# Patient Record
Sex: Female | Born: 1955
Health system: Southern US, Community
[De-identification: ages and names within clinical notes are randomized; demographics above are authoritative.]

## PROBLEM LIST (undated history)

## (undated) DIAGNOSIS — Z5189 Encounter for other specified aftercare: Secondary | ICD-10-CM

## (undated) DIAGNOSIS — Z923 Personal history of irradiation: Secondary | ICD-10-CM

## (undated) DIAGNOSIS — G4730 Sleep apnea, unspecified: Secondary | ICD-10-CM

## (undated) DIAGNOSIS — K219 Gastro-esophageal reflux disease without esophagitis: Secondary | ICD-10-CM

## (undated) DIAGNOSIS — E785 Hyperlipidemia, unspecified: Secondary | ICD-10-CM

## (undated) DIAGNOSIS — E119 Type 2 diabetes mellitus without complications: Secondary | ICD-10-CM

## (undated) DIAGNOSIS — T7840XA Allergy, unspecified, initial encounter: Secondary | ICD-10-CM

## (undated) DIAGNOSIS — I2510 Atherosclerotic heart disease of native coronary artery without angina pectoris: Secondary | ICD-10-CM

## (undated) DIAGNOSIS — C801 Malignant (primary) neoplasm, unspecified: Secondary | ICD-10-CM

## (undated) DIAGNOSIS — E669 Obesity, unspecified: Secondary | ICD-10-CM

## (undated) DIAGNOSIS — I10 Essential (primary) hypertension: Secondary | ICD-10-CM

## (undated) DIAGNOSIS — R011 Cardiac murmur, unspecified: Secondary | ICD-10-CM

## (undated) DIAGNOSIS — Z9221 Personal history of antineoplastic chemotherapy: Secondary | ICD-10-CM

## (undated) DIAGNOSIS — IMO0002 Reserved for concepts with insufficient information to code with codable children: Secondary | ICD-10-CM

## (undated) DIAGNOSIS — C50919 Malignant neoplasm of unspecified site of unspecified female breast: Secondary | ICD-10-CM

## (undated) DIAGNOSIS — IMO0001 Reserved for inherently not codable concepts without codable children: Secondary | ICD-10-CM

## (undated) DIAGNOSIS — F319 Bipolar disorder, unspecified: Secondary | ICD-10-CM

## (undated) DIAGNOSIS — M1990 Unspecified osteoarthritis, unspecified site: Secondary | ICD-10-CM

## (undated) HISTORY — DX: Encounter for other specified aftercare: Z51.89

## (undated) HISTORY — DX: Reserved for concepts with insufficient information to code with codable children: IMO0002

## (undated) HISTORY — PX: ABDOMINAL HYSTERECTOMY: SHX81

## (undated) HISTORY — PX: CARDIAC CATHETERIZATION: SHX172

## (undated) HISTORY — PX: EYE SURGERY: SHX253

## (undated) HISTORY — DX: Type 2 diabetes mellitus without complications: E11.9

## (undated) HISTORY — DX: Unspecified osteoarthritis, unspecified site: M19.90

## (undated) HISTORY — DX: Hyperlipidemia, unspecified: E78.5

## (undated) HISTORY — DX: Essential (primary) hypertension: I10

## (undated) HISTORY — DX: Malignant (primary) neoplasm, unspecified: C80.1

## (undated) HISTORY — PX: CORONARY ANGIOPLASTY: SHX604

## (undated) HISTORY — DX: Reserved for inherently not codable concepts without codable children: IMO0001

## (undated) HISTORY — PX: CHOLECYSTECTOMY: SHX55

## (undated) HISTORY — DX: Bipolar disorder, unspecified: F31.9

---

## 1999-07-19 HISTORY — PX: CORONARY STENT PLACEMENT: SHX1402

## 2001-04-29 ENCOUNTER — Emergency Department (HOSPITAL_COMMUNITY): Admission: EM | Admit: 2001-04-29 | Discharge: 2001-04-29 | Payer: Self-pay | Admitting: Emergency Medicine

## 2001-05-17 ENCOUNTER — Encounter: Admission: RE | Admit: 2001-05-17 | Discharge: 2001-08-15 | Payer: Self-pay | Admitting: Internal Medicine

## 2001-08-15 ENCOUNTER — Encounter: Admission: RE | Admit: 2001-08-15 | Discharge: 2001-11-13 | Payer: Self-pay | Admitting: Internal Medicine

## 2001-11-11 ENCOUNTER — Encounter: Payer: Self-pay | Admitting: Emergency Medicine

## 2001-11-11 ENCOUNTER — Inpatient Hospital Stay (HOSPITAL_COMMUNITY): Admission: EM | Admit: 2001-11-11 | Discharge: 2001-11-13 | Payer: Self-pay | Admitting: Emergency Medicine

## 2001-11-17 ENCOUNTER — Inpatient Hospital Stay (HOSPITAL_COMMUNITY): Admission: EM | Admit: 2001-11-17 | Discharge: 2001-11-22 | Payer: Self-pay | Admitting: Emergency Medicine

## 2001-11-17 ENCOUNTER — Encounter: Payer: Self-pay | Admitting: Cardiology

## 2001-11-20 ENCOUNTER — Encounter: Payer: Self-pay | Admitting: Cardiovascular Disease

## 2001-11-27 ENCOUNTER — Encounter: Admission: RE | Admit: 2001-11-27 | Discharge: 2002-02-25 | Payer: Self-pay | Admitting: Internal Medicine

## 2001-11-27 ENCOUNTER — Encounter (HOSPITAL_COMMUNITY): Admission: RE | Admit: 2001-11-27 | Discharge: 2002-02-25 | Payer: Self-pay | Admitting: Cardiovascular Disease

## 2001-12-04 ENCOUNTER — Emergency Department (HOSPITAL_COMMUNITY): Admission: EM | Admit: 2001-12-04 | Discharge: 2001-12-04 | Payer: Self-pay | Admitting: Emergency Medicine

## 2001-12-14 ENCOUNTER — Encounter: Admission: RE | Admit: 2001-12-14 | Discharge: 2002-03-14 | Payer: Self-pay | Admitting: Internal Medicine

## 2002-01-30 ENCOUNTER — Ambulatory Visit (HOSPITAL_COMMUNITY): Admission: RE | Admit: 2002-01-30 | Discharge: 2002-01-30 | Payer: Self-pay | Admitting: Neurology

## 2002-01-30 ENCOUNTER — Encounter: Payer: Self-pay | Admitting: Neurology

## 2002-02-21 ENCOUNTER — Encounter: Admission: RE | Admit: 2002-02-21 | Discharge: 2002-05-22 | Payer: Self-pay | Admitting: Otolaryngology

## 2002-02-26 ENCOUNTER — Encounter (HOSPITAL_COMMUNITY): Admission: RE | Admit: 2002-02-26 | Discharge: 2002-05-27 | Payer: Self-pay | Admitting: Cardiovascular Disease

## 2002-03-04 ENCOUNTER — Inpatient Hospital Stay (HOSPITAL_COMMUNITY): Admission: EM | Admit: 2002-03-04 | Discharge: 2002-03-06 | Payer: Self-pay | Admitting: Emergency Medicine

## 2002-03-04 ENCOUNTER — Encounter: Payer: Self-pay | Admitting: *Deleted

## 2002-03-12 ENCOUNTER — Encounter: Admission: RE | Admit: 2002-03-12 | Discharge: 2002-04-11 | Payer: Self-pay | Admitting: Internal Medicine

## 2002-05-30 ENCOUNTER — Encounter: Payer: Self-pay | Admitting: Emergency Medicine

## 2002-05-30 ENCOUNTER — Observation Stay (HOSPITAL_COMMUNITY): Admission: EM | Admit: 2002-05-30 | Discharge: 2002-05-31 | Payer: Self-pay | Admitting: Emergency Medicine

## 2002-07-30 ENCOUNTER — Emergency Department (HOSPITAL_COMMUNITY): Admission: EM | Admit: 2002-07-30 | Discharge: 2002-07-30 | Payer: Self-pay | Admitting: Emergency Medicine

## 2002-07-30 ENCOUNTER — Encounter: Payer: Self-pay | Admitting: Emergency Medicine

## 2002-08-16 ENCOUNTER — Encounter: Payer: Self-pay | Admitting: Internal Medicine

## 2002-08-16 ENCOUNTER — Encounter: Admission: RE | Admit: 2002-08-16 | Discharge: 2002-08-16 | Payer: Self-pay | Admitting: Internal Medicine

## 2002-09-06 ENCOUNTER — Encounter: Payer: Self-pay | Admitting: Emergency Medicine

## 2002-09-06 ENCOUNTER — Inpatient Hospital Stay (HOSPITAL_COMMUNITY): Admission: EM | Admit: 2002-09-06 | Discharge: 2002-09-07 | Payer: Self-pay | Admitting: Emergency Medicine

## 2003-05-14 ENCOUNTER — Emergency Department (HOSPITAL_COMMUNITY): Admission: EM | Admit: 2003-05-14 | Discharge: 2003-05-14 | Payer: Self-pay | Admitting: Emergency Medicine

## 2003-10-08 ENCOUNTER — Encounter: Admission: RE | Admit: 2003-10-08 | Discharge: 2003-10-08 | Payer: Self-pay | Admitting: Internal Medicine

## 2003-12-31 ENCOUNTER — Ambulatory Visit (HOSPITAL_COMMUNITY): Admission: RE | Admit: 2003-12-31 | Discharge: 2003-12-31 | Payer: Self-pay | Admitting: Orthopedic Surgery

## 2003-12-31 ENCOUNTER — Ambulatory Visit (HOSPITAL_BASED_OUTPATIENT_CLINIC_OR_DEPARTMENT_OTHER): Admission: RE | Admit: 2003-12-31 | Discharge: 2003-12-31 | Payer: Self-pay | Admitting: Orthopedic Surgery

## 2004-10-20 ENCOUNTER — Encounter: Admission: RE | Admit: 2004-10-20 | Discharge: 2004-10-20 | Payer: Self-pay | Admitting: Internal Medicine

## 2004-12-13 ENCOUNTER — Emergency Department (HOSPITAL_COMMUNITY): Admission: EM | Admit: 2004-12-13 | Discharge: 2004-12-14 | Payer: Self-pay | Admitting: Emergency Medicine

## 2004-12-14 ENCOUNTER — Emergency Department (HOSPITAL_COMMUNITY): Admission: EM | Admit: 2004-12-14 | Discharge: 2004-12-14 | Payer: Self-pay | Admitting: Emergency Medicine

## 2005-03-10 ENCOUNTER — Observation Stay (HOSPITAL_COMMUNITY): Admission: EM | Admit: 2005-03-10 | Discharge: 2005-03-11 | Payer: Self-pay | Admitting: Emergency Medicine

## 2005-04-06 ENCOUNTER — Emergency Department (HOSPITAL_COMMUNITY): Admission: EM | Admit: 2005-04-06 | Discharge: 2005-04-07 | Payer: Self-pay | Admitting: Emergency Medicine

## 2005-10-21 ENCOUNTER — Encounter: Admission: RE | Admit: 2005-10-21 | Discharge: 2005-10-21 | Payer: Self-pay | Admitting: Internal Medicine

## 2007-01-29 ENCOUNTER — Encounter: Admission: RE | Admit: 2007-01-29 | Discharge: 2007-01-29 | Payer: Self-pay | Admitting: Internal Medicine

## 2008-03-21 ENCOUNTER — Encounter: Admission: RE | Admit: 2008-03-21 | Discharge: 2008-03-21 | Payer: Self-pay | Admitting: Internal Medicine

## 2009-04-03 ENCOUNTER — Encounter: Admission: RE | Admit: 2009-04-03 | Discharge: 2009-04-03 | Payer: Self-pay | Admitting: Internal Medicine

## 2009-05-08 ENCOUNTER — Encounter (INDEPENDENT_AMBULATORY_CARE_PROVIDER_SITE_OTHER): Payer: Self-pay | Admitting: Internal Medicine

## 2009-05-08 ENCOUNTER — Encounter: Admission: RE | Admit: 2009-05-08 | Discharge: 2009-05-08 | Payer: Self-pay | Admitting: Internal Medicine

## 2009-05-08 HISTORY — PX: BREAST BIOPSY: SHX20

## 2010-07-30 ENCOUNTER — Encounter
Admission: RE | Admit: 2010-07-30 | Discharge: 2010-07-30 | Payer: Self-pay | Source: Home / Self Care | Attending: Internal Medicine | Admitting: Internal Medicine

## 2010-12-03 NOTE — Discharge Summary (Signed)
NAME:  Kathy Maxwell, Kathy Maxwell NO.:  [id number]   MEDICAL RECORD NO.:  [id number]                   PATIENT TYPE:  INP   LOCATION:  3733                                 FACILITY:  [facility]   PHYSICIAN:  Richard A. Alanda Amass, M.D.          DATE OF BIRTH:  16-Nov-1955   DATE OF ADMISSION:  03/04/2002  DATE OF DISCHARGE:  03/06/2002                                 DISCHARGE SUMMARY   DISCHARGE DIAGNOSES:  1. Chest pain consistent with unstable angina, no restenosis of previously     placed LAD Stent by catheterization this admission.  2. Coronary artery disease, LAD cipher Stent placed in April of 2003 with     abnormal Cardiolite in June of 2003.  3. Breast attenuation versus ischemia.  4. Controlled hypertension.  5. Treated hyperlipidemia.  6. Non-insulin dependent diabetes mellitus.  7. Bipolar disorder.   HOSPITAL COURSE:  The patient is a 55 year old female followed by Dr.  Alanda Amass and Dr. Allyne Gee with a history of LAD stenting in April of 2003.  Follow-up Cardiolite study in June of 2003 was abnormal with suggestion of  anterior wall ischemia versus breast attenuation. At that time, she was  doing well and we continued her on medical therapy. She presented to the  office on March 04, 2002 with complaints of chest pain, described as sharp,  off and on for three weeks. She had pain at cardiac rehabilitation and was  referred to [name] by cardiac rehabilitation. She took Nitroglycerin times two  with eventual relief. She says that her symptoms are similar to what she had  had pre-PCI in April.   HOSPITAL COURSE:  The patient was seen by Dr. Jenne Campus and myself and  admitted to [facility] and started on Heparin and Nitroglycerin.  Set up for a re-study. CK, MB and troponin's were negative. She underwent  diagnostic catheterization on March 05, 2002 by Dr. Tresa Endo which revealed  normal RCA, normal circumflex, 40% mid LAD narrowing with intravascular  bridging and no re-stenosis of the distal LAD Stent site. LV function was  normal. Dr. Tresa Endo suggested continuing nitrates with intramyocardial  bridging. She is for discharge on March 06, 2002.   DISCHARGE MEDICATIONS:  1. Imdur 30 mg per day.  2. Toprol XL 37.5 mg daily.  3. Lamictal 200 mg bid.  4. Wellbutrin 150 mg bid.  5. Zocor 40 mg HS.  6. Nexium 40 mg daily.  7. Actos 45 mg daily.  8. Aspirin 81 mg daily.  9. Seroquel 400 mg at HS.  10.      Glucophage 750 mg daily.   LABORATORY DATA:  Sodium 140, potassium 3.6, BUN 13, creatinine 0.9, TSH  0.65. CK, MB and troponin's were negative. Lipid profile reveals a total  cholesterol of 161, HDL of 57, LDL 88, white count 5.9, hemoglobin 11.2 and  hematocrit 33.8. Platelet count 297. INR 0.9.   DIAGNOSTIC STUDIES:  EKG revealed sinus rhythm with first degree AV block  and nonspecific ST changes.   DISPOSITION:  The patient was discharged in stable condition and will follow-  up with Dr Alanda Amass in a few months.     Abelino Derrick, P.A.                      Richard A. Alanda Amass, M.D.    Lenard Lance  D:  03/06/2002  T:  03/08/2002  Job:  [id number]   cc:   Gerlene Burdock A. Alanda Amass, M.D.   Candyce Churn. Allyne Gee, M.D.

## 2010-12-03 NOTE — Discharge Summary (Signed)
NAME:  Kathy Maxwell, Kathy Maxwell                          ACCOUNT NO.:  [id number]   MEDICAL RECORD NO.:  [id number]                   PATIENT TYPE:  INP   LOCATION:  4735                                 FACILITY:  [facility]   PHYSICIAN:  Richard A. Alanda Amass, M.D.          DATE OF BIRTH:  March 11, 1956   DATE OF ADMISSION:  DATE OF DISCHARGE:  09/07/2002                                 DISCHARGE SUMMARY   DISCHARGE DIAGNOSES:  1. Chest pain, noncardiac.  2. Coronary artery disease.  3. Gastroesophageal reflux disease.  4. Diabetes mellitus.  5. Hypertension.  6. Bipolar disorder.  7. Dyslipidemia.  8. Sleep apnea with positive sleep study.   CONDITION ON DISCHARGE:  Improved.   PROCEDURES:  None.   DISCHARGE MEDICATIONS:  1. Chlorthalidone 25 mg every other day.  2. Potassium chloride 10 mEq every other day.  3. Wellbutrin SR 150 mg b.i.d.  4. Seroquel 400 mg q.h.s.  5. Glucophage XL 750 mg daily.  6. Aspirin 81 mg daily.  7. Imdur 60 mg 1 daily.  8. Actos 45 mg daily.  9. Nexium 40 mg daily.  10.      Toprol  XL 37.5 mg daily.  11.      Zocor 40 mg daily.  12.      Remittor 200 mg b.i.d.  13.      Nitroglycerin p.r.n. chest pain.  14.      Take 2 potassium pills tonight at supper 1 time only.   DISCHARGE INSTRUCTIONS:  No strenuous activity until you see Dr. Alanda Amass.  Low fat, low salt diabetic diet.   FOLLOW UP:  Followup with Dr. Alanda Amass in 2 to 3 weeks, call for  appointment date and time.   HISTORY OF PRESENT ILLNESS:  The patient is a 55 year old African American  female with a prior history of coronary disease including a Cypher stent to  the LAD in April 2003 and a repeat catheterization in August 2003 revealed  no restenosis. She came to the emergency room on September 06, 2002, after  having  a few episodes of nitroglycerin responsive chest pain associated  with nausea. The night  prior to  admission she had this. The morning of  admission she had 2 episodes of  chest pain associated with shortness of  breath but no radiation or dizziness. She states it felt very much like the  pain she had in August 2003 when her stent was put in. She was admitted for  observation at [facility] to rule out MI.   PAST MEDICAL HISTORY:  1. Cypher stent as stated.  2. Type 2 diabetes mellitus.  3. Gastroesophageal reflux disease.  4. Hypertension.  5. Dyslipidemia.  6. Bipolar.   For family history, social history, review of systems see history and  physical.   OUTPATIENT MEDICATIONS:  1. Chlorthalidone.  2. Potassium.  3. Wellbutrin.  4. Seroquel.  5. Glucophage.  6. Aspirin.  7. Imdur.  8. Actos.  9. Nexium.  10.      Toprol.  11.      Zocor.  12.      Limitrol.   CONDITION ON DISCHARGE:  Vital signs at discharge:  Blood pressure 124/76,  pulse 60, respirations 16, temperature 97.2. Lungs are clear. Heart sounds,  S1, S2, regular rate and rhythm.   LABORATORY DATA:  Hemoglobin on admission 11.0, hematocrit 34.8, WBC 5.4,  MCV 90, platelets 307, neutrophils 53, lymphocytes 40, monocytes 5,  eosinophils 3, basophils 1. Pro time 13.4, INR 1, PTT 33. Sodium 140,  potassium 3.4, chloride 105, CO2 28, glucose 122, BUN 13, creatinine 0.9,  calcium 9.5. Total protein 6.8, albumin 3.6, AST 15, ALT 21, ALP 100, total  bilirubin 0.4. Potassium prior to  discharge had been 3.2. She was given  extra potassium and asked to take extra potassium on the night of discharge.  CK-MB 1 to 2, 103, 98. MB 1.5, 1.2, 1.1. Troponin 0.01 to less than  0.01,  all negative for MI.   A chest x-ray suboptimal inspiration, no active disease. An EKG, sinus  rhythm, first degree AV block, PR was 304 msec, nonspecific T-wave  abnormality.   HOSPITAL COURSE:  The patient was admitted by Dr. Tresa Endo on September 06, 2002, after having nitroglycerin responsive chest pain. She was admitted and  placed on Vioxx, Protonix and just observed overnight. By the next day she   had no further chest pain. Her potassium was replaced. She walked in the  hall without chest pain and was ready  for discharge. She was very sleepy on original discharge. When she woke up  and could walk in the hall without problems she was discharged to home.  She was to follow up as stated.     Darcella Gasman. [place], N.P.                     Richard A. Alanda Amass, M.D.    LRI/MEDQ  D:  10/18/2002  T:  10/21/2002  Job:  [id number]   cc:   Candyce Churn. Allyne Gee, M.D.  [address]  [address]  [place]  [place] [address]  Fax: [phone]

## 2010-12-03 NOTE — Cardiovascular Report (Signed)
[place]. [facility]  Patient:    Kathy Maxwell, Kathy Maxwell Visit Number: [id number] MRN: [id number]          Service Type: MED Location: [phone] Attending Physician:  Ruta Hinds Dictated by:   Madaline Savage, M.D. Proc. Date: 11/12/01 Admit Date:  11/12/2001   CC:         Cala Bradford R. Renae Gloss, M.D.  Cardiopulmonary Laboratory   Cardiac Catheterization  PROCEDURES PERFORMED: 1. Selective coronary angiography by Judkins technique. 2. Retrograde left heart catheterization. 3. Left ventricular angiography. 4. Abdominal aortography. 5. Direct coronary artery stenting to the distal left anterior descending.  COMPLICATIONS: None.  ENTRY SITE: Right femoral.  DYE USED: Omnipaque.  PATIENT PROFILE: The patient is a 55 year old, African American female with a family history of coronary artery disease in a younger sibling, who had myocardial infarction, who presented with onset of chest pain approximately two to three days before admission. Cardiac enzymes were negative. ECG showed normal sinus rhythm with left ventricular hypertrophy and nonspecific ST-T abnormalities. The patient has been diabetic since 1995. She is also obese. She has had elevated cholesterol. The patient was transferred from [facility] to [facility] today for diagnostic cardiac catheterization, which ultimately led to a coronary artery intervention.  RESULTS:  PRESSURES: The left ventricular pressure was 95/15, central aortic pressure 95/60, mean of 75. No aortic valve gradient by pullback techniques.  ANGIOGRAPHIC RESULTS: The left main coronary artery was normal and fairly short.  The LAD coursed to the cardiac apex near the origin of a first septal perforator branch, a diagonal branch arose which was medium in size. No lesions were seen in the diagonal or in the proximal LAD. The mid LAD contained a 40% discrete lesion that was very smooth. The distal  LAD contained an 80% eccentric stenosis about 25 mm away from the bifurcation of the LAD at the apex. This was a eccentric lesion about 10 mm in length.  The circumflex coronary artery was nondominant. It gave rise to a first obtuse marginal branch which bifurcated in its midportion. The circumflex and the OM showed no significant lesions.  The right coronary artery was a dominant vessel. It contained a 40% stenosis in the midportion of the vessel near an acute marginal branch. The distal RCA was normal. The posterior descending and the posterolateral branch were both normal.  The left ventricle showed low-normal contractility of all wall segments with an ejection fraction estimated of 50%. There was no LV thrombus and no mitral regurgitation seen. Abdominal aortography showed both renal arteries to be widely patent, the abdominal aorta to be smooth and no significant pathology was seen.  INTERVENTIONAL PROCEDURE: The interventional procedure was performed using a 3.0, 7 French left Judkins guide catheter without side holes. The guide wire utilized was a 0.014 Patriot guide wire. There was no pre-dilatation balloon. A 2.5 x 18 mm Cypher RX Cordis stent was placed in the distal LAD and I was careful to place the ends of the stent on areas of arterial vessel wall, which appeared smooth and normal in appearance. I then deployed the stent balloon twice to 14 atmospheres each time. The first time for a minute and 15 seconds. The second time for 45 seconds. No ST segment changes occurred. The vessel was then infused with 200 mcg of intracoronary nitroglycerin to dilate it maximally and observe the response. The 80% lesion eccentrically in the distal LAD was reduced to 0% residual. There was a  smooth lumen throughout the vessel distally, and the result was considered very successful. The 80% lesion was reduced to 0% residual.  MEDICATION GIVEN DURING THE PROCEDURE: Fentanyl for sedation,  intravenous ReoPro for platelet stabilization. The patient will be given Plavix and was also given regular insulin subcutaneous 10 units after her blood sugar was found to be in excess of 400. The last blood sugar was 318.  FINAL DIAGNOSES: 1. Two-vessel coronary artery disease.    a. An 80% distal left anterior descending stenosis.    b. A 40% mid left anterior descending stenosis.    c. A 40% mid right coronary artery stenosis. 2. Low-normal left ventricular systolic function, 50% ejection fraction. 3. Normal renal arteries and normal abdominal aorta. 4. Successful stenting of distal left anterior descending with a coated    sirolimus Cypher stent by Cordis.  PLAN: The patient will need Plavix for two months and her blood sugar will be addressed this evening and tomorrow before discharge. She will receive ReoPro for a total of 16 hours. Dictated by:   Madaline Savage, M.D. Attending Physician:  Ruta Hinds DD:  11/12/01 TD:  11/13/01 Job: [id number]

## 2010-12-03 NOTE — Discharge Summary (Signed)
[place]. [facility]  Patient:    Kathy Maxwell, Kathy Maxwell Visit Number: [id number] MRN: [id number]          Service Type: MED Location: [phone] Attending Physician:  Ruta Hinds Dictated by:   Abelino Derrick, P.A.C. Admit Date:  11/12/2001 Discharge Date: 11/13/2001                             Discharge Summary  DISCHARGE DIAGNOSES: 1. Unstable angina, elective left anterior descending artery stenting this    admission. 2. Hypertension, controlled. 3. Hyperlipidemia, treated. 4. Type 2 diabetes. 5. Bipolar disorder.  HOSPITAL COURSE:  The patient is a 55 year old female followed by Dr. Allyne Gee who was admitted on November 11, 2001 with chest pain consistent with unstable angina  Enzymes were initially negative.  EKG shows LVH and nonspecific ST changes.  She was treated for unstable angina with IV heparin and nitrates. She was set up for catheterization which was done November 12, 2001, by Dr. Elsie Lincoln.  This reveals 40% RCA narrowing, normal circumflex, normal OM, normal left main, and an 80% mid LAD that was dilated and stented.  Renal arteries, iliac arteries, and aorta were within normal limits.  She tolerated the procedure well.  We fell she can be discharged November 13, 2001.  The patient was transferred to Dr. Reino Kent service as of November 12, 2001.  DISCHARGE MEDICATIONS:  1. Seroquel 400 mg h.s.  2. Plavix 75 mg a day for 4 weeks.  3. Coated aspirin q.d.  4. GlucoVance 5/500 two tablets twice a day.  This will be held until the     patient follows up with Dr. Allyne Gee on Thursday, May 1.  5. Glucotrol XL 10 mg a day.  She is to take this until she sees Dr. Allyne Gee.  6. Zocor 20 mg a day.  7. Nexium 40 mg a day.  8. Accupril 20 mg a day.  9. Actos 30 mg twice a day. 10. Estratest h.s. 11. ______  200 mg twice a day. 12. Triamterene/hydrochlorothiazide once a day. 13. Wellbutrin twice a day. 14. Nitroglycerin sublingual  p.r.n.  LABORATORY DATA:  Sodium 136, potassium 4.2, BUN 10, creatinine 0.8.  White count 5.6, hemoglobin 12, hematocrit 35.3, platelets unable to be estimated because of clumps on smears, but overall platelet count appeared to be adequate.  Troponin and CK were negative.  Liver function normal.  EKG shows sinus rhythm, first degree A-V block with a PR interval of 250.  DISPOSITION:  The patient is discharged in stable condition and will follow up with Dr. Alanda Amass, who saw her initially in consult, May 22 at 4:15 p.m.  She has an appointment with Dr. Allyne Gee on May 1 at 10 a.m. for BMET.  She will hold GlucoVance until she is seen by Dr. Allyne Gee and in the interim take Glucotrol XL 10 mg a day.  Beta blocker was not started; her overall heart rate was in the 60s, and she had a significant first degree A-V block. Dictated by:   Abelino Derrick, P.A.C. Attending Physician:  Ruta Hinds DD:  11/13/01 TD:  11/13/01 Job: [id number]

## 2010-12-03 NOTE — H&P (Signed)
NAME:  BRITTINEY, DICOSTANZO NO.:  [id number]   MEDICAL RECORD NO.:  [id number]          PATIENT TYPE:  INP   LOCATION:  1823                         FACILITY:  [facility]   PHYSICIAN:  Ulyses Amor, MD DATE OF BIRTH:  Oct 29, 1955   DATE OF ADMISSION:  03/10/2005  DATE OF DISCHARGE:                                HISTORY & PHYSICAL   HISTORY OF PRESENT ILLNESS:  Kathy Maxwell is a 55 year old black woman who  is admitted to [facility] for further evaluation of chest pain.   The patient has a history of coronary artery disease, which dates back to  April of 2003.  At that time, she underwent stenting of an LAD lesion.  In  August of 2003, she returned for chest pain.  Cardiac catheterization  demonstrated that the stent was patent.  There was a 40% mid LAD lesion with  intravascular bridging noted.  Continued medical therapy was advised.   The patient presented to the emergency department this evening after  experiencing intermittent chest pain, which began approximately 12 hours  ago.  She was at home at the time.  The chest pain is described as an ache  in a focal region in the mid substernal.  It has radiated to the right  shoulder.  The chest pain has occurred intermittently over these 12 hours,  lasting for several minutes, resolving, and then returning for several more  minutes.  There appear to be no exacerbating or ameliorating factors.  The  chest pain is unrelated to position, activity, meals, or respirations.  There was no associated dyspnea or diaphoresis, but she has noted some  nausea.  Nitroglycerin given by EMS seemed to help her pain.  Her chest pain  has diminished, though not completely resolved, since her arrival in the  emergency department.   There is no history of congestive heart failure or arrhythmia.   RISK FACTORS:  The patient has a number of risk factors for coronary artery  disease, including hypertension, dyslipidemia,  noninsulin-dependent diabetes  mellitus, and a family history of coronary artery disease (mother and  sister).  There is no history of smoking.   The patient's only other medical problem is bipolar disorder.   MEDICATIONS:  1.  Wellbutrin.  2.  Actos.  3.  Seroquel.  4.  Nexium.  5.  Toprol-XL.  6.  Imdur.  7.  Nitroglycerin.  8.  Lexapro.   ALLERGIES:  None.   OPERATIONS:  Gallbladder surgery.   SOCIAL HISTORY:  The patient lives alone.  She does not work.  She does not  smoke cigarettes, as noted above.   FAMILY HISTORY:  Her mother died in her 59s of complications of diabetes and  CAD.  Her sister died in her 40s of a myocardial infarction.   REVIEW OF SYSTEMS:  No new problems related to her head, eyes, ears, nose,  mouth, throat, lungs, gastrointestinal system, genitourinary system, or  extremities.  There is no history of neurologic disorder.  There is no  history of fever, chills, or weight loss.   PHYSICAL EXAMINATION:  VITAL SIGNS:  Blood pressure 152/71, pulse 93 and  regular, respirations 24, temperature 98.4.  GENERAL:  The patient was an obese, middle-aged black woman in no  discomfort.  She was alert, oriented, appropriate, and responsive.  HEENT:  Normal.  NECK:  Without thyromegaly or adenopathy.  Carotid pulses were palpable  bilaterally and without bruits.  CARDIAC:  A normal S1 and S2.  There was no S3, S4, murmur, rub, or click.  Cardiac rhythm was regular.  CHEST:  Palpation of the sternum reproduced the patient's chest pain.  LUNGS:  Clear.  ABDOMEN:  Soft and nontender.  There was no mass, hepatosplenomegaly, bruit,  distention, rebound, guarding, or rigidity.  Bowel sounds were normal.  BREASTS/PELVIC/RECTAL:  Not performed, as they were not pertinent to the  reason for acute care hospitalization.  EXTREMITIES:  Without edema, deviation, or deformity.  Radial and dorsalis  pedis pulses were palpable bilaterally.  NEUROLOGIC:  Brief screening  neurologic survey was unremarkable.   The electrocardiogram revealed normal sinus rhythm.  The possibility of a  prior septal myocardial infarction could not be excluded.  There was a  nonspecific ST-T wave abnormality.  The chest radiograph, according to the  radiologist, demonstrated no evidence of acute disease.  The first set of  cardiac markers revealed a CK-MB of less than 1.0, myoglobin 37.3, and  troponin less than 0.05.  The second set of cardiac markers revealed a CK-MB  of less than 1.0, myoglobin 31.4, and troponin less than 0.05.  The third  set revealed a CK-MB of less than 1.0, myoglobin 26.9, and troponin less  than 0.05.  Potassium is 3.7, BUN 15, and creatinine 0.7.  The remaining  studies were pending at the time of this dictation.   IMPRESSION:  1.  Chest pain, rule out cardiac ischemia.  2.  Coronary artery disease, status post LAD stent in April 2003.  Her last      cardiac catheterization, performed in August of 2003, demonstrated a      patent stent, a 40% mid LAD lesion with intravascular bridging, and      normal left ventricular function.  3.  Hypertension.  4.  Dyslipidemia.  5.  Noninsulin-dependent diabetes mellitus.  6.  Bipolar disorder.   PLAN:  1.  Telemetry.  2.  Serial cardiac enzymes.  3.  Aspirin.  4.  Intravenous nitroglycerin.  5.  Intravenous heparin.  6.  Toprol-XL.  7.  Further measures per Dr. Alanda Amass.      Ulyses Amor, MD  Electronically Signed     MSC/MEDQ  D:  03/10/2005  T:  03/10/2005  Job:  [id number]   cc:   Gerlene Burdock A. Alanda Amass, M.D.  [phone]  [place]  [place] [address]  Fax: [phone]

## 2010-12-03 NOTE — H&P (Signed)
NAME:  Kathy Maxwell, Kathy Maxwell NO.:  [id number]   MEDICAL RECORD NO.:  [id number]                   PATIENT TYPE:  INP   LOCATION:  3733                                 FACILITY:  [facility]   PHYSICIAN:  Abelino Derrick, P.A.                DATE OF BIRTH:  28-Nov-1955   DATE OF ADMISSION:  03/04/2002  DATE OF DISCHARGE:  03/06/2002                                HISTORY & PHYSICAL   ATTENDING PHYSICIAN:  Dr. Darlin Priestly   DATE OF ADMISSION:  March 04, 2002   CHIEF COMPLAINT:  Chest pain.   HISTORY OF PRESENT ILLNESS:  The patient is a 55 year old female followed by  Dr. Madaline Savage and Dr. Dorothyann Peng, with a history of coronary  artery disease.  She had an LAD stent placed in April 2003.  This was a  Cypher stent.  She was on Plavix for one month.  She had a Cardiolite study  in June 2003, which was abnormal, with a suggestion of anterior wall  ischemia versus breast attenuation.  At that time she was doing well  symptomatically.  She is seen in our office today with complaints of chest  pain described as sharp.  She has been having symptoms actually for about  three weeks.  She said she had pain last night that woke her up.  She had  recurrent pain today while in cardiac rehab, and took two nitroglycerin with  eventual relief.  Her pain is somewhat atypical, but she thinks it is  similar to what she has had before.  She was seen by Dr. Jenne Campus and by  myself today in the office, and admitted for further evaluation.   PAST MEDICAL HISTORY:  1. Remarkable for non-insulin-dependent diabetes.  2. She has a history of vertigo in the past.  3. She has treated hyperlipidemia.  4. Hypertension.  5. She is being treated for bipolar disorder by Dr. Curlene Dolphin.  6. She is status post gallbladder surgery in the past.   MEDICATIONS:  1. Toprol XL 37.5 mg q.d.  2. Lamictal 200 mg b.i.d.  3. Wellbutrin 150 mg b.i.d.  4. Zocor 40 mg  q.h.s.  5. Nexium 40 mg q.d.  6. Actos 45 mg q.d.  7. Aspirin 81 mg q.d.  8. Seroquel 400 mg q.h.s.  9. Glucophage 750 mg q.d.   ALLERGIES:  No known drug allergies.   SOCIAL HISTORY:  She is a nonsmoker.  She is single with no children.   FAMILY HISTORY:  Her mother died in her 87s, complications of diabetes and  coronary artery disease.  She has a sister who died in her 81s of a MI.   REVIEW OF SYSTEMS:  Essentially unremarkable except for noted above.  She  has had some intermittent dizziness.   PHYSICAL EXAMINATION:  VITAL SIGNS:  Blood pressure 144/76, pulse 80, respirations 16.  GENERAL:  She is a well-developed overweight female, in no acute distress.  HEENT:  Normocephalic.  Extraocular movements intact.  Sclerae anicteric.  Lids and conjunctivae within normal limits.  NECK:  Without bruits, without JVD.  CHEST:  Clear to auscultation and percussion.  CARDIAC:  A regular rate and rhythm with a 1-2/6 midsystolic murmur at the  aortic valve area and left sternal border.  She has no rub or S3 or S4.  ABDOMEN:  Nontender, no hepatosplenomegaly is appreciated.  EXTREMITIES:  Without edema.  Distal pulses are intact.  NEUROLOGICAL:  Grossly intact.  She is awake, alert, oriented, and  cooperative.  She moves all extremities without obvious deficit.  SKIN:  Warm and dry.   EKG reveals a sinus rhythm, first-degree AV block, nonspecific ST changes.   IMPRESSION:  1. Unstable angina.  2. Coronary artery disease, left anterior descending coronary artery Cypher     stent placed in April 2003, with follow-up abnormal Cardiolite study, but     no symptoms in June 2003.  3. Controlled hypertension.  4. Treated hyperlipidemia.  She is due for a recheck of her lipids.  The     last one was in May.  5. Non-insulin-dependent diabetes.  6. Bipolar disorder.   PLAN:  She is to be admitted to telemetry and started on heparin and  nitroglycerin, and then set up for a restudy.  She was  seen by Dr. Jenne Campus  and by myself today in the office.                                                Abelino Derrick, P.ALenard Lance  D:  03/04/2002  T:  03/06/2002  Job:  [id number]

## 2010-12-03 NOTE — Cardiovascular Report (Signed)
NAME:  [name], Maxwell NO.:  [id number]   MEDICAL RECORD NO.:  [id number]          PATIENT TYPE:  INP   LOCATION:  3733                         FACILITY:  [facility]   PHYSICIAN:  Richard A. Alanda Amass, M.D.DATE OF BIRTH:  28-Nov-1955   DATE OF PROCEDURE:  03/10/2005  DATE OF DISCHARGE:                              CARDIAC CATHETERIZATION   PROCEDURE:  Retrograde central aortic catheterization, selective coronary  angiography via Judkins technique, pre- and post-IC nitroglycerin  administration, LV angiogram, RAO/LAO projection, abdominal aortic  angiogram, midstream PA projection.   BRIEF HISTORY:  Kathy Maxwell is a 55 year old single African-American woman who has  no children.  She is a nonsmoker, has exogenous obesity, AODM, and bipolar  disorder.  She used to work at [employer] but is currently disabled.  She is under  the medical care of Dr. Dorothyann Peng.  She is on medical therapy including  beta blockers, Nexium for GERD, diabetic therapy, antidepressant (Lexapro),  Wellbutrin, Actos, Seroquel.  Lipid status is unknown at present, and she is  apparently not taking lipid-lowering therapy.  She has a prior family  history of coronary disease, with risk factors as outlined above, and  underwent distal LAD DES stenting by Dr. Elsie Lincoln in April 2003, with a 2.5/18  CYPHER stent placed for single-vessel disease.  She subsequently underwent  recatheterization August 2003 for chest discomfort, with no restenosis, and  normal LV function.  She was admitted by Dr. Lemmie Evens for chest pain  compatible with ischemia, and myocardial infarction was ruled out by serial  enzymes and EKGs.  Her chest pain was substernal and left precordial.  She  has also had some reflux symptoms.  She was referred for repeat  catheterization in this setting.  She has also had remote cholecystectomy.   PROCEDURE:  The patient was brought to the second floor CP lab in a  postabsorptive state after 5 mg  Valium p.o. premedication.  One percent  Xylocaine was used for local anesthesia.  The patient was hydrated  preoperatively.  The RCFA was entered with a single anterior puncture using  an 18 thin-wall needle, and with modified Seldinger technique a short 6  [place] __________ side-arm sheath was inserted without difficulty.  Diagnostic coronary angiography was done with 6 French 4 cm taper preformed  Cordis coronary and pigtail catheters.  IC nitroglycerin 200 mcg was given  into the left coronary artery, with repeat injections obtained.  LV  angiogram was done in the RAO and LAO projection at 25 cc, 14 cc/second, and  20 cc, 12 cc/second, respectively.  Pullback pressure to the CA was  performed and showed no gradient across the aortic valve.  Abdominal aortic  angiogram was done in the midstream PA projection above the level of the  renal arteries at 20 cc, 20 cc/second, with visualization to the mid-iliacs  bilaterally.  Catheter was removed.  Side-arm sheath was flushed.  Because  of significant hypertension with pressures persistent, 190-195 mmHg, she was  given 20 mg of labetalol IV.  She tolerated this well, and blood pressure  came down to 155-160  mmHg.  She was transferred to the holding area for  sheath removal and pressure hemostasis in stable condition.  The patient was  given 25 mcg of fentanyl IV in the lab for sedation.   PRESSURES:  LV:  190/0; LVEDP 16-18 mmHg.  CA:  190/87 mmHg.  No gradient across the aortic valve on catheter pullback   Fluoroscopy did not show any significant coronary, intracardiac, or valvular  calcification.  The previously placed distal LAD stent was visualized  fluoroscopically.   LV angiogram demonstrated a normally contracting ventricle, with EF greater  than 55%.  No segmental wall motion abnormalities present.  Angiographic LVH  was present.  There was no mitral regurgitation seen.   Abdominal angiogram demonstrated normal celiac and SMA  access.  The renal  arteries are single and normal bilaterally.  The infrarenal abdominal aorta  had no significant atherosclerotic disease, no aneurysm or stenosis, and the  common external iliacs appeared normal.  Hypogastrics were intact  bilaterally.   The main left coronary was normal.   The left anterior descending artery had irregularities in the proximal  third.  There was approximately 30% or less narrowing after the first  diagonal branch and after the first septal perforator.   The previously placed distal LAD stent at the junction of the distal third  had less than 20% renarrowing, with good residual lumen, good flow, and no  significant stenosis.  There were irregularities of the mid- and distal LAD,  with no significant stenosis.   The circumflex artery was nondominant.  The first marginal was small and  normal.  The second marginal had a 70% ostial lesion and was small.  The  third marginal bifurcated twice and was widely patent.  The PABG branch  trifurcated and had no significant stenosis.   The right coronary was a dominant vessel.  There was 40% segmental smooth  narrowing in the mid portion beyond the trifurcating RV branch.  The distal  RCA had no significant stenosis of the PDA or bifurcating PLA, and the AV  node branch came off the PLA.   DISCUSSION:  The patient's history is as outlined above.  She has no  restenosis of her distal LAD stent and noncritical coronary disease.  She is  at risk for progression of disease because of her significant risk factors,  and I would recommend continued medical therapy.  I would increase her PPI  therapy in the event that her current chest pain is related to GERD reflux  or esophageal spasm.  I recommend reassurance of the patient at present and  continued medical therapy.  She may need to be considered for an ACE and/or  ARB because of significant systemic hypertension and associated diabetes. Medical followup with Dr.  Dorothyann Peng.   CATHETERIZATION DIAGNOSES:  1.  Chest pain, etiology not determined.  2.  Gastroesophageal reflux disease, possible esophageal spasm.  3.  Prior distal left anterior descending drug-eluting stent, 2003.  No      restenosis on this study.  4.  Noncritical associated coronary disease, as outlined above.  5.  Normal left ventricular function, left ventricular hypertrophy, normal      renal arteries.  6.  Systemic hypertension.  7.  Adult onset diabetes mellitus.  8.  Hyperlipidemia, presumed.  9.  Exogenous obesity.  10. Remote cholecystectomy.  11. Bipolar disorder, on medical therapy.      Richard A. Alanda Amass, M.D.  Electronically Signed     RAW/MEDQ  D:  03/10/2005  T:  03/11/2005  Job:  [id number]   cc:   Candyce Churn. Allyne Gee, M.D.  [address]  [address]  [place]  [place] [address]  Fax: [phone]   Richard A. Alanda Amass, M.D.  [phone] [address]  [place]  [place] [address]  Fax: [phone]   CP Lab

## 2010-12-03 NOTE — Cardiovascular Report (Signed)
NAME:  Kathy Maxwell, LIGAS NO.:  [id number]   MEDICAL RECORD NO.:  [id number]                   PATIENT TYPE:  INP   LOCATION:  3733                                 FACILITY:  [facility]   PHYSICIAN:  Lennette Bihari, M.D.               DATE OF BIRTH:  [birth year]   DATE OF PROCEDURE:  03/05/2002  DATE OF DISCHARGE:  03/06/2002                              CARDIAC CATHETERIZATION   INDICATION:  The patient is a 55 year old female who underwent stenting of  her distal LAD with a 2.5 x 18 Cypher stent in April of 2003.  At that time,  she did have condominant CAD with 40% smooth LAD lesion. The patient  initially did well. In June, a Cardiolite study was done because of vague  chest pain, which showed significant breast attenuation and raised the  possibility of subtle anterior wall ischemia. The patient has been on  medical therapy. She was apparently seen in the office yesterday as an add-  on complaining of chest pain described as sharp.  Because of recurrent  symptoms over the last several weeks, she was admitted with possible  unstable anginal symptomatology and scheduled for definitive  catheterization.   DESCRIPTION OF PROCEDURE:  After premedication with Versed 2 mg intravenous,  the patient was prepped and draped in the usual fashion. Her right femoral  artery was punctured anteriorly and a 6 French sheath was inserted.  Diagnostic catheterization was done with a 6 French Judkins left 3.5  catheter as well as a 6 [place] FR4 catheter.  A pigtail was used for biplane  cine left ventriculography. Hemostasis was obtained by direct manual  pressure. The patient tolerated the procedure well.   HEMODYNAMIC DATA:  Central aortic pressure was 165/87, mean 120.  Left  ventricular pressure 165/14.  However, at the start of the procedure,  central aortic pressure was 195/105 and her IC nitroglycerin was increased.   ANGIOGRAPHIC DATA:  The left main coronary  artery was short and had an  upward takeoff and immediately gave rise to an LAD and left circumflex  system.  The ostium at the LAD had at least 30% smooth narrowing. The LAD  after the first diagonal vessel had 40% smooth narrowing. There did appear  to be component of intramyocardial mid systolic muscle bridging. IC  nitroglycerin was administration without complete resolution. There was no  re-stenosis in the distal LAD at the prior Cypher stent site.   The circumflex vessel was angiographically normal and gave rise to two small  first marginal vessels and ended in a third large bifurcating marginal  vessel.   The right coronary artery was angiographically normal and gave rise to a  inferior LV branch, PDA and posterolateral system.   Biplane cine left ventriculography revealed normal LV function without focal  segmental wall motion abnormalities.   IMPRESSION:  1. Normal left ventricular function.  2.  No re-stenosis in the distal left anterior descending, previously stented     segment but with evidence for a 30% ostial smooth left anterior     descending narrowing, and also 40% mid left anterior descending narrowing     after the first diagonal vessel with a component of mid systolic     bridging.    RECOMMENDATIONS:  Medical therapy including continuation of nitrates and non  optimal blood pressure therapy.                                                 Lennette Bihari, M.D.    TAK/MEDQ  D:  03/05/2002  T:  03/07/2002  Job:  [id number]   cc:   Candyce Churn. Allyne Gee, M.D.   Richard A. Alanda Amass, M.D.   Madaline Savage, M.D.   Cardiac Catheterization Laboratory   [facility]

## 2010-12-03 NOTE — Discharge Summary (Signed)
[place]  Patient:    Kathy Maxwell, Kathy Maxwell Visit Number: [id number] MRN: [id number]          Service Type: MED Location: [phone] Attending Physician:  Gwynneth Aliment Dictated by:   Velna Hatchet, M.D. Admit Date:  11/17/2001 Discharge Date: 11/22/2001                             Discharge Summary  CONSULTANTS INCLUDE: [facility]  ADMISSION DIAGNOSES: 1. Dizziness. 2. Gait dysfunction. 3. Orthostatic hypotension. 4. Type 2 diabetes uncontrolled. 5. Hypertension. 6. Coronary artery disease status post stent on April 28. 7. Hypercholesterolemia. 8. Bipolar disorder.  DISCHARGE DIAGNOSES: 1. Vestibular dysfunction. 2. Type 2 diabetes. 3. Hypertension. 4. Hypercholesterolemia. 5. Bipolar disorder. 6. Reflux. 7. Coronary artery disease. 8. Depression.  DISCHARGE MEDICATIONS: 1. Seroquel 400 mg p.o. q.h.s. 2. Glucovance 5/500 mg two tablets p.o. b.i.d. 3. Aspirin 325 mg daily. 4. Plavix 75 mg daily. 5. Zocor 20 mg at bedtime. 6. Nexium 40 mg daily. 7. Actos 45 mg daily. 8. Lamictal 200 mg two times a day. 9. Wellbutrin 150 mg two times a day.  HISTORY OF PRESENT ILLNESS: The patient is a 55 year old female with a newly diagnosed history of coronary artery disease.  She is status post a catheterization on November 12, 2001 with repeat PTCA and stent placed in the LAD on April 28.  She has a past medical history of type 2 diabetes, hypertension, hyperlipidemia, obesity and remote tobacco usage.  HOSPITAL COURSE: She presented to the ER on May 3 with symptoms of dizziness and nausea.  The patient states that she had gone out for a walk and had become dizzy and her speech became slow, so she came to the emergency room for evaluation to make sure that she did not have a stroke.  She was admitted to telemetry by the Cardiology service.  There were no new EKG changes and a CT of the head was negative for acute  ischemia. However, she was found to have orthostatic hypotension.  This was thought to be due to a recent increase in her dosage of her psychiatric medications including specifically Lamictal which is known to cause dizziness, nausea and hypotension.  This medication was decreased to its previous dose, 200 mg twice daily, and as well her diuretic was discontinued.  The patient had improvement in her symptoms.  She had a PT and OT evaluation which was suggestive of vestibular system involvement. It was recommended that the patient have outpatient therapy and that she was safe to be discharged home with 24 hour supervision.  Because of her cardiac and cerebrovascular risk factors including type 2 diabetes, coronary artery disease, hypertension and hypercholesterolemia an MRI was performed to confirm that the patient did not have any new infarcts.  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  These findings were discussed with Neurology and they did not feel that a formal consult was necessary.  They agreed with physical therapys recommendations for outpatient physical therapy to address her vestibular dysfunction.  Her hospitalization was also complicated by uncontrolled blood sugars.  It was stressed to the patient that insulin would be needed to help control her blood sugars, however, she refused insulin at this time.  She agreed to vigorously watch her dietary intake to help control her blood sugars.  She is currently maxed out on Glucovance  5/500 mg two tablets twice daily and Actos 45 mg daily. She is unable to endure an exercise program at this time because of her vestibular dysfunction.  However, she will be following up at the [facility] for diabetic management. Again with regards to her vestibular dysfunction if her symptoms persisted, an ENT evaluation will be warranted.  DISPOSITION: She was discharged home on Nov 22, 2001 in stable and improved condition.  FOLLOW-UP: 1. She will follow-up with Dr. Allyne Gee on Dec 07, 2001 at 9:15 a.m.  2. She will follow-up with [facility] as previously    scheduled from her previous hospitalization. 3. She will also follow-up with her psychiatrist to let her know that she    went back to her previous dosages on her psychiatric medications. Dictated by:   Velna Hatchet, M.D. Attending Physician:  Gwynneth Aliment DD:  11/22/01 TD:  11/25/01 Job: [id number]

## 2010-12-03 NOTE — Discharge Summary (Signed)
NAME:  Kathy Maxwell, Kathy Maxwell NO.:  [id number]   MEDICAL RECORD NO.:  [id number]          PATIENT TYPE:  INP   LOCATION:  3733                         FACILITY:  [facility]   PHYSICIAN:  Richard A. Alanda Amass, M.D.DATE OF BIRTH:  1956/06/09   DATE OF ADMISSION:  03/09/2005  DATE OF DISCHARGE:  03/11/2005                                 DISCHARGE SUMMARY   DISCHARGE DIAGNOSES:  1.  Chest pain, etiology undetermined.  2.  Known coronary artery disease status post percutaneous coronary      intervention in April 2003, with a repeat catheterization in August      2003, revealed patent stent and 40% mid left anterior descending artery      stenosis with intravascular bridging, normal left ventricular function.  3.  Hypertension.  4.  Dyslipidemia.  5.  Non-insulin-dependent diabetes mellitus.  6.  Bipolar disorder.   HISTORY OF PRESENT ILLNESS/HOSPITAL COURSE:  This is a 55 year old female  patient of Dr. Alanda Amass who presented to the emergency room at [facility] with complaints of intermittent chest pain, began approximately 12  hours prior to presentation and described the pain as a mid substernal ache.  She was admitted to the telemetry unit under rule out MI protocol.  Her  enzymes did not reveal any abnormality.  The patient was seen the morning of  admission by Dr. Alanda Amass and taken to the cath lab to rule out progression  of coronary disease.  Cath revealed normal left ventricular function with EF  greater than 55%, left ventricular hypertrophy, no restenosis of the  previously stented segments, no significant progression of heart disease was  detected and recommendations were continued medical therapy.   The next morning, the patient was assessed by Dr. Alanda Amass, seemed to be  stable for discharge home.  Her right groin was okay.  She was started on  ACE inhibitor for better blood pressure control and renovascular  preservation.   Her laboratories at the  day of discharge revealed a sodium of 134, potassium  4.1, chloride 105, CO2 23, glucose 189, BUN 12, creatinine 1.0, calcium 8.7.  Her CBC revealed a white blood cell count 4, hemoglobin 11, hematocrit 32,  and platelet count 365.   DISCHARGE MEDICATIONS:  1.  Nexium 40 mg every day.  2.  Lexapro 30 mg every day.  3.  Vytorin 10/40 mg every day.  4.  Lamictal 200 mg b.i.d.  5.  Actos 45 mg every day.  6.  Seroquel 400 mg q.h.s.  7.  Toprol XL 25 mg every day.  8.  Imdur 30 mg every day.  9.  Folic acid 1 mg every day.  10. Aspirin 81 mg every day.  11. Wellbutrin XL 450 mg every day.  12. Altace 5 mg every day.   DISCHARGE FOLLOWUP:  1.  Nurse practitioner, __________, will see patient on March 23, 2005 at      2 p.m.  2.  The patient will be notified about the followup appointment with Dr.      Alanda Amass in October.   DISCHARGE DIET:  Low salt, low fat, low cholesterol diet.   DISCHARGE ACTIVITY:  No driving for three days, no lifting greater than 5  pounds for three days, no strenuous physical activity for three days.   DISCHARGE INSTRUCTIONS:  She was instructed to report any problems with  groin site, our office phone number was provided.      Raymon Mutton, P.A.      Richard A. Alanda Amass, M.D.  Electronically Signed    MK/MEDQ  D:  03/11/2005  T:  03/12/2005  Job:  [id number]   cc:   [facility]

## 2010-12-03 NOTE — Op Note (Signed)
NAME:  Kathy Maxwell, Kathy Maxwell                          ACCOUNT NO.:  [id number]   MEDICAL RECORD NO.:  [id number]                   PATIENT TYPE:  AMB   LOCATION:  DSC                                  FACILITY:  [facility]   PHYSICIAN:  Harvie Junior, M.D.                DATE OF BIRTH:  12-May-1956   DATE OF PROCEDURE:  12/31/2003  DATE OF DISCHARGE:                                 OPERATIVE REPORT   PREOPERATIVE DIAGNOSIS:  First extensor compartment tendinitis, left wrist.   POSTOPERATIVE DIAGNOSIS:  First extensor compartment tendinitis, left wrist.   PRINCIPAL PROCEDURE:  Release of left first extensor compartment.   SURGEON:  Harvie Junior, M.D.   ASSISTANT:  Marshia Ly, P.A.   ANESTHESIA:  Forearm-based IV regional.   BRIEF HISTORY:  A 55 year old female with a long history of having had  significant pain in the first extensor compartment.  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  There had been some slight skin  discoloration and some other issues and because of continued complaints of  pain and failure of conservative care, she ultimately was evaluated and  because of continued complaints, she was taken to the operating room for  release of her first extensor compartment.   PROCEDURE:  The patient was brought to the operating room and after adequate  anesthesia was obtained with a forearm-based IV regional, the patient was  placed supine on the operating table and the left wrist was prepped and  draped in the usual sterile fashion.  Following this a small incision was  made horizontally over the first extensor compartment.  The subcutaneous  tissue was taken down to the level of the first extensor compartment.  The  first extensor compartment was identified and opened.  The abductor pollicis  longus and extensor pollicis brevis tendons were identified.  There were  multiple slips of each, and all of the slips of each of these tendons were  opened and once this  was accomplished, this was freed up quite dramatically.  There was some synovial proliferation which was identified in this area,  which was debrided.  At this point the wound was copiously irrigated and  suctioned dry.  The skin was closed with a 3-0 Monocryl pull-out suture.  Benzoin and Steri-Strips were applied as well as a sterile compressive  dressing and a splint.  The patient was taken to the recovery room, where  she was noted to be in satisfactory condition.  Estimated blood loss for the  procedure was none.                                               Harvie Junior, M.D.    Ranae Plumber  D:  12/31/2003  T:  12/31/2003  Job:  [id number]

## 2010-12-03 NOTE — H&P (Signed)
[facility]  Patient:    Kathy Maxwell, Kathy Maxwell Visit Number: [id number] MRN: [id number]          Service Type: MED Location: [id number] Attending Physician:  Alva Garnet. Dictated by:   Merlene Laughter Renae Gloss, M.D. Admit Date:  11/11/2001                           History and Physical  CHIEF COMPLAINT:  Chest pain.  HISTORY OF PRESENT ILLNESS:  Kathy Maxwell is a 55 year old lady who presents with a three-day history of midsternal chest pain associated with nausea.  She denies any particular association with food or activity.  She denies shortness of breath, palpitations, or diaphoresis.  She states that she has had previous admissions in [place] for recurrent chest pain and cardiology evaluation, including a stress test, has been unremarkable.  She has never had a cardiac catheterization.  She describes the chest pain as midsternal and persistent, like a dull ache.  The chest pain was relieved with nitroglycerin x 2 in the emergency room.  She has no other acute constitutional or systemic complaints at this time.  ALLERGIES:  NKDA.  MEDICATIONS:  1. GlucoVance 5/500 mg two p.o. b.i.d.  2. Actos 30 mg p.o. b.i.d.  3. Accupril 20 mg p.o. q.d.  4. Nexium 40 mg p.o. q.d.  5. Maxzide 37.5/25 mg p.o. q.d.  6. Wellbutrin 200 mg one p.o. b.i.d.  7. Seroquel 400 mg four tablets p.o. q.h.s.  8. Estratest HS q.d.  9. Zocor 20 mg p.o. q.d. 10. Lamictal 200 mg p.o. b.i.d.  PAST MEDICAL HISTORY: 1. Hypertension. 2. Type 2 diabetes mellitus. 3. Hyperlipidemia. 4. GERD. 5. Anxiety/depression.  PAST SURGICAL HISTORY:  Status post cholecystectomy.  FAMILY HISTORY:  Significant for heart disease and diabetes in her mother who was deceased at age 59 and diabetes, significant heart disease in a sister who was deceased at age 27 from a massive heart attack, and breast cancer in an aunt.  SOCIAL HISTORY:  Kathy Maxwell denies tobacco, alcohol, or drugs  of abuse.  REVIEW OF SYSTEMS:  As per HPI and health history assessment.  PHYSICAL EXAMINATION:  GENERAL APPEARANCE:  A well-developed, well-nourished, black female in no acute distress, but still complaining of chest pain.  VITAL SIGNS:  Temperature 97.1 degrees, heart rate 81, respirations 16, blood pressure 143/74.  HEENT:  TMs within normal limits bilaterally.  No oropharynx lesions.  PERRLA.  EOMI.  NECK:  Supple.  No masses.  Carotids 2+.  No bruits.  LUNGS:  Clear to auscultation bilaterally.  HEART:  S1 and S2.  Regular rate and rhythm.  No murmurs, rubs, or gallops.  ABDOMEN:  Soft, nontender, and nondistended.  Positive bowel sounds.  EXTREMITIES:  No clubbing, cyanosis, or edema.  NEUROLOGIC:  Alert and responsive.  ASSESSMENT AND PLAN: 1. Chest pain.  The etiology is unclear, however, Kathy Maxwell has very    significant cardiac risk factors, including hypertension, poorly controlled    diabetes, hyperlipidemia, and family history.  The initial troponin and    CK-MB enzymes are unremarkable, as well as unremarkable telemetry and    electrocardiogram.  However, her pretest probability for significant    coronary artery disease is high and she probably wound benefit from a    catheterization procedure.  Since she is continuing to have chest pain and    the previous episode was improved with nitroglycerin, I will go ahead  and    start heparin and nitroglycerin at this time.  She will require a    cardiology consultation for further cardiology evaluation.  If the    cardiology evaluation is unremarkable, other etiologies to consider would    be gastrointestinal, musculoskeletal, or anxiety related. 2. Hypertension.  Currently well controlled. 3. Type 2 diabetes mellitus currently poorly controlled.  Adjustments to her    diabetic regimen will be made during this admission. Dictated by:   Merlene Laughter Renae Gloss, M.D. Attending Physician:  Andi Devon R. DD:   11/11/01 TD:  11/12/01 Job: [id number]

## 2010-12-03 NOTE — Discharge Summary (Signed)
NAME:  Kathy Maxwell, Kathy Maxwell NO.:  [id number]   MEDICAL RECORD NO.:  [id number]                   PATIENT TYPE:  OBV   LOCATION:  3727                                 FACILITY:  [facility]   PHYSICIAN:  Richard A. Alanda Amass, M.D.          DATE OF BIRTH:  29-Oct-1955   DATE OF ADMISSION:  05/30/2002  DATE OF DISCHARGE:  05/31/2002                                 DISCHARGE SUMMARY   DISCHARGE DIAGNOSES:  1. Chest pain, noncardiac in origin with myocardial infarction ruled out     this admission.  2. Known coronary disease with left anterior descending stenting with a     Cypher stent April 2003, no restenosis at catheterization in August 2003.  3. Non-insulin dependent diabetes.  4. Hypertension.  5. History of bipolar disorder.   HOSPITAL COURSE:  The patient is a 55 year old female with a history of  coronary disease.  She had an LAD Cypher stent placed in April 2003.  She  had an abnormal Cardiolite in June 2003 but was not having symptoms, she had  breast attenuation versus ischemia.  She had recurrent chest pain in August  2003 requiring restudy showing a normal RCA, normal circumflex, and 40% LAD  with some myocardial bridging.  There was no in-stent restenosis.  She was  admitted to the emergency room May 30, 2002 with recurrent chest pain  in the setting of emotional stress.  She had had a conflict with her  roommate.  She was admitted for 24-hour observation.  CK-MB's and troponins  were negative; her CK was elevated but her MB and troponins were negative.  Her chest pain is improved.  We feel she can be discharged May 31, 2002.   DISCHARGE ACTIVITIES:  1. Chest pain, noncardiac.  2. Known coronary disease with previous left anterior descending Cypher     stenting April 2003.  3. Non-insulin dependent diabetes.  4. Treated hypertension.  5. Treated hyperlipidemia.  6. History of bipolar disorder.   DISCHARGE MEDICATIONS:  1. Toprol XL  37.5 mg a day.  2. Lamictal 200 mg twice a day.  3. Imdur 30 mg a day.  4. Zocor 40 mg a day.  5. Nexium 40 mg a day.  6. Actos 45 mg a day.  7. Wellbutrin 150 b.i.d.  8. Glucophage XR 750 q.d.  9. Aspirin 81 mg a day.  10.      Vioxx 25 mg a day.   LABORATORY DATA:  White count 4.3, hemoglobin 12.1, hematocrit 35.7,  platelets 296.  INR 0.9.  CK peaked at 627, MB 5.1, with a relative index of  0.8.  Troponins were negative x3.  BUN 12, creatinine 0.8.  Sodium 140.  Potassium was 3.0, her potassium was replaced and rechecked prior to  discharge.   Chest x-ray showed no active disease.  EKG shows sinus rhythm without acute  changes.   DISPOSITION:  The  patient is discharged in stable condition pending a  followup potassium level May 31, 2002.  She will follow up with Dr.  Alanda Amass.     Abelino Derrick, P.A.                      Richard A. Alanda Amass, M.D.    Lenard Lance  D:  05/31/2002  T:  05/31/2002  Job:  [id number]   cc:   Candyce Churn. Allyne Gee, M.D.

## 2011-08-29 ENCOUNTER — Other Ambulatory Visit: Payer: Self-pay | Admitting: Internal Medicine

## 2011-08-29 DIAGNOSIS — Z1231 Encounter for screening mammogram for malignant neoplasm of breast: Secondary | ICD-10-CM

## 2011-09-30 ENCOUNTER — Ambulatory Visit
Admission: RE | Admit: 2011-09-30 | Discharge: 2011-09-30 | Disposition: A | Discharge status: Home / Self Care | Payer: Medicare Other | Source: Ambulatory Visit | Attending: Internal Medicine | Admitting: Internal Medicine

## 2011-09-30 DIAGNOSIS — Z1231 Encounter for screening mammogram for malignant neoplasm of breast: Secondary | ICD-10-CM

## 2012-01-24 DIAGNOSIS — H18459 Nodular corneal degeneration, unspecified eye: Secondary | ICD-10-CM | POA: Insufficient documentation

## 2012-07-18 HISTORY — PX: MASTECTOMY: SHX3

## 2012-07-18 HISTORY — PX: BREAST LUMPECTOMY: SHX2

## 2012-09-14 ENCOUNTER — Other Ambulatory Visit: Payer: Self-pay

## 2012-09-14 ENCOUNTER — Other Ambulatory Visit: Payer: Self-pay | Admitting: Internal Medicine

## 2012-09-14 DIAGNOSIS — Z1231 Encounter for screening mammogram for malignant neoplasm of breast: Secondary | ICD-10-CM

## 2012-10-01 ENCOUNTER — Ambulatory Visit: Payer: Medicare Other

## 2012-10-26 ENCOUNTER — Ambulatory Visit: Payer: Medicare Other

## 2012-11-26 ENCOUNTER — Ambulatory Visit: Payer: Medicare Other

## 2013-01-14 ENCOUNTER — Ambulatory Visit
Admission: RE | Admit: 2013-01-14 | Discharge: 2013-01-14 | Disposition: A | Discharge status: Home / Self Care | Payer: Medicare Other | Source: Ambulatory Visit

## 2013-01-14 DIAGNOSIS — Z1231 Encounter for screening mammogram for malignant neoplasm of breast: Secondary | ICD-10-CM

## 2013-01-16 ENCOUNTER — Other Ambulatory Visit: Payer: Self-pay | Admitting: Internal Medicine

## 2013-01-16 DIAGNOSIS — R928 Other abnormal and inconclusive findings on diagnostic imaging of breast: Secondary | ICD-10-CM

## 2013-03-01 ENCOUNTER — Ambulatory Visit
Admission: RE | Admit: 2013-03-01 | Discharge: 2013-03-01 | Disposition: A | Discharge status: Home / Self Care | Payer: Medicare Other | Source: Ambulatory Visit | Attending: Internal Medicine | Admitting: Internal Medicine

## 2013-03-01 ENCOUNTER — Other Ambulatory Visit: Payer: Self-pay | Admitting: Internal Medicine

## 2013-03-01 DIAGNOSIS — R928 Other abnormal and inconclusive findings on diagnostic imaging of breast: Secondary | ICD-10-CM

## 2013-03-01 DIAGNOSIS — N6320 Unspecified lump in the left breast, unspecified quadrant: Secondary | ICD-10-CM

## 2013-03-07 ENCOUNTER — Other Ambulatory Visit: Payer: Self-pay | Admitting: Internal Medicine

## 2013-03-07 ENCOUNTER — Ambulatory Visit
Admission: RE | Admit: 2013-03-07 | Discharge: 2013-03-07 | Disposition: A | Discharge status: Home / Self Care | Payer: Medicare Other | Source: Ambulatory Visit | Attending: Internal Medicine | Admitting: Internal Medicine

## 2013-03-07 DIAGNOSIS — N6320 Unspecified lump in the left breast, unspecified quadrant: Secondary | ICD-10-CM

## 2013-03-07 HISTORY — PX: BREAST BIOPSY: SHX20

## 2013-03-08 ENCOUNTER — Other Ambulatory Visit: Payer: Self-pay | Admitting: Internal Medicine

## 2013-03-08 DIAGNOSIS — C50919 Malignant neoplasm of unspecified site of unspecified female breast: Secondary | ICD-10-CM

## 2013-03-10 DIAGNOSIS — C50919 Malignant neoplasm of unspecified site of unspecified female breast: Secondary | ICD-10-CM | POA: Insufficient documentation

## 2013-03-10 HISTORY — DX: Malignant neoplasm of unspecified site of unspecified female breast: C50.919

## 2013-03-15 ENCOUNTER — Ambulatory Visit
Admission: RE | Admit: 2013-03-15 | Discharge: 2013-03-15 | Disposition: A | Discharge status: Home / Self Care | Payer: Medicare Other | Source: Ambulatory Visit | Attending: Internal Medicine | Admitting: Internal Medicine

## 2013-03-15 ENCOUNTER — Encounter (INDEPENDENT_AMBULATORY_CARE_PROVIDER_SITE_OTHER): Payer: Self-pay | Admitting: General Surgery

## 2013-03-15 ENCOUNTER — Other Ambulatory Visit (INDEPENDENT_AMBULATORY_CARE_PROVIDER_SITE_OTHER): Payer: Self-pay | Admitting: General Surgery

## 2013-03-15 ENCOUNTER — Ambulatory Visit (INDEPENDENT_AMBULATORY_CARE_PROVIDER_SITE_OTHER): Payer: Medicare Other | Admitting: General Surgery

## 2013-03-15 VITALS — BP 132/56 | HR 78 | Temp 97.9°F | Resp 14 | Ht 62.0 in | Wt 207.4 lb

## 2013-03-15 DIAGNOSIS — Z170 Estrogen receptor positive status [ER+]: Secondary | ICD-10-CM | POA: Insufficient documentation

## 2013-03-15 DIAGNOSIS — C50919 Malignant neoplasm of unspecified site of unspecified female breast: Secondary | ICD-10-CM

## 2013-03-15 DIAGNOSIS — C50419 Malignant neoplasm of upper-outer quadrant of unspecified female breast: Secondary | ICD-10-CM

## 2013-03-15 DIAGNOSIS — C50412 Malignant neoplasm of upper-outer quadrant of left female breast: Secondary | ICD-10-CM

## 2013-03-15 DIAGNOSIS — C50912 Malignant neoplasm of unspecified site of left female breast: Secondary | ICD-10-CM

## 2013-03-15 MED ORDER — GADOBENATE DIMEGLUMINE 529 MG/ML IV SOLN
14.0000 mL | Freq: Once | INTRAVENOUS | Status: AC | PRN
  Administered 2013-03-15: 14 mL via INTRAVENOUS

## 2013-03-15 NOTE — Progress Notes (Addendum)
Patient ID: Kathy Maxwell, female   DOB: 06/12/1956, 57 y.o.   MRN: [id number]  Chief Complaint  Patient presents with  . New Evaluation    eval lf br invasive ca    HPI Kathy Maxwell is a 57 y.o. female.  She is referred by Dr. Micheline Maze at the breast center [facility] for evaluation of a newly diagnosed invasive cancer of the left breast with positive lymph node metastasis. Dr. Dorothyann Peng is her primary care physician.   This patient has never had any breast problems before and did not perceive a mass. She went for screening mammograms which led to diagnostic mammograms and ultrasound of the left side. The mammograms & ultrasound showed 2.9 x 1.6 x 1.9 cm suspicious mass in the left breast, 12:30 position, 6 cm from the nipple. There was also a suspicious left axillary lymph nodes. Her breasts are quite dense, which I suspect will make mammographic followup problematic.   Image guided biopsy of the left breast mass and the lymph node showed invasive mammary carcinoma, probably ductal phenotype. ER 100%, PR 98%, Ki 67 13%, HER-2 negative.  MRI was performed today, but has not been read yet. I will get a reading later today. The patient is aware of her diagnosis.  Family history is positive for breast cancer in a maternal aunt who had a mastectomy at age 66. No other breast or ovarian cancer.  The patient has significant morbidities with bipolar disorder, hypertension, obesity. She's had TAH and BSO. She's had 2 pregnancies, 2 abortions. She is scheduled for cataract surgery in [place] next week. She states she is on Medicare because of her bipolar disorder. HPI  Past Medical History  Diagnosis Date  . Hyperlipidemia   . Hypertension   . Diabetes mellitus without complication   . Bipolar 1 disorder     Past Surgical History  Procedure Laterality Date  . Abdominal hysterectomy    . Cholecystectomy      Family History  Problem Relation Age of Onset  . Heart disease Mother      Social History History  Substance Use Topics  . Smoking status: Former Smoker    Quit date: 07/19/1987  . Smokeless tobacco: Never Used  . Alcohol Use: No    Not on File  Current Outpatient Prescriptions  Medication Sig Dispense Refill  . amLODipine-olmesartan (AZOR) 10-40 MG per tablet Take 1 tablet by mouth daily.      [name] buPROPion (WELLBUTRIN SR) 100 MG 12 hr tablet Take 100 mg by mouth 2 (two) times daily.      [name] esomeprazole (NEXIUM) 40 MG capsule Take 40 mg by mouth daily before breakfast.      . metoprolol tartrate (LOPRESSOR) 25 MG tablet Take 25 mg by mouth 2 (two) times daily.      . pioglitazone-metformin (ACTOPLUS MET) 15-850 MG per tablet Take 1 tablet by mouth 2 (two) times daily with a meal.      . rosuvastatin (CRESTOR) 10 MG tablet Take 10 mg by mouth daily.      . sitaGLIPtin (JANUVIA) 100 MG tablet Take 100 mg by mouth daily.       No current facility-administered medications for this visit.    Review of Systems Review of Systems  Constitutional: Negative for fever, chills and unexpected weight change.  HENT: Negative for hearing loss, congestion, sore throat, trouble swallowing and voice change.   Eyes: Negative for visual disturbance.  Respiratory: Negative for cough and wheezing.  Cardiovascular: Negative for chest pain, palpitations and leg swelling.  Gastrointestinal: Negative for nausea, vomiting, abdominal pain, diarrhea, constipation, blood in stool, abdominal distention and anal bleeding.  Genitourinary: Negative for hematuria, vaginal bleeding and difficulty urinating.  Musculoskeletal: Negative for arthralgias.  Skin: Negative for rash and wound.  Neurological: Negative for seizures, syncope and headaches.  Hematological: Negative for adenopathy. Does not bruise/bleed easily.  Psychiatric/Behavioral: Negative for confusion. The patient is nervous/anxious.     Blood pressure 132/56, pulse 78, temperature 97.9 F (36.6 C), temperature source  Temporal, resp. rate 14, height 5\' 2"  (1.575 m), weight 207 lb 6.4 oz (94.076 kg).  Physical Exam Physical Exam  Constitutional: She is oriented to person, place, and time. She appears well-developed and well-nourished. No distress.  HENT:  Head: Normocephalic and atraumatic.  Nose: Nose normal.  Mouth/Throat: No oropharyngeal exudate.  Eyes: Conjunctivae and EOM are normal. Pupils are equal, round, and reactive to light. Left eye exhibits no discharge. No scleral icterus.  Neck: Neck supple. No JVD present. No tracheal deviation present. No thyromegaly present.  Cardiovascular: Normal rate, regular rhythm, normal heart sounds and intact distal pulses.   No murmur heard. Pulmonary/Chest: Effort normal and breath sounds normal. No respiratory distress. She has no wheezes. She has no rales. She exhibits no tenderness.    Breasts are moderately large. Bra size 42D according to patient. I suspect I feel a density in the left breast at about the 1:00 position although it is not dramatic. Skin is healthy. No other masses. Not sure I can feel any axillary adenopathy.  Abdominal: Soft. Bowel sounds are normal. She exhibits no distension and no mass. There is no tenderness. There is no rebound and no guarding.  Lower midline scar.  Musculoskeletal: She exhibits no edema and no tenderness.  Lymphadenopathy:    She has no cervical adenopathy.  Neurological: She is alert and oriented to person, place, and time. She exhibits normal muscle tone. Coordination normal.  Skin: Skin is warm. No rash noted. She is not diaphoretic. No erythema. No pallor.  Psychiatric: She has a normal mood and affect. Her behavior is normal. Judgment and thought content normal.  Somewhat anxious.    Data Reviewed Mammograms, ultrasound, and histology, breast diagnostic profile. 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 Clinical stage T2, N1. ER 100%, PR 98%, Ki 67 13%, HER-2-negative.   Bipolar disorder  Hypertension  Obesity  Non-insulin-dependent diabetes mellitus  History TAH and BSO  History of laparoscopic cholecystectomy  Cataracts    Plan    Very long discussion with the patient. I went over the extent of her disease in the breast and the axilla. I discussed options of mastectomy, lumpectomy. I told her she would need an axillary lymph node dissection. I think that we could do a lumpectomy at this time given the size of her breast but there would be some volume loss in the upper pole.  I told it was likely that she would be offered chemotherapy. Told her that she would receive radiation therapy if  she had a lumpectomy and she might receive radiation therapy even if she had a mastectomy. I told her about referral to plastic surgery and reconstructive options, although that might need to be delayed because of the risk of radiation therapy.  I told her that if the MRI showed multifocal disease that  further biopsies might be necessary and if she had multifocal disease mastectomy would be the only option.  She doesn't have strong feelings about whether she has a lumpectomy or mastectomy. She states her aunt has accepted her mastectomy well. We chose not to make a final decision today, especially since the MRI report is pending. She seems to be in processing the information fairly well, considering her bipolar disorder.  She agrees to undergo preoperative referral to medical oncology to decide whether she needs  chemotherapy and a Port-A-Cath.  We will discuss her case at breast conference next Wednesday.  Return to see me in 2 weeks for final decision making about the extent of surgery and whether she receives a Port-A-Cath or not.       [name]. Derrell Lolling, M.D., [facility] General and Minimally invasive Surgery Breast and  Colorectal Surgery Office:   [phone] Pager:   [phone]  03/15/2013, 12:40 PM

## 2013-03-15 NOTE — Patient Instructions (Signed)
You have been diagnosed with invasive cancer of the left breast in the upper outer quadrant, and you have also been diagnosed with cancer in the lymph nodes under your left arm.  We have discussed several options for treatment, including mastectomy or lumpectomy. We have also discussed the axillary lymph node dissection. We have discussed radiation therapy and the possibility of chemotherapy.  Your MRI has not been read yet. We cannot make a final decision until we reviewed that xray.  It is quite possible that you will be offered chemotherapy and we may need to put a Port-A-Cath in.  You will be referred to a medical oncologist immediately for their opinion about whether you will need chemotherapy later.  Return to see Dr. Derrell Lolling in 2 weeks, and we will make final decisions about whether we do a mastectomy or lumpectomy.

## 2013-03-19 ENCOUNTER — Other Ambulatory Visit: Payer: Self-pay | Admitting: Internal Medicine

## 2013-03-19 DIAGNOSIS — R928 Other abnormal and inconclusive findings on diagnostic imaging of breast: Secondary | ICD-10-CM

## 2013-03-20 ENCOUNTER — Telehealth (INDEPENDENT_AMBULATORY_CARE_PROVIDER_SITE_OTHER): Payer: Self-pay

## 2013-03-20 DIAGNOSIS — Z9849 Cataract extraction status, unspecified eye: Secondary | ICD-10-CM | POA: Insufficient documentation

## 2013-03-20 NOTE — Telephone Encounter (Signed)
Message copied by Ivory Broad on Wed Mar 20, 2013  3:13 PM ------      Message from: Louie Casa      Created: Wed Mar 20, 2013  9:07 AM      Regarding: Dr. Derrell Lolling Question about appt 03/28/13      Contact: [phone]       Patient will have to do a mammogram, ultra sound and other tests in the breast center and wants to know if  keep the appointment on 03/28/2013, or even change other day, please call her.      Thank you. ------

## 2013-03-20 NOTE — Telephone Encounter (Signed)
I called and discussed this with the pt.  We decided to push out her appointment with Dr Derrell Lolling so he would have all the test results back.  I scheduled her for 04/08/2013.  She waits to hear from Oncology about her appointment.

## 2013-03-21 ENCOUNTER — Telehealth: Payer: Self-pay | Admitting: *Deleted

## 2013-03-21 NOTE — Telephone Encounter (Signed)
Called to leave pt a message since she is out of town and I could not leave a message on the phone.  I will try back.

## 2013-03-22 ENCOUNTER — Telehealth: Payer: Self-pay | Admitting: *Deleted

## 2013-03-22 NOTE — Telephone Encounter (Signed)
Confirmed 04/04/13 appt w/ pt.  Mailed before appt letter & packet to pt.  Emailed Huntley Dec at [facility] to make her aware.  Took paperwork to Med Rec for chart.

## 2013-03-26 ENCOUNTER — Other Ambulatory Visit: Payer: Medicare Other

## 2013-03-28 ENCOUNTER — Other Ambulatory Visit: Payer: Medicare Other

## 2013-03-28 ENCOUNTER — Other Ambulatory Visit: Payer: Self-pay | Admitting: Internal Medicine

## 2013-03-28 ENCOUNTER — Ambulatory Visit
Admission: RE | Admit: 2013-03-28 | Discharge: 2013-03-28 | Disposition: A | Discharge status: Home / Self Care | Payer: Medicare Other | Source: Ambulatory Visit | Attending: Internal Medicine | Admitting: Internal Medicine

## 2013-03-28 ENCOUNTER — Encounter (INDEPENDENT_AMBULATORY_CARE_PROVIDER_SITE_OTHER): Payer: Medicare Other | Admitting: General Surgery

## 2013-03-28 DIAGNOSIS — R928 Other abnormal and inconclusive findings on diagnostic imaging of breast: Secondary | ICD-10-CM

## 2013-04-02 ENCOUNTER — Other Ambulatory Visit: Payer: Self-pay | Admitting: *Deleted

## 2013-04-02 DIAGNOSIS — C50412 Malignant neoplasm of upper-outer quadrant of left female breast: Secondary | ICD-10-CM

## 2013-04-03 ENCOUNTER — Other Ambulatory Visit (HOSPITAL_COMMUNITY): Payer: Self-pay | Admitting: Diagnostic Radiology

## 2013-04-03 ENCOUNTER — Ambulatory Visit
Admission: RE | Admit: 2013-04-03 | Discharge: 2013-04-03 | Disposition: A | Discharge status: Home / Self Care | Payer: Medicare Other | Source: Ambulatory Visit | Attending: Internal Medicine | Admitting: Internal Medicine

## 2013-04-03 DIAGNOSIS — R928 Other abnormal and inconclusive findings on diagnostic imaging of breast: Secondary | ICD-10-CM

## 2013-04-03 MED ORDER — GADOBENATE DIMEGLUMINE 529 MG/ML IV SOLN: 19.0000 mL | Freq: Once | INTRAVENOUS | Status: DC | PRN

## 2013-04-04 ENCOUNTER — Other Ambulatory Visit (HOSPITAL_BASED_OUTPATIENT_CLINIC_OR_DEPARTMENT_OTHER): Payer: Medicare Other | Admitting: Lab

## 2013-04-04 ENCOUNTER — Encounter: Payer: Self-pay | Admitting: Oncology

## 2013-04-04 ENCOUNTER — Ambulatory Visit: Payer: Medicare Other

## 2013-04-04 ENCOUNTER — Ambulatory Visit (HOSPITAL_BASED_OUTPATIENT_CLINIC_OR_DEPARTMENT_OTHER): Payer: Medicare Other | Admitting: Oncology

## 2013-04-04 VITALS — BP 131/65 | HR 82 | Temp 98.3°F | Resp 20 | Wt 205.9 lb

## 2013-04-04 DIAGNOSIS — Z170 Estrogen receptor positive status [ER+]: Secondary | ICD-10-CM

## 2013-04-04 DIAGNOSIS — C778 Secondary and unspecified malignant neoplasm of lymph nodes of multiple regions: Secondary | ICD-10-CM

## 2013-04-04 DIAGNOSIS — E785 Hyperlipidemia, unspecified: Secondary | ICD-10-CM

## 2013-04-04 DIAGNOSIS — C50419 Malignant neoplasm of upper-outer quadrant of unspecified female breast: Secondary | ICD-10-CM

## 2013-04-04 DIAGNOSIS — I10 Essential (primary) hypertension: Secondary | ICD-10-CM

## 2013-04-04 DIAGNOSIS — C50412 Malignant neoplasm of upper-outer quadrant of left female breast: Secondary | ICD-10-CM

## 2013-04-04 DIAGNOSIS — F319 Bipolar disorder, unspecified: Secondary | ICD-10-CM

## 2013-04-04 HISTORY — DX: Bipolar disorder, unspecified: F31.9

## 2013-04-04 HISTORY — DX: Hyperlipidemia, unspecified: E78.5

## 2013-04-04 HISTORY — DX: Essential (primary) hypertension: I10

## 2013-04-04 LAB — COMPREHENSIVE METABOLIC PANEL (CC13)
ALT: 11 U/L (ref 0–55)
BUN: 24.8 mg/dL (ref 7.0–26.0)
CO2: 29 mEq/L (ref 22–29)
Calcium: 9.7 mg/dL (ref 8.4–10.4)
Chloride: 104 mEq/L (ref 98–109)
Creatinine: 1.3 mg/dL — ABNORMAL HIGH (ref 0.6–1.1)

## 2013-04-04 LAB — CBC WITH DIFFERENTIAL/PLATELET
Basophils Absolute: 0 10*3/uL (ref 0.0–0.1)
HCT: 34 % — ABNORMAL LOW (ref 34.8–46.6)
HGB: 11.3 g/dL — ABNORMAL LOW (ref 11.6–15.9)
MONO#: 0.5 10*3/uL (ref 0.1–0.9)
NEUT#: 3.4 10*3/uL (ref 1.5–6.5)
NEUT%: 57.1 % (ref 38.4–76.8)
RDW: 13.9 % (ref 11.2–14.5)
WBC: 5.9 10*3/uL (ref 3.9–10.3)
lymph#: 1.9 10*3/uL (ref 0.9–3.3)

## 2013-04-04 NOTE — Progress Notes (Signed)
Checked in new pt with no financial concerns. °

## 2013-04-04 NOTE — Progress Notes (Signed)
RENNAE FERRAIOLO [id number] [birth year] 57 y.o. 04/04/2013 4:17 PM  CC  Gwynneth Aliment, MD [address] [place] [address] [place] [address] Dr. 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 Patient is seen in medical oncology for discussion of treatment options  STAGE:   No matching staging information was found for the patient.  REFERRING PHYSICIAN: Dr. Claud Kelp  HISTORY OF PRESENT ILLNESS:  DANIELA SIEBERS is a 57 y.o. female.  With other medical problems who underwent a screening mammogram that led to a diagnostic mammogram and ultrasound of the left breast. The mammogram and ultrasound showed a 2.9 x 1.6 x 1.9 cm suspicious mass in the left breast at the 12:30 oh clock position 6 cm from the nipple. There was also a suspicious left axillary lymph node. She had image guided biopsy of the left breast mass and the lymph node. The pathology revealed an invasive mammary carcinoma probably ductal phenotype. Tumor was estrogen receptor +100% progesterone receptor +98% proliferation marker Ki-67 13% and HER-2/neu negative. Patient had MRI of the breasts performed on 03/15/2013 the left breast revealed a 7.4 cm area of what the lymph node. Right breast revealed and a 3.3 cm mass. Patient was seen by Dr. Claud Kelp for discussion of surgical options. He has referred the patient to medical oncology for discussion whether or not she will need a Port-A-Cath and chemotherapy. She is without any complaints. She is accompanied by her clot daughter.   Past Medical History: Past Medical History  Diagnosis Date  . Hyperlipidemia   . Hypertension   . Diabetes mellitus without complication   . Bipolar 1 disorder   . Cancer   . Hypertension 04/04/2013  . Hyperlipidemia 04/04/2013  . Bipolar 1 disorder 04/04/2013   Past Surgical History: Past Surgical History  Procedure Laterality Date  .  Abdominal hysterectomy    . Cholecystectomy      Family History: Family History  Problem Relation Age of Onset  . Heart disease Mother   . Cancer Maternal Aunt 69    breast cancer  . Cancer Cousin 40    breast cancer    Social History History  Substance Use Topics  . Smoking status: Former Smoker    Quit date: 07/19/1987  . Smokeless tobacco: Never Used  . Alcohol Use: No    Allergies: Allergies  Allergen Reactions  . Latex     Current Medications: Current Outpatient Prescriptions  Medication Sig Dispense Refill  . amLODipine (NORVASC) 10 MG tablet Take 10 mg by mouth daily.      . Azilsartan-Chlorthalidone 40-25 MG TABS Take by mouth.      [name] buPROPion (WELLBUTRIN SR) 150 MG 12 hr tablet Take 150 mg by mouth 2 (two) times daily.      . hydrALAZINE (APRESOLINE) 25 MG tablet Take 25 mg by mouth 2 (two) times daily.      . metoprolol tartrate (LOPRESSOR) 25 MG tablet Take 25 mg by mouth 2 (two) times daily.      . pantoprazole (PROTONIX) 40 MG tablet Take 40 mg by mouth daily.      . pioglitazone-metformin (ACTOPLUS MET) 15-850 MG per tablet Take 1 tablet by mouth 2 (two) times daily with a meal.      . rosuvastatin (CRESTOR) 10 MG tablet Take 5 mg by mouth daily.       . sitaGLIPtin (JANUVIA) 100 MG tablet Take 100  mg by mouth daily.       No current facility-administered medications for this visit.    OB/GYN History:menarche at 8, menopause 1997 surgical , no, no children, G2P0  Fertility Discussion: n/a Prior History of Cancer: no  Health Maintenance:  Colonoscopy no Bone Density no Last PAP smear 1996  ECOG PERFORMANCE STATUS: 0 - Asymptomatic  Genetic Counseling/testing:yes  REVIEW OF SYSTEMS:  Patient does complain of having insomnia as well as irregular heartbeat fainting spells depression and bipolar disorder diabetes and hot flashes. Remainder of the 14 point review of systems is unremarkable.  PHYSICAL EXAMINATION: Blood pressure 131/65, pulse  82, temperature 98.3 F (36.8 C), temperature source Oral, resp. rate 20, weight 205 lb 14.4 oz (93.396 kg).  AVW:UJWJX, healthy, no distress, well nourished and well developed SKIN: skin color, texture, turgor are normal HEAD: Normocephalic EYES: PERRLA, EOMI, Conjunctiva are pink and non-injected EARS: External ears normal OROPHARYNX:no exudate, no erythema and lips, buccal mucosa, and tongue normal  NECK: supple, no adenopathy, thyroid normal size, non-tender, without nodularity LYMPH:  no palpable lymphadenopathy, no hepatosplenomegaly BREAST:right breast normal without mass, skin or nipple changes or axillary nodes, abnormal mass palpable in the right breast as well as a palpable lymph node LUNGS: clear to auscultation and percussion HEART: regular rate & rhythm ABDOMEN:abdomen soft, non-tender, obese, normal bowel sounds and no masses or organomegaly BACK: Back symmetric, no curvature., No CVA tenderness EXTREMITIES:no edema, no clubbing, no cyanosis  NEURO: alert & oriented x 3 with fluent speech, no focal motor/sensory deficits, gait normal, reflexes normal and symmetric     STUDIES/RESULTS: US Breast Bilateral  03/28/2013   *RADIOLOGY REPORT*  Clinical Data:  Patient with recent diagnosis of left breast cancer status post recent bilateral breast MRI.  The patient presents for evaluation of the left breast to help define the extent of disease as well as for second look ultrasound of the right breast to attempt to locate/biopsy the area of non mass enhancement within the right breast on prior MRI.  Additionally there were questioned lymph nodes within the right axilla.  BILATERAL BREAST ULTRASOUND  Comparison:  Breast MRI 03/15/2013  On physical exam, no definite discrete masses are able to be palpated within either breast.  Findings:  Ultrasound is performed of the left breast and the previously identified/biopsied irregular shadowing mass was able to be identified.  The patient has  dense parenchymal tissue with areas of posterior acoustic scattering scattered throughout the breast making visualization of any discrete additional masses identified on prior MRI difficult.  Ultrasound evaluation of the right breast did not reveal any correlate for the described non mass enhancement centrally.  Evaluation of the right axilla demonstrated a few lymph nodes without significantly thickened cortices.  IMPRESSION: 1. Previously biopsied mass within the left breast is able to be identified with ultrasound. Additional areas identified on the recent MRI are not able to be discretely identified likely secondary to diffuse shadowing parenchymal tissue throughout the left breast.  If true evaluation of the extent of disease is desired for possible breast conservation therapy, the most accurate assessment would be MRI guided biopsy of the anterior most aspect of the abnormal enhancement within the left breast.  2.  No definite ultrasound correlate was identified within the right breast to correspond with non mass enhancement identified on recent prior MRI.  Recommend right breast MRI guided biopsy for further evaluation.  RECOMMENDATION: Bilateral MRI guided core needle biopsies.  These have been scheduled for 04/03/2013 at  7:15 a.m.  I have discussed the findings and recommendations with the patient. Results were also provided in writing at the conclusion of the visit.  If applicable, a reminder letter will be sent to the patient regarding the next appointment.  BI-RADS CATEGORY 4:  Suspicious abnormality - biopsy should be considered.   Original Report Authenticated By: Annia Belt, M.D   Mr Breast Bilateral W Wo Contrast  03/15/2013   **ADDENDUM** CREATED: 03/15/2013 14:02:33  I discussed this case with Dr. Derrell Lolling over the phone.  In light of the fact that the patient has bipolar disorder, Dr. Derrell Lolling would like [name] to discuss the options of mastectomy versus lumpectomy of the left breast with the patient  returns for second look ultrasound and possible biopsy.   Addended by:  Sherian Rein, M.D. on 03/15/2013 14:02:33.  **END ADDENDUM** SIGNED BY: Sherian Rein, M.D.  03/15/2013   *RADIOLOGY REPORT*  Clinical Data:  Recent diagnosis of left breast cancer.  The patient has had prior benign biopsies of both breasts.  The patient has a bipolar disorder.  MRI BILATERAL BREAST WITHOUT AND WITH CONTRAST  Technique: Multiplanar, multisequence MR images of the right breast were obtained prior to and following the intravenous administration of 14ml of multi hance.  Labs were obtained at [facility].  BUN is 18. Creatinine is 1.0  Comparison:  Recent imaging examinations.  FINDINGS:  Breast composition:  c:  Heterogeneous fibroglandular tissue  Background parenchymal enhancement:  Mild  Left breast:  There are at least four focus of abnormal spiculated enhancement demonstrating washout kinetics in the upper left breast centrally.   The entirety area measures 7.4 cm transverse x 6.6 cm anterior-posterior x 5.1 cm cranial-caudal. The recently biopsy focus is posteriorly located measuring 2.1 x 1 x 2.1 cm.  The anterior focus that was not recently biopsies measures 3.1 x 2.6 x 2.6 cm.  There is a lateral focus not recently biopsied measuring 1.1 x 1.4 x 1.6 cm.  There is a small medial focus measuring cysts 0.7 x 0.8 x 0.7 cm.  Post biopsy changes are identified in the left axilla.  There are abnormal enlarged lymph nodes correlating to the patient's known metastatic neoplasm to the lymph nodes with the largest lymph node measuring 1.8 x 0.8 cm.  Right breast:   There is an irregular focus of 3.3 x 3.1 x 1.3 cm abnormal enhancement in the central right breast in the middle one third demonstrating washout kinetics.  There are mild thickened cortex and prominent lymph nodes within the right axilla, largest measures 1.6 x 1 cm.  There is a posterior central upper breast focus with associated clip artifact measuring 1.2  x 2.2 x 1 cm previously biopsied to be benign.  IMPRESSION:  Impression: Suspicious findings in bilateral breasts.  1. Several abnormal enhancing foci encompassing 7.4 x 6.6 x 5.1 cm area of the upper left breast. The posterior focus was most recently biopsy to be cancer.   2. Abnormal enhancing focus in the central middle one third of right breast and mild prominent right axillary lymph nodes.  RECOMMENDATION: 1. If the patient is interested left breast conservation, then a second look ultrasound with biopsy of the most anterior focus is recommended to assess the extent of disease in the left breast.  2.  Recommend a second look ultrasound with biopsy of the right breast mass and right axillary lymph node.  If the area within the right breast cannot be found on  ultrasound, an MR guided core biopsy is recommended.  BI-RADS CATEGORY 5:  Highly suggestive of malignancy - appropriate action should be taken.  THREE-DIMENSIONAL MR IMAGE RENDERING ON INDEPENDENT WORKSTATION:  Three-dimensional MR images were rendered by post-processing of the original MR data on a DynaCad workstation.  The three-dimensional MR images were interpreted, and findings were reported in the accompanying complete MRI report for this study.   Original Report Authenticated By: Sherian Rein, M.D.   Mm Digital Diagnostic Bilat  04/03/2013   *RADIOLOGY REPORT*  Clinical Data:  Known left breast cancer.  Abnormal enhancement seen in both breast with MR imaging.  DIGITAL DIAGNOSTIC BILATERAL MAMMOGRAM  Comparison:  Previous exams.  Findings:  Mammographic images were obtained following MR guided core biopsies of both breast. A cylindrical shaped clip is seen in the central portion of the right breast.  A cylindrical clip is seen in the anterior aspect of the left breast.  The clip is located 6.4 cm anterior inferior to the ribbon shaped clip.  IMPRESSION: Status post MR guided core biopsies of both breast with pathology pending.  Final Assessment:   Post Procedure Mammograms for Marker Placement   Original Report Authenticated By: Baird Lyons, M.D.   Mm Digital Diagnostic Unilat L  03/07/2013   *RADIOLOGY REPORT*  Clinical Data: Patient is post ultrasound guided core biopsy of a 2.9 cm mass over the 12:30 position of the left breast as well as biopsy of a borderline abnormal left axillary lymph node.  ULTRASOUND GUIDED POST-BIOPSY CLIP PLACEMENT LEFT  Comparison:  Previous exams.  Findings: Exam demonstrates a ribbon shaped metallic clip along the posterior edge of patient's biopsied mass at the 12:30 position of the left breast.  IMPRESSION: Satisfactory clip placement post ultrasound guided core biopsy left breast.   Original Report Authenticated By: Elberta Fortis, M.D.   [name] Lt Breast Bx W Loc Dev 1st Lesion Img Bx Spec US Guide  03/08/2013   **ADDENDUM** CREATED: 03/08/2013 14:11:52  I spoke with the patient by telephone on March 08, 2013 to discuss pathology results.  Pathology demonstrates invasive carcinoma at the biopsy site in the left breast as well as metastatic disease within the biopsied left axillary lymph node.  The patient reports no problems at the biopsy site.  All questions were answered.  Recommendations:  Surgical consultation is recommended and has been scheduled for March 15, 2013 at 11:15 a.m. with Dr. Derrell Lolling.  Additionally the patient is scheduled to have a breast MRI on March 15, 2013 at 8:30 a.m.  **END ADDENDUM** SIGNED BY: Antonieta Loveless, M.D  03/07/2013   *RADIOLOGY REPORT*  Clinical Data:  Patient presents for ultrasound-guided core biopsy of a suspicious 2.9 cm mass at the 12:30 position of the left breast as well as a borderline left axillary lymph node.  ULTRASOUND GUIDED VACUUM ASSISTED CORE BIOPSY OF THE LEFT BREAST  Comparison: Previous exams.  I met with the patient and we discussed the procedure of ultrasound- guided biopsy, including benefits and alternatives.  We discussed the high likelihood of a successful  procedure. We discussed the risks of the procedure including infection, bleeding, tissue injury, clip migration, and inadequate sampling.  Informed written consent was given. The usual time-out protocol was performed immediately prior to the procedure.  Using sterile technique and 2% Lidocaine as local anesthetic, under direct ultrasound visualization, a 13 gauge vacuum-assisted device was used to perform biopsy of the targeted 2.9 cm mass at the 12:30 position using a lateral to medial approach.  At the conclusion of the procedure, a ribbon shaped tissue marker clip was deployed into the biopsy cavity.  Follow-up 2-view mammogram was performed and dictated separately.  I met with the patient and we discussed the procedure of ultrasound- guided biopsy, including benefits and alternatives.  We discussed the high likelihood of a successful procedure. We discussed the risks of the procedure including infection, bleeding, tissue injury, clip migration, and inadequate sampling.  Informed written consent was given. The usual time-out protocol was performed immediately prior to the procedure.  Using sterile technique and 2% Lidocaine as local anesthetic, under direct ultrasound visualization, a 13 gauge vacuum-assisted device was used to perform biopsy of the targeted borderline abnormal left axillary lymph node using a lateral to medial approach.  No clip was placed into the lymph node.  The patient tolerated the procedure without complications.  IMPRESSION: Ultrasound-guided biopsy of the a suspicious 2.9 cm mass at the 12:30 position of the left breast as well as a borderline abnormal left axillary lymph node.  Original Report Authenticated By: Elberta Fortis, M.D.   [name] Lt Breast Bx W Loc Dev Ea Add Lesion Img Bx Spec US Guide  03/08/2013   **ADDENDUM** CREATED: 03/08/2013 14:11:52  I spoke with the patient by telephone on March 08, 2013 to discuss pathology results.  Pathology demonstrates invasive carcinoma at the  biopsy site in the left breast as well as metastatic disease within the biopsied left axillary lymph node.  The patient reports no problems at the biopsy site.  All questions were answered.  Recommendations:  Surgical consultation is recommended and has been scheduled for March 15, 2013 at 11:15 a.m. with Dr. Derrell Lolling.  Additionally the patient is scheduled to have a breast MRI on March 15, 2013 at 8:30 a.m.  **END ADDENDUM** SIGNED BY: Antonieta Loveless, M.D  03/07/2013   *RADIOLOGY REPORT*  Clinical Data:  Patient presents for ultrasound-guided core biopsy of a suspicious 2.9 cm mass at the 12:30 position of the left breast as well as a borderline left axillary lymph node.  ULTRASOUND GUIDED VACUUM ASSISTED CORE BIOPSY OF THE LEFT BREAST  Comparison: Previous exams.  I met with the patient and we discussed the procedure of ultrasound- guided biopsy, including benefits and alternatives.  We discussed the high likelihood of a successful procedure. We discussed the risks of the procedure including infection, bleeding, tissue injury, clip migration, and inadequate sampling.  Informed written consent was given. The usual time-out protocol was performed immediately prior to the procedure.  Using sterile technique and 2% Lidocaine as local anesthetic, under direct ultrasound visualization, a 13 gauge vacuum-assisted device was used to perform biopsy of the targeted 2.9 cm mass at the 12:30 position using a lateral to medial approach. At the conclusion of the procedure, a ribbon shaped tissue marker clip was deployed into the biopsy cavity.  Follow-up 2-view mammogram was performed and dictated separately.  I met with the patient and we discussed the procedure of ultrasound- guided biopsy, including benefits and alternatives.  We discussed the high likelihood of a successful procedure. We discussed the risks of the procedure including infection, bleeding, tissue injury, clip migration, and inadequate sampling.  Informed  written consent was given. The usual time-out protocol was performed immediately prior to the procedure.  Using sterile technique and 2% Lidocaine as local anesthetic, under direct ultrasound visualization, a 13 gauge vacuum-assisted device was used to perform biopsy of the targeted borderline abnormal left axillary lymph node using a lateral to medial approach.  No clip was placed into the lymph node.  The patient tolerated the procedure without complications.  IMPRESSION: Ultrasound-guided biopsy of the a suspicious 2.9 cm mass at the 12:30 position of the left breast as well as a borderline abnormal left axillary lymph node.  Original Report Authenticated By: Elberta Fortis, M.D.   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  This is concordant.  The patient has an appointment with oncology today and with Dr. Derrell Lolling on 04/08/13.  The patient and I discussed these results and appointments by phone today at 11:00.  She stated she was doing fine after the biopsy.  **END ADDENDUM** SIGNED BY: Esperanza Heir, M.D.  04/03/2013   *RADIOLOGY REPORT*  Clinical Data:  Known left breast cancer.  Abnormal MRI.  Evaluate extent of disease.  MRI GUIDED VACUUM ASSISTED BIOPSY OF THE LEFT BREAST WITHOUT AND WITH CONTRAST  Comparison: Previous exams.  Technique: Multiplanar, multisequence MR images of both breast were obtained prior to and following the intravenous administration of 19 ml of Mulithance.  I met with the patient, and we discussed the procedure of MRI guided biopsy, including risks, benefits, and alternatives. Specifically, we discussed the risks of infection, bleeding, tissue injury, clip migration, and inadequate sampling.  Informed, written consent was given.  Using sterile technique, 2% Lidocaine, MRI guidance, and a 9 gauge vacuum assisted device, biopsy was performed  of abnormal enhancement in the anterior aspect of the left breast using a lung approach.  At the conclusion of the procedure, a cylindrical show tissue marker clip was deployed into the biopsy cavity.  IMPRESSION: MRI guided biopsy of the left breast. No apparent complications.  THREE-DIMENSIONAL MR IMAGE RENDERING ON INDEPENDENT WORKSTATION:  Three-dimensional MR images were rendered by post-processing of the original MR data on a DynaCad workstation.  The three-dimensional MR images were interpreted, and findings were reported in the accompanying complete MRI report for this study.   Original Report Authenticated By: Baird Lyons, M.D.   Mr Rt Breast Bx Jones Bales Dev 1st Lesion Image Bx Spec Mr Guide  04/03/2013   *RADIOLOGY REPORT*  Clinical Data:  Known left breast cancer.  Abnormal enhancement seen in the right breast.  MRI GUIDED VACUUM ASSISTED BIOPSY OF THE RIGHT BREAST WITHOUT AND WITH CONTRAST  Comparison: Previous exams.  Technique: Multiplanar, multisequence MR images of both breast were obtained prior to and following the intravenous administration of 19 ml of Mulithance.  I met with the patient, and we discussed the procedure of MRI guided biopsy, including risks, benefits, and alternatives. Specifically, we discussed the risks of infection, bleeding, tissue injury, clip migration, and inadequate sampling.  Informed, written consent was given.  Using sterile technique, 2% Lidocaine, MRI guidance, and a 9 gauge vacuum assisted device, biopsy was performed of abnormal enhancement in the central portion of the right breast using a lateral approach.  At the conclusion of the procedure, a cylindrical shaped tissue marker clip was deployed into the biopsy cavity.  IMPRESSION: MRI guided biopsy of the right breast. No apparent complications.  THREE-DIMENSIONAL MR IMAGE RENDERING ON INDEPENDENT WORKSTATION:  Three-dimensional MR images were rendered by post-processing of the original MR data on a DynaCad  workstation.  The three-dimensional MR images were interpreted, and findings were reported in the accompanying complete MRI report for this study.   Original Report Authenticated By:  Baird Lyons, M.D.     LABS:    Chemistry      Component Value Date/Time   NA 140 04/04/2013 1513   K 4.6 04/04/2013 1513   CO2 29 04/04/2013 1513   BUN 24.8 04/04/2013 1513   CREATININE 1.3* 04/04/2013 1513      Component Value Date/Time   CALCIUM 9.7 04/04/2013 1513   ALKPHOS 93 04/04/2013 1513   AST 15 04/04/2013 1513   ALT 11 04/04/2013 1513   BILITOT 0.33 04/04/2013 1513      Lab Results  Component Value Date   WBC 5.9 04/04/2013   HGB 11.3* 04/04/2013   HCT 34.0* 04/04/2013   MCV 90.8 04/04/2013   PLT 280 04/04/2013   PATHOLOGY: ADDITIONAL INFORMATION: 1. PROGNOSTIC INDICATORS - ACIS Results: IMMUNOHISTOCHEMICAL AND MORPHOMETRIC ANALYSIS BY THE AUTOMATED CELLULAR IMAGING SYSTEM (ACIS) Estrogen Receptor: 100%, POSITIVE, STRONG STAINING INTENSITY Progesterone Receptor: 98%, POSITIVE, STRONG STAINING INTENSITY Proliferation Marker Ki67: 13% REFERENCE RANGE ESTROGEN RECEPTOR NEGATIVE <1% POSITIVE =>1% PROGESTERONE RECEPTOR NEGATIVE <1% POSITIVE =>1% All controls stained appropriately Pecola Leisure MD Pathologist, Electronic Signature ( Signed 03/13/2013) 1. CHROMOGENIC IN-SITU HYBRIDIZATION Results: HER-2/NEU BY CISH - NO AMPLIFICATION OF HER-2 DETECTED. RESULT RATIO OF HER2: CEP 17 SIGNALS 1.30 AVERAGE HER2 COPY NUMBER PER CELL 1.95 REFERENCE RANGE 1 of 4 Duplicate copy FINAL for AUNE, ADAMI ([id number]) ADDITIONAL INFORMATION:(continued) NEGATIVE HER2/Chr17 Ratio <2.0 and Average HER2 copy number <4.0 EQUIVOCAL HER2/Chr17 Ratio <2.0 and Average HER2 copy number 4.0 and <6.0 POSITIVE HER2/Chr17 Ratio >=2.0 and/or Average HER2 copy number >=6.0 Pecola Leisure MD Pathologist, Electronic Signature ( Signed 03/12/2013) 2. PROGNOSTIC INDICATORS - ACIS Results: IMMUNOHISTOCHEMICAL AND  MORPHOMETRIC ANALYSIS BY THE AUTOMATED CELLULAR IMAGING SYSTEM (ACIS) Estrogen Receptor: 100%, POSITIVE, STRONG STAINING INTENSITY Progesterone Receptor: 75%, POSITIVE, STRONG STAINING INTENSITY Proliferation Marker Ki67: 15% REFERENCE RANGE ESTROGEN RECEPTOR NEGATIVE <1% POSITIVE =>1% PROGESTERONE RECEPTOR NEGATIVE <1% POSITIVE =>1% All controls stained appropriately Pecola Leisure MD Pathologist, Electronic Signature ( Signed 03/13/2013) 2. CHROMOGENIC IN-SITU HYBRIDIZATION Results: HER-2/NEU BY CISH - NO AMPLIFICATION OF HER-2 DETECTED. RESULT RATIO OF HER2: CEP 17 SIGNALS 0.86 AVERAGE HER2 COPY NUMBER PER CELL 1.85 REFERENCE RANGE NEGATIVE HER2/Chr17 Ratio <2.0 and Average HER2 copy number <4.0 EQUIVOCAL HER2/Chr17 Ratio <2.0 and Average HER2 copy number 4.0 and <6.0 POSITIVE HER2/Chr17 Ratio >=2.0 and/or Average HER2 copy number >=6.0 2 of 4 Duplicate copy FINAL for NEVE, BRANSCOMB ([id number]) ADDITIONAL INFORMATION:(continued) Pecola Leisure MD Pathologist, Electronic Signature ( Signed 03/12/2013) FINAL DIAGNOSIS Diagnosis 1. Breast, left, needle core biopsy, mass, 12:30 o'clock - INVASIVE MAMMARY CARCINOMA, SEE COMMENT. 2. Lymph node, needle/core biopsy, borderline left axillary - ONE LYMPH NODE POSITIVE FOR METASTATIC MAMMARY CARCINOMA (1/1), SEE COMMENT. Microscopic Comment 1. Although definitive grading and typing of breast carcinoma is best done on excision, the features of the tumor from the left 12 o'clock mass are compatible with a grade I-II breast carcinoma and a ductal carcinoma is favored. Breast prognostic markers will be performed and reported in an addendum. Findings are called to [facility] on 03/08/2013. Dr. Luisa Hart has seen the left 12:30 o'clock biopsy in consultation with agreement. 2. The metastatic mammary carcinoma seen in the left axillary lymph node biopsy is consistent with a macrometastasis and extracapsular extension is  identified. A breast prognostic profile will be performed on the tumor and the lymph node and reported in an addendum. The findings are called to [facility] on 03/08/2013. Dr. Luisa Hart has seen the lymph node  biopsy in consultation with agreement. (RH:ecj 03/08/2013) Zandra Abts MD Pathologist, Electronic Signature (Case signed 03/08/2013) Specimen Gross and Clinical Information Specimen Comment 1. 2.9 cm irreg mass at 12:30, in formalin 11:45 and 12 o'clock, extracted <1 min Specimen(s) Obtained: 1. Breast, left, needle core biopsy, mass, 12:30 o'clock 2. Lymph node, needle/core biopsy, borderline left axillary Specimen Clinical Information 1. Likely IMC 2. R/O MET Diagnosis 1. Breast, left, needle core biopsy, anterior - INVASIVE DUCTAL CARCINOMA. - DUCTAL CARCINOMA IN SITU. - SEE COMMENT. 2. Breast, right, needle core biopsy, central - DUCTAL CARCINOMA IN SITU. - SEE COMMENT. Microscopic Comment 1. Although definitive grading of breast carcinoma is best done on excision, the features of the invasive tumor from the left anterior needle core biopsy are compatible with a grade II breast carcinoma. Breast prognostic markers will be performed and reported in an addendum. Findings are called to [facility] on 04/04/2013. Dr. Frederica Kuster has seen this case in consultation with agreement. 2. 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 A quantitative estrogen receptor and progesterone receptor will be performed and reported in an addendum. The findings are called to [facility] on 04/04/2013. Dr. Frederica Kuster has seen this case in consultation with agreement. 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 On MRI patient was found to have right-sided breast cancer also. 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 #2 patient and I discussed all of her pathology is today. I do think that she is a good mastectomy candidate on the left side to 2 multiple foci of invasive disease as well as ductal carcinoma in situ and extent of disease. Certainly she will need a full axillary lymph node dissection. We discussed the possibility of her seeing a Engineer, petroleum for reconstruction purposes.  #2 patient will also need a lumpectomy or even a mastectomy on the right side. We discussed a lot about lumpectomy on the right side. However my suspicion is that patient may opt to have bilateral mastectomies although she did not say anything. She is due to see Dr. Claud Kelp on Monday and I think she will discuss her definitive surgical options with him.  #3 in the meantime I have suggested that patient be seen by our genetic counselor and have genetic counseling and testing performed for BRCA1 and BRCA2 gene mutation.  #3 patient and I discussed treatment with chemotherapy. She will need FEC followed by Taxol. I would like for her to have a Port-A-Cath placed at the time of her surgeries.  #5 we discussed getting chemotherapy teaching class, echocardiogram, and staging studies performed today.  Clinical Trial Eligibility:no Multidisciplinary conference discussion yes     PLAN:    #1 proceed with staging studies occluded and PET/CT  #2 proceed with definitive surgery with a mastectomy on the left side with axillary lymph node dissection and a lumpectomy on  the right side with sentinel lymph node biopsy probably.  #3 prefer to  Dentist.  #4 patient will need chemotherapy education class as well as Port-A-Cath placed and echocardiogram       Discussion: Patient is being treated per NCCN breast cancer care guidelines appropriate for stage.III   Thank you so much for allowing me to participate in the care of Bronson Ing. I will continue to follow up the patient with you and assist in her care.  All questions were answered. The patient knows to call the clinic with any problems, questions or concerns. We can certainly see the patient much sooner if necessary.  I spent 60 minutes counseling the patient face to face. The total time spent in the appointment was 60 minutes.  Drue Second, MD Medical/Oncology [facility] [phone] (beeper) [phone] (Office)

## 2013-04-04 NOTE — Patient Instructions (Addendum)
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  We discussed genetic counseling and testing for the breast cancer gene  We discussed treatment options including preop chemotherapy versus postop chemotherapy. We discussed that you will need chemotherapy. Therefore you will need a Port-A-Cath placed as well.  You will need staging studies including PET CT.  Because we are giving you an anthracycline regimen you will need an echocardiogram.  I will plan on see you in one month or sooner if need arises   Breast Cancer, Early Stage, Surgery Choices Breast cancer is the most common cancer, except for skin cancer. It is the second most common cause of death, except for lung cancer, in women. The risk of a woman developing breast cancer is 12.5%. Breast cancer in women younger than 59 years old is rare. Most of these cancer cases have spread to the breast from another part of the body (metastatic). Finding out that you have breast cancer is an emotional shock and is difficult to understand, because it is an overwhelming, serious, and life-threatening disease. You will need to make important decisions about how you want it treated.  As a woman with early-stage breast cancer (DCIS or Stage I, IIA, IIB, IIIA, or IV) you may be able to choose which type of breast surgery to have. Your caregiver will help you understand what stage you are in. Often, your choices are:  Removal of the cancerous part(s) of the breast (breast-sparing surgery).  Lumpectomy.  Partial/Segmental mastectomy.  Removal of the whole breast (simple mastectomy). Research shows that women with early-stage breast cancer who have breast-sparing surgery along with radiation therapy live as long as those who have a mastectomy. Most women with breast cancer will lead long, healthy lives after treatment.  Reconstructive surgery. This type of surgery is to make the breast and nipple area as normal  looking as it was before the mastectomy. It is usually done by a plastic surgeon at the same time as the mastectomy. There are several types of reconstructive surgery that should be discussed with your caregiver and plastic surgeon.  Lymph node surgery.  Sentinel (removing those lymph nodes in the armpit that breast cancer spreads to first).  Axillary lymph node dissection (removing all the lymph nodes in the armpit). TREATMENT Treatment for breast cancer usually begins a few weeks after diagnosis. In these weeks, you should meet with a surgeon, learn the facts about your surgery choices, treatment options, get a second opinion, and think about what is important to you.  Treatment choices include:  Surgery.  Radiation.  Chemotherapy.  Medicines, hormones.  Reconstructive breast surgery.  Any combination of the above treatments. Choose which kind of surgery to have. If radiation, chemotherapy, or hormone therapy is considered, this also should be discussed before the surgery. Radical breast surgery is rarely performed now. Usually, lumpectomy, partial/segmental mastectomy, simple mastectomy in combination with radiation, chemotherapy, and/or hormone treatment is recommended. Clinical trial protocols (specific treatment plan with surgery and radiation, chemotherapy, and hormones) for breast cancer have been put in place in hospitals, universities, medical schools, and cancer centers all over the country. These patients are followed for several years to find out what treatment gives the best results for the protocol given, for the different stages of breast cancer.  Most women want to make this choice with help from their caregiver. After all, the kind of surgery you have will affect how you look and feel. But it is often hard to  decide what to do. The more information you have, the better the decision you can make.  Talk to a surgeon about your breast cancer surgery choices. Find out what  happens during surgery, types of problems that sometimes occur, and other kinds of treatment (if any) you will need after surgery. Be sure to ask a lot of questions and learn as much as you can. You may also wish to talk with family members, friends, or others who have had breast cancer surgery, radiation therapy, chemotherapy, or hormone therapy.  After talking with a surgeon, you may want a second opinion. This means talking with another doctor or surgeon who might tell you about other treatment options. They may simply give you information that can help you feel better about the choice you are making. Do not worry about hurting your surgeon's feelings. It is common practice to get a second opinion, and some insurance companies require it. Plus, it is better to get a second opinion than to worry that you have made the wrong choice. STAGES OF BREAST CANCER Staging of breast cancer depends on the size of the tumor, if and how far it has spread, lymph node involvement in the armpits, and whether it has spread to other parts of the body. If you are unsure of the stage of your cancer, ask your caregiver. The following are the stages of breast cancer:  Stage 0: This means you either have DCIS or LCIS.  DCIS (Ductal Carcinoma in Situ) is very early breast cancer. It is often too small to form a lump. Your caregiver may refer to DCIS as noninvasive cancer.  LCIS (Lobular Carcinoma in Situ) is not cancer, but it may increase the chance that you will get breast cancer. Talk with your caregiver about treatment options, if you are diagnosed with LCIS.  The 5 year survival rate in this stage is almost 100%.  Stage I:  Your cancer is less than 1 inch across (2 centimeters) or about the size of a quarter. The cancer is only in the breast. It has not spread to lymph nodes or other parts of your body.  Stage IIA:  No cancer is found in your breast. However, cancer is found in the lymph nodes under your arm,  or  Your cancer is 1 inch (2 centimeters) or smaller. It has spread to 3 lymph nodes or less (the lymph nodes under your arm), or  Your cancer is about 1-2 inches (2-5 centimeters). It has not spread to the lymph nodes under your arm.  Stage IIB:  Your cancer is about 1-2 inches (2-5 centimeters). It has spread to the lymph nodes under your arm, or  Your cancer is larger than 2 inches (5 centimeters). It has not spread to the lymph nodes under your arm.  The 5 year survival rate is 85 to 90%.  Stage IIIA:  No cancer is found in the breast. It is found in lymph nodes under your arm. The lymph nodes are attached to each other, or  Your cancer is 2 inches (5 centimeters) or smaller. It has spread to lymph nodes under your arm. The lymph nodes are attached to each other, or  Your cancer is larger than 2 inches (5 centimeters) and has spread to lymph nodes under your arm, and they are not attached to each other.  Your cancer is smaller than 2 inches (5 centimeters) and has spread to lymph nodes above your collarbone.  Inflammatory cancer of the breast (red  rash, tender and swollen breasts) is in the stage III category.  The 5 year survival rate is 45 to 67%.  Stage IV:  Cancer cells have spread to other parts of the body.  The 5 year survival rate is about 20%. ABOUT LYMPH NODES  Lymph nodes are part of your body's immune system, which helps fight infection and disease. Lymph nodes are small, round, and clustered (like a bunch of grapes) throughout your body.  Axillary lymph nodes are in the area under your arm. Breast cancer may spread to these lymph nodes first, even when the tumor in the breast is small. This is why most surgeons take out some of these lymph nodes at the time of breast surgery.  Lymphedema is swelling caused by a buildup of lymph fluid. You may have this type of swelling in your arm, if your lymph nodes are taken out with surgery or damaged by radiation  therapy.  Lymphedema can show up soon after surgery. The symptoms are often mild and last for a short time.  Lymphedema can show up months or even years after cancer treatment is over. Often, lymphedema develops after an insect bite, minor injury, or burn on the arm where your lymph nodes were removed. Sometimes, this can be painful. One way to reduce the swelling is to work with a caregiver who specializes in rehabilitation, or with a physical therapist.  Sentinel lymph node biopsy is surgery to remove as few lymph nodes as possible from under the arm. The surgeon first injects a dye in the breast to see which lymph nodes the breast tumor drains into. Then, he or she removes these nodes to see if they have any cancer. If there is no cancer, the surgeon may leave the other lymph nodes in place. This surgery is fairly new and is being studied experimentally. Talk with your surgeon if you want to learn more. COMPARE YOUR CHOICES  BREAST-SPARING SURGERY  Is this surgery right for me?   Breast-sparing surgery with radiation is a safe choice for most women who have early-stage breast cancer. This means that your cancer is DCIS or at Stage I, IIA, IIB, or IIIA. What are the names of the different kinds of surgery?  Lumpectomy.  Partial mastectomy.  Breast-sparing surgery.  Segmental mastectomy.  Simple mastectomy.  Sentinel lymph node removal.  Axillary lymph node excision. What doctors am I likely to see?  Oncologist.  Surgeon.  Radiation Oncologist.  Chemotherapy Oncologist. What will my breast look like after surgery?  Your breast should look a lot like it did before surgery. But if your tumor is large, your breast may look different or smaller after breast-sparing surgery. Will I have feeling in the area around my breast?  Yes. You should still have feeling in your breast, nipple, and areola (dark area around your nipple). Will I have pain after the surgery?  You may have  pain after surgery. Talk with your caregiver about ways to control this pain. What other problems can I expect?  You may feel very tired after radiation therapy. You may get swelling in your arm (lymphedema). Will I need more surgery?  Maybe. You may need more surgery to remove lymph nodes from under your arm. Also, if the surgeon does not remove all your cancer the first time, you may need more surgery. What other types of treatment will I need?  You may need radiation therapy, given almost every day for 5 to 8 weeks. You may also  need chemotherapy, hormone therapy, or both. Will insurance pay for my surgery?  Check with your insurance company to find out how much it pays for breast cancer surgery and other needed treatments. Will the type of surgery I have affect how long I live?  Women with early-stage breast cancer who have breast-sparing surgery followed by radiation live just as long as women who have a mastectomy. Most women with breast cancer will lead long, healthy lives after treatment. What are the chances that my cancer will come back after surgery?  About 10% of women who have breast-sparing surgery along with radiation therapy get cancer in the same breast within 12 years. If this happens, you will need a mastectomy, but it will not affect how long you live. MASTECTOMY SURGERY Is this surgery right for me?   Mastectomy is a safe choice for women who have early-stage breast cancer (DCIS, Stage I, IIA, IIB, or IIIA). You may need a mastectomy if:  You have small breasts and a large tumor.  You have cancer in more than one part of your breast.  The tumor is under the nipple.  You do not have access to radiation therapy. What are the names of the different kinds of surgery?  Total mastectomy.  Modified radical mastectomy (not usually done now).  Double mastectomy.  Simple mastectomy. What doctors am I likely to see?  Oncologist.  Surgeon.  Radiation  Oncologist.  Chemotherapy Oncologist. What will my breast look like after surgery?  Your breast and nipple will be removed. You will have a flat chest on the side of your body where the breast was removed. Will I have feeling in the area around my breast?  Maybe. After surgery, you will have no feeling (numb) in your chest wall and maybe also under your arm. This numb feeling should go away in 1-2 years, but it will never feel like it did before. Also, the skin where your breast was may feel tight. Will I have pain after the surgery?  You may have pain after surgery. Talk with your caregiver about ways to control this pain. What other problems can I expect?  You may have pain in your neck or back. You may feel out of balance, if you had large breasts and do not have reconstruction surgery. You may get swelling in your arm (lymphedema). Will I need more surgery?  Maybe. You may need surgery to remove lymph nodes from under your arm. Also, if you have problems after your mastectomy, you may need to see your surgeon for treatment. What other types of treatment will I need?  You may also need chemotherapy, hormone therapy, or radiation therapy. Some women get all 3 types of therapy or any combination of the 3. Will insurance pay for my surgery?  Check with your insurance company to find out how much it pays for breast cancer surgery and other needed treatments. Will the type of surgery I have affect how long I live?  Women with early-stage breast cancer who have a mastectomy live as long as women who have breast-sparing surgery followed by radiation therapy. Most women with breast cancer will lead long, healthy lives after treatment. What are the chances that my cancer will come back after surgery?  About 5% of women who have a mastectomy will get cancer on the same side of their chest within 12 years. MASTECTOMY AND BREAST RECONSTRUCTION SURGERY  Is this surgery right for me?  If you  have a mastectomy,  you might also want breast reconstruction surgery.  You can choose to have reconstruction surgery at the same time as your mastectomy.  You can wait and have it at a later date. What are the names of the different kinds of surgery?  Breast implant.  Tissue flap surgery.  Reconstructive plastic surgery. What doctors am I likely to see?  Oncologist.  Surgeon.  Radiation Oncologist.  Reconstructive Plastic Surgeon.  Chemotherapy Oncologist. What will my breast look like after surgery?  Although you will have a breast-like shape, your breast will not look the same as it did before surgery. Will I have feeling in the area around my breast?  No. The area around your breast will always be numb (have no feeling). Will I have pain after the surgery?  You are likely to have pain after major surgery, such as mastectomy and reconstruction surgery. There are many ways to deal with pain. Let your caregiver know if you need relief from pain. What other problems can I expect?  It may take you many weeks or even months to recover from breast reconstruction surgery. If you have an implant, you may get infections, pain, or hardness. Also, you may not like how your breast-like shape looks. You may need more surgery if your implant breaks or leaks. If you have tissue flap surgery, you may lose strength in the part of your body where the flap came from. You may get swelling in your arm (lymphedema). Will I need more surgery?  Yes. You will need surgery at least 2 more times to build a new breast-like shape. With implants, you may need more surgery months or years later. You may also need surgery to remove lymph nodes from under your arm. What other types of treatment will I need?  You may need chemotherapy, hormone therapy, or radiation therapy. Some women get all 3 types of therapy or any combination of the 3. Will insurance pay for my surgery?  Check with your insurance  company to find out if it pays for breast reconstruction surgery. You should also ask if your insurance will pay for problems that may result from breast reconstruction surgery. Will the type of surgery I have affect how long I live?  Women with early-stage breast cancer who have a mastectomy live the same amount of time as women who have breast-sparing surgery followed by radiation therapy. Most women with breast cancer will lead long, healthy lives after treatment. What are the chances that my cancer will come back after surgery?  About 5% of women who have a mastectomy will get cancer on the same side of their chest within 12 years. Breast reconstruction surgery does not affect the chances of your cancer coming back. Where can I learn more about coping with life after cancer? To learn more about life after cancer, you might want to read "Facing Forward: Life After Cancer Treatment." You can get this booklet at [employer] or by calling 1-800-4-CANCER. THINK ABOUT WHAT IS IMPORTANT TO YOU   After you have talked with your surgeon and learned the facts, you may also want to talk with your spouse or partner, family, friends, or other women who have had breast cancer surgery. The more you know about breast cancer, the treatment, and what happens afterward, the more informed you will be and the easier it will be for you to make good decisions that are important to you.  Think about what is important to you. Here are some questions to think  about:  Do I want to get a second opinion?  How important is it to me how my breast looks after cancer surgery?  How important is it to me how my breast feels after cancer surgery?  If I have breast-sparing surgery or more extensive surgery, am I willing to get radiation, hormone and/or chemotherapy?  If I have a mastectomy, do I also want breast reconstruction surgery?  If I have breast reconstruction surgery, do I want it at the same time  as my mastectomy?  What treatment does my insurance cover, and what do I need to pay for?  Who would I like to talk with about my surgery, radiation, hormone, and chemotherapy choices?  What else do I want to know, do, or learn before I make my choice about breast cancer surgery?  After I have learned all I could and have talked with my surgeon, I will make a choice that feels right for me.  A patient who takes the time to be well-informed will feel more comfortable about her decisions regarding surgery, and will feel better about the procedure following the surgery.  Coping and Support:  Be open and willing to talk to your family and friends about your cancer. Family and friends can be your best support group, to help you work through your concerns and worries.  Discussing your thoughts, concerns, and intimacy issues with your spouse or partner is very important and helpful.  Join support groups to share and learn how others with breast cancer cope and deal with their cancer. The [employer] can help you find support groups in your area.  Breast cancer may affect your confidence and feelings about being a total woman. It may interfere with your intimate relationship with your partner. Counseling may be necessary to help you overcome these feelings.  Talk to a counselor, your clergyperson, psychologist, or psychiatrist.  Talk to a medical social worker, if you have financial questions or problems.  Do not be afraid to ask for help, especially during your treatment.  Learn to be as independent as possible, as soon as possible. Document Released: 09/24/2003 Document Revised: 09/26/2011 Document Reviewed: 06/01/2009 [facility] Patient Information 2014 [place], [place].

## 2013-04-08 ENCOUNTER — Encounter (INDEPENDENT_AMBULATORY_CARE_PROVIDER_SITE_OTHER): Payer: Self-pay | Admitting: General Surgery

## 2013-04-08 ENCOUNTER — Ambulatory Visit (INDEPENDENT_AMBULATORY_CARE_PROVIDER_SITE_OTHER): Payer: Medicare Other | Admitting: General Surgery

## 2013-04-08 VITALS — BP 120/68 | HR 68 | Temp 98.4°F | Resp 14 | Ht 62.0 in | Wt 206.2 lb

## 2013-04-08 DIAGNOSIS — D0590 Unspecified type of carcinoma in situ of unspecified breast: Secondary | ICD-10-CM

## 2013-04-08 DIAGNOSIS — C50412 Malignant neoplasm of upper-outer quadrant of left female breast: Secondary | ICD-10-CM

## 2013-04-08 DIAGNOSIS — D0591 Unspecified type of carcinoma in situ of right breast: Secondary | ICD-10-CM | POA: Insufficient documentation

## 2013-04-08 DIAGNOSIS — C50419 Malignant neoplasm of upper-outer quadrant of unspecified female breast: Secondary | ICD-10-CM

## 2013-04-08 NOTE — Patient Instructions (Signed)
We have reviewed all of your biopsy reports from the left breast and the right breast.  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 More advanced on the left, and less advanced on the right.  You have had a consultation with Dr. Drue Second, who advises that you have chemotherapy and radiation therapy to the left breast postop  We have discussed all of your options.  He will be scheduled for Port-A-Cath insertion, left modified radical mastectomy, right partial mastectomy with needle localization, right axillary sentinel lymph node biopsy.  You are not a candidate for immediate reconstruction, but that can be considered in the future if you change your  Mind.     Mastectomy, With or Without Reconstruction Mastectomy (removal of the breast) is a procedure most commonly used to treat cancer (tumor) of the breast. Different procedures are available for treatment. This depends on the stage of the tumor (abnormal growths). Discuss this with your caregiver, surgeon (a specialist for performing operations such as this), or oncologist (someone specialized in the treatment of cancer). With proper information, you can decide which treatment is best for you. Although the sound of the word cancer is frightening to all of [name], the new treatments and medications can be a source of reassurance and comfort. If there are things you are worried about, discuss them with your caregiver. He or she can help comfort you and your family. Some of the different procedures for treating breast cancer are:  Radical (extensive) mastectomy. This is an operation used to remove the entire breast, the muscles under the breast, and all of the glands (lymph nodes) under the arm. With all of the new treatments available for cancer of the breast, this procedure has become less common.  Modified radical mastectomy. This is a similar operation to the radical mastectomy described above. In the modified radical mastectomy, the muscles of the chest  wall are not removed unless one of the lessor muscles is removed. One of the lessor muscles may be removed to allow better removal of the lymph nodes. The axillary lymph nodes are also removed. Rarely, during an axillary node dissection nerves to this area are damaged. Radiation therapy is then often used to the area following this surgery.  A total mastectomy also known as a complete or simple mastectomy. It involves removal of only the breast. The lymph nodes and the muscles are left in place.  In a lumpectomy, the lump is removed from the breast. This is the simplest form of surgical treatment. A sentinel lymph node biopsy may also be done. Additional treatment may be required. RISKS AND COMPLICATIONS The main problems that follow removal of the breast include:  Infection (germs start growing in the wound). This can usually be treated with antibiotics (medications that kill germs).  Lymphedema. This means the arm on the side of the breast that was operated on swells because the lymph (tissue fluid) cannot follow the main channels back into the body. This only occurs when the lymph nodes have had to be removed under the arm.  There may be some areas of numbness to the upper arm and around the incision (cut by the surgeon) in the breast. This happens because of the cutting of or damage to some of the nerves in the area. This is most often unavoidable.  There may be difficulty moving the arm in a full range of motion (moving in all directions) following surgery. This usually improves with time following use and exercise.  Recurrence of breast cancer  may happen with the very best of surgery and follow up treatment. Sometimes small cancer cells that cannot be seen with the naked eye have already spread at the time of surgery. When this happens other treatment is available. This treatment may be radiation, medications or a combination of both. RECONSTRUCTION Reconstruction of the breast may be done  immediately if there is not going to be post-operative radiation. This surgery is done for cosmetic (improve appearance) purposes to improve the physical appearance after the operation. This may be done in two ways:  It can be done using a saline filled prosthetic (an artificial breast which is filled with salt water). Silicone breast implants are now re-approved by the FDA and are being commonly used.  Reconstruction can be done using the body's own muscle/fat/skin. Your caregiver will discuss your options with you. Depending upon your needs or choice, together you will be able to determine which procedure is best for you. Document Released: 03/29/2001 Document Revised: 03/28/2012 Document Reviewed: 11/20/2007 [facility] Patient Information 2014 [place], [place].     Lumpectomy, Breast Conserving Surgery A lumpectomy is breast surgery that removes only part of the breast. Another name used may be partial mastectomy. The amount removed varies. Make sure you understand how much of your breast will be removed. Reasons for a lumpectomy:  Any solid breast mass.  Grouped significant nodularity that may be confused with a solitary breast mass. Lumpectomy is the most common form of breast cancer surgery today. The surgeon removes the portion of your breast which contains the tumor (cancer). This is the lump. Some normal tissue around the lump is also removed to be sure that all the tumor has been removed.  If cancer cells are found in the margins where the breast tissue was removed, your surgeon will do more surgery to remove the remaining cancer tissue. This is called re-excision surgery. Radiation and/or chemotherapy treatments are often given following a lumpectomy to kill any cancer cells that could possibly remain.  REASONS YOU MAY NOT BE ABLE TO HAVE BREAST CONSERVING SURGERY:  The tumor is located in more than one place.  Your breast is small and the tumor is large so the breast would be  disfigured.  The entire tumor removal is not successful with a lumpectomy.  You cannot commit to a full course of chemotherapy, radiation therapy or are pregnant and cannot have radiation.  You have previously had radiation to the breast to treat cancer. HOW A LUMPECTOMY IS PERFORMED If overnight nursing is not required following a biopsy, a lumpectomy can be performed as a same-day surgery. This can be done in a hospital, clinic, or surgical center. The anesthesia used will depend on your surgeon. They will discuss this with you. A general anesthetic keeps you sleeping through the procedure. LET YOUR CAREGIVERS KNOW ABOUT THE FOLLOWING:  Allergies  Medications taken including herbs, eye drops, over the counter medications, and creams.  Use of steroids (by mouth or creams)  Previous problems with anesthetics or Novocaine.  Possibility of pregnancy, if this applies  History of blood clots (thrombophlebitis)  History of bleeding or blood problems.  Previous surgery  Other health problems BEFORE THE PROCEDURE You should be present one hour prior to your procedure unless directed otherwise.  AFTER THE PROCEDURE  After surgery, you will be taken to the recovery area where a nurse will watch and check your progress. Once you're awake, stable, and taking fluids well, barring other problems you will be allowed to go  home.  Ice packs applied to your operative site may help with discomfort and keep the swelling down.  A small rubber drain may be placed in the breast for a couple of days to prevent a hematoma from developing in the breast.  A pressure dressing may be applied for 24 to 48 hours to prevent bleeding.  Keep the wound dry.  You may resume a normal diet and activities as directed. Avoid strenuous activities affecting the arm on the side of the biopsy site such as tennis, swimming, heavy lifting (more than 10 pounds) or pulling.  Bruising in the breast is normal following  this procedure.  Wearing a bra - even to bed - may be more comfortable and also help keep the dressing on.  Change dressings as directed.  Only take over-the-counter or prescription medicines for pain, discomfort, or fever as directed by your caregiver. Call for your results as instructed by your surgeon. Remember it is your responsibility to get the results of your lumpectomy if your surgeon asked you to follow-up. Do not assume everything is fine if you have not heard from your caregiver. SEEK MEDICAL CARE IF:   There is increased bleeding (more than a small spot) from the wound.  You notice redness, swelling, or increasing pain in the wound.  Pus is coming from wound.  An unexplained oral temperature above 102 F (38.9 C) develops.  You notice a foul smell coming from the wound or dressing. SEEK IMMEDIATE MEDICAL CARE IF:   You develop a rash.  You have difficulty breathing.  You have any allergic problems. Document Released: 08/15/2006 Document Revised: 09/26/2011 Document Reviewed: 11/16/2006 [facility] Patient Information 2014 [place], [place].

## 2013-04-08 NOTE — Progress Notes (Signed)
Patient ID: Kathy Maxwell, female   DOB: 1955-09-26, 57 y.o.   MRN: [id number]  Chief Complaint  Patient presents with  . Follow-up    discuss br ca sx    HPI Kathy Maxwell is a 57 y.o. female.  She returns for further discussion following MRI guided biopsy of the left breast and the right breast, and following consultation with Dr. Drue Second.   Summary of her initial consultation with me is as follows: Kathy Maxwell is a 57 y.o. female. She is referred by Dr. Micheline Maze at the breast center [facility] for evaluation of a newly diagnosed invasive cancer of the left breast with positive lymph node metastasis. Dr. Dorothyann Peng is her primary care physician.  This patient has never had any breast problems before and did not perceive a mass. She went for screening mammograms which led to diagnostic mammograms and ultrasound of the left side. The mammograms & ultrasound showed 2.9 x 1.6 x 1.9 cm suspicious mass in the left breast, 12:30 position, 6 cm from the nipple. There was also a suspicious left axillary lymph nodes. Her breasts are quite dense, which I suspect will make mammographic followup problematic. Image guided biopsy of the left breast mass and the lymph node showed invasive mammary carcinoma, probably ductal phenotype. ER 100%, PR 98%, Ki 67 13%, HER-2 negative.  MRI was performed today, but has not been read yet. I will get a reading later today. The patient is aware of her diagnosis.  Family history is positive for breast cancer in a maternal aunt who had a mastectomy at age 35. No other breast or ovarian cancer.  The patient has significant morbidities with bipolar disorder, hypertension, obesity. She's had TAH and BSO. She's had 2 pregnancies, 2 abortions. She is scheduled for cataract surgery in [place] next week. She states she is on Medicare because of her bipolar disorder.   Subsequent MRI showed a larger area on the  Left side, 7.4 cm in size. Biopsy of the most anterior area  showed invasive and noninvasive breast cancer.  MRI also showed an enhancing focus centrally in the right breast, 3.3 cm. Biopsy of this area showed grade 2, ductal carcinoma in situ. Breast diagnostic profile is pending.  Following lengthy discussions with Dr. Drue Second and with me, she has decided to go ahead with modified radical mastectomy on the left, and partial mastectomy and sentinel node biopsy of the right, and Port-A-Cath insertion. She agrees with chemotherapy. She knows that mastectomy on the right is also an option.  We had a very long, 30 minute discussion about this and she is comfortable with this plan. She knows that reconstruction on the left is an option, but probably should not be done immediately because of the radiation therapy. At this point she states that she is not mentioned Reconstruction.   Dr. Drue Second plans are for all for genetic counseling and genetic testing.  She knows that if she chooses lumpectomy on the right that she will have to have radiation therapy and she is comfortable without that as well. HPI  Past Medical History  Diagnosis Date  . Hyperlipidemia   . Hypertension   . Diabetes mellitus without complication   . Bipolar 1 disorder   . Cancer   . Hypertension 04/04/2013  . Hyperlipidemia 04/04/2013  . Bipolar 1 disorder 04/04/2013    Past Surgical History  Procedure Laterality Date  . Abdominal hysterectomy    . Cholecystectomy  Family History  Problem Relation Age of Onset  . Heart disease Mother   . Cancer Maternal Aunt 69    breast cancer  . Cancer Cousin 12    breast cancer    Social History History  Substance Use Topics  . Smoking status: Former Smoker    Quit date: 07/19/1987  . Smokeless tobacco: Never Used  . Alcohol Use: No    Allergies  Allergen Reactions  . Latex     Current Outpatient Prescriptions  Medication Sig Dispense Refill  . amLODipine (NORVASC) 10 MG tablet Take 10 mg by mouth daily.      .  Azilsartan-Chlorthalidone 40-25 MG TABS Take by mouth.      [name] buPROPion (WELLBUTRIN SR) 150 MG 12 hr tablet Take 150 mg by mouth 2 (two) times daily.      . hydrALAZINE (APRESOLINE) 25 MG tablet Take 25 mg by mouth 2 (two) times daily.      Demetra [employer] (EASY TOUCH LANCING DEVICE) MISC       . Lite Touch Lancets MISC       . losartan (COZAAR) 100 MG tablet       . metoprolol tartrate (LOPRESSOR) 25 MG tablet Take 25 mg by mouth 2 (two) times daily.      . ONE TOUCH ULTRA TEST test strip       . pantoprazole (PROTONIX) 40 MG tablet Take 40 mg by mouth daily.      . pioglitazone-metformin (ACTOPLUS MET) 15-850 MG per tablet Take 1 tablet by mouth 2 (two) times daily with a meal.      . rosuvastatin (CRESTOR) 10 MG tablet Take 5 mg by mouth daily.       . sitaGLIPtin (JANUVIA) 100 MG tablet Take 100 mg by mouth daily.       No current facility-administered medications for this visit.    Review of Systems Review of Systems  Constitutional: Negative for fever, chills and unexpected weight change.  HENT: Negative for hearing loss, congestion, sore throat, trouble swallowing and voice change.   Eyes: Negative for visual disturbance.  Respiratory: Negative for cough and wheezing.   Cardiovascular: Negative for chest pain, palpitations and leg swelling.  Gastrointestinal: Negative for nausea, vomiting, abdominal pain, diarrhea, constipation, blood in stool, abdominal distention and anal bleeding.  Genitourinary: Negative for hematuria, vaginal bleeding and difficulty urinating.  Musculoskeletal: Negative for arthralgias.  Skin: Negative for rash and wound.  Neurological: Negative for seizures, syncope and headaches.  Hematological: Negative for adenopathy. Does not bruise/bleed easily.  Psychiatric/Behavioral: Negative for confusion.    Blood pressure 120/68, pulse 68, temperature 98.4 F (36.9 C), temperature source Temporal, resp. rate 14, height 5\' 2"  (1.575 m), weight 206 lb 3.2 oz  (93.532 kg).  Physical Exam Physical Exam  Constitutional: She is oriented to person, place, and time. She appears well-developed and well-nourished. No distress.  HENT:  Head: Normocephalic and atraumatic.  Nose: Nose normal.  Mouth/Throat: No oropharyngeal exudate.  Eyes: Conjunctivae and EOM are normal. Pupils are equal, round, and reactive to light. Left eye exhibits no discharge. No scleral icterus.  Neck: Neck supple. No JVD present. No tracheal deviation present. No thyromegaly present.  Cardiovascular: Normal rate, regular rhythm, normal heart sounds and intact distal pulses.   No murmur heard. Pulmonary/Chest: Effort normal and breath sounds normal. No respiratory distress. She has no wheezes. She has no rales. She exhibits no tenderness.  Breasts are moderately large. The broad size 40 2D according to  patient. Vague density in the left breast at about the 1:00 position. Minor bruising on both sides. No other masses. No axillary adenopathy.  Abdominal: Soft. Bowel sounds are normal. She exhibits no distension and no mass. There is no tenderness. There is no rebound and no guarding.  Lower midline scar  Musculoskeletal: She exhibits no edema and no tenderness.  Lymphadenopathy:    She has no cervical adenopathy.  Neurological: She is alert and oriented to person, place, and time. She exhibits normal muscle tone. Coordination normal.  Skin: Skin is warm. No rash noted. She is not diaphoretic. No erythema. No pallor.  Psychiatric: She has a normal mood and affect. Her behavior is normal. Judgment and thought content normal.  Mild anxiety. Interacts well. Good insight.     Data Reviewed MRI. MRI biopsies. Pathology reports. Dr. Feliz Beam office notes.  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 Clinical stage TIII, N1. ER 100%, PR 90%, Ki 67 13%.  HER-2 negative.  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 This diagnostic profile pending  Bipolar disorder  Hypertension  Obesity  Non-insulin-dependent diabetes mellitus  History of TAH and BSO  History of laparoscopic cholecystectomy  Cataracts.     Plan    Proceed with scheduling of Port-A-Cath insertion, left modified radical mastectomy, right partial mastectomy with needle localization, right axillary sentinel node biopsy.  I discussed the indications, details, techniques, numerous risk of the surgery with her. She's where the risks of bleeding, infection, skin necrosis, or swelling, or numbness, reoperation of the right for positive margins are positive nodes, potential for recurrence. She is aware of the risk of Port-A-Cath complications including pneumothorax, catheter infection, catheter malfunction. At this point in time she understands all these issues and all of her questions are answered. She agrees with this plan.  She has a consultation with Dr.  Hal Neer this Wednesday  Dr. Welton Flakes will refer her for genetic counseling.        [name]. Derrell Lolling, M.D., [facility] General and Minimally invasive Surgery Breast and Colorectal Surgery Office:   [phone] Pager:   [phone]  04/08/2013, 9:53 AM

## 2013-04-09 ENCOUNTER — Encounter: Payer: Self-pay | Admitting: Radiation Oncology

## 2013-04-09 ENCOUNTER — Other Ambulatory Visit: Payer: Self-pay | Admitting: Oncology

## 2013-04-09 ENCOUNTER — Telehealth: Payer: Self-pay | Admitting: Oncology

## 2013-04-09 DIAGNOSIS — C50412 Malignant neoplasm of upper-outer quadrant of left female breast: Secondary | ICD-10-CM

## 2013-04-09 NOTE — Telephone Encounter (Signed)
, °

## 2013-04-09 NOTE — Progress Notes (Signed)
Location of Breast Cancer:Right/Left:    Histology per Pathology Report:04/03/13:1. Breast, left, needle core biopsy, anterior- INVASIVE DUCTAL CARCINOMA.- DUCTAL CARCINOMA IN SITU.- SEE COMMENT. 2. Breast, right, needle core biopsy, central - DUCTAL CARCINOMA IN SITU. 03/07/13:1. Breast, left, needle core biopsy, mass, 12:30 o'clock- INVASIVE MAMMARY CARCINOMA, SEE COMMENT2. Lymph node, needle/core biopsy, borderline left axillary- ONE LYMPH NODE POSITIVE FOR METASTATIC MAMMARY CARCINOMA (1/1), SEE COMMENT.Receptor Status: ER(+), PR (+), Her2-neu (-)  Did patient present with symptoms (if so, please note symptoms) or was this found on screening mammography?:screening Past/Anticipated interventions by surgeon, if any: surgery  04/22/13 Dr. Derrell Lolling  Past/Anticipated interventions by medical oncology, if any: Chemotherapy : per Dr.Khan decided to have  Modified radical mastectomy on the left breast, and partial mastectomy on the right breast, / referral for genetic counseling and testing ,also for port a cath , chemotherapy education 05/06/13, follow up 05/10/13 with Dr.Khan  Lymphedema issues, if any:   Pain issues, if any:  SAFETY ISSUES:  Prior radiation? no  Pacemaker/ICD? no  Possible current pregnancy?no  Is the patient on methotrexate? no  Current Complaints / other details:  Single, disabled,  GX4,P2, (2 abortions), Bipolar, TAH& BSO, ,HTN,NIDDM, OBesity,   Maternal aunt breast cancer,  Age 38 with mastectomy,  Mother heart disease, cousin  Breast cancer age 81, ,former smoker,quit 1989,no alcohol use,  Lowella Petties, RN 04/09/2013,11:09 AM  Patient here with god daughter.Tentatively scheduled for surgery on 04/22/13.Denies pain or any other problems.

## 2013-04-10 ENCOUNTER — Encounter: Payer: Self-pay | Admitting: Radiation Oncology

## 2013-04-10 ENCOUNTER — Ambulatory Visit
Admission: RE | Admit: 2013-04-10 | Discharge: 2013-04-10 | Disposition: A | Discharge status: Home / Self Care | Payer: Medicare Other | Source: Ambulatory Visit | Attending: Radiation Oncology | Admitting: Radiation Oncology

## 2013-04-10 VITALS — BP 124/48 | HR 74 | Temp 97.8°F | Wt 207.5 lb

## 2013-04-10 DIAGNOSIS — Z79899 Other long term (current) drug therapy: Secondary | ICD-10-CM | POA: Insufficient documentation

## 2013-04-10 DIAGNOSIS — I10 Essential (primary) hypertension: Secondary | ICD-10-CM | POA: Insufficient documentation

## 2013-04-10 DIAGNOSIS — D0590 Unspecified type of carcinoma in situ of unspecified breast: Secondary | ICD-10-CM | POA: Insufficient documentation

## 2013-04-10 DIAGNOSIS — E785 Hyperlipidemia, unspecified: Secondary | ICD-10-CM | POA: Insufficient documentation

## 2013-04-10 DIAGNOSIS — C50412 Malignant neoplasm of upper-outer quadrant of left female breast: Secondary | ICD-10-CM

## 2013-04-10 DIAGNOSIS — Z170 Estrogen receptor positive status [ER+]: Secondary | ICD-10-CM | POA: Insufficient documentation

## 2013-04-10 DIAGNOSIS — F319 Bipolar disorder, unspecified: Secondary | ICD-10-CM | POA: Insufficient documentation

## 2013-04-10 DIAGNOSIS — E119 Type 2 diabetes mellitus without complications: Secondary | ICD-10-CM | POA: Insufficient documentation

## 2013-04-10 DIAGNOSIS — C50919 Malignant neoplasm of unspecified site of unspecified female breast: Secondary | ICD-10-CM | POA: Insufficient documentation

## 2013-04-10 HISTORY — DX: Malignant neoplasm of unspecified site of unspecified female breast: C50.919

## 2013-04-10 HISTORY — DX: Allergy, unspecified, initial encounter: T78.40XA

## 2013-04-10 HISTORY — DX: Obesity, unspecified: E66.9

## 2013-04-10 NOTE — Progress Notes (Signed)
Radiation Oncology         [phone]) [phone] ________________________________  Initial outpatient Consultation - Date: 04/10/2013   Name: Kathy Maxwell MRN: [id number]   DOB: 1956-03-02  REFERRING PHYSICIAN: Victorino December, MD  DIAGNOSIS: DCIS of the right breast, T3N1 left breast  HISTORY OF PRESENT ILLNESS::Kathy Maxwell is a 57 y.o. female  who underwent a screening mammogram which showed a 3 cm mass in the left breast 6 cm from the nipple as well suspicious left axillary nodes. I the was performed of the invasive cancer which showed an invasive mammary carcinoma which was ER/PR positive HER-2 negative with a Ki-67 of 13%. I lateral breast MRI was performed which showed a 7.4 cm area of cancer as well as enlarged axillary lymph nodes. The right breast revealed a 3.3 cm mass. She had a biopsy of the more anterior aspect of this mass in the left breast which showed invasive ductal carcinoma. The biopsy of the right breast revealed DCIS. He has discussed surgical options with Dr. Derrell Lolling had and has decided on a lumpectomy on the right and a left mastectomy and axillary lymph node dissection on the left. She has been referred to me for consideration of radiation in the management of her newly diagnosed breast cancer. She has very little pain. As she is accompanied by her daughter. He lives in [place] but is staying with her daughter here in [place] during her treatment. Her surgery is scheduled for October 6.   PREVIOUS RADIATION THERAPY: No  PAST MEDICAL HISTORY:  has a past medical history of Hyperlipidemia; Hypertension; Bipolar 1 disorder; Cancer; Hypertension (04/04/2013); Hyperlipidemia (04/04/2013); Bipolar 1 disorder (04/04/2013); Breast cancer (03/10/13); Allergy; Obesity; and Diabetes mellitus without complication.    PAST SURGICAL HISTORY: Past Surgical History  Procedure Laterality Date  . Cholecystectomy    . Breast biopsy Left 05/08/09    fibroadenoma with microcalcifications,no  malignancy id  . Breast biopsy Left 03/07/13    invasive ca,1 lymph node metastatic  . 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 Abdominal hysterectomy    . Coronary stent placement  2001    FAMILY HISTORY:  Family History  Problem Relation Age of Onset  . Heart disease Mother   . Cancer Maternal Aunt 69    breast cancer  . Cancer Cousin 27    breast cancer    SOCIAL HISTORY:  History  Substance Use Topics  . Smoking status: Former Smoker    Quit date: 07/19/1987  . Smokeless tobacco: Never Used  . Alcohol Use: No    ALLERGIES: Latex  MEDICATIONS:  Current Outpatient Prescriptions  Medication Sig Dispense Refill  . amLODipine (NORVASC) 10 MG tablet Take 10 mg by mouth daily.      . Azilsartan-Chlorthalidone 40-25 MG TABS Take by mouth.      [name] buPROPion (WELLBUTRIN SR) 150 MG 12 hr tablet Take 150 mg by mouth 2 (two) times daily.      . hydrALAZINE (APRESOLINE) 25 MG tablet Take 25 mg by mouth 2 (two) times daily.      Demetra Shiner [employer] (EASY TOUCH LANCING DEVICE) MISC       . Lite Touch Lancets MISC       . losartan (COZAAR) 100 MG tablet       . metoprolol tartrate (LOPRESSOR) 25 MG tablet Take 25 mg by mouth 2 (two) times daily.      . ONE TOUCH ULTRA TEST test strip       .  pantoprazole (PROTONIX) 40 MG tablet Take 40 mg by mouth daily.      . pioglitazone-metformin (ACTOPLUS MET) 15-850 MG per tablet Take 1 tablet by mouth 2 (two) times daily with a meal.      . rosuvastatin (CRESTOR) 10 MG tablet Take 5 mg by mouth daily.       . sitaGLIPtin (JANUVIA) 100 MG tablet Take 100 mg by mouth daily.       No current facility-administered medications for this encounter.    REVIEW OF SYSTEMS:  A 15 point review of systems is documented in the electronic medical record. This was obtained by the nursing staff. However, I reviewed this with the patient to discuss relevant findings and make appropriate changes.  Pertinent items  are noted in HPI.  PHYSICAL EXAM:  Filed Vitals:   04/10/13 1530  BP: 124/48  Pulse: 74  Temp: 97.8 F (36.6 C)  .207 lb 8 oz (94.121 kg). ECoG performance status 0. A palpable mass is noted in the upper outer quadrant of the left breast. No palpable abnormalities the right breast. No palpable axillary adenopathy bilaterally. No palpable cervical or supraclavicular adenopathy. Alert and oriented x3.  LABORATORY DATA:  Lab Results  Component Value Date   WBC 5.9 04/04/2013   HGB 11.3* 04/04/2013   HCT 34.0* 04/04/2013   MCV 90.8 04/04/2013   PLT 280 04/04/2013   Lab Results  Component Value Date   NA 140 04/04/2013   K 4.6 04/04/2013   CO2 29 04/04/2013   Lab Results  Component Value Date   ALT 11 04/04/2013   AST 15 04/04/2013   ALKPHOS 93 04/04/2013   BILITOT 0.33 04/04/2013     RADIOGRAPHY: US Breast Bilateral  03/28/2013   *RADIOLOGY REPORT*  Clinical Data:  Patient with recent diagnosis of left breast cancer status post recent bilateral breast MRI.  The patient presents for evaluation of the left breast to help define the extent of disease as well as for second look ultrasound of the right breast to attempt to locate/biopsy the area of non mass enhancement within the right breast on prior MRI.  Additionally there were questioned lymph nodes within the right axilla.  BILATERAL BREAST ULTRASOUND  Comparison:  Breast MRI 03/15/2013  On physical exam, no definite discrete masses are able to be palpated within either breast.  Findings:  Ultrasound is performed of the left breast and the previously identified/biopsied irregular shadowing mass was able to be identified.  The patient has dense parenchymal tissue with areas of posterior acoustic scattering scattered throughout the breast making visualization of any discrete additional masses identified on prior MRI difficult.  Ultrasound evaluation of the right breast did not reveal any correlate for the described non mass enhancement centrally.   Evaluation of the right axilla demonstrated a few lymph nodes without significantly thickened cortices.  IMPRESSION: 1. Previously biopsied mass within the left breast is able to be identified with ultrasound. Additional areas identified on the recent MRI are not able to be discretely identified likely secondary to diffuse shadowing parenchymal tissue throughout the left breast.  If true evaluation of the extent of disease is desired for possible breast conservation therapy, the most accurate assessment would be MRI guided biopsy of the anterior most aspect of the abnormal enhancement within the left breast.  2.  No definite ultrasound correlate was identified within the right breast to correspond with non mass enhancement identified on recent prior MRI.  Recommend right breast MRI guided biopsy for  further evaluation.  RECOMMENDATION: Bilateral MRI guided core needle biopsies.  These have been scheduled for 04/03/2013 at 7:15 a.m.  I have discussed the findings and recommendations with the patient. Results were also provided in writing at the conclusion of the visit.  If applicable, a reminder letter will be sent to the patient regarding the next appointment.  BI-RADS CATEGORY 4:  Suspicious abnormality - biopsy should be considered.   Original Report Authenticated By: Annia Belt, M.D   Mr Breast Bilateral W Wo Contrast  03/15/2013   **ADDENDUM** CREATED: 03/15/2013 14:02:33  I discussed this case with Dr. Derrell Lolling over the phone.  In light of the fact that the patient has bipolar disorder, Dr. Derrell Lolling would like [name] to discuss the options of mastectomy versus lumpectomy of the left breast with the patient returns for second look ultrasound and possible biopsy.   Addended by:  Sherian Rein, M.D. on 03/15/2013 14:02:33.  **END ADDENDUM** SIGNED BY: Sherian Rein, M.D.  03/15/2013   *RADIOLOGY REPORT*  Clinical Data:  Recent diagnosis of left breast cancer.  The patient has had prior benign biopsies of both breasts.   The patient has a bipolar disorder.  MRI BILATERAL BREAST WITHOUT AND WITH CONTRAST  Technique: Multiplanar, multisequence MR images of the right breast were obtained prior to and following the intravenous administration of 14ml of multi hance.  Labs were obtained at [facility].  BUN is 18. Creatinine is 1.0  Comparison:  Recent imaging examinations.  FINDINGS:  Breast composition:  c:  Heterogeneous fibroglandular tissue  Background parenchymal enhancement:  Mild  Left breast:  There are at least four focus of abnormal spiculated enhancement demonstrating washout kinetics in the upper left breast centrally.   The entirety area measures 7.4 cm transverse x 6.6 cm anterior-posterior x 5.1 cm cranial-caudal. The recently biopsy focus is posteriorly located measuring 2.1 x 1 x 2.1 cm.  The anterior focus that was not recently biopsies measures 3.1 x 2.6 x 2.6 cm.  There is a lateral focus not recently biopsied measuring 1.1 x 1.4 x 1.6 cm.  There is a small medial focus measuring cysts 0.7 x 0.8 x 0.7 cm.  Post biopsy changes are identified in the left axilla.  There are abnormal enlarged lymph nodes correlating to the patient's known metastatic neoplasm to the lymph nodes with the largest lymph node measuring 1.8 x 0.8 cm.  Right breast:   There is an irregular focus of 3.3 x 3.1 x 1.3 cm abnormal enhancement in the central right breast in the middle one third demonstrating washout kinetics.  There are mild thickened cortex and prominent lymph nodes within the right axilla, largest measures 1.6 x 1 cm.  There is a posterior central upper breast focus with associated clip artifact measuring 1.2 x 2.2 x 1 cm previously biopsied to be benign.  IMPRESSION:  Impression: Suspicious findings in bilateral breasts.  1. Several abnormal enhancing foci encompassing 7.4 x 6.6 x 5.1 cm area of the upper left breast. The posterior focus was most recently biopsy to be cancer.   2. Abnormal enhancing focus in the  central middle one third of right breast and mild prominent right axillary lymph nodes.  RECOMMENDATION: 1. If the patient is interested left breast conservation, then a second look ultrasound with biopsy of the most anterior focus is recommended to assess the extent of disease in the left breast.  2.  Recommend a second look ultrasound with biopsy of the right breast  mass and right axillary lymph node.  If the area within the right breast cannot be found on ultrasound, an MR guided core biopsy is recommended.  BI-RADS CATEGORY 5:  Highly suggestive of malignancy - appropriate action should be taken.  THREE-DIMENSIONAL MR IMAGE RENDERING ON INDEPENDENT WORKSTATION:  Three-dimensional MR images were rendered by post-processing of the original MR data on a DynaCad workstation.  The three-dimensional MR images were interpreted, and findings were reported in the accompanying complete MRI report for this study.   Original Report Authenticated By: Sherian Rein, M.D.   Mm Digital Diagnostic Bilat  04/03/2013   *RADIOLOGY REPORT*  Clinical Data:  Known left breast cancer.  Abnormal enhancement seen in both breast with MR imaging.  DIGITAL DIAGNOSTIC BILATERAL MAMMOGRAM  Comparison:  Previous exams.  Findings:  Mammographic images were obtained following MR guided core biopsies of both breast. A cylindrical shaped clip is seen in the central portion of the right breast.  A cylindrical clip is seen in the anterior aspect of the left breast.  The clip is located 6.4 cm anterior inferior to the ribbon shaped clip.  IMPRESSION: Status post MR guided core biopsies of both breast with pathology pending.  Final Assessment:  Post Procedure Mammograms for Marker Placement   Original Report Authenticated By: Baird Lyons, M.D.   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  This is  concordant.  The patient has an appointment with oncology today and with Dr. Derrell Lolling on 04/08/13.  The patient and I discussed these results and appointments by phone today at 11:00.  She stated she was doing fine after the biopsy.  **END ADDENDUM** SIGNED BY: Esperanza Heir, M.D.  04/03/2013   *RADIOLOGY REPORT*  Clinical Data:  Known left breast cancer.  Abnormal MRI.  Evaluate extent of disease.  MRI GUIDED VACUUM ASSISTED BIOPSY OF THE LEFT BREAST WITHOUT AND WITH CONTRAST  Comparison: Previous exams.  Technique: Multiplanar, multisequence MR images of both breast were obtained prior to and following the intravenous administration of 19 ml of Mulithance.  I met with the patient, and we discussed the procedure of MRI guided biopsy, including risks, benefits, and alternatives. Specifically, we discussed the risks of infection, bleeding, tissue injury, clip migration, and inadequate sampling.  Informed, written consent was given.  Using sterile technique, 2% Lidocaine, MRI guidance, and a 9 gauge vacuum assisted device, biopsy was performed of abnormal enhancement in the anterior aspect of the left breast using a lung approach.  At the conclusion of the procedure, a cylindrical show tissue marker clip was deployed into the biopsy cavity.  IMPRESSION: MRI guided biopsy of the left breast. No apparent complications.  THREE-DIMENSIONAL MR IMAGE RENDERING ON INDEPENDENT WORKSTATION:  Three-dimensional MR images were rendered by post-processing of the original MR data on a DynaCad workstation.  The three-dimensional MR images were interpreted, and findings were reported in the accompanying complete MRI report for this study.   Original Report Authenticated By: Baird Lyons, M.D.   Mr Rt Breast Bx Jones Bales Dev 1st Lesion Image Bx Spec Mr Guide  04/03/2013   *RADIOLOGY REPORT*  Clinical Data:  Known left breast cancer.  Abnormal enhancement seen in the right breast.  MRI GUIDED VACUUM ASSISTED BIOPSY OF THE RIGHT BREAST  WITHOUT AND WITH CONTRAST  Comparison: Previous exams.  Technique: Multiplanar, multisequence MR images of  both breast were obtained prior to and following the intravenous administration of 19 ml of Mulithance.  I met with the patient, and we discussed the procedure of MRI guided biopsy, including risks, benefits, and alternatives. Specifically, we discussed the risks of infection, bleeding, tissue injury, clip migration, and inadequate sampling.  Informed, written consent was given.  Using sterile technique, 2% Lidocaine, MRI guidance, and a 9 gauge vacuum assisted device, biopsy was performed of abnormal enhancement in the central portion of the right breast using a lateral approach.  At the conclusion of the procedure, a cylindrical shaped tissue marker clip was deployed into the biopsy cavity.  IMPRESSION: MRI guided biopsy of the right breast. No apparent complications.  THREE-DIMENSIONAL MR IMAGE RENDERING ON INDEPENDENT WORKSTATION:  Three-dimensional MR images were rendered by post-processing of the original MR data on a DynaCad workstation.  The three-dimensional MR images were interpreted, and findings were reported in the accompanying complete MRI report for this study.   Original Report Authenticated By: Baird Lyons, M.D.      IMPRESSION: T3N1 left breast cancer and DCIS of the right breast  PLAN: I discussed the role of radiation in decreasing local years in patients who undergo breast conservation. We discussed 33 treatments as an outpatient right breast after a back to me for DCIS. We discussed the results of randomized trial showing a survival and a local control benefit to patients who undergo postmastectomy radiation. I have recommended radiation to the chest wall supraclavicular and axillary lymph nodes on the left pending her pathology. Discussed the possible side effects of treatment including fatigue and skin irritation as well as skin darkness. We discussed her increased complication seen  in patients who undergo construction after receiving radiation. We discussed that radiation usually start about a month after chemotherapy is complete the cancer Center she would like to move the process along more quickly. I spent 40 minutes  face to face with the patient and more than 50% of that time was spent in counseling and/or coordination of care.   ------------------------------------------------  Lurline Hare, MD

## 2013-04-10 NOTE — Progress Notes (Signed)
Please see the Nurse Progress Note in the MD Initial Consult Encounter for this patient. 

## 2013-04-12 ENCOUNTER — Telehealth: Payer: Self-pay | Admitting: Oncology

## 2013-04-12 NOTE — Addendum Note (Signed)
Encounter addended by: Daleen Steinhaus Mintz Kraven Calk, RN on: 04/12/2013  9:17 AM<BR>     Documentation filed: Charges VN

## 2013-04-12 NOTE — Telephone Encounter (Signed)
, °

## 2013-04-12 NOTE — Addendum Note (Signed)
Encounter addended by: Delynn Flavin, RN on: 04/12/2013  9:16 AM<BR>     Documentation filed: Charges VN

## 2013-04-15 ENCOUNTER — Encounter (HOSPITAL_COMMUNITY): Payer: Self-pay | Admitting: Pharmacy Technician

## 2013-04-17 ENCOUNTER — Other Ambulatory Visit (HOSPITAL_COMMUNITY): Payer: Self-pay | Admitting: *Deleted

## 2013-04-17 ENCOUNTER — Encounter (HOSPITAL_COMMUNITY): Payer: Self-pay

## 2013-04-17 ENCOUNTER — Ambulatory Visit (HOSPITAL_COMMUNITY)
Admission: RE | Admit: 2013-04-17 | Discharge: 2013-04-17 | Disposition: A | Discharge status: Home / Self Care | Payer: Medicare Other | Source: Ambulatory Visit | Attending: General Surgery | Admitting: General Surgery

## 2013-04-17 ENCOUNTER — Ambulatory Visit (HOSPITAL_COMMUNITY)
Admission: RE | Admit: 2013-04-17 | Discharge: 2013-04-17 | Disposition: A | Discharge status: Home / Self Care | Payer: Medicare Other | Source: Ambulatory Visit | Attending: Oncology | Admitting: Oncology

## 2013-04-17 ENCOUNTER — Encounter (HOSPITAL_COMMUNITY)
Admission: RE | Admit: 2013-04-17 | Discharge: 2013-04-17 | Disposition: A | Discharge status: Home / Self Care | Payer: Medicare Other | Source: Ambulatory Visit | Attending: General Surgery | Admitting: General Surgery

## 2013-04-17 DIAGNOSIS — C50419 Malignant neoplasm of upper-outer quadrant of unspecified female breast: Secondary | ICD-10-CM | POA: Insufficient documentation

## 2013-04-17 DIAGNOSIS — C50412 Malignant neoplasm of upper-outer quadrant of left female breast: Secondary | ICD-10-CM

## 2013-04-17 DIAGNOSIS — Z01810 Encounter for preprocedural cardiovascular examination: Secondary | ICD-10-CM | POA: Insufficient documentation

## 2013-04-17 DIAGNOSIS — Z01818 Encounter for other preprocedural examination: Secondary | ICD-10-CM | POA: Insufficient documentation

## 2013-04-17 DIAGNOSIS — Z9861 Coronary angioplasty status: Secondary | ICD-10-CM | POA: Insufficient documentation

## 2013-04-17 DIAGNOSIS — Z01812 Encounter for preprocedural laboratory examination: Secondary | ICD-10-CM | POA: Insufficient documentation

## 2013-04-17 DIAGNOSIS — I2510 Atherosclerotic heart disease of native coronary artery without angina pectoris: Secondary | ICD-10-CM | POA: Insufficient documentation

## 2013-04-17 DIAGNOSIS — I517 Cardiomegaly: Secondary | ICD-10-CM | POA: Insufficient documentation

## 2013-04-17 DIAGNOSIS — I700 Atherosclerosis of aorta: Secondary | ICD-10-CM | POA: Insufficient documentation

## 2013-04-17 HISTORY — DX: Gastro-esophageal reflux disease without esophagitis: K21.9

## 2013-04-17 HISTORY — DX: Sleep apnea, unspecified: G47.30

## 2013-04-17 HISTORY — DX: Cardiac murmur, unspecified: R01.1

## 2013-04-17 HISTORY — DX: Atherosclerotic heart disease of native coronary artery without angina pectoris: I25.10

## 2013-04-17 LAB — BASIC METABOLIC PANEL
BUN: 18 mg/dL (ref 6–23)
Calcium: 9.3 mg/dL (ref 8.4–10.5)
Creatinine, Ser: 0.95 mg/dL (ref 0.50–1.10)
GFR calc Af Amer: 75 mL/min — ABNORMAL LOW (ref >90)
GFR calc non Af Amer: 64 mL/min — ABNORMAL LOW (ref >90)
Glucose, Bld: 110 mg/dL — ABNORMAL HIGH (ref 70–99)
Potassium: 3.6 mEq/L (ref 3.5–5.1)

## 2013-04-17 LAB — CBC
HCT: 33.6 % — ABNORMAL LOW (ref 36.0–46.0)
Hemoglobin: 11.4 g/dL — ABNORMAL LOW (ref 12.0–15.0)
MCH: 30.5 pg (ref 26.0–34.0)
MCHC: 33.9 g/dL (ref 30.0–36.0)
MCV: 89.8 fL (ref 78.0–100.0)
RDW: 13.6 % (ref 11.5–15.5)

## 2013-04-17 MED ORDER — IOHEXOL 300 MG/ML  SOLN
80.0000 mL | Freq: Once | INTRAMUSCULAR | Status: AC | PRN
  Administered 2013-04-17: 80 mL via INTRAVENOUS

## 2013-04-17 NOTE — Progress Notes (Signed)
[facility] Psychosocial Distress Screening Clinical Social Work  Clinical Social Work Intern was referred by distress screening protocol.  The patient scored a 7 on the Psychosocial Distress Thermometer which indicates severe distress. Clinical Social Worker Intern telephoned to assess for distress and other psychosocial needs. Patient answered phone but was unable to talk at that time.  Clinical Social Worker Intern shared telephone number and Patient indicated she would call back at a convenient time.   Clinical Social Worker follow up needed: no  If yes, follow up plan:   Aishia Barkey S. [facility] Clinical Social Work Intern [facility] [facility] [phone]

## 2013-04-17 NOTE — Pre-Procedure Instructions (Addendum)
Kathy Maxwell  04/17/2013   Your procedure is scheduled on: 04/22/13  Report to [facility] short stay admitting at1000 AM.  Call this number if you have problems the morning of surgery: [phone]   Remember:   Do not eat food or drink liquids after midnight.   Take these medicines the morning of surgery with A SIP OF WATER: amlodipine, wellbutrin, hydralazine, metoprolol, protonix   Do not wear jewelry, make-up or nail polish.  Do not wear lotions, powders, or perfumes. You may wear deodorant.  Do not shave 48 hours prior to surgery. Men may shave face and neck.  Do not bring valuables to the hospital.  [facility] is not responsible                  for any belongings or valuables.               Contacts, dentures or bridgework may not be worn into surgery.  Leave suitcase in the car. After surgery it may be brought to your room.  For patients admitted to the hospital, discharge time is determined by your                treatment team.               Patients discharged the day of surgery will not be allowed to drive  home.  Name and phone number of your driver:   Special Instructions: Shower using CHG 2 nights before surgery and the night before surgery.  If you shower the day of surgery use CHG.  Use special wash - you have one bottle of CHG for all showers.  You should use approximately 1/3 of the bottle for each shower.   Please read over the following fact sheets that you were given: Pain Booklet, Coughing and Deep Breathing and Surgical Site Infection Prevention

## 2013-04-17 NOTE — Progress Notes (Addendum)
Sleep study req'd done last yr ? [place] test pos but does not have cpap due to insurance.  req'd notes ,any test from dr Alanda Amass( unsure of last visit -long time),to have echo done at [facility] long tomorrow 04/18/13

## 2013-04-17 NOTE — Progress Notes (Addendum)
Anesthesia Chart Review:  Patient is a 57 year old female scheduled for insertion of Port-a-cath, left modified radical mastectomy, right partial mastectomy with needle localization, right axillary SN biopsy on 04/22/13 by Dr. Derrell Lolling.    History includes bilateral breast cancer, obesity, former smoker, HLD, HTN, Bipolar 1 disorder, DM2, GERD, OSA, murmur, CAD s/p Cypher distal LAD stent '03. PCP is Dr. Dorothyann Peng with [facility]).  She was previously followed by cardiologist Dr. Alanda Amass, but not in several years.  EKG on 04/17/13 showed SR with first degree AVB, septal infarct (age undetermined).  She has multiple EKGs in [place] dating back to 2003.  Rightward axis and small inferior Q waves have resolved.  R wave progression in precordial leads appears similar.  Echocardiogram on 04/18/13 (pre-chemo) showed:  - Left ventricle: Global longitudinal strain is normal 18%. The cavity size was normal. Wall thickness was increased in a pattern of moderate LVH. Systolic function was normal. The estimated ejection fraction was in the range of 55% to 60%. Wall motion was normal; there were no regional wall motion abnormalities. Doppler parameters are consistent with abnormal left ventricular relaxation (grade 1 diastolic dysfunction). - Aortic valve: There was mild stenosis. - Mitral valve: Calcified annulus. - Atrial septum: No defect or patent foramen ovale was identified.  Nuclear stress test on 10/20/2009 [facility] in [place]) showed no scintigraphic evidence of pharmacologically induced myocardial ischemia. No scintigraphic evidence of myocardial infarction. Normal wall motion and preserved EF of 70%.  Cardiac cath on 03/10/05 showed: Normal left main. LAD had irregularities in the proximal third. There was approximately 30% or less narrowing after the first diagonal branch and after the first septal perforator. The previously placed distal LAD stent at the junction of the distal third  had less than 20% renarrowing, with good residual lumen, good flow, and no significant stenosis. There were irregularities of the mid- and distal LAD, with no significant stenosis. The circumflex artery was nondominant. The first marginal was small and normal. The second marginal had a 70% ostial lesion and was small. The third marginal bifurcated twice and was widely patent. The PABG branch trifurcated and had no significant stenosis.  The right coronary was a dominant vessel. There was 40% segmental smooth narrowing in the mid portion beyond the trifurcating RV branch. The distal RCA had no significant stenosis of the PDA or bifurcating PLA, and the AV node branch came off the PLA. LVH. EF > 55%. No MR.  CXR on 04/17/13 showed no active cardiopulmonary disease.  Preoperative labs noted.  I was not asked to evaluate patient during her PAT visit.  I did call and speak with her.  She had positional chest "throbbing" which lasted about 45 minutes on 04/17/2013.  It hurt when she took a deep breath or turned to the side.  She said it went away within about 45 minutes once she started moving around.  She had no associated diaphoresis, nausea, palpitations, jaw or arm pain.  She had similar episode about two years ago after a syncopal/pre-syncopal spell associated with hypoglycemia.  She denies SOB at rest.  She does have DOE with activities such as walking up a hill.  She is able to do house cleaning, but is not overly active.  Her chest pain was positional which would seem more likely musculoskeletal; however, with her known CAD history I did advise her to seek medical attention if she develops recurrent symptoms.  I reviewed above with anesthesiologist Dr. Chaney Malling.  With her CAD history, he would like for me to get additional input from her PCP.  I called [facility] this afternoon.  Patient is primarily followed by Malachy Chamber, NP who will not be until 04/19/13. I will follow-up with her then.    Velna Ochs [facility] Short Stay Center/Anesthesiology Phone [phone] 04/18/2013 4:50 PM  Addendum: 04/18/2013 1:30 PM I have called again this morning to [facility] to speak with Beaulah Corin, but could only leave a message with her nurse.  I called again now, but there is only a voicemail due to lunch.  I am off this afternoon.  I have updated anesthesiologist Dr. Sampson Goon who requested nursing staff follow-up with PCP and update him later today.  I spoke with PAT RN Jan--chart to be given to [facility] for follow-up.  Addendum: 04/22/2013 9:40 AM Progress note from Shireen Quan, RN noted, stating, "At the request of Shonna Chock, PA with Anesthesia I called and spoke to Beaulah Corin, NP at [facility] regarding patient. I notified her of patient's episode on October 1 as noted in [place], [place] note. Ms. Trish Mage stated that patient has previously been worked up for atypical chest pain with an eventual referral to GI and diagnosis of Esophageal Spasms. Ms. Trish Mage stated that she felt like no additional cardiac/ medical work up was necessary prior to surgery. Requested that she fax the GI note and clearance over. Stated she will contact patient today to check on her and if she has any concerns she will contact us."

## 2013-04-17 NOTE — Progress Notes (Signed)
Patient left message for Clinical Social Work Intern to call her.  CSW Intern returned call and it went to voicemail.  CSW Intern left message so that Patient knew call had been returned and made suggestions to call back today or tomorrow.   Eamonn Sermeno S. [facility] Clinical Social Work Intern [employer] [phone]

## 2013-04-18 ENCOUNTER — Ambulatory Visit (HOSPITAL_COMMUNITY)
Admission: RE | Admit: 2013-04-18 | Discharge: 2013-04-18 | Disposition: A | Discharge status: Home / Self Care | Payer: Medicare Other | Source: Ambulatory Visit | Attending: Oncology | Admitting: Oncology

## 2013-04-18 DIAGNOSIS — I080 Rheumatic disorders of both mitral and aortic valves: Secondary | ICD-10-CM | POA: Insufficient documentation

## 2013-04-18 DIAGNOSIS — E119 Type 2 diabetes mellitus without complications: Secondary | ICD-10-CM | POA: Insufficient documentation

## 2013-04-18 DIAGNOSIS — I517 Cardiomegaly: Secondary | ICD-10-CM | POA: Insufficient documentation

## 2013-04-18 DIAGNOSIS — I10 Essential (primary) hypertension: Secondary | ICD-10-CM | POA: Insufficient documentation

## 2013-04-18 DIAGNOSIS — F319 Bipolar disorder, unspecified: Secondary | ICD-10-CM | POA: Insufficient documentation

## 2013-04-18 DIAGNOSIS — Z01818 Encounter for other preprocedural examination: Secondary | ICD-10-CM | POA: Insufficient documentation

## 2013-04-18 DIAGNOSIS — C50412 Malignant neoplasm of upper-outer quadrant of left female breast: Secondary | ICD-10-CM

## 2013-04-18 DIAGNOSIS — C50919 Malignant neoplasm of unspecified site of unspecified female breast: Secondary | ICD-10-CM | POA: Insufficient documentation

## 2013-04-18 DIAGNOSIS — Z8249 Family history of ischemic heart disease and other diseases of the circulatory system: Secondary | ICD-10-CM | POA: Insufficient documentation

## 2013-04-18 DIAGNOSIS — E785 Hyperlipidemia, unspecified: Secondary | ICD-10-CM | POA: Insufficient documentation

## 2013-04-18 DIAGNOSIS — I359 Nonrheumatic aortic valve disorder, unspecified: Secondary | ICD-10-CM

## 2013-04-18 NOTE — Progress Notes (Signed)
  Echocardiogram 2D Echocardiogram has been performed.  Cathie Beams 04/18/2013, 11:34 AM

## 2013-04-19 NOTE — Progress Notes (Signed)
At the request of Shonna Chock, PA with Anesthesia I called and spoke to Beaulah Corin, NP at [facility] regarding patient. I notified her of patient's episode on October 1 as noted in [place], [place] note. Ms. Trish Mage stated that patient has previously been worked up for atypical chest pain with an eventual referral to GI and diagnosis of Esophageal Spasms. Ms. Trish Mage stated that she felt like no additional cardiac/ medical work up was necessary prior to surgery. Requested that she fax the GI note and clearance over. Stated she will contact patient today to check on her and if she has any concerns she will contact us.

## 2013-04-19 NOTE — Progress Notes (Signed)
[facility] Comprehensive Psychosocial Assessment Clinical Social Work  Clinical Social Work was referred by distress screening protocol.  Clinical Warden/ranger met with patient in the meditation room to assess psychosocial, emotional, mental health, and spiritual needs of the patient.  Patient's knowledge about cancer and its treatment including level of understanding, reactions, goals for care, and expectations: CSW Intern encourage Patient to share how cancer is impacting her life and to identify her  most significant causes of distress.  Patient shared that she felt like a "burden" to her support system.  CSWI encouraged Patient to share what the word "burden" meant to her.  Patient stated that she has always been the caregiver and people could rely on her.  Patient also shared that because she was not able to "contribute" at this time and that emotionally it was difficult for her to not be able to help out.  Characteristics of the patient's support system:  Patient reports primary support systems to be her God Daughter and a friend.  Both resources have offered Patient residence.  Patient stated their willingness to assist her with care needs and appointments. CSWI encouraged Patient to allow support systems to assist as Patient felt comfortable.  Patient and family psychosocial functioning including strengths, limitations, and coping skills:  Patient shared how she has been the caregiver in the past and was the one everyone could rely on.  She presented that she was a strong person.  CSWI confirmed with Patient that she had many strong traits but that could also be a limitation when Patient was in need of assistance.  Patient stated that she understands how important it will be to have assistance.  Patient also stated the desire to have privacy at times. God Daughter and friend have children residing in current housing and privacy is sometimes difficult to obtain.  CSWI discussed with Patient various  resources that could assist with the housing process should Patient decide to pursue.  Identifications of barriers to care:  Housing, limited income  Availability of community resources: On follow up, CSWI will discuss support group opportunities.  Clinical Social Worker follow up needed: yes  If yes, follow up plan:  CSW Intern will follow up with Patient after surgery.   Kathy Maxwell S. [facility] Clinical Social Work Intern [employer] [phone]

## 2013-04-21 MED ORDER — CEFAZOLIN SODIUM-DEXTROSE 2-3 GM-% IV SOLR
2.0000 g | INTRAVENOUS | Status: AC
  Administered 2013-04-22: 2 g via INTRAVENOUS
  Filled 2013-04-21: qty 50

## 2013-04-21 NOTE — H&P (Signed)
Kathy Maxwell   MRN:  [id number]   Description: 57 year old female  Provider: [name], MD  Department: [facility]        Diagnoses    Carcinoma in situ of right breast    -  Primary    233.0    Breast cancer of upper-outer quadrant of left female breast        174.4          Current Vitals -   BP Pulse Temp(Src) Resp Ht Wt    120/68 68 98.4 F (36.9 C) (Temporal) 14 5\' 2"  (1.575 m) 206 lb 3.2 oz (93.532 kg)       BMI - 37.71 kg/m2                    History and Phjysical   [name], MD    Status: Signed                        HPI Kathy Maxwell is a 57 y.o. female.  She returns for further discussion following MRI guided biopsy of the left breast and the right breast, and following consultation with Dr. Drue Second.    Summary of her initial consultation with me is as follows: Kathy Maxwell is a 57 y.o. female. She is referred by Dr. Micheline Maze at the breast center [facility] for evaluation of a newly diagnosed invasive cancer of the left breast with positive lymph node metastasis. Dr. Dorothyann Peng is her primary care physician.   This patient has never had any breast problems before and did not perceive a mass. She went for screening mammograms which led to diagnostic mammograms and ultrasound of the left side. The mammograms & ultrasound showed 2.9 x 1.6 x 1.9 cm suspicious mass in the left breast, 12:30 position, 6 cm from the nipple. There was also a suspicious left axillary lymph nodes. Her breasts are quite dense, which I suspect will make mammographic followup problematic. Image guided biopsy of the left breast mass and the lymph node showed invasive mammary carcinoma, probably ductal phenotype. ER 100%, PR 98%, Ki 67 13%, HER-2 negative.   MRI was performed today, but has not been read yet. I will get a reading later today. The patient is aware of her diagnosis.   Family history is positive for breast cancer in a maternal aunt who  had a mastectomy at age 31. No other breast or ovarian cancer.   The patient has significant morbidities with bipolar disorder, hypertension, obesity. She's had TAH and BSO. She's had 2 pregnancies, 2 abortions. She is scheduled for cataract surgery in [place] next week. She states she is on Medicare because of her bipolar disorder.    Subsequent MRI showed a larger area on the  Left side, 7.4 cm in size. Biopsy of the most anterior area showed invasive and noninvasive breast cancer.  MRI also showed an enhancing focus centrally in the right breast, 3.3 cm. Biopsy of this area showed grade 2, ductal carcinoma in situ. Breast diagnostic profile is pending.   Following lengthy discussions with Dr. Drue Second and with me, she has decided to go ahead with modified radical mastectomy on the left, and partial mastectomy and sentinel node biopsy of the right, and Port-A-Cath insertion. She agrees with chemotherapy. She knows that mastectomy on the right is also an option.  We had a very long, 30 minute discussion about this  and she is comfortable with this plan. She knows that reconstruction on the left is an option, but probably should not be done immediately because of the radiation therapy. At this point she states that she is not mentioned Reconstruction.    Dr. Drue Second plans are for all for genetic counseling and genetic testing.   She knows that if she chooses lumpectomy on the right that she will have to have radiation therapy and she is comfortable without that as well.      Past Medical History   Diagnosis  Date   .  Hyperlipidemia     .  Hypertension     .  Diabetes mellitus without complication     .  Bipolar 1 disorder     .  Cancer     .  Hypertension  04/04/2013   .  Hyperlipidemia  04/04/2013   .  Bipolar 1 disorder  04/04/2013         Past Surgical History   Procedure  Laterality  Date   .  Abdominal hysterectomy       .  Cholecystectomy             Family History    Problem  Relation  Age of Onset   .  Heart disease  Mother     .  Cancer  Maternal Aunt  69       breast cancer   .  Cancer  Cousin  62       breast cancer        Social History History   Substance Use Topics   .  Smoking status:  Former Smoker       Quit date:  07/19/1987   .  Smokeless tobacco:  Never Used   .  Alcohol Use:  No         Allergies   Allergen  Reactions   .  Latex           Current Outpatient Prescriptions   Medication  Sig  Dispense  Refill   .  amLODipine (NORVASC) 10 MG tablet  Take 10 mg by mouth daily.         .  Azilsartan-Chlorthalidone 40-25 MG TABS  Take by mouth.         [name]  buPROPion (WELLBUTRIN SR) 150 MG 12 hr tablet  Take 150 mg by mouth 2 (two) times daily.         .  hydrALAZINE (APRESOLINE) 25 MG tablet  Take 25 mg by mouth 2 (two) times daily.         Demetra [employer] (EASY TOUCH LANCING DEVICE) MISC           .  Lite Touch Lancets MISC           .  losartan (COZAAR) 100 MG tablet           .  metoprolol tartrate (LOPRESSOR) 25 MG tablet  Take 25 mg by mouth 2 (two) times daily.         .  ONE TOUCH ULTRA TEST test strip           .  pantoprazole (PROTONIX) 40 MG tablet  Take 40 mg by mouth daily.         .  pioglitazone-metformin (ACTOPLUS MET) 15-850 MG per tablet  Take 1 tablet by mouth 2 (two) times daily with a meal.         .  rosuvastatin (  CRESTOR) 10 MG tablet  Take 5 mg by mouth daily.          .  sitaGLIPtin (JANUVIA) 100 MG tablet  Take 100 mg by mouth daily.             No current facility-administered medications for this visit.        Review of Systems   Constitutional: Negative for fever, chills and unexpected weight change.  HENT: Negative for hearing loss, congestion, sore throat, trouble swallowing and voice change.   Eyes: Negative for visual disturbance.  Respiratory: Negative for cough and wheezing.   Cardiovascular: Negative for chest pain, palpitations and leg swelling.  Gastrointestinal: Negative  for nausea, vomiting, abdominal pain, diarrhea, constipation, blood in stool, abdominal distention and anal bleeding.  Genitourinary: Negative for hematuria, vaginal bleeding and difficulty urinating.  Musculoskeletal: Negative for arthralgias.  Skin: Negative for rash and wound.  Neurological: Negative for seizures, syncope and headaches.  Hematological: Negative for adenopathy. Does not bruise/bleed easily.  Psychiatric/Behavioral: Negative for confusion.      Blood pressure 120/68, pulse 68, temperature 98.4 F (36.9 C), temperature source Temporal, resp. rate 14, height 5\' 2"  (1.575 m), weight 206 lb 3.2 oz (93.532 kg).   Physical Exam  Constitutional: She is oriented to person, place, and time. She appears well-developed and well-nourished. No distress.  HENT:   Head: Normocephalic and atraumatic.   Nose: Nose normal.   Mouth/Throat: No oropharyngeal exudate.  Eyes: Conjunctivae and EOM are normal. Pupils are equal, round, and reactive to light. Left eye exhibits no discharge. No scleral icterus.  Neck: Neck supple. No JVD present. No tracheal deviation present. No thyromegaly present.  Cardiovascular: Normal rate, regular rhythm, normal heart sounds and intact distal pulses.    No murmur heard. Pulmonary/Chest: Effort normal and breath sounds normal. No respiratory distress. She has no wheezes. She has no rales. She exhibits no tenderness.  Breasts are moderately large. The broad size 40 2D according to patient. Vague density in the left breast at about the 1:00 position. Minor bruising on both sides. No other masses. No axillary adenopathy.  Abdominal: Soft. Bowel sounds are normal. She exhibits no distension and no mass. There is no tenderness. There is no rebound and no guarding.  Lower midline scar  Musculoskeletal: She exhibits no edema and no tenderness.  Lymphadenopathy:    She has no cervical adenopathy.  Neurological: She is alert and oriented to person, place, and  time. She exhibits normal muscle tone. Coordination normal.  Skin: Skin is warm. No rash noted. She is not diaphoretic. No erythema. No pallor.  Psychiatric: She has a normal mood and affect. Her behavior is normal. Judgment and thought content normal.  Mild anxiety. Interacts well. Good insight.       Data Reviewed MRI. MRI biopsies. Pathology reports. Dr. Feliz Beam office notes.   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 Clinical stage T3, N1. ER 100%, PR 90%, Ki 67 13%. HER-2 negative.   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 This diagnostic profile pending   Bipolar disorder   Hypertension   Obesity   Non-insulin-dependent diabetes mellitus   History of TAH and BSO   History of laparoscopic cholecystectomy   Cataracts.      Plan    Proceed with scheduling of Port-A-Cath insertion, left modified radical mastectomy, right partial mastectomy  with needle localization, right axillary sentinel node biopsy.   I discussed the indications, details, techniques, numerous risk of the surgery with her. She's where the risks of bleeding, infection, skin necrosis, or swelling, or numbness, reoperation of the right for positive margins are positive nodes, potential for recurrence. She is aware of the risk of Port-A-Cath complications including pneumothorax, catheter infection, catheter malfunction. At this point in time she understands all these issues and all of her questions are answered. She agrees with this plan.   She has a consultation with Dr.  Hal Neer this Wednesday   Dr. Welton Flakes will refer her for genetic counseling.           [name]. Derrell Lolling, M.D., [facility] General and Minimally invasive Surgery Breast and Colorectal Surgery Office:    [phone] Pager:   [phone]

## 2013-04-22 ENCOUNTER — Encounter (HOSPITAL_COMMUNITY)
Admission: RE | Disposition: A | Discharge status: Home / Self Care | Payer: Self-pay | Source: Ambulatory Visit | Attending: General Surgery

## 2013-04-22 ENCOUNTER — Observation Stay (HOSPITAL_COMMUNITY)
Admission: RE | Admit: 2013-04-22 | Discharge: 2013-04-24 | Disposition: A | Discharge status: Home / Self Care | Payer: Medicare Other | Source: Ambulatory Visit | Attending: General Surgery | Admitting: General Surgery

## 2013-04-22 ENCOUNTER — Ambulatory Visit (HOSPITAL_COMMUNITY): Payer: Medicare Other

## 2013-04-22 ENCOUNTER — Encounter (HOSPITAL_COMMUNITY): Payer: Self-pay | Admitting: *Deleted

## 2013-04-22 ENCOUNTER — Encounter (HOSPITAL_COMMUNITY)
Admission: RE | Admit: 2013-04-22 | Discharge: 2013-04-22 | Disposition: A | Discharge status: Home / Self Care | Payer: Medicare Other | Source: Ambulatory Visit | Attending: General Surgery | Admitting: General Surgery

## 2013-04-22 ENCOUNTER — Ambulatory Visit
Admission: RE | Admit: 2013-04-22 | Discharge: 2013-04-22 | Disposition: A | Discharge status: Home / Self Care | Payer: Medicare Other | Source: Ambulatory Visit | Attending: General Surgery | Admitting: General Surgery

## 2013-04-22 ENCOUNTER — Ambulatory Visit (HOSPITAL_COMMUNITY): Payer: Medicare Other | Admitting: Certified Registered Nurse Anesthetist

## 2013-04-22 ENCOUNTER — Encounter (HOSPITAL_COMMUNITY): Payer: Self-pay | Admitting: Vascular Surgery

## 2013-04-22 ENCOUNTER — Observation Stay (HOSPITAL_COMMUNITY): Payer: Medicare Other

## 2013-04-22 DIAGNOSIS — D0590 Unspecified type of carcinoma in situ of unspecified breast: Secondary | ICD-10-CM | POA: Insufficient documentation

## 2013-04-22 DIAGNOSIS — Z79899 Other long term (current) drug therapy: Secondary | ICD-10-CM | POA: Insufficient documentation

## 2013-04-22 DIAGNOSIS — D0591 Unspecified type of carcinoma in situ of right breast: Secondary | ICD-10-CM

## 2013-04-22 DIAGNOSIS — Z6837 Body mass index (BMI) 37.0-37.9, adult: Secondary | ICD-10-CM | POA: Insufficient documentation

## 2013-04-22 DIAGNOSIS — E669 Obesity, unspecified: Secondary | ICD-10-CM | POA: Insufficient documentation

## 2013-04-22 DIAGNOSIS — Z170 Estrogen receptor positive status [ER+]: Secondary | ICD-10-CM | POA: Diagnosis present

## 2013-04-22 DIAGNOSIS — E785 Hyperlipidemia, unspecified: Secondary | ICD-10-CM | POA: Insufficient documentation

## 2013-04-22 DIAGNOSIS — C50419 Malignant neoplasm of upper-outer quadrant of unspecified female breast: Secondary | ICD-10-CM | POA: Insufficient documentation

## 2013-04-22 DIAGNOSIS — C773 Secondary and unspecified malignant neoplasm of axilla and upper limb lymph nodes: Secondary | ICD-10-CM | POA: Insufficient documentation

## 2013-04-22 DIAGNOSIS — Z23 Encounter for immunization: Secondary | ICD-10-CM | POA: Insufficient documentation

## 2013-04-22 DIAGNOSIS — Z803 Family history of malignant neoplasm of breast: Secondary | ICD-10-CM | POA: Insufficient documentation

## 2013-04-22 DIAGNOSIS — F319 Bipolar disorder, unspecified: Secondary | ICD-10-CM | POA: Insufficient documentation

## 2013-04-22 DIAGNOSIS — C50412 Malignant neoplasm of upper-outer quadrant of left female breast: Secondary | ICD-10-CM

## 2013-04-22 DIAGNOSIS — E119 Type 2 diabetes mellitus without complications: Secondary | ICD-10-CM | POA: Insufficient documentation

## 2013-04-22 DIAGNOSIS — I10 Essential (primary) hypertension: Secondary | ICD-10-CM | POA: Insufficient documentation

## 2013-04-22 DIAGNOSIS — C50119 Malignant neoplasm of central portion of unspecified female breast: Principal | ICD-10-CM | POA: Insufficient documentation

## 2013-04-22 HISTORY — PX: MASTECTOMY MODIFIED RADICAL: SHX5962

## 2013-04-22 HISTORY — PX: BREAST LUMPECTOMY WITH NEEDLE LOCALIZATION AND AXILLARY SENTINEL LYMPH NODE BX: SHX5760

## 2013-04-22 HISTORY — PX: PORTACATH PLACEMENT: SHX2246

## 2013-04-22 LAB — CREATININE, SERUM
GFR calc Af Amer: 88 mL/min — ABNORMAL LOW (ref >90)
GFR calc non Af Amer: 76 mL/min — ABNORMAL LOW (ref >90)

## 2013-04-22 LAB — GLUCOSE, CAPILLARY
Glucose-Capillary: 149 mg/dL — ABNORMAL HIGH (ref 70–99)
Glucose-Capillary: 154 mg/dL — ABNORMAL HIGH (ref 70–99)

## 2013-04-22 LAB — CBC
HCT: 30.4 % — ABNORMAL LOW (ref 36.0–46.0)
Hemoglobin: 10 g/dL — ABNORMAL LOW (ref 12.0–15.0)
MCH: 29.9 pg (ref 26.0–34.0)
MCHC: 32.9 g/dL (ref 30.0–36.0)
RDW: 13.6 % (ref 11.5–15.5)
WBC: 9 10*3/uL (ref 4.0–10.5)

## 2013-04-22 SURGERY — INSERTION, TUNNELED CENTRAL VENOUS DEVICE, WITH PORT
Anesthesia: General | Site: Chest | Laterality: Right | Wound class: Clean

## 2013-04-22 MED ORDER — LINAGLIPTIN 5 MG PO TABS
5.0000 mg | ORAL_TABLET | Freq: Every day | ORAL | Status: DC
  Administered 2013-04-22 – 2013-04-24 (×3): 5 mg via ORAL
  Filled 2013-04-22 (×3): qty 1

## 2013-04-22 MED ORDER — AMLODIPINE BESYLATE 10 MG PO TABS
10.0000 mg | ORAL_TABLET | Freq: Every day | ORAL | Status: DC
  Administered 2013-04-24: 10 mg via ORAL
  Filled 2013-04-22 (×2): qty 1

## 2013-04-22 MED ORDER — HYDRALAZINE HCL 25 MG PO TABS
25.0000 mg | ORAL_TABLET | Freq: Two times a day (BID) | ORAL | Status: DC
  Administered 2013-04-22 – 2013-04-24 (×2): 25 mg via ORAL
  Filled 2013-04-22 (×5): qty 1

## 2013-04-22 MED ORDER — METOPROLOL SUCCINATE ER 25 MG PO TB24
25.0000 mg | ORAL_TABLET | Freq: Every day | ORAL | Status: DC
  Administered 2013-04-24: 25 mg via ORAL
  Filled 2013-04-22 (×2): qty 1

## 2013-04-22 MED ORDER — 0.9 % SODIUM CHLORIDE (POUR BTL) OPTIME
TOPICAL | Status: DC | PRN
  Administered 2013-04-22 (×2): 1000 mL

## 2013-04-22 MED ORDER — ONDANSETRON HCL 4 MG/2ML IJ SOLN: 4.0000 mg | Freq: Once | INTRAMUSCULAR | Status: DC | PRN

## 2013-04-22 MED ORDER — INSULIN GLARGINE 100 UNIT/ML ~~LOC~~ SOLN
10.0000 [IU] | Freq: Every day | SUBCUTANEOUS | Status: DC
  Administered 2013-04-22 – 2013-04-23 (×2): 10 [IU] via SUBCUTANEOUS
  Filled 2013-04-22 (×3): qty 0.1

## 2013-04-22 MED ORDER — PROPOFOL 10 MG/ML IV BOLUS
INTRAVENOUS | Status: DC | PRN
  Administered 2013-04-22: 150 mg via INTRAVENOUS

## 2013-04-22 MED ORDER — MORPHINE SULFATE 2 MG/ML IJ SOLN
2.0000 mg | INTRAMUSCULAR | Status: DC | PRN
  Administered 2013-04-22: 2 mg via INTRAVENOUS
  Filled 2013-04-22: qty 1

## 2013-04-22 MED ORDER — ENOXAPARIN SODIUM 40 MG/0.4ML ~~LOC~~ SOLN
40.0000 mg | SUBCUTANEOUS | Status: DC
  Administered 2013-04-23 – 2013-04-24 (×2): 40 mg via SUBCUTANEOUS
  Filled 2013-04-22 (×2): qty 0.4

## 2013-04-22 MED ORDER — POTASSIUM CHLORIDE IN NACL 20-0.9 MEQ/L-% IV SOLN
INTRAVENOUS | Status: DC
  Administered 2013-04-22 – 2013-04-24 (×3): via INTRAVENOUS
  Filled 2013-04-22 (×5): qty 1000

## 2013-04-22 MED ORDER — ONDANSETRON HCL 4 MG PO TABS: 4.0000 mg | ORAL_TABLET | Freq: Four times a day (QID) | ORAL | Status: DC | PRN

## 2013-04-22 MED ORDER — LACTATED RINGERS IV SOLN
INTRAVENOUS | Status: DC | PRN
  Administered 2013-04-22 (×2): via INTRAVENOUS

## 2013-04-22 MED ORDER — PHENYLEPHRINE HCL 10 MG/ML IJ SOLN: INTRAMUSCULAR | Status: DC | PRN

## 2013-04-22 MED ORDER — INSULIN ASPART 100 UNIT/ML ~~LOC~~ SOLN
0.0000 [IU] | Freq: Three times a day (TID) | SUBCUTANEOUS | Status: DC
  Administered 2013-04-23 – 2013-04-24 (×4): 3 [IU] via SUBCUTANEOUS

## 2013-04-22 MED ORDER — SUCCINYLCHOLINE CHLORIDE 20 MG/ML IJ SOLN
INTRAMUSCULAR | Status: DC | PRN
  Administered 2013-04-22: 100 mg via INTRAVENOUS

## 2013-04-22 MED ORDER — SODIUM CHLORIDE 0.9 % IJ SOLN
INTRAMUSCULAR | Status: AC
  Filled 2013-04-22: qty 10

## 2013-04-22 MED ORDER — METHYLENE BLUE 1 % INJ SOLN
INTRAMUSCULAR | Status: DC | PRN
  Administered 2013-04-22: 14:00:00 via INTRAMUSCULAR

## 2013-04-22 MED ORDER — PIOGLITAZONE HCL 15 MG PO TABS
15.0000 mg | ORAL_TABLET | Freq: Two times a day (BID) | ORAL | Status: DC
  Administered 2013-04-23 – 2013-04-24 (×3): 15 mg via ORAL
  Filled 2013-04-22 (×5): qty 1

## 2013-04-22 MED ORDER — HEPARIN SOD (PORK) LOCK FLUSH 100 UNIT/ML IV SOLN
INTRAVENOUS | Status: DC | PRN
  Administered 2013-04-22: 350 [IU] via INTRAVENOUS

## 2013-04-22 MED ORDER — ONDANSETRON HCL 4 MG/2ML IJ SOLN: 4.0000 mg | Freq: Four times a day (QID) | INTRAMUSCULAR | Status: DC | PRN

## 2013-04-22 MED ORDER — ONDANSETRON HCL 4 MG/2ML IJ SOLN
INTRAMUSCULAR | Status: DC | PRN
  Administered 2013-04-22: 4 mg via INTRAMUSCULAR

## 2013-04-22 MED ORDER — PIOGLITAZONE HCL-METFORMIN HCL 15-850 MG PO TABS: 1.0000 | ORAL_TABLET | Freq: Two times a day (BID) | ORAL | Status: DC

## 2013-04-22 MED ORDER — HYDROMORPHONE HCL PF 1 MG/ML IJ SOLN
INTRAMUSCULAR | Status: AC
  Administered 2013-04-22: 0.5 mg via INTRAVENOUS
  Filled 2013-04-22: qty 1

## 2013-04-22 MED ORDER — LACTATED RINGERS IV SOLN
INTRAVENOUS | Status: DC
  Administered 2013-04-22: 11:00:00 via INTRAVENOUS

## 2013-04-22 MED ORDER — FENTANYL CITRATE 0.05 MG/ML IJ SOLN
INTRAMUSCULAR | Status: DC | PRN
  Administered 2013-04-22: 50 ug via INTRAVENOUS
  Administered 2013-04-22: 100 ug via INTRAVENOUS
  Administered 2013-04-22 (×2): 50 ug via INTRAVENOUS

## 2013-04-22 MED ORDER — AZILSARTAN-CHLORTHALIDONE 40-25 MG PO TABS: 1.0000 | ORAL_TABLET | Freq: Every day | ORAL | Status: DC

## 2013-04-22 MED ORDER — MIDAZOLAM HCL 5 MG/5ML IJ SOLN
INTRAMUSCULAR | Status: DC | PRN
  Administered 2013-04-22: 1 mg via INTRAVENOUS

## 2013-04-22 MED ORDER — CHLORHEXIDINE GLUCONATE 4 % EX LIQD: 1.0000 "application " | Freq: Once | CUTANEOUS | Status: DC

## 2013-04-22 MED ORDER — LIDOCAINE HCL (CARDIAC) 20 MG/ML IV SOLN
INTRAVENOUS | Status: DC | PRN
  Administered 2013-04-22: 100 mg via INTRAVENOUS

## 2013-04-22 MED ORDER — BUPROPION HCL ER (SR) 150 MG PO TB12
150.0000 mg | ORAL_TABLET | Freq: Every day | ORAL | Status: DC
  Administered 2013-04-23 – 2013-04-24 (×2): 150 mg via ORAL
  Filled 2013-04-22 (×2): qty 1

## 2013-04-22 MED ORDER — HEPARIN SOD (PORK) LOCK FLUSH 100 UNIT/ML IV SOLN
INTRAVENOUS | Status: AC
  Filled 2013-04-22: qty 5

## 2013-04-22 MED ORDER — BUPIVACAINE-EPINEPHRINE PF 0.25-1:200000 % IJ SOLN
INTRAMUSCULAR | Status: DC | PRN
  Administered 2013-04-22: 30 mL

## 2013-04-22 MED ORDER — ATORVASTATIN CALCIUM 10 MG PO TABS
10.0000 mg | ORAL_TABLET | Freq: Every day | ORAL | Status: DC
  Administered 2013-04-23: 10 mg via ORAL
  Filled 2013-04-22 (×2): qty 1

## 2013-04-22 MED ORDER — TECHNETIUM TC 99M SULFUR COLLOID FILTERED
1.0000 | Freq: Once | INTRAVENOUS | Status: AC | PRN
  Administered 2013-04-22: 1 via INTRADERMAL

## 2013-04-22 MED ORDER — METFORMIN HCL 850 MG PO TABS
850.0000 mg | ORAL_TABLET | Freq: Two times a day (BID) | ORAL | Status: DC
  Administered 2013-04-23 – 2013-04-24 (×3): 850 mg via ORAL
  Filled 2013-04-22 (×5): qty 1

## 2013-04-22 MED ORDER — SODIUM CHLORIDE 0.9 % IV SOLN
10.0000 mg | INTRAVENOUS | Status: DC | PRN
  Administered 2013-04-22: 20 ug/min via INTRAVENOUS

## 2013-04-22 MED ORDER — PANTOPRAZOLE SODIUM 40 MG PO TBEC
40.0000 mg | DELAYED_RELEASE_TABLET | Freq: Every day | ORAL | Status: DC
  Administered 2013-04-22 – 2013-04-24 (×3): 40 mg via ORAL
  Filled 2013-04-22 (×3): qty 1

## 2013-04-22 MED ORDER — CEFAZOLIN SODIUM-DEXTROSE 2-3 GM-% IV SOLR
2.0000 g | Freq: Three times a day (TID) | INTRAVENOUS | Status: AC
  Administered 2013-04-22 – 2013-04-23 (×3): 2 g via INTRAVENOUS
  Filled 2013-04-22 (×3): qty 50

## 2013-04-22 MED ORDER — LOSARTAN POTASSIUM 50 MG PO TABS
100.0000 mg | ORAL_TABLET | Freq: Every day | ORAL | Status: DC
  Filled 2013-04-22: qty 2

## 2013-04-22 MED ORDER — LOSARTAN POTASSIUM 50 MG PO TABS: 100.0000 mg | ORAL_TABLET | Freq: Every day | ORAL | Status: DC

## 2013-04-22 MED ORDER — SODIUM CHLORIDE 0.9 % IR SOLN
Status: DC | PRN
  Administered 2013-04-22: 14:00:00

## 2013-04-22 MED ORDER — HYDROMORPHONE HCL PF 1 MG/ML IJ SOLN
0.2500 mg | INTRAMUSCULAR | Status: DC | PRN
  Administered 2013-04-22 (×4): 0.5 mg via INTRAVENOUS

## 2013-04-22 MED ORDER — OXYCODONE-ACETAMINOPHEN 5-325 MG PO TABS
1.0000 | ORAL_TABLET | ORAL | Status: DC | PRN
  Administered 2013-04-22 – 2013-04-24 (×5): 2 via ORAL
  Filled 2013-04-22 (×5): qty 2

## 2013-04-22 MED ORDER — BUPIVACAINE-EPINEPHRINE PF 0.25-1:200000 % IJ SOLN
INTRAMUSCULAR | Status: AC
  Filled 2013-04-22: qty 30

## 2013-04-22 MED ORDER — METHYLENE BLUE 1 % INJ SOLN
INTRAMUSCULAR | Status: AC
  Filled 2013-04-22: qty 10

## 2013-04-22 SURGICAL SUPPLY — 105 items
ADH SKN CLS APL DERMABOND .7 (GAUZE/BANDAGES/DRESSINGS) ×6
APL SKNCLS STERI-STRIP NONHPOA (GAUZE/BANDAGES/DRESSINGS)
APPLIER CLIP 9.375 MED OPEN (MISCELLANEOUS) ×8
APR CLP MED 9.3 20 MLT OPN (MISCELLANEOUS) ×6
BAG DECANTER FOR FLEXI CONT (MISCELLANEOUS) ×4 IMPLANT
BENZOIN TINCTURE PRP APPL 2/3 (GAUZE/BANDAGES/DRESSINGS) ×4 IMPLANT
BINDER BREAST LRG (GAUZE/BANDAGES/DRESSINGS) IMPLANT
BINDER BREAST XLRG (GAUZE/BANDAGES/DRESSINGS) IMPLANT
BINDER BREAST XXLRG (GAUZE/BANDAGES/DRESSINGS) ×2 IMPLANT
BLADE SURG 10 STRL SS (BLADE) ×4 IMPLANT
BLADE SURG 15 STRL LF DISP TIS (BLADE) ×3 IMPLANT
BLADE SURG 15 STRL SS (BLADE) ×8
BLADE SURG ROTATE 9660 (MISCELLANEOUS) IMPLANT
CANISTER SUCTION 2500CC (MISCELLANEOUS) ×4 IMPLANT
CHLORAPREP W/TINT 10.5 ML (MISCELLANEOUS) ×8 IMPLANT
CHLORAPREP W/TINT 26ML (MISCELLANEOUS) ×8 IMPLANT
CLIP APPLIE 9.375 MED OPEN (MISCELLANEOUS) ×6 IMPLANT
CLOTH BEACON ORANGE TIMEOUT ST (SAFETY) ×12 IMPLANT
CONT SPEC 4OZ CLIKSEAL STRL BL (MISCELLANEOUS) ×5 IMPLANT
COVER PROBE W GEL 5X96 (DRAPES) IMPLANT
COVER SURGICAL LIGHT HANDLE (MISCELLANEOUS) ×11 IMPLANT
COVER TRANSDUCER ULTRASND (DRAPES) ×1 IMPLANT
CRADLE DONUT ADULT HEAD (MISCELLANEOUS) ×4 IMPLANT
DECANTER SPIKE VIAL GLASS SM (MISCELLANEOUS) ×4 IMPLANT
DERMABOND ADVANCED (GAUZE/BANDAGES/DRESSINGS) ×2
DERMABOND ADVANCED .7 DNX12 (GAUZE/BANDAGES/DRESSINGS) ×6 IMPLANT
DEVICE DUBIN SPECIMEN MAMMOGRA (MISCELLANEOUS) ×2 IMPLANT
DRAIN CHANNEL 19F RND (DRAIN) ×7 IMPLANT
DRAPE C-ARM 42X72 X-RAY (DRAPES) ×4 IMPLANT
DRAPE CHEST BREAST 15X10 FENES (DRAPES) ×2 IMPLANT
DRAPE LAPAROSCOPIC ABDOMINAL (DRAPES) ×8 IMPLANT
DRAPE UTILITY 15X26 W/TAPE STR (DRAPE) ×24 IMPLANT
DRSG ADAPTIC 3X8 NADH LF (GAUZE/BANDAGES/DRESSINGS) ×4 IMPLANT
DRSG PAD ABDOMINAL 8X10 ST (GAUZE/BANDAGES/DRESSINGS) ×8 IMPLANT
ELECT BLADE 4.0 EZ CLEAN MEGAD (MISCELLANEOUS) ×4
ELECT CAUTERY BLADE 6.4 (BLADE) ×10 IMPLANT
ELECT REM PT RETURN 9FT ADLT (ELECTROSURGICAL) ×8
ELECTRODE BLDE 4.0 EZ CLN MEGD (MISCELLANEOUS) ×3 IMPLANT
ELECTRODE REM PT RTRN 9FT ADLT (ELECTROSURGICAL) ×8 IMPLANT
EVACUATOR SILICONE 100CC (DRAIN) ×8 IMPLANT
GAUZE SPONGE 4X4 16PLY XRAY LF (GAUZE/BANDAGES/DRESSINGS) ×4 IMPLANT
GLOVE BIOGEL PI IND STRL 6.5 (GLOVE) ×2 IMPLANT
GLOVE BIOGEL PI IND STRL 7.0 (GLOVE) ×6 IMPLANT
GLOVE BIOGEL PI IND STRL 8 (GLOVE) IMPLANT
GLOVE BIOGEL PI INDICATOR 6.5 (GLOVE) ×2
GLOVE BIOGEL PI INDICATOR 7.0 (GLOVE) ×6
GLOVE BIOGEL PI INDICATOR 8 (GLOVE) ×1
GLOVE EUDERMIC 7 POWDERFREE (GLOVE) ×10 IMPLANT
GLOVE SS BIOGEL STRL SZ 6.5 (GLOVE) ×1 IMPLANT
GLOVE SS BIOGEL STRL SZ 7 (GLOVE) ×2 IMPLANT
GLOVE SS BIOGEL STRL SZ 7.5 (GLOVE) ×1 IMPLANT
GLOVE SUPERSENSE BIOGEL SZ 6.5 (GLOVE) ×1
GLOVE SUPERSENSE BIOGEL SZ 7 (GLOVE) ×2
GLOVE SUPERSENSE BIOGEL SZ 7.5 (GLOVE) ×1
GOWN PREVENTION PLUS XLARGE (GOWN DISPOSABLE) ×2 IMPLANT
GOWN STRL NON-REIN LRG LVL3 (GOWN DISPOSABLE) ×16 IMPLANT
GOWN STRL REIN XL XLG (GOWN DISPOSABLE) ×10 IMPLANT
INTRODUCER 13FR (MISCELLANEOUS) IMPLANT
INTRODUCER COOK 11FR (CATHETERS) IMPLANT
KIT BASIN OR (CUSTOM PROCEDURE TRAY) ×10 IMPLANT
KIT MARKER MARGIN INK (KITS) ×2 IMPLANT
KIT PORT POWER 8FR ISP CVUE (Catheter) ×2 IMPLANT
KIT PORT POWER 9.6FR MRI PREA (Catheter) IMPLANT
KIT PORT POWER ISP 8FR (Catheter) IMPLANT
KIT POWER CATH 8FR (Catheter) IMPLANT
KIT ROOM TURNOVER OR (KITS) ×8 IMPLANT
NDL 18GX1X1/2 (RX/OR ONLY) (NEEDLE) IMPLANT
NDL HYPO 25GX1X1/2 BEV (NEEDLE) ×3 IMPLANT
NDL PERC 18GX7CM (NEEDLE) IMPLANT
NEEDLE 18GX1X1/2 (RX/OR ONLY) (NEEDLE) ×4 IMPLANT
NEEDLE HYPO 25GX1X1/2 BEV (NEEDLE) ×12 IMPLANT
NEEDLE PERC 18GX7CM (NEEDLE) ×4 IMPLANT
NS IRRIG 1000ML POUR BTL (IV SOLUTION) ×9 IMPLANT
PACK GENERAL/GYN (CUSTOM PROCEDURE TRAY) ×7 IMPLANT
PACK SURGICAL SETUP 50X90 (CUSTOM PROCEDURE TRAY) ×6 IMPLANT
PAD ARMBOARD 7.5X6 YLW CONV (MISCELLANEOUS) ×10 IMPLANT
PENCIL BUTTON HOLSTER BLD 10FT (ELECTRODE) ×4 IMPLANT
SET INTRODUCER 12FR PACEMAKER (SHEATH) IMPLANT
SET SHEATH INTRODUCER 10FR (MISCELLANEOUS) IMPLANT
SHEATH COOK PEEL AWAY SET 9F (SHEATH) IMPLANT
SPECIMEN JAR X LARGE (MISCELLANEOUS) ×4 IMPLANT
SPONGE GAUZE 4X4 12PLY (GAUZE/BANDAGES/DRESSINGS) ×3 IMPLANT
SPONGE LAP 4X18 X RAY DECT (DISPOSABLE) ×4 IMPLANT
STAPLER VISISTAT 35W (STAPLE) ×8 IMPLANT
STRIP CLOSURE SKIN 1/2X4 (GAUZE/BANDAGES/DRESSINGS) ×4 IMPLANT
SURGILUBE 3G PEEL PACK STRL (MISCELLANEOUS) ×1 IMPLANT
SUT ETHILON 3 0 FSL (SUTURE) ×8 IMPLANT
SUT MNCRL AB 4-0 PS2 18 (SUTURE) ×12 IMPLANT
SUT PROLENE 2 0 CT2 30 (SUTURE) ×5 IMPLANT
SUT SILK 2 0 FS (SUTURE) ×3 IMPLANT
SUT SILK 2 0 SH (SUTURE) ×4 IMPLANT
SUT VIC AB 3-0 SH 18 (SUTURE) ×12 IMPLANT
SUT VIC AB 3-0 SH 27 (SUTURE) ×4
SUT VIC AB 3-0 SH 27XBRD (SUTURE) ×3 IMPLANT
SUT VICRYL AB 3 0 TIES (SUTURE) IMPLANT
SYR 20ML ECCENTRIC (SYRINGE) ×8 IMPLANT
SYR 5ML LUER SLIP (SYRINGE) ×4 IMPLANT
SYR BULB 3OZ (MISCELLANEOUS) ×2 IMPLANT
SYR CONTROL 10ML LL (SYRINGE) ×4 IMPLANT
SYRINGE 10CC LL (SYRINGE) ×4 IMPLANT
TOWEL OR 17X24 6PK STRL BLUE (TOWEL DISPOSABLE) ×12 IMPLANT
TOWEL OR 17X26 10 PK STRL BLUE (TOWEL DISPOSABLE) ×12 IMPLANT
TUBE CONNECTING 12X1/4 (SUCTIONS) ×1 IMPLANT
WATER STERILE IRR 1000ML POUR (IV SOLUTION) IMPLANT
YANKAUER SUCT BULB TIP NO VENT (SUCTIONS) ×2 IMPLANT

## 2013-04-22 NOTE — Anesthesia Procedure Notes (Signed)
Procedure Name: Intubation Date/Time: 04/22/2013 12:11 PM Performed by: Rogelia Boga Pre-anesthesia Checklist: Patient identified, Emergency Drugs available, Suction available, Patient being monitored and Timeout performed Patient Re-evaluated:Patient Re-evaluated prior to inductionOxygen Delivery Method: Circle system utilized Preoxygenation: Pre-oxygenation with 100% oxygen Intubation Type: IV induction Laryngoscope Size: Mac and 4 Grade View: Grade I Tube type: Oral Number of attempts: 1 Airway Equipment and Method: Stylet Placement Confirmation: ETT inserted through vocal cords under direct vision,  positive ETCO2 and breath sounds checked- equal and bilateral Secured at: 22 cm Tube secured with: Tape Dental Injury: Teeth and Oropharynx as per pre-operative assessment

## 2013-04-22 NOTE — Anesthesia Preprocedure Evaluation (Addendum)
Anesthesia Evaluation  Patient identified by MRN, date of birth, ID band Patient awake    Reviewed: Allergy & Precautions, H&P , NPO status , Patient's Chart, lab work & pertinent test results, reviewed documented beta blocker date and time   Airway Mallampati: II TM Distance: >3 FB Neck ROM: Full    Dental  (+) Poor Dentition, Missing and Dental Advisory Given   Pulmonary sleep apnea ,  Pt states she cannot afford CPAP         Cardiovascular hypertension, Pt. on medications and Pt. on home beta blockers + CAD     Neuro/Psych PSYCHIATRIC DISORDERS Bipolar Disorder    GI/Hepatic GERD-  Medicated and Controlled,  Endo/Other  diabetes, Type 2, Oral Hypoglycemic AgentsMorbid obesity  Renal/GU      Musculoskeletal   Abdominal   Peds  Hematology   Anesthesia Other Findings   Reproductive/Obstetrics                          Anesthesia Physical Anesthesia Plan  ASA: III  Anesthesia Plan: General   Post-op Pain Management:    Induction: Intravenous  Airway Management Planned: Oral ETT  Additional Equipment:   Intra-op Plan:   Post-operative Plan:   Informed Consent: I have reviewed the patients History and Physical, chart, labs and discussed the procedure including the risks, benefits and alternatives for the proposed anesthesia with the patient or authorized representative who has indicated his/her understanding and acceptance.   Dental advisory given  Plan Discussed with: CRNA, Surgeon and Anesthesiologist  Anesthesia Plan Comments:         Anesthesia Quick Evaluation

## 2013-04-22 NOTE — Op Note (Addendum)
Patient Name:           Kathy Maxwell   Date of Surgery:        04/22/2013  Pre op Diagnosis:      Invasive mammary carcinoma left breast, central position, 7.4 cm diameter with positive axillary lymph nodes. Clinical stage T3 N1. ER  positive. HER-2 negative.                                       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  Surgeon:                     [name]. Derrell Lolling, M.D., FACS  Assistant:                      Glenna Fellows, M.D., FACS  Operative Indications:     Kathy Maxwell is a 57 y.o. female. She is referred by Dr. Micheline Maze at the breast center [facility] for evaluation of a newly diagnosed invasive cancer of the left breast with positive lymph node metastasis. Dr. Dorothyann Peng is her primary care physician.  This patient has never had any breast problems before and did not perceive a mass. She went for screening mammograms which led to diagnostic mammograms and ultrasound of the left side. The mammograms & ultrasound showed 2.9 x 1.6 x 1.9 cm suspicious mass in the left breast, 12:30 position, 6 cm from the nipple. There was also a suspicious left axillary lymph nodes. Her breasts are quite dense, which I suspect will make mammographic followup problematic. Image guided biopsy of the left breast mass and the lymph node showed invasive mammary carcinoma, probably ductal phenotype. ER 100%, PR 98%, Ki 67 13%, HER-2 negative.  Family history is positive for breast cancer in a maternal aunt who had a mastectomy at age 57. No other  breast or ovarian cancer.  The patient has significant morbidities with bipolar disorder, hypertension, obesity. She's had TAH and BSO. She's had 2 pregnancies, 2 abortions.  Subsequent MRI showed a larger area on the Left side, 7.4 cm in size. Biopsy of the most anterior area showed invasive and noninvasive breast cancer. MRI also showed an enhancing focus centrally in the right breast, 3.3 cm. Biopsy of this area showed grade 2, ductal carcinoma in situ. .  Following lengthy discussions with Dr. Drue Second and with me, she has decided to go ahead with modified radical mastectomy on the left, and partial mastectomy and sentinel node biopsy of the right, and Port-A-Cath insertion. She agrees with chemotherapy. She knows that mastectomy on the right is also an option. We had a very long, 30 minute discussion about this and she is comfortable with this plan. She knows that reconstruction on the left is an option, but probably should not be done immediately because of the radiation therapy. At this point she states that she is not interested in reconstruction.  Dr. Drue Second plans are for genetic counseling and genetic testing.  She knows that if she chooses lumpectomy on the right that she will have to have radiation therapy and she is comfortable without that as well.   Operative Findings:       During the Port-A-Cath insertion, the wire would not go down the superior vena cava from the right subclavian position, but we were able to easily do this from the right internal jugular position. The left mastectomy was performed uneventfully. There was a palpable thickening centrally within the left breast and there were some palpably enlarged lymph nodes in the left axilla. I identified and preserved the thoracodorsal neurovascular bundle and the long thoracic nerve. On the right side the area of DCIS was very deep within the breast, 8.5 cm deep to the skin. I did a very generous lumpectomy to get this out and  also reexcised the medial margin because I thought I saw  the tip of the wire medially. The specimen mammogram looked good. In the right axilla I found 4 sentinel lymph nodes.  Procedure in Detail:          The patient underwent wire localization by Dr. Jean Rosenthal at the breast center [facility]. This was the right breast.  She was then brought to the holding area at [facility] and underwent nuclear medicine injection of the right breast by the nuclear medicine technician.  The patient was taken to the operating room. Gen. Anesthesia was induced. Following alcohol prep I injected the right breast with 5 cc of blue dye. This was 2 cc of methylene blue mixed with 3 cc of saline, injected into the subareolar area. We massaged the right breast for a few minutes.   We then positioned the patient with her arms at her sides and a roll behind her shoulders at her neck slightly extended. The neck and entire chest were prepped and draped in a sterile fashion. 0.5% Marcaine with epinephrine was used as local infiltration anesthetic. A right subclavian venipuncture was performed and a guidewire inserted without difficulty. Fluoroscopy showed that the wire traversed  across the midline into the left subclavian vein. We tried numerous times to pull this back and redirect it down into the superior vena cava but were unable to do that and so we removed the wire. Using the  ultrasound machine I  imaged the right carotid artery and right internal jugular vein. I performed a right internal jugular venipuncture and inserted the guidewire into the central circulation and superior vena cava under fluoroscopic guidance. Using a marking pen and the C-arm I  marked a template on the chest wall to designate where the tip of the wire should be in the superior  vena cava. A small incision was made in the right neck at the wire insertion site. I made a transverse incision in the infraclavicular area medially. Some subcutaneous fat was  debrided. Using a tunneling device under the catheter from the neck incision to the port pocket incision in the infraclavicular area. I measured the length of the catheter and cut the catheter 25 cm in length. The catheter was then secured to the port and flushed with heparinized saline. The port was sutured to the pectoralis fascia with 3 interrupted sutures of 3-0 Prolene. I inserted the dilator and peel-away sheath assembly over the wire into the central venous circulation. I removed the wire and the dilator and threaded the catheter through the peel-away sheath and removed the peel-away sheath.  I had excellent blood return and it flushed well. Fluoroscopy confirmed that the tip of the catheter was in this Superior vena cava and there was no deformity of the catheter anywhere along its course. The port and catheter were flushed with concentrated heparin. The subcutaneous  tissue was closed with 3-0 Vicryl and the skin incisions closed with subcuticular sutures of 4-0 Monocryl and Dermabond.  Next I measured and planned a transverse elliptical incision to perform a left mastectomy. The incision was made with the knife.  Skin flaps were raised superiorly to the infraclavicular area, medially to the parasternal area, inferiorly to the anterior rectus sheath and laterally to latissimus dorsi muscle. The left breast was dissected off of the pectoralis major and pectoralis minor muscle with electrocautery. Laterally I incised the clavipectoral fascia and dissected up into the axilla. I took the dissection  up to the left axillary vein. One superficial tributary was controlled with metal clips and divided. A couple small sensory nerves were divided between metal clips. I took all the level I and level II node contents out of the axilla. On several occasions I identified and preserved the long thoracic nerve and the thoracodorsal neurovascular bundle. The breast and the axillary contents were sent in one specimen to  the lab. Hemostasis was excellent and achieved with electrocautery. The wound is irrigated with saline. Two 19 French Blake drains were placed, one up across the skin flaps and one up in the axilla. These were brought out through separate stab incisions inferiorly and sutured to the skin with nylon sutures and connected to suction bulbs. Subcutaneous tissue was closed with 3-0 Vicryl sutures and skin closed a running subcuticular suture of 4-0 Monocryl and Dermabond.  Attention was turned to the right breast. The localizing wire was inserted from a lateral position. I made a transverse radially oriented incision at approximately the 8:30 position. Dissection was carried down into the breast tissue where a fairly generous lumpectomy was performed. I saw  the tip of the wire medially and so after removing the specimen I reexcised a medial margin and  marked it as well. The primary lumpectomy specimen was removed and marked with silk sutures and the 6 color ink kit.. The specimen mammogram showed that we had removed the area in question and a marker clips and the wire were in the specimen. Specimen was marked and sent to the lab. The reexcised medial margin was sent to the lab. Lumpectomy incision was irrigated with saline. Hemostasis was excellent. The breast tissues were closed in several layers with 3-0 Vicryl sutures and the skin closed with running subcuticular suture of 4-0 Monocryl and Dermabond.  i made an incision in the right axilla at the hairline. Using the neoprobe I dissected down into the axillary space. I found four very hot, very blue sentinel nodes and removed these individually. After all 4 removed there was very little radioactivity left. These were sent as separate specimens. It was irrigated with saline. Hemostasis was excellent. The wound was closed in layers with 3-0 Vicryl and 4-0 Monocryl and Dermabond.  The breast binder was placed. The patient tolerated the procedure well was taken  recovery in stable condition. EBL 150 cc or less. Counts correct.       [name]. Derrell Lolling, M.D., FACS General and Minimally Invasive Surgery Breast and Colorectal Surgery  04/22/2013 3:55 PM

## 2013-04-22 NOTE — Interval H&P Note (Signed)
History and Physical Interval Note:  04/22/2013 11:20 AM  Kathy Maxwell  has presented today for surgery, with the diagnosis of left breast cancer and  right breast DCIS   The goals and the various methods of treatment have been discussed with the patient and family. After consideration of risks, benefits and other options for treatment, the patient has consented to  Procedure(s) with comments: INSERTION PORT-A-CATH (N/A) MASTECTOMY MODIFIED RADICAL (Left) BREAST LUMPECTOMY WITH NEEDLE LOCALIZATION AND AXILLARY SENTINEL LYMPH NODE BX (Right) - needle localization at 9:30 nuc med 11:30  as a surgical intervention .  The patient's history has been reviewed, patient examined today, no change in status, stable for surgery.  I have reviewed the patient's chart and labs.  Questions were answered to the patient's satisfaction.     [name]

## 2013-04-22 NOTE — Preoperative (Signed)
Beta Blockers   Reason not to administer Beta Blockers:Not Applicable, Pt took Metoprolol at 1000 this am

## 2013-04-22 NOTE — Transfer of Care (Signed)
Immediate Anesthesia Transfer of Care Note  Patient: Kathy Maxwell  Procedure(s) Performed: Procedure(s): INSERTION PORT-A-CATH (Right) MASTECTOMY MODIFIED RADICAL (Left) BREAST LUMPECTOMY WITH NEEDLE LOCALIZATION AND AXILLARY SENTINEL LYMPH NODE BX (Right)  Patient Location: PACU  Anesthesia Type:General  Level of Consciousness: lethargic and responds to stimulation  Airway & Oxygen Therapy: Patient Spontanous Breathing and Patient connected to nasal cannula oxygen  Post-op Assessment: Report given to PACU RN  Post vital signs: Reviewed and stable  Complications: No apparent anesthesia complications

## 2013-04-23 LAB — GLUCOSE, CAPILLARY
Glucose-Capillary: 139 mg/dL — ABNORMAL HIGH (ref 70–99)
Glucose-Capillary: 122 mg/dL — ABNORMAL HIGH (ref 70–99)
Glucose-Capillary: 124 mg/dL — ABNORMAL HIGH (ref 70–99)
Glucose-Capillary: 118 mg/dL — ABNORMAL HIGH (ref 70–99)

## 2013-04-23 LAB — BASIC METABOLIC PANEL
BUN: 20 mg/dL (ref 6–23)
CO2: 24 mEq/L (ref 19–32)
Calcium: 7.6 mg/dL — ABNORMAL LOW (ref 8.4–10.5)
Calcium: 8.4 mg/dL (ref 8.4–10.5)
Chloride: 109 mEq/L (ref 96–112)
Chloride: 101 mEq/L (ref 96–112)
Creatinine, Ser: 0.93 mg/dL (ref 0.50–1.10)
GFR calc Af Amer: 76 mL/min — ABNORMAL LOW (ref >90)
GFR calc Af Amer: 73 mL/min — ABNORMAL LOW (ref >90)
Potassium: 4 mEq/L (ref 3.5–5.1)

## 2013-04-23 LAB — CBC
HCT: 29.7 % — ABNORMAL LOW (ref 36.0–46.0)
Hemoglobin: 9.7 g/dL — ABNORMAL LOW (ref 12.0–15.0)
MCHC: 32.7 g/dL (ref 30.0–36.0)
MCV: 91.1 fL (ref 78.0–100.0)
Platelets: 266 10*3/uL (ref 150–400)
RDW: 13.5 % (ref 11.5–15.5)

## 2013-04-23 MED ORDER — SODIUM CHLORIDE 0.9 % IV BOLUS (SEPSIS)
500.0000 mL | Freq: Once | INTRAVENOUS | Status: AC
  Administered 2013-04-23: 500 mL via INTRAVENOUS

## 2013-04-23 NOTE — Anesthesia Postprocedure Evaluation (Signed)
  Anesthesia Post-op Note  Patient: VIVIENNE SANGIOVANNI  Procedure(s) Performed: Procedure(s): INSERTION PORT-A-CATH (Right) MASTECTOMY MODIFIED RADICAL (Left) BREAST LUMPECTOMY WITH NEEDLE LOCALIZATION AND AXILLARY SENTINEL LYMPH NODE BX (Right)  Patient Location: PACU  Anesthesia Type:General  Level of Consciousness: awake, alert , oriented and patient cooperative  Airway and Oxygen Therapy: Patient Spontanous Breathing  Post-op Pain: mild  Post-op Assessment: Post-op Vital signs reviewed, Patient's Cardiovascular Status Stable, Respiratory Function Stable, Patent Airway, No signs of Nausea or vomiting and Pain level controlled  Post-op Vital Signs: stable  Complications: No apparent anesthesia complications

## 2013-04-23 NOTE — Progress Notes (Signed)
1 Day Post-Op  Subjective: Stable and alert. Getting up to commode and voiding uneventfully. Good urine output. Pain seems controlled with analgesics as ordered. Tolerating ice chips and liquids. Has not had any solid food much yet.  Both drains functioning.330 cc and overnight total. Fluid is thin and bloody.  Lab work reveals potassium 5.9. BUN 20. Calcium 7.6. Glucose 129.  Chest x-ray shows Port-A-Cath tip in upper atrium. No pneumothorax. Appears well positioned.  Objective: Vital signs in last 24 hours: Temp:  [96.8 F (36 C)-98 F (36.7 C)] 97.5 F (36.4 C) (10/07 0535) Pulse Rate:  [61-77] 66 (10/07 0636) Resp:  [9-18] 17 (10/07 0535) BP: (94-184)/(42-74) 122/49 mmHg (10/07 0636) SpO2:  [94 %-100 %] 97 % (10/07 0535) Last BM Date: 04/21/13  Intake/Output from previous day: 10/06 0701 - 10/07 0700 In: 1900 [I.V.:1900] Out: 1680 [Urine:1300; Drains:330; Blood:50] Intake/Output this shift:      Exam: General appearance: alert. Mental status normal and at baseline. No distress. Cooperative. Good spirits. Resp: clear to auscultation bilaterally Breasts: , right lumpectomy incision and right axillary incision looked fine. No hematoma. No arm swelling or pain. Port-A-Cath site looks good. Mild tissue edema but no hematoma. Neck incision looks fine. Left mastectomy wound looks good. Skin flaps healthy and viable. No necrosis. No retained fluid. Left arm without swelling. Slight pain in axilla.  Lab Results:  Results for orders placed during the hospital encounter of 04/22/13 (from the past 24 hour(s))  GLUCOSE, CAPILLARY     Status: Abnormal   Collection Time    04/22/13 11:05 AM      Result Value Range   Glucose-Capillary 149 (*) 70 - 99 mg/dL  CBC     Status: Abnormal   Collection Time    04/22/13  8:34 PM      Result Value Range   WBC 9.0  4.0 - 10.5 K/uL   RBC 3.35 (*) 3.87 - 5.11 MIL/uL   Hemoglobin 10.0 (*) 12.0 - 15.0 g/dL   HCT 41.3 (*) 24.4 - 01.0 %   MCV  90.7  78.0 - 100.0 fL   MCH 29.9  26.0 - 34.0 pg   MCHC 32.9  30.0 - 36.0 g/dL   RDW 27.2  53.6 - 64.4 %   Platelets 248  150 - 400 K/uL  CREATININE, SERUM     Status: Abnormal   Collection Time    04/22/13  8:34 PM      Result Value Range   Creatinine, Ser 0.83  0.50 - 1.10 mg/dL   GFR calc non Af Amer 76 (*) >90 mL/min   GFR calc Af Amer 88 (*) >90 mL/min  BASIC METABOLIC PANEL     Status: Abnormal   Collection Time    04/23/13  6:02 AM      Result Value Range   Sodium 140  135 - 145 mEq/L   Potassium 5.9 (*) 3.5 - 5.1 mEq/L   Chloride 109  96 - 112 mEq/L   CO2 25  19 - 32 mEq/L   Glucose, Bld 129 (*) 70 - 99 mg/dL   BUN 20  6 - 23 mg/dL   Creatinine, Ser 0.34  0.50 - 1.10 mg/dL   Calcium 7.6 (*) 8.4 - 10.5 mg/dL   GFR calc non Af Amer 66 (*) >90 mL/min   GFR calc Af Amer 76 (*) >90 mL/min     Studies/Results: @RISRSLT24 @  . amLODipine  10 mg Oral Daily  . atorvastatin  10 mg  Oral q1800  . Azilsartan-Chlorthalidone  1 tablet Oral Daily  . buPROPion  150 mg Oral Daily  .  ceFAZolin (ANCEF) IV  2 g Intravenous Q8H  . enoxaparin (LOVENOX) injection  40 mg Subcutaneous Q24H  . hydrALAZINE  25 mg Oral BID  . insulin aspart  0-20 Units Subcutaneous TID WC  . insulin glargine  10 Units Subcutaneous QHS  . linagliptin  5 mg Oral Daily  . losartan  100 mg Oral Daily  . metFORMIN  850 mg Oral BID WC  . metoprolol succinate  25 mg Oral Daily  . pantoprazole  40 mg Oral Daily  . pioglitazone  15 mg Oral BID WC     Assessment/Plan: s/p Procedure(s): INSERTION PORT-A-CATH MASTECTOMY MODIFIED RADICAL BREAST LUMPECTOMY WITH NEEDLE LOCALIZATION AND AXILLARY SENTINEL LYMPH NODE BX   POD#1:  Left modified radical mastectomy, right partial mastectomy with sentinel node biopsy, Port-A-Cath insertion. Stable.  moderate drainage from drains. Check CBC Advance diet and activities Teach drain care Home tomorrow - her Goddaughter to stay with her.  Hyperkalemia. No obvious  reason for this. Repeat B-met to make sure this is not lab error.  Bipolar disorder.  Hypertension  NIDDM. Well controlled on sliding scale insulin plus Lantus plus Glucophage and Actos. Continue monitoring.  DVT prophylaxis. Lovenox started.  @PROBHOSP @  LOS: 1 day    Jaimere Feutz M. Derrell Lolling, M.D., [facility] General and Minimally invasive Surgery Breast and Colorectal Surgery Office:   [phone] Pager:   [phone]  04/23/2013  . .prob

## 2013-04-24 ENCOUNTER — Encounter (HOSPITAL_COMMUNITY): Payer: Self-pay | Admitting: General Surgery

## 2013-04-24 ENCOUNTER — Telehealth (INDEPENDENT_AMBULATORY_CARE_PROVIDER_SITE_OTHER): Payer: Self-pay

## 2013-04-24 LAB — BASIC METABOLIC PANEL
BUN: 19 mg/dL (ref 6–23)
Calcium: 8.3 mg/dL — ABNORMAL LOW (ref 8.4–10.5)
Creatinine, Ser: 0.98 mg/dL (ref 0.50–1.10)
GFR calc Af Amer: 72 mL/min — ABNORMAL LOW (ref >90)
GFR calc non Af Amer: 62 mL/min — ABNORMAL LOW (ref >90)
Glucose, Bld: 121 mg/dL — ABNORMAL HIGH (ref 70–99)

## 2013-04-24 LAB — GLUCOSE, CAPILLARY
Glucose-Capillary: 135 mg/dL — ABNORMAL HIGH (ref 70–99)
Glucose-Capillary: 127 mg/dL — ABNORMAL HIGH (ref 70–99)

## 2013-04-24 MED ORDER — HYDROCODONE-ACETAMINOPHEN 5-325 MG PO TABS: 1.0000 | ORAL_TABLET | ORAL | Status: DC | PRN

## 2013-04-24 MED ORDER — DIPHENHYDRAMINE HCL 25 MG PO CAPS
25.0000 mg | ORAL_CAPSULE | Freq: Four times a day (QID) | ORAL | Status: DC | PRN
Start: 2013-04-24 — End: 2013-04-24
  Administered 2013-04-24: 25 mg via ORAL
  Filled 2013-04-24: qty 1

## 2013-04-24 MED ORDER — INFLUENZA VAC SPLIT QUAD 0.5 ML IM SUSP
0.5000 mL | INTRAMUSCULAR | Status: AC | PRN
  Administered 2013-04-24: 0.5 mL via INTRAMUSCULAR
  Filled 2013-04-24: qty 0.5

## 2013-04-24 MED ORDER — INFLUENZA VAC SPLIT QUAD 0.5 ML IM SUSP: 0.5000 mL | INTRAMUSCULAR | Status: DC

## 2013-04-24 NOTE — Telephone Encounter (Deleted)
Nurse unable to print AVS, please advise [phone]

## 2013-04-24 NOTE — Discharge Summary (Signed)
Patient ID: Kathy Maxwell [id number] 57 y.o. [birth year]  Admit date: 04/22/2013  Discharge date and time: No discharge date for patient encounter.  Admitting Physician: [name]  Discharge Physician: [name]  Admission Diagnoses: left breast cancer  right breast DCIS   Discharge Diagnoses:1)    Invasive mammary carcinoma left breast, central position, 7.4 cm diameter with positive axillary lymph nodes. Clinical stage T3 N1. ER positive. HER-2 negative.   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   4)   Hypertension   5)   NIDDM.   Operations: Procedure(s): INSERTION PORT-A-CATH MASTECTOMY MODIFIED RADICAL BREAST LUMPECTOMY WITH NEEDLE LOCALIZATION AND AXILLARY SENTINEL LYMPH NODE BX  Admission Condition: good  Discharged Condition: good  Indication for Admission: Kathy Maxwell is a 57 y.o. female. She is referred by Dr. Micheline Maze at the breast center [facility] for evaluation of a newly diagnosed invasive cancer of the left breast with positive lymph node metastasis. Dr. Dorothyann Peng is her primary care physician.  This patient has never had any breast problems before and did not perceive a mass. She went for screening mammograms which led to diagnostic mammograms and ultrasound of the left side. The mammograms & ultrasound showed 2.9 x 1.6 x 1.9 cm suspicious mass in the left breast, 12:30 position, 6 cm from the nipple. There was also a suspicious left axillary lymph nodes. Her breasts are quite dense, which I suspect will make mammographic followup problematic. Image guided biopsy of the left breast mass and the lymph node showed invasive mammary carcinoma, probably ductal phenotype. ER 100%, PR 98%, Ki 67 13%, HER-2 negative.  Family history is positive for breast cancer in a maternal aunt who had a mastectomy at age 36. No other breast or ovarian cancer.  The patient has significant  morbidities with bipolar disorder, hypertension, obesity. She's had TAH and BSO. She's had 2 pregnancies, 2 abortions.  Subsequent MRI showed a larger area on the Left side, 7.4 cm in size. Biopsy of the most anterior area showed invasive and noninvasive breast cancer. MRI also showed an enhancing focus centrally in the right breast, 3.3 cm. Biopsy of this area showed grade 2, ductal carcinoma in situ. .  Following lengthy discussions with Dr. Drue Second and with me, she has decided to go ahead with modified radical mastectomy on the left, and partial mastectomy and sentinel node biopsy of the right, and Port-A-Cath insertion. She agrees with chemotherapy. She knows that mastectomy on the right is also an option. We had a very long, 30 minute discussion about this and she is comfortable with this plan. She knows that reconstruction on the left is an option, but probably should not be done immediately because of the radiation therapy. At this point she states that she is not interested in reconstruction.  Dr. Drue Second plans are for genetic counseling and genetic testing.  She knows that if she chooses lumpectomy on the right that she will have to have radiation therapy and she is comfortable without that as well   Hospital Course: and the day of admission the patient was taken to the operating room and underwent Port-A-Cath insertion, left modified radical mastectomy, and right partial mastectomy and sentinel node biopsy. The surgery was uneventful. The final surgical pathology is pending at this time.  On postoperative day #1 the patient was feeling fairly well. Incisions were clean, pain was controlled with pain medication. She  had moderate thin bloody drainage from her drains no wound problems. Hemoglobin was checked and was 9.7, being 10.0 the day before. This was felt to be stable. Vital signs were stable. On postop day 2 she was stable. Angulating and tolerating regular diet. Her wounds look  good. Skin flaps on the left mastectomy were healthy and no hematoma present. Drainage was moderate volume, becoming more thin. Right axillary and right lumpectomy incisions were soft no hematoma. Chest x-ray showed good position of the Port-A-Cath and no pneumothorax.  During her hospital stay her diabetes mellitus was controlled with oral medication, Lantus insulin, and sliding scale insulin with good control.  She was instructed in diet and activities. She was instructed in drain care and drainage record keeping. She was asked to return to see me in the office in one week for a wound check. She was given a prescription for Vicodin for pain. She will go home and her goddaughter will stay with her.  Consults: None  Significant Diagnostic Studies: surgical pathology  Treatments: IV hydration and surgery: insertion of Port-A-Cath, left modified radical mastectomy, right partial mastectomy and sentinel node biopsy.  Disposition: Home  Patient Instructions:    Medication List         amLODipine 10 MG tablet  Commonly known as:  NORVASC  Take 10 mg by mouth daily.     Azilsartan-Chlorthalidone 40-25 MG Tabs  Take 1 tablet by mouth daily.     buPROPion 150 MG 12 hr tablet  Commonly known as:  WELLBUTRIN SR  Take 150 mg by mouth daily.     hydrALAZINE 25 MG tablet  Commonly known as:  APRESOLINE  Take 25 mg by mouth 2 (two) times daily.     HYDROcodone-acetaminophen 5-325 MG per tablet  Commonly known as:  NORCO/VICODIN  Take 1-2 tablets by mouth every 4 (four) hours as needed for pain.     JANUVIA 100 MG tablet  Generic drug:  sitaGLIPtin  Take 100 mg by mouth daily.     losartan 100 MG tablet  Commonly known as:  COZAAR  Take 100 mg by mouth daily.     metoprolol succinate 25 MG 24 hr tablet  Commonly known as:  TOPROL-XL  Take 25 mg by mouth daily.     pantoprazole 40 MG tablet  Commonly known as:  PROTONIX  Take 40 mg by mouth daily.     pioglitazone-metformin  15-850 MG per tablet  Commonly known as:  ACTOPLUS MET  Take 1 tablet by mouth 2 (two) times daily with a meal.     rosuvastatin 5 MG tablet  Commonly known as:  CRESTOR  Take 5 mg by mouth daily.     simvastatin 40 MG tablet  Commonly known as:  ZOCOR  Take 40 mg by mouth every evening.        Activity: activity as tolerated Diet: low fat, low cholesterol diet Wound Care: as directed  Follow-up:  With Dr. Derrell Lolling in 1 week.  Signed: [name]. Derrell Lolling, M.D., FACS General and minimally invasive surgery Breast and Colorectal Surgery  04/24/2013, 6:05 AM

## 2013-04-25 ENCOUNTER — Telehealth (INDEPENDENT_AMBULATORY_CARE_PROVIDER_SITE_OTHER): Payer: Self-pay | Admitting: General Surgery

## 2013-04-25 NOTE — Telephone Encounter (Signed)
Final pathology report is completed. The left modified radical mastectomy shows invasive ductal carcinoma, multiple foci, largest being 5.5 cm. Metastatic carcinoma in 4/22 lymph nodes. Resection margins negative. Stage T3,N2a.  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 On the original resection the DCIS was broadly less than 1 mm from the medial margin. I did resect additional medial margin and they found DCIS to be focally less than 1 mm from the new medial margin. Lymph nodes on the right were negative.  I told her that we would discuss her situation with the medical oncologist and the radiation oncologist. I told her that I thought that she needed radiation therapy on both sides, probably with a boost to the lumpectomy site. I told her that we would hold off on any reexcision on the right side unless the other physicians felt strongly that it needed to be done.  It is not clear whether there would be any oncologic advantage to reexcision of margins on the right, considering a locally advanced cancer on the left.  She expressed understanding. She has an appointment for a visit in the office next week.   [name]. Derrell Lolling, M.D., [facility] General and Minimally invasive Surgery Breast and Colorectal Surgery Office:   [phone] Pager:   [phone]

## 2013-04-29 ENCOUNTER — Encounter: Payer: Self-pay | Admitting: *Deleted

## 2013-04-29 ENCOUNTER — Encounter (HOSPITAL_COMMUNITY)
Admission: RE | Admit: 2013-04-29 | Discharge: 2013-04-29 | Disposition: A | Discharge status: Home / Self Care | Payer: Medicare Other | Source: Ambulatory Visit | Attending: Oncology | Admitting: Oncology

## 2013-04-29 ENCOUNTER — Encounter (HOSPITAL_COMMUNITY): Payer: Self-pay

## 2013-04-29 ENCOUNTER — Ambulatory Visit (INDEPENDENT_AMBULATORY_CARE_PROVIDER_SITE_OTHER): Payer: Medicare Other

## 2013-04-29 DIAGNOSIS — C50919 Malignant neoplasm of unspecified site of unspecified female breast: Secondary | ICD-10-CM | POA: Insufficient documentation

## 2013-04-29 DIAGNOSIS — R234 Changes in skin texture: Secondary | ICD-10-CM | POA: Insufficient documentation

## 2013-04-29 DIAGNOSIS — C50412 Malignant neoplasm of upper-outer quadrant of left female breast: Secondary | ICD-10-CM

## 2013-04-29 DIAGNOSIS — Z4889 Encounter for other specified surgical aftercare: Secondary | ICD-10-CM

## 2013-04-29 LAB — GLUCOSE, CAPILLARY: Glucose-Capillary: 133 mg/dL — ABNORMAL HIGH (ref 70–99)

## 2013-04-29 MED ORDER — HYDROCODONE-ACETAMINOPHEN 5-325 MG PO TABS: 1.0000 | ORAL_TABLET | ORAL | Status: DC | PRN

## 2013-04-29 MED ORDER — FLUDEOXYGLUCOSE F - 18 (FDG) INJECTION
18.7000 | Freq: Once | INTRAVENOUS | Status: AC | PRN
  Administered 2013-04-29: 18.7 via INTRAVENOUS

## 2013-04-29 NOTE — Progress Notes (Signed)
Pt came in for drain check.  Her 2 drains have been putting out too much the past two days.  I will leave them in.  I changed the bandage over the drains.  Her incisions are healing well.  She is having pain especially when she moves her arms.  I have her a refill on her Hydrocodone per protocol.  Dr Magnus Ivan signed the RX.  I told her to call me by the end of the week if her output slows down enough.  I scheduled her to see Dr Luisa Hart Monday to check her if.  Dr Derrell Lolling will be out of the office.  I kept her appointment with Dr Derrell Lolling 10/30.  She will call with any concerns.

## 2013-04-29 NOTE — Progress Notes (Signed)
Mailed after appt letter to pt. 

## 2013-04-29 NOTE — Patient Instructions (Signed)
Call me later this week if your drains are draining less than 15 cc per day for 2 days in a row.  Call with any concerns of infection such as redness, or fever.  Your incisions look good.  I will have you see one of Dr Jacinto Halim partners next week because he will be at the hospital.  Keep your appointment for 10/30 with Dr Derrell Lolling.

## 2013-05-03 ENCOUNTER — Telehealth (INDEPENDENT_AMBULATORY_CARE_PROVIDER_SITE_OTHER): Payer: Self-pay | Admitting: *Deleted

## 2013-05-03 ENCOUNTER — Encounter (INDEPENDENT_AMBULATORY_CARE_PROVIDER_SITE_OTHER): Payer: Self-pay | Admitting: General Surgery

## 2013-05-03 ENCOUNTER — Ambulatory Visit (INDEPENDENT_AMBULATORY_CARE_PROVIDER_SITE_OTHER): Payer: Medicare Other | Admitting: General Surgery

## 2013-05-03 VITALS — BP 124/82 | HR 90 | Temp 98.7°F | Resp 18 | Ht 62.0 in | Wt 206.0 lb

## 2013-05-03 DIAGNOSIS — D0590 Unspecified type of carcinoma in situ of unspecified breast: Secondary | ICD-10-CM

## 2013-05-03 DIAGNOSIS — C50412 Malignant neoplasm of upper-outer quadrant of left female breast: Secondary | ICD-10-CM

## 2013-05-03 DIAGNOSIS — C50419 Malignant neoplasm of upper-outer quadrant of unspecified female breast: Secondary | ICD-10-CM

## 2013-05-03 DIAGNOSIS — D0591 Unspecified type of carcinoma in situ of right breast: Secondary | ICD-10-CM

## 2013-05-03 NOTE — Progress Notes (Signed)
Patient ID: Kathy Maxwell, female   DOB: Feb 08, 1956, 57 y.o.   MRN: [id number] History: This patient underwent left modified radical mastectomy, right partial mastectomy with needle localization, reexcision of medial margin, right axillary sentinel node biopsy, insertion of Port-A-Cath on 04/22/2013. The final pathology report shows invasive cancer on the left side, multifocal, largest focus by 5.5 cm, 4/22 lymph nodes positive.   the initial lumpectomy specimen on the right shows ductal carcinoma in situ, with DCIS broadly less than 1 mm to the medial margin. I could tell that I was close from the specimen mammogram and so I reexcised the medial margin. Reexcision margins shows DCIS and it shows that now there is DCIS less than 1 mm to medial margin, but it is now only a focal area.   She has an appointment see Drs. Welton Flakes and [place]. I discussed her pathology report with her and told her that she would most likely receive chemotherapy followed by radiation therapy on both sides. I told her that I would seek the opinion of both of her other treating physicians about the benefit of reexcision of the medial margin, which I think is going to be minimal considering the locally advanced disease of the left side. Her tumors are receptor positive, HER-2-negative.  She came in today because he said there was some bleeding from the drain site  Exam:  Patient looks well. No distress. Chest: Port-A-Cath site looks fine. No hematoma or infection Breasts the left mastectomy incision is healing normally. Some redundant adipose tissue, as expected but no infection, no seroma, no hematoma. I was able to remove the axillary drain but left the skin flap drain in because it was draining too much. The right lumpectomy incision right axillary incision looked normal  Assessment:   multifocal invasive ductal carcinoma left breast, receptor positive, HER-2-negative, 4/22 nodes positive, stage T3, N2a.  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 Focally close medial margin ring margin following reexcision of margins. Recovering uneventfully following right partial mastectomy, reexcision of medial margin,  And right axillary sentinel node biopsy. Stage Tis, N0. Bipolar disorder Hypertension Obesity History TAH and BSO  Plan: Return to see me on October 30, sooner if the drainage subsides. Refer to physical therapy after remaining drain removed Keep appointments with Dr. Drue Second and Dr. Michell Heinrich. At this point in time, I do not plan to re excise the medial margin again, unless Dr. Welton Flakes or Dr. Michell Heinrich feels that it would be a significant value in terms of her outcomes. Considering the local locally advanced disease in the left and the fact she will probably receive radiation therapy bilaterally, I doubt that another excision of the medial margin of the right would affect outcome.    [name]. Derrell Lolling, M.D., [facility] General and Minimally invasive Surgery Breast and Colorectal Surgery Office:   [phone] Pager:   [phone]

## 2013-05-03 NOTE — Patient Instructions (Signed)
You are healing without any sign of complication. There's no sign of any infection, fluid collection, or active bleeding. We removed one of the 2 drains today.  We discussed the pathology report. Be sure to keep your appointment Dr. Drue Second and  Dr. Valentina Gu. I do not plan any further surgery on the right breast for the close margin, unless the other physicians think this will make a big difference in your treatment outcome.  Continue to keep a written record of the drainage.  Return to see Dr. Derrell Lolling at your  regular scheduled appointment, October 30.  If the drainage on the left-side decreases to less than 15 cc per day for 2 days in a row, call the office and we might bring you in sooner to get the drain out sooner.

## 2013-05-03 NOTE — Telephone Encounter (Signed)
Patient called in this morning to report that she is having bleeding from around her drain site on the left side.  Patient states "I want someone to look at this today."  Paged Dr. Derrell Lolling at this time who states he will see the patient and wants her added to the end of his current schedule.  Called patient to update her.  Patient is agreeable with appt time today and plan.

## 2013-05-06 ENCOUNTER — Encounter (INDEPENDENT_AMBULATORY_CARE_PROVIDER_SITE_OTHER): Payer: Self-pay

## 2013-05-06 ENCOUNTER — Other Ambulatory Visit: Payer: Medicare Other

## 2013-05-06 ENCOUNTER — Encounter (INDEPENDENT_AMBULATORY_CARE_PROVIDER_SITE_OTHER): Payer: Medicare Other | Admitting: Surgery

## 2013-05-07 ENCOUNTER — Encounter: Payer: Self-pay | Admitting: Oncology

## 2013-05-07 ENCOUNTER — Other Ambulatory Visit: Payer: Self-pay | Admitting: Emergency Medicine

## 2013-05-07 ENCOUNTER — Encounter: Payer: Self-pay | Admitting: *Deleted

## 2013-05-07 DIAGNOSIS — C50412 Malignant neoplasm of upper-outer quadrant of left female breast: Secondary | ICD-10-CM

## 2013-05-07 MED ORDER — PROCHLORPERAZINE MALEATE 10 MG PO TABS: 10.0000 mg | ORAL_TABLET | Freq: Four times a day (QID) | ORAL | Status: DC | PRN

## 2013-05-07 MED ORDER — DEXAMETHASONE 4 MG PO TABS: ORAL_TABLET | ORAL | Status: DC

## 2013-05-07 MED ORDER — ONDANSETRON HCL 8 MG PO TABS: ORAL_TABLET | ORAL | Status: DC

## 2013-05-07 MED ORDER — LORAZEPAM 0.5 MG PO TABS: ORAL_TABLET | ORAL | Status: DC

## 2013-05-07 MED ORDER — LIDOCAINE-PRILOCAINE 2.5-2.5 % EX CREA: TOPICAL_CREAM | CUTANEOUS | Status: DC | PRN

## 2013-05-07 NOTE — Progress Notes (Signed)
RECEIVED A FAX FROM [facility] CONCERNING A PRIOR AUTHORIZATION FOR LORAZEPAM. THIS REQUEST WAS GIVEN TO MANAGED CARE.

## 2013-05-07 NOTE — Progress Notes (Signed)
[facility], [id number], approved lorazepam 0.5mg  from 05/07/13-07/17/14.

## 2013-05-09 ENCOUNTER — Other Ambulatory Visit: Payer: Self-pay | Admitting: Emergency Medicine

## 2013-05-09 ENCOUNTER — Telehealth: Payer: Self-pay | Admitting: *Deleted

## 2013-05-09 DIAGNOSIS — C50412 Malignant neoplasm of upper-outer quadrant of left female breast: Secondary | ICD-10-CM

## 2013-05-09 NOTE — Progress Notes (Signed)
[facility] Psychosocial Distress Screening Clinical Social Work  Patient stopped by to see Clinical Warden/ranger after appointment with financial advocate.  Patient provided follow up on her past surgery and how she was feeling.  Patient stated that she was currently considering several options for housing, both of which seem to be satisfactory to her. Clinical Social Worker Intern inquired as to whether Patient had a chance to discuss feelings with family and friends.  Patient stated that she did and was glad to have had the conversation.  Patient stated she was doing okay and feeling good.  Clinical Social Worker Intern validated the steps Patient has taken towards positive action.   Clinical Social Worker follow up needed: no  If yes, follow up plan:   Kathy Maxwell S. [facility] Clinical Social Work Intern [employer] [facility] [phone]

## 2013-05-09 NOTE — Telephone Encounter (Signed)
Per staff message and POF I have scheduled appts.  JMW  

## 2013-05-10 ENCOUNTER — Ambulatory Visit (HOSPITAL_BASED_OUTPATIENT_CLINIC_OR_DEPARTMENT_OTHER): Payer: Medicare Other | Admitting: Oncology

## 2013-05-10 ENCOUNTER — Ambulatory Visit: Payer: Medicare Other

## 2013-05-10 ENCOUNTER — Encounter: Payer: Self-pay | Admitting: Oncology

## 2013-05-10 ENCOUNTER — Telehealth: Payer: Self-pay | Admitting: *Deleted

## 2013-05-10 ENCOUNTER — Other Ambulatory Visit (HOSPITAL_BASED_OUTPATIENT_CLINIC_OR_DEPARTMENT_OTHER): Payer: Medicare Other | Admitting: Lab

## 2013-05-10 VITALS — BP 153/76 | HR 71 | Temp 98.0°F | Resp 18 | Ht 62.0 in | Wt 206.2 lb

## 2013-05-10 DIAGNOSIS — C50412 Malignant neoplasm of upper-outer quadrant of left female breast: Secondary | ICD-10-CM

## 2013-05-10 DIAGNOSIS — C773 Secondary and unspecified malignant neoplasm of axilla and upper limb lymph nodes: Secondary | ICD-10-CM

## 2013-05-10 DIAGNOSIS — C50919 Malignant neoplasm of unspecified site of unspecified female breast: Secondary | ICD-10-CM

## 2013-05-10 DIAGNOSIS — Z170 Estrogen receptor positive status [ER+]: Secondary | ICD-10-CM

## 2013-05-10 LAB — CBC WITH DIFFERENTIAL/PLATELET
BASO%: 0.6 % (ref 0.0–2.0)
HCT: 32.9 % — ABNORMAL LOW (ref 34.8–46.6)
MCHC: 31.9 g/dL (ref 31.5–36.0)
MONO#: 0.6 10*3/uL (ref 0.1–0.9)
MONO%: 8.6 % (ref 0.0–14.0)
RBC: 3.52 10*6/uL — ABNORMAL LOW (ref 3.70–5.45)
WBC: 7.2 10*3/uL (ref 3.9–10.3)
lymph#: 2.8 10*3/uL (ref 0.9–3.3)
nRBC: 0 % (ref 0–0)

## 2013-05-10 NOTE — Telephone Encounter (Signed)
error 

## 2013-05-10 NOTE — Telephone Encounter (Signed)
Per staff message and POF I have scheduled appts.  JMW  

## 2013-05-10 NOTE — Patient Instructions (Signed)
  We will see you back on 11/7 for follow up and chemotherapy

## 2013-05-10 NOTE — Telephone Encounter (Deleted)
ERROR

## 2013-05-10 NOTE — Telephone Encounter (Signed)
appts made and printed. Pt is aware that tx's will be added. i emailed MW to add the tx's...td 

## 2013-05-11 ENCOUNTER — Ambulatory Visit: Payer: Medicare Other

## 2013-05-12 NOTE — Progress Notes (Signed)
Kathy Maxwell [id number] 31-May-1956 57 y.o. 05/12/2013 1:15 PM  CC  Gwynneth Aliment, MD [address] [place] [address] Dr. 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 Patient is seen in medical oncology for discussion of treatment options  STAGE:   No matching staging information was found for the patient.  REFERRING PHYSICIAN: Dr. Claud Kelp  HISTORY OF PRESENT ILLNESS:  Kathy Maxwell is a 57 y.o. female.    #1With other medical problems who underwent a screening mammogram that led to a diagnostic mammogram and ultrasound of the left breast. The mammogram and ultrasound showed a 2.9 x 1.6 x 1.9 cm suspicious mass in the left breast at the 12:30 oh clock position 6 cm from the nipple. There was also a suspicious left axillary lymph node. She had image guided biopsy of the left breast mass and the lymph node. The pathology revealed an invasive mammary carcinoma probably ductal phenotype. Tumor was estrogen receptor +100% progesterone receptor +98% proliferation marker Ki-67 13% and HER-2/neu negative. Patient had MRI of the breasts performed on 03/15/2013 the left breast revealed a 7.4 cm area of what the lymph node. Right breast revealed and a 3.3 cm mass.   #2 the patient is now status post left mastectomy with right lumpectomy on 04/22/2013. The final pathology revealed the following: 1. Breast, modified radical mastectomy , left - INVASIVE DUCTAL CARCINOMA, MULTIPLE FOCI, GRADE I/III, THE LARGEST SPANS 5.5 CM. - DUCTAL CARCINOMA IN SITU, LOW GRADE. - METASTATIC CARCINOMA IN 4 OF 22 LYMPH NODES (4/22), WITH EXTRACAPSULAR EXTENSION. - THE SURGICAL RESECTION MARGINS ARE NEGATIVE FOR CARCINOMA. - SEE ONCOLOGY TABLE BELOW. 2. Breast, excision, Right, re-excision medial margin - DUCTAL CARCINOMA IN SITU, INTERMEDIATE GRADE, SPANNING 0.8 CM. - DUCTAL CARCINOMA IN SITU IS  FOCALLY LESS THAN 0.1 CM FROM THE NEW MEDIAL MARGIN OF SPECIMEN # 2. - LOBULAR NEOPLASIA (ATYPICAL LOBULAR HYPERPLASIA). 1 of 5 FINAL for Kathy Maxwell ([id number]) Diagnosis(continued) 3. Breast, lumpectomy, Right - DUCTAL CARCINOMA IN SITU WITH CALCIFICATIONS, INTERMEDIATE TO HIGH GRADE, SPANNING AT LEAST 3.0 CM. - DUCTAL CARCINOMA IN SITU IS BROADLY LESS THAN 0.1 CM FROM THE MEDIAL MARGIN OF SPECIMEN #3. - SEE ONCOLOGY TABLE BELOW. 4. Lymph node, sentinel, biopsy, Right, #1 - THERE IS NO EVIDENCE OF CARCINOMA IN 1 OF 1 LYMPH NODE (0/1). 5. Lymph node, sentinel, biopsy, Right #2 - THERE IS NO EVIDENCE OF CARCINOMA IN 1 OF 1 LYMPH NODE (0/1). 6. Lymph node, sentinel, biopsy, Right #3 - THERE IS NO EVIDENCE OF CARCINOMA IN 1 OF 1 LYMPH NODE (0/1). 7. Lymph node, sentinel, biopsy, Right #4 - THERE IS NO EVIDENCE OF CARCINOMA IN 1 OF 1 LYMPH NODE (0/1). Microscopic Comment  #3 patient is recommended adjuvant chemotherapy consisting of FEC given dose dense for total of 6 cycles followed by weekly Taxol for 12 weeks. We discussed risks benefits and side effects. Her chemotherapy will begin on 05/24/2013.  Interval History: patient return in followup today after having her definitive surgery. She and I discussed her final pathology in detail. We also discussed the need for adjuvant chemotherapy followed by adjuvant radiation therapy and then antiestrogen therapy for a total of 5-10 years. We discussed details of the chemotherapy. We discussed the type of chemotherapy she will require. She certainly will receive a anthracycline and there for she will need echocardiogram. Today patient denies any nausea vomiting fevers chills she does  have some soreness at the surgical sites. She has no myalgias and arthralgias. Remainder of the 10 point review of systems is negative.   Current Therapy: patient will begin adjuvant chemotherapy consisting of FEC starting on 05/24/2013   Past Medical  History: Past Medical History  Diagnosis Date  . Hyperlipidemia   . Hypertension   . Bipolar 1 disorder   . Cancer   . Hypertension 04/04/2013  . Hyperlipidemia 04/04/2013  . Bipolar 1 disorder 04/04/2013  . Breast cancer 03/10/13    left breast invasive ca  . Allergy     latex  . Obesity   . Diabetes mellitus without complication     NIDDM  . Sleep apnea     no cpap due to insurance  . Heart murmur   . GERD (gastroesophageal reflux disease)   . Coronary artery disease    Past Surgical History: Past Surgical History  Procedure Laterality Date  . Cholecystectomy    . Breast biopsy Left 05/08/09    fibroadenoma with microcalcifications,no malignancy id  . Breast biopsy Left 03/07/13    invasive ca,1 lymph node metastatic  . 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 Abdominal hysterectomy    . Coronary stent placement  2001  . Cardiac catheterization    . Eye surgery  6/13,9/14    laser,cataract lft  . Coronary angioplasty      LAD stent 2003  . Portacath placement Right 04/22/2013    Procedure: INSERTION PORT-A-CATH;  Surgeon: [name], MD;  [facility];  Service: General;  Laterality: Right;  . Mastectomy modified radical Left 04/22/2013    Procedure: MASTECTOMY MODIFIED RADICAL;  Surgeon: [name], MD;  Location: [facility] OR;  Service: General;  Laterality: Left;  . Breast lumpectomy with needle localization and axillary sentinel lymph node bx Right 04/22/2013    Procedure: BREAST LUMPECTOMY WITH NEEDLE LOCALIZATION AND AXILLARY SENTINEL LYMPH NODE BX;  Surgeon: [name], MD;  Location: [facility] OR;  Service: General;  Laterality: Right;    Family History: Family History  Problem Relation Age of Onset  . Heart disease Mother   . Cancer Maternal Aunt 69    breast cancer  . Cancer Cousin 22    breast cancer    Social History History  Substance Use Topics  . Smoking status: Former Smoker -- 1.00  packs/day for 15 years    Types: Cigarettes    Quit date: 07/19/1987  . Smokeless tobacco: Never Used  . Alcohol Use: No    Allergies: Allergies  Allergen Reactions  . Adhesive [Tape] Rash    Clear tape gloves ,elastic no problem    Current Medications: Current Outpatient Prescriptions  Medication Sig Dispense Refill  . amLODipine (NORVASC) 10 MG tablet Take 10 mg by mouth daily.      . Azilsartan-Chlorthalidone 40-25 MG TABS Take 1 tablet by mouth daily.       [name] buPROPion (WELLBUTRIN SR) 150 MG 12 hr tablet Take 150 mg by mouth daily.       [name] dexamethasone (DECADRON) 4 MG tablet Take 2 tablets by mouth once a day on the day after chemotherapy and then take 2 tablets two times a day for 2 days. Take with food.  30 tablet  1  . hydrALAZINE (APRESOLINE) 25 MG tablet Take 25 mg by mouth 2 (two) times daily.      [name] HYDROcodone-acetaminophen (NORCO/VICODIN) 5-325 MG per tablet Take 1-2 tablets by mouth  every 4 (four) hours as needed for pain.  30 tablet  0  . lidocaine-prilocaine (EMLA) cream Apply topically as needed.  30 g  0  . LORazepam (ATIVAN) 0.5 MG tablet Take 1 tablet (0.5mg ) every 6 hours as needed for nausea or vomiting.  30 tablet  0  . losartan (COZAAR) 100 MG tablet Take 100 mg by mouth daily.       . metoprolol succinate (TOPROL-XL) 25 MG 24 hr tablet Take 25 mg by mouth daily.      . ondansetron (ZOFRAN) 8 MG tablet Take 1 tablet (8mg s) every 12 hours as needed for nausea or vomiting starting the 3rd day after chemotherapy.  30 tablet  1  . pantoprazole (PROTONIX) 40 MG tablet Take 40 mg by mouth daily.      . pioglitazone-metformin (ACTOPLUS MET) 15-850 MG per tablet Take 1 tablet by mouth 2 (two) times daily with a meal.      . prochlorperazine (COMPAZINE) 10 MG tablet Take 1 tablet (10 mg total) by mouth every 6 (six) hours as needed (Nausea or vomiting).  30 tablet  1  . rosuvastatin (CRESTOR) 5 MG tablet Take 5 mg by mouth daily.      . simvastatin (ZOCOR) 40 MG tablet  Take 40 mg by mouth every evening.      . sitaGLIPtin (JANUVIA) 100 MG tablet Take 100 mg by mouth daily.      Demetra [employer] (EASY TOUCH LANCING DEVICE) MISC       . LITE TOUCH LANCETS MISC       . ONE TOUCH ULTRA TEST test strip        No current facility-administered medications for this visit.    OB/GYN History:menarche at 8, menopause 1997 surgical , no, no children, G2P0  Fertility Discussion: n/a Prior History of Cancer: no  Health Maintenance:  Colonoscopy no Bone Density no Last PAP smear 1996  ECOG PERFORMANCE STATUS: 0 - Asymptomatic  Genetic Counseling/testing:yes  REVIEW OF SYSTEMS:  Patient does complain of having insomnia as well as irregular heartbeat fainting spells depression and bipolar disorder diabetes and hot flashes. Remainder of the 14 point review of systems is unremarkable.  PHYSICAL EXAMINATION: Blood pressure 153/76, pulse 71, temperature 98 F (36.7 C), temperature source Oral, resp. rate 18, height 5\' 2"  (1.575 m), weight 206 lb 3.2 oz (93.532 kg).  WUJ:WJXBJ, healthy, no distress, well nourished and well developed SKIN: skin color, texture, turgor are normal HEAD: Normocephalic EYES: PERRLA, EOMI, Conjunctiva are pink and non-injected EARS: External ears normal OROPHARYNX:no exudate, no erythema and lips, buccal mucosa, and tongue normal  NECK: supple, no adenopathy, thyroid normal size, non-tender, without nodularity LYMPH:  no palpable lymphadenopathy, no hepatosplenomegaly BREAST:right breast normal without mass, skin or nipple changes or axillary nodes, abnormal mass palpable in the right breast as well as a palpable lymph node LUNGS: clear to auscultation and percussion HEART: regular rate & rhythm ABDOMEN:abdomen soft, non-tender, obese, normal bowel sounds and no masses or organomegaly BACK: Back symmetric, no curvature., No CVA tenderness EXTREMITIES:no edema, no clubbing, no cyanosis  NEURO: alert & oriented x 3 with fluent  speech, no focal motor/sensory deficits, gait normal, reflexes normal and symmetric     STUDIES/RESULTS: US Breast Bilateral  03/28/2013   *RADIOLOGY REPORT*  Clinical Data:  Patient with recent diagnosis of left breast cancer status post recent bilateral breast MRI.  The patient presents for evaluation of the left breast to help define the extent of disease as  well as for second look ultrasound of the right breast to attempt to locate/biopsy the area of non mass enhancement within the right breast on prior MRI.  Additionally there were questioned lymph nodes within the right axilla.  BILATERAL BREAST ULTRASOUND  Comparison:  Breast MRI 03/15/2013  On physical exam, no definite discrete masses are able to be palpated within either breast.  Findings:  Ultrasound is performed of the left breast and the previously identified/biopsied irregular shadowing mass was able to be identified.  The patient has dense parenchymal tissue with areas of posterior acoustic scattering scattered throughout the breast making visualization of any discrete additional masses identified on prior MRI difficult.  Ultrasound evaluation of the right breast did not reveal any correlate for the described non mass enhancement centrally.  Evaluation of the right axilla demonstrated a few lymph nodes without significantly thickened cortices.  IMPRESSION: 1. Previously biopsied mass within the left breast is able to be identified with ultrasound. Additional areas identified on the recent MRI are not able to be discretely identified likely secondary to diffuse shadowing parenchymal tissue throughout the left breast.  If true evaluation of the extent of disease is desired for possible breast conservation therapy, the most accurate assessment would be MRI guided biopsy of the anterior most aspect of the abnormal enhancement within the left breast.  2.  No definite ultrasound correlate was identified within the right breast to correspond with non  mass enhancement identified on recent prior MRI.  Recommend right breast MRI guided biopsy for further evaluation.  RECOMMENDATION: Bilateral MRI guided core needle biopsies.  These have been scheduled for 04/03/2013 at 7:15 a.m.  I have discussed the findings and recommendations with the patient. Results were also provided in writing at the conclusion of the visit.  If applicable, a reminder letter will be sent to the patient regarding the next appointment.  BI-RADS CATEGORY 4:  Suspicious abnormality - biopsy should be considered.   Original Report Authenticated By: Annia Belt, M.D   Mr Breast Bilateral W Wo Contrast  03/15/2013   **ADDENDUM** CREATED: 03/15/2013 14:02:33  I discussed this case with Dr. Derrell Lolling over the phone.  In light of the fact that the patient has bipolar disorder, Dr. Derrell Lolling would like [name] to discuss the options of mastectomy versus lumpectomy of the left breast with the patient returns for second look ultrasound and possible biopsy.   Addended by:  Sherian Rein, M.D. on 03/15/2013 14:02:33.  **END ADDENDUM** SIGNED BY: Sherian Rein, M.D.  03/15/2013   *RADIOLOGY REPORT*  Clinical Data:  Recent diagnosis of left breast cancer.  The patient has had prior benign biopsies of both breasts.  The patient has a bipolar disorder.  MRI BILATERAL BREAST WITHOUT AND WITH CONTRAST  Technique: Multiplanar, multisequence MR images of the right breast were obtained prior to and following the intravenous administration of 14ml of multi hance.  Labs were obtained at [facility].  BUN is 18. Creatinine is 1.0  Comparison:  Recent imaging examinations.  FINDINGS:  Breast composition:  c:  Heterogeneous fibroglandular tissue  Background parenchymal enhancement:  Mild  Left breast:  There are at least four focus of abnormal spiculated enhancement demonstrating washout kinetics in the upper left breast centrally.   The entirety area measures 7.4 cm transverse x 6.6 cm anterior-posterior x 5.1 cm  cranial-caudal. The recently biopsy focus is posteriorly located measuring 2.1 x 1 x 2.1 cm.  The anterior focus that was not recently biopsies measures  3.1 x 2.6 x 2.6 cm.  There is a lateral focus not recently biopsied measuring 1.1 x 1.4 x 1.6 cm.  There is a small medial focus measuring cysts 0.7 x 0.8 x 0.7 cm.  Post biopsy changes are identified in the left axilla.  There are abnormal enlarged lymph nodes correlating to the patient's known metastatic neoplasm to the lymph nodes with the largest lymph node measuring 1.8 x 0.8 cm.  Right breast:   There is an irregular focus of 3.3 x 3.1 x 1.3 cm abnormal enhancement in the central right breast in the middle one third demonstrating washout kinetics.  There are mild thickened cortex and prominent lymph nodes within the right axilla, largest measures 1.6 x 1 cm.  There is a posterior central upper breast focus with associated clip artifact measuring 1.2 x 2.2 x 1 cm previously biopsied to be benign.  IMPRESSION:  Impression: Suspicious findings in bilateral breasts.  1. Several abnormal enhancing foci encompassing 7.4 x 6.6 x 5.1 cm area of the upper left breast. The posterior focus was most recently biopsy to be cancer.   2. Abnormal enhancing focus in the central middle one third of right breast and mild prominent right axillary lymph nodes.  RECOMMENDATION: 1. If the patient is interested left breast conservation, then a second look ultrasound with biopsy of the most anterior focus is recommended to assess the extent of disease in the left breast.  2.  Recommend a second look ultrasound with biopsy of the right breast mass and right axillary lymph node.  If the area within the right breast cannot be found on ultrasound, an MR guided core biopsy is recommended.  BI-RADS CATEGORY 5:  Highly suggestive of malignancy - appropriate action should be taken.  THREE-DIMENSIONAL MR IMAGE RENDERING ON INDEPENDENT WORKSTATION:  Three-dimensional MR images were rendered by  post-processing of the original MR data on a DynaCad workstation.  The three-dimensional MR images were interpreted, and findings were reported in the accompanying complete MRI report for this study.   Original Report Authenticated By: Sherian Rein, M.D.   Mm Digital Diagnostic Bilat  04/03/2013   *RADIOLOGY REPORT*  Clinical Data:  Known left breast cancer.  Abnormal enhancement seen in both breast with MR imaging.  DIGITAL DIAGNOSTIC BILATERAL MAMMOGRAM  Comparison:  Previous exams.  Findings:  Mammographic images were obtained following MR guided core biopsies of both breast. A cylindrical shaped clip is seen in the central portion of the right breast.  A cylindrical clip is seen in the anterior aspect of the left breast.  The clip is located 6.4 cm anterior inferior to the ribbon shaped clip.  IMPRESSION: Status post MR guided core biopsies of both breast with pathology pending.  Final Assessment:  Post Procedure Mammograms for Marker Placement   Original Report Authenticated By: Baird Lyons, M.D.   Mm Digital Diagnostic Unilat L  03/07/2013   *RADIOLOGY REPORT*  Clinical Data: Patient is post ultrasound guided core biopsy of a 2.9 cm mass over the 12:30 position of the left breast as well as biopsy of a borderline abnormal left axillary lymph node.  ULTRASOUND GUIDED POST-BIOPSY CLIP PLACEMENT LEFT  Comparison:  Previous exams.  Findings: Exam demonstrates a ribbon shaped metallic clip along the posterior edge of patient's biopsied mass at the 12:30 position of the left breast.  IMPRESSION: Satisfactory clip placement post ultrasound guided core biopsy left breast.   Original Report Authenticated By: Elberta Fortis, M.D.   [name] Lt Breast Bx W  Loc Dev 1st Lesion Img Bx Spec US Guide  03/08/2013   **ADDENDUM** CREATED: 03/08/2013 14:11:52  I spoke with the patient by telephone on March 08, 2013 to discuss pathology results.  Pathology demonstrates invasive carcinoma at the biopsy site in the left breast as  well as metastatic disease within the biopsied left axillary lymph node.  The patient reports no problems at the biopsy site.  All questions were answered.  Recommendations:  Surgical consultation is recommended and has been scheduled for March 15, 2013 at 11:15 a.m. with Dr. Derrell Lolling.  Additionally the patient is scheduled to have a breast MRI on March 15, 2013 at 8:30 a.m.  **END ADDENDUM** SIGNED BY: Antonieta Loveless, M.D  03/07/2013   *RADIOLOGY REPORT*  Clinical Data:  Patient presents for ultrasound-guided core biopsy of a suspicious 2.9 cm mass at the 12:30 position of the left breast as well as a borderline left axillary lymph node.  ULTRASOUND GUIDED VACUUM ASSISTED CORE BIOPSY OF THE LEFT BREAST  Comparison: Previous exams.  I met with the patient and we discussed the procedure of ultrasound- guided biopsy, including benefits and alternatives.  We discussed the high likelihood of a successful procedure. We discussed the risks of the procedure including infection, bleeding, tissue injury, clip migration, and inadequate sampling.  Informed written consent was given. The usual time-out protocol was performed immediately prior to the procedure.  Using sterile technique and 2% Lidocaine as local anesthetic, under direct ultrasound visualization, a 13 gauge vacuum-assisted device was used to perform biopsy of the targeted 2.9 cm mass at the 12:30 position using a lateral to medial approach. At the conclusion of the procedure, a ribbon shaped tissue marker clip was deployed into the biopsy cavity.  Follow-up 2-view mammogram was performed and dictated separately.  I met with the patient and we discussed the procedure of ultrasound- guided biopsy, including benefits and alternatives.  We discussed the high likelihood of a successful procedure. We discussed the risks of the procedure including infection, bleeding, tissue injury, clip migration, and inadequate sampling.  Informed written consent was given. The usual  time-out protocol was performed immediately prior to the procedure.  Using sterile technique and 2% Lidocaine as local anesthetic, under direct ultrasound visualization, a 13 gauge vacuum-assisted device was used to perform biopsy of the targeted borderline abnormal left axillary lymph node using a lateral to medial approach.  No clip was placed into the lymph node.  The patient tolerated the procedure without complications.  IMPRESSION: Ultrasound-guided biopsy of the a suspicious 2.9 cm mass at the 12:30 position of the left breast as well as a borderline abnormal left axillary lymph node.  Original Report Authenticated By: Elberta Fortis, M.D.   [name] Lt Breast Bx W Loc Dev Ea Add Lesion Img Bx Spec US Guide  03/08/2013   **ADDENDUM** CREATED: 03/08/2013 14:11:52  I spoke with the patient by telephone on March 08, 2013 to discuss pathology results.  Pathology demonstrates invasive carcinoma at the biopsy site in the left breast as well as metastatic disease within the biopsied left axillary lymph node.  The patient reports no problems at the biopsy site.  All questions were answered.  Recommendations:  Surgical consultation is recommended and has been scheduled for March 15, 2013 at 11:15 a.m. with Dr. Derrell Lolling.  Additionally the patient is scheduled to have a breast MRI on March 15, 2013 at 8:30 a.m.  **END ADDENDUM** SIGNED BY: Antonieta Loveless, M.D  03/07/2013   *RADIOLOGY REPORT*  Clinical  Data:  Patient presents for ultrasound-guided core biopsy of a suspicious 2.9 cm mass at the 12:30 position of the left breast as well as a borderline left axillary lymph node.  ULTRASOUND GUIDED VACUUM ASSISTED CORE BIOPSY OF THE LEFT BREAST  Comparison: Previous exams.  I met with the patient and we discussed the procedure of ultrasound- guided biopsy, including benefits and alternatives.  We discussed the high likelihood of a successful procedure. We discussed the risks of the procedure including infection, bleeding, tissue  injury, clip migration, and inadequate sampling.  Informed written consent was given. The usual time-out protocol was performed immediately prior to the procedure.  Using sterile technique and 2% Lidocaine as local anesthetic, under direct ultrasound visualization, a 13 gauge vacuum-assisted device was used to perform biopsy of the targeted 2.9 cm mass at the 12:30 position using a lateral to medial approach. At the conclusion of the procedure, a ribbon shaped tissue marker clip was deployed into the biopsy cavity.  Follow-up 2-view mammogram was performed and dictated separately.  I met with the patient and we discussed the procedure of ultrasound- guided biopsy, including benefits and alternatives.  We discussed the high likelihood of a successful procedure. We discussed the risks of the procedure including infection, bleeding, tissue injury, clip migration, and inadequate sampling.  Informed written consent was given. The usual time-out protocol was performed immediately prior to the procedure.  Using sterile technique and 2% Lidocaine as local anesthetic, under direct ultrasound visualization, a 13 gauge vacuum-assisted device was used to perform biopsy of the targeted borderline abnormal left axillary lymph node using a lateral to medial approach.  No clip was placed into the lymph node.  The patient tolerated the procedure without complications.  IMPRESSION: Ultrasound-guided biopsy of the a suspicious 2.9 cm mass at the 12:30 position of the left breast as well as a borderline abnormal left axillary lymph node.  Original Report Authenticated By: Elberta Fortis, M.D.   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  This is concordant.  The patient has an appointment with oncology today and with Dr. Derrell Lolling on 04/08/13.  The patient and I discussed these results and  appointments by phone today at 11:00.  She stated she was doing fine after the biopsy.  **END ADDENDUM** SIGNED BY: Esperanza Heir, M.D.  04/03/2013   *RADIOLOGY REPORT*  Clinical Data:  Known left breast cancer.  Abnormal MRI.  Evaluate extent of disease.  MRI GUIDED VACUUM ASSISTED BIOPSY OF THE LEFT BREAST WITHOUT AND WITH CONTRAST  Comparison: Previous exams.  Technique: Multiplanar, multisequence MR images of both breast were obtained prior to and following the intravenous administration of 19 ml of Mulithance.  I met with the patient, and we discussed the procedure of MRI guided biopsy, including risks, benefits, and alternatives. Specifically, we discussed the risks of infection, bleeding, tissue injury, clip migration, and inadequate sampling.  Informed, written consent was given.  Using sterile technique, 2% Lidocaine, MRI guidance, and a 9 gauge vacuum assisted device, biopsy was performed of abnormal enhancement in the anterior aspect of the left breast using a lung approach.  At the conclusion of the procedure, a cylindrical show tissue marker clip was deployed into the biopsy cavity.  IMPRESSION: MRI guided biopsy of the left breast. No apparent complications.  THREE-DIMENSIONAL MR IMAGE RENDERING ON INDEPENDENT WORKSTATION:  Three-dimensional  MR images were rendered by post-processing of the original MR data on a DynaCad workstation.  The three-dimensional MR images were interpreted, and findings were reported in the accompanying complete MRI report for this study.   Original Report Authenticated By: Baird Lyons, M.D.   Mr Rt Breast Bx Jones Bales Dev 1st Lesion Image Bx Spec Mr Guide  04/03/2013   *RADIOLOGY REPORT*  Clinical Data:  Known left breast cancer.  Abnormal enhancement seen in the right breast.  MRI GUIDED VACUUM ASSISTED BIOPSY OF THE RIGHT BREAST WITHOUT AND WITH CONTRAST  Comparison: Previous exams.  Technique: Multiplanar, multisequence MR images of both breast were obtained prior to and  following the intravenous administration of 19 ml of Mulithance.  I met with the patient, and we discussed the procedure of MRI guided biopsy, including risks, benefits, and alternatives. Specifically, we discussed the risks of infection, bleeding, tissue injury, clip migration, and inadequate sampling.  Informed, written consent was given.  Using sterile technique, 2% Lidocaine, MRI guidance, and a 9 gauge vacuum assisted device, biopsy was performed of abnormal enhancement in the central portion of the right breast using a lateral approach.  At the conclusion of the procedure, a cylindrical shaped tissue marker clip was deployed into the biopsy cavity.  IMPRESSION: MRI guided biopsy of the right breast. No apparent complications.  THREE-DIMENSIONAL MR IMAGE RENDERING ON INDEPENDENT WORKSTATION:  Three-dimensional MR images were rendered by post-processing of the original MR data on a DynaCad workstation.  The three-dimensional MR images were interpreted, and findings were reported in the accompanying complete MRI report for this study.   Original Report Authenticated By: Baird Lyons, M.D.     LABS:    Chemistry      Component Value Date/Time   NA 134* 04/24/2013 0550   NA 140 04/04/2013 1513   K 3.8 04/24/2013 0550   K 4.6 04/04/2013 1513   CL 101 04/24/2013 0550   CO2 25 04/24/2013 0550   CO2 29 04/04/2013 1513   BUN 19 04/24/2013 0550   BUN 24.8 04/04/2013 1513   CREATININE 0.98 04/24/2013 0550   CREATININE 1.3* 04/04/2013 1513      Component Value Date/Time   CALCIUM 8.3* 04/24/2013 0550   CALCIUM 9.7 04/04/2013 1513   ALKPHOS 93 04/04/2013 1513   AST 15 04/04/2013 1513   ALT 11 04/04/2013 1513   BILITOT 0.33 04/04/2013 1513      Lab Results  Component Value Date   WBC 7.2 05/10/2013   HGB 10.5* 05/10/2013   HCT 32.9* 05/10/2013   MCV 93.5 05/10/2013   PLT 387 05/10/2013   PATHOLOGY: ADDITIONAL INFORMATION: 1. CHROMOGENIC IN-SITU HYBRIDIZATION Results: HER-2/NEU BY CISH - NO  AMPLIFICATION OF HER-2 DETECTED. RESULT RATIO OF HER2: CEP 17 SIGNALS 0.78 AVERAGE HER2 COPY NUMBER PER CELL 2.10 REFERENCE RANGE NEGATIVE HER2/Chr17 Ratio <2.0 and Average HER2 copy number <4.0 EQUIVOCAL HER2/Chr17 Ratio <2.0 and Average HER2 copy number 4.0 and <6.0 POSITIVE HER2/Chr17 Ratio >=2.0 and/or Average HER2 copy number >=6.0 Pecola Leisure MD Pathologist, Electronic Signature ( Signed 05/01/2013) FINAL DIAGNOSIS Diagnosis 1. Breast, modified radical mastectomy , left - INVASIVE DUCTAL CARCINOMA, MULTIPLE FOCI, GRADE I/III, THE LARGEST SPANS 5.5 CM. - DUCTAL CARCINOMA IN SITU, LOW GRADE. - METASTATIC CARCINOMA IN 4 OF 22 LYMPH NODES (4/22), WITH EXTRACAPSULAR EXTENSION. - THE SURGICAL RESECTION MARGINS ARE NEGATIVE FOR CARCINOMA. - SEE ONCOLOGY TABLE BELOW. 2. Breast, excision, Right, re-excision medial margin - DUCTAL CARCINOMA IN SITU, INTERMEDIATE GRADE, SPANNING 0.8 CM. - DUCTAL  CARCINOMA IN SITU IS FOCALLY LESS THAN 0.1 CM FROM THE NEW MEDIAL MARGIN OF SPECIMEN # 2. - LOBULAR NEOPLASIA (ATYPICAL LOBULAR HYPERPLASIA). 1 of 5 FINAL for OLIE, SCAFFIDI ([id number]) Diagnosis(continued) 3. Breast, lumpectomy, Right - DUCTAL CARCINOMA IN SITU WITH CALCIFICATIONS, INTERMEDIATE TO HIGH GRADE, SPANNING AT LEAST 3.0 CM. - DUCTAL CARCINOMA IN SITU IS BROADLY LESS THAN 0.1 CM FROM THE MEDIAL MARGIN OF SPECIMEN #3. - SEE ONCOLOGY TABLE BELOW. 4. Lymph node, sentinel, biopsy, Right, #1 - THERE IS NO EVIDENCE OF CARCINOMA IN 1 OF 1 LYMPH NODE (0/1). 5. Lymph node, sentinel, biopsy, Right #2 - THERE IS NO EVIDENCE OF CARCINOMA IN 1 OF 1 LYMPH NODE (0/1). 6. Lymph node, sentinel, biopsy, Right #3 - THERE IS NO EVIDENCE OF CARCINOMA IN 1 OF 1 LYMPH NODE (0/1). 7. Lymph node, sentinel, biopsy, Right #4 - THERE IS NO EVIDENCE OF CARCINOMA IN 1 OF 1 LYMPH NODE (0/1). Microscopic Comment 1. BREAST, INVASIVE TUMOR, WITH LYMPH NODE SAMPLING (PART 1) Specimen, including laterality:  Left breast. Procedure: Modified radical mastectomy. Histologic type: Ductal Grade: I Tubule formation: 2 Nuclear pleomorphism: 1 Mitotic:1 Tumor size (gross measurement): The largest focus spans 5.5 cm and the smaller two foci span 2.8 cm and 1.8 cm Margins: Invasive, distance to closest margin: 2.0 cm to the posterior margin (gross measurement) In-situ, distance to closest margin: 2.0 cm to the posterior margin (gross measurement). Lymphovascular invasion: Not identified. Ductal carcinoma in situ: Present. Grade: Low grade Extensive intraductal component: No. Lobular neoplasia: Not present in specimen # 1. Tumor focality: Multifocal. Treatment effect: N/A Extent of tumor: Confined to breast parenchyma. Lymph nodes: Examined: 22 Lymph nodes with metastasis: 4 Isolated tumor cells (< 0.2 mm): 1 Micrometastasis: (> 0.2 mm and < 2.0 mm): 0 Macrometastasis: (> 2.0 mm): 3 Extracapsular extension: Present. Breast prognostic profile: [phone] Estrogen receptor: 100%, strong staining intensity. Progesterone receptor: 23%, strong staining intensity. Her 2 neu: No amplification was detected. Ki-67: 33%. Non-neoplastic breast: Healing biopsy site. Her2 will be repeated on the largest tumor in the current case and the results reported separately. TNM: mpT3, pN2a. 2 of 5 FINAL for JAREE, DWIGHT ([id number]) Microscopic Comment(continued) Comments: An E-cadherin stain performed on multiple blocks (1C, 21F and 1I), shows that the carcinoma cells are strongly positive for E-cadherin, supporting a ductal phenotype. (JBK:gt, 04/25/13) 3. BREAST, IN SITU CARCINOMA (PART 3) Specimen, including laterality: Right breast. Procedure (include lymph node sampling sentinel-non-sentinel: Needle localized lumpectomy. Grade of carcinoma: Intermediate to high grade. Necrosis: Present. Estimated tumor size: (gross measurement): At least 3.0 cm, see comment. Treatment effect: N/A Distance to  closest margin: Broadly less than 0.1 cm from the medial margin of specimen of # 3. Breast prognostic profile: Case [id number] Estrogen receptor: 100%, strong staining intensity. Progesterone receptor: 11%, strong staining intensity. Lymph nodes: Examined: 4 Sentinel 0 Non-sentinel 4 Total Lymph nodes with metastasis: 0 TNM: pTis, pN0 Comments: Although there is a 3.0 cm span of intermediate to high grade ductal carcinoma present in specimen # 3, the additional 0.8 cm focus of DCIS in specimen # 2 raises the possibility that carcinoma in situ spans a length greater than 3.0 cm. 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 On MRI patient was found to have right-sided breast cancer also. 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 #2 patient and I discussed all of her pathology is today. I do think that she is a good mastectomy candidate on the left side to 2 multiple foci of invasive disease as well as ductal carcinoma in situ and extent of disease. Certainly she will need a full axillary lymph node dissection. We discussed the possibility of her seeing a Engineer, petroleum for reconstruction purposes.  #2 patient is status post left modified radical mastectomy and right lumpectomy. Final pathology is as noted above.  #3 patient has been referred to genetic counseling with [employer].  #3 patient and I discussed treatment with chemotherapy. She will need FEC followed by Taxol.we discussed risks benefits and side effects of chemotherapy. My plan is to have the first treatment started on 05/24/2013.  Antiemetics were all sentinel or pharmacies. She has had echocardiogram and chemotherapy teaching class.     PLAN:    #1 patient will receive adjuvant chemotherapy consisting of initially FEC given dose dense for a total of 6 cycles.  #2 she does need to know her genetic testing.  #3 I will see her back on 05/24/2013 for cycle 1 of her treatment.    Thank you so much for allowing me to participate in the care of Bronson Ing. I will continue to follow up the patient with you and assist in her care.  All questions were answered. The patient knows to call the clinic with any problems, questions or concerns. We can certainly see the patient much sooner if necessary.  I spent 20 minutes counseling the patient face to face. The total time spent in the appointment was 25 minutes.  Drue Second, MD Medical/Oncology [facility] [phone] (beeper) [phone] (Office)

## 2013-05-16 ENCOUNTER — Encounter (INDEPENDENT_AMBULATORY_CARE_PROVIDER_SITE_OTHER): Payer: Self-pay | Admitting: General Surgery

## 2013-05-16 ENCOUNTER — Encounter (INDEPENDENT_AMBULATORY_CARE_PROVIDER_SITE_OTHER): Payer: Self-pay

## 2013-05-16 ENCOUNTER — Ambulatory Visit (INDEPENDENT_AMBULATORY_CARE_PROVIDER_SITE_OTHER): Payer: Medicare Other | Admitting: General Surgery

## 2013-05-16 VITALS — BP 132/78 | HR 72 | Temp 97.0°F | Resp 20 | Ht 62.0 in | Wt 208.0 lb

## 2013-05-16 DIAGNOSIS — C50419 Malignant neoplasm of upper-outer quadrant of unspecified female breast: Secondary | ICD-10-CM

## 2013-05-16 DIAGNOSIS — D0590 Unspecified type of carcinoma in situ of unspecified breast: Secondary | ICD-10-CM

## 2013-05-16 DIAGNOSIS — D0591 Unspecified type of carcinoma in situ of right breast: Secondary | ICD-10-CM

## 2013-05-16 DIAGNOSIS — C50412 Malignant neoplasm of upper-outer quadrant of left female breast: Secondary | ICD-10-CM

## 2013-05-16 NOTE — Progress Notes (Signed)
Patient ID: Kathy Maxwell, female   DOB: 20-Jun-1956, 57 y.o.   MRN: [id number]  History:  This patient underwent left modified radical mastectomy, right partial mastectomy with needle localization, reexcision of medial margin, right axillary sentinel node biopsy, insertion of Port-A-Cath on 04/22/2013.  The final pathology report shows invasive cancer on the left side, multifocal, largest focus by 5.5 cm, 4/22 lymph nodes positive. the initial lumpectomy specimen on the right shows ductal carcinoma in situ, with DCIS broadly less than 1 mm to the medial margin. I could tell that I was close from the specimen mammogram and so I reexcised the medial margin. Reexcision margins shows DCIS and it shows that now there is DCIS less than 1 mm to medial margin, but it is now only a focal area.  This was discussed in breast multidisciplinary conference last week, and the consensus opinion was that she does not need any further reexcision. Chemotherapy is planned as soon as the drain is removed... Her tumors are receptor positive, HER-2-negative.  She is still draining 30-40 cc from the remaining drain in the left mastectomy wound, skin flap drain.   Exam:  Patient looks well. No distress.  Chest: Port-A-Cath site looks fine. No hematoma or infection  Breasts the left mastectomy incision is healing normally. Some redundant adipose tissue, as expected but no infection, no seroma, no hematoma.The right lumpectomy incision right axillary incision looked normal The remaining drain was left in place.   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 Focally close medial margin ring margin following reexcision of margins. Recovering uneventfully following right partial mastectomy,  reexcision of medial margin, and right axillary sentinel node biopsy. Stage Tis, N0.  Bipolar disorder  Hypertension  Obesity  History TAH and BSO    Plan:  Return to see me or nurse in office in one week. The drain will need to be removed for chemotherapy can be started.[name]  Refer to physical therapy after remaining drain removed  Keep appointments with Dr. Drue Second and Dr. Michell Heinrich.     [name]. Derrell Lolling, M.D., [facility] General and Minimally invasive Surgery Breast and Colorectal Surgery Office:   [phone] Pager:   [phone]

## 2013-05-16 NOTE — Patient Instructions (Signed)
The right breast incisions are healing normally. The left mastectomy incision is healing normally. You are still draining too much to remove the drain.  Continue to move your shoulders around.  Return to see Dr. Derrell Lolling in one week for a drain check  You cannot start chemotherapy for 2 weeks.

## 2013-05-23 ENCOUNTER — Telehealth (INDEPENDENT_AMBULATORY_CARE_PROVIDER_SITE_OTHER): Payer: Self-pay

## 2013-05-23 ENCOUNTER — Other Ambulatory Visit: Payer: Self-pay | Admitting: Emergency Medicine

## 2013-05-23 ENCOUNTER — Encounter (INDEPENDENT_AMBULATORY_CARE_PROVIDER_SITE_OTHER): Payer: Medicare Other

## 2013-05-23 DIAGNOSIS — C50412 Malignant neoplasm of upper-outer quadrant of left female breast: Secondary | ICD-10-CM

## 2013-05-23 NOTE — Telephone Encounter (Signed)
LMOM for pt to call me. I want to check with her about her drain amount before her nurse only visit today at 2:20. I see on Sara's note for the nurse visit that the drain amount must be less than 15cc's in 48 hrs.

## 2013-05-23 NOTE — Telephone Encounter (Signed)
Pt called stating she has only had 15 cc drainage in 24 hours. She states this is the first day she has had this amt of drainage. Pt aware she needs to have below 15 cc for 48 hrs per appt note in epic. Drain removal appt moved to tomorrow. Pt will call if drainage is more than 15 cc.

## 2013-05-23 NOTE — Telephone Encounter (Signed)
Thank you :)

## 2013-05-24 ENCOUNTER — Other Ambulatory Visit (HOSPITAL_BASED_OUTPATIENT_CLINIC_OR_DEPARTMENT_OTHER): Payer: Medicare Other | Admitting: Lab

## 2013-05-24 ENCOUNTER — Encounter: Payer: Self-pay | Admitting: Adult Health

## 2013-05-24 ENCOUNTER — Ambulatory Visit (INDEPENDENT_AMBULATORY_CARE_PROVIDER_SITE_OTHER): Payer: Medicare Other

## 2013-05-24 ENCOUNTER — Ambulatory Visit (HOSPITAL_BASED_OUTPATIENT_CLINIC_OR_DEPARTMENT_OTHER): Payer: Medicare Other

## 2013-05-24 ENCOUNTER — Ambulatory Visit (HOSPITAL_BASED_OUTPATIENT_CLINIC_OR_DEPARTMENT_OTHER): Payer: Medicare Other | Admitting: Adult Health

## 2013-05-24 ENCOUNTER — Other Ambulatory Visit: Payer: Medicare Other | Admitting: Lab

## 2013-05-24 VITALS — BP 134/74 | HR 71 | Temp 98.9°F | Resp 18 | Ht 62.0 in | Wt 206.0 lb

## 2013-05-24 DIAGNOSIS — C773 Secondary and unspecified malignant neoplasm of axilla and upper limb lymph nodes: Secondary | ICD-10-CM

## 2013-05-24 DIAGNOSIS — Z4889 Encounter for other specified surgical aftercare: Secondary | ICD-10-CM

## 2013-05-24 DIAGNOSIS — C50919 Malignant neoplasm of unspecified site of unspecified female breast: Secondary | ICD-10-CM

## 2013-05-24 DIAGNOSIS — C50412 Malignant neoplasm of upper-outer quadrant of left female breast: Secondary | ICD-10-CM

## 2013-05-24 DIAGNOSIS — Z5111 Encounter for antineoplastic chemotherapy: Secondary | ICD-10-CM

## 2013-05-24 DIAGNOSIS — D0590 Unspecified type of carcinoma in situ of unspecified breast: Secondary | ICD-10-CM

## 2013-05-24 DIAGNOSIS — C50419 Malignant neoplasm of upper-outer quadrant of unspecified female breast: Secondary | ICD-10-CM

## 2013-05-24 DIAGNOSIS — Z4803 Encounter for change or removal of drains: Secondary | ICD-10-CM

## 2013-05-24 DIAGNOSIS — D0591 Unspecified type of carcinoma in situ of right breast: Secondary | ICD-10-CM

## 2013-05-24 DIAGNOSIS — Z170 Estrogen receptor positive status [ER+]: Secondary | ICD-10-CM

## 2013-05-24 LAB — COMPREHENSIVE METABOLIC PANEL (CC13)
AST: 13 U/L (ref 5–34)
Albumin: 3.6 g/dL (ref 3.5–5.0)
BUN: 22.2 mg/dL (ref 7.0–26.0)
Calcium: 10 mg/dL (ref 8.4–10.4)
Chloride: 105 mEq/L (ref 98–109)
Glucose: 104 mg/dl (ref 70–140)
Potassium: 4.1 mEq/L (ref 3.5–5.1)
Sodium: 139 mEq/L (ref 136–145)
Total Protein: 7.5 g/dL (ref 6.4–8.3)

## 2013-05-24 LAB — CBC WITH DIFFERENTIAL/PLATELET
BASO%: 0.3 % (ref 0.0–2.0)
Basophils Absolute: 0 10*3/uL (ref 0.0–0.1)
EOS%: 2.6 % (ref 0.0–7.0)
Eosinophils Absolute: 0.2 10*3/uL (ref 0.0–0.5)
HGB: 11.3 g/dL — ABNORMAL LOW (ref 11.6–15.9)
MCHC: 32 g/dL (ref 31.5–36.0)
MONO#: 0.5 10*3/uL (ref 0.1–0.9)
MONO%: 7 % (ref 0.0–14.0)
NEUT#: 3.9 10*3/uL (ref 1.5–6.5)
RDW: 13.5 % (ref 11.2–14.5)
WBC: 7.4 10*3/uL (ref 3.9–10.3)
lymph#: 2.7 10*3/uL (ref 0.9–3.3)

## 2013-05-24 MED ORDER — SODIUM CHLORIDE 0.9 % IV SOLN
150.0000 mg | Freq: Once | INTRAVENOUS | Status: AC
  Administered 2013-05-24: 150 mg via INTRAVENOUS
  Filled 2013-05-24: qty 5

## 2013-05-24 MED ORDER — SODIUM CHLORIDE 0.9 % IV SOLN
Freq: Once | INTRAVENOUS | Status: AC
  Administered 2013-05-24: 12:00:00 via INTRAVENOUS

## 2013-05-24 MED ORDER — PALONOSETRON HCL INJECTION 0.25 MG/5ML
0.2500 mg | Freq: Once | INTRAVENOUS | Status: AC
  Administered 2013-05-24: 0.25 mg via INTRAVENOUS

## 2013-05-24 MED ORDER — EPIRUBICIN HCL CHEMO IV INJECTION 200 MG/100ML
99.0000 mg/m2 | Freq: Once | INTRAVENOUS | Status: AC
  Administered 2013-05-24: 200 mg via INTRAVENOUS
  Filled 2013-05-24: qty 100

## 2013-05-24 MED ORDER — SODIUM CHLORIDE 0.9 % IJ SOLN
10.0000 mL | INTRAMUSCULAR | Status: AC | PRN
  Administered 2013-05-24: 10 mL via INTRAVENOUS
  Filled 2013-05-24: qty 10

## 2013-05-24 MED ORDER — SODIUM CHLORIDE 0.9 % IV SOLN
500.0000 mg/m2 | Freq: Once | INTRAVENOUS | Status: AC
  Administered 2013-05-24: 1020 mg via INTRAVENOUS
  Filled 2013-05-24: qty 51

## 2013-05-24 MED ORDER — FLUOROURACIL CHEMO INJECTION 2.5 GM/50ML
500.0000 mg/m2 | Freq: Once | INTRAVENOUS | Status: AC
  Administered 2013-05-24: 1000 mg via INTRAVENOUS
  Filled 2013-05-24: qty 20

## 2013-05-24 MED ORDER — DEXAMETHASONE SODIUM PHOSPHATE 20 MG/5ML IJ SOLN
INTRAMUSCULAR | Status: AC
  Filled 2013-05-24: qty 5

## 2013-05-24 MED ORDER — HEPARIN SOD (PORK) LOCK FLUSH 100 UNIT/ML IV SOLN
500.0000 [IU] | Freq: Once | INTRAVENOUS | Status: AC
  Administered 2013-05-24: 500 [IU] via INTRAVENOUS
  Filled 2013-05-24: qty 5

## 2013-05-24 MED ORDER — PALONOSETRON HCL INJECTION 0.25 MG/5ML
INTRAVENOUS | Status: AC
  Filled 2013-05-24: qty 5

## 2013-05-24 MED ORDER — DEXAMETHASONE SODIUM PHOSPHATE 20 MG/5ML IJ SOLN
12.0000 mg | Freq: Once | INTRAMUSCULAR | Status: AC
  Administered 2013-05-24: 12 mg via INTRAVENOUS

## 2013-05-24 NOTE — Progress Notes (Signed)
Kathy Maxwell [id number] Oct 24, 1955 57 y.o. 05/25/2013 10:48 AM  CC  Kathy Aliment, MD [address] [place] [address] Dr. 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 Patient is seen in medical oncology for discussion of treatment options  STAGE:   IIIA  REFERRING PHYSICIAN: Dr. Claud Kelp  HISTORY OF PRESENT ILLNESS:  Kathy Maxwell is a 58 y.o. female.    #1With other medical problems who underwent a screening mammogram that led to a diagnostic mammogram and ultrasound of the left breast. The mammogram and ultrasound showed a 2.9 x 1.6 x 1.9 cm suspicious mass in the left breast at the 12:30 oh clock position 6 cm from the nipple. There was also a suspicious left axillary lymph node. She had image guided biopsy of the left breast mass and the lymph node. The pathology revealed an invasive mammary carcinoma probably ductal phenotype. Tumor was estrogen receptor +100% progesterone receptor +98% proliferation marker Ki-67 13% and HER-2/neu negative. Patient had MRI of the breasts performed on 03/15/2013 the left breast revealed a 7.4 cm area of what the lymph node. Right breast revealed and a 3.3 cm mass.   #2 the patient is now status post left mastectomy with right lumpectomy on 04/22/2013. The final pathology revealed the following: 1. Breast, modified radical mastectomy , left - INVASIVE DUCTAL CARCINOMA, MULTIPLE FOCI, GRADE I/III, THE LARGEST SPANS 5.5 CM. - DUCTAL CARCINOMA IN SITU, LOW GRADE. - METASTATIC CARCINOMA IN 4 OF 22 LYMPH NODES (4/22), WITH EXTRACAPSULAR EXTENSION. - THE SURGICAL RESECTION MARGINS ARE NEGATIVE FOR CARCINOMA. - SEE ONCOLOGY TABLE BELOW. 2. Breast, excision, Right, re-excision medial margin - DUCTAL CARCINOMA IN SITU, INTERMEDIATE GRADE, SPANNING 0.8 CM. - DUCTAL CARCINOMA IN SITU IS FOCALLY LESS THAN 0.1 CM FROM THE NEW MEDIAL  MARGIN OF SPECIMEN # 2. - LOBULAR NEOPLASIA (ATYPICAL LOBULAR HYPERPLASIA). 1 of 5 FINAL for Kathy Maxwell ([id number]) Diagnosis(continued) 3. Breast, lumpectomy, Right - DUCTAL CARCINOMA IN SITU WITH CALCIFICATIONS, INTERMEDIATE TO HIGH GRADE, SPANNING AT LEAST 3.0 CM. - DUCTAL CARCINOMA IN SITU IS BROADLY LESS THAN 0.1 CM FROM THE MEDIAL MARGIN OF SPECIMEN #3. - SEE ONCOLOGY TABLE BELOW. 4. Lymph node, sentinel, biopsy, Right, #1 - THERE IS NO EVIDENCE OF CARCINOMA IN 1 OF 1 LYMPH NODE (0/1). 5. Lymph node, sentinel, biopsy, Right #2 - THERE IS NO EVIDENCE OF CARCINOMA IN 1 OF 1 LYMPH NODE (0/1). 6. Lymph node, sentinel, biopsy, Right #3 - THERE IS NO EVIDENCE OF CARCINOMA IN 1 OF 1 LYMPH NODE (0/1). 7. Lymph node, sentinel, biopsy, Right #4 - THERE IS NO EVIDENCE OF CARCINOMA IN 1 OF 1 LYMPH NODE (0/1). Microscopic Comment  #3 patient is recommended adjuvant chemotherapy consisting of FEC given dose dense for total of 6 cycles followed by weekly Taxol for 12 weeks. We discussed risks benefits and side effects. Her chemotherapy will begin on 05/24/2013.  Current Therapy: FEC cycle 1 day 1  Interval History:  Kathy Maxwell is here for evaluation prior to her first cycle of FEC chemotherapy.  She does have baseline numbness in her left hand that has been present ever since her axillary node dissection.  Otherwise, she denies fevers, chills, nausea, vomiting, constipation, diarrhea, or any further concerns.   Past Medical History: Past Medical History  Diagnosis Date  . Hyperlipidemia   . Hypertension   . Bipolar 1 disorder   . Cancer   .  Hypertension 04/04/2013  . Hyperlipidemia 04/04/2013  . Bipolar 1 disorder 04/04/2013  . Breast cancer 03/10/13    left breast invasive ca  . Allergy     latex  . Obesity   . Diabetes mellitus without complication     NIDDM  . Sleep apnea     no cpap due to insurance  . Heart murmur   . GERD (gastroesophageal reflux disease)   . Coronary  artery disease    Past Surgical History: Past Surgical History  Procedure Laterality Date  . Cholecystectomy    . Breast biopsy Left 05/08/09    fibroadenoma with microcalcifications,no malignancy id  . Breast biopsy Left 03/07/13    invasive ca,1 lymph node metastatic  . 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 Abdominal hysterectomy    . Coronary stent placement  2001  . Cardiac catheterization    . Eye surgery  6/13,9/14    laser,cataract lft  . Coronary angioplasty      LAD stent 2003  . Portacath placement Right 04/22/2013    Procedure: INSERTION PORT-A-CATH;  Surgeon: [name], MD;  Location: [facility] OR;  Service: General;  Laterality: Right;  . Mastectomy modified radical Left 04/22/2013    Procedure: MASTECTOMY MODIFIED RADICAL;  Surgeon: [name], MD;  Location: [facility] OR;  Service: General;  Laterality: Left;  . Breast lumpectomy with needle localization and axillary sentinel lymph node bx Right 04/22/2013    Procedure: BREAST LUMPECTOMY WITH NEEDLE LOCALIZATION AND AXILLARY SENTINEL LYMPH NODE BX;  Surgeon: [name], MD;  Location: [facility] OR;  Service: General;  Laterality: Right;    Family History: Family History  Problem Relation Age of Onset  . Heart disease Mother   . Cancer Maternal Aunt 69    breast cancer  . Cancer Cousin 86    breast cancer    Social History History  Substance Use Topics  . Smoking status: Former Smoker -- 1.00 packs/day for 15 years    Types: Cigarettes    Quit date: 07/19/1987  . Smokeless tobacco: Never Used  . Alcohol Use: No    Allergies: Allergies  Allergen Reactions  . Adhesive [Tape] Rash    Clear tape gloves ,elastic no problem    Current Medications: Current Outpatient Prescriptions  Medication Sig Dispense Refill  . amLODipine (NORVASC) 10 MG tablet Take 10 mg by mouth daily.      . Azilsartan-Chlorthalidone 40-25 MG TABS Take 1 tablet by mouth daily.        [name] buPROPion (WELLBUTRIN SR) 150 MG 12 hr tablet Take 150 mg by mouth daily.       [name] dexamethasone (DECADRON) 4 MG tablet Take 2 tablets by mouth once a day on the day after chemotherapy and then take 2 tablets two times a day for 2 days. Take with food.  30 tablet  1  . hydrALAZINE (APRESOLINE) 25 MG tablet Take 25 mg by mouth 2 (two) times daily.      [name] HYDROcodone-acetaminophen (NORCO/VICODIN) 5-325 MG per tablet Take 1-2 tablets by mouth every 4 (four) hours as needed for pain.  30 tablet  0  . Lancet Devices (EASY TOUCH LANCING DEVICE) MISC       . lidocaine-prilocaine (EMLA) cream Apply topically as needed.  30 g  0  . LITE TOUCH LANCETS MISC       . LORazepam (ATIVAN) 0.5 MG tablet Take 1 tablet (0.5mg ) every 6 hours as needed for  nausea or vomiting.  30 tablet  0  . metoprolol succinate (TOPROL-XL) 25 MG 24 hr tablet Take 25 mg by mouth daily.      . ondansetron (ZOFRAN) 8 MG tablet Take 1 tablet (8mg s) every 12 hours as needed for nausea or vomiting starting the 3rd day after chemotherapy.  30 tablet  1  . ONE TOUCH ULTRA TEST test strip       . pantoprazole (PROTONIX) 40 MG tablet Take 40 mg by mouth daily.      . pioglitazone-metformin (ACTOPLUS MET) 15-850 MG per tablet Take 1 tablet by mouth 2 (two) times daily with a meal.      . prochlorperazine (COMPAZINE) 10 MG tablet Take 1 tablet (10 mg total) by mouth every 6 (six) hours as needed (Nausea or vomiting).  30 tablet  1  . rosuvastatin (CRESTOR) 5 MG tablet Take 5 mg by mouth daily.      . simvastatin (ZOCOR) 40 MG tablet Take 40 mg by mouth every evening.      . sitaGLIPtin (JANUVIA) 100 MG tablet Take 100 mg by mouth daily.      [name] losartan (COZAAR) 100 MG tablet Take 100 mg by mouth daily.        No current facility-administered medications for this visit.   Facility-Administered Medications Ordered in Other Visits  Medication Dose Route Frequency Provider Last Rate Last Dose  . sodium chloride 0.9 % injection 10 mL  10 mL  Intravenous PRN Victorino December, MD   10 mL at 05/24/13 1522      REVIEW OF SYSTEMS:  A 10 point review of systems was conducted and is otherwise negative except for what is noted above.     PHYSICAL EXAMINATION: Blood pressure 134/74, pulse 71, temperature 98.9 F (37.2 C), temperature source Oral, resp. rate 18, height 5\' 2"  (1.575 m), weight 206 lb (93.441 kg). General: Patient is a well appearing female in no acute distress HEENT: PERRLA, sclerae anicteric no conjunctival pallor, MMM Neck: supple, no palpable adenopathy Lungs: clear to auscultation bilaterally, no wheezes, rhonchi, or rales Cardiovascular: regular rate rhythm, S1, S2, no murmurs, rubs or gallops Abdomen: Soft, non-tender, non-distended, normoactive bowel sounds, no HSM Extremities: warm and well perfused, no clubbing, cyanosis, or edema Skin: No rashes or lesions Neuro: Non-focal ECOG: 1    STUDIES/RESULTS: US Breast Bilateral  03/28/2013   *RADIOLOGY REPORT*  Clinical Data:  Patient with recent diagnosis of left breast cancer status post recent bilateral breast MRI.  The patient presents for evaluation of the left breast to help define the extent of disease as well as for second look ultrasound of the right breast to attempt to locate/biopsy the area of non mass enhancement within the right breast on prior MRI.  Additionally there were questioned lymph nodes within the right axilla.  BILATERAL BREAST ULTRASOUND  Comparison:  Breast MRI 03/15/2013  On physical exam, no definite discrete masses are able to be palpated within either breast.  Findings:  Ultrasound is performed of the left breast and the previously identified/biopsied irregular shadowing mass was able to be identified.  The patient has dense parenchymal tissue with areas of posterior acoustic scattering scattered throughout the breast making visualization of any discrete additional masses identified on prior MRI difficult.  Ultrasound evaluation of the  right breast did not reveal any correlate for the described non mass enhancement centrally.  Evaluation of the right axilla demonstrated a few lymph nodes without significantly thickened cortices.  IMPRESSION: 1.  Previously biopsied mass within the left breast is able to be identified with ultrasound. Additional areas identified on the recent MRI are not able to be discretely identified likely secondary to diffuse shadowing parenchymal tissue throughout the left breast.  If true evaluation of the extent of disease is desired for possible breast conservation therapy, the most accurate assessment would be MRI guided biopsy of the anterior most aspect of the abnormal enhancement within the left breast.  2.  No definite ultrasound correlate was identified within the right breast to correspond with non mass enhancement identified on recent prior MRI.  Recommend right breast MRI guided biopsy for further evaluation.  RECOMMENDATION: Bilateral MRI guided core needle biopsies.  These have been scheduled for 04/03/2013 at 7:15 a.m.  I have discussed the findings and recommendations with the patient. Results were also provided in writing at the conclusion of the visit.  If applicable, a reminder letter will be sent to the patient regarding the next appointment.  BI-RADS CATEGORY 4:  Suspicious abnormality - biopsy should be considered.   Original Report Authenticated By: Annia Belt, M.D   Mr Breast Bilateral W Wo Contrast  03/15/2013   **ADDENDUM** CREATED: 03/15/2013 14:02:33  I discussed this case with Dr. Derrell Lolling over the phone.  In light of the fact that the patient has bipolar disorder, Dr. Derrell Lolling would like [name] to discuss the options of mastectomy versus lumpectomy of the left breast with the patient returns for second look ultrasound and possible biopsy.   Addended by:  Sherian Rein, M.D. on 03/15/2013 14:02:33.  **END ADDENDUM** SIGNED BY: Sherian Rein, M.D.  03/15/2013   *RADIOLOGY REPORT*  Clinical Data:  Recent  diagnosis of left breast cancer.  The patient has had prior benign biopsies of both breasts.  The patient has a bipolar disorder.  MRI BILATERAL BREAST WITHOUT AND WITH CONTRAST  Technique: Multiplanar, multisequence MR images of the right breast were obtained prior to and following the intravenous administration of 14ml of multi hance.  Labs were obtained at [facility].  BUN is 18. Creatinine is 1.0  Comparison:  Recent imaging examinations.  FINDINGS:  Breast composition:  c:  Heterogeneous fibroglandular tissue  Background parenchymal enhancement:  Mild  Left breast:  There are at least four focus of abnormal spiculated enhancement demonstrating washout kinetics in the upper left breast centrally.   The entirety area measures 7.4 cm transverse x 6.6 cm anterior-posterior x 5.1 cm cranial-caudal. The recently biopsy focus is posteriorly located measuring 2.1 x 1 x 2.1 cm.  The anterior focus that was not recently biopsies measures 3.1 x 2.6 x 2.6 cm.  There is a lateral focus not recently biopsied measuring 1.1 x 1.4 x 1.6 cm.  There is a small medial focus measuring cysts 0.7 x 0.8 x 0.7 cm.  Post biopsy changes are identified in the left axilla.  There are abnormal enlarged lymph nodes correlating to the patient's known metastatic neoplasm to the lymph nodes with the largest lymph node measuring 1.8 x 0.8 cm.  Right breast:   There is an irregular focus of 3.3 x 3.1 x 1.3 cm abnormal enhancement in the central right breast in the middle one third demonstrating washout kinetics.  There are mild thickened cortex and prominent lymph nodes within the right axilla, largest measures 1.6 x 1 cm.  There is a posterior central upper breast focus with associated clip artifact measuring 1.2 x 2.2 x 1 cm previously biopsied to be benign.  IMPRESSION:  Impression: Suspicious findings in bilateral breasts.  1. Several abnormal enhancing foci encompassing 7.4 x 6.6 x 5.1 cm area of the upper left breast. The  posterior focus was most recently biopsy to be cancer.   2. Abnormal enhancing focus in the central middle one third of right breast and mild prominent right axillary lymph nodes.  RECOMMENDATION: 1. If the patient is interested left breast conservation, then a second look ultrasound with biopsy of the most anterior focus is recommended to assess the extent of disease in the left breast.  2.  Recommend a second look ultrasound with biopsy of the right breast mass and right axillary lymph node.  If the area within the right breast cannot be found on ultrasound, an MR guided core biopsy is recommended.  BI-RADS CATEGORY 5:  Highly suggestive of malignancy - appropriate action should be taken.  THREE-DIMENSIONAL MR IMAGE RENDERING ON INDEPENDENT WORKSTATION:  Three-dimensional MR images were rendered by post-processing of the original MR data on a DynaCad workstation.  The three-dimensional MR images were interpreted, and findings were reported in the accompanying complete MRI report for this study.   Original Report Authenticated By: Sherian Rein, M.D.   Mm Digital Diagnostic Bilat  04/03/2013   *RADIOLOGY REPORT*  Clinical Data:  Known left breast cancer.  Abnormal enhancement seen in both breast with MR imaging.  DIGITAL DIAGNOSTIC BILATERAL MAMMOGRAM  Comparison:  Previous exams.  Findings:  Mammographic images were obtained following MR guided core biopsies of both breast. A cylindrical shaped clip is seen in the central portion of the right breast.  A cylindrical clip is seen in the anterior aspect of the left breast.  The clip is located 6.4 cm anterior inferior to the ribbon shaped clip.  IMPRESSION: Status post MR guided core biopsies of both breast with pathology pending.  Final Assessment:  Post Procedure Mammograms for Marker Placement   Original Report Authenticated By: Baird Lyons, M.D.   Mm Digital Diagnostic Unilat L  03/07/2013   *RADIOLOGY REPORT*  Clinical Data: Patient is post ultrasound guided  core biopsy of a 2.9 cm mass over the 12:30 position of the left breast as well as biopsy of a borderline abnormal left axillary lymph node.  ULTRASOUND GUIDED POST-BIOPSY CLIP PLACEMENT LEFT  Comparison:  Previous exams.  Findings: Exam demonstrates a ribbon shaped metallic clip along the posterior edge of patient's biopsied mass at the 12:30 position of the left breast.  IMPRESSION: Satisfactory clip placement post ultrasound guided core biopsy left breast.   Original Report Authenticated By: Elberta Fortis, M.D.   [name] Lt Breast Bx W Loc Dev 1st Lesion Img Bx Spec US Guide  03/08/2013   **ADDENDUM** CREATED: 03/08/2013 14:11:52  I spoke with the patient by telephone on March 08, 2013 to discuss pathology results.  Pathology demonstrates invasive carcinoma at the biopsy site in the left breast as well as metastatic disease within the biopsied left axillary lymph node.  The patient reports no problems at the biopsy site.  All questions were answered.  Recommendations:  Surgical consultation is recommended and has been scheduled for March 15, 2013 at 11:15 a.m. with Dr. Derrell Lolling.  Additionally the patient is scheduled to have a breast MRI on March 15, 2013 at 8:30 a.m.  **END ADDENDUM** SIGNED BY: Antonieta Loveless, M.D  03/07/2013   *RADIOLOGY REPORT*  Clinical Data:  Patient presents for ultrasound-guided core biopsy of a suspicious 2.9 cm mass at the 12:30 position of the left breast as  well as a borderline left axillary lymph node.  ULTRASOUND GUIDED VACUUM ASSISTED CORE BIOPSY OF THE LEFT BREAST  Comparison: Previous exams.  I met with the patient and we discussed the procedure of ultrasound- guided biopsy, including benefits and alternatives.  We discussed the high likelihood of a successful procedure. We discussed the risks of the procedure including infection, bleeding, tissue injury, clip migration, and inadequate sampling.  Informed written consent was given. The usual time-out protocol was performed  immediately prior to the procedure.  Using sterile technique and 2% Lidocaine as local anesthetic, under direct ultrasound visualization, a 13 gauge vacuum-assisted device was used to perform biopsy of the targeted 2.9 cm mass at the 12:30 position using a lateral to medial approach. At the conclusion of the procedure, a ribbon shaped tissue marker clip was deployed into the biopsy cavity.  Follow-up 2-view mammogram was performed and dictated separately.  I met with the patient and we discussed the procedure of ultrasound- guided biopsy, including benefits and alternatives.  We discussed the high likelihood of a successful procedure. We discussed the risks of the procedure including infection, bleeding, tissue injury, clip migration, and inadequate sampling.  Informed written consent was given. The usual time-out protocol was performed immediately prior to the procedure.  Using sterile technique and 2% Lidocaine as local anesthetic, under direct ultrasound visualization, a 13 gauge vacuum-assisted device was used to perform biopsy of the targeted borderline abnormal left axillary lymph node using a lateral to medial approach.  No clip was placed into the lymph node.  The patient tolerated the procedure without complications.  IMPRESSION: Ultrasound-guided biopsy of the a suspicious 2.9 cm mass at the 12:30 position of the left breast as well as a borderline abnormal left axillary lymph node.  Original Report Authenticated By: Elberta Fortis, M.D.   [name] Lt Breast Bx W Loc Dev Ea Add Lesion Img Bx Spec US Guide  03/08/2013   **ADDENDUM** CREATED: 03/08/2013 14:11:52  I spoke with the patient by telephone on March 08, 2013 to discuss pathology results.  Pathology demonstrates invasive carcinoma at the biopsy site in the left breast as well as metastatic disease within the biopsied left axillary lymph node.  The patient reports no problems at the biopsy site.  All questions were answered.  Recommendations:  Surgical  consultation is recommended and has been scheduled for March 15, 2013 at 11:15 a.m. with Dr. Derrell Lolling.  Additionally the patient is scheduled to have a breast MRI on March 15, 2013 at 8:30 a.m.  **END ADDENDUM** SIGNED BY: Antonieta Loveless, M.D  03/07/2013   *RADIOLOGY REPORT*  Clinical Data:  Patient presents for ultrasound-guided core biopsy of a suspicious 2.9 cm mass at the 12:30 position of the left breast as well as a borderline left axillary lymph node.  ULTRASOUND GUIDED VACUUM ASSISTED CORE BIOPSY OF THE LEFT BREAST  Comparison: Previous exams.  I met with the patient and we discussed the procedure of ultrasound- guided biopsy, including benefits and alternatives.  We discussed the high likelihood of a successful procedure. We discussed the risks of the procedure including infection, bleeding, tissue injury, clip migration, and inadequate sampling.  Informed written consent was given. The usual time-out protocol was performed immediately prior to the procedure.  Using sterile technique and 2% Lidocaine as local anesthetic, under direct ultrasound visualization, a 13 gauge vacuum-assisted device was used to perform biopsy of the targeted 2.9 cm mass at the 12:30 position using a lateral to medial approach. At the conclusion  of the procedure, a ribbon shaped tissue marker clip was deployed into the biopsy cavity.  Follow-up 2-view mammogram was performed and dictated separately.  I met with the patient and we discussed the procedure of ultrasound- guided biopsy, including benefits and alternatives.  We discussed the high likelihood of a successful procedure. We discussed the risks of the procedure including infection, bleeding, tissue injury, clip migration, and inadequate sampling.  Informed written consent was given. The usual time-out protocol was performed immediately prior to the procedure.  Using sterile technique and 2% Lidocaine as local anesthetic, under direct ultrasound visualization, a 13 gauge  vacuum-assisted device was used to perform biopsy of the targeted borderline abnormal left axillary lymph node using a lateral to medial approach.  No clip was placed into the lymph node.  The patient tolerated the procedure without complications.  IMPRESSION: Ultrasound-guided biopsy of the a suspicious 2.9 cm mass at the 12:30 position of the left breast as well as a borderline abnormal left axillary lymph node.  Original Report Authenticated By: Elberta Fortis, M.D.   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  This is concordant.  The patient has an appointment with oncology today and with Dr. Derrell Lolling on 04/08/13.  The patient and I discussed these results and appointments by phone today at 11:00.  She stated she was doing fine after the biopsy.  **END ADDENDUM** SIGNED BY: Esperanza Heir, M.D.  04/03/2013   *RADIOLOGY REPORT*  Clinical Data:  Known left breast cancer.  Abnormal MRI.  Evaluate extent of disease.  MRI GUIDED VACUUM ASSISTED BIOPSY OF THE LEFT BREAST WITHOUT AND WITH CONTRAST  Comparison: Previous exams.  Technique: Multiplanar, multisequence MR images of both breast were obtained prior to and following the intravenous administration of 19 ml of Mulithance.  I met with the patient, and we discussed the procedure of MRI guided biopsy, including risks, benefits, and alternatives. Specifically, we discussed the risks of infection, bleeding, tissue injury, clip migration, and inadequate sampling.  Informed, written consent was given.  Using sterile technique, 2% Lidocaine, MRI guidance, and a 9 gauge vacuum assisted device, biopsy was performed of abnormal enhancement in the anterior aspect of the left breast using a lung approach.  At the conclusion of the procedure, a cylindrical show tissue marker clip was deployed into the biopsy cavity.  IMPRESSION: MRI  guided biopsy of the left breast. No apparent complications.  THREE-DIMENSIONAL MR IMAGE RENDERING ON INDEPENDENT WORKSTATION:  Three-dimensional MR images were rendered by post-processing of the original MR data on a DynaCad workstation.  The three-dimensional MR images were interpreted, and findings were reported in the accompanying complete MRI report for this study.   Original Report Authenticated By: Baird Lyons, M.D.   Mr Rt Breast Bx Jones Bales Dev 1st Lesion Image Bx Spec Mr Guide  04/03/2013   *RADIOLOGY REPORT*  Clinical Data:  Known left breast cancer.  Abnormal enhancement seen in the right breast.  MRI GUIDED VACUUM ASSISTED BIOPSY OF THE RIGHT BREAST WITHOUT AND WITH CONTRAST  Comparison: Previous exams.  Technique: Multiplanar, multisequence MR images of both breast were obtained prior to and following the intravenous administration of 19 ml of Mulithance.  I met with the patient, and we discussed the procedure of MRI guided biopsy, including risks, benefits, and alternatives. Specifically, we discussed the risks of infection, bleeding, tissue  injury, clip migration, and inadequate sampling.  Informed, written consent was given.  Using sterile technique, 2% Lidocaine, MRI guidance, and a 9 gauge vacuum assisted device, biopsy was performed of abnormal enhancement in the central portion of the right breast using a lateral approach.  At the conclusion of the procedure, a cylindrical shaped tissue marker clip was deployed into the biopsy cavity.  IMPRESSION: MRI guided biopsy of the right breast. No apparent complications.  THREE-DIMENSIONAL MR IMAGE RENDERING ON INDEPENDENT WORKSTATION:  Three-dimensional MR images were rendered by post-processing of the original MR data on a DynaCad workstation.  The three-dimensional MR images were interpreted, and findings were reported in the accompanying complete MRI report for this study.   Original Report Authenticated By: Baird Lyons, M.D.     LABS:     Chemistry      Component Value Date/Time   NA 139 05/24/2013 1011   NA 134* 04/24/2013 0550   K 4.1 05/24/2013 1011   K 3.8 04/24/2013 0550   CL 101 04/24/2013 0550   CO2 23 05/24/2013 1011   CO2 25 04/24/2013 0550   BUN 22.2 05/24/2013 1011   BUN 19 04/24/2013 0550   CREATININE 1.0 05/24/2013 1011   CREATININE 0.98 04/24/2013 0550      Component Value Date/Time   CALCIUM 10.0 05/24/2013 1011   CALCIUM 8.3* 04/24/2013 0550   ALKPHOS 103 05/24/2013 1011   AST 13 05/24/2013 1011   ALT 8 05/24/2013 1011   BILITOT 0.35 05/24/2013 1011      Lab Results  Component Value Date   WBC 7.4 05/24/2013   HGB 11.3* 05/24/2013   HCT 35.3 05/24/2013   MCV 93.1 05/24/2013   PLT 301 05/24/2013   PATHOLOGY: ADDITIONAL INFORMATION: 1. CHROMOGENIC IN-SITU HYBRIDIZATION Results: HER-2/NEU BY CISH - NO AMPLIFICATION OF HER-2 DETECTED. RESULT RATIO OF HER2: CEP 17 SIGNALS 0.78 AVERAGE HER2 COPY NUMBER PER CELL 2.10 REFERENCE RANGE NEGATIVE HER2/Chr17 Ratio <2.0 and Average HER2 copy number <4.0 EQUIVOCAL HER2/Chr17 Ratio <2.0 and Average HER2 copy number 4.0 and <6.0 POSITIVE HER2/Chr17 Ratio >=2.0 and/or Average HER2 copy number >=6.0 Pecola Leisure MD Pathologist, Electronic Signature ( Signed 05/01/2013) FINAL DIAGNOSIS Diagnosis 1. Breast, modified radical mastectomy , left - INVASIVE DUCTAL CARCINOMA, MULTIPLE FOCI, GRADE I/III, THE LARGEST SPANS 5.5 CM. - DUCTAL CARCINOMA IN SITU, LOW GRADE. - METASTATIC CARCINOMA IN 4 OF 22 LYMPH NODES (4/22), WITH EXTRACAPSULAR EXTENSION. - THE SURGICAL RESECTION MARGINS ARE NEGATIVE FOR CARCINOMA. - SEE ONCOLOGY TABLE BELOW. 2. Breast, excision, Right, re-excision medial margin - DUCTAL CARCINOMA IN SITU, INTERMEDIATE GRADE, SPANNING 0.8 CM. - DUCTAL CARCINOMA IN SITU IS FOCALLY LESS THAN 0.1 CM FROM THE NEW MEDIAL MARGIN OF SPECIMEN # 2. - LOBULAR NEOPLASIA (ATYPICAL LOBULAR HYPERPLASIA). 1 of 5 FINAL for YAMILEX, BORGWARDT ([id number]) Diagnosis(continued) 3.  Breast, lumpectomy, Right - DUCTAL CARCINOMA IN SITU WITH CALCIFICATIONS, INTERMEDIATE TO HIGH GRADE, SPANNING AT LEAST 3.0 CM. - DUCTAL CARCINOMA IN SITU IS BROADLY LESS THAN 0.1 CM FROM THE MEDIAL MARGIN OF SPECIMEN #3. - SEE ONCOLOGY TABLE BELOW. 4. Lymph node, sentinel, biopsy, Right, #1 - THERE IS NO EVIDENCE OF CARCINOMA IN 1 OF 1 LYMPH NODE (0/1). 5. Lymph node, sentinel, biopsy, Right #2 - THERE IS NO EVIDENCE OF CARCINOMA IN 1 OF 1 LYMPH NODE (0/1). 6. Lymph node, sentinel, biopsy, Right #3 - THERE IS NO EVIDENCE OF CARCINOMA IN 1 OF 1 LYMPH NODE (0/1). 7. Lymph node, sentinel, biopsy, Right #4 - THERE IS NO EVIDENCE  OF CARCINOMA IN 1 OF 1 LYMPH NODE (0/1). Microscopic Comment 1. BREAST, INVASIVE TUMOR, WITH LYMPH NODE SAMPLING (PART 1) Specimen, including laterality: Left breast. Procedure: Modified radical mastectomy. Histologic type: Ductal Grade: I Tubule formation: 2 Nuclear pleomorphism: 1 Mitotic:1 Tumor size (gross measurement): The largest focus spans 5.5 cm and the smaller two foci span 2.8 cm and 1.8 cm Margins: Invasive, distance to closest margin: 2.0 cm to the posterior margin (gross measurement) In-situ, distance to closest margin: 2.0 cm to the posterior margin (gross measurement). Lymphovascular invasion: Not identified. Ductal carcinoma in situ: Present. Grade: Low grade Extensive intraductal component: No. Lobular neoplasia: Not present in specimen # 1. Tumor focality: Multifocal. Treatment effect: N/A Extent of tumor: Confined to breast parenchyma. Lymph nodes: Examined: 22 Lymph nodes with metastasis: 4 Isolated tumor cells (< 0.2 mm): 1 Micrometastasis: (> 0.2 mm and < 2.0 mm): 0 Macrometastasis: (> 2.0 mm): 3 Extracapsular extension: Present. Breast prognostic profile: [phone] Estrogen receptor: 100%, strong staining intensity. Progesterone receptor: 23%, strong staining intensity. Her 2 neu: No amplification was detected. Ki-67:  33%. Non-neoplastic breast: Healing biopsy site. Her2 will be repeated on the largest tumor in the current case and the results reported separately. TNM: mpT3, pN2a. 2 of 5 FINAL for [place], BREEDING ([id number]) Microscopic Comment(continued) Comments: An E-cadherin stain performed on multiple blocks (1C, 6F and 1I), shows that the carcinoma cells are strongly positive for E-cadherin, supporting a ductal phenotype. (JBK:gt, 04/25/13) 3. BREAST, IN SITU CARCINOMA (PART 3) Specimen, including laterality: Right breast. Procedure (include lymph node sampling sentinel-non-sentinel: Needle localized lumpectomy. Grade of carcinoma: Intermediate to high grade. Necrosis: Present. Estimated tumor size: (gross measurement): At least 3.0 cm, see comment. Treatment effect: N/A Distance to closest margin: Broadly less than 0.1 cm from the medial margin of specimen of # 3. Breast prognostic profile: Case [phone] Estrogen receptor: 100%, strong staining intensity. Progesterone receptor: 11%, strong staining intensity. Lymph nodes: Examined: 4 Sentinel 0 Non-sentinel 4 Total Lymph nodes with metastasis: 0 TNM: pTis, pN0 Comments: Although there is a 3.0 cm span of intermediate to high grade ductal carcinoma present in specimen # 3, the additional 0.8 cm focus of DCIS in specimen # 2 raises the possibility that carcinoma in situ spans a length greater than 3.0 cm. 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 On MRI patient was found to have right-sided breast cancer also. This has been biopsied and it shows a ductal  carcinoma in situ. #2 patient and I discussed all of her pathology is today. I do think that she is a good mastectomy candidate on the left side to 2 multiple foci of invasive disease as well as ductal carcinoma in situ and extent of disease. Certainly she will need a full axillary lymph node dissection. We discussed the possibility of her seeing a Engineer, petroleum for reconstruction purposes.  #2 patient is status post left modified radical mastectomy and right lumpectomy. Final pathology is as noted above.  #3 patient has been referred to genetic counseling with [employer].  #3 patient and I discussed treatment with chemotherapy. She will need FEC followed by Taxol.we discussed risks benefits and side effects of  chemotherapy. My plan is to have the first treatment started on 05/24/2013. Antiemetics were all sentinel or pharmacies. She has had echocardiogram and chemotherapy teaching class.     PLAN:    #1 Patient is doing well, she will proceed with chemotherapy.  Her echo was normal, her labs are stable.    #2 We reviewed her anti-emetic regimen.  She verbalized understanding of how to take these medications, and will call us if she has any increased nausea/vomiting.    #3 She and I discussed the Neulasta injection and she will take Claritin in the morning prior to her injection and then daily x 5 days.    #4 She will return tomorrow for [facility] and in one week for labs and evaluation of chemotoxicities.    All questions were answered. The patient knows to call the clinic with any problems, questions or concerns. We can certainly see the patient much sooner if necessary.  I spent 25 minutes counseling the patient face to face.  The total time spent in the appointment was 30 minutes.  Illa Level, NP Medical Oncology [facility] [phone]

## 2013-05-24 NOTE — Progress Notes (Signed)
The pt came in for drain removal.  She starts chemo today.  Her drainage has decreased.  It has been a bloody color.  There are no signs of infection.  Her output was 15 cc yesterday at 10 am and I emptied it today for the 1 st time since then.  It was less than 10 cc.  I told her I can remove it.  I removed the drain without difficulty.  She tolerated the procedure well.  I applied a dry gauze bandage with tape.  I advised the pt what problems to call for.  She understands.  I told her I will refer her to physical therapy per Dr Jacinto Halim instructions.  They will call her with the appointment.

## 2013-05-24 NOTE — Progress Notes (Signed)
CSWIntern met briefly with Patient today to provide support as she began her chemo treatment.    Kathy Maxwell S. [facility] Clinical Social Work Intern [employer] [phone]

## 2013-05-24 NOTE — Patient Instructions (Signed)
You may now shower.  Keep drain site clean and dry.  It will probably close in a day or two.  Keep a dry bandage over it.  Call with any concerns of fluid collection or infection.  We will refer you for physical therapy.

## 2013-05-24 NOTE — Patient Instructions (Signed)
[facility] Discharge Instructions for Patients Receiving Chemotherapy  Today you received the following chemotherapy agents :  Epirubicin, 5FU, Cytoxan To help prevent nausea and vomiting after your treatment, we encourage you to take your nausea medication Cyclophosphamide injection What is this medicine? CYCLOPHOSPHAMIDE (sye kloe FOSS fa mide) is a chemotherapy drug. It slows the growth of cancer cells. This medicine is used to treat many types of cancer like lymphoma, myeloma, leukemia, breast cancer, and ovarian cancer, to name a few. This medicine may be used for other purposes; ask your health care provider or pharmacist if you have questions. COMMON BRAND NAME(S): Cytoxan, Neosar What should I tell my health care provider before I take this medicine? They need to know if you have any of these conditions: -blood disorders -history of other chemotherapy -infection -kidney disease -liver disease -recent or ongoing radiation therapy -tumors in the bone marrow -an unusual or allergic reaction to cyclophosphamide, other chemotherapy, other medicines, foods, dyes, or preservatives -pregnant or trying to get pregnant -breast-feeding How should I use this medicine? This drug is usually given as an injection into a vein or muscle or by infusion into a vein. It is administered in a hospital or clinic by a specially trained health care professional. Talk to your pediatrician regarding the use of this medicine in children. Special care may be needed. Overdosage: If you think you have taken too much of this medicine contact a poison control center or emergency room at once. NOTE: This medicine is only for you. Do not share this medicine with others. What if I miss a dose? It is important not to miss your dose. Call your doctor or health care professional if you are unable to keep an appointment. What may interact with this medicine? This medicine may interact with the following  medications: -amiodarone -amphotericin B -azathioprine -certain antiviral medicines for HIV or AIDS such as protease inhibitors (e.g., indinavir, ritonavir) and zidovudine -certain blood pressure medications such as benazepril, captopril, enalapril, fosinopril, lisinopril, moexipril, monopril, perindopril, quinapril, ramipril, trandolapril -certain cancer medications such as anthracyclines (e.g., daunorubicin, doxorubicin), busulfan, cytarabine, paclitaxel, pentostatin, tamoxifen, trastuzumab -certain diuretics such as chlorothiazide, chlorthalidone, hydrochlorothiazide, indapamide, metolazone -certain medicines that treat or prevent blood clots like warfarin -certain muscle relaxants such as succinylcholine -cyclosporine -etanercept -indomethacin -medicines to increase blood counts like filgrastim, pegfilgrastim, sargramostim -medicines used as general anesthesia -metronidazole -natalizumab This list may not describe all possible interactions. Give your health care provider a list of all the medicines, herbs, non-prescription drugs, or dietary supplements you use. Also tell them if you smoke, drink alcohol, or use illegal drugs. Some items may interact with your medicine. What should I watch for while using this medicine? Visit your doctor for checks on your progress. This drug may make you feel generally unwell. This is not uncommon, as chemotherapy can affect healthy cells as well as cancer cells. Report any side effects. Continue your course of treatment even though you feel ill unless your doctor tells you to stop. Drink water or other fluids as directed. Urinate often, even at night. In some cases, you may be given additional medicines to help with side effects. Follow all directions for their use. Call your doctor or health care professional for advice if you get a fever, chills or sore throat, or other symptoms of a cold or flu. Do not treat yourself. This drug decreases your body's  ability to fight infections. Try to avoid being around people who are sick. This medicine  may increase your risk to bruise or bleed. Call your doctor or health care professional if you notice any unusual bleeding. Be careful brushing and flossing your teeth or using a toothpick because you may get an infection or bleed more easily. If you have any dental work done, tell your dentist you are receiving this medicine. You may get drowsy or dizzy. Do not drive, use machinery, or do anything that needs mental alertness until you know how this medicine affects you. Do not become pregnant while taking this medicine or for 1 year after stopping it. Women should inform their doctor if they wish to become pregnant or think they might be pregnant. Men should not father a child while taking this medicine and for 4 months after stopping it. There is a potential for serious side effects to an unborn child. Talk to your health care professional or pharmacist for more information. Do not breast-feed an infant while taking this medicine. This medicine may interfere with the ability to have a child. This medicine has caused ovarian failure in some women. This medicine has caused reduced sperm counts in some men. You should talk with your doctor or health care professional if you are concerned about your fertility. If you are going to have surgery, tell your doctor or health care professional that you have taken this medicine. What side effects may I notice from receiving this medicine? Side effects that you should report to your doctor or health care professional as soon as possible: -allergic reactions like skin rash, itching or hives, swelling of the face, lips, or tongue -low blood counts - this medicine may decrease the number of white blood cells, red blood cells and platelets. You may be at increased risk for infections and bleeding. -signs of infection - fever or chills, cough, sore throat, pain or difficulty  passing urine -signs of decreased platelets or bleeding - bruising, pinpoint red spots on the skin, black, tarry stools, blood in the urine -signs of decreased red blood cells - unusually weak or tired, fainting spells, lightheadedness -breathing problems -dark urine -dizziness -palpitations -swelling of the ankles, feet, hands -trouble passing urine or change in the amount of urine -weight gain -yellowing of the eyes or skin Side effects that usually do not require medical attention (report to your doctor or health care professional if they continue or are bothersome): -changes in nail or skin color -hair loss -missed menstrual periods -mouth sores -nausea, vomiting This list may not describe all possible side effects. Call your doctor for medical advice about side effects. You may report side effects to FDA at 1-800-FDA-1088. Where should I keep my medicine? This drug is given in a hospital or clinic and will not be stored at home. NOTE: This sheet is a summary. It may not cover all possible information. If you have questions about this medicine, talk to your doctor, pharmacist, or health care provider.  2014, [employer]. (2012-05-18 16:22:58) Fluorouracil, 5-FU injection What is this medicine? FLUOROURACIL, 5-FU (flure oh YOOR a sil) is a chemotherapy drug. It slows the growth of cancer cells. This medicine is used to treat many types of cancer like breast cancer, colon or rectal cancer, pancreatic cancer, and stomach cancer. This medicine may be used for other purposes; ask your health care provider or pharmacist if you have questions. COMMON BRAND NAME(S): Adrucil What should I tell my health care provider before I take this medicine? They need to know if you have any of these conditions: -blood  disorders -dihydropyrimidine dehydrogenase (DPD) deficiency -infection (especially a virus infection such as chickenpox, cold sores, or herpes) -kidney disease -liver  disease -malnourished, poor nutrition -recent or ongoing radiation therapy -an unusual or allergic reaction to fluorouracil, other chemotherapy, other medicines, foods, dyes, or preservatives -pregnant or trying to get pregnant -breast-feeding How should I use this medicine? This drug is given as an infusion or injection into a vein. It is administered in a hospital or clinic by a specially trained health care professional. Talk to your pediatrician regarding the use of this medicine in children. Special care may be needed. Overdosage: If you think you have taken too much of this medicine contact a poison control center or emergency room at once. NOTE: This medicine is only for you. Do not share this medicine with others. What if I miss a dose? It is important not to miss your dose. Call your doctor or health care professional if you are unable to keep an appointment. What may interact with this medicine? -allopurinol -cimetidine -dapsone -digoxin -hydroxyurea -leucovorin -levamisole -medicines for seizures like ethotoin, fosphenytoin, phenytoin -medicines to increase blood counts like filgrastim, pegfilgrastim, sargramostim -medicines that treat or prevent blood clots like warfarin, enoxaparin, and dalteparin -methotrexate -metronidazole -pyrimethamine -some other chemotherapy drugs like busulfan, cisplatin, estramustine, vinblastine -trimethoprim -trimetrexate -vaccines Talk to your doctor or health care professional before taking any of these medicines: -acetaminophen -aspirin -ibuprofen -ketoprofen -naproxen This list may not describe all possible interactions. Give your health care provider a list of all the medicines, herbs, non-prescription drugs, or dietary supplements you use. Also tell them if you smoke, drink alcohol, or use illegal drugs. Some items may interact with your medicine. What should I watch for while using this medicine? Visit your doctor for checks on  your progress. This drug may make you feel generally unwell. This is not uncommon, as chemotherapy can affect healthy cells as well as cancer cells. Report any side effects. Continue your course of treatment even though you feel ill unless your doctor tells you to stop. In some cases, you may be given additional medicines to help with side effects. Follow all directions for their use. Call your doctor or health care professional for advice if you get a fever, chills or sore throat, or other symptoms of a cold or flu. Do not treat yourself. This drug decreases your body's ability to fight infections. Try to avoid being around people who are sick. This medicine may increase your risk to bruise or bleed. Call your doctor or health care professional if you notice any unusual bleeding. Be careful brushing and flossing your teeth or using a toothpick because you may get an infection or bleed more easily. If you have any dental work done, tell your dentist you are receiving this medicine. Avoid taking products that contain aspirin, acetaminophen, ibuprofen, naproxen, or ketoprofen unless instructed by your doctor. These medicines may hide a fever. Do not become pregnant while taking this medicine. Women should inform their doctor if they wish to become pregnant or think they might be pregnant. There is a potential for serious side effects to an unborn child. Talk to your health care professional or pharmacist for more information. Do not breast-feed an infant while taking this medicine. Men should inform their doctor if they wish to father a child. This medicine may lower sperm counts. Do not treat diarrhea with over the counter products. Contact your doctor if you have diarrhea that lasts more than 2 days or if it is  severe and watery. This medicine can make you more sensitive to the sun. Keep out of the sun. If you cannot avoid being in the sun, wear protective clothing and use sunscreen. Do not use sun lamps or  tanning beds/booths. What side effects may I notice from receiving this medicine? Side effects that you should report to your doctor or health care professional as soon as possible: -allergic reactions like skin rash, itching or hives, swelling of the face, lips, or tongue -low blood counts - this medicine may decrease the number of white blood cells, red blood cells and platelets. You may be at increased risk for infections and bleeding. -signs of infection - fever or chills, cough, sore throat, pain or difficulty passing urine -signs of decreased platelets or bleeding - bruising, pinpoint red spots on the skin, black, tarry stools, blood in the urine -signs of decreased red blood cells - unusually weak or tired, fainting spells, lightheadedness -breathing problems -changes in vision -chest pain -mouth sores -nausea and vomiting -pain, swelling, redness at site where injected -pain, tingling, numbness in the hands or feet -redness, swelling, or sores on hands or feet -stomach pain -unusual bleeding Side effects that usually do not require medical attention (report to your doctor or health care professional if they continue or are bothersome): -changes in finger or toe nails -diarrhea -dry or itchy skin -hair loss -headache -loss of appetite -sensitivity of eyes to the light -stomach upset -unusually teary eyes This list may not describe all possible side effects. Call your doctor for medical advice about side effects. You may report side effects to FDA at 1-800-FDA-1088. Where should I keep my medicine? This drug is given in a hospital or clinic and will not be stored at home. NOTE: This sheet is a summary. It may not cover all possible information. If you have questions about this medicine, talk to your doctor, pharmacist, or health care provider.  2014, [employer] Standard. (2007-11-07 13:53:16) Epirubicin injection What is this medicine? EPIRUBICIN (ep i ROO bi sin) is a  chemotherapy drug. This medicine is used to treat breast cancer. This medicine may be used for other purposes; ask your health care provider or pharmacist if you have questions. COMMON BRAND NAME(S): Ellence What should I tell my health care provider before I take this medicine? They need to know if you have any of these conditions: -blood disorders -heart disease, recent heart attack -infection (especially a virus infection such as chickenpox, cold sores, or herpes) -irregular heartbeat -kidney disease -liver disease -recent or ongoing radiation therapy -an unusual or allergic reaction to epirubicin, other chemotherapy agents, other medicines, foods, dyes, or preservatives -pregnant or trying to get pregnant -breast-feeding How should I use this medicine? This drug is given as an infusion into a vein. It is administered in a hospital or clinic by a specially trained health care professional. If you have pain, swelling, burning or any unusual feeling around the site of your injection, tell your health care professional right away. Talk to your pediatrician regarding the use of this medicine in children. Special care may be needed. Overdosage: If you think you have taken too much of this medicine contact a poison control center or emergency room at once. NOTE: This medicine is only for you. Do not share this medicine with others. What if I miss a dose? It is important not to miss your dose. Call your doctor or health care professional if you are unable to keep an appointment. What may interact  with this medicine? Do not take this medicine with any of the following medications: -cisapride -droperidol -halofantrine -pimozide This medicine may also interact with the following medications: -chloroquine -chlorpromazine -clarithromycin -cimetidine -cyclosporine -erythromycin -medicines for blood pressure like amlodipine, felodipine, nifedipine -medicines for depression, anxiety, or  psychotic disturbances -medicines for irregular heart beat like amiodarone, bepridil, dofetilide, encainide, flecainide, propafenone, quinidine -medicines for nausea, vomiting like dolasetron, ondansetron, palonosetron -medicines to increase blood counts like filgrastim, pegfilgrastim, sargramostim -methadone -methotrexate -pentamidine -vaccines Talk to your doctor or health care professional before taking any of these medicines: -acetaminophen -aspirin -ibuprofen -ketoprofen -naproxen This list may not describe all possible interactions. Give your health care provider a list of all the medicines, herbs, non-prescription drugs, or dietary supplements you use. Also tell them if you smoke, drink alcohol, or use illegal drugs. Some items may interact with your medicine. What should I watch for while using this medicine? Your condition will be monitored carefully while you are receiving this medicine. You will need important blood work done while you are taking this medicine. This drug may make you feel generally unwell. This is not uncommon, as chemotherapy can affect healthy cells as well as cancer cells. Report any side effects. Continue your course of treatment even though you feel ill unless your doctor tells you to stop. Your urine may turn red for a few days after your dose. This is not blood. If your urine is dark or brown, call your doctor. In some cases, you may be given additional medicines to help with side effects. Follow all directions for their use. Call your doctor or health care professional for advice if you get a fever, chills or sore throat, or other symptoms of a cold or flu. Do not treat yourself. This drug decreases your body's ability to fight infections. Try to avoid being around people who are sick. This medicine may increase your risk to bruise or bleed. Call your doctor or health care professional if you notice any unusual bleeding. Be careful brushing and flossing your  teeth or using a toothpick because you may get an infection or bleed more easily. If you have any dental work done, tell your dentist you are receiving this medicine. Avoid taking products that contain aspirin, acetaminophen, ibuprofen, naproxen, or ketoprofen unless instructed by your doctor. These medicines may hide a fever. Men and women of childbearing age should use effective birth control methods while using taking this medicine. Do not become pregnant while taking this medicine. There is a potential for serious side effects to an unborn child. Talk to your health care professional or pharmacist for more information. Do not breast-feed an infant while taking this medicine. There is a maximum amount of this medicine you should receive throughout your life. The amount depends on the medical condition being treated and your overall health. Your doctor will watch how much of this medicine you receive in your lifetime. Tell your doctor if you have taken this medicine before. What side effects may I notice from receiving this medicine? Side effects that you should report to your doctor or health care professional as soon as possible: -allergic reactions like skin rash, itching or hives, swelling of the face, lips, or tongue -low blood counts - this medicine may decrease the number of white blood cells, red blood cells and platelets. You may be at increased risk for infections and bleeding. -signs of infection - fever or chills, cough, sore throat, pain or difficulty passing urine -signs of decreased platelets  or bleeding - bruising, pinpoint red spots on the skin, black, tarry stools, blood in the urine -signs of decreased red blood cells - unusually weak or tired, fainting spells, lightheadedness -breathing problems -chest pain -gout pain -fast, irregular heartbeat -mouth sores -pain, swelling, redness at site where injected -swelling of ankles, feet, or hands Side effects that usually do not  require medical attention (report to your doctor or health care professional if they continue or are bothersome): -changes in skin or nail color -diarrhea -hair loss -hot flashes, facial flushing -increased skin sensitivity to the sun -loss of appetite -nausea, vomiting -red colored urine -stomach upset This list may not describe all possible side effects. Call your doctor for medical advice about side effects. You may report side effects to FDA at 1-800-FDA-1088. Where should I keep my medicine? This drug is given in a hospital or clinic and will not be stored at home. NOTE: This sheet is a summary. It may not cover all possible information. If you have questions about this medicine, talk to your doctor, pharmacist, or health care provider.  2014, [employer] Standard. (2012-10-30 12:08:28)    If you develop nausea and vomiting that is not controlled by your nausea medication, call the clinic.   BELOW ARE SYMPTOMS THAT SHOULD BE REPORTED IMMEDIATELY:  *FEVER GREATER THAN 100.5 F  *CHILLS WITH OR WITHOUT FEVER  NAUSEA AND VOMITING THAT IS NOT CONTROLLED WITH YOUR NAUSEA MEDICATION  *UNUSUAL SHORTNESS OF BREATH  *UNUSUAL BRUISING OR BLEEDING  TENDERNESS IN MOUTH AND THROAT WITH OR WITHOUT PRESENCE OF ULCERS  *URINARY PROBLEMS  *BOWEL PROBLEMS  UNUSUAL RASH Items with * indicate a potential emergency and should be followed up as soon as possible.  Feel free to call the clinic you have any questions or concerns. The clinic phone number is [phone].  Take compazine 10 mg orally tonight even if not nauseated & may take every 6 hours as needed for nausea. Take dexamethasone 4mg  x 2 tabs = 8 mg daily starting day after chemotherapy then 2 tabs twice daily with food x 2 days Take zofran 8 mg every 12 hours starting day 3 after chemo & cont as needed for nausea.

## 2013-05-24 NOTE — Patient Instructions (Signed)
Doing well.  Proceed with chemotherapy.  Start Claritin 10mg  and Tylenol 500mg  tomorrow morning.  Take daily for 5 days.    We will see you back next week.    Please call us if you have any questions or concerns.

## 2013-05-25 ENCOUNTER — Ambulatory Visit (HOSPITAL_BASED_OUTPATIENT_CLINIC_OR_DEPARTMENT_OTHER): Payer: Medicare Other

## 2013-05-25 ENCOUNTER — Ambulatory Visit: Payer: Medicare Other

## 2013-05-25 VITALS — BP 147/65 | HR 78 | Temp 97.1°F | Resp 18

## 2013-05-25 DIAGNOSIS — Z5189 Encounter for other specified aftercare: Secondary | ICD-10-CM

## 2013-05-25 DIAGNOSIS — C50919 Malignant neoplasm of unspecified site of unspecified female breast: Secondary | ICD-10-CM

## 2013-05-25 DIAGNOSIS — C50412 Malignant neoplasm of upper-outer quadrant of left female breast: Secondary | ICD-10-CM

## 2013-05-25 MED ORDER — PEGFILGRASTIM INJECTION 6 MG/0.6ML
6.0000 mg | Freq: Once | SUBCUTANEOUS | Status: AC
  Administered 2013-05-25: 6 mg via SUBCUTANEOUS

## 2013-05-25 NOTE — Patient Instructions (Signed)

## 2013-05-27 ENCOUNTER — Encounter: Payer: Self-pay | Admitting: Oncology

## 2013-05-27 NOTE — Progress Notes (Signed)
Faxed application to [facility] @ [phone].

## 2013-05-28 ENCOUNTER — Telehealth: Payer: Self-pay

## 2013-05-28 NOTE — Telephone Encounter (Signed)
Spoke with Kathy Maxwell and she is doing well after her treatment on 05-24-13. No n/v, diarrhea, or mouth sores.  She is tired.  Told her that this is to be expected after the treatment. She is not experiencing any aches from the neulasta injection.  She took tylenol and Claritin as directed. She will keep appointment with Larina Bras NP on 05-31-13 as scheduled.

## 2013-05-28 NOTE — Telephone Encounter (Signed)
Message copied by Lorine Bears on Tue May 28, 2013 11:59 AM ------      Message from: Sabino Snipes      Created: Fri May 24, 2013  2:16 PM      Regarding: Chemo f/u       1 st chemo epirubicin, 5FU, Ctx 05/24/13, neulasta 05/25/13/DrCindra Eves # [phone]. ------

## 2013-05-30 ENCOUNTER — Other Ambulatory Visit: Payer: Self-pay | Admitting: Family

## 2013-05-30 DIAGNOSIS — C50412 Malignant neoplasm of upper-outer quadrant of left female breast: Secondary | ICD-10-CM

## 2013-05-31 ENCOUNTER — Ambulatory Visit: Payer: Medicare Other | Admitting: Family

## 2013-05-31 ENCOUNTER — Other Ambulatory Visit: Payer: Medicare Other | Admitting: Lab

## 2013-05-31 ENCOUNTER — Other Ambulatory Visit (HOSPITAL_BASED_OUTPATIENT_CLINIC_OR_DEPARTMENT_OTHER): Payer: Medicare Other

## 2013-05-31 ENCOUNTER — Ambulatory Visit (HOSPITAL_BASED_OUTPATIENT_CLINIC_OR_DEPARTMENT_OTHER): Payer: Medicare Other | Admitting: Family

## 2013-05-31 ENCOUNTER — Encounter: Payer: Self-pay | Admitting: Family

## 2013-05-31 VITALS — BP 182/83 | HR 105 | Temp 98.3°F | Resp 18 | Ht 62.0 in | Wt 209.3 lb

## 2013-05-31 DIAGNOSIS — D0591 Unspecified type of carcinoma in situ of right breast: Secondary | ICD-10-CM

## 2013-05-31 DIAGNOSIS — C50412 Malignant neoplasm of upper-outer quadrant of left female breast: Secondary | ICD-10-CM

## 2013-05-31 DIAGNOSIS — C773 Secondary and unspecified malignant neoplasm of axilla and upper limb lymph nodes: Secondary | ICD-10-CM

## 2013-05-31 DIAGNOSIS — C50919 Malignant neoplasm of unspecified site of unspecified female breast: Secondary | ICD-10-CM

## 2013-05-31 DIAGNOSIS — D702 Other drug-induced agranulocytosis: Secondary | ICD-10-CM

## 2013-05-31 LAB — CBC WITH DIFFERENTIAL/PLATELET
BASO%: 1.3 % (ref 0.0–2.0)
Basophils Absolute: 0 10*3/uL (ref 0.0–0.1)
EOS%: 12.7 % — ABNORMAL HIGH (ref 0.0–7.0)
HCT: 30.9 % — ABNORMAL LOW (ref 34.8–46.6)
HGB: 10 g/dL — ABNORMAL LOW (ref 11.6–15.9)
MCH: 29.8 pg (ref 25.1–34.0)
MCHC: 32.4 g/dL (ref 31.5–36.0)
MCV: 92 fL (ref 79.5–101.0)
MONO%: 4.7 % (ref 0.0–14.0)
NEUT#: 0.2 10*3/uL — CL (ref 1.5–6.5)
NEUT%: 12.6 % — ABNORMAL LOW (ref 38.4–76.8)
lymph#: 1 10*3/uL (ref 0.9–3.3)

## 2013-05-31 LAB — COMPREHENSIVE METABOLIC PANEL (CC13)
ALT: 11 U/L (ref 0–55)
AST: 10 U/L (ref 5–34)
Alkaline Phosphatase: 101 U/L (ref 40–150)
BUN: 14 mg/dL (ref 7.0–26.0)
Calcium: 9.6 mg/dL (ref 8.4–10.4)
Chloride: 104 mEq/L (ref 98–109)
Creatinine: 0.8 mg/dL (ref 0.6–1.1)
Sodium: 136 mEq/L (ref 136–145)
Total Bilirubin: 0.39 mg/dL (ref 0.20–1.20)

## 2013-05-31 MED ORDER — CIPROFLOXACIN HCL 500 MG PO TABS: 500.0000 mg | ORAL_TABLET | Freq: Two times a day (BID) | ORAL | Status: DC

## 2013-05-31 NOTE — Patient Instructions (Addendum)
Please contact us at [phone] if you have any questions or concerns.  Please continue to do well and enjoy life!!!  Get plenty of rest, drink plenty of water, exercise daily (walking), eat a balanced diet.  Take vitamin D3 1000 IUs daily.   Complete monthly self-breast examinations.  Have a clinical breast exam by a physician every year.  Neutropenia Neutropenia is a condition that occurs when the level of a certain type of white blood cell (neutrophil) in your body becomes lower than normal. Neutrophils are made in the bone marrow and fight infections. These cells protect against bacteria and viruses. The fewer neutrophils you have, and the longer your body remains without them, the greater your risk of getting a severe infection becomes. CAUSES   The cause of neutropenia may be hard to determine. However, it is usually due to 3 main problems:   Decreased production of neutrophils. This may be due to:  Certain medicines such as chemotherapy.  Genetic problems.  Cancer.  Radiation treatments.  Vitamin deficiency.  Some pesticides.  Increased destruction of neutrophils. This may be due to:  Overwhelming infections.  Hemolytic anemia. This is when the body destroys its own blood cells.  Chemotherapy.  Neutrophils moving to areas of the body where they cannot fight infections. This may be due to:  Dialysis procedures.  Conditions where the spleen becomes enlarged. Neutrophils are held in the spleen and are not available to the rest of the body.  Overwhelming infections. The neutrophils are held in the area of the infection and are not available to the rest of the body.  SYMPTOMS  There are no specific symptoms of neutropenia. The lack of neutrophils can result in an infection, and an infection can cause various problems.  DIAGNOSIS  Diagnosis is made by a blood test. A complete blood count is performed. The normal level of neutrophils in human blood differs with  age and race. Infants have lower counts than older children and adults. African Americans have lower counts than Caucasians or Asians. The average adult level is 1500 cells/mm3 of blood. Neutrophil counts are interpreted as follows:  Greater than 1000 cells/mm3 gives normal protection against infection.  500 to 1000 cells/mm3 gives an increased risk for infection.  200 to 500 cells/mm3 is a greater risk for severe infection.  Lower than 200 cells/mm3 is a marked risk of infection. This may require hospitalization and treatment with antibiotic medicines.  TREATMENT  Treatment depends on the underlying cause, severity, and presence of infections or symptoms. It also depends on your health. Your caregiver will discuss the treatment plan with you. Mild cases are often easily treated and have a good outcome. Preventative measures may also be started to limit your risk of infections. Treatment can include:  Taking antibiotics.  Stopping medicines that are known to cause neutropenia.  Correcting nutritional deficiencies by eating green vegetables to supply folic acid and taking vitamin B supplements.  Stopping exposure to pesticides if your neutropenia is related to pesticide exposure.  Taking a blood growth factor called sargramostim, pegfilgrastim, or filgrastim if you are undergoing chemotherapy for cancer. This stimulates white blood cell production.  Removal of the spleen if you have Felty's syndrome and have repeated infections.  HOME CARE INSTRUCTIONS   Follow your caregiver's instructions about when you need to have blood work done.  Wash your hands often. Make sure others who come in contact with you also wash their hands.  Wash raw fruits and vegetables before eating  them. They can carry bacteria and fungi.  Avoid people with colds or spreadable (contagious) diseases (chickenpox, herpes zoster, influenza).  Avoid large crowds.  Avoid construction areas. The dust can release  fungus into the air.  Be cautious around children in daycare or school environments.  Take care of your respiratory system by coughing and deep breathing.  Bathe daily.  Protect your skin from cuts and burns.  Do not work in the garden or with flowers and plants.  Care for the mouth before and after meals by brushing with a soft toothbrush. If you have mucositis, do not use mouthwash. Mouthwash contains alcohol and can dry out the mouth even more.  Clean the area between the genitals and the anus (perineal area) after urination and bowel movements. Women need to wipe from front to back.  Use a water soluble lubricant during sexual intercourse and practice good hygiene after. Do not have intercourse if you are severely neutropenic. Check with your caregiver for guidelines.  Exercise daily as tolerated.  Avoid people who were vaccinated with a live vaccine in the past 30 days. You should not receive live vaccines (polio, typhoid).  Do not provide direct care for pets. Avoid animal droppings. Do not clean litter boxes and bird cages.  Do not share food utensils.  Do not use tampons, enemas, or rectal suppositories unless directed by your caregiver.  Use an electric razor to remove hair.  Wash your hands after handling magazines, letters, and newspapers.  SEEK IMMEDIATE MEDICAL CARE IF:   You have a fever.  You have chills or start to shake.  You feel nauseous or vomit.  You develop mouth sores.  You develop aches and pains.  You have redness and swelling around open wounds.  Your skin is warm to the touch.  You have pus coming from your wounds.  You develop swollen lymph nodes.  You feel weak or fatigued.  You develop red streaks on the skin.  MAKE SURE YOU:  Understand these instructions.  Will watch your condition.  Will get help right away if you are not doing well or get worse.  Document Released: 12/24/2001 Document Revised: 09/26/2011 Document  Reviewed: 01/21/2011 [facility] Patient Information 2014 [place], [place].

## 2013-05-31 NOTE — Progress Notes (Addendum)
[facility]  Telephone:([phone] [phone]  OFFICE PROGRESS NOTE   PATIENT: Kathy Maxwell   DOB: 26-Apr-1956  MR#: [id number]  JYN#:[id number]   QM:VHQIONG,EXBMW N, MD SU:  Kathy Kelp, MD   DIAGNOSIS: Kathy Maxwell is a 57 y.o. 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  Treated with adjuvant chemotherapy.   PRIOR THERAPY: 1.  Screening mammogram in 12/2012 was abnormal which led to left digital diagnostic mammogram and left breast ultrasound on 03/01/2013. Ultrasound of the left breast showed a hypoechoic mass at 12:30, 6 cm from the nipple, that measured 1.9 x 2.6 x 2.9 cm.  Ultrasound of the left  axilla demonstrates a 1.1 x 0.7 x 0.9 cm lymph node with a borderline thickened cortex.  2.  Left breast needle core biopsy at the 12:30 o'clock position and left needle core biopsy of the left axillary lymph node on 03/07/2013 showed invasive mammary carcinoma with features compatible with a grade 1 or 2 ductal carcinoma in the breast needle core biopsy and lymph node was positive for metastatic mammary carcinoma with a macrometastasis and extracapsular extension identified.  Estrogen receptor 100% positive, progesterone receptor 75% positive, Ki-67 15%, HER-2/neu by CISH no amplification.  3.  Bilateral breast MRI on 03/15/2013 showed in the left breast there were at least 4 focus of abnormal spiculated enhancement in the upper left breast centrally.  The entire area measured 7.4 cm x 6.6 cm x 5.1 cm.  The previous biopsy focus was posteriorly located and measured 2.1 x 1 x 2.1 cm.  The anterior focus that was not biopsied measured 3.1 x 2.6 x 2.6 cm.  A lateral focus that was not biopsied measured 1.1 x 1.4 x 1.6 cm.  There were  small medial focus measuring cyst 0.7 x 0.8 x 0.7 cm  Post biopsy changes were identified in the left axilla.  There were abnormal enlarged lymph nodes correlating to the patient's known metastatic neoplasm to the lymph nodes which the largest lymph node measured 1.8 x 0.8 cm.  In the right breast there was an irregular focus of 3.3 x 3.1 x 1.3 cm abnormal enhancement in the central right breast in the middle one third.  There was a mild thickened cortex and prominent lymph nodes within the right axilla with the largest measuring 1.6 x 1 cm.  There was a posterior central upper breast focus with associated clip artifact measuring 1.2 x 2.2 x 1 cm previously biopsied to be benign.  Suspicious findings in bilateral breasts.  4.  Anterior left breast needle core biopsy and central right breast needle core biopsy on 04/03/2013.  Left breast needle core biopsy showed invasive ductal carcinoma and ductal carcinoma in situ, 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% positive, progesterone receptor 23% positive, Ki-67 33%, and HER-2/neu by CISH no amplification.  Right breast needle core biopsy showed ductal carcinoma in situ, 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% positive, progesterone receptor 11% positive.  5.  Status post left breast modified radical mastectomy with left axillary lymph node resection and right breast lumpectomy with right axillary node biopsy on 04/22/2013 with pathology results as follows:  Left breast: Stage IIIA, pT3, pN2a, invasive ductal carcinoma with multiple foci, grade 1, largest span was 5.5 cm and ductal carcinoma in situ low-grade, 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%  positive, progesterone receptor 23% positive, HER-2/neu no amplification, Ki-67 33%, with 4/22 metastatic lymph nodes and 1 isolated tumor cells identified within left axillary lymph node resection.  Extracapsular extension was present and surgical resection margins were negative for carcinoma.  Right breast: Stage 0, pTis, pN0, 3.0 cm ductal carcinoma in situ with  calcifications, intermediate to high-grade, 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% positive, progesterone receptor 11% positive, with 0/4 metastatic right axillary lymph nodes.  Reexcision of right breast medial margin: Ductal carcinoma in situ, intermediate grade spanning 0.8 cm, ductal carcinoma in situ is focally less than 0.1 cm from the new medial margin, lobular neoplasia (atypical lobular hyperplasia).  6.  Referred for genetics counseling and testing - appointment scheduled for 06/20/2013.  7.  Adjuvant chemotherapy consisting of FEC (5-FU/epirubicin/Cytoxan)  that will be followed by adjuvant chemotherapy consisting of Taxol.  2-D echocardiogram on 04/18/2013 showed a left ventricular ejection fraction of 55% - 60%.   Started adjuvant chemotherapy with FEC on 05/24/2013.   CURRENT THERAPY:  Adjuvant chemotherapy consisting of Q14 day FEC (5-FU/epirubicin/Cytoxan) x 6 cycles that started on 05/24/2013 with Neulasta injection for granulocyte support on day 2 of every cycle.   INTERVAL HISTORY: Dr. Welton Flakes and I saw Kathy Maxwell for followup of invasive ductal carcinoma of the left breast and ductal carcinoma in situ of the right breast.  Since her last office visit and first adjuvant chemotherapy cycle of FEC on 05/24/2013, the patient reports that she tolerated chemotherapy fairly well.  She denies any nausea and any vomiting, but states she had fatigue that was relieved by rest and some insomnia.  Her blood pressure is elevated at 182/83 today, but she states she has not taken her blood pressure medications.  She also states she's been under personal stress as she is in the process of moving from [place], [place] to [place], [place].  She is scheduled to start physical therapy on 06/03/2013 for left upper extremity lymphedema.  Her interval history is otherwise stable.   PAST MEDICAL HISTORY: Past Medical History  Diagnosis Date  . Hyperlipidemia   . Hypertension   . Bipolar 1  disorder   . Cancer   . Hypertension 04/04/2013  . Hyperlipidemia 04/04/2013  . Bipolar 1 disorder 04/04/2013  . Breast cancer 03/10/13    left breast invasive ca  . Allergy     latex  . Obesity   . Diabetes mellitus without complication     NIDDM  . Sleep apnea     no cpap due to insurance  . Heart murmur   . GERD (gastroesophageal reflux disease)   . Coronary artery disease     PAST SURGICAL HISTORY: Past Surgical History  Procedure Laterality Date  . Cholecystectomy    . Breast biopsy Left 05/08/09    fibroadenoma with microcalcifications,no malignancy id  . Breast biopsy Left 03/07/13    invasive ca,1 lymph node metastatic  . 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 Abdominal hysterectomy    . Coronary stent placement  2001  . Cardiac catheterization    . Eye surgery  6/13,9/14    laser,cataract lft  . Coronary angioplasty      LAD stent 2003  . Portacath placement Right 04/22/2013    Procedure: INSERTION PORT-A-CATH;  Surgeon: [name], MD;  Location: [facility] OR;  Service: General;  Laterality: Right;  . Mastectomy modified radical Left 04/22/2013    Procedure: MASTECTOMY MODIFIED RADICAL;  Surgeon: Mikey Bussing  Grayce Sessions, MD;  Location: [facility] OR;  Service: General;  Laterality: Left;  . Breast lumpectomy with needle localization and axillary sentinel lymph node bx Right 04/22/2013    Procedure: BREAST LUMPECTOMY WITH NEEDLE LOCALIZATION AND AXILLARY SENTINEL LYMPH NODE BX;  Surgeon: [name], MD;  Location: [facility] OR;  Service: General;  Laterality: Right;    FAMILY HISTORY: Family History  Problem Relation Age of Onset  . Heart disease Mother   . Cancer Maternal Aunt 69    breast cancer  . Cancer Cousin 26    breast cancer    SOCIAL HISTORY: History  Substance Use Topics  . Smoking status: Former Smoker -- 1.00 packs/day for 15 years    Types: Cigarettes    Quit date: 07/19/1987  . Smokeless tobacco: Never  Used  . Alcohol Use: No    ALLERGIES: Allergies  Allergen Reactions  . Adhesive [Tape] Rash    Clear tape gloves ,elastic no problem     MEDICATIONS:  Current Outpatient Prescriptions  Medication Sig Dispense Refill  . amLODipine (NORVASC) 10 MG tablet Take 10 mg by mouth daily.      . Azilsartan-Chlorthalidone 40-25 MG TABS Take 1 tablet by mouth daily.       [name] buPROPion (WELLBUTRIN SR) 150 MG 12 hr tablet Take 150 mg by mouth daily.       . ciprofloxacin (CIPRO) 500 MG tablet Take 1 tablet (500 mg total) by mouth 2 (two) times daily.  14 tablet  2  . dexamethasone (DECADRON) 4 MG tablet Take 2 tablets by mouth once a day on the day after chemotherapy and then take 2 tablets two times a day for 2 days. Take with food.  30 tablet  1  . hydrALAZINE (APRESOLINE) 25 MG tablet Take 25 mg by mouth 2 (two) times daily.      [name] HYDROcodone-acetaminophen (NORCO/VICODIN) 5-325 MG per tablet Take 1-2 tablets by mouth every 4 (four) hours as needed for pain.  30 tablet  0  . Lancet Devices (EASY TOUCH LANCING DEVICE) MISC       . lidocaine-prilocaine (EMLA) cream Apply topically as needed.  30 g  0  . LITE TOUCH LANCETS MISC       . LORazepam (ATIVAN) 0.5 MG tablet Take 1 tablet (0.5mg ) every 6 hours as needed for nausea or vomiting.  30 tablet  0  . losartan (COZAAR) 100 MG tablet Take 100 mg by mouth daily.       . ondansetron (ZOFRAN) 8 MG tablet Take 1 tablet (8mg s) every 12 hours as needed for nausea or vomiting starting the 3rd day after chemotherapy.  30 tablet  1  . ONE TOUCH ULTRA TEST test strip       . pantoprazole (PROTONIX) 40 MG tablet Take 40 mg by mouth daily.      . pioglitazone-metformin (ACTOPLUS MET) 15-850 MG per tablet Take 1 tablet by mouth 2 (two) times daily with a meal.      . prochlorperazine (COMPAZINE) 10 MG tablet Take 1 tablet (10 mg total) by mouth every 6 (six) hours as needed (Nausea or vomiting).  30 tablet  1  . rosuvastatin (CRESTOR) 5 MG tablet Take 5 mg by  mouth daily.      . sitaGLIPtin (JANUVIA) 100 MG tablet Take 100 mg by mouth daily.      . metoprolol succinate (TOPROL-XL) 25 MG 24 hr tablet Take 25 mg by mouth daily.       No  current facility-administered medications for this visit.   Facility-Administered Medications Ordered in Other Visits  Medication Dose Route Frequency Provider Last Rate Last Dose  . sodium chloride 0.9 % injection 10 mL  10 mL Intravenous PRN Victorino December, MD   10 mL at 05/24/13 1522      REVIEW OF SYSTEMS: A 10 point review of systems was completed and is negative except as noted above. Kathy Maxwell denies any other symptomatology including ongoing fatigue, fever or chills, headache, vision changes, swollen glands, cough or shortness of breath, chest pain or discomfort, nausea, vomiting, diarrhea, constipation, change in urinary or bowel habits, arthralgias/myalgias, unusual bleeding/bruising or any other symptomatology.   PHYSICAL EXAMINATION: BP 182/83  Pulse 105  Temp(Src) 98.3 F (36.8 C) (Oral)  Resp 18  Ht 5\' 2"  (1.575 m)  Wt 209 lb 4.8 oz (94.938 kg)  BMI 38.27 kg/m2   ECOG FS:  1 - Symptomatic but completely ambulatory  General appearance: Alert, cooperative, well nourished, no apparent distress Head: Normocephalic, chemotherapy induced alopecia atraumatic, missing dentition Eyes: Conjunctivae/corneas clear, PERRLA, EOMI Nose: Nares, septum and mucosa are normal, no drainage or sinus tenderness Neck: No adenopathy, supple, symmetrical, trachea midline, no tenderness Resp: Clear to auscultation bilaterally, no wheezes/rales/rhonchi Cardio: Regular rate and rhythm, S1, S2 normal, 2/6 murmur, no click, rub or gallop, left upper extremity lymphedema, right Port-A-Cath without signs of infection Breasts:  Deferred, but the left breast is visibly absent GI: Soft, distended, non-tender, hypoactive bowel sounds, no organomegaly Skin: No rashes/lesions, skin warm and dry, no erythematous areas, no  cyanosis  M/S:  Atraumatic, normal strength in all extremities, normal range of motion, no clubbing  Lymph nodes: Cervical, supraclavicular, and axillary nodes normal Neurologic: Grossly normal, cranial nerves II through XII intact, alert and oriented x 3 Psych: Appropriate affect   LAB RESULTS: Lab Results  Component Value Date   WBC 1.5* 05/31/2013   NEUTROABS 0.2* 05/31/2013   HGB 10.0* 05/31/2013   HCT 30.9* 05/31/2013   MCV 92.0 05/31/2013   PLT 163 05/31/2013      Chemistry      Component Value Date/Time   NA 136 05/31/2013 1033   NA 134* 04/24/2013 0550   K 4.6 05/31/2013 1033   K 3.8 04/24/2013 0550   CL 101 04/24/2013 0550   CO2 24 05/31/2013 1033   CO2 25 04/24/2013 0550   BUN 14.0 05/31/2013 1033   BUN 19 04/24/2013 0550   CREATININE 0.8 05/31/2013 1033   CREATININE 0.98 04/24/2013 0550      Component Value Date/Time   CALCIUM 9.6 05/31/2013 1033   CALCIUM 8.3* 04/24/2013 0550   ALKPHOS 101 05/31/2013 1033   AST 10 05/31/2013 1033   ALT 11 05/31/2013 1033   BILITOT 0.39 05/31/2013 1033       No results found for this basename: [id number]    No components found with this basename: [id number]     RADIOGRAPHIC STUDIES: No results found.    ASSESSMENT:  Kathy Maxwell is a  57 y.o. woman:  1.  Status post left breast modified radical mastectomy with left axillary lymph node resection and right breast lumpectomy with right axillary node biopsy on 04/22/2013 with pathology results as follows:  Left breast: Stage IIIA, pT3, pN2a, invasive ductal carcinoma with multiple foci, grade 1, largest span was 5.5 cm and ductal carcinoma in situ low-grade, 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% positive, progesterone receptor 23% positive, HER-2/neu no amplification, Ki-67 33%, with 4/22 metastatic lymph nodes and  1 isolated tumor cells identified within left axillary lymph node resection.  Extracapsular extension was present and surgical resection margins were negative for  carcinoma.  Right breast: Stage 0, pTis, pN0, 3.0 cm ductal carcinoma in situ with calcifications, intermediate to high-grade, 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% positive, progesterone receptor 11% positive, with 0/4 metastatic right axillary lymph nodes.  Reexcision of right breast medial margin: Ductal carcinoma in situ, intermediate grade spanning 0.8 cm, ductal carcinoma in situ is focally less than 0.1 cm from the new medial margin, lobular neoplasia (atypical lobular hyperplasia).  2..  Referred for genetics counseling/testing - appointment scheduled for 06/20/2013.  3.  Adjuvant chemotherapy consisting of FEC (5-FU/epirubicin/Cytoxan)  that will be followed by adjuvant chemotherapy consisting of Taxol.  2-D echocardiogram on 04/18/2013 showed a left ventricular ejection fraction of 55% - 60%.   Started adjuvant chemotherapy with FEC on 05/24/2013.  4.  Chemotherapy induced neutropenia   PL AN: 1.  An electronic prescription for ciprofloxacin 500 mg take 1 tablet by mouth 2 times daily #14 with 2 refills was sent to the patient's pharmacy for neutropenia.  The patient was also provided literature outlining neutropenic precautions.  2.  We plan to see Kathy Maxwell again on 06/07/2013 for adjuvant chemotherapy cycle #2 of FEC, we will check laboratories of CBC and CMP at that time.  Day 2 Neulasta injection scheduled for 06/08/2013.  3.  Kathy Maxwell is scheduled for genetics counseling and possible testing on 06/20/2013.  All questions were answered.  Kathy Maxwell was encouraged to contact us in the interim with any problems, questions or concerns.    Larina Bras, NP-C 06/04/2013    5:54 PM  ATTENDING'S ATTESTATION:  I personally reviewed patient's chart, examined patient myself, formulated the treatment plan as followed.    Overall patient is doing well she is tolerating her chemotherapy very nicely. She does become neutropenic with the FEC. She is now neutropenic but not febrile. We  will plan of giving her prophylactic antibiotics with Cipro 500 mg twice a day. She understands neutropenic precautions and knows to call us if she spikes a temperature or any other problems occur. She'll be seen back in one week's time for her next treatment.  Drue Second, MD Medical/Oncology [facility] [phone] (beeper) [phone] (Office)  06/19/2013, 9:27 AM

## 2013-06-03 ENCOUNTER — Ambulatory Visit: Payer: Medicare Other | Attending: General Surgery | Admitting: Physical Therapy

## 2013-06-03 DIAGNOSIS — I890 Lymphedema, not elsewhere classified: Secondary | ICD-10-CM | POA: Insufficient documentation

## 2013-06-03 DIAGNOSIS — IMO0001 Reserved for inherently not codable concepts without codable children: Secondary | ICD-10-CM | POA: Insufficient documentation

## 2013-06-03 DIAGNOSIS — M24519 Contracture, unspecified shoulder: Secondary | ICD-10-CM | POA: Insufficient documentation

## 2013-06-05 ENCOUNTER — Ambulatory Visit: Payer: Medicare Other | Admitting: Physical Therapy

## 2013-06-06 ENCOUNTER — Other Ambulatory Visit: Payer: Self-pay | Admitting: Family

## 2013-06-06 DIAGNOSIS — C50412 Malignant neoplasm of upper-outer quadrant of left female breast: Secondary | ICD-10-CM

## 2013-06-07 ENCOUNTER — Ambulatory Visit (HOSPITAL_BASED_OUTPATIENT_CLINIC_OR_DEPARTMENT_OTHER): Payer: Medicare Other

## 2013-06-07 ENCOUNTER — Other Ambulatory Visit (HOSPITAL_BASED_OUTPATIENT_CLINIC_OR_DEPARTMENT_OTHER): Payer: Medicare Other | Admitting: Lab

## 2013-06-07 ENCOUNTER — Encounter: Payer: Self-pay | Admitting: Family

## 2013-06-07 ENCOUNTER — Ambulatory Visit (HOSPITAL_BASED_OUTPATIENT_CLINIC_OR_DEPARTMENT_OTHER): Payer: Medicare Other | Admitting: Family

## 2013-06-07 VITALS — BP 118/66 | HR 83 | Temp 98.5°F | Resp 18 | Ht 62.0 in | Wt 208.2 lb

## 2013-06-07 DIAGNOSIS — C773 Secondary and unspecified malignant neoplasm of axilla and upper limb lymph nodes: Secondary | ICD-10-CM

## 2013-06-07 DIAGNOSIS — C50412 Malignant neoplasm of upper-outer quadrant of left female breast: Secondary | ICD-10-CM

## 2013-06-07 DIAGNOSIS — I890 Lymphedema, not elsewhere classified: Secondary | ICD-10-CM

## 2013-06-07 DIAGNOSIS — C50919 Malignant neoplasm of unspecified site of unspecified female breast: Secondary | ICD-10-CM

## 2013-06-07 DIAGNOSIS — Z5111 Encounter for antineoplastic chemotherapy: Secondary | ICD-10-CM

## 2013-06-07 DIAGNOSIS — D0590 Unspecified type of carcinoma in situ of unspecified breast: Secondary | ICD-10-CM

## 2013-06-07 DIAGNOSIS — D0591 Unspecified type of carcinoma in situ of right breast: Secondary | ICD-10-CM

## 2013-06-07 DIAGNOSIS — Z170 Estrogen receptor positive status [ER+]: Secondary | ICD-10-CM

## 2013-06-07 LAB — CBC WITH DIFFERENTIAL/PLATELET
BASO%: 0.6 % (ref 0.0–2.0)
Basophils Absolute: 0.1 10*3/uL (ref 0.0–0.1)
EOS%: 0.3 % (ref 0.0–7.0)
HCT: 31.3 % — ABNORMAL LOW (ref 34.8–46.6)
LYMPH%: 22.4 % (ref 14.0–49.7)
MCH: 30.5 pg (ref 25.1–34.0)
MCHC: 32.3 g/dL (ref 31.5–36.0)
MCV: 94.6 fL (ref 79.5–101.0)
MONO#: 1.1 10*3/uL — ABNORMAL HIGH (ref 0.1–0.9)
MONO%: 8.9 % (ref 0.0–14.0)
NEUT%: 67.8 % (ref 38.4–76.8)
Platelets: 227 10*3/uL (ref 145–400)
RBC: 3.31 10*6/uL — ABNORMAL LOW (ref 3.70–5.45)
WBC: 12.6 10*3/uL — ABNORMAL HIGH (ref 3.9–10.3)

## 2013-06-07 LAB — COMPREHENSIVE METABOLIC PANEL (CC13)
ALT: 7 U/L (ref 0–55)
Alkaline Phosphatase: 96 U/L (ref 40–150)
Anion Gap: 8 mEq/L (ref 3–11)
BUN: 16.6 mg/dL (ref 7.0–26.0)
CO2: 24 mEq/L (ref 22–29)
Creatinine: 1 mg/dL (ref 0.6–1.1)
Sodium: 137 mEq/L (ref 136–145)
Total Bilirubin: 0.2 mg/dL (ref 0.20–1.20)
Total Protein: 6.6 g/dL (ref 6.4–8.3)

## 2013-06-07 MED ORDER — EPIRUBICIN HCL CHEMO IV INJECTION 200 MG/100ML
100.0000 mg/m2 | Freq: Once | INTRAVENOUS | Status: AC
  Administered 2013-06-07: 200 mg via INTRAVENOUS
  Filled 2013-06-07: qty 100

## 2013-06-07 MED ORDER — DEXAMETHASONE SODIUM PHOSPHATE 20 MG/5ML IJ SOLN
12.0000 mg | Freq: Once | INTRAMUSCULAR | Status: AC
  Administered 2013-06-07: 12 mg via INTRAVENOUS

## 2013-06-07 MED ORDER — DEXAMETHASONE SODIUM PHOSPHATE 20 MG/5ML IJ SOLN
INTRAMUSCULAR | Status: AC
  Filled 2013-06-07: qty 5

## 2013-06-07 MED ORDER — PALONOSETRON HCL INJECTION 0.25 MG/5ML
0.2500 mg | Freq: Once | INTRAVENOUS | Status: AC
  Administered 2013-06-07: 0.25 mg via INTRAVENOUS

## 2013-06-07 MED ORDER — SODIUM CHLORIDE 0.9 % IJ SOLN
10.0000 mL | INTRAMUSCULAR | Status: DC | PRN
  Administered 2013-06-07: 10 mL
  Filled 2013-06-07: qty 10

## 2013-06-07 MED ORDER — SODIUM CHLORIDE 0.9 % IV SOLN
150.0000 mg | Freq: Once | INTRAVENOUS | Status: AC
  Administered 2013-06-07: 150 mg via INTRAVENOUS
  Filled 2013-06-07: qty 5

## 2013-06-07 MED ORDER — SODIUM CHLORIDE 0.9 % IV SOLN
Freq: Once | INTRAVENOUS | Status: AC
  Administered 2013-06-07: 12:00:00 via INTRAVENOUS

## 2013-06-07 MED ORDER — CYCLOPHOSPHAMIDE CHEMO INJECTION 1 GM
500.0000 mg/m2 | Freq: Once | INTRAMUSCULAR | Status: AC
  Administered 2013-06-07: 1000 mg via INTRAVENOUS
  Filled 2013-06-07: qty 50

## 2013-06-07 MED ORDER — PALONOSETRON HCL INJECTION 0.25 MG/5ML
INTRAVENOUS | Status: AC
  Filled 2013-06-07: qty 5

## 2013-06-07 MED ORDER — FLUOROURACIL CHEMO INJECTION 2.5 GM/50ML
500.0000 mg/m2 | Freq: Once | INTRAVENOUS | Status: AC
  Administered 2013-06-07: 1000 mg via INTRAVENOUS
  Filled 2013-06-07: qty 20

## 2013-06-07 MED ORDER — HEPARIN SOD (PORK) LOCK FLUSH 100 UNIT/ML IV SOLN
500.0000 [IU] | Freq: Once | INTRAVENOUS | Status: AC | PRN
  Administered 2013-06-07: 500 [IU]
  Filled 2013-06-07: qty 5

## 2013-06-07 NOTE — Patient Instructions (Signed)
Please contact us at [phone] if you have any questions or concerns.  Please continue to do well and enjoy life!!!  Get plenty of rest, drink plenty of water, exercise daily (walking), eat a balanced diet.  Take vitamin D3 1000 IUs daily.   Complete monthly self-breast examinations.  Have a clinical breast exam by a physician every year.  Results for orders placed in visit on 06/07/13 (from the past 24 hour(s))  CBC WITH DIFFERENTIAL     Status: Abnormal   Collection Time    06/07/13  9:14 AM      Result Value Range   WBC 12.6 (*) 3.9 - 10.3 10e3/uL   NEUT# 8.6 (*) 1.5 - 6.5 10e3/uL   HGB 10.1 (*) 11.6 - 15.9 g/dL   HCT 16.1 (*) 09.6 - 04.5 %   Platelets 227  145 - 400 10e3/uL   MCV 94.6  79.5 - 101.0 fL   MCH 30.5  25.1 - 34.0 pg   MCHC 32.3  31.5 - 36.0 g/dL   RBC 4.09 (*) 8.11 - 9.14 10e6/uL   RDW 14.3  11.2 - 14.5 %   lymph# 2.8  0.9 - 3.3 10e3/uL   MONO# 1.1 (*) 0.1 - 0.9 10e3/uL   Eosinophils Absolute 0.0  0.0 - 0.5 10e3/uL   Basophils Absolute 0.1  0.0 - 0.1 10e3/uL   NEUT% 67.8  38.4 - 76.8 %   LYMPH% 22.4  14.0 - 49.7 %   MONO% 8.9  0.0 - 14.0 %   EOS% 0.3  0.0 - 7.0 %   BASO% 0.6  0.0 - 2.0 %   Narrative:    Performed At:  [facility]               [address]               [place], [place] [address]

## 2013-06-07 NOTE — Progress Notes (Signed)
PAC in right chest is difficult to access.  Two attempts needed to access port. 2nd attempt was successful and blood drawn for labs through the port.

## 2013-06-07 NOTE — Progress Notes (Signed)
[facility]  [phone] [phone]  OFFICE PROGRESS NOTE    PATIENT: Kathy Maxwell   DOB: 1955-11-17  MR#: [id number]  JYN#:[id number]   QM:[id number] N, MD SU:  Claud Kelp, MD   DIAGNOSIS: Kathy Maxwell is a 57 y.o. 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  Treated with adjuvant chemotherapy.   PRIOR THERAPY: 1.  Screening mammogram in 12/2012 was abnormal which led to left digital diagnostic mammogram and left breast ultrasound on 03/01/2013. Ultrasound of the left breast showed a hypoechoic mass at 12:30, 6 cm from the nipple, that measured 1.9 x 2.6 x 2.9 cm.  Ultrasound of the left axilla demonstrates a 1.1 x 0.7 x 0.9 cm lymph node with a borderline thickened cortex.  2.  Left breast needle core biopsy at the 12:30 o'clock position and left needle core biopsy of the left axillary lymph node on 03/07/2013 showed invasive mammary carcinoma with features compatible with a grade 1 or 2 ductal carcinoma in the breast needle core biopsy and lymph node was positive for metastatic mammary carcinoma with a macrometastasis and extracapsular extension identified.  Estrogen receptor 100% positive, progesterone receptor 75% positive, Ki-67 15%, HER-2/neu by CISH no amplification.  3.  Bilateral breast MRI on 03/15/2013 showed in the left breast there were at least 4 focus of abnormal spiculated enhancement in the upper left breast centrally.  The entire area measured 7.4 cm x 6.6 cm x 5.1 cm.  The previous biopsy focus was posteriorly located and measured 2.1 x 1 x 2.1 cm.  The anterior focus that was not biopsied measured 3.1 x 2.6 x 2.6 cm.  A lateral focus that was not biopsied measured 1.1 x 1.4 x 1.6 cm.  There were  small medial focus measuring cyst 0.7 x 0.8 x 0.7 cm  Post biopsy changes were identified in the left axilla.  There were abnormal enlarged lymph nodes correlating to the patient's known metastatic neoplasm to the lymph nodes which the largest lymph node measured 1.8 x 0.8 cm.  In the right breast there was an irregular focus of 3.3 x 3.1 x 1.3 cm abnormal enhancement in the central right breast in the middle one third.  There was a mild thickened cortex and prominent lymph nodes within the right axilla with the largest measuring 1.6 x 1 cm.  There was a posterior central upper breast focus with associated clip artifact measuring 1.2 x 2.2 x 1 cm previously biopsied to be benign.  Suspicious findings in bilateral breasts.  4.  Anterior left breast needle core biopsy and central right breast needle core biopsy on 04/03/2013.  Left breast needle core biopsy showed invasive ductal carcinoma and ductal carcinoma in situ, 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% positive, progesterone receptor 23% positive, Ki-67 33%, and HER-2/neu by CISH no amplification.  Right breast needle core biopsy showed ductal carcinoma in situ, 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% positive, progesterone receptor 11% positive.  5.  Status post left breast modified radical mastectomy with left axillary lymph node resection and right breast lumpectomy with right axillary node biopsy on 04/22/2013 with pathology results as follows:  Left breast: Stage IIIA, pT3, pN2a, invasive ductal carcinoma with multiple foci, grade 1, largest span was 5.5 cm and ductal carcinoma in situ low-grade, 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% positive,  progesterone receptor 23% positive, HER-2/neu no amplification, Ki-67 33%, with 4/22 metastatic lymph nodes and 1 isolated tumor cells identified within left axillary lymph node resection.  Extracapsular extension was present and surgical resection margins were negative for carcinoma.  Right breast: Stage 0, pTis, pN0, 3.0 cm ductal carcinoma in situ with  calcifications, intermediate to high-grade, 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% positive, progesterone receptor 11% positive, with 0/4 metastatic right axillary lymph nodes.  Reexcision of right breast medial margin: Ductal carcinoma in situ, intermediate grade spanning 0.8 cm, ductal carcinoma in situ is focally less than 0.1 cm from the new medial margin, lobular neoplasia (atypical lobular hyperplasia).  6.  Referred for genetics counseling and testing - appointment scheduled for 06/20/2013.  7.  Adjuvant chemotherapy consisting of FEC (5-FU/epirubicin/Cytoxan)  that will be followed by adjuvant chemotherapy consisting of Taxol.  2-D echocardiogram on 04/18/2013 showed a left ventricular ejection fraction of 55% - 60%.   Started adjuvant chemotherapy with FEC on 05/24/2013.   CURRENT THERAPY:  Adjuvant chemotherapy consisting of Q14 day FEC (5-FU/epirubicin/Cytoxan) x 6 cycles that started on 05/24/2013 with Neulasta injection for granulocyte support on day 2 of every cycle.   INTERVAL HISTORY: Kathy Maxwell was seen today for followup of invasive ductal carcinoma of the left breast and ductal carcinoma in situ of the right breast.  Since her last office visit on 05/31/2013, the patient reports that she is tolerating chemotherapy fairly well.  She denies any nausea, vomiting, fatigue and states that she is doing well overall.  Her interval history is otherwise stable and unremarkable.   PAST MEDICAL HISTORY: Past Medical History  Diagnosis Date  . Hyperlipidemia   . Hypertension   . Bipolar 1 disorder   . Cancer   . Hypertension 04/04/2013  . Hyperlipidemia 04/04/2013  . Bipolar 1 disorder 04/04/2013  . Breast cancer 03/10/13    left breast invasive ca  . Allergy     latex  . Obesity   . Diabetes mellitus without complication     NIDDM  . Sleep apnea     no cpap due to insurance  . Heart murmur   . GERD (gastroesophageal reflux disease)   . Coronary artery disease     PAST SURGICAL  HISTORY: Past Surgical History  Procedure Laterality Date  . Cholecystectomy    . Breast biopsy Left 05/08/09    fibroadenoma with microcalcifications,no malignancy id  . Breast biopsy Left 03/07/13    invasive ca,1 lymph node metastatic  . 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 Abdominal hysterectomy    . Coronary stent placement  2001  . Cardiac catheterization    . Eye surgery  6/13,9/14    laser,cataract lft  . Coronary angioplasty      LAD stent 2003  . Portacath placement Right 04/22/2013    Procedure: INSERTION PORT-A-CATH;  Surgeon: [name], MD;  Location: [facility] OR;  Service: General;  Laterality: Right;  . Mastectomy modified radical Left 04/22/2013    Procedure: MASTECTOMY MODIFIED RADICAL;  Surgeon: [name], MD;  Location: [facility] OR;  Service: General;  Laterality: Left;  . Breast lumpectomy with needle localization and axillary sentinel lymph node bx Right 04/22/2013    Procedure: BREAST LUMPECTOMY WITH NEEDLE LOCALIZATION AND AXILLARY SENTINEL LYMPH NODE BX;  Surgeon: [name], MD;  Location: [facility] OR;  Service: General;  Laterality: Right;    FAMILY HISTORY: Family History  Problem Relation Age of Onset  .  Heart disease Mother   . Cancer Maternal Aunt 69    breast cancer  . Cancer Cousin 57    breast cancer    SOCIAL HISTORY: History  Substance Use Topics  . Smoking status: Former Smoker -- 1.00 packs/day for 15 years    Types: Cigarettes    Quit date: 07/19/1987  . Smokeless tobacco: Never Used  . Alcohol Use: No    ALLERGIES: Allergies  Allergen Reactions  . Adhesive [Tape] Rash    Clear tape gloves ,elastic no problem     MEDICATIONS:  Current Outpatient Prescriptions  Medication Sig Dispense Refill  . amLODipine (NORVASC) 10 MG tablet Take 10 mg by mouth daily.      . Azilsartan-Chlorthalidone 40-25 MG TABS Take 1 tablet by mouth daily.       [name] buPROPion (WELLBUTRIN SR) 150 MG  12 hr tablet Take 150 mg by mouth daily.       [name] dexamethasone (DECADRON) 4 MG tablet Take 2 tablets by mouth once a day on the day after chemotherapy and then take 2 tablets two times a day for 2 days. Take with food.  30 tablet  1  . hydrALAZINE (APRESOLINE) 25 MG tablet Take 25 mg by mouth 2 (two) times daily.      [name] HYDROcodone-acetaminophen (NORCO/VICODIN) 5-325 MG per tablet Take 1-2 tablets by mouth every 4 (four) hours as needed for pain.  30 tablet  0  . Lancet Devices (EASY TOUCH LANCING DEVICE) MISC       . lidocaine-prilocaine (EMLA) cream Apply topically as needed.  30 g  0  . LITE TOUCH LANCETS MISC       . LORazepam (ATIVAN) 0.5 MG tablet Take 1 tablet (0.5mg ) every 6 hours as needed for nausea or vomiting.  30 tablet  0  . losartan (COZAAR) 100 MG tablet Take 100 mg by mouth daily.       . metoprolol succinate (TOPROL-XL) 25 MG 24 hr tablet Take 25 mg by mouth daily.      . ondansetron (ZOFRAN) 8 MG tablet Take 1 tablet (8mg s) every 12 hours as needed for nausea or vomiting starting the 3rd day after chemotherapy.  30 tablet  1  . ONE TOUCH ULTRA TEST test strip       . pantoprazole (PROTONIX) 40 MG tablet Take 40 mg by mouth daily.      . pioglitazone-metformin (ACTOPLUS MET) 15-850 MG per tablet Take 1 tablet by mouth 2 (two) times daily with a meal.      . prochlorperazine (COMPAZINE) 10 MG tablet Take 1 tablet (10 mg total) by mouth every 6 (six) hours as needed (Nausea or vomiting).  30 tablet  1  . rosuvastatin (CRESTOR) 5 MG tablet Take 5 mg by mouth daily.      . sitaGLIPtin (JANUVIA) 100 MG tablet Take 100 mg by mouth daily.      . ciprofloxacin (CIPRO) 500 MG tablet Take 1 tablet (500 mg total) by mouth 2 (two) times daily.  14 tablet  2   No current facility-administered medications for this visit.   Facility-Administered Medications Ordered in Other Visits  Medication Dose Route Frequency Provider Last Rate Last Dose  . sodium chloride 0.9 % injection 10 mL  10 mL  Intravenous PRN Victorino December, MD   10 mL at 05/24/13 1522      REVIEW OF SYSTEMS: A 10 point review of systems was completed and is negative except as noted above. Kathy Maxwell denies any other symptomatology including ongoing fatigue, fever or chills, headache, vision changes, swollen glands, cough or shortness of breath, chest pain or discomfort, nausea, vomiting, diarrhea, constipation, change in urinary or bowel habits, arthralgias/myalgias, unusual bleeding/bruising or any other symptomatology.   PHYSICAL EXAMINATION: BP 118/66  Pulse 83  Temp(Src) 98.5 F (36.9 C) (Oral)  Resp 18  Ht 5\' 2"  (1.575 m)  Wt 208 lb 3.2 oz (94.439 kg)  BMI 38.07 kg/m2  SpO2 97%   ECOG FS:  0 - Asymptomatic  General appearance: Alert, cooperative, well nourished, no apparent distress Head: Normocephalic, chemotherapy induced alopecia atraumatic, missing dentition, the patient is wearing a hat Eyes: Conjunctivae/corneas clear, PERRLA, EOMI, bilateral proptosis Nose: Nares, septum and mucosa are normal, no drainage or sinus tenderness Neck: No adenopathy, supple, symmetrical, trachea midline, no tenderness Resp: Clear to auscultation bilaterally, no wheezes/rales/rhonchi Cardio: Regular rate and rhythm, S1, S2 normal, 2/6 murmur, no click, rub or gallop, left upper extremity lymphedema, right Port-A-Cath without signs of infection Breasts:  Deferred, but the left breast is visibly absent GI: Soft, distended, non-tender, hypoactive bowel sounds, no organomegaly Skin: No rashes/lesions, skin warm and dry, no erythematous areas, no cyanosis  M/S:  Atraumatic, normal strength in all extremities, normal range of motion, no clubbing  Lymph nodes: Cervical, supraclavicular, and axillary nodes normal Neurologic: Grossly normal, cranial nerves II through XII intact, alert and oriented x 3 Psych: Appropriate affect   LAB RESULTS: Lab Results  Component Value Date   WBC 12.6* 06/07/2013   NEUTROABS  8.6* 06/07/2013   HGB 10.1* 06/07/2013   HCT 31.3* 06/07/2013   MCV 94.6 06/07/2013   PLT 227 06/07/2013      Chemistry      Component Value Date/Time   NA 137 06/07/2013 0916   NA 134* 04/24/2013 0550   K 4.0 06/07/2013 0916   K 3.8 04/24/2013 0550   CL 101 04/24/2013 0550   CO2 24 06/07/2013 0916   CO2 25 04/24/2013 0550   BUN 16.6 06/07/2013 0916   BUN 19 04/24/2013 0550   CREATININE 1.0 06/07/2013 0916   CREATININE 0.98 04/24/2013 0550      Component Value Date/Time   CALCIUM 9.6 06/07/2013 0916   CALCIUM 8.3* 04/24/2013 0550   ALKPHOS 96 06/07/2013 0916   AST 11 06/07/2013 0916   ALT 7 06/07/2013 0916   BILITOT 0.20 06/07/2013 0916       No results found for this basename: [id number]    No components found with this basename: [id number]     RADIOGRAPHIC STUDIES: 1.    2-D echocardiogram on 04/18/2013 showed a left ventricular ejection fraction of 55% - 60%    ASSESSMENT:  Kathy Maxwell is a  57 y.o. woman:  1.  Status post left breast modified radical mastectomy with left axillary lymph node resection and right breast lumpectomy with right axillary node biopsy on 04/22/2013 with pathology results as follows:  Left breast: Stage IIIA, pT3, pN2a, invasive ductal carcinoma with multiple foci, grade 1, largest span was 5.5 cm and ductal carcinoma in situ low-grade, 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% positive, progesterone receptor 23% positive, HER-2/neu no amplification, Ki-67 33%, with 4/22 metastatic lymph nodes and 1 isolated tumor cells identified within left axillary lymph node resection.  Extracapsular extension was present and surgical resection margins were negative for carcinoma.  Right breast: Stage 0, pTis, pN0, 3.0 cm ductal carcinoma in situ with calcifications, intermediate to high-grade, 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% positive, progesterone receptor 11% positive, with  0/4 metastatic right axillary lymph nodes.  Reexcision of right breast medial margin: Ductal carcinoma in situ,  intermediate grade spanning 0.8 cm, ductal carcinoma in situ is focally less than 0.1 cm from the new medial margin, lobular neoplasia (atypical lobular hyperplasia).  2..  Referred for genetics counseling/testing - appointment scheduled for 06/20/2013.  3.  Adjuvant chemotherapy consisting of FEC (5-FU/Epirubicin/Cytoxan)  that will be followed by adjuvant chemotherapy consisting of Taxol.  2-D echocardiogram on 04/18/2013 showed a left ventricular ejection fraction of 55% - 60%.   Started adjuvant chemotherapy with FEC on 05/24/2013.  4.  Chemotherapy induced neutropenia - resolved   PLAN: 1.  Ms. Klich will proceed to adjuvant chemotherapy cycle #2 of FEC today with day 2 Neulasta injection scheduled for tomorrow, 06/08/2013.  2.  She is currently under treatment with [facility] clinic for left upper extremity lymphedema.  Her next physical therapy session is scheduled for 06/19/2013.  3.  Ms. Oliveria is scheduled for genetic counseling and testing on 06/20/2013.  3.  We plan to see Ms. Presswood again on 06/14/2013 to assess for any chemotherapy toxicity, physical examination and laboratory evaluation.  Cycle #4 of chemotherapy is scheduled for 06/21/2013 with Neulasta injection scheduled for 06/22/2013.  All questions were answered.  Ms. Meda was encouraged to contact us in the interim with any problems, questions or concerns  Larina Bras, NP-C 06/09/2013    5:12 PM

## 2013-06-07 NOTE — Patient Instructions (Signed)
[facility] Discharge Instructions for Patients Receiving Chemotherapy  Today you received the following chemotherapy agents cytoxan, epirubicin and adrucil.  To help prevent nausea and vomiting after your treatment, we encourage you to take your nausea medication compazine and ativan.  After 3 days you can use zofran.   If you develop nausea and vomiting that is not controlled by your nausea medication, call the clinic.   BELOW ARE SYMPTOMS THAT SHOULD BE REPORTED IMMEDIATELY:  *FEVER GREATER THAN 100.5 F  *CHILLS WITH OR WITHOUT FEVER  NAUSEA AND VOMITING THAT IS NOT CONTROLLED WITH YOUR NAUSEA MEDICATION  *UNUSUAL SHORTNESS OF BREATH  *UNUSUAL BRUISING OR BLEEDING  TENDERNESS IN MOUTH AND THROAT WITH OR WITHOUT PRESENCE OF ULCERS  *URINARY PROBLEMS  *BOWEL PROBLEMS  UNUSUAL RASH Items with * indicate a potential emergency and should be followed up as soon as possible.  Feel free to call the clinic you have any questions or concerns. The clinic phone number is [phone].

## 2013-06-08 ENCOUNTER — Ambulatory Visit (HOSPITAL_BASED_OUTPATIENT_CLINIC_OR_DEPARTMENT_OTHER): Payer: Medicare Other

## 2013-06-08 VITALS — BP 135/69 | HR 81 | Temp 98.0°F | Resp 16

## 2013-06-08 DIAGNOSIS — C50919 Malignant neoplasm of unspecified site of unspecified female breast: Secondary | ICD-10-CM

## 2013-06-08 DIAGNOSIS — C773 Secondary and unspecified malignant neoplasm of axilla and upper limb lymph nodes: Secondary | ICD-10-CM

## 2013-06-08 DIAGNOSIS — Z5189 Encounter for other specified aftercare: Secondary | ICD-10-CM

## 2013-06-08 DIAGNOSIS — C50412 Malignant neoplasm of upper-outer quadrant of left female breast: Secondary | ICD-10-CM

## 2013-06-08 MED ORDER — PEGFILGRASTIM INJECTION 6 MG/0.6ML
6.0000 mg | Freq: Once | SUBCUTANEOUS | Status: AC
  Administered 2013-06-08: 6 mg via SUBCUTANEOUS

## 2013-06-14 ENCOUNTER — Other Ambulatory Visit (HOSPITAL_BASED_OUTPATIENT_CLINIC_OR_DEPARTMENT_OTHER): Payer: Medicare Other | Admitting: Lab

## 2013-06-14 ENCOUNTER — Ambulatory Visit: Payer: Medicare Other | Admitting: Family

## 2013-06-14 ENCOUNTER — Telehealth: Payer: Self-pay | Admitting: *Deleted

## 2013-06-14 ENCOUNTER — Other Ambulatory Visit: Payer: Medicare Other | Admitting: Lab

## 2013-06-14 ENCOUNTER — Ambulatory Visit (HOSPITAL_BASED_OUTPATIENT_CLINIC_OR_DEPARTMENT_OTHER): Payer: Medicare Other | Admitting: Adult Health

## 2013-06-14 ENCOUNTER — Encounter: Payer: Self-pay | Admitting: Adult Health

## 2013-06-14 VITALS — BP 158/73 | HR 92 | Temp 98.4°F | Resp 18 | Ht 62.0 in | Wt 206.5 lb

## 2013-06-14 DIAGNOSIS — C773 Secondary and unspecified malignant neoplasm of axilla and upper limb lymph nodes: Secondary | ICD-10-CM

## 2013-06-14 DIAGNOSIS — Z170 Estrogen receptor positive status [ER+]: Secondary | ICD-10-CM

## 2013-06-14 DIAGNOSIS — C50412 Malignant neoplasm of upper-outer quadrant of left female breast: Secondary | ICD-10-CM

## 2013-06-14 DIAGNOSIS — D0590 Unspecified type of carcinoma in situ of unspecified breast: Secondary | ICD-10-CM

## 2013-06-14 DIAGNOSIS — C50919 Malignant neoplasm of unspecified site of unspecified female breast: Secondary | ICD-10-CM

## 2013-06-14 DIAGNOSIS — R209 Unspecified disturbances of skin sensation: Secondary | ICD-10-CM

## 2013-06-14 LAB — COMPREHENSIVE METABOLIC PANEL (CC13)
AST: 8 U/L (ref 5–34)
Anion Gap: 9 mEq/L (ref 3–11)
BUN: 20.7 mg/dL (ref 7.0–26.0)
CO2: 27 mEq/L (ref 22–29)
Creatinine: 0.9 mg/dL (ref 0.6–1.1)
Glucose: 163 mg/dl — ABNORMAL HIGH (ref 70–140)
Total Bilirubin: 0.23 mg/dL (ref 0.20–1.20)

## 2013-06-14 LAB — CBC WITH DIFFERENTIAL/PLATELET
EOS%: 1 % (ref 0.0–7.0)
Eosinophils Absolute: 0 10*3/uL (ref 0.0–0.5)
HCT: 29.4 % — ABNORMAL LOW (ref 34.8–46.6)
HGB: 9.6 g/dL — ABNORMAL LOW (ref 11.6–15.9)
LYMPH%: 43.2 % (ref 14.0–49.7)
MCHC: 32.7 g/dL (ref 31.5–36.0)
MCV: 92.2 fL (ref 79.5–101.0)
MONO#: 0.2 10*3/uL (ref 0.1–0.9)
NEUT#: 0.9 10*3/uL — ABNORMAL LOW (ref 1.5–6.5)
NEUT%: 47 % (ref 38.4–76.8)
Platelets: 186 10*3/uL (ref 145–400)
WBC: 1.9 10*3/uL — ABNORMAL LOW (ref 3.9–10.3)
lymph#: 0.8 10*3/uL — ABNORMAL LOW (ref 0.9–3.3)

## 2013-06-14 NOTE — Patient Instructions (Addendum)
  Patient Neutropenia Instruction Sheet  Diagnosis: Breast Cancer      Treating Physician: Drue Second, MD  Treatment: 1. Type of chemotherapy: FEC 2. Date of last treatment: 06/07/13  Last Blood Counts: Lab Results  Component Value Date   WBC 1.9* 06/14/2013   HGB 9.6* 06/14/2013   HCT 29.4* 06/14/2013   MCV 92.2 06/14/2013   PLT 186 06/14/2013   ANC 900     Prophylactic Antibiotics: Cipro 500 mg by mouth twice a day Instructions: 1. Monitor temperature and call if fever  greater than 100.5, chills, shaking chills (rigors) 2. Call Physician on-call at [phone] 3. Give him/her symptoms and list of medications that you are taking and your last blood count.

## 2013-06-14 NOTE — Telephone Encounter (Signed)
Per staff message and POF I have scheduled appts.  JMW  

## 2013-06-14 NOTE — Telephone Encounter (Signed)
appts made and printed. Pt is aware that tx's will be added and her tx for 12/5 time will be adjusted. i emailed MW to ad the tx's. Pt is also aware that i emailed [facility] and [facility] asking for time slots for 12/26 and 1/2. Pt is aware that once they respond i will call her will those appt d/t...td

## 2013-06-14 NOTE — Progress Notes (Signed)
[facility]  [phone] [phone]  OFFICE PROGRESS NOTE    PATIENT: Kathy Maxwell   DOB: 01/22/1956  MR#: [id number]  JYN#:[id number]   QM:[id number] N, MD SU:  Claud Kelp, MD   DIAGNOSIS: Kathy Maxwell is a 57 y.o. 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  Treated with adjuvant chemotherapy.   PRIOR THERAPY: 1.  Screening mammogram in 12/2012 was abnormal which led to left digital diagnostic mammogram and left breast ultrasound on 03/01/2013. Ultrasound of the left breast showed a hypoechoic mass at 12:30, 6 cm from the nipple, that measured 1.9 x 2.6 x 2.9 cm.  Ultrasound of the left axilla demonstrates a 1.1 x 0.7 x 0.9 cm lymph node with a borderline thickened cortex.  2.  Left breast needle core biopsy at the 12:30 o'clock position and left needle core biopsy of the left axillary lymph node on 03/07/2013 showed invasive mammary carcinoma with features compatible with a grade 1 or 2 ductal carcinoma in the breast needle core biopsy and lymph node was positive for metastatic mammary carcinoma with a macrometastasis and extracapsular extension identified.  Estrogen receptor 100% positive, progesterone receptor 75% positive, Ki-67 15%, HER-2/neu by CISH no amplification.  3.  Bilateral breast MRI on 03/15/2013 showed in the left breast there were at least 4 focus of abnormal spiculated enhancement in the upper left breast centrally.  The entire area measured 7.4 cm x 6.6 cm x 5.1 cm.  The previous biopsy focus was posteriorly located and measured 2.1 x 1 x 2.1 cm.  The anterior focus that was not biopsied measured 3.1 x 2.6 x 2.6 cm.  A lateral focus that was not biopsied measured 1.1 x 1.4 x 1.6 cm.  There were  small medial focus measuring cyst 0.7 x 0.8 x 0.7 cm  Post biopsy changes were identified in the left axilla.  There were abnormal enlarged lymph nodes correlating to the patient's known metastatic neoplasm to the lymph nodes which the largest lymph node measured 1.8 x 0.8 cm.  In the right breast there was an irregular focus of 3.3 x 3.1 x 1.3 cm abnormal enhancement in the central right breast in the middle one third.  There was a mild thickened cortex and prominent lymph nodes within the right axilla with the largest measuring 1.6 x 1 cm.  There was a posterior central upper breast focus with associated clip artifact measuring 1.2 x 2.2 x 1 cm previously biopsied to be benign.  Suspicious findings in bilateral breasts.  4.  Anterior left breast needle core biopsy and central right breast needle core biopsy on 04/03/2013.  Left breast needle core biopsy showed invasive ductal carcinoma and ductal carcinoma in situ, 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% positive, progesterone receptor 23% positive, Ki-67 33%, and HER-2/neu by CISH no amplification.  Right breast needle core biopsy showed ductal carcinoma in situ, 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% positive, progesterone receptor 11% positive.  5.  Status post left breast modified radical mastectomy with left axillary lymph node resection and right breast lumpectomy with right axillary node biopsy on 04/22/2013 with pathology results as follows:  Left breast: Stage IIIA, pT3, pN2a, invasive ductal carcinoma with multiple foci, grade 1, largest span was 5.5 cm and ductal carcinoma in situ low-grade, 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% positive,  progesterone receptor 23% positive, HER-2/neu no amplification, Ki-67 33%, with 4/22 metastatic lymph nodes and 1 isolated tumor cells identified within left axillary lymph node resection.  Extracapsular extension was present and surgical resection margins were negative for carcinoma.  Right breast: Stage 0, pTis, pN0, 3.0 cm ductal carcinoma in situ with  calcifications, intermediate to high-grade, 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% positive, progesterone receptor 11% positive, with 0/4 metastatic right axillary lymph nodes.  Reexcision of right breast medial margin: Ductal carcinoma in situ, intermediate grade spanning 0.8 cm, ductal carcinoma in situ is focally less than 0.1 cm from the new medial margin, lobular neoplasia (atypical lobular hyperplasia).  6.  Referred for genetics counseling and testing - appointment scheduled for 06/20/2013.  7.  Adjuvant chemotherapy consisting of FEC (5-FU/epirubicin/Cytoxan)  that will be followed by adjuvant chemotherapy consisting of Taxol.  2-D echocardiogram on 04/18/2013 showed a left ventricular ejection fraction of 55% - 60%.   Started adjuvant chemotherapy with FEC on 05/24/2013.   CURRENT THERAPY:  FEC cycle 2 day 8  INTERVAL HISTORY: Kathy Maxwell is here for evaluation following her second cycle of FEC chemotherapy.  She is slightly fatigued and is reporting hyperpigmentation on her hands and numbness in her fingertips.  She is not having any motor difficulties or numbness in her feet.  Otherwise, she denies fevers, chills, nausea, vomiting, diarrhea, constipation, or any further concerns.    PAST MEDICAL HISTORY: Past Medical History  Diagnosis Date  . Hyperlipidemia   . Hypertension   . Bipolar 1 disorder   . Cancer   . Hypertension 04/04/2013  . Hyperlipidemia 04/04/2013  . Bipolar 1 disorder 04/04/2013  . Breast cancer 03/10/13    left breast invasive ca  . Allergy     latex  . Obesity   . Diabetes mellitus without complication     NIDDM  . Sleep apnea     no cpap due to insurance  . Heart murmur   . GERD (gastroesophageal reflux disease)   . Coronary artery disease     PAST SURGICAL HISTORY: Past Surgical History  Procedure Laterality Date  . Cholecystectomy    . Breast biopsy Left 05/08/09    fibroadenoma with microcalcifications,no malignancy id  . Breast biopsy Left 03/07/13     invasive ca,1 lymph node metastatic  . 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 Abdominal hysterectomy    . Coronary stent placement  2001  . Cardiac catheterization    . Eye surgery  6/13,9/14    laser,cataract lft  . Coronary angioplasty      LAD stent 2003  . Portacath placement Right 04/22/2013    Procedure: INSERTION PORT-A-CATH;  Surgeon: [name], MD;  Location: [facility] OR;  Service: General;  Laterality: Right;  . Mastectomy modified radical Left 04/22/2013    Procedure: MASTECTOMY MODIFIED RADICAL;  Surgeon: [name], MD;  Location: [facility] OR;  Service: General;  Laterality: Left;  . Breast lumpectomy with needle localization and axillary sentinel lymph node bx Right 04/22/2013    Procedure: BREAST LUMPECTOMY WITH NEEDLE LOCALIZATION AND AXILLARY SENTINEL LYMPH NODE BX;  Surgeon: [name], MD;  Location: [facility] OR;  Service: General;  Laterality: Right;    FAMILY HISTORY: Family History  Problem Relation Age of Onset  . Heart disease Mother   . Cancer Maternal Aunt 69    breast cancer  . Cancer Cousin 21    breast cancer    SOCIAL HISTORY: History  Substance Use Topics  . Smoking status: Former Smoker -- 1.00 packs/day for 15 years    Types: Cigarettes    Quit date: 07/19/1987  . Smokeless tobacco: Never Used  . Alcohol Use: No    ALLERGIES: Allergies  Allergen Reactions  . Adhesive [Tape] Rash    Clear tape gloves ,elastic no problem     MEDICATIONS:  Current Outpatient Prescriptions  Medication Sig Dispense Refill  . amLODipine (NORVASC) 10 MG tablet Take 10 mg by mouth daily.      . Azilsartan-Chlorthalidone 40-25 MG TABS Take 1 tablet by mouth daily.       [name] buPROPion (WELLBUTRIN SR) 150 MG 12 hr tablet Take 150 mg by mouth daily.       . ciprofloxacin (CIPRO) 500 MG tablet Take 1 tablet (500 mg total) by mouth 2 (two) times daily.  14 tablet  2  . dexamethasone (DECADRON) 4 MG tablet Take 2  tablets by mouth once a day on the day after chemotherapy and then take 2 tablets two times a day for 2 days. Take with food.  30 tablet  1  . hydrALAZINE (APRESOLINE) 25 MG tablet Take 25 mg by mouth 2 (two) times daily.      [name] HYDROcodone-acetaminophen (NORCO/VICODIN) 5-325 MG per tablet Take 1-2 tablets by mouth every 4 (four) hours as needed for pain.  30 tablet  0  . Lancet Devices (EASY TOUCH LANCING DEVICE) MISC       . lidocaine-prilocaine (EMLA) cream Apply topically as needed.  30 g  0  . LITE TOUCH LANCETS MISC       . LORazepam (ATIVAN) 0.5 MG tablet Take 1 tablet (0.5mg ) every 6 hours as needed for nausea or vomiting.  30 tablet  0  . losartan (COZAAR) 100 MG tablet Take 100 mg by mouth daily.       . metoprolol succinate (TOPROL-XL) 25 MG 24 hr tablet Take 25 mg by mouth daily.      . ondansetron (ZOFRAN) 8 MG tablet Take 1 tablet (8mg s) every 12 hours as needed for nausea or vomiting starting the 3rd day after chemotherapy.  30 tablet  1  . ONE TOUCH ULTRA TEST test strip       . pantoprazole (PROTONIX) 40 MG tablet Take 40 mg by mouth daily.      . pioglitazone-metformin (ACTOPLUS MET) 15-850 MG per tablet Take 1 tablet by mouth 2 (two) times daily with a meal.      . prochlorperazine (COMPAZINE) 10 MG tablet Take 1 tablet (10 mg total) by mouth every 6 (six) hours as needed (Nausea or vomiting).  30 tablet  1  . rosuvastatin (CRESTOR) 5 MG tablet Take 5 mg by mouth daily.      . sitaGLIPtin (JANUVIA) 100 MG tablet Take 100 mg by mouth daily.       No current facility-administered medications for this visit.   Facility-Administered Medications Ordered in Other Visits  Medication Dose Route Frequency Provider Last Rate Last Dose  . sodium chloride 0.9 % injection 10 mL  10 mL Intravenous PRN Victorino December, MD   10 mL at 05/24/13 1522      REVIEW OF SYSTEMS: A 10 point review of systems was completed and is negative except as noted above.   PHYSICAL EXAMINATION: BP 158/73   Pulse 92  Temp(Src) 98.4 F (36.9 C) (Oral)  Resp 18  Ht 5\' 2"  (1.575 m)  Wt 206 lb 8 oz (93.668 kg)  BMI 37.76 kg/m2  General: Patient is a well appearing female in no acute distress HEENT: PERRLA, sclerae anicteric no conjunctival pallor, MMM Neck: supple, no palpable adenopathy Lungs: clear to auscultation bilaterally, no wheezes, rhonchi, or rales Cardiovascular: regular rate rhythm, S1, S2, no murmurs, rubs or gallops Abdomen: Soft, non-tender, non-distended, normoactive bowel sounds, no HSM Extremities: warm and well perfused, no clubbing, cyanosis, or edema Skin: No rashes or lesions Neuro: Non-focal Breasts:  ECOG FS:  0 - Asymptomatic     LAB RESULTS: Lab Results  Component Value Date   WBC 1.9* 06/14/2013   NEUTROABS 0.9* 06/14/2013   HGB 9.6* 06/14/2013   HCT 29.4* 06/14/2013   MCV 92.2 06/14/2013   PLT 186 06/14/2013      Chemistry      Component Value Date/Time   NA 137 06/14/2013 0907   NA 134* 04/24/2013 0550   K 3.9 06/14/2013 0907   K 3.8 04/24/2013 0550   CL 101 04/24/2013 0550   CO2 27 06/14/2013 0907   CO2 25 04/24/2013 0550   BUN 20.7 06/14/2013 0907   BUN 19 04/24/2013 0550   CREATININE 0.9 06/14/2013 0907   CREATININE 0.98 04/24/2013 0550      Component Value Date/Time   CALCIUM 9.5 06/14/2013 0907   CALCIUM 8.3* 04/24/2013 0550   ALKPHOS 128 06/14/2013 0907   AST 8 06/14/2013 0907   ALT 7 06/14/2013 0907   BILITOT 0.23 06/14/2013 0907       No results found for this basename: [id number]    No components found with this basename: [id number]     RADIOGRAPHIC STUDIES: 1.    2-D echocardiogram on 04/18/2013 showed a left ventricular ejection fraction of 55% - 60%    ASSESSMENT:  JESSEE NEWNAM is a  57 y.o. woman:  1.  Status post left breast modified radical mastectomy with left axillary lymph node resection and right breast lumpectomy with right axillary node biopsy on 04/22/2013 with pathology results as follows:  Left breast:  Stage IIIA, pT3, pN2a, invasive ductal carcinoma with multiple foci, grade 1, largest span was 5.5 cm and ductal carcinoma in situ low-grade, 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% positive, progesterone receptor 23% positive, HER-2/neu no amplification, Ki-67 33%, with 4/22 metastatic lymph nodes and 1 isolated tumor cells identified within left axillary lymph node resection.  Extracapsular extension was present and surgical resection margins were negative for carcinoma.  Right breast: Stage 0, pTis, pN0, 3.0 cm ductal carcinoma in situ with calcifications, intermediate to high-grade, 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% positive, progesterone receptor 11% positive, with 0/4 metastatic right axillary lymph nodes.  Reexcision of right breast medial margin: Ductal carcinoma in situ, intermediate grade spanning 0.8 cm, ductal carcinoma in situ is focally less than 0.1 cm from the new medial margin, lobular neoplasia (atypical lobular hyperplasia).  2..  Referred for genetics counseling/testing - appointment scheduled for 06/20/2013.  3.  Adjuvant chemotherapy consisting of FEC (5-FU/Epirubicin/Cytoxan)  that will be followed by adjuvant chemotherapy consisting of Taxol.  2-D echocardiogram on 04/18/2013 showed a left ventricular ejection fraction of 55% - 60%.   Started adjuvant chemotherapy with FEC on 05/24/2013.  4.  Chemotherapy induced neutropenia - resolved   PLAN: 1.  Ms. Friske is doing well today.  Her CBC is stable, she is moderately neutropenic with an ANC of 900.  She is taking her Cipro and she was given detailed neutropenic instructions in her AVS.  These were reviewed with her as well.    2.  I  recommended she take Super B complex daily for the numbness.    3.  She will return next week for labs, evaluation and chemotherapy.    All questions were answered.  Ms. Temples was encouraged to contact us in the interim with any problems, questions or concerns  I spent 25 minutes counseling the patient face to face.   The total time spent in the appointment was 30 minutes.  Illa Level, NP Medical Oncology [facility] [phone]  06/16/2013    8:43 AM

## 2013-06-19 ENCOUNTER — Ambulatory Visit: Payer: Medicare Other | Attending: General Surgery

## 2013-06-19 DIAGNOSIS — M24519 Contracture, unspecified shoulder: Secondary | ICD-10-CM | POA: Insufficient documentation

## 2013-06-19 DIAGNOSIS — I890 Lymphedema, not elsewhere classified: Secondary | ICD-10-CM | POA: Insufficient documentation

## 2013-06-19 DIAGNOSIS — IMO0001 Reserved for inherently not codable concepts without codable children: Secondary | ICD-10-CM | POA: Insufficient documentation

## 2013-06-20 ENCOUNTER — Ambulatory Visit: Payer: Medicare Other

## 2013-06-20 ENCOUNTER — Ambulatory Visit (HOSPITAL_BASED_OUTPATIENT_CLINIC_OR_DEPARTMENT_OTHER): Payer: Medicare Other | Admitting: Genetic Counselor

## 2013-06-20 ENCOUNTER — Other Ambulatory Visit (HOSPITAL_BASED_OUTPATIENT_CLINIC_OR_DEPARTMENT_OTHER): Payer: Medicare Other | Admitting: Lab

## 2013-06-20 ENCOUNTER — Encounter: Payer: Self-pay | Admitting: Genetic Counselor

## 2013-06-20 DIAGNOSIS — C50412 Malignant neoplasm of upper-outer quadrant of left female breast: Secondary | ICD-10-CM

## 2013-06-20 DIAGNOSIS — C50919 Malignant neoplasm of unspecified site of unspecified female breast: Secondary | ICD-10-CM

## 2013-06-20 DIAGNOSIS — C773 Secondary and unspecified malignant neoplasm of axilla and upper limb lymph nodes: Secondary | ICD-10-CM

## 2013-06-20 DIAGNOSIS — Z801 Family history of malignant neoplasm of trachea, bronchus and lung: Secondary | ICD-10-CM

## 2013-06-20 DIAGNOSIS — Z808 Family history of malignant neoplasm of other organs or systems: Secondary | ICD-10-CM

## 2013-06-20 DIAGNOSIS — D0591 Unspecified type of carcinoma in situ of right breast: Secondary | ICD-10-CM

## 2013-06-20 DIAGNOSIS — C50419 Malignant neoplasm of upper-outer quadrant of unspecified female breast: Secondary | ICD-10-CM

## 2013-06-20 DIAGNOSIS — Z803 Family history of malignant neoplasm of breast: Secondary | ICD-10-CM

## 2013-06-20 DIAGNOSIS — IMO0002 Reserved for concepts with insufficient information to code with codable children: Secondary | ICD-10-CM

## 2013-06-20 LAB — COMPREHENSIVE METABOLIC PANEL (CC13)
AST: 12 U/L (ref 5–34)
Albumin: 3.7 g/dL (ref 3.5–5.0)
BUN: 11.9 mg/dL (ref 7.0–26.0)
CO2: 27 mEq/L (ref 22–29)
Calcium: 9.6 mg/dL (ref 8.4–10.4)
Chloride: 104 mEq/L (ref 98–109)
Creatinine: 1 mg/dL (ref 0.6–1.1)
Potassium: 3.8 mEq/L (ref 3.5–5.1)
Total Protein: 6.7 g/dL (ref 6.4–8.3)

## 2013-06-20 LAB — CBC WITH DIFFERENTIAL/PLATELET
BASO%: 0.6 % (ref 0.0–2.0)
Basophils Absolute: 0.1 10*3/uL (ref 0.0–0.1)
EOS%: 0.2 % (ref 0.0–7.0)
Eosinophils Absolute: 0 10*3/uL (ref 0.0–0.5)
HCT: 30.4 % — ABNORMAL LOW (ref 34.8–46.6)
HGB: 9.6 g/dL — ABNORMAL LOW (ref 11.6–15.9)
MCHC: 31.6 g/dL (ref 31.5–36.0)
MONO#: 1 10*3/uL — ABNORMAL HIGH (ref 0.1–0.9)
NEUT#: 13.7 10*3/uL — ABNORMAL HIGH (ref 1.5–6.5)
NEUT%: 83.7 % — ABNORMAL HIGH (ref 38.4–76.8)
Platelets: 207 10*3/uL (ref 145–400)
WBC: 16.4 10*3/uL — ABNORMAL HIGH (ref 3.9–10.3)
lymph#: 1.5 10*3/uL (ref 0.9–3.3)

## 2013-06-20 NOTE — Progress Notes (Signed)
Dr.  Drue Second requested a consultation for genetic counseling and risk assessment for Kathy Maxwell, a 57 y.o. female, for discussion of her personal history of bilateral breast cancer and family history of breast, lung, and brain cancer.  She presents to clinic today to discuss the possibility of a genetic predisposition to cancer, and to further clarify her risks, as well as her family members' risks for cancer.   HISTORY OF PRESENT ILLNESS: In 2014, at the age of 62, Kathy Maxwell was diagnosed with bilateral breast cancer of the breast. This was treated with lumpectomy on the right and mastectomy on the left side.  She is curerntly undergoing chemotherapy and will receive radiation.  No other family members have pursued genetic testing that she is aware of.    Past Medical History  Diagnosis Date  . Hyperlipidemia   . Hypertension   . Bipolar 1 disorder   . Cancer   . Hypertension 04/04/2013  . Hyperlipidemia 04/04/2013  . Bipolar 1 disorder 04/04/2013  . Breast cancer 03/10/13    left breast invasive ca  . Allergy     latex  . Obesity   . Diabetes mellitus without complication     NIDDM  . Sleep apnea     no cpap due to insurance  . Heart murmur   . GERD (gastroesophageal reflux disease)   . Coronary artery disease     Past Surgical History  Procedure Laterality Date  . Cholecystectomy    . Breast biopsy Left 05/08/09    fibroadenoma with microcalcifications,no malignancy id  . Breast biopsy Left 03/07/13    invasive ca,1 lymph node metastatic  . 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 Abdominal hysterectomy    . Coronary stent placement  2001  . Cardiac catheterization    . Eye surgery  6/13,9/14    laser,cataract lft  . Coronary angioplasty      LAD stent 2003  . Portacath placement Right 04/22/2013    Procedure: INSERTION PORT-A-CATH;  Surgeon: [name], MD;  Location: [facility] OR;  Service: General;   Laterality: Right;  . Mastectomy modified radical Left 04/22/2013    Procedure: MASTECTOMY MODIFIED RADICAL;  Surgeon: [name], MD;  Location: [facility] OR;  Service: General;  Laterality: Left;  . Breast lumpectomy with needle localization and axillary sentinel lymph node bx Right 04/22/2013    Procedure: BREAST LUMPECTOMY WITH NEEDLE LOCALIZATION AND AXILLARY SENTINEL LYMPH NODE BX;  Surgeon: [name], MD;  Location: [facility] OR;  Service: General;  Laterality: Right;    History   Social History  . Marital Status: Single    Spouse Name: N/A    Number of Children: 0  . Years of Education: N/A   Occupational History  . DISABLED    Social History Main Topics  . Smoking status: Former Smoker -- 1.00 packs/day for 15 years    Types: Cigarettes    Quit date: 07/19/1987  . Smokeless tobacco: Never Used  . Alcohol Use: No  . Drug Use: No  . Sexual Activity: No   Other Topics Concern  . None   Social History Narrative  . None    REPRODUCTIVE HISTORY AND PERSONAL RISK ASSESSMENT FACTORS: Menarche was at age 73.   postmenopausal Uterus Intact: no, removed in 1997 because of fibroids Ovaries Intact: no, removed in 1997 [id number], first live birth at age N/A  She has not previously undergone treatment for infertility.  Oral Contraceptive use: 0 years   She has not used HRT in the past.    FAMILY HISTORY:  We obtained a detailed, 4-generation family history.  Significant diagnoses are listed below: Family History  Problem Relation Age of Onset  . Heart disease Mother   . Breast cancer Maternal Aunt     dx in her 57s  . Heart disease Cousin 35    maternal cousin  . Heart attack Sister 36  . Cancer Paternal Aunt     NOS  . Cancer Maternal Aunt     NOS  . Lung cancer Cousin 22    maternal cousin - non smoker  . Brain cancer Cousin 13    maternal cousin's son  . Lung cancer Cousin 62    maternal cousin's son  . Breast cancer Cousin     paternal cousin  The patient's  mother had four brothers and five sisters. Her father had multiple brothers and sisters, but the total number is unknown.  Patient's maternal ancestors are of [place] American and Caucasian descent, and paternal ancestors are of [place] American and Caucasian descent. There is no reported Ashkenazi Jewish ancestry. There is no known consanguinity.  GENETIC COUNSELING ASSESSMENT: Kathy Maxwell is a 57 y.o. female with a personal history of bilateral breast cancer and family history of breast, lung and brain cancer which somewhat suggestive of a hereditary cancer syndrome and predisposition to cancer. We, therefore, discussed and recommended the following at today's visit.   DISCUSSION: We reviewed the characteristics, features and inheritance patterns of hereditary cancer syndromes. We also discussed genetic testing, including the appropriate family members to test, the process of testing, insurance coverage and turn-around-time for results.   PLAN: After considering the risks, benefits, and limitations, Kathy Maxwell provided informed consent to pursue genetic testing and the blood sample will be sent to [facility] for analysis of the Breast/Ovarian Cancer Panel. We discussed the implications of a positive, negative and/ or variant of uncertain significance genetic test result. Results should be available within approximately 3 weeks' time, at which point they will be disclosed by telephone to Kathy Maxwell, as will any additional recommendations warranted by these results. Kathy Maxwell will receive a summary of her genetic counseling visit and a copy of her results once available. This information will also be available in Epic. We encouraged Kathy Maxwell to remain in contact with cancer genetics annually so that we can continuously update the family history and inform her of any changes in cancer genetics and testing that may be of benefit for her family. Kathy Maxwell questions were  answered to her satisfaction today. Our contact information was provided should additional questions or concerns arise.  The patient was seen for a total of 60 minutes, greater than 50% of which was spent face-to-face counseling.  This note will also be sent to the referring provider via the electronic medical record. The patient will be supplied with a summary of this genetic counseling discussion as well as educational information on the discussed hereditary cancer syndromes following the conclusion of their visit.   Patient was discussed with Dr. Drue Second.   _______________________________________________________________________ For Office Staff:  Number of people involved in session: 1 Was an Intern/ student involved with case: no

## 2013-06-21 ENCOUNTER — Encounter: Payer: Self-pay | Admitting: Adult Health

## 2013-06-21 ENCOUNTER — Ambulatory Visit (HOSPITAL_BASED_OUTPATIENT_CLINIC_OR_DEPARTMENT_OTHER): Payer: Medicare Other

## 2013-06-21 ENCOUNTER — Ambulatory Visit (HOSPITAL_BASED_OUTPATIENT_CLINIC_OR_DEPARTMENT_OTHER): Payer: Medicare Other | Admitting: Adult Health

## 2013-06-21 ENCOUNTER — Other Ambulatory Visit: Payer: Medicare Other | Admitting: Lab

## 2013-06-21 VITALS — BP 151/77 | HR 92 | Temp 98.9°F | Resp 18 | Ht 62.0 in | Wt 209.2 lb

## 2013-06-21 DIAGNOSIS — Z170 Estrogen receptor positive status [ER+]: Secondary | ICD-10-CM

## 2013-06-21 DIAGNOSIS — C50119 Malignant neoplasm of central portion of unspecified female breast: Secondary | ICD-10-CM

## 2013-06-21 DIAGNOSIS — C773 Secondary and unspecified malignant neoplasm of axilla and upper limb lymph nodes: Secondary | ICD-10-CM

## 2013-06-21 DIAGNOSIS — C50919 Malignant neoplasm of unspecified site of unspecified female breast: Secondary | ICD-10-CM

## 2013-06-21 DIAGNOSIS — C50412 Malignant neoplasm of upper-outer quadrant of left female breast: Secondary | ICD-10-CM

## 2013-06-21 DIAGNOSIS — Z5111 Encounter for antineoplastic chemotherapy: Secondary | ICD-10-CM

## 2013-06-21 MED ORDER — PALONOSETRON HCL INJECTION 0.25 MG/5ML
INTRAVENOUS | Status: AC
  Filled 2013-06-21: qty 5

## 2013-06-21 MED ORDER — DEXAMETHASONE SODIUM PHOSPHATE 20 MG/5ML IJ SOLN
INTRAMUSCULAR | Status: AC
  Filled 2013-06-21: qty 5

## 2013-06-21 MED ORDER — HEPARIN SOD (PORK) LOCK FLUSH 100 UNIT/ML IV SOLN
500.0000 [IU] | Freq: Once | INTRAVENOUS | Status: AC | PRN
  Administered 2013-06-21: 500 [IU]
  Filled 2013-06-21: qty 5

## 2013-06-21 MED ORDER — SODIUM CHLORIDE 0.9 % IV SOLN
Freq: Once | INTRAVENOUS | Status: AC
  Administered 2013-06-21: 12:00:00 via INTRAVENOUS

## 2013-06-21 MED ORDER — DEXAMETHASONE SODIUM PHOSPHATE 20 MG/5ML IJ SOLN
12.0000 mg | Freq: Once | INTRAMUSCULAR | Status: AC
  Administered 2013-06-21: 12 mg via INTRAVENOUS

## 2013-06-21 MED ORDER — SODIUM CHLORIDE 0.9 % IV SOLN
500.0000 mg/m2 | Freq: Once | INTRAVENOUS | Status: AC
  Administered 2013-06-21: 1000 mg via INTRAVENOUS
  Filled 2013-06-21: qty 50

## 2013-06-21 MED ORDER — PALONOSETRON HCL INJECTION 0.25 MG/5ML
0.2500 mg | Freq: Once | INTRAVENOUS | Status: AC
  Administered 2013-06-21: 0.25 mg via INTRAVENOUS

## 2013-06-21 MED ORDER — EPIRUBICIN HCL CHEMO IV INJECTION 200 MG/100ML
100.0000 mg/m2 | Freq: Once | INTRAVENOUS | Status: AC
  Administered 2013-06-21: 200 mg via INTRAVENOUS
  Filled 2013-06-21: qty 100

## 2013-06-21 MED ORDER — SODIUM CHLORIDE 0.9 % IJ SOLN
10.0000 mL | INTRAMUSCULAR | Status: DC | PRN
  Administered 2013-06-21: 10 mL
  Filled 2013-06-21: qty 10

## 2013-06-21 MED ORDER — SODIUM CHLORIDE 0.9 % IV SOLN
150.0000 mg | Freq: Once | INTRAVENOUS | Status: AC
  Administered 2013-06-21: 150 mg via INTRAVENOUS
  Filled 2013-06-21: qty 5

## 2013-06-21 MED ORDER — FLUOROURACIL CHEMO INJECTION 2.5 GM/50ML
500.0000 mg/m2 | Freq: Once | INTRAVENOUS | Status: AC
  Administered 2013-06-21: 1000 mg via INTRAVENOUS
  Filled 2013-06-21: qty 20

## 2013-06-21 NOTE — Progress Notes (Signed)
[facility]  [phone] [phone]  OFFICE PROGRESS NOTE    PATIENT: Kathy Maxwell   DOB: 29-Sep-1955  MR#: [id number]  JYN#:[id number]   QM:[id number] N, MD SU:  Claud Kelp, MD   DIAGNOSIS: Kathy Maxwell is a 57 y.o. 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  Treated with adjuvant chemotherapy.   PRIOR THERAPY: 1.  Screening mammogram in 12/2012 was abnormal which led to left digital diagnostic mammogram and left breast ultrasound on 03/01/2013. Ultrasound of the left breast showed a hypoechoic mass at 12:30, 6 cm from the nipple, that measured 1.9 x 2.6 x 2.9 cm.  Ultrasound of the left axilla demonstrates a 1.1 x 0.7 x 0.9 cm lymph node with a borderline thickened cortex.  2.  Left breast needle core biopsy at the 12:30 o'clock position and left needle core biopsy of the left axillary lymph node on 03/07/2013 showed invasive mammary carcinoma with features compatible with a grade 1 or 2 ductal carcinoma in the breast needle core biopsy and lymph node was positive for metastatic mammary carcinoma with a macrometastasis and extracapsular extension identified.  Estrogen receptor 100% positive, progesterone receptor 75% positive, Ki-67 15%, HER-2/neu by CISH no amplification.  3.  Bilateral breast MRI on 03/15/2013 showed in the left breast there were at least 4 focus of abnormal spiculated enhancement in the upper left breast centrally.  The entire area measured 7.4 cm x 6.6 cm x 5.1 cm.  The previous biopsy focus was posteriorly located and measured 2.1 x 1 x 2.1 cm.  The anterior focus that was not biopsied measured 3.1 x 2.6 x 2.6 cm.  A lateral focus that was not biopsied measured 1.1 x 1.4 x 1.6 cm.  There were  small medial focus measuring cyst 0.7 x 0.8 x 0.7 cm  Post biopsy changes were identified in the left axilla.  There were abnormal enlarged lymph nodes correlating to the patient's known metastatic neoplasm to the lymph nodes which the largest lymph node measured 1.8 x 0.8 cm.  In the right breast there was an irregular focus of 3.3 x 3.1 x 1.3 cm abnormal enhancement in the central right breast in the middle one third.  There was a mild thickened cortex and prominent lymph nodes within the right axilla with the largest measuring 1.6 x 1 cm.  There was a posterior central upper breast focus with associated clip artifact measuring 1.2 x 2.2 x 1 cm previously biopsied to be benign.  Suspicious findings in bilateral breasts.  4.  Anterior left breast needle core biopsy and central right breast needle core biopsy on 04/03/2013.  Left breast needle core biopsy showed invasive ductal carcinoma and ductal carcinoma in situ, 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% positive, progesterone receptor 23% positive, Ki-67 33%, and HER-2/neu by CISH no amplification.  Right breast needle core biopsy showed ductal carcinoma in situ, 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% positive, progesterone receptor 11% positive.  5.  Status post left breast modified radical mastectomy with left axillary lymph node resection and right breast lumpectomy with right axillary node biopsy on 04/22/2013 with pathology results as follows:  Left breast: Stage IIIA, pT3, pN2a, invasive ductal carcinoma with multiple foci, grade 1, largest span was 5.5 cm and ductal carcinoma in situ low-grade, 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% positive,  progesterone receptor 23% positive, HER-2/neu no amplification, Ki-67 33%, with 4/22 metastatic lymph nodes and 1 isolated tumor cells identified within left axillary lymph node resection.  Extracapsular extension was present and surgical resection margins were negative for carcinoma.  Right breast: Stage 0, pTis, pN0, 3.0 cm ductal carcinoma in situ with  calcifications, intermediate to high-grade, 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% positive, progesterone receptor 11% positive, with 0/4 metastatic right axillary lymph nodes.  Reexcision of right breast medial margin: Ductal carcinoma in situ, intermediate grade spanning 0.8 cm, ductal carcinoma in situ is focally less than 0.1 cm from the new medial margin, lobular neoplasia (atypical lobular hyperplasia).  6.  Referred for genetics counseling and testing - appointment scheduled for 06/20/2013.  7.  Adjuvant chemotherapy consisting of FEC (5-FU/epirubicin/Cytoxan)  that will be followed by adjuvant chemotherapy consisting of Taxol.  2-D echocardiogram on 04/18/2013 showed a left ventricular ejection fraction of 55% - 60%.   Started adjuvant chemotherapy with FEC on 05/24/2013.   CURRENT THERAPY:  FEC cycle 3 day 1  INTERVAL HISTORY: Kathy Maxwell is here for evaluation prior to her third cycle of FEC chemotherapy.  She is doing well today.  She denies fevers, chills, nausea, vomiting, constipation, diarrhea, numbness, or any further concerns.  A 10 point ROS is negative.    PAST MEDICAL HISTORY: Past Medical History  Diagnosis Date  . Hyperlipidemia   . Hypertension   . Bipolar 1 disorder   . Cancer   . Hypertension 04/04/2013  . Hyperlipidemia 04/04/2013  . Bipolar 1 disorder 04/04/2013  . Breast cancer 03/10/13    left breast invasive ca  . Allergy     latex  . Obesity   . Diabetes mellitus without complication     NIDDM  . Sleep apnea     no cpap due to insurance  . Heart murmur   . GERD (gastroesophageal reflux disease)   . Coronary artery disease     PAST SURGICAL HISTORY: Past Surgical History  Procedure Laterality Date  . Cholecystectomy    . Breast biopsy Left 05/08/09    fibroadenoma with microcalcifications,no malignancy id  . Breast biopsy Left 03/07/13    invasive ca,1 lymph node metastatic  . Breast biopsy Bilateral 03/07/13    left-invasive ductal ca,DCIS,  Right=DCIS,ER/PR=+her2=  . Abdominal hysterectomy    . Coronary stent placement  2001  . Cardiac catheterization    . Eye surgery  6/13,9/14    laser,cataract lft  . Coronary angioplasty      LAD stent 2003  . Portacath placement Right 04/22/2013    Procedure: INSERTION PORT-A-CATH;  Surgeon: [name], MD;  Location: [facility] OR;  Service: General;  Laterality: Right;  . Mastectomy modified radical Left 04/22/2013    Procedure: MASTECTOMY MODIFIED RADICAL;  Surgeon: [name], MD;  Location: [facility] OR;  Service: General;  Laterality: Left;  . Breast lumpectomy with needle localization and axillary sentinel lymph node bx Right 04/22/2013    Procedure: BREAST LUMPECTOMY WITH NEEDLE LOCALIZATION AND AXILLARY SENTINEL LYMPH NODE BX;  Surgeon: [name], MD;  Location: [facility] OR;  Service: General;  Laterality: Right;    FAMILY HISTORY: Family History  Problem Relation Age of Onset  . Heart disease Mother   . Breast cancer Maternal Aunt     dx in her 23s  . Heart disease Cousin 53    maternal cousin  . Heart attack Sister 16  . Cancer Paternal Aunt     NOS  .  Cancer Maternal Aunt     NOS  . Lung cancer Cousin 54    maternal cousin - non smoker  . Brain cancer Cousin 13    maternal cousin's son  . Lung cancer Cousin 77    maternal cousin's son  . Breast cancer Cousin     paternal cousin    SOCIAL HISTORY: History  Substance Use Topics  . Smoking status: Former Smoker -- 1.00 packs/day for 15 years    Types: Cigarettes    Quit date: 07/19/1987  . Smokeless tobacco: Never Used  . Alcohol Use: No    ALLERGIES: Allergies  Allergen Reactions  . Adhesive [Tape] Rash    Clear tape gloves ,elastic no problem     MEDICATIONS:  Current Outpatient Prescriptions  Medication Sig Dispense Refill  . amLODipine (NORVASC) 10 MG tablet Take 10 mg by mouth daily.      . Azilsartan-Chlorthalidone 40-25 MG TABS Take 1 tablet by mouth daily.       [name] buPROPion (WELLBUTRIN SR)  150 MG 12 hr tablet Take 150 mg by mouth daily.       . ciprofloxacin (CIPRO) 500 MG tablet Take 1 tablet (500 mg total) by mouth 2 (two) times daily.  14 tablet  2  . dexamethasone (DECADRON) 4 MG tablet Take 2 tablets by mouth once a day on the day after chemotherapy and then take 2 tablets two times a day for 2 days. Take with food.  30 tablet  1  . hydrALAZINE (APRESOLINE) 25 MG tablet Take 25 mg by mouth 2 (two) times daily.      [name] HYDROcodone-acetaminophen (NORCO/VICODIN) 5-325 MG per tablet Take 1-2 tablets by mouth every 4 (four) hours as needed for pain.  30 tablet  0  . Lancet Devices (EASY TOUCH LANCING DEVICE) MISC       . lidocaine-prilocaine (EMLA) cream Apply topically as needed.  30 g  0  . LITE TOUCH LANCETS MISC       . LORazepam (ATIVAN) 0.5 MG tablet Take 1 tablet (0.5mg ) every 6 hours as needed for nausea or vomiting.  30 tablet  0  . losartan (COZAAR) 100 MG tablet Take 100 mg by mouth daily.       . metoprolol succinate (TOPROL-XL) 25 MG 24 hr tablet Take 25 mg by mouth daily.      . ondansetron (ZOFRAN) 8 MG tablet Take 1 tablet (8mg s) every 12 hours as needed for nausea or vomiting starting the 3rd day after chemotherapy.  30 tablet  1  . ONE TOUCH ULTRA TEST test strip       . pantoprazole (PROTONIX) 40 MG tablet Take 40 mg by mouth daily.      . pioglitazone-metformin (ACTOPLUS MET) 15-850 MG per tablet Take 1 tablet by mouth 2 (two) times daily with a meal.      . prochlorperazine (COMPAZINE) 10 MG tablet Take 1 tablet (10 mg total) by mouth every 6 (six) hours as needed (Nausea or vomiting).  30 tablet  1  . rosuvastatin (CRESTOR) 5 MG tablet Take 5 mg by mouth daily.      . sitaGLIPtin (JANUVIA) 100 MG tablet Take 100 mg by mouth daily.       No current facility-administered medications for this visit.   Facility-Administered Medications Ordered in Other Visits  Medication Dose Route Frequency Provider Last Rate Last Dose  . sodium chloride 0.9 % injection 10 mL   10 mL Intravenous PRN Victorino December, MD  10 mL at 05/24/13 1522      REVIEW OF SYSTEMS: A 10 point review of systems was completed and is negative except as noted above.   PHYSICAL EXAMINATION: BP 151/77  Pulse 92  Temp(Src) 98.9 F (37.2 C) (Oral)  Resp 18  Ht 5\' 2"  (1.575 m)  Wt 209 lb 3.2 oz (94.892 kg)  BMI 38.25 kg/m2  General: Patient is a well appearing female in no acute distress HEENT: PERRLA, sclerae anicteric no conjunctival pallor, MMM Neck: supple, no palpable adenopathy Lungs: clear to auscultation bilaterally, no wheezes, rhonchi, or rales Cardiovascular: regular rate rhythm, S1, S2, no murmurs, rubs or gallops Abdomen: Soft, non-tender, non-distended, normoactive bowel sounds, no HSM Extremities: warm and well perfused, no clubbing, cyanosis, or edema Skin: No rashes or lesions Neuro: Non-focal Breasts:  ECOG FS:  0 - Asymptomatic   LAB RESULTS: Lab Results  Component Value Date   WBC 16.4* 06/20/2013   NEUTROABS 13.7* 06/20/2013   HGB 9.6* 06/20/2013   HCT 30.4* 06/20/2013   MCV 94.7 06/20/2013   PLT 207 06/20/2013      Chemistry      Component Value Date/Time   NA 140 06/20/2013 1142   NA 134* 04/24/2013 0550   K 3.8 06/20/2013 1142   K 3.8 04/24/2013 0550   CL 101 04/24/2013 0550   CO2 27 06/20/2013 1142   CO2 25 04/24/2013 0550   BUN 11.9 06/20/2013 1142   BUN 19 04/24/2013 0550   CREATININE 1.0 06/20/2013 1142   CREATININE 0.98 04/24/2013 0550      Component Value Date/Time   CALCIUM 9.6 06/20/2013 1142   CALCIUM 8.3* 04/24/2013 0550   ALKPHOS 128 06/20/2013 1142   AST 12 06/20/2013 1142   ALT 8 06/20/2013 1142   BILITOT 0.24 06/20/2013 1142       No results found for this basename: [id number]    No components found with this basename: [id number]     RADIOGRAPHIC STUDIES: 1.    2-D echocardiogram on 04/18/2013 showed a left ventricular ejection fraction of 55% - 60%    ASSESSMENT:  Kathy Maxwell is a  57 y.o. woman:  1.  Status post left  breast modified radical mastectomy with left axillary lymph node resection and right breast lumpectomy with right axillary node biopsy on 04/22/2013 with pathology results as follows:  Left breast: Stage IIIA, pT3, pN2a, invasive ductal carcinoma with multiple foci, grade 1, largest span was 5.5 cm and ductal carcinoma in situ low-grade, 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% positive, progesterone receptor 23% positive, HER-2/neu no amplification, Ki-67 33%, with 4/22 metastatic lymph nodes and 1 isolated tumor cells identified within left axillary lymph node resection.  Extracapsular extension was present and surgical resection margins were negative for carcinoma.  Right breast: Stage 0, pTis, pN0, 3.0 cm ductal carcinoma in situ with calcifications, intermediate to high-grade, 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% positive, progesterone receptor 11% positive, with 0/4 metastatic right axillary lymph nodes.  Reexcision of right breast medial margin: Ductal carcinoma in situ, intermediate grade spanning 0.8 cm, ductal carcinoma in situ is focally less than 0.1 cm from the new medial margin, lobular neoplasia (atypical lobular hyperplasia).  2..  Referred for genetics counseling/testing - appointment scheduled for 06/20/2013.  3.  Adjuvant chemotherapy consisting of FEC (5-FU/Epirubicin/Cytoxan)  that will be followed by adjuvant chemotherapy consisting of Taxol.  2-D echocardiogram on 04/18/2013 showed a left ventricular ejection fraction of 55% - 60%.   Started adjuvant chemotherapy with FEC on 05/24/2013.  4.  Chemotherapy induced neutropenia - resolved   PLAN: 1.  Patient is doing well today.  She will proceed with chemotherapy.    2.  She will return next week for labs and evaluation of chemotoxicities.    All questions were answered.  Ms. Spradlin was encouraged to contact us in the interim with any problems, questions or concerns  I spent 15 minutes counseling the patient face to face.  The total time spent in the  appointment was 30 minutes.  Illa Level, NP Medical Oncology [facility] [phone] 06/23/2013    7:47 AM

## 2013-06-21 NOTE — Patient Instructions (Signed)
[facility] Discharge Instructions for Patients Receiving Chemotherapy  Today you received the following chemotherapy agents Ellence, Adrucil and Cytoxan.  To help prevent nausea and vomiting after your treatment, we encourage you to take your nausea medication.   If you develop nausea and vomiting that is not controlled by your nausea medication, call the clinic.   BELOW ARE SYMPTOMS THAT SHOULD BE REPORTED IMMEDIATELY:  *FEVER GREATER THAN 100.5 F  *CHILLS WITH OR WITHOUT FEVER  NAUSEA AND VOMITING THAT IS NOT CONTROLLED WITH YOUR NAUSEA MEDICATION  *UNUSUAL SHORTNESS OF BREATH  *UNUSUAL BRUISING OR BLEEDING  TENDERNESS IN MOUTH AND THROAT WITH OR WITHOUT PRESENCE OF ULCERS  *URINARY PROBLEMS  *BOWEL PROBLEMS  UNUSUAL RASH Items with * indicate a potential emergency and should be followed up as soon as possible.  Feel free to call the clinic you have any questions or concerns. The clinic phone number is [phone].    

## 2013-06-22 ENCOUNTER — Ambulatory Visit (HOSPITAL_BASED_OUTPATIENT_CLINIC_OR_DEPARTMENT_OTHER): Payer: Medicare Other

## 2013-06-22 VITALS — BP 161/59 | HR 84 | Temp 98.9°F | Resp 18

## 2013-06-22 DIAGNOSIS — C50412 Malignant neoplasm of upper-outer quadrant of left female breast: Secondary | ICD-10-CM

## 2013-06-22 DIAGNOSIS — D0590 Unspecified type of carcinoma in situ of unspecified breast: Secondary | ICD-10-CM

## 2013-06-22 DIAGNOSIS — C50919 Malignant neoplasm of unspecified site of unspecified female breast: Secondary | ICD-10-CM

## 2013-06-22 DIAGNOSIS — C773 Secondary and unspecified malignant neoplasm of axilla and upper limb lymph nodes: Secondary | ICD-10-CM

## 2013-06-22 MED ORDER — PEGFILGRASTIM INJECTION 6 MG/0.6ML
6.0000 mg | Freq: Once | SUBCUTANEOUS | Status: AC
  Administered 2013-06-22: 6 mg via SUBCUTANEOUS

## 2013-06-26 ENCOUNTER — Ambulatory Visit: Payer: Medicare Other | Admitting: Physical Therapy

## 2013-06-26 ENCOUNTER — Telehealth: Payer: Self-pay | Admitting: *Deleted

## 2013-06-26 NOTE — Telephone Encounter (Signed)
Lm gv appt for 07/12/13 w/labs@ 11:15am and ov@ 11:45am, and for 07/19/13 w/ labs@ 1:45p and ov@ 2:15pm. Pt will get avs on 06/28/13.i also emailed MW to adjust tx for 07/19/14....td

## 2013-06-26 NOTE — Telephone Encounter (Signed)
Per staff message from scheduler I have adjusted appt for 1/2

## 2013-06-28 ENCOUNTER — Other Ambulatory Visit: Payer: Self-pay | Admitting: *Deleted

## 2013-06-28 ENCOUNTER — Ambulatory Visit (HOSPITAL_COMMUNITY)
Admission: RE | Admit: 2013-06-28 | Discharge: 2013-06-28 | Disposition: A | Discharge status: Home / Self Care | Payer: Medicare Other | Source: Ambulatory Visit | Attending: Oncology | Admitting: Oncology

## 2013-06-28 ENCOUNTER — Ambulatory Visit (HOSPITAL_BASED_OUTPATIENT_CLINIC_OR_DEPARTMENT_OTHER): Payer: Medicare Other | Admitting: Oncology

## 2013-06-28 ENCOUNTER — Other Ambulatory Visit (HOSPITAL_BASED_OUTPATIENT_CLINIC_OR_DEPARTMENT_OTHER): Payer: Medicare Other

## 2013-06-28 ENCOUNTER — Ambulatory Visit: Payer: Medicare Other

## 2013-06-28 ENCOUNTER — Ambulatory Visit: Payer: Medicare Other | Admitting: Physical Therapy

## 2013-06-28 ENCOUNTER — Encounter: Payer: Self-pay | Admitting: Oncology

## 2013-06-28 ENCOUNTER — Telehealth: Payer: Self-pay | Admitting: *Deleted

## 2013-06-28 VITALS — BP 182/70 | HR 102 | Temp 98.4°F | Resp 18 | Ht 62.0 in | Wt 214.6 lb

## 2013-06-28 DIAGNOSIS — D6481 Anemia due to antineoplastic chemotherapy: Secondary | ICD-10-CM

## 2013-06-28 DIAGNOSIS — D0590 Unspecified type of carcinoma in situ of unspecified breast: Secondary | ICD-10-CM

## 2013-06-28 DIAGNOSIS — C50412 Malignant neoplasm of upper-outer quadrant of left female breast: Secondary | ICD-10-CM

## 2013-06-28 DIAGNOSIS — C773 Secondary and unspecified malignant neoplasm of axilla and upper limb lymph nodes: Secondary | ICD-10-CM

## 2013-06-28 DIAGNOSIS — C50419 Malignant neoplasm of upper-outer quadrant of unspecified female breast: Secondary | ICD-10-CM

## 2013-06-28 DIAGNOSIS — Z9010 Acquired absence of unspecified breast and nipple: Secondary | ICD-10-CM

## 2013-06-28 DIAGNOSIS — C50919 Malignant neoplasm of unspecified site of unspecified female breast: Secondary | ICD-10-CM

## 2013-06-28 DIAGNOSIS — D630 Anemia in neoplastic disease: Secondary | ICD-10-CM | POA: Insufficient documentation

## 2013-06-28 LAB — CBC WITH DIFFERENTIAL/PLATELET
EOS%: 2.5 % (ref 0.0–7.0)
Eosinophils Absolute: 0.1 10*3/uL (ref 0.0–0.5)
HGB: 7.7 g/dL — ABNORMAL LOW (ref 11.6–15.9)
LYMPH%: 23.8 % (ref 14.0–49.7)
MCH: 30 pg (ref 25.1–34.0)
MCHC: 32.4 g/dL (ref 31.5–36.0)
MCV: 92.6 fL (ref 79.5–101.0)
MONO%: 7.6 % (ref 0.0–14.0)
NEUT#: 2.3 10*3/uL (ref 1.5–6.5)
NEUT%: 65.5 % (ref 38.4–76.8)
Platelets: 195 10*3/uL (ref 145–400)
RBC: 2.57 10*6/uL — ABNORMAL LOW (ref 3.70–5.45)
RDW: 14.8 % — ABNORMAL HIGH (ref 11.2–14.5)

## 2013-06-28 LAB — COMPREHENSIVE METABOLIC PANEL (CC13)
AST: 8 U/L (ref 5–34)
Alkaline Phosphatase: 120 U/L (ref 40–150)
Anion Gap: 10 mEq/L (ref 3–11)
BUN: 10.5 mg/dL (ref 7.0–26.0)
Creatinine: 0.8 mg/dL (ref 0.6–1.1)
Glucose: 184 mg/dl — ABNORMAL HIGH (ref 70–140)
Potassium: 3.5 mEq/L (ref 3.5–5.1)
Sodium: 140 mEq/L (ref 136–145)
Total Bilirubin: <0.2 mg/dL (ref 0.20–1.20)
Total Protein: 6.3 g/dL — ABNORMAL LOW (ref 6.4–8.3)

## 2013-06-28 NOTE — Telephone Encounter (Signed)
appts made and printed...td 

## 2013-06-29 ENCOUNTER — Ambulatory Visit (HOSPITAL_BASED_OUTPATIENT_CLINIC_OR_DEPARTMENT_OTHER): Payer: Medicare Other

## 2013-06-29 VITALS — BP 148/67 | HR 87 | Temp 97.9°F | Resp 18

## 2013-06-29 DIAGNOSIS — D630 Anemia in neoplastic disease: Secondary | ICD-10-CM

## 2013-06-29 MED ORDER — SODIUM CHLORIDE 0.9 % IJ SOLN
10.0000 mL | INTRAMUSCULAR | Status: AC | PRN
  Administered 2013-06-29: 10 mL
  Filled 2013-06-29: qty 10

## 2013-06-29 MED ORDER — SODIUM CHLORIDE 0.9 % IV SOLN
250.0000 mL | Freq: Once | INTRAVENOUS | Status: AC
  Administered 2013-06-29: 250 mL via INTRAVENOUS

## 2013-06-29 MED ORDER — DIPHENHYDRAMINE HCL 25 MG PO CAPS
ORAL_CAPSULE | ORAL | Status: AC
  Filled 2013-06-29: qty 1

## 2013-06-29 MED ORDER — ACETAMINOPHEN 325 MG PO TABS
ORAL_TABLET | ORAL | Status: AC
  Filled 2013-06-29: qty 2

## 2013-06-29 MED ORDER — ACETAMINOPHEN 325 MG PO TABS
650.0000 mg | ORAL_TABLET | Freq: Once | ORAL | Status: AC
  Administered 2013-06-29: 650 mg via ORAL

## 2013-06-29 MED ORDER — DIPHENHYDRAMINE HCL 25 MG PO CAPS
25.0000 mg | ORAL_CAPSULE | Freq: Once | ORAL | Status: AC
  Administered 2013-06-29: 25 mg via ORAL

## 2013-06-29 MED ORDER — HEPARIN SOD (PORK) LOCK FLUSH 100 UNIT/ML IV SOLN
500.0000 [IU] | Freq: Every day | INTRAVENOUS | Status: AC | PRN
  Administered 2013-06-29: 500 [IU]
  Filled 2013-06-29: qty 5

## 2013-06-29 NOTE — Patient Instructions (Signed)
Blood Transfusion Information WHAT IS A BLOOD TRANSFUSION? A transfusion is the replacement of blood or some of its parts. Blood is made up of multiple cells which provide different functions.  Red blood cells carry oxygen and are used for blood loss replacement.  White blood cells fight against infection.  Platelets control bleeding.  Plasma helps clot blood.  Other blood products are available for specialized needs, such as hemophilia or other clotting disorders. BEFORE THE TRANSFUSION  Who gives blood for transfusions?   You may be able to donate blood to be used at a later date on yourself (autologous donation).  Relatives can be asked to donate blood. This is generally not any safer than if you have received blood from a stranger. The same precautions are taken to ensure safety when a relative's blood is donated.  Healthy volunteers who are fully evaluated to make sure their blood is safe. This is blood bank blood. Transfusion therapy is the safest it has ever been in the practice of medicine. Before blood is taken from a donor, a complete history is taken to make sure that person has no history of diseases nor engages in risky social behavior (examples are intravenous drug use or sexual activity with multiple partners). The donor's travel history is screened to minimize risk of transmitting infections, such as malaria. The donated blood is tested for signs of infectious diseases, such as HIV and hepatitis. The blood is then tested to be sure it is compatible with you in order to minimize the chance of a transfusion reaction. If you or a relative donates blood, this is often done in anticipation of surgery and is not appropriate for emergency situations. It takes many days to process the donated blood. RISKS AND COMPLICATIONS Although transfusion therapy is very safe and saves many lives, the main dangers of transfusion include:   Getting an infectious disease.  Developing a  transfusion reaction. This is an allergic reaction to something in the blood you were given. Every precaution is taken to prevent this. The decision to have a blood transfusion has been considered carefully by your caregiver before blood is given. Blood is not given unless the benefits outweigh the risks. AFTER THE TRANSFUSION  Right after receiving a blood transfusion, you will usually feel much better and more energetic. This is especially true if your red blood cells have gotten low (anemic). The transfusion raises the level of the red blood cells which carry oxygen, and this usually causes an energy increase.  The nurse administering the transfusion will monitor you carefully for complications. HOME CARE INSTRUCTIONS  No special instructions are needed after a transfusion. You may find your energy is better. Speak with your caregiver about any limitations on activity for underlying diseases you may have. SEEK MEDICAL CARE IF:   Your condition is not improving after your transfusion.  You develop redness or irritation at the intravenous (IV) site. SEEK IMMEDIATE MEDICAL CARE IF:  Any of the following symptoms occur over the next 12 hours:  Shaking chills.  You have a temperature by mouth above 102 F (38.9 C), not controlled by medicine.  Chest, back, or muscle pain.  People around you feel you are not acting correctly or are confused.  Shortness of breath or difficulty breathing.  Dizziness and fainting.  You get a rash or develop hives.  You have a decrease in urine output.  Your urine turns a dark color or changes to pink, red, or brown. Any of the following   symptoms occur over the next 10 days:  You have a temperature by mouth above 102 F (38.9 C), not controlled by medicine.  Shortness of breath.  Weakness after normal activity.  The white part of the eye turns yellow (jaundice).  You have a decrease in the amount of urine or are urinating less often.  Your  urine turns a dark color or changes to pink, red, or brown. Document Released: 07/01/2000 Document Revised: 09/26/2011 Document Reviewed: 02/18/2008 Exit[employer] Patient Information 2014 [employer].  

## 2013-06-30 LAB — TYPE AND SCREEN
Antibody Screen: NEGATIVE
Unit division: 0

## 2013-07-02 ENCOUNTER — Encounter: Payer: Self-pay | Admitting: Genetic Counselor

## 2013-07-02 ENCOUNTER — Telehealth: Payer: Self-pay | Admitting: Genetic Counselor

## 2013-07-02 NOTE — Telephone Encounter (Signed)
Revealed negative test results but with an ATM VUS.

## 2013-07-03 ENCOUNTER — Encounter: Payer: Medicare Other | Admitting: Physical Therapy

## 2013-07-03 ENCOUNTER — Ambulatory Visit: Payer: Medicare Other | Admitting: Physical Therapy

## 2013-07-04 ENCOUNTER — Other Ambulatory Visit: Payer: Self-pay | Admitting: Emergency Medicine

## 2013-07-04 DIAGNOSIS — C50412 Malignant neoplasm of upper-outer quadrant of left female breast: Secondary | ICD-10-CM

## 2013-07-05 ENCOUNTER — Ambulatory Visit (HOSPITAL_BASED_OUTPATIENT_CLINIC_OR_DEPARTMENT_OTHER): Payer: Medicare Other | Admitting: Adult Health

## 2013-07-05 ENCOUNTER — Ambulatory Visit: Payer: Medicare Other | Admitting: Physical Therapy

## 2013-07-05 ENCOUNTER — Ambulatory Visit (HOSPITAL_BASED_OUTPATIENT_CLINIC_OR_DEPARTMENT_OTHER): Payer: Medicare Other

## 2013-07-05 ENCOUNTER — Other Ambulatory Visit: Payer: Medicare Other | Admitting: Lab

## 2013-07-05 ENCOUNTER — Other Ambulatory Visit (HOSPITAL_BASED_OUTPATIENT_CLINIC_OR_DEPARTMENT_OTHER): Payer: Medicare Other

## 2013-07-05 ENCOUNTER — Other Ambulatory Visit: Payer: Self-pay | Admitting: *Deleted

## 2013-07-05 ENCOUNTER — Encounter: Payer: Self-pay | Admitting: Adult Health

## 2013-07-05 VITALS — BP 178/76 | HR 91 | Temp 97.9°F | Resp 18 | Ht 62.0 in | Wt 211.0 lb

## 2013-07-05 DIAGNOSIS — C50412 Malignant neoplasm of upper-outer quadrant of left female breast: Secondary | ICD-10-CM

## 2013-07-05 DIAGNOSIS — C50419 Malignant neoplasm of upper-outer quadrant of unspecified female breast: Secondary | ICD-10-CM

## 2013-07-05 DIAGNOSIS — Z5111 Encounter for antineoplastic chemotherapy: Secondary | ICD-10-CM

## 2013-07-05 DIAGNOSIS — R609 Edema, unspecified: Secondary | ICD-10-CM

## 2013-07-05 LAB — COMPREHENSIVE METABOLIC PANEL (CC13)
Albumin: 3.7 g/dL (ref 3.5–5.0)
Anion Gap: 13 mEq/L — ABNORMAL HIGH (ref 3–11)
BUN: 8.6 mg/dL (ref 7.0–26.0)
CO2: 25 mEq/L (ref 22–29)
Glucose: 193 mg/dl — ABNORMAL HIGH (ref 70–140)
Potassium: 3.4 mEq/L — ABNORMAL LOW (ref 3.5–5.1)
Sodium: 141 mEq/L (ref 136–145)
Total Bilirubin: 0.3 mg/dL (ref 0.20–1.20)
Total Protein: 6.8 g/dL (ref 6.4–8.3)

## 2013-07-05 LAB — CBC WITH DIFFERENTIAL/PLATELET
Basophils Absolute: 0.1 10*3/uL (ref 0.0–0.1)
Eosinophils Absolute: 0.1 10*3/uL (ref 0.0–0.5)
HCT: 36.3 % (ref 34.8–46.6)
HGB: 11.8 g/dL (ref 11.6–15.9)
LYMPH%: 8.4 % — ABNORMAL LOW (ref 14.0–49.7)
MONO#: 1.1 10*3/uL — ABNORMAL HIGH (ref 0.1–0.9)
NEUT#: 11.4 10*3/uL — ABNORMAL HIGH (ref 1.5–6.5)
NEUT%: 82.3 % — ABNORMAL HIGH (ref 38.4–76.8)
Platelets: 230 10*3/uL (ref 145–400)
RBC: 3.91 10*6/uL (ref 3.70–5.45)
WBC: 13.9 10*3/uL — ABNORMAL HIGH (ref 3.9–10.3)
lymph#: 1.2 10*3/uL (ref 0.9–3.3)

## 2013-07-05 LAB — HOLD TUBE, BLOOD BANK

## 2013-07-05 MED ORDER — DEXAMETHASONE SODIUM PHOSPHATE 20 MG/5ML IJ SOLN
12.0000 mg | Freq: Once | INTRAMUSCULAR | Status: AC
  Administered 2013-07-05: 12 mg via INTRAVENOUS

## 2013-07-05 MED ORDER — PALONOSETRON HCL INJECTION 0.25 MG/5ML
INTRAVENOUS | Status: AC
  Filled 2013-07-05: qty 5

## 2013-07-05 MED ORDER — SODIUM CHLORIDE 0.9 % IJ SOLN
10.0000 mL | INTRAMUSCULAR | Status: DC | PRN
  Administered 2013-07-05: 10 mL
  Filled 2013-07-05: qty 10

## 2013-07-05 MED ORDER — FUROSEMIDE 10 MG/ML IJ SOLN
20.0000 mg | Freq: Once | INTRAMUSCULAR | Status: AC
  Administered 2013-07-05: 20 mg via INTRAVENOUS

## 2013-07-05 MED ORDER — FLUOROURACIL CHEMO INJECTION 2.5 GM/50ML
500.0000 mg/m2 | Freq: Once | INTRAVENOUS | Status: AC
  Administered 2013-07-05: 1000 mg via INTRAVENOUS
  Filled 2013-07-05: qty 20

## 2013-07-05 MED ORDER — DEXAMETHASONE SODIUM PHOSPHATE 20 MG/5ML IJ SOLN
INTRAMUSCULAR | Status: AC
  Filled 2013-07-05: qty 5

## 2013-07-05 MED ORDER — SODIUM CHLORIDE 0.9 % IV SOLN
Freq: Once | INTRAVENOUS | Status: AC
  Administered 2013-07-05: 11:00:00 via INTRAVENOUS

## 2013-07-05 MED ORDER — SODIUM CHLORIDE 0.9 % IV SOLN
150.0000 mg | Freq: Once | INTRAVENOUS | Status: AC
  Administered 2013-07-05: 150 mg via INTRAVENOUS
  Filled 2013-07-05: qty 5

## 2013-07-05 MED ORDER — HEPARIN SOD (PORK) LOCK FLUSH 100 UNIT/ML IV SOLN
500.0000 [IU] | Freq: Once | INTRAVENOUS | Status: AC | PRN
  Administered 2013-07-05: 500 [IU]
  Filled 2013-07-05: qty 5

## 2013-07-05 MED ORDER — EPIRUBICIN HCL CHEMO IV INJECTION 200 MG/100ML
100.0000 mg/m2 | Freq: Once | INTRAVENOUS | Status: AC
  Administered 2013-07-05: 200 mg via INTRAVENOUS
  Filled 2013-07-05: qty 100

## 2013-07-05 MED ORDER — CYCLOPHOSPHAMIDE CHEMO INJECTION 1 GM
500.0000 mg/m2 | Freq: Once | INTRAMUSCULAR | Status: AC
  Administered 2013-07-05: 1000 mg via INTRAVENOUS
  Filled 2013-07-05: qty 50

## 2013-07-05 MED ORDER — PALONOSETRON HCL INJECTION 0.25 MG/5ML
0.2500 mg | Freq: Once | INTRAVENOUS | Status: AC
  Administered 2013-07-05: 0.25 mg via INTRAVENOUS

## 2013-07-05 NOTE — Patient Instructions (Signed)
  Patient Neutropenia Instruction Sheet  Diagnosis: Breast Cancer      Treating Physician: Drue Second, MD  Treatment: 1. Type of chemotherapy: FEC 2. Date of last treatment: 07/05/13     Prophylactic Antibiotics: Cipro 500 mg by mouth twice a day start on 07/11/13 Instructions: 1. Monitor temperature and call if fever  greater than 100.5, chills, shaking chills (rigors) 2. Call Physician on-call at [phone] 3. Give him/her symptoms and list of medications that you are taking and your last blood count.

## 2013-07-05 NOTE — Patient Instructions (Signed)
[facility] Discharge Instructions for Patients Receiving Chemotherapy  Today you received the following chemotherapy agents Ellence/Cytoxan/5FU.  To help prevent nausea and vomiting after your treatment, we encourage you to take your nausea medication as prescribed.   If you develop nausea and vomiting that is not controlled by your nausea medication, call the clinic.   BELOW ARE SYMPTOMS THAT SHOULD BE REPORTED IMMEDIATELY:  *FEVER GREATER THAN 100.5 F  *CHILLS WITH OR WITHOUT FEVER  NAUSEA AND VOMITING THAT IS NOT CONTROLLED WITH YOUR NAUSEA MEDICATION  *UNUSUAL SHORTNESS OF BREATH  *UNUSUAL BRUISING OR BLEEDING  TENDERNESS IN MOUTH AND THROAT WITH OR WITHOUT PRESENCE OF ULCERS  *URINARY PROBLEMS  *BOWEL PROBLEMS  UNUSUAL RASH Items with * indicate a potential emergency and should be followed up as soon as possible.  Feel free to call the clinic you have any questions or concerns. The clinic phone number is [phone].

## 2013-07-05 NOTE — Progress Notes (Signed)
[facility]  [phone]) [phone] Fax:([phone]  OFFICE PROGRESS NOTE    PATIENT: Kathy Maxwell   DOB: [birth year]  MR#: [id number]  XLK#:[id number]   DG:[id number] N, MD SU:  Claud Kelp, MD   DIAGNOSIS: Kathy Maxwell is a 57 y.o. 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  Treated with adjuvant chemotherapy.   PRIOR THERAPY: 1.  Screening mammogram in 12/2012 was abnormal which led to left digital diagnostic mammogram and left breast ultrasound on 03/01/2013. Ultrasound of the left breast showed a hypoechoic mass at 12:30, 6 cm from the nipple, that measured 1.9 x 2.6 x 2.9 cm.  Ultrasound of the left axilla demonstrates a 1.1 x 0.7 x 0.9 cm lymph node with a borderline thickened cortex.  2.  Left breast needle core biopsy at the 12:30 o'clock position and left needle core biopsy of the left axillary lymph node on 03/07/2013 showed invasive mammary carcinoma with features compatible with a grade 1 or 2 ductal carcinoma in the breast needle core biopsy and lymph node was positive for metastatic mammary carcinoma with a macrometastasis and extracapsular extension identified.  Estrogen receptor 100% positive, progesterone receptor 75% positive, Ki-67 15%, HER-2/neu by CISH no amplification.  3.  Bilateral breast MRI on 03/15/2013 showed in the left breast there were at least 4 focus of abnormal spiculated enhancement in the upper left breast centrally.  The entire area measured 7.4 cm x 6.6 cm x 5.1 cm.  The previous biopsy focus was posteriorly located and measured 2.1 x 1 x 2.1 cm.  The anterior focus that was not biopsied measured 3.1 x 2.6 x 2.6 cm.  A lateral focus that was not biopsied measured 1.1 x 1.4 x 1.6 cm.  There were  small medial focus measuring cyst 0.7 x 0.8 x 0.7 cm  Post biopsy changes were identified in the left axilla.  There were abnormal enlarged lymph nodes correlating to the patient's known metastatic neoplasm to the lymph nodes which the largest lymph node measured 1.8 x 0.8 cm.  In the right breast there was an irregular focus of 3.3 x 3.1 x 1.3 cm abnormal enhancement in the central right breast in the middle one third.  There was a mild thickened cortex and prominent lymph nodes within the right axilla with the largest measuring 1.6 x 1 cm.  There was a posterior central upper breast focus with associated clip artifact measuring 1.2 x 2.2 x 1 cm previously biopsied to be benign.  Suspicious findings in bilateral breasts.  4.  Anterior left breast needle core biopsy and central right breast needle core biopsy on 04/03/2013.  Left breast needle core biopsy showed invasive ductal carcinoma and ductal carcinoma in situ, 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% positive, progesterone receptor 23% positive, Ki-67 33%, and HER-2/neu by CISH no amplification.  Right breast needle core biopsy showed ductal carcinoma in situ, 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% positive, progesterone receptor 11% positive.  5.  Status post left breast modified radical mastectomy with left axillary lymph node resection and right breast lumpectomy with right axillary node biopsy on 04/22/2013 with pathology results as follows:  Left breast: Stage IIIA, pT3, pN2a, invasive ductal carcinoma with multiple foci, grade 1, largest span was 5.5 cm and ductal carcinoma in situ low-grade, 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% positive,  progesterone receptor 23% positive, HER-2/neu no amplification, Ki-67 33%, with 4/22 metastatic lymph nodes and 1 isolated tumor cells identified within left axillary lymph node resection.  Extracapsular extension was present and surgical resection margins were negative for carcinoma.  Right breast: Stage 0, pTis, pN0, 3.0 cm ductal carcinoma in situ with  calcifications, intermediate to high-grade, 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% positive, progesterone receptor 11% positive, with 0/4 metastatic right axillary lymph nodes.  Reexcision of right breast medial margin: Ductal carcinoma in situ, intermediate grade spanning 0.8 cm, ductal carcinoma in situ is focally less than 0.1 cm from the new medial margin, lobular neoplasia (atypical lobular hyperplasia).  6.  Referred for genetics counseling and testing - appointment scheduled for 06/20/2013.  7.  Adjuvant chemotherapy consisting of FEC (5-FU/epirubicin/Cytoxan)  that will be followed by adjuvant chemotherapy consisting of Taxol.  2-D echocardiogram on 04/18/2013 showed a left ventricular ejection fraction of 55% - 60%.   Started adjuvant chemotherapy with FEC on 05/24/2013.   CURRENT THERAPY:  FEC cycle 3 day 8  INTERVAL HISTORY: Kathy Maxwell is here for followup visit after cycle 3 of FEC. Overall she tolerated it well. However she is tired. She is anemic. She may require blood transfusion and I discussed this with her. I do think that 2 units of packed red cells well help her get better. She has not had any bleeding. She denies any fevers chills night sweats she does have some shortness of breath. Denies having any peripheral paresthesias she does develop aches and pains from the Neulasta injection. She manages this by giving herself Claritin as well as some Tylenol off-and-on. This does help her. She is not had changes in her bowel or bladder habits. Remainder of the 10 point review of systems is negative.  PAST MEDICAL HISTORY: Past Medical History  Diagnosis Date  . Hyperlipidemia   . Hypertension   . Bipolar 1 disorder   . Cancer   . Hypertension 04/04/2013  . Hyperlipidemia 04/04/2013  . Bipolar 1 disorder 04/04/2013  . Breast cancer 03/10/13    left breast invasive ca  . Allergy     latex  . Obesity   . Diabetes mellitus without complication     NIDDM  . Sleep apnea     no cpap due to  insurance  . Heart murmur   . GERD (gastroesophageal reflux disease)   . Coronary artery disease     PAST SURGICAL HISTORY: Past Surgical History  Procedure Laterality Date  . Cholecystectomy    . Breast biopsy Left 05/08/09    fibroadenoma with microcalcifications,no malignancy id  . Breast biopsy Left 03/07/13    invasive ca,1 lymph node metastatic  . 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 Abdominal hysterectomy    . Coronary stent placement  2001  . Cardiac catheterization    . Eye surgery  6/13,9/14    laser,cataract lft  . Coronary angioplasty      LAD stent 2003  . Portacath placement Right 04/22/2013    Procedure: INSERTION PORT-A-CATH;  Surgeon: [name], MD;  Location: [facility] OR;  Service: General;  Laterality: Right;  . Mastectomy modified radical Left 04/22/2013    Procedure: MASTECTOMY MODIFIED RADICAL;  Surgeon: [name], MD;  Location: [facility] OR;  Service: General;  Laterality: Left;  . Breast lumpectomy with needle localization and axillary sentinel lymph node bx Right 04/22/2013    Procedure: BREAST LUMPECTOMY WITH NEEDLE LOCALIZATION AND AXILLARY SENTINEL LYMPH  NODE BX;  Surgeon: [name], MD;  Location: [facility] OR;  Service: General;  Laterality: Right;    FAMILY HISTORY: Family History  Problem Relation Age of Onset  . Heart disease Mother   . Breast cancer Maternal Aunt     dx in her 84s  . Heart disease Cousin 74    maternal cousin  . Heart attack Sister 10  . Cancer Paternal Aunt     NOS  . Cancer Maternal Aunt     NOS  . Lung cancer Cousin 109    maternal cousin - non smoker  . Brain cancer Cousin 13    maternal cousin's son  . Lung cancer Cousin 89    maternal cousin's son  . Breast cancer Cousin     paternal cousin    SOCIAL HISTORY: History  Substance Use Topics  . Smoking status: Former Smoker -- 1.00 packs/day for 15 years    Types: Cigarettes    Quit date: 07/19/1987  .  Smokeless tobacco: Never Used  . Alcohol Use: No    ALLERGIES: Allergies  Allergen Reactions  . Adhesive [Tape] Rash    Clear tape gloves ,elastic no problem     MEDICATIONS:  Current Outpatient Prescriptions  Medication Sig Dispense Refill  . amLODipine (NORVASC) 10 MG tablet Take 10 mg by mouth daily.      . Azilsartan-Chlorthalidone 40-25 MG TABS Take 1 tablet by mouth daily.       [name] buPROPion (WELLBUTRIN SR) 150 MG 12 hr tablet Take 150 mg by mouth daily.       . ciprofloxacin (CIPRO) 500 MG tablet Take 1 tablet (500 mg total) by mouth 2 (two) times daily.  14 tablet  2  . dexamethasone (DECADRON) 4 MG tablet Take 2 tablets by mouth once a day on the day after chemotherapy and then take 2 tablets two times a day for 2 days. Take with food.  30 tablet  1  . hydrALAZINE (APRESOLINE) 25 MG tablet Take 25 mg by mouth 2 (two) times daily.      [name] HYDROcodone-acetaminophen (NORCO/VICODIN) 5-325 MG per tablet Take 1-2 tablets by mouth every 4 (four) hours as needed for pain.  30 tablet  0  . Lancet Devices (EASY TOUCH LANCING DEVICE) MISC       . lidocaine-prilocaine (EMLA) cream Apply topically as needed.  30 g  0  . LITE TOUCH LANCETS MISC       . LORazepam (ATIVAN) 0.5 MG tablet Take 1 tablet (0.5mg ) every 6 hours as needed for nausea or vomiting.  30 tablet  0  . losartan (COZAAR) 100 MG tablet Take 100 mg by mouth daily.       . metoprolol succinate (TOPROL-XL) 25 MG 24 hr tablet Take 25 mg by mouth daily.      . ondansetron (ZOFRAN) 8 MG tablet Take 1 tablet (8mg s) every 12 hours as needed for nausea or vomiting starting the 3rd day after chemotherapy.  30 tablet  1  . ONE TOUCH ULTRA TEST test strip       . pantoprazole (PROTONIX) 40 MG tablet Take 40 mg by mouth daily.      . pioglitazone-metformin (ACTOPLUS MET) 15-850 MG per tablet Take 1 tablet by mouth 2 (two) times daily with a meal.      . prochlorperazine (COMPAZINE) 10 MG tablet Take 1 tablet (10 mg total) by mouth every 6  (six) hours as needed (Nausea or vomiting).  30 tablet  1  . rosuvastatin (CRESTOR) 5 MG tablet Take 5 mg by mouth daily.      . sitaGLIPtin (JANUVIA) 100 MG tablet Take 100 mg by mouth daily.       No current facility-administered medications for this visit.   Facility-Administered Medications Ordered in Other Visits  Medication Dose Route Frequency Provider Last Rate Last Dose  . sodium chloride 0.9 % injection 10 mL  10 mL Intravenous PRN Victorino December, MD   10 mL at 05/24/13 1522      REVIEW OF SYSTEMS: A 10 point review of systems was completed and is negative except as noted above.   PHYSICAL EXAMINATION: BP 182/70  Pulse 102  Temp(Src) 98.4 F (36.9 C) (Oral)  Resp 18  Ht 5\' 2"  (1.575 m)  Wt 214 lb 9.6 oz (97.342 kg)  BMI 39.24 kg/m2  General: Patient is a well appearing female in no acute distress HEENT: PERRLA, sclerae anicteric no conjunctival pallor, MMM Neck: supple, no palpable adenopathy Lungs: clear to auscultation bilaterally, no wheezes, rhonchi, or rales Cardiovascular: regular rate rhythm, S1, S2, no murmurs, rubs or gallops Abdomen: Soft, non-tender, non-distended, normoactive bowel sounds, no HSM Extremities: warm and well perfused, no clubbing, cyanosis, or edema Skin: No rashes or lesions Neuro: Non-focal Breasts:  ECOG FS:  0 - Asymptomatic   LAB RESULTS: Lab Results  Component Value Date   WBC 3.5* 06/28/2013   NEUTROABS 2.3 06/28/2013   HGB 7.7* 06/28/2013   HCT 23.8* 06/28/2013   MCV 92.6 06/28/2013   PLT 195 06/28/2013      Chemistry      Component Value Date/Time   NA 140 06/28/2013 1308   NA 134* 04/24/2013 0550   K 3.5 06/28/2013 1308   K 3.8 04/24/2013 0550   CL 101 04/24/2013 0550   CO2 25 06/28/2013 1308   CO2 25 04/24/2013 0550   BUN 10.5 06/28/2013 1308   BUN 19 04/24/2013 0550   CREATININE 0.8 06/28/2013 1308   CREATININE 0.98 04/24/2013 0550      Component Value Date/Time   CALCIUM 9.1 06/28/2013 1308   CALCIUM 8.3*  04/24/2013 0550   ALKPHOS 120 06/28/2013 1308   AST 8 06/28/2013 1308   ALT 9 06/28/2013 1308   BILITOT <0.20 06/28/2013 1308       No results found for this basename: [id number]    No components found with this basename: [id number]     RADIOGRAPHIC STUDIES: 1.    2-D echocardiogram on 04/18/2013 showed a left ventricular ejection fraction of 55% - 60%    ASSESSMENT:  Kathy Maxwell is a  57 y.o. woman:  1.  Status post left breast modified radical mastectomy with left axillary lymph node resection and right breast lumpectomy with right axillary node biopsy on 04/22/2013 with pathology results as follows:  Left breast: Stage IIIA, pT3, pN2a, invasive ductal carcinoma with multiple foci, grade 1, largest span was 5.5 cm and ductal carcinoma in situ low-grade, 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% positive, progesterone receptor 23% positive, HER-2/neu no amplification, Ki-67 33%, with 4/22 metastatic lymph nodes and 1 isolated tumor cells identified within left axillary lymph node resection.  Extracapsular extension was present and surgical resection margins were negative for carcinoma.  Right breast: Stage 0, pTis, pN0, 3.0 cm ductal carcinoma in situ with calcifications, intermediate to high-grade, 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% positive, progesterone receptor 11% positive, with 0/4 metastatic right axillary lymph nodes.  Reexcision of right breast medial margin: Ductal carcinoma in situ, intermediate grade  spanning 0.8 cm, ductal carcinoma in situ is focally less than 0.1 cm from the new medial margin, lobular neoplasia (atypical lobular hyperplasia).  2..  Referred for genetics counseling/testing - appointment scheduled for 06/20/2013.  3.  Adjuvant chemotherapy consisting of FEC (5-FU/Epirubicin/Cytoxan)  that will be followed by adjuvant chemotherapy consisting of Taxol.  2-D echocardiogram on 04/18/2013 showed a left ventricular ejection fraction of 55% - 60%.   Started adjuvant chemotherapy with FEC on  05/24/2013.  #4 anemia due to chemotherapy     PLAN: #1 patient will proceed with 2 units of packed red cells tomorrow. She will be premedicated with Tylenol and Benadryl.  #2 patient will receive back in one week's time for followup and next cycle 4 of chemotherapy.  All questions were answered.  Ms. Reader was encouraged to contact us in the interim with any problems, questions or concerns  I spent 15 minutes counseling the patient face to face.  The total time spent in the appointment was 30 minutes.  Drue Second, MD Medical/Oncology [facility] [phone] (beeper) [phone] (Office)  07/05/2013, 12:53 AM

## 2013-07-05 NOTE — Progress Notes (Signed)
[facility]  [phone] [phone]) [phone]  OFFICE PROGRESS NOTE    PATIENT: Kathy Maxwell   DOB: 1955/07/19  MR#: [id number]  KGM#:[id number]   UY:[id number] N, MD SU:  Claud Kelp, MD   DIAGNOSIS: Kathy Maxwell is a 57 y.o. 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  Treated with adjuvant chemotherapy.   PRIOR THERAPY: 1.  Screening mammogram in 12/2012 was abnormal which led to left digital diagnostic mammogram and left breast ultrasound on 03/01/2013. Ultrasound of the left breast showed a hypoechoic mass at 12:30, 6 cm from the nipple, that measured 1.9 x 2.6 x 2.9 cm.  Ultrasound of the left axilla demonstrates a 1.1 x 0.7 x 0.9 cm lymph node with a borderline thickened cortex.  2.  Left breast needle core biopsy at the 12:30 o'clock position and left needle core biopsy of the left axillary lymph node on 03/07/2013 showed invasive mammary carcinoma with features compatible with a grade 1 or 2 ductal carcinoma in the breast needle core biopsy and lymph node was positive for metastatic mammary carcinoma with a macrometastasis and extracapsular extension identified.  Estrogen receptor 100% positive, progesterone receptor 75% positive, Ki-67 15%, HER-2/neu by CISH no amplification.  3.  Bilateral breast MRI on 03/15/2013 showed in the left breast there were at least 4 focus of abnormal spiculated enhancement in the upper left breast centrally.  The entire area measured 7.4 cm x 6.6 cm x 5.1 cm.  The previous biopsy focus was posteriorly located and measured 2.1 x 1 x 2.1 cm.  The anterior focus that was not biopsied measured 3.1 x 2.6 x 2.6 cm.  A lateral focus that was not biopsied measured 1.1 x 1.4 x 1.6 cm.  There were  small medial focus measuring cyst 0.7 x 0.8 x 0.7 cm  Post biopsy changes were identified in the left axilla.  There were abnormal enlarged lymph nodes correlating to the patient's known metastatic neoplasm to the lymph nodes which the largest lymph node measured 1.8 x 0.8 cm.  In the right breast there was an irregular focus of 3.3 x 3.1 x 1.3 cm abnormal enhancement in the central right breast in the middle one third.  There was a mild thickened cortex and prominent lymph nodes within the right axilla with the largest measuring 1.6 x 1 cm.  There was a posterior central upper breast focus with associated clip artifact measuring 1.2 x 2.2 x 1 cm previously biopsied to be benign.  Suspicious findings in bilateral breasts.  4.  Anterior left breast needle core biopsy and central right breast needle core biopsy on 04/03/2013.  Left breast needle core biopsy showed invasive ductal carcinoma and ductal carcinoma in situ, 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% positive, progesterone receptor 23% positive, Ki-67 33%, and HER-2/neu by CISH no amplification.  Right breast needle core biopsy showed ductal carcinoma in situ, 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% positive, progesterone receptor 11% positive.  5.  Status post left breast modified radical mastectomy with left axillary lymph node resection and right breast lumpectomy with right axillary node biopsy on 04/22/2013 with pathology results as follows:  Left breast: Stage IIIA, pT3, pN2a, invasive ductal carcinoma with multiple foci, grade 1, largest span was 5.5 cm and ductal carcinoma in situ low-grade, 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% positive,  progesterone receptor 23% positive, HER-2/neu no amplification, Ki-67 33%, with 4/22 metastatic lymph nodes and 1 isolated tumor cells identified within left axillary lymph node resection.  Extracapsular extension was present and surgical resection margins were negative for carcinoma.  Right breast: Stage 0, pTis, pN0, 3.0 cm ductal carcinoma in situ with  calcifications, intermediate to high-grade, 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% positive, progesterone receptor 11% positive, with 0/4 metastatic right axillary lymph nodes.  Reexcision of right breast medial margin: Ductal carcinoma in situ, intermediate grade spanning 0.8 cm, ductal carcinoma in situ is focally less than 0.1 cm from the new medial margin, lobular neoplasia (atypical lobular hyperplasia).  6.  Referred for genetics counseling and testing - appointment scheduled for 06/20/2013.  7.  Adjuvant chemotherapy consisting of FEC (5-FU/epirubicin/Cytoxan)  that will be followed by adjuvant chemotherapy consisting of Taxol.  2-D echocardiogram on 04/18/2013 showed a left ventricular ejection fraction of 55% - 60%.   Started adjuvant chemotherapy with FEC on 05/24/2013.   CURRENT THERAPY:  FEC cycle 4 day 1  INTERVAL HISTORY: Kathy Maxwell is here for followup visit prior to receiving cycle 4 of FEC. She is doing well today.  She does have some mild swelling in her feet.  She has not gained any weight.  She denies shortness of breath, DOE, palpitations, chest pain, PND, fevers, chills, nausea, vomiting, constipation, diarrhea, or any further concerns.  She received 2 units of PRBCs last week and tolerated them without difficulty.    PAST MEDICAL HISTORY: Past Medical History  Diagnosis Date  . Hyperlipidemia   . Hypertension   . Bipolar 1 disorder   . Cancer   . Hypertension 04/04/2013  . Hyperlipidemia 04/04/2013  . Bipolar 1 disorder 04/04/2013  . Breast cancer 03/10/13    left breast invasive ca  . Allergy     latex  . Obesity   . Diabetes mellitus without complication     NIDDM  . Sleep apnea     no cpap due to insurance  . Heart murmur   . GERD (gastroesophageal reflux disease)   . Coronary artery disease     PAST SURGICAL HISTORY: Past Surgical History  Procedure Laterality Date  . Cholecystectomy    . Breast biopsy Left 05/08/09    fibroadenoma with microcalcifications,no  malignancy id  . Breast biopsy Left 03/07/13    invasive ca,1 lymph node metastatic  . 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 Abdominal hysterectomy    . Coronary stent placement  2001  . Cardiac catheterization    . Eye surgery  6/13,9/14    laser,cataract lft  . Coronary angioplasty      LAD stent 2003  . Portacath placement Right 04/22/2013    Procedure: INSERTION PORT-A-CATH;  Surgeon: [name], MD;  Location: [facility] OR;  Service: General;  Laterality: Right;  . Mastectomy modified radical Left 04/22/2013    Procedure: MASTECTOMY MODIFIED RADICAL;  Surgeon: [name], MD;  Location: [facility] OR;  Service: General;  Laterality: Left;  . Breast lumpectomy with needle localization and axillary sentinel lymph node bx Right 04/22/2013    Procedure: BREAST LUMPECTOMY WITH NEEDLE LOCALIZATION AND AXILLARY SENTINEL LYMPH NODE BX;  Surgeon: [name], MD;  Location: [facility] OR;  Service: General;  Laterality: Right;    FAMILY HISTORY: Family History  Problem Relation Age of Onset  . Heart disease Mother   . Breast cancer Maternal Aunt     dx in her  60s  . Heart disease Cousin 70    maternal cousin  . Heart attack Sister 9  . Cancer Paternal Aunt     NOS  . Cancer Maternal Aunt     NOS  . Lung cancer Cousin 86    maternal cousin - non smoker  . Brain cancer Cousin 13    maternal cousin's son  . Lung cancer Cousin 84    maternal cousin's son  . Breast cancer Cousin     paternal cousin    SOCIAL HISTORY: History  Substance Use Topics  . Smoking status: Former Smoker -- 1.00 packs/day for 15 years    Types: Cigarettes    Quit date: 07/19/1987  . Smokeless tobacco: Never Used  . Alcohol Use: No    ALLERGIES: Allergies  Allergen Reactions  . Adhesive [Tape] Rash    Clear tape gloves ,elastic no problem     MEDICATIONS:  Current Outpatient Prescriptions  Medication Sig Dispense Refill  . amLODipine (NORVASC)  10 MG tablet Take 10 mg by mouth daily.      . Azilsartan-Chlorthalidone 40-25 MG TABS Take 1 tablet by mouth daily.       [name] buPROPion (WELLBUTRIN SR) 150 MG 12 hr tablet Take 150 mg by mouth daily.       . ciprofloxacin (CIPRO) 500 MG tablet Take 1 tablet (500 mg total) by mouth 2 (two) times daily.  14 tablet  2  . dexamethasone (DECADRON) 4 MG tablet Take 2 tablets by mouth once a day on the day after chemotherapy and then take 2 tablets two times a day for 2 days. Take with food.  30 tablet  1  . hydrALAZINE (APRESOLINE) 25 MG tablet Take 25 mg by mouth 2 (two) times daily.      [name] HYDROcodone-acetaminophen (NORCO/VICODIN) 5-325 MG per tablet Take 1-2 tablets by mouth every 4 (four) hours as needed for pain.  30 tablet  0  . Lancet Devices (EASY TOUCH LANCING DEVICE) MISC       . lidocaine-prilocaine (EMLA) cream Apply topically as needed.  30 g  0  . LITE TOUCH LANCETS MISC       . LORazepam (ATIVAN) 0.5 MG tablet Take 1 tablet (0.5mg ) every 6 hours as needed for nausea or vomiting.  30 tablet  0  . losartan (COZAAR) 100 MG tablet Take 100 mg by mouth daily.       . metoprolol succinate (TOPROL-XL) 25 MG 24 hr tablet Take 25 mg by mouth daily.      . ondansetron (ZOFRAN) 8 MG tablet Take 1 tablet (8mg s) every 12 hours as needed for nausea or vomiting starting the 3rd day after chemotherapy.  30 tablet  1  . ONE TOUCH ULTRA TEST test strip       . pantoprazole (PROTONIX) 40 MG tablet Take 40 mg by mouth daily.      . pioglitazone-metformin (ACTOPLUS MET) 15-850 MG per tablet Take 1 tablet by mouth 2 (two) times daily with a meal.      . prochlorperazine (COMPAZINE) 10 MG tablet Take 1 tablet (10 mg total) by mouth every 6 (six) hours as needed (Nausea or vomiting).  30 tablet  1  . rosuvastatin (CRESTOR) 5 MG tablet Take 5 mg by mouth daily.      . sitaGLIPtin (JANUVIA) 100 MG tablet Take 100 mg by mouth daily.       No current facility-administered medications for this visit.    Facility-Administered Medications Ordered in  Other Visits  Medication Dose Route Frequency Provider Last Rate Last Dose  . sodium chloride 0.9 % injection 10 mL  10 mL Intravenous PRN Victorino December, MD   10 mL at 05/24/13 1522      REVIEW OF SYSTEMS: A 10 point review of systems was completed and is negative except as noted above.   PHYSICAL EXAMINATION: BP 178/76  Pulse 91  Temp(Src) 97.9 F (36.6 C) (Oral)  Resp 18  Ht 5\' 2"  (1.575 m)  Wt 211 lb (95.709 kg)  BMI 38.58 kg/m2  General: Patient is a well appearing female in no acute distress HEENT: PERRLA, sclerae anicteric no conjunctival pallor, MMM Neck: supple, no palpable adenopathy Lungs: clear to auscultation bilaterally, no wheezes, rhonchi, or rales Cardiovascular: regular rate rhythm, S1, S2, no murmurs, rubs or gallops Abdomen: Soft, non-tender, non-distended, normoactive bowel sounds, no HSM Extremities: warm and well perfused, no clubbing, cyanosis, or edema Skin: No rashes or lesions Neuro: Non-focal Breasts: deferred ECOG FS:  0 - Asymptomatic   LAB RESULTS: Lab Results  Component Value Date   WBC 13.9* 07/05/2013   NEUTROABS 11.4* 07/05/2013   HGB 11.8 07/05/2013   HCT 36.3 07/05/2013   MCV 92.9 07/05/2013   PLT 230 07/05/2013      Chemistry      Component Value Date/Time   NA 141 07/05/2013 1006   NA 134* 04/24/2013 0550   K 3.4* 07/05/2013 1006   K 3.8 04/24/2013 0550   CL 101 04/24/2013 0550   CO2 25 07/05/2013 1006   CO2 25 04/24/2013 0550   BUN 8.6 07/05/2013 1006   BUN 19 04/24/2013 0550   CREATININE 0.8 07/05/2013 1006   CREATININE 0.98 04/24/2013 0550      Component Value Date/Time   CALCIUM 9.6 07/05/2013 1006   CALCIUM 8.3* 04/24/2013 0550   ALKPHOS 120 07/05/2013 1006   AST 12 07/05/2013 1006   ALT 9 07/05/2013 1006   BILITOT 0.30 07/05/2013 1006       No results found for this basename: [id number]    No components found with this basename: [id number]     RADIOGRAPHIC  STUDIES: 1.    2-D echocardiogram on 04/18/2013 showed a left ventricular ejection fraction of 55% - 60%    ASSESSMENT:  Kathy Maxwell is a  57 y.o. woman:  1.  Status post left breast modified radical mastectomy with left axillary lymph node resection and right breast lumpectomy with right axillary node biopsy on 04/22/2013 with pathology results as follows:  Left breast: Stage IIIA, pT3, pN2a, invasive ductal carcinoma with multiple foci, grade 1, largest span was 5.5 cm and ductal carcinoma in situ low-grade, 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% positive, progesterone receptor 23% positive, HER-2/neu no amplification, Ki-67 33%, with 4/22 metastatic lymph nodes and 1 isolated tumor cells identified within left axillary lymph node resection.  Extracapsular extension was present and surgical resection margins were negative for carcinoma.  Right breast: Stage 0, pTis, pN0, 3.0 cm ductal carcinoma in situ with calcifications, intermediate to high-grade, 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% positive, progesterone receptor 11% positive, with 0/4 metastatic right axillary lymph nodes.  Reexcision of right breast medial margin: Ductal carcinoma in situ, intermediate grade spanning 0.8 cm, ductal carcinoma in situ is focally less than 0.1 cm from the new medial margin, lobular neoplasia (atypical lobular hyperplasia).  2..  Referred for genetics counseling/testing - appointment scheduled for 06/20/2013.  3.  Adjuvant chemotherapy consisting of FEC (5-FU/Epirubicin/Cytoxan)  that will be followed by adjuvant chemotherapy  consisting of Taxol.  2-D echocardiogram on 04/18/2013 showed a left ventricular ejection fraction of 55% - 60%.   Started adjuvant chemotherapy with FEC on 05/24/2013.  #4 anemia due to chemotherapy  PLAN: #1 Patient is doing well today.  Her labs are stable and she will proceed with chemotherapy.    #2 She is mildly hypertensive today.  We will give her furosemide in the treatment room for this and her  swelling.  We will forward her readings to her PCP, and I encouraged her to keep a log of her blood pressures to bring to her PCP.   #3  She will not return for her appointment next week on 12/26 for labs and nadir check due to travel arrangements.  I will give her neutropenic instructions today, and she will start the cipro on Christmas Day.    #4  She will return on 07/19/13 for labs, evaluation, and cycle 5 of chemotherapy.    All questions were answered.  Ms. Galeana was encouraged to contact us in the interim with any problems, questions or concerns  I spent 25 minutes counseling the patient face to face.  The total time spent in the appointment was 30 minutes.  Illa Level, NP Medical Oncology [facility] [phone] 07/06/2013, 8:38 AM

## 2013-07-06 ENCOUNTER — Ambulatory Visit (HOSPITAL_BASED_OUTPATIENT_CLINIC_OR_DEPARTMENT_OTHER): Payer: Medicare Other

## 2013-07-06 VITALS — BP 161/62 | HR 82 | Temp 97.9°F

## 2013-07-06 DIAGNOSIS — C50919 Malignant neoplasm of unspecified site of unspecified female breast: Secondary | ICD-10-CM

## 2013-07-06 DIAGNOSIS — C773 Secondary and unspecified malignant neoplasm of axilla and upper limb lymph nodes: Secondary | ICD-10-CM

## 2013-07-06 DIAGNOSIS — C50412 Malignant neoplasm of upper-outer quadrant of left female breast: Secondary | ICD-10-CM

## 2013-07-06 MED ORDER — PEGFILGRASTIM INJECTION 6 MG/0.6ML
6.0000 mg | Freq: Once | SUBCUTANEOUS | Status: AC
  Administered 2013-07-06: 6 mg via SUBCUTANEOUS

## 2013-07-09 ENCOUNTER — Encounter: Payer: Medicare Other | Admitting: Physical Therapy

## 2013-07-12 ENCOUNTER — Ambulatory Visit: Payer: Medicare Other | Admitting: Adult Health

## 2013-07-12 ENCOUNTER — Other Ambulatory Visit: Payer: Medicare Other

## 2013-07-19 ENCOUNTER — Other Ambulatory Visit (HOSPITAL_BASED_OUTPATIENT_CLINIC_OR_DEPARTMENT_OTHER): Payer: Medicare HMO

## 2013-07-19 ENCOUNTER — Telehealth: Payer: Self-pay | Admitting: Oncology

## 2013-07-19 ENCOUNTER — Ambulatory Visit (HOSPITAL_BASED_OUTPATIENT_CLINIC_OR_DEPARTMENT_OTHER): Payer: Medicare HMO

## 2013-07-19 ENCOUNTER — Other Ambulatory Visit: Payer: Medicare Other

## 2013-07-19 ENCOUNTER — Telehealth: Payer: Self-pay | Admitting: *Deleted

## 2013-07-19 ENCOUNTER — Encounter: Payer: Self-pay | Admitting: Adult Health

## 2013-07-19 ENCOUNTER — Other Ambulatory Visit: Payer: Self-pay | Admitting: Emergency Medicine

## 2013-07-19 ENCOUNTER — Ambulatory Visit (HOSPITAL_BASED_OUTPATIENT_CLINIC_OR_DEPARTMENT_OTHER): Payer: Medicare HMO | Admitting: Adult Health

## 2013-07-19 VITALS — BP 134/71 | HR 80 | Temp 98.2°F | Resp 18 | Ht 62.0 in | Wt 201.3 lb

## 2013-07-19 DIAGNOSIS — Z5111 Encounter for antineoplastic chemotherapy: Secondary | ICD-10-CM

## 2013-07-19 DIAGNOSIS — C773 Secondary and unspecified malignant neoplasm of axilla and upper limb lymph nodes: Secondary | ICD-10-CM

## 2013-07-19 DIAGNOSIS — R110 Nausea: Secondary | ICD-10-CM

## 2013-07-19 DIAGNOSIS — C50412 Malignant neoplasm of upper-outer quadrant of left female breast: Secondary | ICD-10-CM

## 2013-07-19 DIAGNOSIS — C50919 Malignant neoplasm of unspecified site of unspecified female breast: Secondary | ICD-10-CM

## 2013-07-19 LAB — CBC WITH DIFFERENTIAL/PLATELET
BASO%: 1.4 % (ref 0.0–2.0)
Basophils Absolute: 0.1 10*3/uL (ref 0.0–0.1)
EOS ABS: 0 10*3/uL (ref 0.0–0.5)
EOS%: 0.4 % (ref 0.0–7.0)
HEMATOCRIT: 33.9 % — AB (ref 34.8–46.6)
HGB: 11.1 g/dL — ABNORMAL LOW (ref 11.6–15.9)
LYMPH%: 22.3 % (ref 14.0–49.7)
MCH: 30.4 pg (ref 25.1–34.0)
MCHC: 32.8 g/dL (ref 31.5–36.0)
MCV: 92.9 fL (ref 79.5–101.0)
MONO#: 0.7 10*3/uL (ref 0.1–0.9)
MONO%: 10.2 % (ref 0.0–14.0)
NEUT#: 4.6 10*3/uL (ref 1.5–6.5)
NEUT%: 65.7 % (ref 38.4–76.8)
PLATELETS: 247 10*3/uL (ref 145–400)
RBC: 3.65 10*6/uL — AB (ref 3.70–5.45)
RDW: 16.1 % — ABNORMAL HIGH (ref 11.2–14.5)
WBC: 7.1 10*3/uL (ref 3.9–10.3)
lymph#: 1.6 10*3/uL (ref 0.9–3.3)

## 2013-07-19 LAB — COMPREHENSIVE METABOLIC PANEL (CC13)
ALK PHOS: 99 U/L (ref 40–150)
ALT: 14 U/L (ref 0–55)
ANION GAP: 10 meq/L (ref 3–11)
AST: 14 U/L (ref 5–34)
Albumin: 3.8 g/dL (ref 3.5–5.0)
BILIRUBIN TOTAL: 0.36 mg/dL (ref 0.20–1.20)
BUN: 9.3 mg/dL (ref 7.0–26.0)
CO2: 25 meq/L (ref 22–29)
CREATININE: 1 mg/dL (ref 0.6–1.1)
Calcium: 9.6 mg/dL (ref 8.4–10.4)
Chloride: 104 mEq/L (ref 98–109)
GLUCOSE: 155 mg/dL — AB (ref 70–140)
Potassium: 4.1 mEq/L (ref 3.5–5.1)
SODIUM: 139 meq/L (ref 136–145)
Total Protein: 6.8 g/dL (ref 6.4–8.3)

## 2013-07-19 MED ORDER — DEXAMETHASONE SODIUM PHOSPHATE 20 MG/5ML IJ SOLN
12.0000 mg | Freq: Once | INTRAMUSCULAR | Status: AC
  Administered 2013-07-19: 12 mg via INTRAVENOUS

## 2013-07-19 MED ORDER — SODIUM CHLORIDE 0.9 % IJ SOLN
10.0000 mL | INTRAMUSCULAR | Status: DC | PRN
  Administered 2013-07-19: 10 mL
  Filled 2013-07-19: qty 10

## 2013-07-19 MED ORDER — HEPARIN SOD (PORK) LOCK FLUSH 100 UNIT/ML IV SOLN
500.0000 [IU] | Freq: Once | INTRAVENOUS | Status: AC | PRN
  Administered 2013-07-19: 500 [IU]
  Filled 2013-07-19: qty 5

## 2013-07-19 MED ORDER — DEXAMETHASONE SODIUM PHOSPHATE 20 MG/5ML IJ SOLN
INTRAMUSCULAR | Status: AC
  Filled 2013-07-19: qty 5

## 2013-07-19 MED ORDER — FLUOROURACIL CHEMO INJECTION 2.5 GM/50ML
500.0000 mg/m2 | Freq: Once | INTRAVENOUS | Status: AC
  Administered 2013-07-19: 1000 mg via INTRAVENOUS
  Filled 2013-07-19: qty 20

## 2013-07-19 MED ORDER — CYCLOPHOSPHAMIDE CHEMO INJECTION 1 GM
500.0000 mg/m2 | Freq: Once | INTRAMUSCULAR | Status: AC
  Administered 2013-07-19: 1000 mg via INTRAVENOUS
  Filled 2013-07-19: qty 50

## 2013-07-19 MED ORDER — LORAZEPAM 1 MG PO TABS
ORAL_TABLET | ORAL | Status: AC
  Filled 2013-07-19: qty 1

## 2013-07-19 MED ORDER — EPIRUBICIN HCL CHEMO IV INJECTION 200 MG/100ML
100.0000 mg/m2 | Freq: Once | INTRAVENOUS | Status: AC
  Administered 2013-07-19: 200 mg via INTRAVENOUS
  Filled 2013-07-19: qty 100

## 2013-07-19 MED ORDER — SODIUM CHLORIDE 0.9 % IV SOLN
Freq: Once | INTRAVENOUS | Status: AC
  Administered 2013-07-19: 16:00:00 via INTRAVENOUS

## 2013-07-19 MED ORDER — LORAZEPAM 1 MG PO TABS
0.5000 mg | ORAL_TABLET | Freq: Once | ORAL | Status: DC
  Administered 2013-07-19: 0.5 mg via ORAL

## 2013-07-19 MED ORDER — PALONOSETRON HCL INJECTION 0.25 MG/5ML
0.2500 mg | Freq: Once | INTRAVENOUS | Status: AC
  Administered 2013-07-19: 0.25 mg via INTRAVENOUS

## 2013-07-19 MED ORDER — PALONOSETRON HCL INJECTION 0.25 MG/5ML
INTRAVENOUS | Status: AC
  Filled 2013-07-19: qty 5

## 2013-07-19 MED ORDER — SODIUM CHLORIDE 0.9 % IV SOLN
150.0000 mg | Freq: Once | INTRAVENOUS | Status: AC
  Administered 2013-07-19: 150 mg via INTRAVENOUS
  Filled 2013-07-19: qty 5

## 2013-07-19 NOTE — Progress Notes (Signed)
[place]  [phone] [phone]  OFFICE PROGRESS NOTE    PATIENT: Kathy Maxwell   DOB: June 22, 1956  MR#: [id number]  HYQ#:[id number]   XB:[id number] N, MD SU:  Fanny Skates, MD   DIAGNOSIS: Kathy Maxwell is a 58 y.o. 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  Treated with adjuvant chemotherapy.   PRIOR THERAPY: 1.  Screening mammogram in 12/2012 was abnormal which led to left digital diagnostic mammogram and left breast ultrasound on 03/01/2013. Ultrasound of the left breast showed a hypoechoic mass at 12:30, 6 cm from the nipple, that measured 1.9 x 2.6 x 2.9 cm.  Ultrasound of the left axilla demonstrates a 1.1 x 0.7 x 0.9 cm lymph node with a borderline thickened cortex.  2.  Left breast needle core biopsy at the 12:30 o'clock position and left needle core biopsy of the left axillary lymph node on 03/07/2013 showed invasive mammary carcinoma with features compatible with a grade 1 or 2 ductal carcinoma in the breast needle core biopsy and lymph node was positive for metastatic mammary carcinoma with a macrometastasis and extracapsular extension identified.  Estrogen receptor 100% positive, progesterone receptor 75% positive, Ki-67 15%, HER-2/neu by CISH no amplification.  3.  Bilateral breast MRI on 03/15/2013 showed in the left breast there were at least 4 focus of abnormal spiculated enhancement in the upper left breast centrally.  The entire area measured 7.4 cm x 6.6 cm x 5.1 cm.  The previous biopsy focus was posteriorly located and measured 2.1 x 1 x 2.1 cm.  The anterior focus that was not biopsied measured 3.1 x 2.6 x 2.6 cm.  A lateral focus that was not biopsied measured 1.1 x 1.4 x 1.6 cm.  There were  small medial focus measuring cyst 0.7 x 0.8 x 0.7 cm  Post biopsy changes were identified in the left axilla.  There were abnormal enlarged lymph nodes correlating to the patient's known metastatic neoplasm to the lymph nodes which the largest lymph node measured 1.8 x 0.8 cm.  In the right breast there was an irregular focus of 3.3 x 3.1 x 1.3 cm abnormal enhancement in the central right breast in the middle one third.  There was a mild thickened cortex and prominent lymph nodes within the right axilla with the largest measuring 1.6 x 1 cm.  There was a posterior central upper breast focus with associated clip artifact measuring 1.2 x 2.2 x 1 cm previously biopsied to be benign.  Suspicious findings in bilateral breasts.  4.  Anterior left breast needle core biopsy and central right breast needle core biopsy on 04/03/2013.  Left breast needle core biopsy showed invasive ductal carcinoma and ductal carcinoma in situ, 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% positive, progesterone receptor 23% positive, Ki-67 33%, and HER-2/neu by CISH no amplification.  Right breast needle core biopsy showed ductal carcinoma in situ, 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% positive, progesterone receptor 11% positive.  5.  Status post left breast modified radical mastectomy with left axillary lymph node resection and right breast lumpectomy with right axillary node biopsy on 04/22/2013 with pathology results as follows:  Left breast: Stage IIIA, pT3, pN2a, invasive ductal carcinoma with multiple foci, grade 1, largest span was 5.5 cm and ductal carcinoma in situ low-grade, 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% positive,  progesterone receptor 23% positive, HER-2/neu no amplification, Ki-67 33%, with 4/22 metastatic lymph nodes and 1 isolated tumor cells identified within left axillary lymph node resection.  Extracapsular extension was present and surgical resection margins were negative for carcinoma.  Right breast: Stage 0, pTis, pN0, 3.0 cm ductal carcinoma in situ with  calcifications, intermediate to high-grade, 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% positive, progesterone receptor 11% positive, with 0/4 metastatic right axillary lymph nodes.  Reexcision of right breast medial margin: Ductal carcinoma in situ, intermediate grade spanning 0.8 cm, ductal carcinoma in situ is focally less than 0.1 cm from the new medial margin, lobular neoplasia (atypical lobular hyperplasia).  6.  Referred for genetics counseling and testing - appointment scheduled for 06/20/2013.  7.  Adjuvant chemotherapy consisting of FEC (5-FU/epirubicin/Cytoxan)  that will be followed by adjuvant chemotherapy consisting of Taxol.  2-D echocardiogram on 04/18/2013 showed a left ventricular ejection fraction of 55% - 60%.   Started adjuvant chemotherapy with FEC on 05/24/2013.  CURRENT THERAPY:  FEC cycle 5 day 1  INTERVAL HISTORY: Kathy Maxwell is here for followup visit prior to receiving cycle 5 of FEC. She is doing well today.  She did not come in last week because she was traveling for the holidays.  Instead, she took Cipro beginning on Christmas day.  She did get a cold, which has now resolved.  She denies fevers, chills, nausea, vomiting, constipation, diarrhea, numbness, skin changes, or any other concerns.   PAST MEDICAL HISTORY: Past Medical History  Diagnosis Date  . Hyperlipidemia   . Hypertension   . Bipolar 1 disorder   . Cancer   . Hypertension 04/04/2013  . Hyperlipidemia 04/04/2013  . Bipolar 1 disorder 04/04/2013  . Breast cancer 03/10/13    left breast invasive ca  . Allergy     latex  . Obesity   . Diabetes mellitus without complication     NIDDM  . Sleep apnea     no cpap due to insurance  . Heart murmur   . GERD (gastroesophageal reflux disease)   . Coronary artery disease     PAST SURGICAL HISTORY: Past Surgical History  Procedure Laterality Date  . Cholecystectomy    . Breast biopsy Left 05/08/09    fibroadenoma with microcalcifications,no malignancy id  . Breast  biopsy Left 03/07/13    invasive ca,1 lymph node metastatic  . 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 Abdominal hysterectomy    . Coronary stent placement  2001  . Cardiac catheterization    . Eye surgery  6/13,9/14    laser,cataract lft  . Coronary angioplasty      LAD stent 2003  . Portacath placement Right 04/22/2013    Procedure: INSERTION PORT-A-CATH;  Surgeon: Adin Hector, MD;  Location: [place];  Service: General;  Laterality: Right;  . Mastectomy modified radical Left 04/22/2013    Procedure: MASTECTOMY MODIFIED RADICAL;  Surgeon: Adin Hector, MD;  Location: [place];  Service: General;  Laterality: Left;  . Breast lumpectomy with needle localization and axillary sentinel lymph node bx Right 04/22/2013    Procedure: BREAST LUMPECTOMY WITH NEEDLE LOCALIZATION AND AXILLARY SENTINEL LYMPH NODE BX;  Surgeon: Adin Hector, MD;  Location: [place];  Service: General;  Laterality: Right;    FAMILY HISTORY: Family History  Problem Relation Age of Onset  . Heart disease Mother   . Breast cancer Maternal Aunt     dx in her 67s  . Heart  disease Cousin 22    maternal cousin  . Heart attack Sister 25  . Cancer Paternal Aunt     NOS  . Cancer Maternal Aunt     NOS  . Lung cancer Cousin 87    maternal cousin - non smoker  . Brain cancer Cousin 13    maternal cousin's son  . Lung cancer Cousin 54    maternal cousin's son  . Breast cancer Cousin     paternal cousin    SOCIAL HISTORY: History  Substance Use Topics  . Smoking status: Former Smoker -- 1.00 packs/day for 15 years    Types: Cigarettes    Quit date: 07/19/1987  . Smokeless tobacco: Never Used  . Alcohol Use: No    ALLERGIES: Allergies  Allergen Reactions  . Adhesive [Tape] Rash    Clear tape gloves ,elastic no problem     MEDICATIONS:  Current Outpatient Prescriptions  Medication Sig Dispense Refill  . amLODipine (NORVASC) 10 MG tablet Take 10 mg  by mouth daily.      . Azilsartan-Chlorthalidone 40-25 MG TABS Take 1 tablet by mouth daily.       [name] buPROPion (WELLBUTRIN SR) 150 MG 12 hr tablet Take 150 mg by mouth daily.       . ciprofloxacin (CIPRO) 500 MG tablet Take 1 tablet (500 mg total) by mouth 2 (two) times daily.  14 tablet  2  . dexamethasone (DECADRON) 4 MG tablet Take 2 tablets by mouth once a day on the day after chemotherapy and then take 2 tablets two times a day for 2 days. Take with food.  30 tablet  1  . hydrALAZINE (APRESOLINE) 25 MG tablet Take 25 mg by mouth 2 (two) times daily.      [name] HYDROcodone-acetaminophen (NORCO/VICODIN) 5-325 MG per tablet Take 1-2 tablets by mouth every 4 (four) hours as needed for pain.  30 tablet  0  . Lancet Devices (EASY TOUCH LANCING DEVICE) MISC       . lidocaine-prilocaine (EMLA) cream Apply topically as needed.  30 g  0  . LITE TOUCH LANCETS MISC       . LORazepam (ATIVAN) 0.5 MG tablet Take 1 tablet (0.$RemoveBef'5mg'dQPxvwgVtv$ ) every 6 hours as needed for nausea or vomiting.  30 tablet  0  . losartan (COZAAR) 100 MG tablet Take 100 mg by mouth daily.       . metoprolol succinate (TOPROL-XL) 25 MG 24 hr tablet Take 25 mg by mouth daily.      . ondansetron (ZOFRAN) 8 MG tablet Take 1 tablet ($RemoveB'8mg'DPKAKpTC$ s) every 12 hours as needed for nausea or vomiting starting the 3rd day after chemotherapy.  30 tablet  1  . ONE TOUCH ULTRA TEST test strip       . pantoprazole (PROTONIX) 40 MG tablet Take 40 mg by mouth daily.      . pioglitazone-metformin (ACTOPLUS MET) 15-850 MG per tablet Take 1 tablet by mouth 2 (two) times daily with a meal.      . rosuvastatin (CRESTOR) 5 MG tablet Take 5 mg by mouth daily.      . sitaGLIPtin (JANUVIA) 100 MG tablet Take 100 mg by mouth daily.      . prochlorperazine (COMPAZINE) 10 MG tablet Take 1 tablet (10 mg total) by mouth every 6 (six) hours as needed (Nausea or vomiting).  30 tablet  1   Current Facility-Administered Medications  Medication Dose Route Frequency Provider Last Rate Last  Dose  . LORazepam (ATIVAN)  tablet 0.5 mg  0.5 mg Oral Once Minette Headland, NP       Facility-Administered Medications Ordered in Other Visits  Medication Dose Route Frequency Provider Last Rate Last Dose  . sodium chloride 0.9 % injection 10 mL  10 mL Intravenous PRN [name], MD   10 mL at 05/24/13 1522      REVIEW OF SYSTEMS: A 10 point review of systems was completed and is negative except as noted above.   PHYSICAL EXAMINATION: BP 134/71  Pulse 80  Temp(Src) 98.2 F (36.8 C) (Oral)  Resp 18  Ht $R'5\' 2"'qG$  (1.575 m)  Wt 201 lb 5 oz (91.315 kg)  BMI 36.81 kg/m2  GENERAL: Patient is a well appearing female in no acute distress HEENT:  Sclerae anicteric.  Oropharynx clear and moist. No ulcerations or evidence of oropharyngeal candidiasis. Neck is supple.  NODES:  No cervical, supraclavicular, or axillary lymphadenopathy palpated.  BREAST EXAM:  Deferred. LUNGS:  Clear to auscultation bilaterally.  No wheezes or rhonchi. HEART:  Regular rate and rhythm. No murmur appreciated. ABDOMEN:  Soft, nontender.  Positive, normoactive bowel sounds. No organomegaly palpated. MSK:  No focal spinal tenderness to palpation. Full range of motion bilaterally in the upper extremities. EXTREMITIES:  No peripheral edema.   SKIN:  Clear with no obvious rashes or skin changes. Nails have started to become darkened at nail beds and are brittle NEURO:  Nonfocal. Well oriented.  Appropriate affect. ECOG FS:  0 - Asymptomatic   LAB RESULTS: Lab Results  Component Value Date   WBC 7.1 07/19/2013   NEUTROABS 4.6 07/19/2013   HGB 11.1* 07/19/2013   HCT 33.9* 07/19/2013   MCV 92.9 07/19/2013   PLT 247 07/19/2013      Chemistry      Component Value Date/Time   NA 139 07/19/2013 1351   NA 134* 04/24/2013 0550   K 4.1 07/19/2013 1351   K 3.8 04/24/2013 0550   CL 101 04/24/2013 0550   CO2 25 07/19/2013 1351   CO2 25 04/24/2013 0550   BUN 9.3 07/19/2013 1351   BUN 19 04/24/2013 0550   CREATININE 1.0 07/19/2013 1351    CREATININE 0.98 04/24/2013 0550      Component Value Date/Time   CALCIUM 9.6 07/19/2013 1351   CALCIUM 8.3* 04/24/2013 0550   ALKPHOS 99 07/19/2013 1351   AST 14 07/19/2013 1351   ALT 14 07/19/2013 1351   BILITOT 0.36 07/19/2013 1351       No results found for this basename: [id number]    No components found with this basename: [id number]     RADIOGRAPHIC STUDIES: 1.    2-D echocardiogram on 04/18/2013 showed a left ventricular ejection fraction of 55% - 60%    ASSESSMENT:  Kathy Maxwell is a  58 y.o. woman:  1.  Status post left breast modified radical mastectomy with left axillary lymph node resection and right breast lumpectomy with right axillary node biopsy on 04/22/2013 with pathology results as follows:  Left breast: Stage IIIA, pT3, pN2a, invasive ductal carcinoma with multiple foci, grade 1, largest span was 5.5 cm and ductal carcinoma in situ low-grade, 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% positive, progesterone receptor 23% positive, HER-2/neu no amplification, Ki-67 33%, with 4/22 metastatic lymph nodes and 1 isolated tumor cells identified within left axillary lymph node resection.  Extracapsular extension was present and surgical resection margins were negative for carcinoma.  Right breast: Stage 0, pTis, pN0, 3.0 cm ductal carcinoma in situ with calcifications, intermediate  to high-grade, 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% positive, progesterone receptor 11% positive, with 0/4 metastatic right axillary lymph nodes.  Reexcision of right breast medial margin: Ductal carcinoma in situ, intermediate grade spanning 0.8 cm, ductal carcinoma in situ is focally less than 0.1 cm from the new medial margin, lobular neoplasia (atypical lobular hyperplasia).  2..  Referred for genetics counseling/testing - appointment scheduled for 06/20/2013.  3.  Adjuvant chemotherapy consisting of FEC (5-FU/Epirubicin/Cytoxan)  that will be followed by adjuvant chemotherapy consisting of Taxol.  2-D echocardiogram on 04/18/2013  showed a left ventricular ejection fraction of 55% - 60%.   Started adjuvant chemotherapy with FEC on 05/24/2013.  #4 anemia due to chemotherapy  PLAN: #1  Patient is doing well today.  Her CBC is stable.  She will proceed with chemotherapy, day one of cycle 5 of FEC treatment.  I reviewed her chemotherapy plan with her again, as she is hoping to plan a trip to [place] between her FEC regimen and weekly Taxol regimen.    #2 She is nauseated and this started earlier today.  This is likely anticipatory nausea and I ordered PO lorazepam for her to receive in the infusion room.    #3 She will return tomorrow for Neulasta and in one week for labs and evaluation for chemotoxicities.   All questions were answered.  Ms. Altmann was encouraged to contact us in the interim with any problems, questions or concerns  I spent 25 minutes counseling the patient face to face.  The total time spent in the appointment was 30 minutes.  Minette Headland, [place] [phone] 07/19/2013, 2:54 PM

## 2013-07-19 NOTE — Telephone Encounter (Signed)
Per staff message and POF I have scheduled appts.  JMW  

## 2013-07-19 NOTE — Telephone Encounter (Signed)
, °

## 2013-07-19 NOTE — Patient Instructions (Signed)
[facility] Discharge Instructions for Patients Receiving Chemotherapy  Today you received the following chemotherapy agents Ellence, Adrucil and Cytoxan.  To help prevent nausea and vomiting after your treatment, we encourage you to take your nausea medication.   If you develop nausea and vomiting that is not controlled by your nausea medication, call the clinic.   BELOW ARE SYMPTOMS THAT SHOULD BE REPORTED IMMEDIATELY:  *FEVER GREATER THAN 100.5 F  *CHILLS WITH OR WITHOUT FEVER  NAUSEA AND VOMITING THAT IS NOT CONTROLLED WITH YOUR NAUSEA MEDICATION  *UNUSUAL SHORTNESS OF BREATH  *UNUSUAL BRUISING OR BLEEDING  TENDERNESS IN MOUTH AND THROAT WITH OR WITHOUT PRESENCE OF ULCERS  *URINARY PROBLEMS  *BOWEL PROBLEMS  UNUSUAL RASH Items with * indicate a potential emergency and should be followed up as soon as possible.  Feel free to call the clinic you have any questions or concerns. The clinic phone number is [phone].

## 2013-07-20 ENCOUNTER — Ambulatory Visit (HOSPITAL_BASED_OUTPATIENT_CLINIC_OR_DEPARTMENT_OTHER): Payer: Medicare HMO

## 2013-07-20 VITALS — BP 133/71 | HR 87 | Temp 96.7°F

## 2013-07-20 DIAGNOSIS — C50419 Malignant neoplasm of upper-outer quadrant of unspecified female breast: Secondary | ICD-10-CM

## 2013-07-20 DIAGNOSIS — D0590 Unspecified type of carcinoma in situ of unspecified breast: Secondary | ICD-10-CM

## 2013-07-20 DIAGNOSIS — C773 Secondary and unspecified malignant neoplasm of axilla and upper limb lymph nodes: Secondary | ICD-10-CM

## 2013-07-20 DIAGNOSIS — C50412 Malignant neoplasm of upper-outer quadrant of left female breast: Secondary | ICD-10-CM

## 2013-07-20 MED ORDER — PEGFILGRASTIM INJECTION 6 MG/0.6ML
6.0000 mg | Freq: Once | SUBCUTANEOUS | Status: AC
  Administered 2013-07-20: 6 mg via SUBCUTANEOUS

## 2013-07-20 NOTE — Patient Instructions (Signed)

## 2013-07-22 ENCOUNTER — Ambulatory Visit: Payer: Medicare HMO | Attending: General Surgery

## 2013-07-22 DIAGNOSIS — M24519 Contracture, unspecified shoulder: Secondary | ICD-10-CM | POA: Insufficient documentation

## 2013-07-22 DIAGNOSIS — IMO0001 Reserved for inherently not codable concepts without codable children: Secondary | ICD-10-CM | POA: Insufficient documentation

## 2013-07-22 DIAGNOSIS — I890 Lymphedema, not elsewhere classified: Secondary | ICD-10-CM | POA: Insufficient documentation

## 2013-07-24 ENCOUNTER — Ambulatory Visit: Payer: Medicare HMO

## 2013-07-25 ENCOUNTER — Other Ambulatory Visit: Payer: Self-pay | Admitting: *Deleted

## 2013-07-25 DIAGNOSIS — C50412 Malignant neoplasm of upper-outer quadrant of left female breast: Secondary | ICD-10-CM

## 2013-07-26 ENCOUNTER — Other Ambulatory Visit (HOSPITAL_BASED_OUTPATIENT_CLINIC_OR_DEPARTMENT_OTHER): Payer: Medicare HMO

## 2013-07-26 ENCOUNTER — Ambulatory Visit (HOSPITAL_BASED_OUTPATIENT_CLINIC_OR_DEPARTMENT_OTHER): Payer: Medicare HMO | Admitting: Adult Health

## 2013-07-26 ENCOUNTER — Telehealth: Payer: Self-pay | Admitting: Oncology

## 2013-07-26 ENCOUNTER — Encounter: Payer: Self-pay | Admitting: Adult Health

## 2013-07-26 VITALS — BP 151/72 | HR 97 | Temp 98.6°F | Resp 18 | Ht 62.0 in | Wt 207.1 lb

## 2013-07-26 DIAGNOSIS — T451X5A Adverse effect of antineoplastic and immunosuppressive drugs, initial encounter: Secondary | ICD-10-CM

## 2013-07-26 DIAGNOSIS — C50919 Malignant neoplasm of unspecified site of unspecified female breast: Secondary | ICD-10-CM

## 2013-07-26 DIAGNOSIS — C50412 Malignant neoplasm of upper-outer quadrant of left female breast: Secondary | ICD-10-CM

## 2013-07-26 DIAGNOSIS — C50419 Malignant neoplasm of upper-outer quadrant of unspecified female breast: Secondary | ICD-10-CM

## 2013-07-26 DIAGNOSIS — D6481 Anemia due to antineoplastic chemotherapy: Secondary | ICD-10-CM

## 2013-07-26 DIAGNOSIS — R197 Diarrhea, unspecified: Secondary | ICD-10-CM

## 2013-07-26 DIAGNOSIS — Z170 Estrogen receptor positive status [ER+]: Secondary | ICD-10-CM

## 2013-07-26 LAB — CBC WITH DIFFERENTIAL/PLATELET
BASO%: 0.4 % (ref 0.0–2.0)
BASOS ABS: 0 10*3/uL (ref 0.0–0.1)
EOS ABS: 0 10*3/uL (ref 0.0–0.5)
EOS%: 1.1 % (ref 0.0–7.0)
HEMATOCRIT: 29 % — AB (ref 34.8–46.6)
HEMOGLOBIN: 9.6 g/dL — AB (ref 11.6–15.9)
LYMPH%: 17.4 % (ref 14.0–49.7)
MCH: 30.7 pg (ref 25.1–34.0)
MCHC: 33.1 g/dL (ref 31.5–36.0)
MCV: 92.7 fL (ref 79.5–101.0)
MONO#: 0.1 10*3/uL (ref 0.1–0.9)
MONO%: 3.6 % (ref 0.0–14.0)
NEUT#: 2.1 10*3/uL (ref 1.5–6.5)
NEUT%: 77.5 % — ABNORMAL HIGH (ref 38.4–76.8)
PLATELETS: 243 10*3/uL (ref 145–400)
RBC: 3.12 10*6/uL — ABNORMAL LOW (ref 3.70–5.45)
RDW: 16 % — ABNORMAL HIGH (ref 11.2–14.5)
WBC: 2.7 10*3/uL — AB (ref 3.9–10.3)
lymph#: 0.5 10*3/uL — ABNORMAL LOW (ref 0.9–3.3)

## 2013-07-26 LAB — COMPREHENSIVE METABOLIC PANEL (CC13)
ALT: 9 U/L (ref 0–55)
AST: 8 U/L (ref 5–34)
Albumin: 3.5 g/dL (ref 3.5–5.0)
Alkaline Phosphatase: 124 U/L (ref 40–150)
Anion Gap: 10 mEq/L (ref 3–11)
BILIRUBIN TOTAL: 0.4 mg/dL (ref 0.20–1.20)
BUN: 12.7 mg/dL (ref 7.0–26.0)
CO2: 24 mEq/L (ref 22–29)
CREATININE: 0.8 mg/dL (ref 0.6–1.1)
Calcium: 9.4 mg/dL (ref 8.4–10.4)
Chloride: 105 mEq/L (ref 98–109)
GLUCOSE: 197 mg/dL — AB (ref 70–140)
Potassium: 3.6 mEq/L (ref 3.5–5.1)
SODIUM: 139 meq/L (ref 136–145)
TOTAL PROTEIN: 6.1 g/dL — AB (ref 6.4–8.3)

## 2013-07-26 NOTE — Patient Instructions (Signed)
Take Imodium if needed for diarrhea.  Do not take more than 16mg  in 24 hours.  If your bowels do not improve, please call us and we can prescribe something different.     Loperamide tablets or capsules What is this medicine? LOPERAMIDE (loe PER a mide) is used to treat diarrhea. This medicine may be used for other purposes; ask your health care provider or pharmacist if you have questions. COMMON BRAND NAME(S): Anti-Diarrheal, Imodium A-D, K-Pek II What should I tell my health care provider before I take this medicine? They need to know if you have any of these conditions: -a black or bloody stool -bacterial food poisoning -colitis or mucus in your stool -currently taking an antibiotic medication for an infection -fever -liver disease -severe abdominal pain, swelling or bulging -an unusual or allergic reaction to loperamide, other medicines, foods, dyes, or preservatives -pregnant or trying to get pregnant -breast-feeding How should I use this medicine? Take this medicine by mouth with a glass of water. Follow the directions on the prescription label. Take your doses at regular intervals. Do not take your medicine more often than directed. Talk to your pediatrician regarding the use of this medicine in children. Special care may be needed. Overdosage: If you think you have taken too much of this medicine contact a poison control center or emergency room at once. NOTE: This medicine is only for you. Do not share this medicine with others. What if I miss a dose? This does not apply. This medicine is not for regular use. Only take this medicine while you continue to have loose bowel movements. Do not take more medicine than recommended by the packaging label or by your healthcare professional. What may interact with this medicine? Do not take this medicine with any of the following medications: -alosetron This medicine may also interact with the following  medications: -quinidine -ritonavir -saquinavir This list may not describe all possible interactions. Give your health care provider a list of all the medicines, herbs, non-prescription drugs, or dietary supplements you use. Also tell them if you smoke, drink alcohol, or use illegal drugs. Some items may interact with your medicine. What should I watch for while using this medicine? Do not take this medicine for more than 1 week without asking your doctor or health care professional. If your symptoms do not start to get better after two days, you may have a problem that needs further evaluation. Check with your doctor or health care professional right away if you develop a fever, severe abdominal pain, swelling or bulging, or if you have have bloody/black diarrhea or stools. You may get drowsy or dizzy. Do not drive, use machinery, or do anything that needs mental alertness until you know how this medicine affects you. Do not stand or sit up quickly, especially if you are an older patient. This reduces the risk of dizzy or fainting spells. Alcohol can increase possible drowsiness and dizziness. Avoid alcoholic drinks. Your mouth may get dry. Chewing sugarless gum or sucking hard candy, and drinking plenty of water may help. Contact your doctor if the problem does not go away or is severe. Drinking plenty of water can also help prevent dehydration that can occur with diarrhea. Elderly patients may have a more variable response to the effects of this medicine, and are more susceptible to the effects of dehydration. What side effects may I notice from receiving this medicine? Side effects that you should report to your doctor or health care professional as  soon as possible: -allergic reactions like skin rash, itching or hives, swelling of the face, lips, or tongue -bloated, swollen feeling in your abdomen -blurred vision -loss of appetite -stomach pain Side effects that usually do not require medical  attention (report to your doctor or health care professional if they continue or are bothersome): -constipation -drowsiness or dizziness -dry mouth -nausea, vomiting This list may not describe all possible side effects. Call your doctor for medical advice about side effects. You may report side effects to FDA at 1-800-FDA-1088. Where should I keep my medicine? Keep out of the reach of children. Store at room temperature between 15 and 25 degrees C (59 and 77 degrees F). Keep container tightly closed. Throw away any unused medicine after the expiration date. NOTE: This sheet is a summary. It may not cover all possible information. If you have questions about this medicine, talk to your doctor, pharmacist, or health care provider.  2014, [employer]. (2008-01-08 16:02:13)

## 2013-07-26 NOTE — Progress Notes (Signed)
[place]  [phone]) [phone] [phone]) [phone]  OFFICE PROGRESS NOTE    PATIENT: Kathy Maxwell   DOB: 06-25-1956  MR#: [id number]  CVE#:[id number]   WC:HENIDPO,EUMPN N, MD SU:  Fanny Skates, MD   DIAGNOSIS: Kathy Maxwell is a 58 y.o. 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  Treated with adjuvant chemotherapy.   PRIOR THERAPY: 1.  Screening mammogram in 12/2012 was abnormal which led to left digital diagnostic mammogram and left breast ultrasound on 03/01/2013. Ultrasound of the left breast showed a hypoechoic mass at 12:30, 6 cm from the nipple, that measured 1.9 x 2.6 x 2.9 cm.  Ultrasound of the left axilla demonstrates a 1.1 x 0.7 x 0.9 cm lymph node with a borderline thickened cortex.  2.  Left breast needle core biopsy at the 12:30 o'clock position and left needle core biopsy of the left axillary lymph node on 03/07/2013 showed invasive mammary carcinoma with features compatible with a grade 1 or 2 ductal carcinoma in the breast needle core biopsy and lymph node was positive for metastatic mammary carcinoma with a macrometastasis and extracapsular extension identified.  Estrogen receptor 100% positive, progesterone receptor 75% positive, Ki-67 15%, HER-2/neu by CISH no amplification.  3.  Bilateral breast MRI on 03/15/2013 showed in the left breast there were at least 4 focus of abnormal spiculated enhancement in the upper left breast centrally.  The entire area measured 7.4 cm x 6.6 cm x 5.1 cm.  The previous biopsy focus was posteriorly located and measured 2.1 x 1 x 2.1 cm.  The anterior focus that was not biopsied measured 3.1 x 2.6 x 2.6 cm.  A lateral focus that was not biopsied measured 1.1 x 1.4 x 1.6 cm.  There were  small medial focus measuring cyst 0.7 x 0.8 x 0.7 cm  Post biopsy changes were identified in the left axilla.  There were abnormal enlarged lymph nodes correlating to the patient's known metastatic neoplasm to the lymph nodes which the largest lymph node measured 1.8 x 0.8 cm.  In the right breast there was an irregular focus of 3.3 x 3.1 x 1.3 cm abnormal enhancement in the central right breast in the middle one third.  There was a mild thickened cortex and prominent lymph nodes within the right axilla with the largest measuring 1.6 x 1 cm.  There was a posterior central upper breast focus with associated clip artifact measuring 1.2 x 2.2 x 1 cm previously biopsied to be benign.  Suspicious findings in bilateral breasts.  4.  Anterior left breast needle core biopsy and central right breast needle core biopsy on 04/03/2013.  Left breast needle core biopsy showed invasive ductal carcinoma and ductal carcinoma in situ, 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% positive, progesterone receptor 23% positive, Ki-67 33%, and HER-2/neu by CISH no amplification.  Right breast needle core biopsy showed ductal carcinoma in situ, 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% positive, progesterone receptor 11% positive.  5.  Status post left breast modified radical mastectomy with left axillary lymph node resection and right breast lumpectomy with right axillary node biopsy on 04/22/2013 with pathology results as follows:  Left breast: Stage IIIA, pT3, pN2a, invasive ductal carcinoma with multiple foci, grade 1, largest span was 5.5 cm and ductal carcinoma in situ low-grade, 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% positive,  progesterone receptor 23% positive, HER-2/neu no amplification, Ki-67 33%, with 4/22 metastatic lymph nodes and 1 isolated tumor cells identified within left axillary lymph node resection.  Extracapsular extension was present and surgical resection margins were negative for carcinoma.  Right breast: Stage 0, pTis, pN0, 3.0 cm ductal carcinoma in situ with  calcifications, intermediate to high-grade, 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% positive, progesterone receptor 11% positive, with 0/4 metastatic right axillary lymph nodes.  Reexcision of right breast medial margin: Ductal carcinoma in situ, intermediate grade spanning 0.8 cm, ductal carcinoma in situ is focally less than 0.1 cm from the new medial margin, lobular neoplasia (atypical lobular hyperplasia).  6.  Referred for genetics counseling and testing - appointment scheduled for 06/20/2013.  7.  Adjuvant chemotherapy consisting of FEC (5-FU/epirubicin/Cytoxan)  that will be followed by adjuvant chemotherapy consisting of Taxol.  2-D echocardiogram on 04/18/2013 showed a left ventricular ejection fraction of 55% - 60%.   Started adjuvant chemotherapy with FEC on 05/24/2013.  CURRENT THERAPY:  FEC cycle 5 day 8  INTERVAL HISTORY: Kathy Maxwell is here for followup visit after receiving cycle 5 of FEC. She is doing well today.  She continues  To have hyperpigmentation of the palms of her hands and darkining of her fingernails.  She has had diarrhea that she describes as loose stool every time she goes to the bathroom.  She has not taken imodium because she wants to let it run its course.  She denies fevers, nausea, vomiting, abdominal cramping, pus/blood in stool, dysuria, numbness, shortness of breath, DOE, cough, or any further concerns.    PAST MEDICAL HISTORY: Past Medical History  Diagnosis Date  . Hyperlipidemia   . Hypertension   . Bipolar 1 disorder   . Cancer   . Hypertension 04/04/2013  . Hyperlipidemia 04/04/2013  . Bipolar 1 disorder 04/04/2013  . Breast cancer 03/10/13    left breast invasive ca  . Allergy     latex  . Obesity   . Diabetes mellitus without complication     NIDDM  . Sleep apnea     no cpap due to insurance  . Heart murmur   . GERD (gastroesophageal reflux disease)   . Coronary artery disease     PAST SURGICAL HISTORY: Past Surgical History  Procedure Laterality  Date  . Cholecystectomy    . Breast biopsy Left 05/08/09    fibroadenoma with microcalcifications,no malignancy id  . Breast biopsy Left 03/07/13    invasive ca,1 lymph node metastatic  . 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 Abdominal hysterectomy    . Coronary stent placement  2001  . Cardiac catheterization    . Eye surgery  6/13,9/14    laser,cataract lft  . Coronary angioplasty      LAD stent 2003  . Portacath placement Right 04/22/2013    Procedure: INSERTION PORT-A-CATH;  Surgeon: Adin Hector, MD;  Location: [place];  Service: General;  Laterality: Right;  . Mastectomy modified radical Left 04/22/2013    Procedure: MASTECTOMY MODIFIED RADICAL;  Surgeon: Adin Hector, MD;  Location: [place];  Service: General;  Laterality: Left;  . Breast lumpectomy with needle localization and axillary sentinel lymph node bx Right 04/22/2013    Procedure: BREAST LUMPECTOMY WITH NEEDLE LOCALIZATION AND AXILLARY SENTINEL LYMPH NODE BX;  Surgeon: Adin Hector, MD;  Location: [place];  Service: General;  Laterality: Right;    FAMILY HISTORY: Family History  Problem Relation Age of Onset  .  Heart disease Mother   . Breast cancer Maternal Aunt     dx in her 24s  . Heart disease Cousin 69    maternal cousin  . Heart attack Sister 46  . Cancer Paternal Aunt     NOS  . Cancer Maternal Aunt     NOS  . Lung cancer Cousin 60    maternal cousin - non smoker  . Brain cancer Cousin 13    maternal cousin's son  . Lung cancer Cousin 73    maternal cousin's son  . Breast cancer Cousin     paternal cousin    SOCIAL HISTORY: History  Substance Use Topics  . Smoking status: Former Smoker -- 1.00 packs/day for 15 years    Types: Cigarettes    Quit date: 07/19/1987  . Smokeless tobacco: Never Used  . Alcohol Use: No    ALLERGIES: Allergies  Allergen Reactions  . Adhesive [Tape] Rash    Clear tape gloves ,elastic no problem      MEDICATIONS:  Current Outpatient Prescriptions  Medication Sig Dispense Refill  . amLODipine (NORVASC) 10 MG tablet Take 10 mg by mouth daily.      . Azilsartan-Chlorthalidone 40-25 MG TABS Take 1 tablet by mouth daily.       [name] buPROPion (WELLBUTRIN SR) 150 MG 12 hr tablet Take 150 mg by mouth daily.       . ciprofloxacin (CIPRO) 500 MG tablet Take 1 tablet (500 mg total) by mouth 2 (two) times daily.  14 tablet  2  . hydrALAZINE (APRESOLINE) 25 MG tablet Take 25 mg by mouth 2 (two) times daily.      [name] HYDROcodone-acetaminophen (NORCO/VICODIN) 5-325 MG per tablet Take 1-2 tablets by mouth every 4 (four) hours as needed for pain.  30 tablet  0  . Lancet Devices (EASY TOUCH LANCING DEVICE) MISC       . lidocaine-prilocaine (EMLA) cream Apply topically as needed.  30 g  0  . LITE TOUCH LANCETS MISC       . LORazepam (ATIVAN) 0.5 MG tablet Take 1 tablet (0.43m) every 6 hours as needed for nausea or vomiting.  30 tablet  0  . losartan (COZAAR) 100 MG tablet Take 100 mg by mouth daily.       . metoprolol succinate (TOPROL-XL) 25 MG 24 hr tablet Take 25 mg by mouth daily.      . ondansetron (ZOFRAN) 8 MG tablet Take 1 tablet (860m) every 12 hours as needed for nausea or vomiting starting the 3rd day after chemotherapy.  30 tablet  1  . ONE TOUCH ULTRA TEST test strip       . pantoprazole (PROTONIX) 40 MG tablet Take 40 mg by mouth daily.      . pioglitazone-metformin (ACTOPLUS MET) 15-850 MG per tablet Take 1 tablet by mouth 2 (two) times daily with a meal.      . rosuvastatin (CRESTOR) 5 MG tablet Take 5 mg by mouth daily.      . sitaGLIPtin (JANUVIA) 100 MG tablet Take 100 mg by mouth daily.      . Marland Kitchenexamethasone (DECADRON) 4 MG tablet Take 2 tablets by mouth once a day on the day after chemotherapy and then take 2 tablets two times a day for 2 days. Take with food.  30 tablet  1  . prochlorperazine (COMPAZINE) 10 MG tablet Take 1 tablet (10 mg total) by mouth every 6 (six) hours as needed  (Nausea or vomiting).  30 tablet  1   No current facility-administered medications for this visit.   Facility-Administered Medications Ordered in Other Visits  Medication Dose Route Frequency Provider Last Rate Last Dose  . sodium chloride 0.9 % injection 10 mL  10 mL Intravenous PRN [name], MD   10 mL at 05/24/13 1522      REVIEW OF SYSTEMS: A 10 point review of systems was completed and is negative except as noted above.   PHYSICAL EXAMINATION: BP 151/72  Pulse 97  Temp(Src) 98.6 F (37 C) (Oral)  Resp 18  Ht 5' 2" (1.575 m)  Wt 207 lb 1.6 oz (93.94 kg)  BMI 37.87 kg/m2  GENERAL: Patient is a well appearing female in no acute distress HEENT:  Sclerae anicteric.  Oropharynx clear and moist. No ulcerations or evidence of oropharyngeal candidiasis. Neck is supple.  NODES:  No cervical, supraclavicular, or axillary lymphadenopathy palpated.  BREAST EXAM:  Deferred. LUNGS:  Clear to auscultation bilaterally.  No wheezes or rhonchi. HEART:  Regular rate and rhythm. No murmur appreciated. ABDOMEN:  Soft, nontender.  Positive, normoactive bowel sounds. No organomegaly palpated. MSK:  No focal spinal tenderness to palpation. Full range of motion bilaterally in the upper extremities. EXTREMITIES:  No peripheral edema.   SKIN:  Clear with no obvious rashes or skin changes. Nails have started to become darkened at nail beds and are brittle, palms are hyperpigmented NEURO:  Nonfocal. Well oriented.  Appropriate affect. ECOG FS:  0 - Asymptomatic   LAB RESULTS: Lab Results  Component Value Date   WBC 2.7* 07/26/2013   NEUTROABS 2.1 07/26/2013   HGB 9.6* 07/26/2013   HCT 29.0* 07/26/2013   MCV 92.7 07/26/2013   PLT 243 07/26/2013      Chemistry      Component Value Date/Time   NA 139 07/26/2013 0934   NA 134* 04/24/2013 0550   K 3.6 07/26/2013 0934   K 3.8 04/24/2013 0550   CL 101 04/24/2013 0550   CO2 24 07/26/2013 0934   CO2 25 04/24/2013 0550   BUN 12.7 07/26/2013 0934   BUN 19  04/24/2013 0550   CREATININE 0.8 07/26/2013 0934   CREATININE 0.98 04/24/2013 0550      Component Value Date/Time   CALCIUM 9.4 07/26/2013 0934   CALCIUM 8.3* 04/24/2013 0550   ALKPHOS 124 07/26/2013 0934   AST 8 07/26/2013 0934   ALT 9 07/26/2013 0934   BILITOT 0.40 07/26/2013 0934       No results found for this basename: [id number]    No components found with this basename: [id number]     RADIOGRAPHIC STUDIES: 1.    2-D echocardiogram on 04/18/2013 showed a left ventricular ejection fraction of 55% - 60%    ASSESSMENT:  Kathy Maxwell is a  58 y.o. woman:  1.  Status post left breast modified radical mastectomy with left axillary lymph node resection and right breast lumpectomy with right axillary node biopsy on 04/22/2013 with pathology results as follows:  Left breast: Stage IIIA, pT3, pN2a, invasive ductal carcinoma with multiple foci, grade 1, largest span was 5.5 cm and ductal carcinoma in situ low-grade, 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% positive, progesterone receptor 23% positive, HER-2/neu no amplification, Ki-67 33%, with 4/22 metastatic lymph nodes and 1 isolated tumor cells identified within left axillary lymph node resection.  Extracapsular extension was present and surgical resection margins were negative for carcinoma.  Right breast: Stage 0, pTis, pN0, 3.0 cm ductal carcinoma in situ with calcifications, intermediate to high-grade, estrogen  receptor 100% positive, progesterone receptor 11% positive, with 0/4 metastatic right axillary lymph nodes.  Reexcision of right breast medial margin: Ductal carcinoma in situ, intermediate grade spanning 0.8 cm, ductal carcinoma in situ is focally less than 0.1 cm from the new medial margin, lobular neoplasia (atypical lobular hyperplasia).  2.  Referred for genetics counseling/testing. 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  Adjuvant chemotherapy consisting of FEC (5-FU/Epirubicin/Cytoxan)  that will be followed by  adjuvant chemotherapy consisting of Taxol.  2-D echocardiogram on 04/18/2013 showed a left ventricular ejection fraction of 55% - 60%.   Started adjuvant chemotherapy with FEC on 05/24/2013.  #4 anemia due to chemotherapy  PLAN: #1  Patient is doing well today.  Her CBC is stable.  She is not neutropenic following treatment.  She does not need antibiotics.  I reviewed her CBC with her in deatil.  Her CMP is pending.   #2 I encouraged her to take 57m of Imodium for her diarrhea.  She declined stating she was going to let it run its course.  I recommended she take the imodium and if her diarrhea worsened she should call the office and I will prescribe Questran.   #3 She will return in one week for her sixth and final cycle of FEC, labs and evaluation.    All questions were answered.  Kathy Maxwell encouraged to contact [facility]ain the interim with any problems, questions or concerns  I spent 25 minutes counseling the patient face to face.  The total time spent in the appointment was 30 minutes.  LMinette Headland [phone]/04/2014, 9:22 AM

## 2013-07-29 ENCOUNTER — Ambulatory Visit: Payer: Medicare HMO | Admitting: Physical Therapy

## 2013-07-31 ENCOUNTER — Ambulatory Visit: Payer: Medicare HMO | Admitting: Physical Therapy

## 2013-08-01 ENCOUNTER — Other Ambulatory Visit: Payer: Self-pay | Admitting: Emergency Medicine

## 2013-08-01 DIAGNOSIS — C50412 Malignant neoplasm of upper-outer quadrant of left female breast: Secondary | ICD-10-CM

## 2013-08-02 ENCOUNTER — Telehealth: Payer: Self-pay | Admitting: Oncology

## 2013-08-02 ENCOUNTER — Other Ambulatory Visit: Payer: Medicare Other

## 2013-08-02 ENCOUNTER — Ambulatory Visit (HOSPITAL_BASED_OUTPATIENT_CLINIC_OR_DEPARTMENT_OTHER): Payer: Medicare HMO

## 2013-08-02 ENCOUNTER — Other Ambulatory Visit (HOSPITAL_BASED_OUTPATIENT_CLINIC_OR_DEPARTMENT_OTHER): Payer: Medicare HMO

## 2013-08-02 ENCOUNTER — Telehealth: Payer: Self-pay | Admitting: *Deleted

## 2013-08-02 ENCOUNTER — Ambulatory Visit (HOSPITAL_BASED_OUTPATIENT_CLINIC_OR_DEPARTMENT_OTHER): Payer: Medicare HMO | Admitting: Adult Health

## 2013-08-02 ENCOUNTER — Encounter: Payer: Self-pay | Admitting: Adult Health

## 2013-08-02 VITALS — BP 147/70 | HR 85 | Temp 98.3°F | Resp 18 | Ht 62.0 in | Wt 206.3 lb

## 2013-08-02 DIAGNOSIS — C50412 Malignant neoplasm of upper-outer quadrant of left female breast: Secondary | ICD-10-CM

## 2013-08-02 DIAGNOSIS — I10 Essential (primary) hypertension: Secondary | ICD-10-CM

## 2013-08-02 DIAGNOSIS — C773 Secondary and unspecified malignant neoplasm of axilla and upper limb lymph nodes: Secondary | ICD-10-CM

## 2013-08-02 DIAGNOSIS — C50919 Malignant neoplasm of unspecified site of unspecified female breast: Secondary | ICD-10-CM

## 2013-08-02 DIAGNOSIS — C50119 Malignant neoplasm of central portion of unspecified female breast: Secondary | ICD-10-CM

## 2013-08-02 DIAGNOSIS — D6481 Anemia due to antineoplastic chemotherapy: Secondary | ICD-10-CM

## 2013-08-02 DIAGNOSIS — T451X5A Adverse effect of antineoplastic and immunosuppressive drugs, initial encounter: Secondary | ICD-10-CM

## 2013-08-02 DIAGNOSIS — E119 Type 2 diabetes mellitus without complications: Secondary | ICD-10-CM

## 2013-08-02 DIAGNOSIS — Z5111 Encounter for antineoplastic chemotherapy: Secondary | ICD-10-CM

## 2013-08-02 LAB — CBC WITH DIFFERENTIAL/PLATELET
BASO%: 0.5 % (ref 0.0–2.0)
Basophils Absolute: 0 10*3/uL (ref 0.0–0.1)
EOS ABS: 0.1 10*3/uL (ref 0.0–0.5)
EOS%: 0.6 % (ref 0.0–7.0)
HCT: 29.2 % — ABNORMAL LOW (ref 34.8–46.6)
HGB: 9.5 g/dL — ABNORMAL LOW (ref 11.6–15.9)
LYMPH%: 15.3 % (ref 14.0–49.7)
MCH: 29.9 pg (ref 25.1–34.0)
MCHC: 32.5 g/dL (ref 31.5–36.0)
MCV: 91.8 fL (ref 79.5–101.0)
MONO#: 0.9 10*3/uL (ref 0.1–0.9)
MONO%: 9.9 % (ref 0.0–14.0)
NEUT#: 6.5 10*3/uL (ref 1.5–6.5)
NEUT%: 73.7 % (ref 38.4–76.8)
NRBC: 1 % — AB (ref 0–0)
Platelets: 176 10*3/uL (ref 145–400)
RBC: 3.18 10*6/uL — AB (ref 3.70–5.45)
RDW: 16.5 % — AB (ref 11.2–14.5)
WBC: 8.8 10*3/uL (ref 3.9–10.3)
lymph#: 1.3 10*3/uL (ref 0.9–3.3)

## 2013-08-02 LAB — COMPREHENSIVE METABOLIC PANEL (CC13)
ALBUMIN: 3.5 g/dL (ref 3.5–5.0)
ALK PHOS: 101 U/L (ref 40–150)
ALT: 8 U/L (ref 0–55)
AST: 10 U/L (ref 5–34)
Anion Gap: 9 mEq/L (ref 3–11)
BILIRUBIN TOTAL: 0.3 mg/dL (ref 0.20–1.20)
BUN: 7.4 mg/dL (ref 7.0–26.0)
CO2: 28 meq/L (ref 22–29)
Calcium: 9 mg/dL (ref 8.4–10.4)
Chloride: 104 mEq/L (ref 98–109)
Creatinine: 0.8 mg/dL (ref 0.6–1.1)
GLUCOSE: 153 mg/dL — AB (ref 70–140)
POTASSIUM: 3.2 meq/L — AB (ref 3.5–5.1)
SODIUM: 142 meq/L (ref 136–145)
TOTAL PROTEIN: 6.1 g/dL — AB (ref 6.4–8.3)

## 2013-08-02 MED ORDER — FLUOROURACIL CHEMO INJECTION 2.5 GM/50ML
500.0000 mg/m2 | Freq: Once | INTRAVENOUS | Status: AC
  Administered 2013-08-02: 1000 mg via INTRAVENOUS
  Filled 2013-08-02: qty 20

## 2013-08-02 MED ORDER — HEPARIN SOD (PORK) LOCK FLUSH 100 UNIT/ML IV SOLN
500.0000 [IU] | Freq: Once | INTRAVENOUS | Status: AC | PRN
  Administered 2013-08-02: 500 [IU]
  Filled 2013-08-02: qty 5

## 2013-08-02 MED ORDER — DEXAMETHASONE SODIUM PHOSPHATE 20 MG/5ML IJ SOLN
12.0000 mg | Freq: Once | INTRAMUSCULAR | Status: AC
  Administered 2013-08-02: 12 mg via INTRAVENOUS

## 2013-08-02 MED ORDER — POTASSIUM CHLORIDE CRYS ER 20 MEQ PO TBCR
40.0000 meq | EXTENDED_RELEASE_TABLET | Freq: Once | ORAL | Status: AC
  Administered 2013-08-02: 40 meq via ORAL
  Filled 2013-08-02: qty 2

## 2013-08-02 MED ORDER — PALONOSETRON HCL INJECTION 0.25 MG/5ML
0.2500 mg | Freq: Once | INTRAVENOUS | Status: AC
  Administered 2013-08-02: 0.25 mg via INTRAVENOUS

## 2013-08-02 MED ORDER — EPIRUBICIN HCL CHEMO IV INJECTION 200 MG/100ML
100.0000 mg/m2 | Freq: Once | INTRAVENOUS | Status: AC
  Administered 2013-08-02: 200 mg via INTRAVENOUS
  Filled 2013-08-02: qty 100

## 2013-08-02 MED ORDER — SODIUM CHLORIDE 0.9 % IJ SOLN
10.0000 mL | INTRAMUSCULAR | Status: DC | PRN
  Administered 2013-08-02: 10 mL
  Filled 2013-08-02: qty 10

## 2013-08-02 MED ORDER — PALONOSETRON HCL INJECTION 0.25 MG/5ML
INTRAVENOUS | Status: AC
  Filled 2013-08-02: qty 5

## 2013-08-02 MED ORDER — DEXAMETHASONE SODIUM PHOSPHATE 20 MG/5ML IJ SOLN
INTRAMUSCULAR | Status: AC
  Filled 2013-08-02: qty 5

## 2013-08-02 MED ORDER — SODIUM CHLORIDE 0.9 % IV SOLN
Freq: Once | INTRAVENOUS | Status: AC
  Administered 2013-08-02: 14:00:00 via INTRAVENOUS

## 2013-08-02 MED ORDER — SODIUM CHLORIDE 0.9 % IV SOLN
500.0000 mg/m2 | Freq: Once | INTRAVENOUS | Status: AC
  Administered 2013-08-02: 1000 mg via INTRAVENOUS
  Filled 2013-08-02: qty 50

## 2013-08-02 MED ORDER — SODIUM CHLORIDE 0.9 % IV SOLN
150.0000 mg | Freq: Once | INTRAVENOUS | Status: AC
  Administered 2013-08-02: 150 mg via INTRAVENOUS
  Filled 2013-08-02: qty 5

## 2013-08-02 NOTE — Telephone Encounter (Signed)
Per staff message and POF I have scheduled appts.  JMW  

## 2013-08-02 NOTE — Patient Instructions (Signed)
[facility] Discharge Instructions for Patients Receiving Chemotherapy  Today you received the following chemotherapy agents Ellence, Cytoxan, Adrucil.  To help prevent nausea and vomiting after your treatment, we encourage you to take your nausea medication as needed.    If you develop nausea and vomiting that is not controlled by your nausea medication, call the clinic.   BELOW ARE SYMPTOMS THAT SHOULD BE REPORTED IMMEDIATELY:  *FEVER GREATER THAN 100.5 F  *CHILLS WITH OR WITHOUT FEVER  NAUSEA AND VOMITING THAT IS NOT CONTROLLED WITH YOUR NAUSEA MEDICATION  *UNUSUAL SHORTNESS OF BREATH  *UNUSUAL BRUISING OR BLEEDING  TENDERNESS IN MOUTH AND THROAT WITH OR WITHOUT PRESENCE OF ULCERS  *URINARY PROBLEMS  *BOWEL PROBLEMS  UNUSUAL RASH Items with * indicate a potential emergency and should be followed up as soon as possible.  Feel free to call the clinic you have any questions or concerns. The clinic phone number is [phone].    Potassium Content of Foods Potassium is a mineral found in many foods and drinks. It helps keep fluids and minerals balanced in your body and also affects how steadily your heart beats. The body needs potassium to control blood pressure and to keep the muscles and nervous system healthy. However, certain health conditions and medicine may require you to eat more or less potassium-rich foods and drinks. Your caregiver or dietitian will tell you how much potassium you should have each day. COMMON SERVING SIZES The list below tells you how big or small common portion sizes are:  1 oz.........4 stacked dice.  3 oz........Marland KitchenDeck of cards.  1 tsp.......Marland KitchenTip of little finger.  1 tbsp....Marland KitchenMarland KitchenThumb.  2 tbsp....Marland KitchenMarland KitchenGolf ball.   c..........Marland KitchenHalf of a fist.  1 c...........Marland KitchenA fist. FOODS AND DRINKS HIGH IN POTASSIUM More than 200 mg of potassium per serving. A serving size is  c (120 mL or noted gram weight) unless otherwise stated. While  all the items on this list are high in potassium, some items are higher in potassium than others. Fruits  Apricots (sliced), 83 g.  Apricots (dried halves), 3 oz / 24 g.  Avocado (cubed),  c / 50 g.  Banana (sliced), 75 g.  Cantaloupe (cubed), 80 g.  Dates (pitted), 5 whole / 35 g.  Figs (dried), 4 whole / 32 g.  Guava, c / 55 g.  Honeydew, 1 wedge / 85 g.  Kiwi (sliced), 90 g.  Nectarine, 1 small / 129 g.  Orange, 1 medium / 131 g.  Orange juice.  Pomegranate seeds, 87 g.  Pomegranate juice.  Prunes (pitted), 3 whole / 30 g.  Prune juice, 3 oz / 90 mL.  Seedless raisins, 3 tbsp / 27 g. Vegetables  Artichoke,  of a medium / 64 g.  Asparagus (boiled), 90 g.  Baked beans,  c / 63 g.  Bamboo shoots,  c / 38 g.  Beets (cooked slices), 85 g.  Broccoli (boiled), 78 g.  Brussels sprout (boiled), 78 g.  Butternut squash (baked), 103 g.  Chickpea (cooked), 82 g.  Green peas (cooked), 80 g.  Hubbard squash (baked cubes),  c / 68 g.  Kidney beans (cooked), 5 tbsp / 55 g.  Lima beans (cooked),  c / 43 g.  Navy beans (cooked),  c / 61 g.  Potato (baked), 61 g.  Potato (boiled), 78 g.  Pumpkin (boiled), 123 g.  Refried beans,  c / 79 g.  Spinach (cooked),  c / 45 g.  Split peas (cooked),  c /  65 g.  Sun-dried tomatoes, 2 tbsp / 7 g.  Sweet potato (baked),  c / 50 g.  Tomato (chopped or sliced), 90 g.  Tomato juice.  Tomato paste, 4 tsp / 21 g.  Tomato sauce,  c / 61 g.  Vegetable juice.  White mushrooms (cooked), 78 g.  Yam (cooked or baked),  c / 34 g.  Zucchini squash (boiled), 90 g. Other Foods and Drinks  Almonds (whole),  c / 36 g.  Cashews (oil roasted),  c / 32 g.  Chocolate milk.  Chocolate pudding, 142 g.  Clams (steamed), 1.5 oz / 43 g.  Dark chocolate, 1.5 oz / 42 g.  Fish, 3 oz / 85 g.  King crab (steamed), 3 oz / 85 g.  Lobster (steamed), 4 oz / 113 g.  Milk (skim, 1%, 2%, whole), 1 c / 240  mL.  Milk chocolate, 2.3 oz / 66 g.  Milk shake.  Nonfat fruit variety yogurt, 123 g.  Peanuts (oil roasted), 1 oz / 28 g.  Peanut butter, 2 tbsp / 32 g.  Pistachio nuts, 1 oz / 28 g.  Pumpkin seeds, 1 oz / 28 g.  Red meat (broiled, cooked, grilled), 3 oz / 85 g.  Scallops (steamed), 3 oz / 85 g.  Shredded wheat cereal (dry), 3 oblong biscuits / 75 g.  Spaghetti sauce,  c / 66 g.  Sunflower seeds (dry roasted), 1 oz / 28 g.  Veggie burger, 1 patty / 70 g. FOODS MODERATE IN POTASSIUM Between 150 mg and 200 mg per serving. A serving is  c (120 mL or noted gram weight) unless otherwise stated. Fruits  Grapefruit,  of the fruit / 123 g.  Grapefruit juice.  Pineapple juice.  Plums (sliced), 83 g.  Tangerine, 1 large / 120 g. Vegetables  Carrots (boiled), 78 g.  Carrots (sliced), 61 g.  Rhubarb (cooked with sugar), 120 g.  Rutabaga (cooked), 120 g.  Sweet corn (cooked), 75 g.  Yellow snap beans (cooked), 63 g. Other Foods and Drinks   Bagel, 1 bagel / 98 g.  Chicken breast (roasted and chopped),  c / 70 g.  Chocolate ice cream / 66 g.  Pita bread, 1 large / 64 g.  Shrimp (steamed), 4 oz / 113 g.  Swiss cheese (diced), 70 g.  Vanilla ice cream, 66 g.  Vanilla pudding, 140 g. FOODS LOW IN POTASSIUM Less than 150 mg per serving. A serving size is  cup (120 mL or noted gram weight) unless otherwise stated. If you eat more than 1 serving of a food low in potassium, the food may be considered a food high in potassium. Fruits  Apple (slices), 55 g.  Apple juice.  Applesauce, 122 g.  Blackberries, 72 g.  Blueberries, 74 g.  Cranberries, 50 g.  Cranberry juice.  Fruit cocktail, 119 g.  Fruit punch.  Grapes, 46 g.  Grape juice.  Mandarin oranges (canned), 126 g.  Peach (slices), 77 g.  Pineapple (chunks), 83 g.  Raspberries, 62 g.  Red cherries (without pits), 78 g.  Strawberries (sliced), 83 g.  Watermelon (diced), 76  g. Vegetables  Alfalfa sprouts, 17 g.  Bell peppers (sliced), 46 g.  Cabbage (shredded), 35 g.  Cauliflower (boiled), 62 g.  Celery, 51 g.  Collard greens (boiled), 95 g.  Cucumber (sliced), 52 g.  Eggplant (cubed), 41 g.  Green beans (boiled), 63 g.  Lettuce (shredded), 1 c / 36 g.  Onions (sauteed), 44  g.  Radishes (sliced), 58 g.  Spaghetti squash, 51 g. Other Foods and Drinks  [employer], 1 slice / 28 g.  Black tea.  Brown rice (cooked), 98 g.  Butter croissant, 1 medium / 57 g.  Carbonated soda.  Coffee.  Cheddar cheese (diced), 66 g.  Corn flake cereal (dry), 14 g.  Cottage cheese, 118 g.  Cream of rice cereal (cooked), 122 g.  Cream of wheat cereal (cooked), 126 g.  Crisped rice cereal (dry), 14 g.  Egg (boiled, fried, poached, omelet, scrambled), 1 large / 46 61 g.  English muffin, 1 muffin / 57 g.  Frozen ice pop, 1 pop / 55 g.  Graham cracker, 1 large rectangular cracker / 14 g.  Jelly beans, 112 g.  Non-dairy whipped topping.  Oatmeal, 88 g.  Orange sherbet, 74 g.  Puffed rice cereal (dry), 7 g.  Pasta (cooked), 70 g.  Rice cakes, 4 cakes / 36 g.  Sugared doughnut, 4 oz / 116 g.  White bread, 1 slice / 30 g.  White rice (cooked), 79 93 g.  Wild rice (cooked), 82 g.  Yellow cake, 1 slice / 68 g. Document Released: 02/15/2005 Document Revised: 06/20/2012 Document Reviewed: 11/18/2011 [facility] Patient Information 2014 [place].  HAVE A GREAT WEEKEND!!!

## 2013-08-02 NOTE — Progress Notes (Signed)
[place]  [phone] Fax:(336) [phone]  OFFICE PROGRESS NOTE    PATIENT: Kathy Maxwell   DOB: 29-Apr-1956  MR#: [id number]  GUR#:[id number]   EG:[id number] N, MD SU:  Fanny Skates, MD   DIAGNOSIS: Kathy Maxwell is a 58 y.o. 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  Treated with adjuvant chemotherapy.   PRIOR THERAPY: 1.  Screening mammogram in 12/2012 was abnormal which led to left digital diagnostic mammogram and left breast ultrasound on 03/01/2013. Ultrasound of the left breast showed a hypoechoic mass at 12:30, 6 cm from the nipple, that measured 1.9 x 2.6 x 2.9 cm.  Ultrasound of the left axilla demonstrates a 1.1 x 0.7 x 0.9 cm lymph node with a borderline thickened cortex.  2.  Left breast needle core biopsy at the 12:30 o'clock position and left needle core biopsy of the left axillary lymph node on 03/07/2013 showed invasive mammary carcinoma with features compatible with a grade 1 or 2 ductal carcinoma in the breast needle core biopsy and lymph node was positive for metastatic mammary carcinoma with a macrometastasis and extracapsular extension identified.  Estrogen receptor 100% positive, progesterone receptor 75% positive, Ki-67 15%, HER-2/neu by CISH no amplification.  3.  Bilateral breast MRI on 03/15/2013 showed in the left breast there were at least 4 focus of abnormal spiculated enhancement in the upper left breast centrally.  The entire area measured 7.4 cm x 6.6 cm x 5.1 cm.  The previous biopsy focus was posteriorly located and measured 2.1 x 1 x 2.1 cm.  The anterior focus that was not biopsied measured 3.1 x 2.6 x 2.6 cm.  A lateral focus that was not biopsied measured 1.1 x 1.4 x 1.6 cm.  There were  small medial focus measuring cyst 0.7 x 0.8 x 0.7 cm  Post biopsy changes were identified in the left axilla.  There were abnormal enlarged lymph nodes correlating to the patient's known metastatic neoplasm to the lymph nodes which the largest lymph node measured 1.8 x 0.8 cm.  In the right breast there was an irregular focus of 3.3 x 3.1 x 1.3 cm abnormal enhancement in the central right breast in the middle one third.  There was a mild thickened cortex and prominent lymph nodes within the right axilla with the largest measuring 1.6 x 1 cm.  There was a posterior central upper breast focus with associated clip artifact measuring 1.2 x 2.2 x 1 cm previously biopsied to be benign.  Suspicious findings in bilateral breasts.  4.  Anterior left breast needle core biopsy and central right breast needle core biopsy on 04/03/2013.  Left breast needle core biopsy showed invasive ductal carcinoma and ductal carcinoma in situ, 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% positive, progesterone receptor 23% positive, Ki-67 33%, and HER-2/neu by CISH no amplification.  Right breast needle core biopsy showed ductal carcinoma in situ, 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% positive, progesterone receptor 11% positive.  5.  Status post left breast modified radical mastectomy with left axillary lymph node resection and right breast lumpectomy with right axillary node biopsy on 04/22/2013 with pathology results as follows:  Left breast: Stage IIIA, pT3, pN2a, invasive ductal carcinoma with multiple foci, grade 1, largest span was 5.5 cm and ductal carcinoma in situ low-grade, 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% positive,  progesterone receptor 23% positive, HER-2/neu no amplification, Ki-67 33%, with 4/22 metastatic lymph nodes and 1 isolated tumor cells identified within left axillary lymph node resection.  Extracapsular extension was present and surgical resection margins were negative for carcinoma.  Right breast: Stage 0, pTis, pN0, 3.0 cm ductal carcinoma in situ with  calcifications, intermediate to high-grade, 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% positive, progesterone receptor 11% positive, with 0/4 metastatic right axillary lymph nodes.  Reexcision of right breast medial margin: Ductal carcinoma in situ, intermediate grade spanning 0.8 cm, ductal carcinoma in situ is focally less than 0.1 cm from the new medial margin, lobular neoplasia (atypical lobular hyperplasia).  6.  Referred for genetics counseling and testing - appointment scheduled for 06/20/2013.  7.  Adjuvant chemotherapy consisting of FEC (5-FU/epirubicin/Cytoxan)  that will be followed by adjuvant chemotherapy consisting of Taxol.  2-D echocardiogram on 04/18/2013 showed a left ventricular ejection fraction of 55% - 60%.   Started adjuvant chemotherapy with FEC on 05/24/2013.  CURRENT THERAPY:  FEC cycle 6 day 1  INTERVAL HISTORY: [name] is here for followup prior to receiving cycle 6 of FEC.  She is doing well today.  She denies fevers, chills, nausea, vomiting, constipation, diarrhea, numbness, mucositis.  She has mild darkening of her nails and tenderness on the tops of her nails.  Otherwise, she is well and a 10 point ROS is negative.   PAST MEDICAL HISTORY: Past Medical History  Diagnosis Date  . Hyperlipidemia   . Hypertension   . Bipolar 1 disorder   . Cancer   . Hypertension 04/04/2013  . Hyperlipidemia 04/04/2013  . Bipolar 1 disorder 04/04/2013  . Breast cancer 03/10/13    left breast invasive ca  . Allergy     latex  . Obesity   . Diabetes mellitus without complication     NIDDM  . Sleep apnea     no cpap due to insurance  . Heart murmur   . GERD (gastroesophageal reflux disease)   . Coronary artery disease     PAST SURGICAL HISTORY: Past Surgical History  Procedure Laterality Date  . Cholecystectomy    . Breast biopsy Left 05/08/09    fibroadenoma with microcalcifications,no malignancy id  . Breast biopsy Left 03/07/13    invasive ca,1 lymph node metastatic  . Breast  biopsy Bilateral 03/07/13    left-invasive ductal ca,DCIS, Right=DCIS,ER/PR=+her2=  . Abdominal hysterectomy    . Coronary stent placement  2001  . Cardiac catheterization    . Eye surgery  6/13,9/14    laser,cataract lft  . Coronary angioplasty      LAD stent 2003  . Portacath placement Right 04/22/2013    Procedure: INSERTION PORT-A-CATH;  Surgeon: Adin Hector, MD;  Location: [facility];  Service: General;  Laterality: Right;  . Mastectomy modified radical Left 04/22/2013    Procedure: MASTECTOMY MODIFIED RADICAL;  Surgeon: Adin Hector, MD;  Location: [facility];  Service: General;  Laterality: Left;  . Breast lumpectomy with needle localization and axillary sentinel lymph node bx Right 04/22/2013    Procedure: BREAST LUMPECTOMY WITH NEEDLE LOCALIZATION AND AXILLARY SENTINEL LYMPH NODE BX;  Surgeon: Adin Hector, MD;  Location: [facility];  Service: General;  Laterality: Right;    FAMILY HISTORY: Family History  Problem Relation Age of Onset  . Heart disease Mother   . Breast cancer Maternal Aunt     dx in her 68s  . Heart disease Cousin 36    maternal cousin  . Heart  attack Sister 51  . Cancer Paternal Aunt     NOS  . Cancer Maternal Aunt     NOS  . Lung cancer Cousin 20    maternal cousin - non smoker  . Brain cancer Cousin 13    maternal cousin's son  . Lung cancer Cousin 32    maternal cousin's son  . Breast cancer Cousin     paternal cousin    SOCIAL HISTORY: History  Substance Use Topics  . Smoking status: Former Smoker -- 1.00 packs/day for 15 years    Types: Cigarettes    Quit date: 07/19/1987  . Smokeless tobacco: Never Used  . Alcohol Use: No    ALLERGIES: Allergies  Allergen Reactions  . Adhesive [Tape] Rash    Clear tape gloves ,elastic no problem     MEDICATIONS:  Current Outpatient Prescriptions  Medication Sig Dispense Refill  . amLODipine (NORVASC) 10 MG tablet Take 10 mg by mouth daily.      [name] buPROPion (WELLBUTRIN SR) 150 MG 12 hr tablet  Take 150 mg by mouth daily.       . ciprofloxacin (CIPRO) 500 MG tablet Take 1 tablet (500 mg total) by mouth 2 (two) times daily.  14 tablet  2  . hydrALAZINE (APRESOLINE) 25 MG tablet Take 25 mg by mouth 2 (two) times daily.      [name] HYDROcodone-acetaminophen (NORCO/VICODIN) 5-325 MG per tablet Take 1-2 tablets by mouth every 4 (four) hours as needed for pain.  30 tablet  0  . Lancet Devices (EASY TOUCH LANCING DEVICE) MISC       . lidocaine-prilocaine (EMLA) cream Apply topically as needed.  30 g  0  . LITE TOUCH LANCETS MISC       . LORazepam (ATIVAN) 0.5 MG tablet Take 1 tablet (0.28m) every 6 hours as needed for nausea or vomiting.  30 tablet  0  . losartan (COZAAR) 100 MG tablet Take 100 mg by mouth daily.       . metoprolol succinate (TOPROL-XL) 25 MG 24 hr tablet Take 25 mg by mouth daily.      . ONE TOUCH ULTRA TEST test strip       . pantoprazole (PROTONIX) 40 MG tablet Take 40 mg by mouth daily.      . pioglitazone-metformin (ACTOPLUS MET) 15-850 MG per tablet Take 1 tablet by mouth 2 (two) times daily with a meal.      . rosuvastatin (CRESTOR) 5 MG tablet Take 5 mg by mouth daily.      . sitaGLIPtin (JANUVIA) 100 MG tablet Take 100 mg by mouth daily.      . Azilsartan-Chlorthalidone 40-25 MG TABS Take 1 tablet by mouth daily.       .Marland Kitchendexamethasone (DECADRON) 4 MG tablet Take 2 tablets by mouth once a day on the day after chemotherapy and then take 2 tablets two times a day for 2 days. Take with food.  30 tablet  1  . ondansetron (ZOFRAN) 8 MG tablet Take 1 tablet (875m) every 12 hours as needed for nausea or vomiting starting the 3rd day after chemotherapy.  30 tablet  1  . prochlorperazine (COMPAZINE) 10 MG tablet Take 1 tablet (10 mg total) by mouth every 6 (six) hours as needed (Nausea or vomiting).  30 tablet  1   No current facility-administered medications for this visit.   Facility-Administered Medications Ordered in Other Visits  Medication Dose Route Frequency Provider Last  Rate Last Dose  . sodium  chloride 0.9 % injection 10 mL  10 mL Intravenous PRN [name], MD   10 mL at 05/24/13 1522      REVIEW OF SYSTEMS: A 10 point review of systems was completed and is negative except as noted above.   PHYSICAL EXAMINATION: BP 147/70  Pulse 85  Temp(Src) 98.3 F (36.8 C) (Oral)  Resp 18  Ht _0  (1.575 m)  Wt 206 lb 4.8 oz (93.577 kg)  BMI 37.72 kg/m2  GENERAL: Patient is a well appearing female in no acute distress HEENT:  Sclerae anicteric.  Oropharynx clear and moist. No ulcerations or evidence of oropharyngeal candidiasis. Neck is supple.  NODES:  No cervical, supraclavicular, or axillary lymphadenopathy palpated.  BREAST EXAM:  Deferred. LUNGS:  Clear to auscultation bilaterally.  No wheezes or rhonchi. HEART:  Regular rate and rhythm. No murmur appreciated. ABDOMEN:  Soft, nontender.  Positive, normoactive bowel sounds. No organomegaly palpated. MSK:  No focal spinal tenderness to palpation. Full range of motion bilaterally in the upper extremities. EXTREMITIES:  No peripheral edema.   SKIN:  Clear with no obvious rashes or skin changes. Nails have started to become darkened at nail beds and are brittle, palms are hyperpigmented NEURO:  Nonfocal. Well oriented.  Appropriate affect. ECOG FS:  0 - Asymptomatic   LAB RESULTS: Lab Results  Component Value Date   WBC 8.8 08/02/2013   NEUTROABS 6.5 08/02/2013   HGB 9.5* 08/02/2013   HCT 29.2* 08/02/2013   MCV 91.8 08/02/2013   PLT 176 08/02/2013      Chemistry      Component Value Date/Time   NA 142 08/02/2013 1200   NA 134* 04/24/2013 0550   K 3.2* 08/02/2013 1200   K 3.8 04/24/2013 0550   CL 101 04/24/2013 0550   CO2 28 08/02/2013 1200   CO2 25 04/24/2013 0550   BUN 7.4 08/02/2013 1200   BUN 19 04/24/2013 0550   CREATININE 0.8 08/02/2013 1200   CREATININE 0.98 04/24/2013 0550      Component Value Date/Time   CALCIUM 9.0 08/02/2013 1200   CALCIUM 8.3* 04/24/2013 0550   ALKPHOS 101 08/02/2013 1200    AST 10 08/02/2013 1200   ALT 8 08/02/2013 1200   BILITOT 0.30 08/02/2013 1200       No results found for this basename: [id number]    No components found with this basename: [id number]     RADIOGRAPHIC STUDIES: 1.    2-D echocardiogram on 04/18/2013 showed a left ventricular ejection fraction of 55% - 60%    ASSESSMENT:  Kathy Maxwell is a  58 y.o. woman:  1.  Status post left breast modified radical mastectomy with left axillary lymph node resection and right breast lumpectomy with right axillary node biopsy on 04/22/2013 with pathology results as follows:  Left breast: Stage IIIA, pT3, pN2a, invasive ductal carcinoma with multiple foci, grade 1, largest span was 5.5 cm and ductal carcinoma in situ low-grade, 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% positive, progesterone receptor 23% positive, HER-2/neu no amplification, Ki-67 33%, with 4/22 metastatic lymph nodes and 1 isolated tumor cells identified within left axillary lymph node resection.  Extracapsular extension was present and surgical resection margins were negative for carcinoma.  Right breast: Stage 0, pTis, pN0, 3.0 cm ductal carcinoma in situ with calcifications, intermediate to high-grade, 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% positive, progesterone receptor 11% positive, with 0/4 metastatic right axillary lymph nodes.  Reexcision of right breast medial margin: Ductal carcinoma in situ, intermediate grade spanning 0.8 cm, ductal  carcinoma in situ is focally less than 0.1 cm from the new medial margin, lobular neoplasia (atypical lobular hyperplasia).  2.  Referred for genetics counseling/testing. 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  Adjuvant chemotherapy consisting of FEC (5-FU/Epirubicin/Cytoxan)  that will be followed by adjuvant chemotherapy consisting of Taxol.  2-D echocardiogram on 04/18/2013 showed a left ventricular ejection fraction of 55% - 60%.   Started adjuvant chemotherapy with FEC on 05/24/2013.  #4  anemia due to chemotherapy  PLAN: #1 Patient is doing well today.  Her CBC is stable, I reviewed it with her in detail.  She will proceed with cycle 6 day 1 of FEC.    #2 She will return in one week for labs and evaluation for any chemotoxicities.    All questions were answered.  Ms. Mates was encouraged to contact us in the interim with any problems, questions or concerns  I spent 15 minutes counseling the patient face to face.  The total time spent in the appointment was 30 minutes.  Minette Headland, [place] [phone] 08/04/2013, 10:24 AM

## 2013-08-03 ENCOUNTER — Ambulatory Visit (HOSPITAL_BASED_OUTPATIENT_CLINIC_OR_DEPARTMENT_OTHER): Payer: Medicare HMO

## 2013-08-03 VITALS — BP 145/67 | HR 82 | Temp 97.0°F | Resp 19

## 2013-08-03 DIAGNOSIS — C50412 Malignant neoplasm of upper-outer quadrant of left female breast: Secondary | ICD-10-CM

## 2013-08-03 DIAGNOSIS — Z5189 Encounter for other specified aftercare: Secondary | ICD-10-CM

## 2013-08-03 DIAGNOSIS — C50419 Malignant neoplasm of upper-outer quadrant of unspecified female breast: Secondary | ICD-10-CM

## 2013-08-03 MED ORDER — PEGFILGRASTIM INJECTION 6 MG/0.6ML
6.0000 mg | Freq: Once | SUBCUTANEOUS | Status: AC
  Administered 2013-08-03: 6 mg via SUBCUTANEOUS

## 2013-08-03 NOTE — Patient Instructions (Signed)

## 2013-08-05 ENCOUNTER — Ambulatory Visit: Payer: Medicare HMO

## 2013-08-06 ENCOUNTER — Inpatient Hospital Stay (HOSPITAL_COMMUNITY)
Admission: EM | Admit: 2013-08-06 | Discharge: 2013-08-09 | DRG: 392 | Disposition: A | Discharge status: Home / Self Care | Payer: Medicare HMO | Attending: Internal Medicine | Admitting: Internal Medicine

## 2013-08-06 ENCOUNTER — Other Ambulatory Visit: Payer: Self-pay

## 2013-08-06 ENCOUNTER — Encounter (HOSPITAL_COMMUNITY): Payer: Self-pay | Admitting: Emergency Medicine

## 2013-08-06 ENCOUNTER — Telehealth: Payer: Self-pay

## 2013-08-06 ENCOUNTER — Observation Stay (HOSPITAL_COMMUNITY): Payer: Medicare HMO

## 2013-08-06 ENCOUNTER — Emergency Department (HOSPITAL_COMMUNITY): Payer: Medicare HMO

## 2013-08-06 DIAGNOSIS — D702 Other drug-induced agranulocytosis: Secondary | ICD-10-CM | POA: Diagnosis present

## 2013-08-06 DIAGNOSIS — D638 Anemia in other chronic diseases classified elsewhere: Secondary | ICD-10-CM | POA: Diagnosis present

## 2013-08-06 DIAGNOSIS — K208 Other esophagitis without bleeding: Principal | ICD-10-CM | POA: Diagnosis present

## 2013-08-06 DIAGNOSIS — Z808 Family history of malignant neoplasm of other organs or systems: Secondary | ICD-10-CM

## 2013-08-06 DIAGNOSIS — Z801 Family history of malignant neoplasm of trachea, bronchus and lung: Secondary | ICD-10-CM

## 2013-08-06 DIAGNOSIS — Z803 Family history of malignant neoplasm of breast: Secondary | ICD-10-CM

## 2013-08-06 DIAGNOSIS — C50919 Malignant neoplasm of unspecified site of unspecified female breast: Secondary | ICD-10-CM | POA: Diagnosis present

## 2013-08-06 DIAGNOSIS — K219 Gastro-esophageal reflux disease without esophagitis: Secondary | ICD-10-CM | POA: Diagnosis present

## 2013-08-06 DIAGNOSIS — D0591 Unspecified type of carcinoma in situ of right breast: Secondary | ICD-10-CM

## 2013-08-06 DIAGNOSIS — E119 Type 2 diabetes mellitus without complications: Secondary | ICD-10-CM | POA: Diagnosis present

## 2013-08-06 DIAGNOSIS — Z87891 Personal history of nicotine dependence: Secondary | ICD-10-CM

## 2013-08-06 DIAGNOSIS — T451X5A Adverse effect of antineoplastic and immunosuppressive drugs, initial encounter: Secondary | ICD-10-CM | POA: Diagnosis present

## 2013-08-06 DIAGNOSIS — R1310 Dysphagia, unspecified: Secondary | ICD-10-CM | POA: Diagnosis present

## 2013-08-06 DIAGNOSIS — Z9010 Acquired absence of unspecified breast and nipple: Secondary | ICD-10-CM

## 2013-08-06 DIAGNOSIS — I2510 Atherosclerotic heart disease of native coronary artery without angina pectoris: Secondary | ICD-10-CM | POA: Diagnosis present

## 2013-08-06 DIAGNOSIS — Z8249 Family history of ischemic heart disease and other diseases of the circulatory system: Secondary | ICD-10-CM

## 2013-08-06 DIAGNOSIS — Z79899 Other long term (current) drug therapy: Secondary | ICD-10-CM

## 2013-08-06 DIAGNOSIS — Z9221 Personal history of antineoplastic chemotherapy: Secondary | ICD-10-CM

## 2013-08-06 DIAGNOSIS — D72829 Elevated white blood cell count, unspecified: Secondary | ICD-10-CM | POA: Diagnosis present

## 2013-08-06 DIAGNOSIS — F319 Bipolar disorder, unspecified: Secondary | ICD-10-CM | POA: Diagnosis present

## 2013-08-06 DIAGNOSIS — G4730 Sleep apnea, unspecified: Secondary | ICD-10-CM | POA: Diagnosis present

## 2013-08-06 DIAGNOSIS — E785 Hyperlipidemia, unspecified: Secondary | ICD-10-CM | POA: Diagnosis present

## 2013-08-06 DIAGNOSIS — I10 Essential (primary) hypertension: Secondary | ICD-10-CM | POA: Diagnosis present

## 2013-08-06 DIAGNOSIS — Z4889 Encounter for other specified surgical aftercare: Secondary | ICD-10-CM

## 2013-08-06 DIAGNOSIS — C50412 Malignant neoplasm of upper-outer quadrant of left female breast: Secondary | ICD-10-CM

## 2013-08-06 DIAGNOSIS — D696 Thrombocytopenia, unspecified: Secondary | ICD-10-CM | POA: Diagnosis present

## 2013-08-06 LAB — URINALYSIS, ROUTINE W REFLEX MICROSCOPIC
Bilirubin Urine: NEGATIVE
Glucose, UA: >1000 mg/dL — AB
Hgb urine dipstick: NEGATIVE
Ketones, ur: NEGATIVE mg/dL
LEUKOCYTES UA: NEGATIVE
Nitrite: NEGATIVE
PH: 6 (ref 5.0–8.0)
Protein, ur: 100 mg/dL — AB
SPECIFIC GRAVITY, URINE: 1.028 (ref 1.005–1.030)
Urobilinogen, UA: 1 mg/dL (ref 0.0–1.0)

## 2013-08-06 LAB — BASIC METABOLIC PANEL
BUN: 17 mg/dL (ref 6–23)
CHLORIDE: 98 meq/L (ref 96–112)
CO2: 29 mEq/L (ref 19–32)
Calcium: 9 mg/dL (ref 8.4–10.5)
Creatinine, Ser: 0.65 mg/dL (ref 0.50–1.10)
GFR calc Af Amer: >90 mL/min (ref >90)
GFR calc non Af Amer: >90 mL/min (ref >90)
Glucose, Bld: 302 mg/dL — ABNORMAL HIGH (ref 70–99)
POTASSIUM: 4.3 meq/L (ref 3.7–5.3)
Sodium: 137 mEq/L (ref 137–147)

## 2013-08-06 LAB — CBC
HEMATOCRIT: 27.8 % — AB (ref 36.0–46.0)
Hemoglobin: 9.2 g/dL — ABNORMAL LOW (ref 12.0–15.0)
MCH: 30.8 pg (ref 26.0–34.0)
MCHC: 33.1 g/dL (ref 30.0–36.0)
MCV: 93 fL (ref 78.0–100.0)
PLATELETS: 155 10*3/uL (ref 150–400)
RBC: 2.99 MIL/uL — ABNORMAL LOW (ref 3.87–5.11)
RDW: 15.8 % — ABNORMAL HIGH (ref 11.5–15.5)
WBC: 17.2 10*3/uL — AB (ref 4.0–10.5)

## 2013-08-06 LAB — URINE MICROSCOPIC-ADD ON

## 2013-08-06 MED ORDER — METOPROLOL TARTRATE 1 MG/ML IV SOLN
5.0000 mg | Freq: Two times a day (BID) | INTRAVENOUS | Status: DC
  Administered 2013-08-06 – 2013-08-08 (×4): 5 mg via INTRAVENOUS
  Filled 2013-08-06 (×5): qty 5

## 2013-08-06 MED ORDER — PANTOPRAZOLE SODIUM 40 MG IV SOLR
40.0000 mg | Freq: Two times a day (BID) | INTRAVENOUS | Status: DC
  Administered 2013-08-06 – 2013-08-08 (×5): 40 mg via INTRAVENOUS
  Filled 2013-08-06 (×8): qty 40

## 2013-08-06 MED ORDER — LEVOFLOXACIN IN D5W 750 MG/150ML IV SOLN
750.0000 mg | Freq: Once | INTRAVENOUS | Status: AC
  Administered 2013-08-07: 750 mg via INTRAVENOUS
  Filled 2013-08-06: qty 150

## 2013-08-06 MED ORDER — SODIUM CHLORIDE 0.9 % IV BOLUS (SEPSIS)
1000.0000 mL | Freq: Once | INTRAVENOUS | Status: AC
  Administered 2013-08-06: 1000 mL via INTRAVENOUS

## 2013-08-06 MED ORDER — HYDRALAZINE HCL 20 MG/ML IJ SOLN
10.0000 mg | INTRAMUSCULAR | Status: DC | PRN
  Administered 2013-08-07: 10 mg via INTRAVENOUS
  Filled 2013-08-06: qty 0.5
  Filled 2013-08-06: qty 1

## 2013-08-06 MED ORDER — INSULIN ASPART 100 UNIT/ML ~~LOC~~ SOLN
0.0000 [IU] | Freq: Three times a day (TID) | SUBCUTANEOUS | Status: DC
  Administered 2013-08-07: 3 [IU] via SUBCUTANEOUS
  Administered 2013-08-07: 5 [IU] via SUBCUTANEOUS
  Administered 2013-08-07 – 2013-08-08 (×3): 3 [IU] via SUBCUTANEOUS
  Administered 2013-08-08 – 2013-08-09 (×2): 2 [IU] via SUBCUTANEOUS

## 2013-08-06 MED ORDER — FLUCONAZOLE 200 MG PO TABS
200.0000 mg | ORAL_TABLET | Freq: Once | ORAL | Status: AC
  Administered 2013-08-06: 200 mg via ORAL
  Filled 2013-08-06: qty 1

## 2013-08-06 MED ORDER — MORPHINE SULFATE 2 MG/ML IJ SOLN
1.0000 mg | INTRAMUSCULAR | Status: DC | PRN
  Administered 2013-08-07: 2 mg via INTRAVENOUS
  Filled 2013-08-06: qty 1

## 2013-08-06 MED ORDER — SODIUM CHLORIDE 0.9 % IV SOLN
INTRAVENOUS | Status: DC
  Administered 2013-08-07 – 2013-08-09 (×7): via INTRAVENOUS

## 2013-08-06 MED ORDER — HEPARIN SODIUM (PORCINE) 5000 UNIT/ML IJ SOLN
5000.0000 [IU] | Freq: Three times a day (TID) | INTRAMUSCULAR | Status: DC
  Administered 2013-08-07 (×3): 5000 [IU] via SUBCUTANEOUS
  Filled 2013-08-06 (×5): qty 1

## 2013-08-06 NOTE — Telephone Encounter (Signed)
Pt complains of trouble swallowing liquids and solids, some shortness of breath and pain when taking a deep breath.  She states they had to try several times to access her port Friday and she has had trouble ever since her treatment Friday.  Advised to go to the ER per Dr. Humphrey Rolls.

## 2013-08-06 NOTE — H&P (Signed)
Hospitalist Admission History and Physical  Patient name: Kathy Maxwell Medical record number: [id number] Date of birth: 1955/10/08 Age: 58 y.o. Gender: female  Primary Care Provider: Maximino Greenland, MD Oncologist: Humphrey Rolls  Chief Complaint: odynophagia, dysphagia  History of Present Illness:This is a 58 y.o. year old female with prior hx/o breast cancer s/p mastectomy of L breast and lumpectomy of R breast currently on chemotherapy presenting with odynophagia and dysphagia x 3 days. Pt states that she has had progressively difficulty over the last 3 days eating and drinking. Has had progressively worsening pain and inability to swallow solid foods. No fevers or chills. Pt does report receiving chemotherapy within the last week. Denies any radiation. Also reports getting abx prior to chemotherapy. Mild R sided abd pain. Otherwise no complaints including nausea, vomiting, diarrhea, SOB, dysuria. Has had CP assd with swallowing.  Pt presented to the ER with sxs. Initially had WBC @ 17.2, Afebrile. CXR w/ stable R sided port a cath ad minimal bibasilar airspace disease.  Hgb @ 9.2 ( baseline hgb trending 9-11). UA pending. GI consulted with recommendation for admission and am consult.     Patient Active Problem List   Diagnosis Date Noted  . Odynophagia 08/06/2013  . 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 Hypertension 04/04/2013  . Hyperlipidemia 04/04/2013  . Bipolar 1 disorder 04/04/2013  . Breast cancer of upper-outer quadrant of left female breast 03/15/2013   Past Medical History: Past Medical History  Diagnosis Date  . Hyperlipidemia   . Hypertension   . Bipolar 1 disorder   . Cancer   . Hypertension 04/04/2013  . Hyperlipidemia 04/04/2013  . Bipolar 1 disorder 04/04/2013  . Breast cancer 03/10/13    left breast invasive ca  . Allergy     latex  . Obesity   . Diabetes mellitus without complication     NIDDM  . Sleep apnea     no cpap due to insurance  . Heart murmur   .  GERD (gastroesophageal reflux disease)   . Coronary artery disease     Past Surgical History: Past Surgical History  Procedure Laterality Date  . Cholecystectomy    . Breast biopsy Left 05/08/09    fibroadenoma with microcalcifications,no malignancy id  . Breast biopsy Left 03/07/13    invasive ca,1 lymph node metastatic  . 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 Abdominal hysterectomy    . Coronary stent placement  2001  . Cardiac catheterization    . Eye surgery  6/13,9/14    laser,cataract lft  . Coronary angioplasty      LAD stent 2003  . Portacath placement Right 04/22/2013    Procedure: INSERTION PORT-A-CATH;  Surgeon: Adin Hector, MD;  Location: [place];  Service: General;  Laterality: Right;  . Mastectomy modified radical Left 04/22/2013    Procedure: MASTECTOMY MODIFIED RADICAL;  Surgeon: Adin Hector, MD;  Location: [place];  Service: General;  Laterality: Left;  . Breast lumpectomy with needle localization and axillary sentinel lymph node bx Right 04/22/2013    Procedure: BREAST LUMPECTOMY WITH NEEDLE LOCALIZATION AND AXILLARY SENTINEL LYMPH NODE BX;  Surgeon: Adin Hector, MD;  Location: [place];  Service: General;  Laterality: Right;    Social History: History   Social History  . Marital Status: Single    Spouse Name: N/A    Number of Children: 0  . Years of Education: N/A   Occupational History  .  DISABLED    Social History Main Topics  . Smoking status: Former Smoker -- 1.00 packs/day for 15 years    Types: Cigarettes    Quit date: 07/19/1987  . Smokeless tobacco: Never Used  . Alcohol Use: No  . Drug Use: No  . Sexual Activity: No   Other Topics Concern  . None   Social History Narrative  . None    Family History: Family History  Problem Relation Age of Onset  . Heart disease Mother   . Breast cancer Maternal Aunt     dx in her 30s  . Heart disease Cousin 78    maternal cousin  .  Heart attack Sister 37  . Cancer Paternal Aunt     NOS  . Cancer Maternal Aunt     NOS  . Lung cancer Cousin 39    maternal cousin - non smoker  . Brain cancer Cousin 13    maternal cousin's son  . Lung cancer Cousin 84    maternal cousin's son  . Breast cancer Cousin     paternal cousin    Allergies: Allergies  Allergen Reactions  . Adhesive [Tape] Rash    Clear tape gloves ,elastic no problem    Current Facility-Administered Medications  Medication Dose Route Frequency Provider Last Rate Last Dose  . 0.9 %  sodium chloride infusion   Intravenous Continuous Kathy Howells, MD      . heparin injection 5,000 Units  5,000 Units Subcutaneous Q8H Kathy Howells, MD      . metoprolol (LOPRESSOR) injection 5 mg  5 mg Intravenous Q12H Kathy Howells, MD      . morphine 2 MG/ML injection 1-2 mg  1-2 mg Intravenous Q2H PRN Kathy Howells, MD      . pantoprazole (PROTONIX) injection 40 mg  40 mg Intravenous Q12H Kathy Howells, MD      . sodium chloride 0.9 % bolus 1,000 mL  1,000 mL Intravenous Once Osvaldo Shipper, MD       Current Outpatient Prescriptions  Medication Sig Dispense Refill  . amLODipine (NORVASC) 10 MG tablet Take 10 mg by mouth daily.      . Azilsartan-Chlorthalidone 40-25 MG TABS Take 1 tablet by mouth daily.       [name] buPROPion (WELLBUTRIN SR) 150 MG 12 hr tablet Take 150 mg by mouth daily.       [name] dexamethasone (DECADRON) 4 MG tablet Take 2 tablets by mouth once a day on the day after chemotherapy and then take 2 tablets two times a day for 2 days. Take with food.  30 tablet  1  . hydrALAZINE (APRESOLINE) 25 MG tablet Take 25 mg by mouth 2 (two) times daily.      [name] HYDROcodone-acetaminophen (NORCO/VICODIN) 5-325 MG per tablet Take 1-2 tablets by mouth every 4 (four) hours as needed for pain.  30 tablet  0  . lidocaine-prilocaine (EMLA) cream Apply topically as needed.  30 g  0  . LORazepam (ATIVAN) 0.5 MG tablet Take 1 tablet (0.13m) every 6 hours as needed for nausea  or vomiting.  30 tablet  0  . losartan (COZAAR) 100 MG tablet Take 100 mg by mouth daily.       . metoprolol succinate (TOPROL-XL) 25 MG 24 hr tablet Take 25 mg by mouth daily.      . ondansetron (ZOFRAN) 8 MG tablet Take 1 tablet (814m) every 12 hours as needed for nausea or vomiting starting the 3rd day after chemotherapy.  30 tablet  1  . pantoprazole (PROTONIX) 40 MG tablet Take 40 mg by mouth daily.      . pioglitazone-metformin (ACTOPLUS MET) 15-850 MG per tablet Take 1 tablet by mouth 2 (two) times daily with a meal.      . PRESCRIPTION MEDICATION palonosetron (ALOXI) injection 0.25 mg 0.25 mg  Once 08/02/2013 08/02/2013      Route: Intravenous      . PRESCRIPTION MEDICATION epirubicin (ELLENCE) chemo injection 200 mg 100 mg/m2  2 m2 (Order-Specific)  Once 08/02/2013      . PRESCRIPTION MEDICATION fluorouracil (ADRUCIL) chemo injection 1,000 mg 500 mg/m2  2.02 m2 (Treatment Plan Actual)  Once 08/02/2013      . PRESCRIPTION MEDICATION cyclophosphamide (CYTOXAN) 1,000 mg in sodium chloride 0.9 % 250 mL chemo infusion 500 mg/m2  2 m2 (Order-Specific)  Once 08/02/2013      . prochlorperazine (COMPAZINE) 10 MG tablet Take 1 tablet (10 mg total) by mouth every 6 (six) hours as needed (Nausea or vomiting).  30 tablet  1  . rosuvastatin (CRESTOR) 5 MG tablet Take 5 mg by mouth daily.      . sitaGLIPtin (JANUVIA) 100 MG tablet Take 100 mg by mouth daily.       Facility-Administered Medications Ordered in Other Encounters  Medication Dose Route Frequency Provider Last Rate Last Dose  . sodium chloride 0.9 % injection 10 mL  10 mL Intravenous PRN [name], MD   10 mL at 05/24/13 1522   Review Of Systems: 12 point ROS negative except as noted above in HPI.  Physical Exam: Filed Vitals:   08/06/13 1747  BP: 185/68  Pulse: 87  Temp: 98.7 F (37.1 C)  Resp: 16    General: alert, cooperative and moderately obese HEENT: PERRLA, extra ocular movement intact and oropharynx clear, no  lesions Heart: S1, S2 normal, no murmur, rub or gallop, regular rate and rhythm Lungs: clear to auscultation, no wheezes or rales and unlabored breathing Abdomen: abdomen is soft without significant tenderness, masses, organomegaly or guarding Extremities: extremities normal, atraumatic, no cyanosis or edema Skin:no rashes Neurology: normal without focal findings  Labs and Imaging: Lab Results  Component Value Date/Time   NA 137 08/06/2013  8:50 PM   NA 142 08/02/2013 12:00 PM   K 4.3 08/06/2013  8:50 PM   K 3.2* 08/02/2013 12:00 PM   CL 98 08/06/2013  8:50 PM   CO2 29 08/06/2013  8:50 PM   CO2 28 08/02/2013 12:00 PM   BUN 17 08/06/2013  8:50 PM   BUN 7.4 08/02/2013 12:00 PM   CREATININE 0.65 08/06/2013  8:50 PM   CREATININE 0.8 08/02/2013 12:00 PM   GLUCOSE 302* 08/06/2013  8:50 PM   GLUCOSE 153* 08/02/2013 12:00 PM   Lab Results  Component Value Date   WBC 17.2* 08/06/2013   HGB 9.2* 08/06/2013   HCT 27.8* 08/06/2013   MCV 93.0 08/06/2013   PLT 155 08/06/2013   Urinalysis No results found for this basename: colorurine, appearanceur, labspec, phurine, glucoseu, hgbur, bilirubinur, ketonesur, proteinur, urobilinogen, nitrite, leukocytesur       Dg Chest 2 View  08/06/2013   CLINICAL DATA:  Central chest pain. Difficulty accessing right-sided chest port.  EXAM: CHEST  2 VIEW  COMPARISON:  NM PET IMAGE INITIAL (PI) SKULL BASE TO THIGH dated 04/29/2013; DG CHEST 1V PORT dated 04/22/2013  FINDINGS: The lungs are well-aerated. Pulmonary vascularity is at the upper limits of normal. Minimal bibasilar opacities likely reflect atelectasis.  There is no evidence of pleural effusion or pneumothorax.  The heart is normal in size; the mediastinal contour is within normal limits. No acute osseous abnormalities are seen. A right-sided chest port is noted ending within the right atrium. Clips are seen at the left axilla. Clips are noted within the right upper quadrant, reflecting prior cholecystectomy.   IMPRESSION: 1. Right-sided chest port again seen ending within the right atrium. 2. Minimal bibasilar airspace opacities likely reflect atelectasis.   Electronically Signed   By: Garald Balding M.D.   On: 08/06/2013 22:31     Assessment and Plan: MYRIAH BOGGUS is a 58 y.o. year old female presenting with odynophagia  Odynophagia/dysphagia: Fairly broad differential for sxs including medication induced, fungal/infectious, reflux. Will place on high dose PPI.  NPO pending GI follow up. Obtain KUB given minimal to mild sided abd pain. Consider CT imaging if pain worsens.   Leukocytosis: Unclear etiology. Baseline higher risk of infection in setting of active chemotherapy. Will give 1 dose of IV levaquin for resp and GU coverage given bibasilar infiltrate findings on CXR. UA pending. Will follow closely.   HTN: Hold PO meds. IV metoprolol. PRN IV hydralazine.  Follow closely.   Anemia: Hgb near LL of baseline. Likely2/2 chemotherapy. No active singns of bleeding. Follow closely.  FEN: NS. Follow closely.  Prophylaxis: subq hepari.  Disposition: pending further eval Code Status:Full Code.        Kathy Howells MD  Pager: [phone]

## 2013-08-06 NOTE — ED Notes (Signed)
Pt states that she had chemo last week and has been trouble with accessing her port, no one can get it.  States that after that, she started having pain during swallowing.  States that it feels like things get hung up in her throat.

## 2013-08-07 ENCOUNTER — Encounter (HOSPITAL_COMMUNITY): Payer: Self-pay | Admitting: Neurology

## 2013-08-07 ENCOUNTER — Other Ambulatory Visit: Payer: Self-pay | Admitting: *Deleted

## 2013-08-07 ENCOUNTER — Encounter (HOSPITAL_COMMUNITY)
Admission: EM | Disposition: A | Discharge status: Home / Self Care | Payer: Self-pay | Source: Home / Self Care | Attending: Internal Medicine

## 2013-08-07 DIAGNOSIS — C50419 Malignant neoplasm of upper-outer quadrant of unspecified female breast: Secondary | ICD-10-CM

## 2013-08-07 DIAGNOSIS — R1310 Dysphagia, unspecified: Secondary | ICD-10-CM

## 2013-08-07 DIAGNOSIS — F319 Bipolar disorder, unspecified: Secondary | ICD-10-CM

## 2013-08-07 DIAGNOSIS — C50412 Malignant neoplasm of upper-outer quadrant of left female breast: Secondary | ICD-10-CM

## 2013-08-07 HISTORY — PX: ESOPHAGOGASTRODUODENOSCOPY: SHX5428

## 2013-08-07 LAB — COMPREHENSIVE METABOLIC PANEL
ALBUMIN: 2.9 g/dL — AB (ref 3.5–5.2)
ALK PHOS: 124 U/L — AB (ref 39–117)
ALT: 7 U/L (ref 0–35)
AST: 9 U/L (ref 0–37)
BILIRUBIN TOTAL: 0.3 mg/dL (ref 0.3–1.2)
BUN: 12 mg/dL (ref 6–23)
CHLORIDE: 102 meq/L (ref 96–112)
CO2: 28 mEq/L (ref 19–32)
Calcium: 7.7 mg/dL — ABNORMAL LOW (ref 8.4–10.5)
Creatinine, Ser: 0.55 mg/dL (ref 0.50–1.10)
GFR calc Af Amer: >90 mL/min (ref >90)
GFR calc non Af Amer: >90 mL/min (ref >90)
Glucose, Bld: 208 mg/dL — ABNORMAL HIGH (ref 70–99)
Potassium: 3.9 mEq/L (ref 3.7–5.3)
SODIUM: 138 meq/L (ref 137–147)
Total Protein: 5.2 g/dL — ABNORMAL LOW (ref 6.0–8.3)

## 2013-08-07 LAB — CBC WITH DIFFERENTIAL/PLATELET
Basophils Absolute: 0.1 10*3/uL (ref 0.0–0.1)
Basophils Relative: 1 % (ref 0–1)
EOS PCT: 0 % (ref 0–5)
Eosinophils Absolute: 0 10*3/uL (ref 0.0–0.7)
HCT: 23.6 % — ABNORMAL LOW (ref 36.0–46.0)
Hemoglobin: 7.9 g/dL — ABNORMAL LOW (ref 12.0–15.0)
LYMPHS ABS: 0.6 10*3/uL — AB (ref 0.7–4.0)
Lymphocytes Relative: 8 % — ABNORMAL LOW (ref 12–46)
MCH: 31 pg (ref 26.0–34.0)
MCHC: 33.5 g/dL (ref 30.0–36.0)
MCV: 92.5 fL (ref 78.0–100.0)
MONO ABS: 0.1 10*3/uL (ref 0.1–1.0)
Monocytes Relative: 1 % — ABNORMAL LOW (ref 3–12)
Neutro Abs: 6.5 10*3/uL (ref 1.7–7.7)
Neutrophils Relative %: 90 % — ABNORMAL HIGH (ref 43–77)
Platelets: 118 10*3/uL — ABNORMAL LOW (ref 150–400)
RBC: 2.55 MIL/uL — AB (ref 3.87–5.11)
RDW: 15.8 % — AB (ref 11.5–15.5)
WBC: 7.3 10*3/uL (ref 4.0–10.5)

## 2013-08-07 LAB — GLUCOSE, CAPILLARY
Glucose-Capillary: 155 mg/dL — ABNORMAL HIGH (ref 70–99)
Glucose-Capillary: 184 mg/dL — ABNORMAL HIGH (ref 70–99)
Glucose-Capillary: 203 mg/dL — ABNORMAL HIGH (ref 70–99)
Glucose-Capillary: 209 mg/dL — ABNORMAL HIGH (ref 70–99)
Glucose-Capillary: 233 mg/dL — ABNORMAL HIGH (ref 70–99)

## 2013-08-07 LAB — HEMOGLOBIN A1C
Hgb A1c MFr Bld: 7.4 % — ABNORMAL HIGH (ref <5.7)
Mean Plasma Glucose: 166 mg/dL — ABNORMAL HIGH (ref <117)

## 2013-08-07 SURGERY — EGD (ESOPHAGOGASTRODUODENOSCOPY)
Anesthesia: Moderate Sedation

## 2013-08-07 MED ORDER — MIDAZOLAM HCL 10 MG/2ML IJ SOLN
INTRAMUSCULAR | Status: DC | PRN
  Administered 2013-08-07: 1 mg via INTRAVENOUS
  Administered 2013-08-07: 2 mg via INTRAVENOUS

## 2013-08-07 MED ORDER — SODIUM CHLORIDE 0.9 % IV SOLN
INTRAVENOUS | Status: DC
  Administered 2013-08-07: 500 mL via INTRAVENOUS

## 2013-08-07 MED ORDER — SUCRALFATE 1 GM/10ML PO SUSP
1.0000 g | Freq: Three times a day (TID) | ORAL | Status: DC
  Administered 2013-08-07 – 2013-08-09 (×9): 1 g via ORAL
  Filled 2013-08-07 (×13): qty 10

## 2013-08-07 MED ORDER — MIDAZOLAM HCL 10 MG/2ML IJ SOLN
INTRAMUSCULAR | Status: AC
  Filled 2013-08-07: qty 2

## 2013-08-07 MED ORDER — FENTANYL CITRATE 0.05 MG/ML IJ SOLN
INTRAMUSCULAR | Status: DC | PRN
  Administered 2013-08-07 (×2): 25 ug via INTRAVENOUS

## 2013-08-07 MED ORDER — FENTANYL CITRATE 0.05 MG/ML IJ SOLN
INTRAMUSCULAR | Status: AC
Start: 2013-08-07 — End: 2013-08-07
  Filled 2013-08-07: qty 2

## 2013-08-07 MED ORDER — INSULIN GLARGINE 100 UNIT/ML ~~LOC~~ SOLN
18.0000 [IU] | Freq: Every day | SUBCUTANEOUS | Status: DC
  Administered 2013-08-07 – 2013-08-08 (×2): 18 [IU] via SUBCUTANEOUS
  Filled 2013-08-07 (×3): qty 0.18

## 2013-08-07 NOTE — Progress Notes (Signed)
Inpatient Diabetes Program Recommendations  AACE/ADA: New Consensus Statement on Inpatient Glycemic Control (2013)  Target Ranges:  Prepandial:   less than 140 mg/dL      Peak postprandial:   less than 180 mg/dL (1-2 hours)      Critically ill patients:  140 - 180 mg/dL   Reason for Visit: Results for SAMIA, KUKLA (MRN [id number]) as of 08/07/2013 11:35  Ref. Range 08/07/2013 00:17 08/07/2013 07:59  Glucose-Capillary Latest Range: 70-99 mg/dL 233 (H) 203 (H)   Note history of diabetes.  Patient on oral diabetes medications at home.  While in hospital, consider adding Lantus 18 units daily (0.2 units/kg).  A1C indicates appropriate control.    Thanks, Adah Perl, RN, BC-ADM Inpatient Diabetes Coordinator Pager [phone]

## 2013-08-07 NOTE — Consult Note (Signed)
Referring Provider: Dr. Manson Passey Primary Care Physician:  Gwynneth Aliment, MD Primary Gastroenterologist:  None (unassigned)  Reason for Consultation:  Odynophagia and dysphagia  HPI: Kathy Maxwell is a 58 y.o. female who recently completed her first 6 week course of chemotherapy for breast cancer, and to several days ago began to have significant chest discomfort, odynophagia, and some degree of dysphagia. She does not have chest discomfort unless she actually swallows. No problem with nausea, vomiting, or abdominal pain. I don't believe she has had any fevers.  She is diabetic.  She has never previously had this sort of problem in the past, nor has she had previous endoscopy. Specifically, there is no prior history of fungal esophagitis, although she has had yeast vaginitis in the past.  Of note, the patient has never had screening colonoscopy.   Past Medical History  Diagnosis Date  . Hyperlipidemia   . Hypertension   . Bipolar 1 disorder   . Cancer   . Hypertension 04/04/2013  . Hyperlipidemia 04/04/2013  . Bipolar 1 disorder 04/04/2013  . Breast cancer 03/10/13    left breast invasive ca  . Allergy     latex  . Obesity   . Diabetes mellitus without complication     NIDDM  . Sleep apnea     no cpap due to insurance  . Heart murmur   . GERD (gastroesophageal reflux disease)   . Coronary artery disease     Past Surgical History  Procedure Laterality Date  . Cholecystectomy    . Breast biopsy Left 05/08/09    fibroadenoma with microcalcifications,no malignancy id  . Breast biopsy Left 03/07/13    invasive ca,1 lymph node metastatic  . 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 Abdominal hysterectomy    . Coronary stent placement  2001  . Cardiac catheterization    . Eye surgery  6/13,9/14    laser,cataract lft  . Coronary angioplasty      LAD stent 2003  . Portacath placement Right 04/22/2013    Procedure: INSERTION  PORT-A-CATH;  Surgeon: [name], MD;  Location: [facility] OR;  Service: General;  Laterality: Right;  . Mastectomy modified radical Left 04/22/2013    Procedure: MASTECTOMY MODIFIED RADICAL;  Surgeon: [name], MD;  Location: [facility] OR;  Service: General;  Laterality: Left;  . Breast lumpectomy with needle localization and axillary sentinel lymph node bx Right 04/22/2013    Procedure: BREAST LUMPECTOMY WITH NEEDLE LOCALIZATION AND AXILLARY SENTINEL LYMPH NODE BX;  Surgeon: [name], MD;  Location: [facility] OR;  Service: General;  Laterality: Right;    Prior to Admission medications   Medication Sig Start Date End Date Taking? Authorizing Provider  amLODipine (NORVASC) 10 MG tablet Take 10 mg by mouth daily.   Yes Historical Provider, MD  Azilsartan-Chlorthalidone 40-25 MG TABS Take 1 tablet by mouth daily.    Yes Historical Provider, MD  buPROPion (WELLBUTRIN SR) 150 MG 12 hr tablet Take 150 mg by mouth daily.    Yes Historical Provider, MD  dexamethasone (DECADRON) 4 MG tablet Take 2 tablets by mouth once a day on the day after chemotherapy and then take 2 tablets two times a day for 2 days. Take with food. 05/07/13  Yes Victorino December, MD  hydrALAZINE (APRESOLINE) 25 MG tablet Take 25 mg by mouth 2 (two) times daily.   Yes Historical Provider, MD  HYDROcodone-acetaminophen (NORCO/VICODIN) 5-325 MG per tablet Take 1-2 tablets by  mouth every 4 (four) hours as needed for pain. 04/29/13  Yes Adin Hector, MD  lidocaine-prilocaine (EMLA) cream Apply topically as needed. 05/07/13  Yes [name], MD  LORazepam (ATIVAN) 0.5 MG tablet Take 1 tablet (0.$RemoveBef'5mg'SqOoZvqqYv$ ) every 6 hours as needed for nausea or vomiting. 05/07/13  Yes [name], MD  losartan (COZAAR) 100 MG tablet Take 100 mg by mouth daily.  03/18/13  Yes Historical Provider, MD  metoprolol succinate (TOPROL-XL) 25 MG 24 hr tablet Take 25 mg by mouth daily.   Yes Historical Provider, MD  ondansetron (ZOFRAN) 8 MG tablet Take 1 tablet  ($Remove'8mg'VlnwNPM$ s) every 12 hours as needed for nausea or vomiting starting the 3rd day after chemotherapy. 05/07/13  Yes [name], MD  pantoprazole (PROTONIX) 40 MG tablet Take 40 mg by mouth daily.   Yes Historical Provider, MD  pioglitazone-metformin (ACTOPLUS MET) 15-850 MG per tablet Take 1 tablet by mouth 2 (two) times daily with a meal.   Yes Historical Provider, MD  PRESCRIPTION MEDICATION palonosetron (ALOXI) injection 0.25 mg 0.25 mg  Once 08/02/2013 08/02/2013      Route: Intravenous   Yes Historical Provider, MD  PRESCRIPTION MEDICATION epirubicin (ELLENCE) chemo injection 200 mg 100 mg/m2  2 m2 (Order-Specific)  Once 08/02/2013   Yes Historical Provider, MD  PRESCRIPTION MEDICATION fluorouracil (ADRUCIL) chemo injection 1,000 mg 500 mg/m2  2.02 m2 (Treatment Plan Actual)  Once 08/02/2013   Yes Historical Provider, MD  PRESCRIPTION MEDICATION cyclophosphamide (CYTOXAN) 1,000 mg in sodium chloride 0.9 % 250 mL chemo infusion 500 mg/m2  2 m2 (Order-Specific)  Once 08/02/2013   Yes Historical Provider, MD  prochlorperazine (COMPAZINE) 10 MG tablet Take 1 tablet (10 mg total) by mouth every 6 (six) hours as needed (Nausea or vomiting). 05/07/13  Yes [name], MD  rosuvastatin (CRESTOR) 5 MG tablet Take 5 mg by mouth daily.   Yes Historical Provider, MD  sitaGLIPtin (JANUVIA) 100 MG tablet Take 100 mg by mouth daily.   Yes Historical Provider, MD    Current Facility-Administered Medications  Medication Dose Route Frequency Provider Last Rate Last Dose  . 0.9 %  sodium chloride infusion   Intravenous Continuous Shanda Howells, MD 125 mL/hr at 08/07/13 0024    . heparin injection 5,000 Units  5,000 Units Subcutaneous Q8H Shanda Howells, MD   5,000 Units at 08/07/13 0537  . hydrALAZINE (APRESOLINE) injection 10 mg  10 mg Intravenous Q4H PRN Shanda Howells, MD   10 mg at 08/07/13 0024  . insulin aspart (novoLOG) injection 0-15 Units  0-15 Units Subcutaneous TID WC Shanda Howells, MD      . metoprolol  (LOPRESSOR) injection 5 mg  5 mg Intravenous Q12H Shanda Howells, MD   5 mg at 08/06/13 2343  . morphine 2 MG/ML injection 1-2 mg  1-2 mg Intravenous Q2H PRN Shanda Howells, MD      . pantoprazole (PROTONIX) injection 40 mg  40 mg Intravenous Q12H Shanda Howells, MD   40 mg at 08/06/13 2345   Facility-Administered Medications Ordered in Other Encounters  Medication Dose Route Frequency Provider Last Rate Last Dose  . sodium chloride 0.9 % injection 10 mL  10 mL Intravenous PRN [name], MD   10 mL at 05/24/13 1522    Allergies as of 08/06/2013 - Review Complete 08/06/2013  Allergen Reaction Noted  . Adhesive [tape] Rash 04/17/2013    Family History  Problem Relation Age of Onset  . Heart disease Mother   .  Breast cancer Maternal Aunt     dx in her 105s  . Heart disease Cousin 35    maternal cousin  . Heart attack Sister 53  . Cancer Paternal Aunt     NOS  . Cancer Maternal Aunt     NOS  . Lung cancer Cousin 64    maternal cousin - non smoker  . Brain cancer Cousin 13    maternal cousin's son  . Lung cancer Cousin 88    maternal cousin's son  . Breast cancer Cousin     paternal cousin    History   Social History  . Marital Status: Single    Spouse Name: N/A    Number of Children: 0  . Years of Education: N/A   Occupational History  . DISABLED    Social History Main Topics  . Smoking status: Former Smoker -- 1.00 packs/day for 15 years    Types: Cigarettes    Quit date: 07/19/1987  . Smokeless tobacco: Never Used  . Alcohol Use: No  . Drug Use: No  . Sexual Activity: No   Other Topics Concern  . Not on file   Social History Narrative  . No narrative on file    Review of Systems: See history of present illness  Physical Exam: Vital signs in last 24 hours: Temp:  [98.1 F (36.7 C)-98.7 F (37.1 C)] 98.2 F (36.8 C) (01/21 0406) Pulse Rate:  [72-87] 79 (01/21 0406) Resp:  [16-18] 16 (01/21 0406) BP: (166-187)/(68-79) 166/70 mmHg (01/21  0406) SpO2:  [98 %-100 %] 98 % (01/21 0406) Weight:  [95.301 kg (210 lb 1.6 oz)] 95.301 kg (210 lb 1.6 oz) (01/21 0020) Last BM Date: 08/05/13  This is a moderately overweight, pleasant and articulate African American female who appears perhaps very slightly uncomfortable but is not in any acute distress. She is anicteric and without overt pallor. Chest is clear anteriorly, heart rhythm normal, perhaps a 1/6 systolic murmur, no arrhythmia. Oropharynx is benign, specifically no oral candidiasis observed. Abdomen is moderately obese but without significant tenderness or mass effect.  Intake/Output from previous day: 01/20 0701 - 01/21 0700 In: 575 [I.V.:575] Out: -  Intake/Output this shift:    Lab Results:  Recent Labs  08/06/13 2050 08/07/13 0424  WBC 17.2* 7.3  HGB 9.2* 7.9*  HCT 27.8* 23.6*  PLT 155 118*   BMET  Recent Labs  08/06/13 2050 08/07/13 0424  NA 137 138  K 4.3 3.9  CL 98 102  CO2 29 28  GLUCOSE 302* 208*  BUN 17 12  CREATININE 0.65 0.55  CALCIUM 9.0 7.7*   LFT  Recent Labs  08/07/13 0424  PROT 5.2*  ALBUMIN 2.9*  AST 9  ALT 7  ALKPHOS 124*  BILITOT 0.3   PT/INR No results found for this basename: LABPROT, INR,  in the last 72 hours   Studies/Results: Dg Chest 2 View  08/06/2013   CLINICAL DATA:  Central chest pain. Difficulty accessing right-sided chest port.  EXAM: CHEST  2 VIEW  COMPARISON:  NM PET IMAGE INITIAL (PI) SKULL BASE TO THIGH dated 04/29/2013; DG CHEST 1V PORT dated 04/22/2013  FINDINGS: The lungs are well-aerated. Pulmonary vascularity is at the upper limits of normal. Minimal bibasilar opacities likely reflect atelectasis. There is no evidence of pleural effusion or pneumothorax.  The heart is normal in size; the mediastinal contour is within normal limits. No acute osseous abnormalities are seen. A right-sided chest port is noted ending within the  right atrium. Clips are seen at the left axilla. Clips are noted within the right  upper quadrant, reflecting prior cholecystectomy.  IMPRESSION: 1. Right-sided chest port again seen ending within the right atrium. 2. Minimal bibasilar airspace opacities likely reflect atelectasis.   Electronically Signed   By: Garald Balding M.D.   On: 08/06/2013 22:31   Dg Abd 1 View  08/07/2013   CLINICAL DATA:  Mid abdominal pain.  History of breast cancer.  EXAM: ABDOMEN - 1 VIEW  COMPARISON:  NM PET IMAGE INITIAL (PI) SKULL BASE TO THIGH dated 04/29/2013; CT ABD/PELVIS W CM dated 04/17/2013  FINDINGS: Two supine views. Cholecystectomy clips. Non-obstructive bowel gas pattern. Distal gas and stool. No abnormal abdominal calcifications. No appendicolith.  IMPRESSION: No acute findings.   Electronically Signed   By: Abigail Miyamoto M.D.   On: 08/07/2013 00:02    Impression: Dysphagia and odynophagia, subacute onset, in a patient with multiple risk factors for opportunistic infection (recent chemotherapy with mild neutropenia, and diabetes)  Plan:  Endoscopic evaluation later today. Petra Kuba, purpose, and risks reviewed. Patient is desirous of proceeding, as opposed to having empiric therapy. Note that she did receive one dose of Diflucan 200 mg orally last night, but so far, she has not experienced any symptomatic improvement.   LOS: 1 day   S[facility] Ahlgren V  08/07/2013, 9:30 AM

## 2013-08-07 NOTE — Op Note (Signed)
[facility] [place], [address]   ENDOSCOPY PROCEDURE REPORT  PATIENT: Kathy Maxwell, Kathy Maxwell  MR#: [id number] BIRTHDATE: 1956/02/10 , 69  yrs. old GENDER: Female ENDOSCOPIST:Velora Horstman Amedeo Plenty, MD REFERRED BY: PROCEDURE DATE:  08/07/2013 PROCEDURE: ASA CLASS: INDICATIONS:   odynophagia in the setting of neutropenia related to chemotherapy MEDICATION:    fentanyl 50 mcg, Versed 3 mg TOPICAL ANESTHETIC:   Cetacaine spray  DESCRIPTION OF PROCEDURE:   esophagus: Scattered erosions with exudate primarily in the mid esophagus. The esophagus had a dusky somewhat edematous appearance but no classic candidal plaques were seen. There were no vesicles or large ulcers directly suggestive of herpetic or CMV esophagitis. The distal esophagus was somewhat friable and dusky suggesting a component of reflux. Pill-induced esophagitis could not be ruled out. Brushings and biopsies were taken of the middle esophagus Stomach: Normal  Duodenum: Normal     COMPLICATIONS: None  ENDOSCOPIC IMPRESSION:nonspecific appearance of middle and distal esophagitis rule out reflux, infectious, or pill-induced esophagitis  RECOMMENDATIONS:continue Diflucan while awaiting brushings and biopsies and will add Carafate elixir appear    _______________________________ Lorrin MaisTeena Irani, MD 08/07/2013 11:39 AM

## 2013-08-07 NOTE — Progress Notes (Signed)
EGD done: Nonspecific erosions with exudate in the midesophagus, not classic for Candida, herpetic, or CMV esophagitis, could be a component of reflux or even pill-induced esophagitis. Recommend await biopsies and brushings and continue Diflucan and we'll treat with Carafate elixir and a PPI.

## 2013-08-07 NOTE — ED Provider Notes (Signed)
CSN: [id number]     Arrival date & time 08/06/13  1731 History   First MD Initiated Contact with Patient 08/06/13 2013     Chief Complaint  Patient presents with  . Dysphagia  . Sore Throat   (Consider location/radiation/quality/duration/timing/severity/associated sxs/prior Treatment) Patient is a 58 y.o. female presenting with pharyngitis. The history is provided by the patient.  Sore Throat This is a new problem. The current episode started more than 2 days ago. The problem occurs constantly. The problem has been gradually worsening. Pertinent negatives include no chest pain, no abdominal pain, no headaches and no shortness of breath. Nothing aggravates the symptoms. Nothing relieves the symptoms.    Past Medical History  Diagnosis Date  . Hyperlipidemia   . Hypertension   . Bipolar 1 disorder   . Cancer   . Hypertension 04/04/2013  . Hyperlipidemia 04/04/2013  . Bipolar 1 disorder 04/04/2013  . Breast cancer 03/10/13    left breast invasive ca  . Allergy     latex  . Obesity   . Diabetes mellitus without complication     NIDDM  . Sleep apnea     no cpap due to insurance  . Heart murmur   . GERD (gastroesophageal reflux disease)   . Coronary artery disease    Past Surgical History  Procedure Laterality Date  . Cholecystectomy    . Breast biopsy Left 05/08/09    fibroadenoma with microcalcifications,no malignancy id  . Breast biopsy Left 03/07/13    invasive ca,1 lymph node metastatic  . 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 Abdominal hysterectomy    . Coronary stent placement  2001  . Cardiac catheterization    . Eye surgery  6/13,9/14    laser,cataract lft  . Coronary angioplasty      LAD stent 2003  . Portacath placement Right 04/22/2013    Procedure: INSERTION PORT-A-CATH;  Surgeon: Adin Hector, MD;  Location: [place];  Service: General;  Laterality: Right;  . Mastectomy modified radical Left 04/22/2013   Procedure: MASTECTOMY MODIFIED RADICAL;  Surgeon: Adin Hector, MD;  Location: [place];  Service: General;  Laterality: Left;  . Breast lumpectomy with needle localization and axillary sentinel lymph node bx Right 04/22/2013    Procedure: BREAST LUMPECTOMY WITH NEEDLE LOCALIZATION AND AXILLARY SENTINEL LYMPH NODE BX;  Surgeon: Adin Hector, MD;  Location: [place];  Service: General;  Laterality: Right;   Family History  Problem Relation Age of Onset  . Heart disease Mother   . Breast cancer Maternal Aunt     dx in her 73s  . Heart disease Cousin 24    maternal cousin  . Heart attack Sister 65  . Cancer Paternal Aunt     NOS  . Cancer Maternal Aunt     NOS  . Lung cancer Cousin 56    maternal cousin - non smoker  . Brain cancer Cousin 13    maternal cousin's son  . Lung cancer Cousin 8    maternal cousin's son  . Breast cancer Cousin     paternal cousin   History  Substance Use Topics  . Smoking status: Former Smoker -- 1.00 packs/day for 15 years    Types: Cigarettes    Quit date: 07/19/1987  . Smokeless tobacco: Never Used  . Alcohol Use: No   OB History   Grav Para Term Preterm Abortions TAB SAB Ect Mult Living  Review of Systems  Constitutional: Negative for fever.  HENT: Positive for trouble swallowing.   Respiratory: Negative for cough and shortness of breath.   Cardiovascular: Negative for chest pain.  Gastrointestinal: Negative for vomiting and abdominal pain.  Neurological: Negative for headaches.  All other systems reviewed and are negative.    Allergies  Adhesive  Home Medications   Current Outpatient Rx  Name  Route  Sig  Dispense  Refill  . amLODipine (NORVASC) 10 MG tablet   Oral   Take 10 mg by mouth daily.         . Azilsartan-Chlorthalidone 40-25 MG TABS   Oral   Take 1 tablet by mouth daily.          [name] buPROPion (WELLBUTRIN SR) 150 MG 12 hr tablet   Oral   Take 150 mg by mouth daily.          [name] dexamethasone  (DECADRON) 4 MG tablet      Take 2 tablets by mouth once a day on the day after chemotherapy and then take 2 tablets two times a day for 2 days. Take with food.   30 tablet   1   . hydrALAZINE (APRESOLINE) 25 MG tablet   Oral   Take 25 mg by mouth 2 (two) times daily.         [name] HYDROcodone-acetaminophen (NORCO/VICODIN) 5-325 MG per tablet   Oral   Take 1-2 tablets by mouth every 4 (four) hours as needed for pain.   30 tablet   0   . lidocaine-prilocaine (EMLA) cream   Topical   Apply topically as needed.   30 g   0   . LORazepam (ATIVAN) 0.5 MG tablet      Take 1 tablet (0.$RemoveBef'5mg'kvOnxHUMCb$ ) every 6 hours as needed for nausea or vomiting.   30 tablet   0   . losartan (COZAAR) 100 MG tablet   Oral   Take 100 mg by mouth daily.          . metoprolol succinate (TOPROL-XL) 25 MG 24 hr tablet   Oral   Take 25 mg by mouth daily.         . ondansetron (ZOFRAN) 8 MG tablet      Take 1 tablet ($RemoveB'8mg'mLadiVBO$ s) every 12 hours as needed for nausea or vomiting starting the 3rd day after chemotherapy.   30 tablet   1   . pantoprazole (PROTONIX) 40 MG tablet   Oral   Take 40 mg by mouth daily.         . pioglitazone-metformin (ACTOPLUS MET) 15-850 MG per tablet   Oral   Take 1 tablet by mouth 2 (two) times daily with a meal.         . PRESCRIPTION MEDICATION      palonosetron (ALOXI) injection 0.25 mg 0.25 mg  Once 08/02/2013 08/02/2013      Route: Intravenous         . PRESCRIPTION MEDICATION      epirubicin (ELLENCE) chemo injection 200 mg 100 mg/m2  2 m2 (Order-Specific)  Once 08/02/2013         . PRESCRIPTION MEDICATION      fluorouracil (ADRUCIL) chemo injection 1,000 mg 500 mg/m2  2.02 m2 (Treatment Plan Actual)  Once 08/02/2013         . PRESCRIPTION MEDICATION      cyclophosphamide (CYTOXAN) 1,000 mg in sodium chloride 0.9 % 250 mL chemo infusion 500 mg/m2  2 m2 (Order-Specific)  Once  08/02/2013         . prochlorperazine (COMPAZINE) 10 MG tablet   Oral   Take 1  tablet (10 mg total) by mouth every 6 (six) hours as needed (Nausea or vomiting).   30 tablet   1   . rosuvastatin (CRESTOR) 5 MG tablet   Oral   Take 5 mg by mouth daily.         . sitaGLIPtin (JANUVIA) 100 MG tablet   Oral   Take 100 mg by mouth daily.          BP 168/72  Pulse 77  Temp(Src) 98.4 F (36.9 C) (Oral)  Resp 18  SpO2 100% Physical Exam  Nursing note and vitals reviewed. Constitutional: She is oriented to person, place, and time. She appears well-developed and well-nourished. No distress.  HENT:  Head: Normocephalic and atraumatic.  Eyes: EOM are normal. Pupils are equal, round, and reactive to light.  Neck: Normal range of motion. Neck supple.  Cardiovascular: Normal rate and regular rhythm.  Exam reveals no friction rub.   No murmur heard. Pulmonary/Chest: Effort normal and breath sounds normal. No respiratory distress. She has no wheezes. She has no rales.  Abdominal: Soft. She exhibits no distension. There is no tenderness. There is no rebound.  Musculoskeletal: Normal range of motion. She exhibits no edema.  Neurological: She is alert and oriented to person, place, and time. No cranial nerve deficit. She exhibits normal muscle tone.  Skin: No rash noted. She is not diaphoretic.    ED Course  Procedures (including critical care time) Labs Review Labs Reviewed  CBC - Abnormal; Notable for the following:    WBC 17.2 (*)    RBC 2.99 (*)    Hemoglobin 9.2 (*)    HCT 27.8 (*)    RDW 15.8 (*)    All other components within normal limits  BASIC METABOLIC PANEL - Abnormal; Notable for the following:    Glucose, Bld 302 (*)    All other components within normal limits  URINALYSIS, ROUTINE W REFLEX MICROSCOPIC - Abnormal; Notable for the following:    Glucose, UA >1000 (*)    Protein, ur 100 (*)    All other components within normal limits  URINE MICROSCOPIC-ADD ON  COMPREHENSIVE METABOLIC PANEL  CBC WITH DIFFERENTIAL  HEMOGLOBIN A1C   Imaging  Review Dg Chest 2 View  08/06/2013   CLINICAL DATA:  Central chest pain. Difficulty accessing right-sided chest port.  EXAM: CHEST  2 VIEW  COMPARISON:  NM PET IMAGE INITIAL (PI) SKULL BASE TO THIGH dated 04/29/2013; DG CHEST 1V PORT dated 04/22/2013  FINDINGS: The lungs are well-aerated. Pulmonary vascularity is at the upper limits of normal. Minimal bibasilar opacities likely reflect atelectasis. There is no evidence of pleural effusion or pneumothorax.  The heart is normal in size; the mediastinal contour is within normal limits. No acute osseous abnormalities are seen. A right-sided chest port is noted ending within the right atrium. Clips are seen at the left axilla. Clips are noted within the right upper quadrant, reflecting prior cholecystectomy.  IMPRESSION: 1. Right-sided chest port again seen ending within the right atrium. 2. Minimal bibasilar airspace opacities likely reflect atelectasis.   Electronically Signed   By: Garald Balding M.D.   On: 08/06/2013 22:31   Dg Abd 1 View  08/07/2013   CLINICAL DATA:  Mid abdominal pain.  History of breast cancer.  EXAM: ABDOMEN - 1 VIEW  COMPARISON:  NM PET IMAGE INITIAL (PI) SKULL BASE  TO THIGH dated 04/29/2013; CT ABD/PELVIS W CM dated 04/17/2013  FINDINGS: Two supine views. Cholecystectomy clips. Non-obstructive bowel gas pattern. Distal gas and stool. No abnormal abdominal calcifications. No appendicolith.  IMPRESSION: No acute findings.   Electronically Signed   By: Abigail Miyamoto M.D.   On: 08/07/2013 00:02    EKG Interpretation    Date/Time:    Ventricular Rate:    PR Interval:    QRS Duration:   QT Interval:    QTC Calculation:   R Axis:     Text Interpretation:              MDM   1. Odynophagia   2. Dysphagia    32F presents with difficulty swallowing and pain with swallowing. She's on chemotherapy currently. She's had difficulties with odontophagia for the past 4 days and a worsening. Today she had dysphagia and was unable to  swallow whole food. She can swallow without pain without food, however with water in her feet she has a lot of pain. She's had decreased by mouth intake for the past several days. She denies any fever. She recently started chemotherapy for metastatic breast cancer and has completed 6 cycles so far. Here, she has a clear oropharynx. Lungs are clear. Belly is benign. Chest x-ray is normal. She has a white count of 17. Her lites are normal. I spoke with GI Dr. who will see patient in the morning. Hospitalist admitting.     Osvaldo Shipper, MD 08/07/13 [phone]

## 2013-08-07 NOTE — Progress Notes (Signed)
UR completed. Patient changed to inpatient r/t requiring IVF @ 125cc/hr

## 2013-08-07 NOTE — Progress Notes (Signed)
[facility] PROGRESS NOTE  Kathy Maxwell WVP:[id number] DOB: [birth year] DOA: 08/06/2013 PCP: Maximino Greenland, MD  Brief narrative: 58 year old female with past medical history of breast cancer s/p mastectomy of left breast on chemotherapy who presented to [facility] ED 08/06/2013 with pain and difficulty swallowing for past 3 days prior to this admission. Her WBC count was 17.2 on th admission and hemoglobin 9.2, no other lab abnormalities. She underwent EGD 08/06/2013 with findings of esophagitis otherwise unremarkable but brushing biopsy pending.  Assessment/Plan:  Active Problems:   Odynophagia/dysphagia - likely due to esophagitis, possible pill induced esophagitis as seen on EGD - follow up biopsy results - diet as tolerated - per GI, added fluconazole; also on sucralfate, protonix Anemia of chronic disease - hemoglobin 9.2 on admission and this am 7.9 - follow Hgb in am Diabetes - on Lantus 18 units daily per diabetic coordinator recommendations  Hypertension - on metoprolol 5 mg every 12 hours IV Thrombocytopenia - likely due to heparin - d/c heparin and add SCD's bilaterally   Code Status: full code Family Communication: no family at the bedside Disposition Plan: home when stable   Leisa Lenz, MD  [facility] Pager [phone]  If 7PM-7AM, please contact night-coverage [email or web address] Password [email or web address] 08/07/2013, 4:18 PM   LOS: 1 day   Consultants:  GI  Procedures:  EGD 08/07/2013  Antibiotics:  None   HPI/Subjective: Feels better.   Objective: Filed Vitals:   08/07/13 1210 08/07/13 1220 08/07/13 1230 08/07/13 1506  BP: 153/80 153/80 163/86 154/58  Pulse:    83  Temp:    98.7 F (37.1 C)  TempSrc:    Oral  Resp: 14 13 14 18   Height:      Weight:      SpO2: 99% 99% 99% 97%    Intake/Output Summary (Last 24 hours) at 08/07/13 1618 Last data filed at 08/07/13 0500  Gross per 24 hour  Intake    575 ml  Output      0 ml  Net    575 ml     Exam:   General:  Pt is alert, follows commands appropriately, not in acute distress  Cardiovascular: Regular rate and rhythm, S1/S2, no murmurs, no rubs, no gallops  Respiratory: Clear to auscultation bilaterally, no wheezing, no crackles, no rhonchi  Abdomen: Soft, non tender, non distended, bowel sounds present, no guarding  Extremities: No edema, pulses DP and PT palpable bilaterally  Neuro: Grossly nonfocal  Data Reviewed: Basic Metabolic Panel:  Recent Labs Lab 08/02/13 1200 08/06/13 2050 08/07/13 0424  NA 142 137 138  K 3.2* 4.3 3.9  CL  --  98 102  CO2 28 29 28   GLUCOSE 153* 302* 208*  BUN 7.4 17 12   CREATININE 0.8 0.65 0.55  CALCIUM 9.0 9.0 7.7*   Liver Function Tests:  Recent Labs Lab 08/02/13 1200 08/07/13 0424  AST 10 9  ALT 8 7  ALKPHOS 101 124*  BILITOT 0.30 0.3  PROT 6.1* 5.2*  ALBUMIN 3.5 2.9*   No results found for this basename: LIPASE, AMYLASE,  in the last 168 hours No results found for this basename: AMMONIA,  in the last 168 hours CBC:  Recent Labs Lab 08/02/13 1200 08/06/13 2050 08/07/13 0424  WBC 8.8 17.2* 7.3  NEUTROABS 6.5  --  6.5  HGB 9.5* 9.2* 7.9*  HCT 29.2* 27.8* 23.6*  MCV 91.8 93.0 92.5  PLT 176 155 118*   Cardiac Enzymes: No results found for this basename:  CKTOTAL, CKMB, CKMBINDEX, TROPONINI,  in the last 168 hours BNP: No components found with this basename: POCBNP,  CBG:  Recent Labs Lab 08/07/13 0017 08/07/13 0759 08/07/13 1307  GLUCAP 233* 203* 155*    No results found for this or any previous visit (from the past 240 hour(s)).   Studies: Dg Chest 2 View  08/06/2013   CLINICAL DATA:  Central chest pain. Difficulty accessing right-sided chest port.  EXAM: CHEST  2 VIEW  COMPARISON:  NM PET IMAGE INITIAL (PI) SKULL BASE TO THIGH dated 04/29/2013; DG CHEST 1V PORT dated 04/22/2013  FINDINGS: The lungs are well-aerated. Pulmonary vascularity is at the upper limits of normal. Minimal bibasilar  opacities likely reflect atelectasis. There is no evidence of pleural effusion or pneumothorax.  The heart is normal in size; the mediastinal contour is within normal limits. No acute osseous abnormalities are seen. A right-sided chest port is noted ending within the right atrium. Clips are seen at the left axilla. Clips are noted within the right upper quadrant, reflecting prior cholecystectomy.  IMPRESSION: 1. Right-sided chest port again seen ending within the right atrium. 2. Minimal bibasilar airspace opacities likely reflect atelectasis.   Electronically Signed   By: Garald Balding M.D.   On: 08/06/2013 22:31   Dg Abd 1 View  08/07/2013   CLINICAL DATA:  Mid abdominal pain.  History of breast cancer.  EXAM: ABDOMEN - 1 VIEW  COMPARISON:  NM PET IMAGE INITIAL (PI) SKULL BASE TO THIGH dated 04/29/2013; CT ABD/PELVIS W CM dated 04/17/2013  FINDINGS: Two supine views. Cholecystectomy clips. Non-obstructive bowel gas pattern. Distal gas and stool. No abnormal abdominal calcifications. No appendicolith.  IMPRESSION: No acute findings.   Electronically Signed   By: Abigail Miyamoto M.D.   On: 08/07/2013 00:02    Scheduled Meds: . heparin  5,000 Units Subcutaneous Q8H  . insulin aspart  0-15 Units Subcutaneous TID WC  . insulin glargine  18 Units Subcutaneous Daily  . metoprolol  5 mg Intravenous Q12H  . pantoprazole (PROTONIX) IV  40 mg Intravenous Q12H  . sucralfate  1 g Oral TID WC & HS   Continuous Infusions: . sodium chloride 125 mL/hr at 08/07/13 0949  . sodium chloride 500 mL (08/07/13 1058)

## 2013-08-08 ENCOUNTER — Encounter (HOSPITAL_COMMUNITY): Payer: Self-pay | Admitting: Gastroenterology

## 2013-08-08 ENCOUNTER — Ambulatory Visit: Payer: Medicare HMO

## 2013-08-08 ENCOUNTER — Other Ambulatory Visit: Payer: Medicare HMO

## 2013-08-08 LAB — CBC WITH DIFFERENTIAL/PLATELET
Basophils Absolute: 0 10*3/uL (ref 0.0–0.1)
Basophils Relative: 1 % (ref 0–1)
Eosinophils Absolute: 0 10*3/uL (ref 0.0–0.7)
Eosinophils Relative: 1 % (ref 0–5)
HEMATOCRIT: 25.7 % — AB (ref 36.0–46.0)
Hemoglobin: 8.3 g/dL — ABNORMAL LOW (ref 12.0–15.0)
Lymphocytes Relative: 25 % (ref 12–46)
Lymphs Abs: 0.4 10*3/uL — ABNORMAL LOW (ref 0.7–4.0)
MCH: 29.9 pg (ref 26.0–34.0)
MCHC: 32.3 g/dL (ref 30.0–36.0)
MCV: 92.4 fL (ref 78.0–100.0)
Monocytes Absolute: 0 10*3/uL — ABNORMAL LOW (ref 0.1–1.0)
Monocytes Relative: 2 % — ABNORMAL LOW (ref 3–12)
NEUTROS ABS: 1.1 10*3/uL — AB (ref 1.7–7.7)
Neutrophils Relative %: 71 % (ref 43–77)
Platelets: 114 10*3/uL — ABNORMAL LOW (ref 150–400)
RBC: 2.78 MIL/uL — ABNORMAL LOW (ref 3.87–5.11)
RDW: 15.5 % (ref 11.5–15.5)
WBC: 1.5 10*3/uL — ABNORMAL LOW (ref 4.0–10.5)

## 2013-08-08 LAB — COMPREHENSIVE METABOLIC PANEL
ALT: 6 U/L (ref 0–35)
AST: 8 U/L (ref 0–37)
Albumin: 3 g/dL — ABNORMAL LOW (ref 3.5–5.2)
Alkaline Phosphatase: 118 U/L — ABNORMAL HIGH (ref 39–117)
BUN: 8 mg/dL (ref 6–23)
CHLORIDE: 100 meq/L (ref 96–112)
CO2: 28 meq/L (ref 19–32)
Calcium: 8.6 mg/dL (ref 8.4–10.5)
Creatinine, Ser: 0.62 mg/dL (ref 0.50–1.10)
GFR calc Af Amer: >90 mL/min (ref >90)
GLUCOSE: 184 mg/dL — AB (ref 70–99)
POTASSIUM: 4.1 meq/L (ref 3.7–5.3)
SODIUM: 137 meq/L (ref 137–147)
TOTAL PROTEIN: 5.5 g/dL — AB (ref 6.0–8.3)
Total Bilirubin: 0.3 mg/dL (ref 0.3–1.2)

## 2013-08-08 LAB — GLUCOSE, CAPILLARY
GLUCOSE-CAPILLARY: 178 mg/dL — AB (ref 70–99)
Glucose-Capillary: 176 mg/dL — ABNORMAL HIGH (ref 70–99)
Glucose-Capillary: 173 mg/dL — ABNORMAL HIGH (ref 70–99)
Glucose-Capillary: 144 mg/dL — ABNORMAL HIGH (ref 70–99)

## 2013-08-08 MED ORDER — HYDRALAZINE HCL 25 MG PO TABS: 25.0000 mg | ORAL_TABLET | Freq: Two times a day (BID) | ORAL | Status: DC

## 2013-08-08 MED ORDER — PANTOPRAZOLE SODIUM 40 MG PO TBEC
40.0000 mg | DELAYED_RELEASE_TABLET | Freq: Every day | ORAL | Status: DC
  Administered 2013-08-08 – 2013-08-09 (×2): 40 mg via ORAL
  Filled 2013-08-08 (×2): qty 1

## 2013-08-08 MED ORDER — LOSARTAN POTASSIUM 50 MG PO TABS
100.0000 mg | ORAL_TABLET | Freq: Every day | ORAL | Status: DC
  Administered 2013-08-08 – 2013-08-09 (×2): 100 mg via ORAL
  Filled 2013-08-08 (×2): qty 2

## 2013-08-08 MED ORDER — METOPROLOL SUCCINATE ER 25 MG PO TB24
25.0000 mg | ORAL_TABLET | Freq: Every day | ORAL | Status: DC
  Administered 2013-08-08 – 2013-08-09 (×2): 25 mg via ORAL
  Filled 2013-08-08 (×2): qty 1

## 2013-08-08 MED ORDER — AZILSARTAN-CHLORTHALIDONE 40-25 MG PO TABS: 1.0000 | ORAL_TABLET | Freq: Every day | ORAL | Status: DC

## 2013-08-08 MED ORDER — ATORVASTATIN CALCIUM 10 MG PO TABS
10.0000 mg | ORAL_TABLET | Freq: Every day | ORAL | Status: DC
  Administered 2013-08-08: 10 mg via ORAL
  Filled 2013-08-08 (×2): qty 1

## 2013-08-08 MED ORDER — METFORMIN HCL 850 MG PO TABS
850.0000 mg | ORAL_TABLET | Freq: Two times a day (BID) | ORAL | Status: DC
  Administered 2013-08-08 – 2013-08-09 (×2): 850 mg via ORAL
  Filled 2013-08-08 (×4): qty 1

## 2013-08-08 MED ORDER — PIOGLITAZONE HCL-METFORMIN HCL 15-850 MG PO TABS: 1.0000 | ORAL_TABLET | Freq: Two times a day (BID) | ORAL | Status: DC

## 2013-08-08 MED ORDER — LINAGLIPTIN 5 MG PO TABS
5.0000 mg | ORAL_TABLET | Freq: Every day | ORAL | Status: DC
  Administered 2013-08-08 – 2013-08-09 (×2): 5 mg via ORAL
  Filled 2013-08-08 (×2): qty 1

## 2013-08-08 MED ORDER — BUPROPION HCL ER (SR) 150 MG PO TB12
150.0000 mg | ORAL_TABLET | Freq: Every day | ORAL | Status: DC
  Administered 2013-08-08 – 2013-08-09 (×2): 150 mg via ORAL
  Filled 2013-08-08 (×2): qty 1

## 2013-08-08 MED ORDER — ACETAMINOPHEN 325 MG PO TABS
650.0000 mg | ORAL_TABLET | Freq: Four times a day (QID) | ORAL | Status: DC | PRN
  Administered 2013-08-08 (×2): 650 mg via ORAL
  Filled 2013-08-08 (×2): qty 2

## 2013-08-08 MED ORDER — AMLODIPINE BESYLATE 10 MG PO TABS
10.0000 mg | ORAL_TABLET | Freq: Every day | ORAL | Status: DC
  Administered 2013-08-08 – 2013-08-09 (×2): 10 mg via ORAL
  Filled 2013-08-08 (×2): qty 1

## 2013-08-08 MED ORDER — PIOGLITAZONE HCL 15 MG PO TABS
15.0000 mg | ORAL_TABLET | Freq: Two times a day (BID) | ORAL | Status: DC
  Administered 2013-08-08 – 2013-08-09 (×2): 15 mg via ORAL
  Filled 2013-08-08 (×4): qty 1

## 2013-08-08 MED ORDER — LORAZEPAM 0.5 MG PO TABS: 0.5000 mg | ORAL_TABLET | Freq: Four times a day (QID) | ORAL | Status: DC | PRN

## 2013-08-08 NOTE — Progress Notes (Signed)
[facility] Progress Note  Subjective: Dysphagia somewhat improved, tolerating full liquid  Objective: Vital signs in last 24 hours: Temp:  [97.5 F (36.4 C)-98.7 F (37.1 C)] 97.5 F (36.4 C) (01/22 0518) Pulse Rate:  [77-99] 90 (01/22 0518) Resp:  [13-18] 16 (01/21 2200) BP: (151-198)/(58-122) 167/62 mmHg (01/22 0518) SpO2:  [96 %-100 %] 98 % (01/22 0518) Weight change:    PE: Alert and oriented, otherwise unchanged  Lab Results: Results for orders placed during the hospital encounter of 08/06/13 (from the past 24 hour(s))  GLUCOSE, CAPILLARY     Status: Abnormal   Collection Time    08/07/13  1:07 PM      Result Value Range   Glucose-Capillary 155 (*) 70 - 99 mg/dL  GLUCOSE, CAPILLARY     Status: Abnormal   Collection Time    08/07/13  4:46 PM      Result Value Range   Glucose-Capillary 209 (*) 70 - 99 mg/dL  GLUCOSE, CAPILLARY     Status: Abnormal   Collection Time    08/07/13 10:10 PM      Result Value Range   Glucose-Capillary 184 (*) 70 - 99 mg/dL   Comment 1 Notify RN    COMPREHENSIVE METABOLIC PANEL     Status: Abnormal   Collection Time    08/08/13  6:32 AM      Result Value Range   Sodium 137  137 - 147 mEq/L   Potassium 4.1  3.7 - 5.3 mEq/L   Chloride 100  96 - 112 mEq/L   CO2 28  19 - 32 mEq/L   Glucose, Bld 184 (*) 70 - 99 mg/dL   BUN 8  6 - 23 mg/dL   Creatinine, Ser 0.62  0.50 - 1.10 mg/dL   Calcium 8.6  8.4 - 10.5 mg/dL   Total Protein 5.5 (*) 6.0 - 8.3 g/dL   Albumin 3.0 (*) 3.5 - 5.2 g/dL   AST 8  0 - 37 U/L   ALT 6  0 - 35 U/L   Alkaline Phosphatase 118 (*) 39 - 117 U/L   Total Bilirubin 0.3  0.3 - 1.2 mg/dL   GFR calc non Af Amer >90  >90 mL/min   GFR calc Af Amer >90  >90 mL/min  CBC WITH DIFFERENTIAL     Status: Abnormal   Collection Time    08/08/13  6:32 AM      Result Value Range   WBC 1.5 (*) 4.0 - 10.5 K/uL   RBC 2.78 (*) 3.87 - 5.11 MIL/uL   Hemoglobin 8.3 (*) 12.0 - 15.0 g/dL   HCT 25.7 (*) 36.0 - 46.0 %   MCV  92.4  78.0 - 100.0 fL   MCH 29.9  26.0 - 34.0 pg   MCHC 32.3  30.0 - 36.0 g/dL   RDW 15.5  11.5 - 15.5 %   Platelets 114 (*) 150 - 400 K/uL   Neutrophils Relative % 71  43 - 77 %   Lymphocytes Relative 25  12 - 46 %   Monocytes Relative 2 (*) 3 - 12 %   Eosinophils Relative 1  0 - 5 %   Basophils Relative 1  0 - 1 %   Neutro Abs 1.1 (*) 1.7 - 7.7 K/uL   Lymphs Abs 0.4 (*) 0.7 - 4.0 K/uL   Monocytes Absolute 0.0 (*) 0.1 - 1.0 K/uL   Eosinophils Absolute 0.0  0.0 - 0.7 K/uL   Basophils Absolute 0.0  0.0 -  0.1 K/uL   WBC Morphology DOHLE BODIES    GLUCOSE, CAPILLARY     Status: Abnormal   Collection Time    08/08/13  7:34 AM      Result Value Range   Glucose-Capillary 176 (*) 70 - 99 mg/dL    Studies/Results: Dg Chest 2 View  08/06/2013   CLINICAL DATA:  Central chest pain. Difficulty accessing right-sided chest port.  EXAM: CHEST  2 VIEW  COMPARISON:  NM PET IMAGE INITIAL (PI) SKULL BASE TO THIGH dated 04/29/2013; DG CHEST 1V PORT dated 04/22/2013  FINDINGS: The lungs are well-aerated. Pulmonary vascularity is at the upper limits of normal. Minimal bibasilar opacities likely reflect atelectasis. There is no evidence of pleural effusion or pneumothorax.  The heart is normal in size; the mediastinal contour is within normal limits. No acute osseous abnormalities are seen. A right-sided chest port is noted ending within the right atrium. Clips are seen at the left axilla. Clips are noted within the right upper quadrant, reflecting prior cholecystectomy.  IMPRESSION: 1. Right-sided chest port again seen ending within the right atrium. 2. Minimal bibasilar airspace opacities likely reflect atelectasis.   Electronically Signed   By: Garald Balding M.D.   On: 08/06/2013 22:31   Dg Abd 1 View  08/07/2013   CLINICAL DATA:  Mid abdominal pain.  History of breast cancer.  EXAM: ABDOMEN - 1 VIEW  COMPARISON:  NM PET IMAGE INITIAL (PI) SKULL BASE TO THIGH dated 04/29/2013; CT ABD/PELVIS W CM dated  04/17/2013  FINDINGS: Two supine views. Cholecystectomy clips. Non-obstructive bowel gas pattern. Distal gas and stool. No abnormal abdominal calcifications. No appendicolith.  IMPRESSION: No acute findings.   Electronically Signed   By: Abigail Miyamoto M.D.   On: 08/07/2013 00:02      Assessment: Odynophagia with nonspecific appearance of esophagitis with erosions on EGD  Plan: 1. Continue Diflucan 2. Await biopsy and brushing results 3. Continue Carafate and a proton pump inhibitor 4. Advance diet slowly as tolerated    [place] Knee C 08/08/2013, 8:56 AM

## 2013-08-08 NOTE — Progress Notes (Signed)
[facility] PROGRESS NOTE  Kathy Maxwell IDP:[id number] DOB: [birth year] DOA: 08/06/2013 PCP: Maximino Greenland, MD  Brief narrative: 58 year old female with past medical history of breast cancer s/p left mastectomy on chemotherapy who presented to [facility] ED 08/06/2013 with pain and difficulty swallowing for past 3 days prior to this admission. Her WBC count was 17.2 on th admission and hemoglobin 9.2, no other lab abnormalities. She underwent EGD 08/06/2013 with findings of esophagitis otherwise unremarkable but brushing biopsy pending.   Assessment/Plan:   Active Problems:  Odynophagia/dysphagia  - likely due to esophagitis, possible pill induced esophagitis as seen on EGD  - follow up biopsy results - still pending - diet as tolerated; was soft emch diet but today will advance to regular  - continue  fluconazole; also on sucralfate, protonix  Anemia of chronic disease  - hemoglobin 9.2 on admission and then 7.9 on 1/21/2015l; this am 8.3 with no transfusion Diabetes  - on Lantus 18 units daily per diabetic coordinator recommendations  Hypertension  - on metoprolol 5 mg every 12 hours IV but BP high this am at 187/64; restart home Norvasc, losartan and metoprolol  Thrombocytopenia  - likely due to heparin  - d/c'ed heparin and added SCD's bilaterally  - Platelet count improving  Code Status: full code  Family Communication: no family at the bedside  Disposition Plan: home when stable    Consultants:  GI Procedures:  EGD 08/07/2013 Antibiotics:  None   Leisa Lenz, MD  [facility] Pager [phone]  If 7PM-7AM, please contact night-coverage [email or web address] Password [facility] 08/08/2013, 12:07 PM   LOS: 2 days    HPI/Subjective: No acute overnight events.   Objective: Filed Vitals:   08/07/13 1506 08/07/13 2200 08/08/13 0518 08/08/13 1000  BP: 154/58 151/65 167/62 187/64  Pulse: 83 77 90   Temp: 98.7 F (37.1 C) 97.9 F (36.6 C) 97.5 F (36.4 C)   TempSrc: Oral  Oral Oral   Resp: 18 16    Height:      Weight:      SpO2: 97% 98% 98%     Intake/Output Summary (Last 24 hours) at 08/08/13 1207 Last data filed at 08/08/13 0900  Gross per 24 hour  Intake   4685 ml  Output   2150 ml  Net   2535 ml    Exam:  General: Pt is alert, follows commands appropriately, not in acute distress  Cardiovascular: Regular rate and rhythm, S1/S2, no murmurs, no rubs, no gallops  Respiratory: Clear to auscultation bilaterally, no wheezing, no crackles, no rhonchi  Abdomen: Soft, non tender, non distended, bowel sounds present, no guarding  Extremities: No edema, pulses DP and PT palpable bilaterally  Neuro: Grossly nonfocal    Data Reviewed: Basic Metabolic Panel:  Recent Labs Lab 08/02/13 1200 08/06/13 2050 08/07/13 0424 08/08/13 0632  NA 142 137 138 137  K 3.2* 4.3 3.9 4.1  CL  --  98 102 100  CO2 28 29 28 28   GLUCOSE 153* 302* 208* 184*  BUN 7.4 17 12 8   CREATININE 0.8 0.65 0.55 0.62  CALCIUM 9.0 9.0 7.7* 8.6   Liver Function Tests:  Recent Labs Lab 08/07/13 0424 08/08/13 0632  AST 9 8  ALT 7 6  ALKPHOS 124* 118*  BILITOT 0.3 0.3  PROT 5.2* 5.5*  ALBUMIN 2.9* 3.0*   No results found for this basename: LIPASE, AMYLASE,  in the last 168 hours No results found for this basename: AMMONIA,  in the last  168 hours CBC:  Recent Labs Lab 08/02/13 1200 08/06/13 2050 08/07/13 0424 08/08/13 0632  WBC 8.8 17.2* 7.3 1.5*  NEUTROABS 6.5  --  6.5 1.1*  HGB 9.5* 9.2* 7.9* 8.3*  HCT 29.2* 27.8* 23.6* 25.7*  MCV 91.8 93.0 92.5 92.4  PLT 176 155 118* 114*   Cardiac Enzymes: No results found for this basename: CKTOTAL, CKMB, CKMBINDEX, TROPONINI,  in the last 168 hours BNP: No components found with this basename: POCBNP,  CBG:  Recent Labs Lab 08/07/13 0759 08/07/13 1307 08/07/13 1646 08/07/13 2210 08/08/13 0734  GLUCAP 203* 155* 209* 184* 176*    No results found for this or any previous visit (from the past 240 hour(s)).    Studies: Dg Chest 2 View  08/06/2013   CLINICAL DATA:  Central chest pain. Difficulty accessing right-sided chest port.  EXAM: CHEST  2 VIEW  COMPARISON:  NM PET IMAGE INITIAL (PI) SKULL BASE TO THIGH dated 04/29/2013; DG CHEST 1V PORT dated 04/22/2013  FINDINGS: The lungs are well-aerated. Pulmonary vascularity is at the upper limits of normal. Minimal bibasilar opacities likely reflect atelectasis. There is no evidence of pleural effusion or pneumothorax.  The heart is normal in size; the mediastinal contour is within normal limits. No acute osseous abnormalities are seen. A right-sided chest port is noted ending within the right atrium. Clips are seen at the left axilla. Clips are noted within the right upper quadrant, reflecting prior cholecystectomy.  IMPRESSION: 1. Right-sided chest port again seen ending within the right atrium. 2. Minimal bibasilar airspace opacities likely reflect atelectasis.   Electronically Signed   By: Garald Balding M.D.   On: 08/06/2013 22:31   Dg Abd 1 View  08/07/2013   CLINICAL DATA:  Mid abdominal pain.  History of breast cancer.  EXAM: ABDOMEN - 1 VIEW  COMPARISON:  NM PET IMAGE INITIAL (PI) SKULL BASE TO THIGH dated 04/29/2013; CT ABD/PELVIS W CM dated 04/17/2013  FINDINGS: Two supine views. Cholecystectomy clips. Non-obstructive bowel gas pattern. Distal gas and stool. No abnormal abdominal calcifications. No appendicolith.  IMPRESSION: No acute findings.   Electronically Signed   By: Abigail Miyamoto M.D.   On: 08/07/2013 00:02    Scheduled Meds: . amLODipine  10 mg Oral Daily  . atorvastatin  10 mg Oral q1800  . buPROPion  150 mg Oral Daily  . insulin aspart  0-15 Units Subcutaneous TID WC  . insulin glargine  18 Units Subcutaneous QHS  . linagliptin  5 mg Oral Daily  . losartan  100 mg Oral Daily  . metoprolol succinate  25 mg Oral Daily  . pantoprazole  40 mg Oral Daily  . pantoprazole (PROTONIX) IV  40 mg Intravenous Q12H  . pioglitazone-metformin  1  tablet Oral BID WC  . sucralfate  1 g Oral TID WC & HS   Continuous Infusions: . sodium chloride 125 mL/hr at 08/08/13 0618  . sodium chloride 500 mL (08/07/13 1058)

## 2013-08-09 ENCOUNTER — Other Ambulatory Visit: Payer: Self-pay | Admitting: Oncology

## 2013-08-09 ENCOUNTER — Other Ambulatory Visit: Payer: Medicare HMO

## 2013-08-09 ENCOUNTER — Ambulatory Visit: Payer: Medicare HMO

## 2013-08-09 LAB — GLUCOSE, CAPILLARY
Glucose-Capillary: 124 mg/dL — ABNORMAL HIGH (ref 70–99)
Glucose-Capillary: 119 mg/dL — ABNORMAL HIGH (ref 70–99)

## 2013-08-09 LAB — CBC WITH DIFFERENTIAL/PLATELET
BASOS PCT: 1 % (ref 0–1)
Basophils Absolute: 0 10*3/uL (ref 0.0–0.1)
EOS PCT: 7 % — AB (ref 0–5)
Eosinophils Absolute: 0.1 10*3/uL (ref 0.0–0.7)
HCT: 24.7 % — ABNORMAL LOW (ref 36.0–46.0)
HEMOGLOBIN: 8.4 g/dL — AB (ref 12.0–15.0)
LYMPHS PCT: 40 % (ref 12–46)
Lymphs Abs: 0.4 10*3/uL — ABNORMAL LOW (ref 0.7–4.0)
MCH: 31 pg (ref 26.0–34.0)
MCHC: 34 g/dL (ref 30.0–36.0)
MCV: 91.1 fL (ref 78.0–100.0)
Monocytes Absolute: 0 10*3/uL — ABNORMAL LOW (ref 0.1–1.0)
Monocytes Relative: 4 % (ref 3–12)
NEUTROS PCT: 48 % (ref 43–77)
Neutro Abs: 0.5 10*3/uL — ABNORMAL LOW (ref 1.7–7.7)
Platelets: 110 10*3/uL — ABNORMAL LOW (ref 150–400)
RBC: 2.71 MIL/uL — AB (ref 3.87–5.11)
RDW: 15.4 % (ref 11.5–15.5)
WBC: 1 10*3/uL — AB (ref 4.0–10.5)

## 2013-08-09 LAB — COMPREHENSIVE METABOLIC PANEL
ALT: 5 U/L (ref 0–35)
AST: 8 U/L (ref 0–37)
Albumin: 2.9 g/dL — ABNORMAL LOW (ref 3.5–5.2)
Alkaline Phosphatase: 103 U/L (ref 39–117)
BILIRUBIN TOTAL: 0.2 mg/dL — AB (ref 0.3–1.2)
BUN: 8 mg/dL (ref 6–23)
CALCIUM: 8.4 mg/dL (ref 8.4–10.5)
CHLORIDE: 100 meq/L (ref 96–112)
CO2: 25 meq/L (ref 19–32)
CREATININE: 0.69 mg/dL (ref 0.50–1.10)
GLUCOSE: 158 mg/dL — AB (ref 70–99)
Potassium: 3.8 mEq/L (ref 3.7–5.3)
Sodium: 136 mEq/L — ABNORMAL LOW (ref 137–147)
Total Protein: 5.5 g/dL — ABNORMAL LOW (ref 6.0–8.3)

## 2013-08-09 MED ORDER — SUCRALFATE 1 GM/10ML PO SUSP: 1.0000 g | Freq: Three times a day (TID) | ORAL | Status: DC

## 2013-08-09 MED ORDER — HEPARIN SOD (PORK) LOCK FLUSH 100 UNIT/ML IV SOLN
500.0000 [IU] | INTRAVENOUS | Status: DC | PRN
  Filled 2013-08-09: qty 5

## 2013-08-09 MED ORDER — PANTOPRAZOLE SODIUM 40 MG PO TBEC: 40.0000 mg | DELAYED_RELEASE_TABLET | Freq: Every day | ORAL | Status: DC

## 2013-08-09 MED ORDER — FLUCONAZOLE 10 MG/ML PO SUSR: 100.0000 mg | Freq: Every day | ORAL | Status: DC

## 2013-08-09 MED ORDER — CIPROFLOXACIN 500 MG/5ML (10%) PO SUSR: 500.0000 mg | Freq: Two times a day (BID) | ORAL | Status: DC

## 2013-08-09 MED ORDER — HYDROCODONE-ACETAMINOPHEN 5-325 MG PO TABS: 1.0000 | ORAL_TABLET | ORAL | Status: DC | PRN

## 2013-08-09 NOTE — Discharge Summary (Signed)
Physician Discharge Summary  Kathy Maxwell RXV:[id number] DOB: 10/25/1955 DOA: 08/06/2013  PCP: Maximino Greenland, MD  Admit date: 08/06/2013 Discharge date: 08/09/2013  Recommendations for Outpatient Follow-up:  1. Please note that we have provided a prescription for fluconazole 100 mg once a day for 10 days on discharge. Continue taking Protonix 40 mg daily and sucralfate as prescribed 2. Spoke with Dr. Humphrey Rolls who also recommended taking Cipro 500 mg twice daily considering neutropenia. 3. your white blood cell count was 1 prior to discharge. You did receive Neulasta support about one week prior to this discharge 4. Per GI, if pain/difficulty swallowing process EGD may be required again in near future. Please followup with GI, phone number to make an appointment was provided  Discharge Diagnoses:  Active Problems:   Odynophagia    Discharge Condition: medically stable for discharge home today  Diet recommendation: as tolerated   History of present illness:  58 year old female with past medical history of breast cancer s/p left mastectomy on chemotherapy who presented to [facility] ED 08/06/2013 with pain and difficulty swallowing for past 3 days prior to this admission. Her WBC count was 17.2 on th admission and hemoglobin 9.2, no other lab abnormalities. She underwent EGD 08/06/2013 with findings of esophagitis otherwise unremarkable but brushing biopsy - no malignant cells identified.  Assessment/Plan:   Active Problems:  Odynophagia/dysphagia  - likely due to esophagitis, possible pill induced esophagitis as seen on EGD  - brushings biopsy results - no malignant cell - diet as tolerated - continue fluconazole; also on sucralfate, protonix - as prescribed above Anemia of chronic disease  - hemoglobin 9.2 on admission and then 7.9 on 1/21/2015l - hemoglobin 8.3 at the time of the discharge  - did not receive blood transfusions while in hospital  Diabetes  - on Lantus 18 units daily per  diabetic coordinator recommendations  Hypertension  - on metoprolol 5 mg every 12 hours IV but BP high this am at 187/64; restarted home Norvasc, losartan and metoprolol  Thrombocytopenia  - likely due to heparin  - d/c'ed heparin and added SCD's bilaterally  - Platelet count improving, 110 prior to discharge   Code Status: full code  Family Communication: no family at the bedside  Disposition Plan: home today  Consultants:  GI Procedures:  EGD 08/07/2013 Antibiotics:  None   Signed:  Leisa Lenz, MD  [facility] 08/09/2013, 12:43 PM  Pager #: [phone]   Discharge Exam: Filed Vitals:   08/09/13 0532  BP: 166/58  Pulse:   Temp:   Resp:    Filed Vitals:   08/08/13 1400 08/08/13 2146 08/09/13 0444 08/09/13 0532  BP: 169/66 155/61 179/71 166/58  Pulse: 83 88 82   Temp: 98.3 F (36.8 C) 97.9 F (36.6 C) 98.1 F (36.7 C)   TempSrc: Oral Oral Oral   Resp: _0 Height:      Weight:      SpO2: 99% 95% 98%     General: Pt is alert, follows commands appropriately, not in acute distress Cardiovascular: Regular rate and rhythm, S1/S2 +, no murmurs, no rubs, no gallops Respiratory: Clear to auscultation bilaterally, no wheezing, no crackles, no rhonchi Abdominal: Soft, non tender, non distended, bowel sounds +, no guarding Extremities: no edema, no cyanosis, pulses palpable bilaterally DP and PT Neuro: Grossly nonfocal  Discharge Instructions  Discharge Orders   Future Appointments Provider Department Dept Phone   08/16/2013 8:30 AM [facility] [place]  [facility] [phone]   08/16/2013 9:00 AM [name], [facility] [phone]   08/16/2013 10:00 AM [facility] [phone]   08/23/2013 8:30 AM [facility] [phone]   08/23/2013 9:00 AM [facility] Covering Provider [facility] [phone]   08/23/2013 10:00 AM [facility] [phone]   08/30/2013 8:45 AM [facility] [facility] [phone]   08/30/2013 9:15 AM Minette Headland, NP [facility] [phone]   08/30/2013 10:15 AM [facility] [phone]   09/06/2013 8:45 AM [facility] [phone]   09/06/2013 9:15 AM Minette Headland, NP [facility] [phone]   09/06/2013 10:15 AM [facility] [phone]   09/13/2013 8:45 AM [facility] [phone]   09/13/2013 9:15 AM Minette Headland, NP [facility] [phone]   09/13/2013 10:15 AM [facility] [phone]   09/20/2013 9:15 AM [facility] [phone]   09/20/2013 9:45 AM Minette Headland, NP [facility] [phone]   09/20/2013 10:45 AM [facility] [phone]   09/27/2013 8:45 AM [facility] [phone]   09/27/2013 9:15 AM Minette Headland, NP [facility] [phone]   09/27/2013 10:15 AM [facility] [phone]   10/04/2013 8:45 AM [facility] 2 [facility] [facility] [phone]   10/04/2013 9:15 AM Minette Headland, NP [facility] [phone]   10/04/2013 10:15 AM [facility] [phone]   10/11/2013 8:45 AM [facility] [phone]    10/11/2013 9:15 AM Minette Headland, NP [facility] [phone]   10/11/2013 10:15 AM [facility] [facility] [phone]   10/18/2013 8:45 AM [facility] [phone]   10/18/2013 9:15 AM Minette Headland, NP [facility] [phone]   10/18/2013 10:15 AM [facility] [phone]   10/25/2013 8:45 AM [facility] [phone]   10/25/2013 9:15 AM Minette Headland, NP [facility] [phone]   10/25/2013 10:15 AM [facility] [phone]   11/01/2013 8:45 AM [facility] [phone]   11/01/2013 9:15 AM Minette Headland, NP [facility] [phone]   11/01/2013 10:15 AM [facility] A3 [facility] [phone]   Future Orders Complete By Expires   Call MD for:  difficulty breathing, headache or visual disturbances  As directed    Call MD for:  persistant dizziness or light-headedness  As directed    Call MD for:  persistant nausea and vomiting  As directed    Call MD for:  severe uncontrolled pain  As directed    Diet - low sodium  heart healthy  As directed    Discharge instructions  As directed    Comments:     1. Please note that we have provided a prescription for fluconazole 100 mg once a day for 10 days on discharge. Continue taking Protonix 40 mg daily and sucralfate as prescribed 2. Spoke with Dr. Humphrey Rolls who also recommended taking Cipro 500 mg twice daily considering neutropenia. 3. your white blood cell count was 1 prior to discharge. You did receive Neulasta support about one week prior to this discharge 4. Per GI, if pain/difficulty swallowing process EGD may be  required again in near future. Please followup with GI, phone number to make an appointment was provided   Increase activity slowly  As directed        Medication List         amLODipine 10 MG tablet  Commonly known as:  NORVASC  Take 10 mg by mouth daily.     Azilsartan-Chlorthalidone 40-25 MG Tabs  Take 1 tablet by mouth daily.     buPROPion 150 MG 12 hr tablet  Commonly known as:  WELLBUTRIN SR  Take 150 mg by mouth daily.     ciprofloxacin 500 MG/5ML (10%) suspension  Commonly known as:  CIPRO  Take 5 mLs (500 mg total) by mouth 2 (two) times daily.     dexamethasone 4 MG tablet  Commonly known as:  DECADRON  Take 2 tablets by mouth once a day on the day after chemotherapy and then take 2 tablets two times a day for 2 days. Take with food.     fluconazole 10 MG/ML suspension  Commonly known as:  DIFLUCAN  Take 10 mLs (100 mg total) by mouth daily.     hydrALAZINE 25 MG tablet  Commonly known as:  APRESOLINE  Take 25 mg by mouth 2 (two) times daily.     HYDROcodone-acetaminophen 5-325 MG per tablet  Commonly known as:  NORCO/VICODIN  Take 1-2 tablets by mouth every 4 (four) hours as needed.     JANUVIA 100 MG tablet  Generic drug:  sitaGLIPtin  Take 100 mg by mouth daily.     lidocaine-prilocaine cream  Commonly known as:  EMLA  Apply topically as needed.     LORazepam 0.5 MG tablet  Commonly known as:  ATIVAN  Take 1 tablet (0.52m) every 6 hours as needed for nausea or vomiting.     losartan 100 MG tablet  Commonly known as:  COZAAR  Take 100 mg by mouth daily.     metoprolol succinate 25 MG 24 hr tablet  Commonly known as:  TOPROL-XL  Take 25 mg by mouth daily.     ondansetron 8 MG tablet  Commonly known as:  ZOFRAN  Take 1 tablet (835m) every 12 hours as needed for nausea or vomiting starting the 3rd day after chemotherapy.     pantoprazole 40 MG tablet  Commonly known as:  PROTONIX  Take 1 tablet (40 mg total) by mouth daily.      pioglitazone-metformin 15-850 MG per tablet  Commonly known as:  ACTOPLUS MET  Take 1 tablet by mouth 2 (two) times daily with a meal.     PRESCRIPTION MEDICATION  - palonosetron (ALOXI) injection 0.25 mg 0.25 mg  Once 08/02/2013 08/02/2013     -   Route: Intravenous     PRESCRIPTION MEDICATION  epirubicin (ELLENCE) chemo injection 200 mg 100 mg/m2  2 m2 (Order-Specific)  Once 08/02/2013     PRESCRIPTION MEDICATION  fluorouracil (ADRUCIL) chemo injection 1,000 mg 500 mg/m2  2.02 m2 (Treatment Plan Actual)  Once 08/02/2013     PRESCRIPTION MEDICATION  cyclophosphamide (CYTOXAN) 1,000 mg in sodium chloride 0.9 % 250 mL chemo infusion 500 mg/m2  2 m2 (Order-Specific)  Once 08/02/2013     prochlorperazine 10 MG tablet  Commonly known as:  COMPAZINE  Take 1 tablet (10 mg total) by mouth every 6 (six) hours as needed (Nausea or vomiting).     rosuvastatin 5 MG tablet  Commonly known as:  CRESTOR  Take 5 mg by mouth daily.     sucralfate 1 GM/10ML suspension  Commonly known as:  CARAFATE  Take 10 mLs (1 g total) by mouth 4 (four) times daily -  with meals and at bedtime.           Follow-up Information   Schedule an appointment as soon as possible for a visit with Maximino Greenland, MD.   Specialty:  Internal Medicine   Contact information:   [address] [place] [address] [phone]       Follow up with Marcy Panning, MD On 08/16/2013. (at 8:30 am)    Specialty:  Oncology   Contact information:   [place] [address] [phone]       Follow up with HAYES,JOHN C, MD. Schedule an appointment as soon as possible for a visit in 2 weeks.   Specialty:  Gastroenterology   Contact information:   [address], [place] [address] [phone]        The results of significant diagnostics from this hospitalization (including imaging, microbiology, ancillary and laboratory) are listed below for reference.     Significant Diagnostic Studies: Dg Chest 2 View  08/06/2013   CLINICAL DATA:  Central chest pain. Difficulty accessing right-sided chest port.  EXAM: CHEST  2 VIEW  COMPARISON:  NM PET IMAGE INITIAL (PI) SKULL BASE TO THIGH dated 04/29/2013; DG CHEST 1V PORT dated 04/22/2013  FINDINGS: The lungs are well-aerated. Pulmonary vascularity is at the upper limits of normal. Minimal bibasilar opacities likely reflect atelectasis. There is no evidence of pleural effusion or pneumothorax.  The heart is normal in size; the mediastinal contour is within normal limits. No acute osseous abnormalities are seen. A right-sided chest port is noted ending within the right atrium. Clips are seen at the left axilla. Clips are noted within the right upper quadrant, reflecting prior cholecystectomy.  IMPRESSION: 1. Right-sided chest port again seen ending within the right atrium. 2. Minimal bibasilar airspace opacities likely reflect atelectasis.   Electronically Signed   By: Garald Balding M.D.   On: 08/06/2013 22:31   Dg Abd 1 View  08/07/2013   CLINICAL DATA:  Mid abdominal pain.  History of breast cancer.  EXAM: ABDOMEN - 1 VIEW  COMPARISON:  NM PET IMAGE INITIAL (PI) SKULL BASE TO THIGH dated 04/29/2013; CT ABD/PELVIS W CM dated 04/17/2013  FINDINGS: Two supine views. Cholecystectomy clips. Non-obstructive bowel gas pattern. Distal gas and stool. No abnormal abdominal calcifications. No appendicolith.  IMPRESSION: No acute findings.   Electronically Signed   By: Abigail Miyamoto M.D.   On: 08/07/2013 00:02    Microbiology: No results found for this or any previous visit (from the past 240 hour(s)).   Labs: Basic Metabolic Panel:  Recent Labs Lab 08/06/13 2050 08/07/13 0424 08/08/13 0632 08/09/13 0437  NA 137 138 137 136*  K 4.3 3.9 4.1 3.8  CL 98 102  100 100  CO2 _0 GLUCOSE 302* 208* 184* 158*  BUN _1 CREATININE 0.65 0.55 0.62 0.69  CALCIUM 9.0 7.7* 8.6 8.4   Liver Function  Tests:  Recent Labs Lab 08/07/13 0424 08/08/13 0632 08/09/13 0437  AST _2 ALT _3 ALKPHOS 124* 118* 103  BILITOT 0.3 0.3 0.2*  PROT 5.2* 5.5* 5.5*  ALBUMIN 2.9* 3.0* 2.9*   No results found for this basename: LIPASE, AMYLASE,  in the last 168 hours No results found for this basename: AMMONIA,  in the last 168 hours CBC:  Recent Labs Lab 08/06/13 2050 08/07/13 0424 08/08/13 0632 08/09/13 0437  WBC 17.2* 7.3 1.5* 1.0*  NEUTROABS  --  6.5 1.1* 0.5*  HGB 9.2* 7.9* 8.3* 8.4*  HCT 27.8* 23.6* 25.7* 24.7*  MCV 93.0 92.5 92.4 91.1  PLT 155 118* 114* 110*   Cardiac Enzymes: No results found for this basename: CKTOTAL, CKMB, CKMBINDEX, TROPONINI,  in the last 168 hours BNP: BNP (last 3 results) No results found for this basename: PROBNP,  in the last 8760 hours CBG:  Recent Labs Lab 08/08/13 1204 08/08/13 1658 08/08/13 2144 08/09/13 0759 08/09/13 1204  GLUCAP 173* 144* 178* 124* 119*    Time coordinating discharge: Over 30 minutes

## 2013-08-09 NOTE — Discharge Instructions (Signed)
Dysphagia  Swallowing problems (dysphagia) occur when solids and liquids seem to stick in your throat on the way down to your stomach, or the food takes longer to get to the stomach. Other symptoms include regurgitating food, noises coming from the throat, chest discomfort with swallowing, and a feeling of fullness or the feeling of something being stuck in your throat when swallowing. When blockage in your throat is complete it may be associated with drooling.  CAUSES   Problems with swallowing may occur because of problems with the muscles. The food cannot be propelled in the usual manner into your stomach. You may have ulcers, scar tissue, or inflammation in the tube down which food travels from your mouth to your stomach (esophagus), which blocks food from passing normally into the stomach. Causes of inflammation include:  · Acid reflux from your stomach into your esophagus.  · Infection.  · Radiation treatment for cancer.  · Medicines taken without enough fluids to wash them down into your stomach.  You may have nerve problems that prevent signals from being sent to the muscles of your esophagus to contract and move your food down to your stomach. Globus pharyngeus is a relatively common problem in which there is a sense of an obstruction or difficulty in swallowing, without any physical abnormalities of the swallowing passages being found. This problem usually improves over time with reassurance and testing to rule out other causes.  DIAGNOSIS  Dysphagia can be diagnosed and its cause can be determined by tests in which you swallow a white substance that helps illuminate the inside of your throat (contrast medium) while X-rays are taken. Sometimes a flexible telescope that is inserted down your throat (endoscopy) to look at your esophagus and stomach is used.  TREATMENT   · If the dysphagia is caused by acid reflux or infection, medicines may be used.  · If the dysphagia is caused by problems with your  swallowing muscles, swallowing therapy may be used to help you strengthen your swallowing muscles.  · If the dysphagia is caused by a blockage or mass, procedures to remove the blockage may be done.  HOME CARE INSTRUCTIONS  · Try to eat soft food that is easier to swallow and check your weight on a daily basis to be sure that it is not decreasing.  · Be sure to drink liquids when sitting upright (not lying down).  SEEK MEDICAL CARE IF:  · You are losing weight because you are unable to swallow.  · You are coughing when you drink liquids (aspiration).  · You are coughing up partially digested food.  SEEK IMMEDIATE MEDICAL CARE IF:  · You are unable to swallow your own saliva .  · You are having shortness of breath or a fever, or both.  · You have a hoarse voice along with difficulty swallowing.  MAKE SURE YOU:  · Understand these instructions.  · Will watch your condition.  · Will get help right away if you are not doing well or get worse.  Document Released: 07/01/2000 Document Revised: 03/06/2013 Document Reviewed: 12/21/2012  [employer]® Patient Information ©2014 [employer].

## 2013-08-09 NOTE — Progress Notes (Signed)
Critical Lab value WBC 1.0 received at 0519 by Demetrius Revel.  On call provider notified at [phone].

## 2013-08-09 NOTE — Progress Notes (Signed)
[facility] Progress Note  Subjective: Patient with improving odynophagia, still with some mild dysphagia for solid foods. Tolerating diet for the most part  Objective: Vital signs in last 24 hours: Temp:  [97.9 F (36.6 C)-98.3 F (36.8 C)] 98.1 F (36.7 C) (01/23 0444) Pulse Rate:  [82-88] 82 (01/23 0444) Resp:  [16-18] 18 (01/23 0444) BP: (155-187)/(58-71) 166/58 mmHg (01/23 0532) SpO2:  [95 %-99 %] 98 % (01/23 0444) Weight change:    PE: Unchanged  Lab Results: Results for orders placed during the hospital encounter of 08/06/13 (from the past 24 hour(s))  GLUCOSE, CAPILLARY     Status: Abnormal   Collection Time    08/08/13 12:04 PM      Result Value Range   Glucose-Capillary 173 (*) 70 - 99 mg/dL  GLUCOSE, CAPILLARY     Status: Abnormal   Collection Time    08/08/13  4:58 PM      Result Value Range   Glucose-Capillary 144 (*) 70 - 99 mg/dL  GLUCOSE, CAPILLARY     Status: Abnormal   Collection Time    08/08/13  9:44 PM      Result Value Range   Glucose-Capillary 178 (*) 70 - 99 mg/dL   Comment 1 Notify RN    COMPREHENSIVE METABOLIC PANEL     Status: Abnormal   Collection Time    08/09/13  4:37 AM      Result Value Range   Sodium 136 (*) 137 - 147 mEq/L   Potassium 3.8  3.7 - 5.3 mEq/L   Chloride 100  96 - 112 mEq/L   CO2 25  19 - 32 mEq/L   Glucose, Bld 158 (*) 70 - 99 mg/dL   BUN 8  6 - 23 mg/dL   Creatinine, Ser 0.69  0.50 - 1.10 mg/dL   Calcium 8.4  8.4 - 10.5 mg/dL   Total Protein 5.5 (*) 6.0 - 8.3 g/dL   Albumin 2.9 (*) 3.5 - 5.2 g/dL   AST 8  0 - 37 U/L   ALT 5  0 - 35 U/L   Alkaline Phosphatase 103  39 - 117 U/L   Total Bilirubin 0.2 (*) 0.3 - 1.2 mg/dL   GFR calc non Af Amer >90  >90 mL/min   GFR calc Af Amer >90  >90 mL/min  CBC WITH DIFFERENTIAL     Status: Abnormal   Collection Time    08/09/13  4:37 AM      Result Value Range   WBC 1.0 (*) 4.0 - 10.5 K/uL   RBC 2.71 (*) 3.87 - 5.11 MIL/uL   Hemoglobin 8.4 (*) 12.0 - 15.0 g/dL    HCT 24.7 (*) 36.0 - 46.0 %   MCV 91.1  78.0 - 100.0 fL   MCH 31.0  26.0 - 34.0 pg   MCHC 34.0  30.0 - 36.0 g/dL   RDW 15.4  11.5 - 15.5 %   Platelets 110 (*) 150 - 400 K/uL   Neutrophils Relative % 48  43 - 77 %   Lymphocytes Relative 40  12 - 46 %   Monocytes Relative 4  3 - 12 %   Eosinophils Relative 7 (*) 0 - 5 %   Basophils Relative 1  0 - 1 %   Neutro Abs 0.5 (*) 1.7 - 7.7 K/uL   Lymphs Abs 0.4 (*) 0.7 - 4.0 K/uL   Monocytes Absolute 0.0 (*) 0.1 - 1.0 K/uL   Eosinophils Absolute 0.1  0.0 - 0.7  K/uL   Basophils Absolute 0.0  0.0 - 0.1 K/uL   WBC Morphology TOXIC GRANULATION    GLUCOSE, CAPILLARY     Status: Abnormal   Collection Time    08/09/13  7:59 AM      Result Value Range   Glucose-Capillary 124 (*) 70 - 99 mg/dL   Comment 1 Documented in Chart     Comment 2 Notify RN      Studies/Results: No results found.    Assessment: Dysphagia with nonspecific middle and distal inflammatory changes on EGD with 2 or 3 middle esophageal erosions, no stricture appreciated. Etiology not clear, possibly multifactorial Brushings negative for Candida  Plan: Await biopsy results. Would continue a full course of Diflucan given her neutropenia and continue Carafate and proton pump inhibitor. If continues to have solid food dysphagia over time may need repeat EGD with consideration of dilatation    Kathy Maxwell C 08/09/2013, 9:12 AM

## 2013-08-16 ENCOUNTER — Encounter: Payer: Self-pay | Admitting: Oncology

## 2013-08-16 ENCOUNTER — Other Ambulatory Visit (HOSPITAL_BASED_OUTPATIENT_CLINIC_OR_DEPARTMENT_OTHER): Payer: Medicare HMO

## 2013-08-16 ENCOUNTER — Ambulatory Visit (HOSPITAL_BASED_OUTPATIENT_CLINIC_OR_DEPARTMENT_OTHER): Payer: Medicare HMO | Admitting: Oncology

## 2013-08-16 ENCOUNTER — Ambulatory Visit (HOSPITAL_BASED_OUTPATIENT_CLINIC_OR_DEPARTMENT_OTHER): Payer: Medicare HMO

## 2013-08-16 VITALS — BP 168/69 | HR 99 | Temp 98.9°F | Resp 20 | Ht 62.0 in

## 2013-08-16 VITALS — BP 172/77 | HR 102 | Temp 98.5°F | Resp 18 | Wt 203.3 lb

## 2013-08-16 DIAGNOSIS — C50419 Malignant neoplasm of upper-outer quadrant of unspecified female breast: Secondary | ICD-10-CM

## 2013-08-16 DIAGNOSIS — T451X5A Adverse effect of antineoplastic and immunosuppressive drugs, initial encounter: Secondary | ICD-10-CM

## 2013-08-16 DIAGNOSIS — C773 Secondary and unspecified malignant neoplasm of axilla and upper limb lymph nodes: Secondary | ICD-10-CM

## 2013-08-16 DIAGNOSIS — C50412 Malignant neoplasm of upper-outer quadrant of left female breast: Secondary | ICD-10-CM

## 2013-08-16 DIAGNOSIS — D0590 Unspecified type of carcinoma in situ of unspecified breast: Secondary | ICD-10-CM

## 2013-08-16 DIAGNOSIS — Z170 Estrogen receptor positive status [ER+]: Secondary | ICD-10-CM

## 2013-08-16 DIAGNOSIS — D6481 Anemia due to antineoplastic chemotherapy: Secondary | ICD-10-CM

## 2013-08-16 DIAGNOSIS — Z5111 Encounter for antineoplastic chemotherapy: Secondary | ICD-10-CM

## 2013-08-16 LAB — CBC WITH DIFFERENTIAL/PLATELET
BASO%: 0.5 % (ref 0.0–2.0)
Basophils Absolute: 0 10*3/uL (ref 0.0–0.1)
EOS%: 0.5 % (ref 0.0–7.0)
Eosinophils Absolute: 0 10*3/uL (ref 0.0–0.5)
HCT: 28.7 % — ABNORMAL LOW (ref 34.8–46.6)
HGB: 9.1 g/dL — ABNORMAL LOW (ref 11.6–15.9)
LYMPH%: 15.7 % (ref 14.0–49.7)
MCH: 30.2 pg (ref 25.1–34.0)
MCHC: 31.7 g/dL (ref 31.5–36.0)
MCV: 95.3 fL (ref 79.5–101.0)
MONO#: 0.7 10*3/uL (ref 0.1–0.9)
MONO%: 10.9 % (ref 0.0–14.0)
NEUT%: 72.4 % (ref 38.4–76.8)
NEUTROS ABS: 4.5 10*3/uL (ref 1.5–6.5)
NRBC: 2 % — AB (ref 0–0)
Platelets: 293 10*3/uL (ref 145–400)
RBC: 3.01 10*6/uL — AB (ref 3.70–5.45)
RDW: 17.7 % — AB (ref 11.2–14.5)
WBC: 6.2 10*3/uL (ref 3.9–10.3)
lymph#: 1 10*3/uL (ref 0.9–3.3)

## 2013-08-16 LAB — COMPREHENSIVE METABOLIC PANEL (CC13)
ALK PHOS: 104 U/L (ref 40–150)
ALT: 10 U/L (ref 0–55)
AST: 11 U/L (ref 5–34)
Albumin: 3.6 g/dL (ref 3.5–5.0)
Anion Gap: 10 mEq/L (ref 3–11)
BILIRUBIN TOTAL: 0.29 mg/dL (ref 0.20–1.20)
BUN: 7.4 mg/dL (ref 7.0–26.0)
CO2: 26 mEq/L (ref 22–29)
Calcium: 9.4 mg/dL (ref 8.4–10.4)
Chloride: 104 mEq/L (ref 98–109)
Creatinine: 0.8 mg/dL (ref 0.6–1.1)
Glucose: 272 mg/dl — ABNORMAL HIGH (ref 70–140)
Potassium: 3.9 mEq/L (ref 3.5–5.1)
SODIUM: 140 meq/L (ref 136–145)
TOTAL PROTEIN: 6.2 g/dL — AB (ref 6.4–8.3)

## 2013-08-16 MED ORDER — SODIUM CHLORIDE 0.9 % IV SOLN
Freq: Once | INTRAVENOUS | Status: AC
  Administered 2013-08-16: 11:00:00 via INTRAVENOUS

## 2013-08-16 MED ORDER — DEXAMETHASONE SODIUM PHOSPHATE 20 MG/5ML IJ SOLN
20.0000 mg | Freq: Once | INTRAMUSCULAR | Status: AC
Start: 2013-08-16 — End: 2013-08-16
  Administered 2013-08-16: 20 mg via INTRAVENOUS

## 2013-08-16 MED ORDER — DIPHENHYDRAMINE HCL 50 MG/ML IJ SOLN
50.0000 mg | Freq: Once | INTRAMUSCULAR | Status: AC
  Administered 2013-08-16: 50 mg via INTRAVENOUS

## 2013-08-16 MED ORDER — DEXAMETHASONE SODIUM PHOSPHATE 20 MG/5ML IJ SOLN
INTRAMUSCULAR | Status: AC
  Filled 2013-08-16: qty 5

## 2013-08-16 MED ORDER — FAMOTIDINE IN NACL 20-0.9 MG/50ML-% IV SOLN
20.0000 mg | Freq: Once | INTRAVENOUS | Status: AC
  Administered 2013-08-16: 20 mg via INTRAVENOUS

## 2013-08-16 MED ORDER — FAMOTIDINE IN NACL 20-0.9 MG/50ML-% IV SOLN
INTRAVENOUS | Status: AC
  Filled 2013-08-16: qty 50

## 2013-08-16 MED ORDER — HEPARIN SOD (PORK) LOCK FLUSH 100 UNIT/ML IV SOLN
500.0000 [IU] | Freq: Once | INTRAVENOUS | Status: AC | PRN
  Administered 2013-08-16: 500 [IU]
  Filled 2013-08-16: qty 5

## 2013-08-16 MED ORDER — DIPHENHYDRAMINE HCL 50 MG/ML IJ SOLN
INTRAMUSCULAR | Status: AC
  Filled 2013-08-16: qty 1

## 2013-08-16 MED ORDER — ONDANSETRON 8 MG/NS 50 ML IVPB
INTRAVENOUS | Status: AC
  Filled 2013-08-16: qty 8

## 2013-08-16 MED ORDER — ONDANSETRON 8 MG/50ML IVPB (CHCC)
8.0000 mg | Freq: Once | INTRAVENOUS | Status: AC
  Administered 2013-08-16: 8 mg via INTRAVENOUS

## 2013-08-16 MED ORDER — SODIUM CHLORIDE 0.9 % IV SOLN
80.0000 mg/m2 | Freq: Once | INTRAVENOUS | Status: AC
  Administered 2013-08-16: 162 mg via INTRAVENOUS
  Filled 2013-08-16: qty 27

## 2013-08-16 MED ORDER — SODIUM CHLORIDE 0.9 % IJ SOLN
10.0000 mL | INTRAMUSCULAR | Status: DC | PRN
  Administered 2013-08-16: 10 mL
  Filled 2013-08-16: qty 10

## 2013-08-16 NOTE — Progress Notes (Signed)
[place]  [phone] [phone]  OFFICE PROGRESS NOTE    PATIENT: Kathy Maxwell   DOB: [birth year]  MR#: [id number]  KZS#:[id number]   DD:UKGURKY,HCWCB N, MD SU:  Fanny Skates, MD   DIAGNOSIS: Kathy Maxwell is a 58 y.o. 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  Treated with adjuvant chemotherapy.   PRIOR THERAPY: 1.  Screening mammogram in 12/2012 was abnormal which led to left digital diagnostic mammogram and left breast ultrasound on 03/01/2013. Ultrasound of the left breast showed a hypoechoic mass at 12:30, 6 cm from the nipple, that measured 1.9 x 2.6 x 2.9 cm.  Ultrasound of the left axilla demonstrates a 1.1 x 0.7 x 0.9 cm lymph node with a borderline thickened cortex.  2.  Left breast needle core biopsy at the 12:30 o'clock position and left needle core biopsy of the left axillary lymph node on 03/07/2013 showed invasive mammary carcinoma with features compatible with a grade 1 or 2 ductal carcinoma in the breast needle core biopsy and lymph node was positive for metastatic mammary carcinoma with a macrometastasis and extracapsular extension identified.  Estrogen receptor 100% positive, progesterone receptor 75% positive, Ki-67 15%, HER-2/neu by CISH no amplification.  3.  Bilateral breast MRI on 03/15/2013 showed in the left breast there were at least 4 focus of abnormal spiculated enhancement in the upper left breast centrally.  The entire area measured 7.4 cm x 6.6 cm x 5.1 cm.  The previous biopsy focus was posteriorly located and measured 2.1 x 1 x 2.1 cm.  The anterior focus that was not biopsied measured 3.1 x 2.6 x 2.6 cm.  A lateral focus that was not biopsied measured 1.1 x 1.4 x 1.6 cm.  There were  small medial focus measuring cyst 0.7 x 0.8 x 0.7 cm  Post biopsy changes were identified in the left axilla.  There were abnormal enlarged lymph nodes correlating to the patient's known metastatic neoplasm to the lymph nodes which the largest lymph node measured 1.8 x 0.8 cm.  In the right breast there was an irregular focus of 3.3 x 3.1 x 1.3 cm abnormal enhancement in the central right breast in the middle one third.  There was a mild thickened cortex and prominent lymph nodes within the right axilla with the largest measuring 1.6 x 1 cm.  There was a posterior central upper breast focus with associated clip artifact measuring 1.2 x 2.2 x 1 cm previously biopsied to be benign.  Suspicious findings in bilateral breasts.  4.  Anterior left breast needle core biopsy and central right breast needle core biopsy on 04/03/2013.  Left breast needle core biopsy showed invasive ductal carcinoma and ductal carcinoma in situ, 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% positive, progesterone receptor 23% positive, Ki-67 33%, and HER-2/neu by CISH no amplification.  Right breast needle core biopsy showed ductal carcinoma in situ, 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% positive, progesterone receptor 11% positive.  5.  Status post left breast modified radical mastectomy with left axillary lymph node resection and right breast lumpectomy with right axillary node biopsy on 04/22/2013 with pathology results as follows:  Left breast: Stage IIIA, pT3, pN2a, invasive ductal carcinoma with multiple foci, grade 1, largest span was 5.5 cm and ductal carcinoma in situ low-grade, 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% positive,  progesterone receptor 23% positive, HER-2/neu no amplification, Ki-67 33%, with 4/22 metastatic lymph nodes and 1 isolated tumor cells identified within left axillary lymph node resection.  Extracapsular extension was present and surgical resection margins were negative for carcinoma.  Right breast: Stage 0, pTis, pN0, 3.0 cm ductal carcinoma in situ with  calcifications, intermediate to high-grade, 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% positive, progesterone receptor 11% positive, with 0/4 metastatic right axillary lymph nodes.  Reexcision of right breast medial margin: Ductal carcinoma in situ, intermediate grade spanning 0.8 cm, ductal carcinoma in situ is focally less than 0.1 cm from the new medial margin, lobular neoplasia (atypical lobular hyperplasia).  6.  Referred for genetics counseling and testing - appointment scheduled for 06/20/2013.  7.  Adjuvant chemotherapy consisting of FEC (5-FU/epirubicin/Cytoxan)  that will be followed by adjuvant chemotherapy consisting of Taxol.  2-D echocardiogram on 04/18/2013 showed a left ventricular ejection fraction of 55% - 60%.   Started adjuvant chemotherapy with FEC on 05/24/2013 - 07/23/13.  CURRENT THERAPY:  Taxol cycle 1  INTERVAL HISTORY: Kathy Maxwell is here for followup prior to receiving cycle 1 of taxol.  She is doing well today.  She denies fevers, chills, nausea, vomiting, constipation, diarrhea, numbness, mucositis.  She has mild darkening of her nails and tenderness on the tops of her nails.  Otherwise, she is well and a 10 point ROS is negative.   PAST MEDICAL HISTORY: Past Medical History  Diagnosis Date  . Hyperlipidemia   . Hypertension   . Bipolar 1 disorder   . Cancer   . Hypertension 04/04/2013  . Hyperlipidemia 04/04/2013  . Bipolar 1 disorder 04/04/2013  . Breast cancer 03/10/13    left breast invasive ca  . Allergy     latex  . Obesity   . Diabetes mellitus without complication     NIDDM  . Sleep apnea     no cpap due to insurance  . Heart murmur   . GERD (gastroesophageal reflux disease)   . Coronary artery disease     PAST SURGICAL HISTORY: Past Surgical History  Procedure Laterality Date  . Cholecystectomy    . Breast biopsy Left 05/08/09    fibroadenoma with microcalcifications,no malignancy id  . Breast biopsy Left 03/07/13    invasive ca,1 lymph node metastatic  .  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 Abdominal hysterectomy    . Coronary stent placement  2001  . Cardiac catheterization    . Maxwell surgery  6/13,9/14    laser,cataract lft  . Coronary angioplasty      LAD stent 2003  . Portacath placement Right 04/22/2013    Procedure: INSERTION PORT-A-CATH;  Surgeon: Adin Hector, MD;  Location: [facility];  Service: General;  Laterality: Right;  . Mastectomy modified radical Left 04/22/2013    Procedure: MASTECTOMY MODIFIED RADICAL;  Surgeon: Adin Hector, MD;  Location: [facility];  Service: General;  Laterality: Left;  . Breast lumpectomy with needle localization and axillary sentinel lymph node bx Right 04/22/2013    Procedure: BREAST LUMPECTOMY WITH NEEDLE LOCALIZATION AND AXILLARY SENTINEL LYMPH NODE BX;  Surgeon: Adin Hector, MD;  Location: [facility];  Service: General;  Laterality: Right;  . Esophagogastroduodenoscopy N/A 08/07/2013    Procedure: ESOPHAGOGASTRODUODENOSCOPY (EGD);  Surgeon: Missy Sabins, MD;  Location: [facility];  Service: Endoscopy;  Laterality: N/A;    FAMILY HISTORY: Family History  Problem Relation Age of Onset  . Heart disease Mother   .  Breast cancer Maternal Aunt     dx in her 50s  . Heart disease Cousin 27    maternal cousin  . Heart attack Sister 78  . Cancer Paternal Aunt     NOS  . Cancer Maternal Aunt     NOS  . Lung cancer Cousin 24    maternal cousin - non smoker  . Brain cancer Cousin 13    maternal cousin's son  . Lung cancer Cousin 45    maternal cousin's son  . Breast cancer Cousin     paternal cousin    SOCIAL HISTORY: History  Substance Use Topics  . Smoking status: Former Smoker -- 1.00 packs/day for 15 years    Types: Cigarettes    Quit date: 07/19/1987  . Smokeless tobacco: Never Used  . Alcohol Use: No    ALLERGIES: Allergies  Allergen Reactions  . Adhesive [Tape] Rash    Clear tape gloves ,elastic no problem      MEDICATIONS:  Current Outpatient Prescriptions  Medication Sig Dispense Refill  . amLODipine (NORVASC) 10 MG tablet Take 10 mg by mouth daily.      . Azilsartan-Chlorthalidone 40-25 MG TABS Take 1 tablet by mouth daily.       [name] buPROPion (WELLBUTRIN SR) 150 MG 12 hr tablet Take 150 mg by mouth daily.       . ciprofloxacin (CIPRO) 500 MG/5ML (10%) suspension Take 5 mLs (500 mg total) by mouth 2 (two) times daily.  100 mL  0  . dexamethasone (DECADRON) 4 MG tablet Take 2 tablets by mouth once a day on the day after chemotherapy and then take 2 tablets two times a day for 2 days. Take with food.  30 tablet  1  . fluconazole (DIFLUCAN) 10 MG/ML suspension Take 10 mLs (100 mg total) by mouth daily.  100 mL  0  . hydrALAZINE (APRESOLINE) 25 MG tablet Take 25 mg by mouth 2 (two) times daily.      [name] HYDROcodone-acetaminophen (NORCO/VICODIN) 5-325 MG per tablet Take 1-2 tablets by mouth every 4 (four) hours as needed.  30 tablet  0  . lidocaine-prilocaine (EMLA) cream Apply topically as needed.  30 g  0  . LORazepam (ATIVAN) 0.5 MG tablet Take 1 tablet (0.$RemoveBef'5mg'FDPIFtsLWs$ ) every 6 hours as needed for nausea or vomiting.  30 tablet  0  . losartan (COZAAR) 100 MG tablet Take 100 mg by mouth daily.       . metoprolol succinate (TOPROL-XL) 25 MG 24 hr tablet Take 25 mg by mouth daily.      . ondansetron (ZOFRAN) 8 MG tablet Take 1 tablet ($RemoveB'8mg'tCZgSNed$ s) every 12 hours as needed for nausea or vomiting starting the 3rd day after chemotherapy.  30 tablet  1  . pantoprazole (PROTONIX) 40 MG tablet Take 1 tablet (40 mg total) by mouth daily.  30 tablet  3  . pioglitazone-metformin (ACTOPLUS MET) 15-850 MG per tablet Take 1 tablet by mouth 2 (two) times daily with a meal.      . prochlorperazine (COMPAZINE) 10 MG tablet Take 1 tablet (10 mg total) by mouth every 6 (six) hours as needed (Nausea or vomiting).  30 tablet  1  . rosuvastatin (CRESTOR) 5 MG tablet Take 5 mg by mouth daily.      . sitaGLIPtin (JANUVIA) 100 MG tablet  Take 100 mg by mouth daily.      . sucralfate (CARAFATE) 1 GM/10ML suspension Take 10 mLs (1 g total) by mouth 4 (four) times  daily -  with meals and at bedtime.  420 mL  0  . PRESCRIPTION MEDICATION palonosetron (ALOXI) injection 0.25 mg 0.25 mg  Once 08/02/2013 08/02/2013      Route: Intravenous      . PRESCRIPTION MEDICATION epirubicin (ELLENCE) chemo injection 200 mg 100 mg/m2  2 m2 (Order-Specific)  Once 08/02/2013      . PRESCRIPTION MEDICATION fluorouracil (ADRUCIL) chemo injection 1,000 mg 500 mg/m2  2.02 m2 (Treatment Plan Actual)  Once 08/02/2013      . PRESCRIPTION MEDICATION cyclophosphamide (CYTOXAN) 1,000 mg in sodium chloride 0.9 % 250 mL chemo infusion 500 mg/m2  2 m2 (Order-Specific)  Once 08/02/2013       No current facility-administered medications for this visit.   Facility-Administered Medications Ordered in Other Visits  Medication Dose Route Frequency Provider Last Rate Last Dose  . sodium chloride 0.9 % injection 10 mL  10 mL Intravenous PRN [name], MD   10 mL at 05/24/13 1522      REVIEW OF SYSTEMS: A 10 point review of systems was completed and is negative except as noted above.   PHYSICAL EXAMINATION: BP 172/77  Pulse 102  Temp(Src) 98.5 F (36.9 C) (Oral)  Resp 18  Wt 203 lb 4.8 oz (92.216 kg)  GENERAL: Patient is a well appearing female in no acute distress HEENT:  Sclerae anicteric.  Oropharynx clear and moist. No ulcerations or evidence of oropharyngeal candidiasis. Neck is supple.  NODES:  No cervical, supraclavicular, or axillary lymphadenopathy palpated.  BREAST EXAM:  Deferred. LUNGS:  Clear to auscultation bilaterally.  No wheezes or rhonchi. HEART:  Regular rate and rhythm. No murmur appreciated. ABDOMEN:  Soft, nontender.  Positive, normoactive bowel sounds. No organomegaly palpated. MSK:  No focal spinal tenderness to palpation. Full range of motion bilaterally in the upper extremities. EXTREMITIES:  No peripheral edema.   SKIN:  Clear  with no obvious rashes or skin changes. Nails have started to become darkened at nail beds and are brittle, palms are hyperpigmented NEURO:  Nonfocal. Well oriented.  Appropriate affect. ECOG FS:  0 - Asymptomatic   LAB RESULTS: Lab Results  Component Value Date   WBC 6.2 08/16/2013   NEUTROABS 4.5 08/16/2013   HGB 9.1* 08/16/2013   HCT 28.7* 08/16/2013   MCV 95.3 08/16/2013   PLT 293 08/16/2013      Chemistry      Component Value Date/Time   NA 136* 08/09/2013 0437   NA 142 08/02/2013 1200   K 3.8 08/09/2013 0437   K 3.2* 08/02/2013 1200   CL 100 08/09/2013 0437   CO2 25 08/09/2013 0437   CO2 28 08/02/2013 1200   BUN 8 08/09/2013 0437   BUN 7.4 08/02/2013 1200   CREATININE 0.69 08/09/2013 0437   CREATININE 0.8 08/02/2013 1200      Component Value Date/Time   CALCIUM 8.4 08/09/2013 0437   CALCIUM 9.0 08/02/2013 1200   ALKPHOS 103 08/09/2013 0437   ALKPHOS 101 08/02/2013 1200   AST 8 08/09/2013 0437   AST 10 08/02/2013 1200   ALT 5 08/09/2013 0437   ALT 8 08/02/2013 1200   BILITOT 0.2* 08/09/2013 0437   BILITOT 0.30 08/02/2013 1200       No results found for this basename: [id number]    No components found with this basename: [id number]     RADIOGRAPHIC STUDIES: 1.    2-D echocardiogram on 04/18/2013 showed a left ventricular ejection fraction of 55% - 60%    ASSESSMENT/PLAN:  Kathy Maxwell is a  58 y.o. woman:  1.  Status post left breast modified radical mastectomy with left axillary lymph node resection and right breast lumpectomy with right axillary node biopsy on 04/22/2013 with pathology results as follows:  Left breast: Stage IIIA, pT3, pN2a, invasive ductal carcinoma with multiple foci, grade 1, largest span was 5.5 cm and ductal carcinoma in situ low-grade, 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% positive, progesterone receptor 23% positive, HER-2/neu no amplification, Ki-67 33%, with 4/22 metastatic lymph nodes and 1 isolated tumor cells identified within left axillary lymph node resection.   Extracapsular extension was present and surgical resection margins were negative for carcinoma.  Right breast: Stage 0, pTis, pN0, 3.0 cm ductal carcinoma in situ with calcifications, intermediate to high-grade, 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% positive, progesterone receptor 11% positive, with 0/4 metastatic right axillary lymph nodes.  Reexcision of right breast medial margin: Ductal carcinoma in situ, intermediate grade spanning 0.8 cm, ductal carcinoma in situ is focally less than 0.1 cm from the new medial margin, lobular neoplasia (atypical lobular hyperplasia).  2.  Referred for genetics counseling/testing. 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  S/P Adjuvant chemotherapy consisting of FEC (5-FU/Epirubicin/Cytoxan)  that will be followed by adjuvant chemotherapy consisting of Taxol.  2-D echocardiogram on 04/18/2013 showed a left ventricular ejection fraction of 55% - 60%.   Started adjuvant chemotherapy with FEC on 05/24/2013 - 07/23/13 X 6 CYCLES  3 Begin taxol adjuvant weekly today. Risks and benefits discussed with patient. She has given consent.  #4 anemia due to chemotherapy   All questions were answered.  Ms. Charlet was encouraged to contact us in the interim with any problems, questions or concerns  I spent 20 minutes counseling the patient face to face.  The total time spent in the appointment was 30 minutes.  Marcy Panning, MD Medical/Oncology [facility] [phone] (beeper) [phone] (Office)  08/16/2013, 9:23 AM

## 2013-08-16 NOTE — Patient Instructions (Signed)
[facility] Discharge Instructions for Patients Receiving Chemotherapy  Today you received the following chemotherapy agents :  Taxol.  To help prevent nausea and vomiting after your treatment, we encourage you to take your nausea medication as instructed by your physician.  Take Zofran 8mg  by mouth every 12 hours as needed for nausea - starting third day after chemo.  Can also take Ativan 0.5mg  by mouth every 6 hours as needed for nausea.  DO NOT Drive after taking Ativan ( Lorazepam ) as it can cause drowsiness. DO NOT Eat  Greasy  Nor  Spicy  Foods.  DO Drink lots of fluids as tolerated.   If you develop nausea and vomiting that is not controlled by your nausea medication, call the clinic.   BELOW ARE SYMPTOMS THAT SHOULD BE REPORTED IMMEDIATELY:  *FEVER GREATER THAN 100.5 F  *CHILLS WITH OR WITHOUT FEVER  NAUSEA AND VOMITING THAT IS NOT CONTROLLED WITH YOUR NAUSEA MEDICATION  *UNUSUAL SHORTNESS OF BREATH  *UNUSUAL BRUISING OR BLEEDING  TENDERNESS IN MOUTH AND THROAT WITH OR WITHOUT PRESENCE OF ULCERS  *URINARY PROBLEMS  *BOWEL PROBLEMS  UNUSUAL RASH Items with * indicate a potential emergency and should be followed up as soon as possible.  Feel free to call the clinic you have any questions or concerns. The clinic phone number is [phone].

## 2013-08-19 ENCOUNTER — Telehealth: Payer: Self-pay | Admitting: *Deleted

## 2013-08-19 NOTE — Telephone Encounter (Signed)
PT. IS EATING AND FORCING FLUIDS. NO NAUSEA OR VOMITING. NO DIARRHEA BUT A SLIGHT PROBLEM WITH CONSTIPATION TODAY. PT. ATE A SALAD AND LATER TODAY HAD A GOOD BOWEL MOVEMENT. DISCUSSED THE POSSIBLE NEED OF USING A STOOL SOFTENER. PT. VOICES UNDERSTANDING. SHE WILL CALL THIS OFFICE OR THE ON CALL PHYSICIAN IF THE NEED ARISES.

## 2013-08-23 ENCOUNTER — Ambulatory Visit (HOSPITAL_BASED_OUTPATIENT_CLINIC_OR_DEPARTMENT_OTHER): Payer: Medicare HMO | Admitting: Oncology

## 2013-08-23 ENCOUNTER — Encounter: Payer: Self-pay | Admitting: Oncology

## 2013-08-23 ENCOUNTER — Other Ambulatory Visit (HOSPITAL_BASED_OUTPATIENT_CLINIC_OR_DEPARTMENT_OTHER): Payer: Medicare HMO

## 2013-08-23 ENCOUNTER — Ambulatory Visit (HOSPITAL_BASED_OUTPATIENT_CLINIC_OR_DEPARTMENT_OTHER): Payer: Medicare HMO

## 2013-08-23 VITALS — BP 195/80 | HR 94 | Temp 98.6°F | Resp 19 | Ht 62.0 in | Wt 204.1 lb

## 2013-08-23 DIAGNOSIS — C50919 Malignant neoplasm of unspecified site of unspecified female breast: Secondary | ICD-10-CM

## 2013-08-23 DIAGNOSIS — D0590 Unspecified type of carcinoma in situ of unspecified breast: Secondary | ICD-10-CM

## 2013-08-23 DIAGNOSIS — C773 Secondary and unspecified malignant neoplasm of axilla and upper limb lymph nodes: Secondary | ICD-10-CM

## 2013-08-23 DIAGNOSIS — Z170 Estrogen receptor positive status [ER+]: Secondary | ICD-10-CM

## 2013-08-23 DIAGNOSIS — C50412 Malignant neoplasm of upper-outer quadrant of left female breast: Secondary | ICD-10-CM

## 2013-08-23 DIAGNOSIS — Z5111 Encounter for antineoplastic chemotherapy: Secondary | ICD-10-CM

## 2013-08-23 LAB — CBC WITH DIFFERENTIAL/PLATELET
BASO%: 0.5 % (ref 0.0–2.0)
Basophils Absolute: 0 10*3/uL (ref 0.0–0.1)
EOS%: 0.7 % (ref 0.0–7.0)
Eosinophils Absolute: 0 10*3/uL (ref 0.0–0.5)
HEMATOCRIT: 28.6 % — AB (ref 34.8–46.6)
HGB: 9.3 g/dL — ABNORMAL LOW (ref 11.6–15.9)
LYMPH%: 11 % — ABNORMAL LOW (ref 14.0–49.7)
MCH: 30.6 pg (ref 25.1–34.0)
MCHC: 32.5 g/dL (ref 31.5–36.0)
MCV: 94.1 fL (ref 79.5–101.0)
MONO#: 0.6 10*3/uL (ref 0.1–0.9)
MONO%: 9.9 % (ref 0.0–14.0)
NEUT#: 4.6 10*3/uL (ref 1.5–6.5)
NEUT%: 77.9 % — AB (ref 38.4–76.8)
Platelets: 310 10*3/uL (ref 145–400)
RBC: 3.04 10*6/uL — ABNORMAL LOW (ref 3.70–5.45)
RDW: 16.6 % — ABNORMAL HIGH (ref 11.2–14.5)
WBC: 5.8 10*3/uL (ref 3.9–10.3)
lymph#: 0.6 10*3/uL — ABNORMAL LOW (ref 0.9–3.3)

## 2013-08-23 LAB — COMPREHENSIVE METABOLIC PANEL (CC13)
ALT: 8 U/L (ref 0–55)
ANION GAP: 10 meq/L (ref 3–11)
AST: 11 U/L (ref 5–34)
Albumin: 3.5 g/dL (ref 3.5–5.0)
Alkaline Phosphatase: 86 U/L (ref 40–150)
BILIRUBIN TOTAL: 0.31 mg/dL (ref 0.20–1.20)
BUN: 7.2 mg/dL (ref 7.0–26.0)
CO2: 21 mEq/L — ABNORMAL LOW (ref 22–29)
CREATININE: 0.7 mg/dL (ref 0.6–1.1)
Calcium: 9.2 mg/dL (ref 8.4–10.4)
Chloride: 107 mEq/L (ref 98–109)
Glucose: 242 mg/dl — ABNORMAL HIGH (ref 70–140)
Potassium: 3.6 mEq/L (ref 3.5–5.1)
Sodium: 137 mEq/L (ref 136–145)
Total Protein: 5.9 g/dL — ABNORMAL LOW (ref 6.4–8.3)

## 2013-08-23 MED ORDER — SODIUM CHLORIDE 0.9 % IV SOLN
Freq: Once | INTRAVENOUS | Status: AC
  Administered 2013-08-23: 10:00:00 via INTRAVENOUS

## 2013-08-23 MED ORDER — SODIUM CHLORIDE 0.9 % IV SOLN
80.0000 mg/m2 | Freq: Once | INTRAVENOUS | Status: AC
  Administered 2013-08-23: 162 mg via INTRAVENOUS
  Filled 2013-08-23: qty 27

## 2013-08-23 MED ORDER — HEPARIN SOD (PORK) LOCK FLUSH 100 UNIT/ML IV SOLN
500.0000 [IU] | Freq: Once | INTRAVENOUS | Status: AC | PRN
  Administered 2013-08-23: 500 [IU]
  Filled 2013-08-23: qty 5

## 2013-08-23 MED ORDER — DIPHENHYDRAMINE HCL 50 MG/ML IJ SOLN
INTRAMUSCULAR | Status: AC
  Filled 2013-08-23: qty 1

## 2013-08-23 MED ORDER — GABAPENTIN 100 MG PO CAPS: 100.0000 mg | ORAL_CAPSULE | Freq: Three times a day (TID) | ORAL | Status: DC

## 2013-08-23 MED ORDER — ONDANSETRON 8 MG/NS 50 ML IVPB
INTRAVENOUS | Status: AC
  Filled 2013-08-23: qty 8

## 2013-08-23 MED ORDER — FAMOTIDINE IN NACL 20-0.9 MG/50ML-% IV SOLN
INTRAVENOUS | Status: AC
  Filled 2013-08-23: qty 50

## 2013-08-23 MED ORDER — DIPHENHYDRAMINE HCL 50 MG/ML IJ SOLN
50.0000 mg | Freq: Once | INTRAMUSCULAR | Status: AC
  Administered 2013-08-23: 50 mg via INTRAVENOUS

## 2013-08-23 MED ORDER — ONDANSETRON 8 MG/50ML IVPB (CHCC)
8.0000 mg | Freq: Once | INTRAVENOUS | Status: AC
  Administered 2013-08-23: 8 mg via INTRAVENOUS

## 2013-08-23 MED ORDER — DEXAMETHASONE SODIUM PHOSPHATE 20 MG/5ML IJ SOLN
INTRAMUSCULAR | Status: AC
  Filled 2013-08-23: qty 5

## 2013-08-23 MED ORDER — SODIUM CHLORIDE 0.9 % IJ SOLN
10.0000 mL | INTRAMUSCULAR | Status: DC | PRN
  Administered 2013-08-23: 10 mL
  Filled 2013-08-23: qty 10

## 2013-08-23 MED ORDER — FAMOTIDINE IN NACL 20-0.9 MG/50ML-% IV SOLN
20.0000 mg | Freq: Once | INTRAVENOUS | Status: AC
  Administered 2013-08-23: 20 mg via INTRAVENOUS

## 2013-08-23 MED ORDER — DEXAMETHASONE SODIUM PHOSPHATE 20 MG/5ML IJ SOLN
20.0000 mg | Freq: Once | INTRAMUSCULAR | Status: AC
  Administered 2013-08-23: 20 mg via INTRAVENOUS

## 2013-08-23 NOTE — Patient Instructions (Signed)
[facility] Discharge Instructions for Patients Receiving Chemotherapy  Today you received the following chemotherapy agents: Taxol.  To help prevent nausea and vomiting after your treatment, we encourage you to take your nausea medication as prescribed.   If you develop nausea and vomiting that is not controlled by your nausea medication, call the clinic.   BELOW ARE SYMPTOMS THAT SHOULD BE REPORTED IMMEDIATELY:  *FEVER GREATER THAN 100.5 F  *CHILLS WITH OR WITHOUT FEVER  NAUSEA AND VOMITING THAT IS NOT CONTROLLED WITH YOUR NAUSEA MEDICATION  *UNUSUAL SHORTNESS OF BREATH  *UNUSUAL BRUISING OR BLEEDING  TENDERNESS IN MOUTH AND THROAT WITH OR WITHOUT PRESENCE OF ULCERS  *URINARY PROBLEMS  *BOWEL PROBLEMS  UNUSUAL RASH Items with * indicate a potential emergency and should be followed up as soon as possible.  Feel free to call the clinic you have any questions or concerns. The clinic phone number is [phone].    

## 2013-08-23 NOTE — Patient Instructions (Signed)
Neuropathy: Take superB complex 1 daily  And gabapentin 1 capsule three times a day  Neuropathic Pain We often think that pain has a physical cause. If we get rid of the cause, the pain should go away. Nerves themselves can also cause pain. It is called neuropathic pain, which means nerve abnormality. It may be difficult for the patients who have it and for the treating caregivers. Pain is usually described as acute (short-lived) or chronic (long-lasting). Acute pain is related to the physical sensations caused by an injury. It can last from a few seconds to many weeks, but it usually goes away when normal healing occurs. Chronic pain lasts beyond the typical healing time. With neuropathic pain, the nerve fibers themselves may be damaged or injured. They then send incorrect signals to other pain centers. The pain you feel is real, but the cause is not easy to find.  CAUSES  Chronic pain can result from diseases, such as diabetes and shingles (an infection related to chickenpox), or from trauma, surgery, or amputation. It can also happen without any known injury or disease. The nerves are sending pain messages, even though there is no identifiable cause for such messages.   Other common causes of neuropathy include diabetes, phantom limb pain, or Regional Pain Syndrome (RPS).  As with all forms of chronic back pain, if neuropathy is not correctly treated, there can be a number of associated problems that lead to a downward cycle for the patient. These include depression, sleeplessness, feelings of fear and anxiety, limited social interaction and inability to do normal daily activities or work.  The most dramatic and mysterious example of neuropathic pain is called "phantom limb syndrome." This occurs when an arm or a leg has been removed because of illness or injury. The brain still gets pain messages from the nerves that originally carried impulses from the missing limb. These nerves now seem to misfire  and cause troubling pain.  Neuropathic pain often seems to have no cause. It responds poorly to standard pain treatment. Neuropathic pain can occur after:  Shingles (herpes zoster virus infection).  A lasting burning sensation of the skin, caused usually by injury to a peripheral nerve.  Peripheral neuropathy which is widespread nerve damage, often caused by diabetes or alcoholism.  Phantom limb pain following an amputation.  Facial nerve problems (trigeminal neuralgia).  Multiple sclerosis.  Reflex sympathetic dystrophy.  Pain which comes with cancer and cancer chemotherapy.  Entrapment neuropathy such as when pressure is put on a nerve such as in carpal tunnel syndrome.  Back, leg, and hip problems (sciatica).  Spine or back surgery.  HIV Infection or AIDS where nerves are infected by viruses. Your caregiver can explain items in the above list which may apply to you. SYMPTOMS  Characteristics of neuropathic pain are:  Severe, sharp, electric shock-like, shooting, lightening-like, knife-like.  Pins and needles sensation.  Deep burning, deep cold, or deep ache.  Persistent numbness, tingling, or weakness.  Pain resulting from light touch or other stimulus that would not usually cause pain.  Increased sensitivity to something that would normally cause pain, such as a pinprick. Pain may persist for months or years following the healing of damaged tissues. When this happens, pain signals no longer sound an alarm about current injuries or injuries about to happen. Instead, the alarm system itself is not working correctly.  Neuropathic pain may get worse instead of better over time. For some people, it can lead to serious disability. It is important  to be aware that severe injury in a limb can occur without a proper, protective pain response.Burns, cuts, and other injuries may go unnoticed. Without proper treatment, these injuries can become infected or lead to further  disability. Take any injury seriously, and consult your caregiver for treatment. DIAGNOSIS  When you have a pain with no known cause, your caregiver will probably ask some specific questions:   Do you have any other conditions, such as diabetes, shingles, multiple sclerosis, or HIV infection?  How would you describe your pain? (Neuropathic pain is often described as shooting, stabbing, burning, or searing.)  Is your pain worse at any time of the day? (Neuropathic pain is usually worse at night.)  Does the pain seem to follow a certain physical pathway?  Does the pain come from an area that has missing or injured nerves? (An example would be phantom limb pain.)  Is the pain triggered by minor things such as rubbing against the sheets at night? These questions often help define the type of pain involved. Once your caregiver knows what is happening, treatment can begin. Anticonvulsant, antidepressant drugs, and various pain relievers seem to work in some cases. If another condition, such as diabetes is involved, better management of that disorder may relieve the neuropathic pain.  TREATMENT  Neuropathic pain is frequently long-lasting and tends not to respond to treatment with narcotic type pain medication. It may respond well to other drugs such as antiseizure and antidepressant medications. Usually, neuropathic problems do not completely go away, but partial improvement is often possible with proper treatment. Your caregivers have large numbers of medications available to treat you. Do not be discouraged if you do not get immediate relief. Sometimes different medications or a combination of medications will be tried before you receive the results you are hoping for. See your caregiver if you have pain that seems to be coming from nowhere and does not go away. Help is available.  SEEK IMMEDIATE MEDICAL CARE IF:   There is a sudden change in the quality of your pain, especially if the change is on  only one side of the body.  You notice changes of the skin, such as redness, black or purple discoloration, swelling, or an ulcer.  You cannot move the affected limbs. Document Released: 03/31/2004 Document Revised: 09/26/2011 Document Reviewed: 03/31/2004 [facility] Patient Information 2014 [place].

## 2013-08-27 NOTE — Progress Notes (Signed)
[place]  [phone] [phone]  OFFICE PROGRESS NOTE    PATIENT: Kathy Maxwell   DOB: April 13, 1956  MR#: [id number]  BVQ#:[id number]   CM:[id number],XTAVW N, MD SU:  Fanny Skates, MD   DIAGNOSIS: Kathy Maxwell is a 58 y.o. 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  Treated with adjuvant chemotherapy.   PRIOR THERAPY: 1.  Screening mammogram in 12/2012 was abnormal which led to left digital diagnostic mammogram and left breast ultrasound on 03/01/2013. Ultrasound of the left breast showed a hypoechoic mass at 12:30, 6 cm from the nipple, that measured 1.9 x 2.6 x 2.9 cm.  Ultrasound of the left axilla demonstrates a 1.1 x 0.7 x 0.9 cm lymph node with a borderline thickened cortex.  2.  Left breast needle core biopsy at the 12:30 o'clock position and left needle core biopsy of the left axillary lymph node on 03/07/2013 showed invasive mammary carcinoma with features compatible with a grade 1 or 2 ductal carcinoma in the breast needle core biopsy and lymph node was positive for metastatic mammary carcinoma with a macrometastasis and extracapsular extension identified.  Estrogen receptor 100% positive, progesterone receptor 75% positive, Ki-67 15%, HER-2/neu by CISH no amplification.  3.  Bilateral breast MRI on 03/15/2013 showed in the left breast there were at least 4 focus of abnormal spiculated enhancement in the upper left breast centrally.  The entire area measured 7.4 cm x 6.6 cm x 5.1 cm.  The previous biopsy focus was posteriorly located and measured 2.1 x 1 x 2.1 cm.  The anterior focus that was not biopsied measured 3.1 x 2.6 x 2.6 cm.  A lateral focus that was not biopsied measured 1.1 x 1.4 x 1.6 cm.  There were  small medial focus measuring cyst 0.7 x 0.8 x 0.7 cm  Post biopsy changes were identified in the left axilla.  There were abnormal enlarged lymph nodes correlating to the patient's known metastatic neoplasm to the lymph nodes which the largest lymph node measured 1.8 x 0.8 cm.  In the right breast there was an irregular focus of 3.3 x 3.1 x 1.3 cm abnormal enhancement in the central right breast in the middle one third.  There was a mild thickened cortex and prominent lymph nodes within the right axilla with the largest measuring 1.6 x 1 cm.  There was a posterior central upper breast focus with associated clip artifact measuring 1.2 x 2.2 x 1 cm previously biopsied to be benign.  Suspicious findings in bilateral breasts.  4.  Anterior left breast needle core biopsy and central right breast needle core biopsy on 04/03/2013.  Left breast needle core biopsy showed invasive ductal carcinoma and ductal carcinoma in situ, 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% positive, progesterone receptor 23% positive, Ki-67 33%, and HER-2/neu by CISH no amplification.  Right breast needle core biopsy showed ductal carcinoma in situ, 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% positive, progesterone receptor 11% positive.  5.  Status post left breast modified radical mastectomy with left axillary lymph node resection and right breast lumpectomy with right axillary node biopsy on 04/22/2013 with pathology results as follows:  Left breast: Stage IIIA, pT3, pN2a, invasive ductal carcinoma with multiple foci, grade 1, largest span was 5.5 cm and ductal carcinoma in situ low-grade, 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% positive,  progesterone receptor 23% positive, HER-2/neu no amplification, Ki-67 33%, with 4/22 metastatic lymph nodes and 1 isolated tumor cells identified within left axillary lymph node resection.  Extracapsular extension was present and surgical resection margins were negative for carcinoma.  Right breast: Stage 0, pTis, pN0, 3.0 cm ductal carcinoma in situ with  calcifications, intermediate to high-grade, 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% positive, progesterone receptor 11% positive, with 0/4 metastatic right axillary lymph nodes.  Reexcision of right breast medial margin: Ductal carcinoma in situ, intermediate grade spanning 0.8 cm, ductal carcinoma in situ is focally less than 0.1 cm from the new medial margin, lobular neoplasia (atypical lobular hyperplasia).  6.  Referred for genetics counseling and testing - appointment scheduled for 06/20/2013.  7.  Adjuvant chemotherapy consisting of FEC (5-FU/epirubicin/Cytoxan)  that will be followed by adjuvant chemotherapy consisting of Taxol.  2-D echocardiogram on 04/18/2013 showed a left ventricular ejection fraction of 55% - 60%.   Started adjuvant chemotherapy with FEC on 05/24/2013 - 07/23/13.  CURRENT THERAPY:  Taxol cycle 2  INTERVAL HISTORY: Kathy Maxwell is here for followup prior to receiving cycle 2 of taxol.  She is doing well today.  She denies fevers, chills, nausea, vomiting, constipation, diarrhea, numbness, mucositis.  She has mild darkening of her nails and tenderness on the tops of her nails.  Otherwise, she is well and a 10 point ROS is negative.   PAST MEDICAL HISTORY: Past Medical History  Diagnosis Date  . Hyperlipidemia   . Hypertension   . Bipolar 1 disorder   . Cancer   . Hypertension 04/04/2013  . Hyperlipidemia 04/04/2013  . Bipolar 1 disorder 04/04/2013  . Breast cancer 03/10/13    left breast invasive ca  . Allergy     latex  . Obesity   . Diabetes mellitus without complication     NIDDM  . Sleep apnea     no cpap due to insurance  . Heart murmur   . GERD (gastroesophageal reflux disease)   . Coronary artery disease     PAST SURGICAL HISTORY: Past Surgical History  Procedure Laterality Date  . Cholecystectomy    . Breast biopsy Left 05/08/09    fibroadenoma with microcalcifications,no malignancy id  . Breast biopsy Left 03/07/13    invasive ca,1 lymph node metastatic  .  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 Abdominal hysterectomy    . Coronary stent placement  2001  . Cardiac catheterization    . Eye surgery  6/13,9/14    laser,cataract lft  . Coronary angioplasty      LAD stent 2003  . Portacath placement Right 04/22/2013    Procedure: INSERTION PORT-A-CATH;  Surgeon: Adin Hector, MD;  Location: [facility];  Service: General;  Laterality: Right;  . Mastectomy modified radical Left 04/22/2013    Procedure: MASTECTOMY MODIFIED RADICAL;  Surgeon: Adin Hector, MD;  Location: [facility];  Service: General;  Laterality: Left;  . Breast lumpectomy with needle localization and axillary sentinel lymph node bx Right 04/22/2013    Procedure: BREAST LUMPECTOMY WITH NEEDLE LOCALIZATION AND AXILLARY SENTINEL LYMPH NODE BX;  Surgeon: Adin Hector, MD;  Location: [facility];  Service: General;  Laterality: Right;  . Esophagogastroduodenoscopy N/A 08/07/2013    Procedure: ESOPHAGOGASTRODUODENOSCOPY (EGD);  Surgeon: Missy Sabins, MD;  Location: [facility];  Service: Endoscopy;  Laterality: N/A;    FAMILY HISTORY: Family History  Problem Relation Age of Onset  . Heart disease Mother   .  Breast cancer Maternal Aunt     dx in her 4s  . Heart disease Cousin 18    maternal cousin  . Heart attack Sister 56  . Cancer Paternal Aunt     NOS  . Cancer Maternal Aunt     NOS  . Lung cancer Cousin 73    maternal cousin - non smoker  . Brain cancer Cousin 13    maternal cousin's son  . Lung cancer Cousin 80    maternal cousin's son  . Breast cancer Cousin     paternal cousin    SOCIAL HISTORY: History  Substance Use Topics  . Smoking status: Former Smoker -- 1.00 packs/day for 15 years    Types: Cigarettes    Quit date: 07/19/1987  . Smokeless tobacco: Never Used  . Alcohol Use: No    ALLERGIES: Allergies  Allergen Reactions  . Adhesive [Tape] Rash    Clear tape gloves ,elastic no problem      MEDICATIONS:  Current Outpatient Prescriptions  Medication Sig Dispense Refill  . amLODipine (NORVASC) 10 MG tablet Take 10 mg by mouth daily.      . Azilsartan-Chlorthalidone 40-25 MG TABS Take 1 tablet by mouth daily.       [name] buPROPion (WELLBUTRIN SR) 150 MG 12 hr tablet Take 150 mg by mouth daily.       . ciprofloxacin (CIPRO) 500 MG/5ML (10%) suspension Take 5 mLs (500 mg total) by mouth 2 (two) times daily.  100 mL  0  . hydrALAZINE (APRESOLINE) 25 MG tablet Take 25 mg by mouth 2 (two) times daily.      [name] HYDROcodone-acetaminophen (NORCO/VICODIN) 5-325 MG per tablet Take 1-2 tablets by mouth every 4 (four) hours as needed.  30 tablet  0  . lidocaine-prilocaine (EMLA) cream Apply topically as needed.  30 g  0  . LORazepam (ATIVAN) 0.5 MG tablet Take 1 tablet (0.71m) every 6 hours as needed for nausea or vomiting.  30 tablet  0  . losartan (COZAAR) 100 MG tablet Take 100 mg by mouth daily.       . metoprolol succinate (TOPROL-XL) 25 MG 24 hr tablet Take 25 mg by mouth daily.      . ondansetron (ZOFRAN) 8 MG tablet Take 1 tablet (827m) every 12 hours as needed for nausea or vomiting starting the 3rd day after chemotherapy.  30 tablet  1  . pantoprazole (PROTONIX) 40 MG tablet Take 1 tablet (40 mg total) by mouth daily.  30 tablet  3  . pioglitazone-metformin (ACTOPLUS MET) 15-850 MG per tablet Take 1 tablet by mouth 2 (two) times daily with a meal.      . PRESCRIPTION MEDICATION palonosetron (ALOXI) injection 0.25 mg 0.25 mg  Once 08/02/2013 08/02/2013      Route: Intravenous      . PRESCRIPTION MEDICATION epirubicin (ELLENCE) chemo injection 200 mg 100 mg/m2  2 m2 (Order-Specific)  Once 08/02/2013      . PRESCRIPTION MEDICATION fluorouracil (ADRUCIL) chemo injection 1,000 mg 500 mg/m2  2.02 m2 (Treatment Plan Actual)  Once 08/02/2013      . PRESCRIPTION MEDICATION cyclophosphamide (CYTOXAN) 1,000 mg in sodium chloride 0.9 % 250 mL chemo infusion 500 mg/m2  2 m2 (Order-Specific)  Once  08/02/2013      . rosuvastatin (CRESTOR) 5 MG tablet Take 5 mg by mouth daily.      . sitaGLIPtin (JANUVIA) 100 MG tablet Take 100 mg by mouth daily.      . sucralfate (CARAFATE)  1 GM/10ML suspension Take 10 mLs (1 g total) by mouth 4 (four) times daily -  with meals and at bedtime.  420 mL  0  . gabapentin (NEURONTIN) 100 MG capsule Take 1 capsule (100 mg total) by mouth 3 (three) times daily.  90 capsule  6   No current facility-administered medications for this visit.   Facility-Administered Medications Ordered in Other Visits  Medication Dose Route Frequency Provider Last Rate Last Dose  . sodium chloride 0.9 % injection 10 mL  10 mL Intravenous PRN [name], MD   10 mL at 05/24/13 1522      REVIEW OF SYSTEMS: A 10 point review of systems was completed and is negative except as noted above.   PHYSICAL EXAMINATION: BP 195/80  Pulse 94  Temp(Src) 98.6 F (37 C) (Oral)  Resp 19  Ht $R'5\' 2"'Dj$  (1.575 m)  Wt 204 lb 1.6 oz (92.579 kg)  BMI 37.32 kg/m2  GENERAL: Patient is a well appearing female in no acute distress HEENT:  Sclerae anicteric.  Oropharynx clear and moist. No ulcerations or evidence of oropharyngeal candidiasis. Neck is supple.  NODES:  No cervical, supraclavicular, or axillary lymphadenopathy palpated.  BREAST EXAM:  Deferred. LUNGS:  Clear to auscultation bilaterally.  No wheezes or rhonchi. HEART:  Regular rate and rhythm. No murmur appreciated. ABDOMEN:  Soft, nontender.  Positive, normoactive bowel sounds. No organomegaly palpated. MSK:  No focal spinal tenderness to palpation. Full range of motion bilaterally in the upper extremities. EXTREMITIES:  No peripheral edema.   SKIN:  Clear with no obvious rashes or skin changes. Nails have started to become darkened at nail beds and are brittle, palms are hyperpigmented NEURO:  Nonfocal. Well oriented.  Appropriate affect. ECOG FS:  0 - Asymptomatic   LAB RESULTS: Lab Results  Component Value Date   WBC 5.8  08/23/2013   NEUTROABS 4.6 08/23/2013   HGB 9.3* 08/23/2013   HCT 28.6* 08/23/2013   MCV 94.1 08/23/2013   PLT 310 08/23/2013      Chemistry      Component Value Date/Time   NA 137 08/23/2013 0852   NA 136* 08/09/2013 0437   K 3.6 08/23/2013 0852   K 3.8 08/09/2013 0437   CL 100 08/09/2013 0437   CO2 21* 08/23/2013 0852   CO2 25 08/09/2013 0437   BUN 7.2 08/23/2013 0852   BUN 8 08/09/2013 0437   CREATININE 0.7 08/23/2013 0852   CREATININE 0.69 08/09/2013 0437      Component Value Date/Time   CALCIUM 9.2 08/23/2013 0852   CALCIUM 8.4 08/09/2013 0437   ALKPHOS 86 08/23/2013 0852   ALKPHOS 103 08/09/2013 0437   AST 11 08/23/2013 0852   AST 8 08/09/2013 0437   ALT 8 08/23/2013 0852   ALT 5 08/09/2013 0437   BILITOT 0.31 08/23/2013 0852   BILITOT 0.2* 08/09/2013 0437       No results found for this basename: [id number]    No components found with this basename: [id number]     RADIOGRAPHIC STUDIES: 1.    2-D echocardiogram on 04/18/2013 showed a left ventricular ejection fraction of 55% - 60%    ASSESSMENT/PLAN:  Kathy Maxwell is a  58 y.o. woman:  1.  Status post left breast modified radical mastectomy with left axillary lymph node resection and right breast lumpectomy with right axillary node biopsy on 04/22/2013 with pathology results as follows:  Left breast: Stage IIIA, pT3, pN2a, invasive ductal carcinoma with multiple foci, grade  1, largest span was 5.5 cm and ductal carcinoma in situ low-grade, 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% positive, progesterone receptor 23% positive, HER-2/neu no amplification, Ki-67 33%, with 4/22 metastatic lymph nodes and 1 isolated tumor cells identified within left axillary lymph node resection.  Extracapsular extension was present and surgical resection margins were negative for carcinoma.  Right breast: Stage 0, pTis, pN0, 3.0 cm ductal carcinoma in situ with calcifications, intermediate to high-grade, 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% positive, progesterone receptor 11% positive, with 0/4  metastatic right axillary lymph nodes.  Reexcision of right breast medial margin: Ductal carcinoma in situ, intermediate grade spanning 0.8 cm, ductal carcinoma in situ is focally less than 0.1 cm from the new medial margin, lobular neoplasia (atypical lobular hyperplasia).  2.  Referred for genetics counseling/testing. 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  S/P Adjuvant chemotherapy consisting of FEC (5-FU/Epirubicin/Cytoxan)  that will be followed by adjuvant chemotherapy consisting of Taxol.  2-D echocardiogram on 04/18/2013 showed a left ventricular ejection fraction of 55% - 60%.   Started adjuvant chemotherapy with FEC on 05/24/2013 - 07/23/13 X 6 CYCLES  #4 patient began adjuvant weekly Taxol starting 08/16/2013. She is now here for cycle 2 day 1 of Taxol. Total of 12 weeks as planned.  #5 anemia:  Secondary to chemotherapy. Patient is asymptomatic we will continue to monitor her.  All questions were answered.  Ms. Arrazola was encouraged to contact us in the interim with any problems, questions or concerns  I spent 15 minutes counseling the patient face to face.  The total time spent in the appointment was 30 minutes.  Marcy Panning, MD Medical/Oncology [facility] [phone] (beeper) [phone] (Office)  08/27/2013, 5:28 AM

## 2013-08-30 ENCOUNTER — Ambulatory Visit (HOSPITAL_BASED_OUTPATIENT_CLINIC_OR_DEPARTMENT_OTHER): Payer: Medicare HMO | Admitting: Oncology

## 2013-08-30 ENCOUNTER — Other Ambulatory Visit (HOSPITAL_BASED_OUTPATIENT_CLINIC_OR_DEPARTMENT_OTHER): Payer: Medicare HMO

## 2013-08-30 ENCOUNTER — Ambulatory Visit (HOSPITAL_BASED_OUTPATIENT_CLINIC_OR_DEPARTMENT_OTHER): Payer: Medicare HMO

## 2013-08-30 ENCOUNTER — Encounter: Payer: Self-pay | Admitting: Oncology

## 2013-08-30 ENCOUNTER — Other Ambulatory Visit: Payer: Self-pay | Admitting: *Deleted

## 2013-08-30 VITALS — BP 198/81 | HR 90 | Temp 97.8°F | Resp 18 | Ht 62.0 in | Wt 207.9 lb

## 2013-08-30 DIAGNOSIS — Z5111 Encounter for antineoplastic chemotherapy: Secondary | ICD-10-CM

## 2013-08-30 DIAGNOSIS — C50412 Malignant neoplasm of upper-outer quadrant of left female breast: Secondary | ICD-10-CM

## 2013-08-30 DIAGNOSIS — Z170 Estrogen receptor positive status [ER+]: Secondary | ICD-10-CM

## 2013-08-30 DIAGNOSIS — C773 Secondary and unspecified malignant neoplasm of axilla and upper limb lymph nodes: Secondary | ICD-10-CM

## 2013-08-30 DIAGNOSIS — T451X5A Adverse effect of antineoplastic and immunosuppressive drugs, initial encounter: Secondary | ICD-10-CM

## 2013-08-30 DIAGNOSIS — G5790 Unspecified mononeuropathy of unspecified lower limb: Secondary | ICD-10-CM

## 2013-08-30 DIAGNOSIS — D0590 Unspecified type of carcinoma in situ of unspecified breast: Secondary | ICD-10-CM

## 2013-08-30 DIAGNOSIS — C50419 Malignant neoplasm of upper-outer quadrant of unspecified female breast: Secondary | ICD-10-CM

## 2013-08-30 DIAGNOSIS — F319 Bipolar disorder, unspecified: Secondary | ICD-10-CM

## 2013-08-30 DIAGNOSIS — D6481 Anemia due to antineoplastic chemotherapy: Secondary | ICD-10-CM

## 2013-08-30 LAB — COMPREHENSIVE METABOLIC PANEL (CC13)
ALBUMIN: 3.5 g/dL (ref 3.5–5.0)
ALT: 9 U/L (ref 0–55)
AST: 13 U/L (ref 5–34)
Alkaline Phosphatase: 79 U/L (ref 40–150)
Anion Gap: 9 mEq/L (ref 3–11)
BUN: 9.7 mg/dL (ref 7.0–26.0)
CALCIUM: 9.5 mg/dL (ref 8.4–10.4)
CHLORIDE: 102 meq/L (ref 98–109)
CO2: 26 meq/L (ref 22–29)
Creatinine: 0.7 mg/dL (ref 0.6–1.1)
Glucose: 255 mg/dl — ABNORMAL HIGH (ref 70–140)
Potassium: 3.6 mEq/L (ref 3.5–5.1)
Sodium: 136 mEq/L (ref 136–145)
Total Bilirubin: 0.39 mg/dL (ref 0.20–1.20)
Total Protein: 5.8 g/dL — ABNORMAL LOW (ref 6.4–8.3)

## 2013-08-30 LAB — CBC WITH DIFFERENTIAL/PLATELET
BASO%: 1.3 % (ref 0.0–2.0)
BASOS ABS: 0 10*3/uL (ref 0.0–0.1)
EOS%: 3.5 % (ref 0.0–7.0)
Eosinophils Absolute: 0.1 10*3/uL (ref 0.0–0.5)
HEMATOCRIT: 28.6 % — AB (ref 34.8–46.6)
HEMOGLOBIN: 9.6 g/dL — AB (ref 11.6–15.9)
LYMPH#: 0.6 10*3/uL — AB (ref 0.9–3.3)
LYMPH%: 17.5 % (ref 14.0–49.7)
MCH: 32.2 pg (ref 25.1–34.0)
MCHC: 33.7 g/dL (ref 31.5–36.0)
MCV: 95.8 fL (ref 79.5–101.0)
MONO#: 0.5 10*3/uL (ref 0.1–0.9)
MONO%: 13.4 % (ref 0.0–14.0)
NEUT%: 64.3 % (ref 38.4–76.8)
NEUTROS ABS: 2.2 10*3/uL (ref 1.5–6.5)
Platelets: 268 10*3/uL (ref 145–400)
RBC: 2.99 10*6/uL — ABNORMAL LOW (ref 3.70–5.45)
RDW: 18 % — ABNORMAL HIGH (ref 11.2–14.5)
WBC: 3.4 10*3/uL — ABNORMAL LOW (ref 3.9–10.3)

## 2013-08-30 MED ORDER — ONDANSETRON 8 MG/NS 50 ML IVPB
INTRAVENOUS | Status: AC
  Filled 2013-08-30: qty 8

## 2013-08-30 MED ORDER — SODIUM CHLORIDE 0.9 % IV SOLN
Freq: Once | INTRAVENOUS | Status: AC
  Administered 2013-08-30: 11:00:00 via INTRAVENOUS

## 2013-08-30 MED ORDER — GABAPENTIN 100 MG PO CAPS: ORAL_CAPSULE | ORAL | Status: DC

## 2013-08-30 MED ORDER — DEXAMETHASONE SODIUM PHOSPHATE 10 MG/ML IJ SOLN
10.0000 mg | Freq: Once | INTRAMUSCULAR | Status: AC
  Administered 2013-08-30: 10 mg via INTRAVENOUS

## 2013-08-30 MED ORDER — DEXAMETHASONE SODIUM PHOSPHATE 10 MG/ML IJ SOLN
INTRAMUSCULAR | Status: AC
  Filled 2013-08-30: qty 1

## 2013-08-30 MED ORDER — LORAZEPAM 0.5 MG PO TABS: ORAL_TABLET | ORAL | Status: DC

## 2013-08-30 MED ORDER — PACLITAXEL PROTEIN-BOUND CHEMO INJECTION 100 MG
100.0000 mg/m2 | Freq: Once | INTRAVENOUS | Status: AC
  Administered 2013-08-30: 200 mg via INTRAVENOUS
  Filled 2013-08-30: qty 40

## 2013-08-30 MED ORDER — BUPROPION HCL ER (SR) 150 MG PO TB12: 150.0000 mg | ORAL_TABLET | Freq: Every day | ORAL | Status: DC

## 2013-08-30 MED ORDER — LIDOCAINE-PRILOCAINE 2.5-2.5 % EX CREA: TOPICAL_CREAM | CUTANEOUS | Status: DC | PRN

## 2013-08-30 MED ORDER — ONDANSETRON 8 MG/50ML IVPB (CHCC)
8.0000 mg | Freq: Once | INTRAVENOUS | Status: AC
  Administered 2013-08-30: 8 mg via INTRAVENOUS

## 2013-08-30 MED ORDER — SODIUM CHLORIDE 0.9 % IJ SOLN
10.0000 mL | INTRAMUSCULAR | Status: DC | PRN
Start: 2013-08-30 — End: 2013-08-30
  Administered 2013-08-30: 10 mL
  Filled 2013-08-30: qty 10

## 2013-08-30 MED ORDER — LOSARTAN POTASSIUM 100 MG PO TABS: 100.0000 mg | ORAL_TABLET | Freq: Every day | ORAL | Status: DC

## 2013-08-30 MED ORDER — HEPARIN SOD (PORK) LOCK FLUSH 100 UNIT/ML IV SOLN
500.0000 [IU] | Freq: Once | INTRAVENOUS | Status: AC | PRN
  Administered 2013-08-30: 500 [IU]
  Filled 2013-08-30: qty 5

## 2013-08-30 NOTE — Patient Instructions (Signed)
[facility] Discharge Instructions for Patients Receiving Chemotherapy  Today you received the following chemotherapy agents: abraxane  To help prevent nausea and vomiting after your treatment, we encourage you to take your nausea medication.  Take it as often as prescribed.     If you develop nausea and vomiting that is not controlled by your nausea medication, call the clinic. If it is after clinic hours your family physician or the after hours number for the clinic or go to the Emergency Department.   BELOW ARE SYMPTOMS THAT SHOULD BE REPORTED IMMEDIATELY:  *FEVER GREATER THAN 100.5 F  *CHILLS WITH OR WITHOUT FEVER  NAUSEA AND VOMITING THAT IS NOT CONTROLLED WITH YOUR NAUSEA MEDICATION  *UNUSUAL SHORTNESS OF BREATH  *UNUSUAL BRUISING OR BLEEDING  TENDERNESS IN MOUTH AND THROAT WITH OR WITHOUT PRESENCE OF ULCERS  *URINARY PROBLEMS  *BOWEL PROBLEMS  UNUSUAL RASH Items with * indicate a potential emergency and should be followed up as soon as possible.  One of the nurses will contact you 24 hours after your treatment. Please let the nurse know about any problems that you may have experienced. Feel free to call the clinic you have any questions or concerns. The clinic phone number is [phone].   I have been informed and understand all the instructions given to me. I know to contact the clinic, my physician, or go to the Emergency Department if any problems should occur. I do not have any questions at this time, but understand that I may call the clinic during office hours   should I have any questions or need assistance in obtaining follow up care.    __________________________________________  _____________  __________ Signature of Patient or Authorized Representative            Date                   Time    __________________________________________ Nurse's Signature    Nanoparticle Albumin-Bound Paclitaxel injection (Abraxane) What is this  medicine? NANOPARTICLE ALBUMIN-BOUND PACLITAXEL (Na no PAHR ti kuhl al BYOO muhn-bound PAK li TAX el) is a chemotherapy drug. It targets fast dividing cells, like cancer cells, and causes these cells to die. This medicine is used to treat advanced breast cancer and advanced lung cancer. This medicine may be used for other purposes; ask your health care provider or pharmacist if you have questions. COMMON BRAND NAME(S): Abraxane What should I tell my health care provider before I take this medicine? They need to know if you have any of these conditions: -kidney disease -liver disease -low blood counts, like low platelets, red blood cells, or white blood cells -recent or ongoing radiation therapy -an unusual or allergic reaction to paclitaxel, albumin, other chemotherapy, other medicines, foods, dyes, or preservatives -pregnant or trying to get pregnant -breast-feeding How should I use this medicine? This drug is given as an infusion into a vein. It is administered in a hospital or clinic by a specially trained health care professional. Talk to your pediatrician regarding the use of this medicine in children. Special care may be needed. Overdosage: If you think you have taken too much of this medicine contact a poison control center or emergency room at once. NOTE: This medicine is only for you. Do not share this medicine with others. What if I miss a dose? It is important not to miss your dose. Call your doctor or health care professional if you are unable to keep an appointment. What may interact  with this medicine? -cyclosporine -diazepam -ketoconazole -medicines to increase blood counts like filgrastim, pegfilgrastim, sargramostim -other chemotherapy drugs like cisplatin, doxorubicin, epirubicin, etoposide, teniposide, vincristine -quinidine -testosterone -vaccines -verapamil Talk to your doctor or health care professional before taking any of these  medicines: -acetaminophen -aspirin -ibuprofen -ketoprofen -naproxen This list may not describe all possible interactions. Give your health care provider a list of all the medicines, herbs, non-prescription drugs, or dietary supplements you use. Also tell them if you smoke, drink alcohol, or use illegal drugs. Some items may interact with your medicine. What should I watch for while using this medicine? Your condition will be monitored carefully while you are receiving this medicine. You will need important blood work done while you are taking this medicine. This drug may make you feel generally unwell. This is not uncommon, as chemotherapy can affect healthy cells as well as cancer cells. Report any side effects. Continue your course of treatment even though you feel ill unless your doctor tells you to stop. In some cases, you may be given additional medicines to help with side effects. Follow all directions for their use. Call your doctor or health care professional for advice if you get a fever, chills or sore throat, or other symptoms of a cold or flu. Do not treat yourself. This drug decreases your body's ability to fight infections. Try to avoid being around people who are sick. This medicine may increase your risk to bruise or bleed. Call your doctor or health care professional if you notice any unusual bleeding. Be careful brushing and flossing your teeth or using a toothpick because you may get an infection or bleed more easily. If you have any dental work done, tell your dentist you are receiving this medicine. Avoid taking products that contain aspirin, acetaminophen, ibuprofen, naproxen, or ketoprofen unless instructed by your doctor. These medicines may hide a fever. Do not become pregnant while taking this medicine. Women should inform their doctor if they wish to become pregnant or think they might be pregnant. There is a potential for serious side effects to an unborn child. Talk to  your health care professional or pharmacist for more information. Do not breast-feed an infant while taking this medicine. Men are advised not to father a child while receiving this medicine. What side effects may I notice from receiving this medicine? Side effects that you should report to your doctor or health care professional as soon as possible: -allergic reactions like skin rash, itching or hives, swelling of the face, lips, or tongue -low blood counts - This drug may decrease the number of white blood cells, red blood cells and platelets. You may be at increased risk for infections and bleeding. -signs of infection - fever or chills, cough, sore throat, pain or difficulty passing urine -signs of decreased platelets or bleeding - bruising, pinpoint red spots on the skin, black, tarry stools, nosebleeds -signs of decreased red blood cells - unusually weak or tired, fainting spells, lightheadedness -breathing problems -changes in vision -chest pain -high or low blood pressure -mouth sores -nausea and vomiting -pain, swelling, redness or irritation at the injection site -pain, tingling, numbness in the hands or feet -slow or irregular heartbeat -swelling of the ankle, feet, hands Side effects that usually do not require medical attention (report to your doctor or health care professional if they continue or are bothersome): -aches, pains -changes in the color of fingernails -diarrhea -hair loss -loss of appetite This list may not describe all possible side effects.  Call your doctor for medical advice about side effects. You may report side effects to FDA at 1-800-FDA-1088. Where should I keep my medicine? This drug is given in a hospital or clinic and will not be stored at home. NOTE: This sheet is a summary. It may not cover all possible information. If you have questions about this medicine, talk to your doctor, pharmacist, or health care provider.  2014, [employer].  (2012-08-27 16:48:50)

## 2013-08-30 NOTE — Patient Instructions (Signed)
Neuropathy: take neurontin 2 capsules three times a day, prescription sent to your pharmacy  Abraxane: new drug to be started instead of Taxol  Nanoparticle Albumin-Bound Paclitaxel injection What is this medicine? NANOPARTICLE ALBUMIN-BOUND PACLITAXEL (Na no PAHR ti kuhl al BYOO muhn-bound PAK li TAX el) is a chemotherapy drug. It targets fast dividing cells, like cancer cells, and causes these cells to die. This medicine is used to treat advanced breast cancer and advanced lung cancer. This medicine may be used for other purposes; ask your health care provider or pharmacist if you have questions. COMMON BRAND NAME(S): Abraxane What should I tell my health care provider before I take this medicine? They need to know if you have any of these conditions: -kidney disease -liver disease -low blood counts, like low platelets, red blood cells, or white blood cells -recent or ongoing radiation therapy -an unusual or allergic reaction to paclitaxel, albumin, other chemotherapy, other medicines, foods, dyes, or preservatives -pregnant or trying to get pregnant -breast-feeding How should I use this medicine? This drug is given as an infusion into a vein. It is administered in a hospital or clinic by a specially trained health care professional. Talk to your pediatrician regarding the use of this medicine in children. Special care may be needed. Overdosage: If you think you have taken too much of this medicine contact a poison control center or emergency room at once. NOTE: This medicine is only for you. Do not share this medicine with others. What if I miss a dose? It is important not to miss your dose. Call your doctor or health care professional if you are unable to keep an appointment. What may interact with this medicine? -cyclosporine -diazepam -ketoconazole -medicines to increase blood counts like filgrastim, pegfilgrastim, sargramostim -other chemotherapy drugs like cisplatin,  doxorubicin, epirubicin, etoposide, teniposide, vincristine -quinidine -testosterone -vaccines -verapamil Talk to your doctor or health care professional before taking any of these medicines: -acetaminophen -aspirin -ibuprofen -ketoprofen -naproxen This list may not describe all possible interactions. Give your health care provider a list of all the medicines, herbs, non-prescription drugs, or dietary supplements you use. Also tell them if you smoke, drink alcohol, or use illegal drugs. Some items may interact with your medicine. What should I watch for while using this medicine? Your condition will be monitored carefully while you are receiving this medicine. You will need important blood work done while you are taking this medicine. This drug may make you feel generally unwell. This is not uncommon, as chemotherapy can affect healthy cells as well as cancer cells. Report any side effects. Continue your course of treatment even though you feel ill unless your doctor tells you to stop. In some cases, you may be given additional medicines to help with side effects. Follow all directions for their use. Call your doctor or health care professional for advice if you get a fever, chills or sore throat, or other symptoms of a cold or flu. Do not treat yourself. This drug decreases your body's ability to fight infections. Try to avoid being around people who are sick. This medicine may increase your risk to bruise or bleed. Call your doctor or health care professional if you notice any unusual bleeding. Be careful brushing and flossing your teeth or using a toothpick because you may get an infection or bleed more easily. If you have any dental work done, tell your dentist you are receiving this medicine. Avoid taking products that contain aspirin, acetaminophen, ibuprofen, naproxen, or ketoprofen unless instructed  by your doctor. These medicines may hide a fever. Do not become pregnant while taking this  medicine. Women should inform their doctor if they wish to become pregnant or think they might be pregnant. There is a potential for serious side effects to an unborn child. Talk to your health care professional or pharmacist for more information. Do not breast-feed an infant while taking this medicine. Men are advised not to father a child while receiving this medicine. What side effects may I notice from receiving this medicine? Side effects that you should report to your doctor or health care professional as soon as possible: -allergic reactions like skin rash, itching or hives, swelling of the face, lips, or tongue -low blood counts - This drug may decrease the number of white blood cells, red blood cells and platelets. You may be at increased risk for infections and bleeding. -signs of infection - fever or chills, cough, sore throat, pain or difficulty passing urine -signs of decreased platelets or bleeding - bruising, pinpoint red spots on the skin, black, tarry stools, nosebleeds -signs of decreased red blood cells - unusually weak or tired, fainting spells, lightheadedness -breathing problems -changes in vision -chest pain -high or low blood pressure -mouth sores -nausea and vomiting -pain, swelling, redness or irritation at the injection site -pain, tingling, numbness in the hands or feet -slow or irregular heartbeat -swelling of the ankle, feet, hands Side effects that usually do not require medical attention (report to your doctor or health care professional if they continue or are bothersome): -aches, pains -changes in the color of fingernails -diarrhea -hair loss -loss of appetite This list may not describe all possible side effects. Call your doctor for medical advice about side effects. You may report side effects to FDA at 1-800-FDA-1088. Where should I keep my medicine? This drug is given in a hospital or clinic and will not be stored at home. NOTE: This sheet is a  summary. It may not cover all possible information. If you have questions about this medicine, talk to your doctor, pharmacist, or health care provider.  2014, [employer]. (2012-08-27 16:48:50)

## 2013-08-30 NOTE — Progress Notes (Signed)
[facility]  [phone] [phone]  OFFICE PROGRESS NOTE    PATIENT: Kathy Maxwell   DOB: 1955-08-15  MR#: [id number]  ZSM#:[id number]   GB:[id number] N, MD SU:  Fanny Skates, MD   DIAGNOSIS: Kathy Maxwell is a 58 y.o. 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  Treated with adjuvant chemotherapy.  Breast cancer of upper-outer quadrant of left female breast   Primary site: Breast (Left)   Staging method: AJCC 7th Edition   Pathologic: Stage IIIA (T3, N2, cM0) signed by [name], MD on 09/01/2013 11:36 PM   Summary: Stage IIIA (T3, N2, cM0)  RIGHT BREAST: stage 0 (Tis N0) DCIS  PRIOR THERAPY: 1.  Screening mammogram in 12/2012 was abnormal which led to left digital diagnostic mammogram and left breast ultrasound on 03/01/2013. Ultrasound of the left breast showed a hypoechoic mass at 12:30, 6 cm from the nipple, that measured 1.9 x 2.6 x 2.9 cm.  Ultrasound of the left axilla demonstrates a 1.1 x 0.7 x 0.9 cm lymph node with a borderline thickened cortex.  2.  Left breast needle core biopsy at the 12:30 o'clock position and left needle core biopsy of the left axillary lymph node on 03/07/2013 showed invasive mammary carcinoma with features compatible with a grade 1 or 2 ductal carcinoma in the breast needle core biopsy and lymph node was positive for metastatic mammary carcinoma with a macrometastasis and extracapsular extension identified.  Estrogen receptor 100% positive, progesterone receptor 75% positive, Ki-67 15%, HER-2/neu by CISH no amplification.  3.  Bilateral breast MRI on 03/15/2013 showed in the left breast there were at least 4 focus of abnormal spiculated enhancement in the upper left breast  centrally.  The entire area measured 7.4 cm x 6.6 cm x 5.1 cm.  The previous biopsy focus was posteriorly located and measured 2.1 x 1 x 2.1 cm.  The anterior focus that was not biopsied measured 3.1 x 2.6 x 2.6 cm.  A lateral focus that was not biopsied measured 1.1 x 1.4 x 1.6 cm.  There were small medial focus measuring cyst 0.7 x 0.8 x 0.7 cm  Post biopsy changes were identified in the left axilla.  There were abnormal enlarged lymph nodes correlating to the patient's known metastatic neoplasm to the lymph nodes which the largest lymph node measured 1.8 x 0.8 cm.  In the right breast there was an irregular focus of 3.3 x 3.1 x 1.3 cm abnormal enhancement in the central right breast in the middle one third.  There was a mild thickened cortex and prominent lymph nodes within the right axilla with the largest measuring 1.6 x 1 cm.  There was a posterior central upper breast focus with associated clip artifact measuring 1.2 x 2.2 x 1 cm previously biopsied to be benign.  Suspicious findings in bilateral breasts.  4.  Anterior left breast needle core biopsy and central right breast needle core biopsy on 04/03/2013.  Left breast needle core biopsy showed invasive ductal carcinoma and ductal carcinoma in situ, 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% positive, progesterone receptor 23% positive, Ki-67 33%, and HER-2/neu by CISH no amplification.  Right breast needle core biopsy showed ductal carcinoma in situ, 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% positive, progesterone receptor 11% positive.  5.  Status post left  breast modified radical mastectomy with left axillary lymph node resection and right breast lumpectomy with right axillary node biopsy on 04/22/2013 with pathology results as follows:  Left breast: Stage IIIA, pT3, pN2a, invasive ductal carcinoma with multiple foci, grade 1, largest span was 5.5 cm and ductal carcinoma in situ low-grade, 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% positive, progesterone receptor 23% positive, HER-2/neu no amplification,  Ki-67 33%, with 4/22 metastatic lymph nodes and 1 isolated tumor cells identified within left axillary lymph node resection.  Extracapsular extension was present and surgical resection margins were negative for carcinoma.  Right breast: Stage 0, pTis, pN0, 3.0 cm ductal carcinoma in situ with calcifications, intermediate to high-grade, 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% positive, progesterone receptor 11% positive, with 0/4 metastatic right axillary lymph nodes.  Reexcision of right breast medial margin: Ductal carcinoma in situ, intermediate grade spanning 0.8 cm, ductal carcinoma in situ is focally less than 0.1 cm from the new medial margin, lobular neoplasia (atypical lobular hyperplasia).  6.  Referred for genetics counseling and testing - negative test results but with ATM VUS  7.  Adjuvant chemotherapy consisting of FEC (5-FU/epirubicin/Cytoxan)  that will be followed by adjuvant chemotherapy consisting of Taxol.  2-D echocardiogram on 04/18/2013 showed a left ventricular ejection fraction of 55% - 60%.   Started adjuvant chemotherapy with FEC on 05/24/2013 - 07/23/13.  8. Status post 2 cycles of single agent Taxol administered from 08/16/2013 through 08/23/2013. 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  However patient tells me that fingertips are very sore. She is also having significant neuropathic type of pain in her lower extremities. She is unable to open the lid surgeon ours or button her close origins about her counts. This is very concerning. I have recommended discontinuing the Taxol. She has been on Neurontin 100 mg 3 times a day for the initial neuropathy that she was beginning to feel with Taxol. However since the neuropathy has progressed I have recommended increasing the Neurontin to 200 mg 3 times a day. She does not have any fevers chills night sweats. She's not  developed any sore throats. We discussed good handwashing and dropped let's infection control. She denies any nausea or vomiting no abdominal pain no diarrhea or constipation no bleeding problems. Remainder of the 10 point review of systems is negative.  PAST MEDICAL HISTORY: Past Medical History  Diagnosis Date  . Hyperlipidemia   . Hypertension   . Bipolar 1 disorder   . Cancer   . Hypertension 04/04/2013  . Hyperlipidemia 04/04/2013  . Bipolar 1 disorder 04/04/2013  . Breast cancer 03/10/13    left breast invasive ca  . Allergy     latex  . Obesity   . Diabetes mellitus without complication     NIDDM  . Sleep apnea     no cpap due to insurance  . Heart murmur   . GERD (gastroesophageal reflux disease)   . Coronary artery disease     PAST SURGICAL HISTORY: Past Surgical History  Procedure Laterality Date  . Cholecystectomy    . Breast biopsy Left 05/08/09    fibroadenoma with microcalcifications,no malignancy id  . Breast biopsy Left 03/07/13    invasive ca,1 lymph node metastatic  . 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 Abdominal hysterectomy    . Coronary stent placement  2001  .  Cardiac catheterization    . Eye surgery  6/13,9/14    laser,cataract lft  . Coronary angioplasty      LAD stent 2003  . Portacath placement Right 04/22/2013    Procedure: INSERTION PORT-A-CATH;  Surgeon: Adin Hector, MD;  Location: [facility];  Service: General;  Laterality: Right;  . Mastectomy modified radical Left 04/22/2013    Procedure: MASTECTOMY MODIFIED RADICAL;  Surgeon: Adin Hector, MD;  Location: [facility];  Service: General;  Laterality: Left;  . Breast lumpectomy with needle localization and axillary sentinel lymph node bx Right 04/22/2013    Procedure: BREAST LUMPECTOMY WITH NEEDLE LOCALIZATION AND AXILLARY SENTINEL LYMPH NODE BX;  Surgeon: Adin Hector, MD;  Location: [facility];  Service: General;  Laterality: Right;  .  Esophagogastroduodenoscopy N/A 08/07/2013    Procedure: ESOPHAGOGASTRODUODENOSCOPY (EGD);  Surgeon: Missy Sabins, MD;  Location: [facility];  Service: Endoscopy;  Laterality: N/A;    FAMILY HISTORY: Family History  Problem Relation Age of Onset  . Heart disease Mother   . Breast cancer Maternal Aunt     dx in her 32s  . Heart disease Cousin 31    maternal cousin  . Heart attack Sister 62  . Cancer Paternal Aunt     NOS  . Cancer Maternal Aunt     NOS  . Lung cancer Cousin 50    maternal cousin - non smoker  . Brain cancer Cousin 13    maternal cousin's son  . Lung cancer Cousin 36    maternal cousin's son  . Breast cancer Cousin     paternal cousin    SOCIAL HISTORY: History  Substance Use Topics  . Smoking status: Former Smoker -- 1.00 packs/day for 15 years    Types: Cigarettes    Quit date: 07/19/1987  . Smokeless tobacco: Never Used  . Alcohol Use: No    ALLERGIES: Allergies  Allergen Reactions  . Adhesive [Tape] Rash    Clear tape gloves ,elastic no problem     MEDICATIONS:  Current Outpatient Prescriptions  Medication Sig Dispense Refill  . amLODipine (NORVASC) 10 MG tablet Take 10 mg by mouth daily.      . Azilsartan-Chlorthalidone 40-25 MG TABS Take 1 tablet by mouth daily.       [name] buPROPion (WELLBUTRIN SR) 150 MG 12 hr tablet Take 150 mg by mouth daily.       [name] gabapentin (NEURONTIN) 100 MG capsule Take 1 capsule (100 mg total) by mouth 3 (three) times daily.  90 capsule  6  . hydrALAZINE (APRESOLINE) 25 MG tablet Take 25 mg by mouth 2 (two) times daily.      [name] HYDROcodone-acetaminophen (NORCO/VICODIN) 5-325 MG per tablet Take 1-2 tablets by mouth every 4 (four) hours as needed.  30 tablet  0  . lidocaine-prilocaine (EMLA) cream Apply topically as needed.  30 g  0  . LORazepam (ATIVAN) 0.5 MG tablet Take 1 tablet (0.$RemoveBef'5mg'uRtGsgyoNu$ ) every 6 hours as needed for nausea or vomiting.  30 tablet  0  . losartan (COZAAR) 100 MG tablet Take 100 mg by mouth daily.        . metoprolol succinate (TOPROL-XL) 25 MG 24 hr tablet Take 25 mg by mouth daily.      . ondansetron (ZOFRAN) 8 MG tablet Take 1 tablet ($RemoveB'8mg'hGsvfbJx$ s) every 12 hours as needed for nausea or vomiting starting the 3rd day after chemotherapy.  30 tablet  1  . pantoprazole (PROTONIX) 40 MG tablet Take 1 tablet (  40 mg total) by mouth daily.  30 tablet  3  . pioglitazone-metformin (ACTOPLUS MET) 15-850 MG per tablet Take 1 tablet by mouth 2 (two) times daily with a meal.      . PRESCRIPTION MEDICATION palonosetron (ALOXI) injection 0.25 mg 0.25 mg  Once 08/02/2013 08/02/2013      Route: Intravenous      . PRESCRIPTION MEDICATION epirubicin (ELLENCE) chemo injection 200 mg 100 mg/m2  2 m2 (Order-Specific)  Once 08/02/2013      . PRESCRIPTION MEDICATION fluorouracil (ADRUCIL) chemo injection 1,000 mg 500 mg/m2  2.02 m2 (Treatment Plan Actual)  Once 08/02/2013      . PRESCRIPTION MEDICATION cyclophosphamide (CYTOXAN) 1,000 mg in sodium chloride 0.9 % 250 mL chemo infusion 500 mg/m2  2 m2 (Order-Specific)  Once 08/02/2013      . rosuvastatin (CRESTOR) 5 MG tablet Take 5 mg by mouth daily.      . sitaGLIPtin (JANUVIA) 100 MG tablet Take 100 mg by mouth daily.       No current facility-administered medications for this visit.   Facility-Administered Medications Ordered in Other Visits  Medication Dose Route Frequency Provider Last Rate Last Dose  . sodium chloride 0.9 % injection 10 mL  10 mL Intravenous PRN [name], MD   10 mL at 05/24/13 1522      REVIEW OF SYSTEMS: A 10 point review of systems was completed and is negative except as noted above.   PHYSICAL EXAMINATION: BP 198/81  Pulse 90  Temp(Src) 97.8 F (36.6 C) (Oral)  Resp 18  Ht $R'5\' 2"'gz$  (1.575 m)  Wt 207 lb 14.4 oz (94.303 kg)  BMI 38.02 kg/m2  GENERAL: Patient is a well appearing female in no acute distress HEENT:  Sclerae anicteric.  Oropharynx clear and moist. No ulcerations or evidence of oropharyngeal candidiasis. Neck is supple.   NODES:  No cervical, supraclavicular, or axillary lymphadenopathy palpated.  BREAST EXAM:  Deferred. LUNGS:  Clear to auscultation bilaterally.  No wheezes or rhonchi. HEART:  Regular rate and rhythm. No murmur appreciated. ABDOMEN:  Soft, nontender.  Positive, normoactive bowel sounds. No organomegaly palpated. MSK:  No focal spinal tenderness to palpation. Full range of motion bilaterally in the upper extremities. EXTREMITIES:  No peripheral edema.   SKIN:  Clear with no obvious rashes or skin changes. Nails have started to become darkened at nail beds and are brittle, palms are hyperpigmented NEURO:  Nonfocal. Well oriented.  Appropriate affect. ECOG FS:  0 - Asymptomatic   LAB RESULTS: Lab Results  Component Value Date   WBC 3.4* 08/30/2013   NEUTROABS 2.2 08/30/2013   HGB 9.6* 08/30/2013   HCT 28.6* 08/30/2013   MCV 95.8 08/30/2013   PLT 268 08/30/2013      Chemistry      Component Value Date/Time   NA 137 08/23/2013 0852   NA 136* 08/09/2013 0437   K 3.6 08/23/2013 0852   K 3.8 08/09/2013 0437   CL 100 08/09/2013 0437   CO2 21* 08/23/2013 0852   CO2 25 08/09/2013 0437   BUN 7.2 08/23/2013 0852   BUN 8 08/09/2013 0437   CREATININE 0.7 08/23/2013 0852   CREATININE 0.69 08/09/2013 0437      Component Value Date/Time   CALCIUM 9.2 08/23/2013 0852   CALCIUM 8.4 08/09/2013 0437   ALKPHOS 86 08/23/2013 0852   ALKPHOS 103 08/09/2013 0437   AST 11 08/23/2013 0852   AST 8 08/09/2013 0437   ALT 8 08/23/2013 8110  ALT 5 08/09/2013 0437   BILITOT 0.31 08/23/2013 0852   BILITOT 0.2* 08/09/2013 0437       No results found for this basename: [id number]    No components found with this basename: [id number]     RADIOGRAPHIC STUDIES: 1.    2-D echocardiogram on 04/18/2013 showed a left ventricular ejection fraction of 55% - 60%    ASSESSMENT/PLAN:  Kathy Maxwell is a  58 y.o. woman:  1.  Status post left breast modified radical mastectomy with left axillary lymph node resection and right breast  lumpectomy with right axillary node biopsy on 04/22/2013 with pathology results as follows:  Left breast: Stage IIIA, pT3, pN2a, invasive ductal carcinoma with multiple foci, grade 1, largest span was 5.5 cm and ductal carcinoma in situ low-grade, 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% positive, progesterone receptor 23% positive, HER-2/neu no amplification, Ki-67 33%, with 4/22 metastatic lymph nodes and 1 isolated tumor cells identified within left axillary lymph node resection.  Extracapsular extension was present and surgical resection margins were negative for carcinoma.  Right breast: Stage 0, pTis, pN0, 3.0 cm ductal carcinoma in situ with calcifications, intermediate to high-grade, 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% positive, progesterone receptor 11% positive, with 0/4 metastatic right axillary lymph nodes.  Reexcision of right breast medial margin: Ductal carcinoma in situ, intermediate grade spanning 0.8 cm, ductal carcinoma in situ is focally less than 0.1 cm from the new medial margin, lobular neoplasia (atypical lobular hyperplasia).  2.  Referred for genetics counseling/testing. 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  S/P Adjuvant chemotherapy consisting of FEC (5-FU/Epirubicin/Cytoxan)  that will be followed by adjuvant chemotherapy consisting of Taxol.  2-D echocardiogram on 04/18/2013 showed a left ventricular ejection fraction of 55% - 60%.   Started adjuvant chemotherapy with FEC on 05/24/2013 - 07/23/13 X 6 CYCLES  #4 patient is status post 2 cycles of Taxol last 1 administered on 08/23/2013. This is discontinued due to to significant the grade 3 with neuropathy.  #5 patient will begin Abraxane day 1 day 8 day 15 on a 28 day cycle starting 08/30/2013. We will obtain a preauthorization first.  #5 neuropathy: I have recommended patient continue super B complex. I have also increased her gabapentin to 2 capsules (200 mg) 3 times a day.  #6 anemia:  Secondary to  chemotherapy. Patient is asymptomatic we will continue to monitor her.  #7 patient will be seen back in one week for cycle 1 day 8 of Abraxane.  All questions were answered.  Ms. Sabas was encouraged to contact us in the interim with any problems, questions or concerns  I spent 25 minutes counseling the patient face to face.  The total time spent in the appointment was 30 minutes.  Marcy Panning, MD Medical/Oncology [facility] [phone] (beeper) [phone] (Office)  08/30/2013, 9:19 AM

## 2013-09-03 ENCOUNTER — Telehealth: Payer: Self-pay | Admitting: *Deleted

## 2013-09-03 NOTE — Telephone Encounter (Signed)
Called Kathy Maxwell for chemotherapy F/U.  Patient is doing well.  Denies n/v.  Denies any new side effects or symptoms.  Bowel and bladder is functioning well.  Eating and drinking well and I instructed to drink 64 oz minimum daily or at least the day before, of and after treatment.  Does report pain she was having to feet from taxol is now numbness to IV an V or pinky toe.  "Blister to left toe in area of numbness but it went away."  Discussed wearing round/square toe box shoes to prevent rubbing the areas that are numb.  Keep feet clean and moisturized.  Denies questions at this time and encouraged to call if needed.  Reviewed how to call after hours in the case of an emergency.

## 2013-09-03 NOTE — Telephone Encounter (Signed)
Message copied by Cherylynn Ridges on Tue Sep 03, 2013  2:48 PM ------      Message from: P[place] Bells      Created: Fri Aug 30, 2013 11:26 AM      Regarding: New chemo regimen      Contact: [phone]       abraxane ------

## 2013-09-03 NOTE — Telephone Encounter (Signed)
Per note left on my desk and POF I have change appts.  jmw

## 2013-09-05 ENCOUNTER — Telehealth: Payer: Self-pay | Admitting: *Deleted

## 2013-09-05 NOTE — Telephone Encounter (Signed)
Received call from patient that she feels "achy" and her right hand, especially the thumb, is swollen. Patient denies coughing, fever, nausea, vomiting and diarrhea. She is out of town and will return tonight on the train. I told her to take Tylenol for the pain. Also, if possible, do not use the right arm and to keep the arm elevated. Informed patient to keep her scheduled appointments for tomorrow to see the MD. Patient verbalized understanding.

## 2013-09-06 ENCOUNTER — Other Ambulatory Visit (HOSPITAL_BASED_OUTPATIENT_CLINIC_OR_DEPARTMENT_OTHER): Payer: Medicare HMO

## 2013-09-06 ENCOUNTER — Encounter (INDEPENDENT_AMBULATORY_CARE_PROVIDER_SITE_OTHER): Payer: Self-pay

## 2013-09-06 ENCOUNTER — Ambulatory Visit: Payer: Medicare HMO

## 2013-09-06 ENCOUNTER — Encounter: Payer: Self-pay | Admitting: Adult Health

## 2013-09-06 ENCOUNTER — Ambulatory Visit (HOSPITAL_BASED_OUTPATIENT_CLINIC_OR_DEPARTMENT_OTHER): Payer: Medicare HMO | Admitting: Adult Health

## 2013-09-06 VITALS — BP 155/76 | HR 108 | Temp 98.2°F | Resp 20 | Ht 62.0 in | Wt 198.1 lb

## 2013-09-06 DIAGNOSIS — C773 Secondary and unspecified malignant neoplasm of axilla and upper limb lymph nodes: Secondary | ICD-10-CM

## 2013-09-06 DIAGNOSIS — C50119 Malignant neoplasm of central portion of unspecified female breast: Secondary | ICD-10-CM

## 2013-09-06 DIAGNOSIS — C50412 Malignant neoplasm of upper-outer quadrant of left female breast: Secondary | ICD-10-CM

## 2013-09-06 DIAGNOSIS — G589 Mononeuropathy, unspecified: Secondary | ICD-10-CM

## 2013-09-06 DIAGNOSIS — T451X5A Adverse effect of antineoplastic and immunosuppressive drugs, initial encounter: Secondary | ICD-10-CM

## 2013-09-06 DIAGNOSIS — D6481 Anemia due to antineoplastic chemotherapy: Secondary | ICD-10-CM

## 2013-09-06 DIAGNOSIS — D0590 Unspecified type of carcinoma in situ of unspecified breast: Secondary | ICD-10-CM

## 2013-09-06 DIAGNOSIS — Z170 Estrogen receptor positive status [ER+]: Secondary | ICD-10-CM

## 2013-09-06 LAB — CBC WITH DIFFERENTIAL/PLATELET
BASO%: 0.4 % (ref 0.0–2.0)
Basophils Absolute: 0 10*3/uL (ref 0.0–0.1)
EOS%: 5 % (ref 0.0–7.0)
Eosinophils Absolute: 0.1 10*3/uL (ref 0.0–0.5)
HEMATOCRIT: 30.6 % — AB (ref 34.8–46.6)
HGB: 10 g/dL — ABNORMAL LOW (ref 11.6–15.9)
LYMPH%: 25.1 % (ref 14.0–49.7)
MCH: 31.2 pg (ref 25.1–34.0)
MCHC: 32.7 g/dL (ref 31.5–36.0)
MCV: 95.3 fL (ref 79.5–101.0)
MONO#: 0.3 10*3/uL (ref 0.1–0.9)
MONO%: 10.8 % (ref 0.0–14.0)
NEUT#: 1.5 10*3/uL (ref 1.5–6.5)
NEUT%: 58.7 % (ref 38.4–76.8)
NRBC: 0 % (ref 0–0)
Platelets: 304 10*3/uL (ref 145–400)
RBC: 3.21 10*6/uL — AB (ref 3.70–5.45)
RDW: 15.9 % — ABNORMAL HIGH (ref 11.2–14.5)
WBC: 2.6 10*3/uL — ABNORMAL LOW (ref 3.9–10.3)
lymph#: 0.7 10*3/uL — ABNORMAL LOW (ref 0.9–3.3)

## 2013-09-06 LAB — COMPREHENSIVE METABOLIC PANEL (CC13)
ALBUMIN: 3.5 g/dL (ref 3.5–5.0)
ALT: 10 U/L (ref 0–55)
AST: 13 U/L (ref 5–34)
Alkaline Phosphatase: 85 U/L (ref 40–150)
Anion Gap: 8 mEq/L (ref 3–11)
BUN: 14.4 mg/dL (ref 7.0–26.0)
CHLORIDE: 99 meq/L (ref 98–109)
CO2: 26 mEq/L (ref 22–29)
Calcium: 9.8 mg/dL (ref 8.4–10.4)
Creatinine: 1 mg/dL (ref 0.6–1.1)
Glucose: 307 mg/dl — ABNORMAL HIGH (ref 70–140)
POTASSIUM: 4.1 meq/L (ref 3.5–5.1)
SODIUM: 133 meq/L — AB (ref 136–145)
TOTAL PROTEIN: 6.1 g/dL — AB (ref 6.4–8.3)
Total Bilirubin: 0.53 mg/dL (ref 0.20–1.20)

## 2013-09-06 LAB — TECHNOLOGIST REVIEW

## 2013-09-06 MED ORDER — DEXAMETHASONE SODIUM PHOSPHATE 10 MG/ML IJ SOLN
INTRAMUSCULAR | Status: AC
  Filled 2013-09-06: qty 1

## 2013-09-06 MED ORDER — ONDANSETRON 8 MG/NS 50 ML IVPB
INTRAVENOUS | Status: AC
  Filled 2013-09-06: qty 8

## 2013-09-06 NOTE — Progress Notes (Addendum)
[place]  Telephone:([phone]) [phone] Fax:(336) [phone]  OFFICE PROGRESS NOTE    PATIENT: LATIANA Maxwell   DOB: June 14, 1956  MR#: [id number]  TIW#:[id number]   SN:[id number],HALPF N, MD SU:  Fanny Skates, MD   DIAGNOSIS: Kathy Maxwell is a 58 y.o. 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  Treated with adjuvant chemotherapy.  Breast cancer of upper-outer quadrant of left female breast   Primary site: Breast (Left)   Staging method: AJCC 7th Edition   Pathologic: Stage IIIA (T3, N2, cM0) signed by [name], MD on 09/01/2013 11:36 PM   Summary: Stage IIIA (T3, N2, cM0)  RIGHT BREAST: stage 0 (Tis N0) DCIS  PRIOR THERAPY: 1.  Screening mammogram in 12/2012 was abnormal which led to left digital diagnostic mammogram and left breast ultrasound on 03/01/2013. Ultrasound of the left breast showed a hypoechoic mass at 12:30, 6 cm from the nipple, that measured 1.9 x 2.6 x 2.9 cm.  Ultrasound of the left axilla demonstrates a 1.1 x 0.7 x 0.9 cm lymph node with a borderline thickened cortex.  2.  Left breast needle core biopsy at the 12:30 o'clock position and left needle core biopsy of the left axillary lymph node on 03/07/2013 showed invasive mammary carcinoma with features compatible with a grade 1 or 2 ductal carcinoma in the breast needle core biopsy and lymph node was positive for metastatic mammary carcinoma with a macrometastasis and extracapsular extension identified.  Estrogen receptor 100% positive, progesterone receptor 75% positive, Ki-67 15%, HER-2/neu by CISH no amplification.  3.  Bilateral breast MRI on 03/15/2013 showed in the left breast there were at least 4 focus of abnormal spiculated enhancement in the upper left breast  centrally.  The entire area measured 7.4 cm x 6.6 cm x 5.1 cm.  The previous biopsy focus was posteriorly located and measured 2.1 x 1 x 2.1 cm.  The anterior focus that was not biopsied measured 3.1 x 2.6 x 2.6 cm.  A lateral focus that was not biopsied measured 1.1 x 1.4 x 1.6 cm.  There were small medial focus measuring cyst 0.7 x 0.8 x 0.7 cm  Post biopsy changes were identified in the left axilla.  There were abnormal enlarged lymph nodes correlating to the patient's known metastatic neoplasm to the lymph nodes which the largest lymph node measured 1.8 x 0.8 cm.  In the right breast there was an irregular focus of 3.3 x 3.1 x 1.3 cm abnormal enhancement in the central right breast in the middle one third.  There was a mild thickened cortex and prominent lymph nodes within the right axilla with the largest measuring 1.6 x 1 cm.  There was a posterior central upper breast focus with associated clip artifact measuring 1.2 x 2.2 x 1 cm previously biopsied to be benign.  Suspicious findings in bilateral breasts.  4.  Anterior left breast needle core biopsy and central right breast needle core biopsy on 04/03/2013.  Left breast needle core biopsy showed invasive ductal carcinoma and ductal carcinoma in situ, 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% positive, progesterone receptor 23% positive, Ki-67 33%, and HER-2/neu by CISH no amplification.  Right breast needle core biopsy showed ductal carcinoma in situ, 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% positive, progesterone receptor 11% positive.  5.  Status post left  breast modified radical mastectomy with left axillary lymph node resection and right breast lumpectomy with right axillary node biopsy on 04/22/2013 with pathology results as follows:  Left breast: Stage IIIA, pT3, pN2a, invasive ductal carcinoma with multiple foci, grade 1, largest span was 5.5 cm and ductal carcinoma in situ low-grade, 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% positive, progesterone receptor 23% positive, HER-2/neu no amplification,  Ki-67 33%, with 4/22 metastatic lymph nodes and 1 isolated tumor cells identified within left axillary lymph node resection.  Extracapsular extension was present and surgical resection margins were negative for carcinoma.  Right breast: Stage 0, pTis, pN0, 3.0 cm ductal carcinoma in situ with calcifications, intermediate to high-grade, 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% positive, progesterone receptor 11% positive, with 0/4 metastatic right axillary lymph nodes.  Reexcision of right breast medial margin: Ductal carcinoma in situ, intermediate grade spanning 0.8 cm, ductal carcinoma in situ is focally less than 0.1 cm from the new medial margin, lobular neoplasia (atypical lobular hyperplasia).  6.  Referred for genetics counseling and testing - negative test results but with ATM VUS  7.  Adjuvant chemotherapy consisting of FEC (5-FU/epirubicin/Cytoxan)  that will be followed by adjuvant chemotherapy consisting of Taxol.  2-D echocardiogram on 04/18/2013 showed a left ventricular ejection fraction of 55% - 60%.   Started adjuvant chemotherapy with FEC on 05/24/2013 - 07/23/13.  8. Status post 2 cycles of single agent Taxol administered from 08/16/2013 through 08/23/2013. 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  CURRENT THERAPY:  Cycle 1 day 8 of Abraxane on a 28 day cycle  INTERVAL HISTORY: [name] is here for evaluation prior to receiving chemotherapy.  Currently she is receiving Abraxane, 100mg /m2 on day 1, day 8, and day 15 of a 28 day cycle.  She continues to have neuropathy.  She is taking Gabapentin, 200mg  three times per dayshe feels like it is slightly better.  She is however experiencing a swollen right thumb at her fingernail. She says it feels like its going to pop, and that it is tender, and warm and red.  She denies fevers, chills, change in sensation, or any other concerns.    PAST MEDICAL HISTORY: Past Medical History  Diagnosis Date   . Hyperlipidemia   . Hypertension   . Bipolar 1 disorder   . Cancer   . Hypertension 04/04/2013  . Hyperlipidemia 04/04/2013  . Bipolar 1 disorder 04/04/2013  . Breast cancer 03/10/13    left breast invasive ca  . Allergy     latex  . Obesity   . Diabetes mellitus without complication     NIDDM  . Sleep apnea     no cpap due to insurance  . Heart murmur   . GERD (gastroesophageal reflux disease)   . Coronary artery disease     PAST SURGICAL HISTORY: Past Surgical History  Procedure Laterality Date  . Cholecystectomy    . Breast biopsy Left 05/08/09    fibroadenoma with microcalcifications,no malignancy id  . Breast biopsy Left 03/07/13    invasive ca,1 lymph node metastatic  . 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 Abdominal hysterectomy    . Coronary stent placement  2001  . Cardiac catheterization    . Eye surgery  6/13,9/14    laser,cataract lft  . Coronary angioplasty      LAD stent 2003  . Portacath placement Right 04/22/2013    Procedure: INSERTION PORT-A-CATH;  Surgeon: Edsel Petrin  Dalbert Batman, MD;  Location: [facility];  Service: General;  Laterality: Right;  . Mastectomy modified radical Left 04/22/2013    Procedure: MASTECTOMY MODIFIED RADICAL;  Surgeon: Adin Hector, MD;  Location: [facility];  Service: General;  Laterality: Left;  . Breast lumpectomy with needle localization and axillary sentinel lymph node bx Right 04/22/2013    Procedure: BREAST LUMPECTOMY WITH NEEDLE LOCALIZATION AND AXILLARY SENTINEL LYMPH NODE BX;  Surgeon: Adin Hector, MD;  Location: [facility];  Service: General;  Laterality: Right;  . Esophagogastroduodenoscopy N/A 08/07/2013    Procedure: ESOPHAGOGASTRODUODENOSCOPY (EGD);  Surgeon: Missy Sabins, MD;  Location: [facility];  Service: Endoscopy;  Laterality: N/A;    FAMILY HISTORY: Family History  Problem Relation Age of Onset  . Heart disease Mother   . Breast cancer Maternal Aunt     dx in her  65s  . Heart disease Cousin 74    maternal cousin  . Heart attack Sister 56  . Cancer Paternal Aunt     NOS  . Cancer Maternal Aunt     NOS  . Lung cancer Cousin 67    maternal cousin - non smoker  . Brain cancer Cousin 13    maternal cousin's son  . Lung cancer Cousin 46    maternal cousin's son  . Breast cancer Cousin     paternal cousin    SOCIAL HISTORY: History  Substance Use Topics  . Smoking status: Former Smoker -- 1.00 packs/day for 15 years    Types: Cigarettes    Quit date: 07/19/1987  . Smokeless tobacco: Never Used  . Alcohol Use: No    ALLERGIES: Allergies  Allergen Reactions  . Adhesive [Tape] Rash    Clear tape gloves ,elastic no problem     MEDICATIONS:  Current Outpatient Prescriptions  Medication Sig Dispense Refill  . amLODipine (NORVASC) 10 MG tablet Take 10 mg by mouth daily.      . Azilsartan-Chlorthalidone 40-25 MG TABS Take 1 tablet by mouth daily.       [name] buPROPion (WELLBUTRIN SR) 150 MG 12 hr tablet Take 1 tablet (150 mg total) by mouth daily.  30 tablet  6  . gabapentin (NEURONTIN) 100 MG capsule Tale 2 capsules three times a day  180 capsule  6  . hydrALAZINE (APRESOLINE) 25 MG tablet Take 25 mg by mouth 2 (two) times daily.      [name] HYDROcodone-acetaminophen (NORCO/VICODIN) 5-325 MG per tablet Take 1-2 tablets by mouth every 4 (four) hours as needed.  30 tablet  0  . lidocaine-prilocaine (EMLA) cream Apply topically as needed. Apply to port-a-cath one - two hours before accessing.  30 g  3  . LORazepam (ATIVAN) 0.5 MG tablet Take 1 tablet (0.$RemoveBef'5mg'VrxPCZKqum$ ) every 6 hours as needed for nausea or vomiting.  30 tablet  2  . losartan (COZAAR) 100 MG tablet Take 1 tablet (100 mg total) by mouth daily.  30 tablet  6  . metoprolol succinate (TOPROL-XL) 25 MG 24 hr tablet Take 25 mg by mouth daily.      . pantoprazole (PROTONIX) 40 MG tablet Take 1 tablet (40 mg total) by mouth daily.  30 tablet  3  . pioglitazone-metformin (ACTOPLUS MET) 15-850 MG per  tablet Take 1 tablet by mouth 2 (two) times daily with a meal.      . PRESCRIPTION MEDICATION palonosetron (ALOXI) injection 0.25 mg 0.25 mg  Once 08/02/2013 08/02/2013      Route: Intravenous      .  PRESCRIPTION MEDICATION epirubicin (ELLENCE) chemo injection 200 mg 100 mg/m2  2 m2 (Order-Specific)  Once 08/02/2013      . PRESCRIPTION MEDICATION fluorouracil (ADRUCIL) chemo injection 1,000 mg 500 mg/m2  2.02 m2 (Treatment Plan Actual)  Once 08/02/2013      . PRESCRIPTION MEDICATION cyclophosphamide (CYTOXAN) 1,000 mg in sodium chloride 0.9 % 250 mL chemo infusion 500 mg/m2  2 m2 (Order-Specific)  Once 08/02/2013      . rosuvastatin (CRESTOR) 5 MG tablet Take 5 mg by mouth daily.      . sitaGLIPtin (JANUVIA) 100 MG tablet Take 100 mg by mouth daily.      . ondansetron (ZOFRAN) 8 MG tablet Take 1 tablet ($RemoveB'8mg'QtcDlhAX$ s) every 12 hours as needed for nausea or vomiting starting the 3rd day after chemotherapy.  30 tablet  1   No current facility-administered medications for this visit.   Facility-Administered Medications Ordered in Other Visits  Medication Dose Route Frequency Provider Last Rate Last Dose  . sodium chloride 0.9 % injection 10 mL  10 mL Intravenous PRN [name], MD   10 mL at 05/24/13 1522      REVIEW OF SYSTEMS: A 10 point review of systems was completed and is negative except as noted above.   PHYSICAL EXAMINATION: BP 155/76  Pulse 108  Temp(Src) 98.2 F (36.8 C) (Oral)  Resp 20  Ht $R'5\' 2"'oH$  (1.575 m)  Wt 198 lb 1.6 oz (89.858 kg)  BMI 36.22 kg/m2  GENERAL: Patient is a well appearing female in no acute distress HEENT:  Sclerae anicteric.  Oropharynx clear and moist. No ulcerations or evidence of oropharyngeal candidiasis. Neck is supple.  NODES:  No cervical, supraclavicular, or axillary lymphadenopathy palpated.  BREAST EXAM:  Deferred. LUNGS:  Clear to auscultation bilaterally.  No wheezes or rhonchi. HEART:  Regular rate and rhythm. No murmur appreciated. ABDOMEN:  Soft,  nontender.  Positive, normoactive bowel sounds. No organomegaly palpated. MSK:  No focal spinal tenderness to palpation. Full range of motion bilaterally in the upper extremities. EXTREMITIES:  No peripheral edema.   SKIN:  Clear with no obvious rashes or skin changes. Nails have started to become darkened at nail beds and are brittle, palms are hyperpigmented.  Right thumb is swollen, warm, and slightly erythematous.  There is separation from the left lateral nail bed and erythema and purulent appearing separation.   NEURO:  Nonfocal. Well oriented.  Appropriate affect. ECOG FS:  0 - Asymptomatic   LAB RESULTS: Lab Results  Component Value Date   WBC 2.6* 09/06/2013   NEUTROABS 1.5 09/06/2013   HGB 10.0* 09/06/2013   HCT 30.6* 09/06/2013   MCV 95.3 09/06/2013   PLT 304 09/06/2013      Chemistry      Component Value Date/Time   NA 136 08/30/2013 0846   NA 136* 08/09/2013 0437   K 3.6 08/30/2013 0846   K 3.8 08/09/2013 0437   CL 100 08/09/2013 0437   CO2 26 08/30/2013 0846   CO2 25 08/09/2013 0437   BUN 9.7 08/30/2013 0846   BUN 8 08/09/2013 0437   CREATININE 0.7 08/30/2013 0846   CREATININE 0.69 08/09/2013 0437      Component Value Date/Time   CALCIUM 9.5 08/30/2013 0846   CALCIUM 8.4 08/09/2013 0437   ALKPHOS 79 08/30/2013 0846   ALKPHOS 103 08/09/2013 0437   AST 13 08/30/2013 0846   AST 8 08/09/2013 0437   ALT 9 08/30/2013 0846   ALT 5 08/09/2013 0437  BILITOT 0.39 08/30/2013 0846   BILITOT 0.2* 08/09/2013 0437       No results found for this basename: [id number]    No components found with this basename: [id number]     RADIOGRAPHIC STUDIES: 1.    2-D echocardiogram on 04/18/2013 showed a left ventricular ejection fraction of 55% - 60%    ASSESSMENT/PLAN:  OSA FOGARTY is a  58 y.o. woman:  1.  Status post left breast modified radical mastectomy with left axillary lymph node resection and right breast lumpectomy with right axillary node biopsy on 04/22/2013 with pathology results  as follows:  Left breast: Stage IIIA, pT3, pN2a, invasive ductal carcinoma with multiple foci, grade 1, largest span was 5.5 cm and ductal carcinoma in situ low-grade, 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% positive, progesterone receptor 23% positive, HER-2/neu no amplification, Ki-67 33%, with 4/22 metastatic lymph nodes and 1 isolated tumor cells identified within left axillary lymph node resection.  Extracapsular extension was present and surgical resection margins were negative for carcinoma.  Right breast: Stage 0, pTis, pN0, 3.0 cm ductal carcinoma in situ with calcifications, intermediate to high-grade, 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% positive, progesterone receptor 11% positive, with 0/4 metastatic right axillary lymph nodes.  Reexcision of right breast medial margin: Ductal carcinoma in situ, intermediate grade spanning 0.8 cm, ductal carcinoma in situ is focally less than 0.1 cm from the new medial margin, lobular neoplasia (atypical lobular hyperplasia).  2.  Referred for genetics counseling/testing. 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  S/P Adjuvant chemotherapy consisting of FEC (5-FU/Epirubicin/Cytoxan)  that will be followed by adjuvant chemotherapy consisting of Taxol.  2-D echocardiogram on 04/18/2013 showed a left ventricular ejection fraction of 55% - 60%.   Started adjuvant chemotherapy with FEC on 05/24/2013 - 07/23/13 X 6 CYCLES  #4 patient is status post 2 cycles of Taxol last 1 administered on 08/23/2013. This is discontinued due to to significant the grade 3 with neuropathy.  #5 Patient is due for cycle 1 day 8 of Abraxane therapy today.  She will not receive chemotherapy today due to the appearance of an acute infection in her right thumb.  I am very concerned about the degree of pain and swelling in her thumb.  I am attempting to get her an appointment with [facility] orthopedics.  If unsuccessful, I will start antibiotics with Keflex and image her thumb to eval  for bone involvement.  She may need to go to the emergency room.    #5 neuropathy: I have recommended patient continue super B complex. I have also increased her gabapentin to 3 capsules (300 mg) 3 times a day.  #6 anemia:  Secondary to chemotherapy. Patient is asymptomatic we will continue to monitor her.  #7 patient will be seen back in one week for cycle 1 day 8 of Abraxane.  All questions were answered.  Ms. Catalano was encouraged to contact us in the interim with any problems, questions or concerns  I spent 25 minutes counseling the patient face to face.  The total time spent in the appointment was 30 minutes.  Minette Headland, [place] [phone] 09/06/2013, 9:23 AM  ATTENDING'S ATTESTATION:  I personally reviewed patient's chart, examined patient myself, formulated the treatment plan as followed.    Patient with possible cellulitis of the right index finger. 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 She is now off of taxol.  We will begin her on empiric antibiotics  and have her see hand surgeon. We will not give her scheduled chemotherapy today.  She will return in 1 week for follow up.  Marcy Panning, MD Medical/Oncology [facility] [phone] (beeper) [phone] (Office)  09/08/2013, 6:27 PM

## 2013-09-09 ENCOUNTER — Telehealth: Payer: Self-pay

## 2013-09-09 NOTE — Telephone Encounter (Signed)
Pt called - reports 3 nails are are lifting and inquiring if s/e of chemo.  Confirmed it is side effect of chemo.  She voiced understanding.

## 2013-09-13 ENCOUNTER — Ambulatory Visit (HOSPITAL_BASED_OUTPATIENT_CLINIC_OR_DEPARTMENT_OTHER): Payer: Medicare HMO

## 2013-09-13 ENCOUNTER — Other Ambulatory Visit (HOSPITAL_BASED_OUTPATIENT_CLINIC_OR_DEPARTMENT_OTHER): Payer: Medicare HMO

## 2013-09-13 ENCOUNTER — Ambulatory Visit (HOSPITAL_BASED_OUTPATIENT_CLINIC_OR_DEPARTMENT_OTHER): Payer: Medicare HMO | Admitting: Adult Health

## 2013-09-13 ENCOUNTER — Encounter: Payer: Self-pay | Admitting: Adult Health

## 2013-09-13 VITALS — BP 146/69 | HR 98 | Temp 98.2°F | Resp 18 | Ht 62.0 in | Wt 201.8 lb

## 2013-09-13 DIAGNOSIS — G629 Polyneuropathy, unspecified: Secondary | ICD-10-CM

## 2013-09-13 DIAGNOSIS — D6481 Anemia due to antineoplastic chemotherapy: Secondary | ICD-10-CM

## 2013-09-13 DIAGNOSIS — T451X5A Adverse effect of antineoplastic and immunosuppressive drugs, initial encounter: Secondary | ICD-10-CM

## 2013-09-13 DIAGNOSIS — D0590 Unspecified type of carcinoma in situ of unspecified breast: Secondary | ICD-10-CM

## 2013-09-13 DIAGNOSIS — C773 Secondary and unspecified malignant neoplasm of axilla and upper limb lymph nodes: Secondary | ICD-10-CM

## 2013-09-13 DIAGNOSIS — C50419 Malignant neoplasm of upper-outer quadrant of unspecified female breast: Secondary | ICD-10-CM

## 2013-09-13 DIAGNOSIS — C50412 Malignant neoplasm of upper-outer quadrant of left female breast: Secondary | ICD-10-CM

## 2013-09-13 DIAGNOSIS — Z170 Estrogen receptor positive status [ER+]: Secondary | ICD-10-CM

## 2013-09-13 DIAGNOSIS — Z5111 Encounter for antineoplastic chemotherapy: Secondary | ICD-10-CM

## 2013-09-13 DIAGNOSIS — G609 Hereditary and idiopathic neuropathy, unspecified: Secondary | ICD-10-CM

## 2013-09-13 LAB — COMPREHENSIVE METABOLIC PANEL (CC13)
ALK PHOS: 87 U/L (ref 40–150)
ALT: 11 U/L (ref 0–55)
ANION GAP: 10 meq/L (ref 3–11)
AST: 13 U/L (ref 5–34)
Albumin: 3.5 g/dL (ref 3.5–5.0)
BILIRUBIN TOTAL: 0.3 mg/dL (ref 0.20–1.20)
BUN: 22.5 mg/dL (ref 7.0–26.0)
CO2: 22 mEq/L (ref 22–29)
CREATININE: 1 mg/dL (ref 0.6–1.1)
Calcium: 9.3 mg/dL (ref 8.4–10.4)
Chloride: 101 mEq/L (ref 98–109)
GLUCOSE: 364 mg/dL — AB (ref 70–140)
Potassium: 3.9 mEq/L (ref 3.5–5.1)
SODIUM: 133 meq/L — AB (ref 136–145)
TOTAL PROTEIN: 6.2 g/dL — AB (ref 6.4–8.3)

## 2013-09-13 LAB — CBC WITH DIFFERENTIAL/PLATELET
BASO%: 0.3 % (ref 0.0–2.0)
Basophils Absolute: 0 10*3/uL (ref 0.0–0.1)
EOS ABS: 0.1 10*3/uL (ref 0.0–0.5)
EOS%: 0.9 % (ref 0.0–7.0)
HEMATOCRIT: 32.9 % — AB (ref 34.8–46.6)
HGB: 10.7 g/dL — ABNORMAL LOW (ref 11.6–15.9)
LYMPH%: 13.3 % — AB (ref 14.0–49.7)
MCH: 31.5 pg (ref 25.1–34.0)
MCHC: 32.4 g/dL (ref 31.5–36.0)
MCV: 97 fL (ref 79.5–101.0)
MONO#: 0.8 10*3/uL (ref 0.1–0.9)
MONO%: 12.2 % (ref 0.0–14.0)
NEUT#: 5.1 10*3/uL (ref 1.5–6.5)
NEUT%: 73.3 % (ref 38.4–76.8)
PLATELETS: 321 10*3/uL (ref 145–400)
RBC: 3.39 10*6/uL — ABNORMAL LOW (ref 3.70–5.45)
RDW: 17.4 % — ABNORMAL HIGH (ref 11.2–14.5)
WBC: 6.9 10*3/uL (ref 3.9–10.3)
lymph#: 0.9 10*3/uL (ref 0.9–3.3)

## 2013-09-13 MED ORDER — DEXAMETHASONE SODIUM PHOSPHATE 10 MG/ML IJ SOLN
10.0000 mg | Freq: Once | INTRAMUSCULAR | Status: AC
  Administered 2013-09-13: 10 mg via INTRAVENOUS

## 2013-09-13 MED ORDER — HEPARIN SOD (PORK) LOCK FLUSH 100 UNIT/ML IV SOLN
500.0000 [IU] | Freq: Once | INTRAVENOUS | Status: AC | PRN
  Administered 2013-09-13: 500 [IU]
  Filled 2013-09-13: qty 5

## 2013-09-13 MED ORDER — ONDANSETRON 8 MG/NS 50 ML IVPB
INTRAVENOUS | Status: AC
  Filled 2013-09-13: qty 8

## 2013-09-13 MED ORDER — SODIUM CHLORIDE 0.9 % IV SOLN
Freq: Once | INTRAVENOUS | Status: AC
  Administered 2013-09-13: 10:00:00 via INTRAVENOUS

## 2013-09-13 MED ORDER — GABAPENTIN 100 MG PO CAPS: 300.0000 mg | ORAL_CAPSULE | Freq: Three times a day (TID) | ORAL | Status: DC

## 2013-09-13 MED ORDER — ONDANSETRON 8 MG/50ML IVPB (CHCC)
8.0000 mg | Freq: Once | INTRAVENOUS | Status: AC
  Administered 2013-09-13: 8 mg via INTRAVENOUS

## 2013-09-13 MED ORDER — SODIUM CHLORIDE 0.9 % IJ SOLN
10.0000 mL | INTRAMUSCULAR | Status: DC | PRN
  Administered 2013-09-13: 10 mL
  Filled 2013-09-13: qty 10

## 2013-09-13 MED ORDER — PACLITAXEL PROTEIN-BOUND CHEMO INJECTION 100 MG
100.0000 mg/m2 | Freq: Once | INTRAVENOUS | Status: AC
Start: 2013-09-13 — End: 2013-09-13
  Administered 2013-09-13: 200 mg via INTRAVENOUS
  Filled 2013-09-13: qty 40

## 2013-09-13 MED ORDER — DEXAMETHASONE 4 MG PO TABS: 4.0000 mg | ORAL_TABLET | ORAL | Status: DC

## 2013-09-13 MED ORDER — DEXAMETHASONE SODIUM PHOSPHATE 10 MG/ML IJ SOLN
INTRAMUSCULAR | Status: AC
  Filled 2013-09-13: qty 1

## 2013-09-13 NOTE — Patient Instructions (Signed)
[facility] Discharge Instructions for Patients Receiving Chemotherapy  Today you received the following chemotherapy agents Abraxane  To help prevent nausea and vomiting after your treatment, we encourage you to take your nausea medication as prescribed.   If you develop nausea and vomiting that is not controlled by your nausea medication, call the clinic.   BELOW ARE SYMPTOMS THAT SHOULD BE REPORTED IMMEDIATELY:  *FEVER GREATER THAN 100.5 F  *CHILLS WITH OR WITHOUT FEVER  NAUSEA AND VOMITING THAT IS NOT CONTROLLED WITH YOUR NAUSEA MEDICATION  *UNUSUAL SHORTNESS OF BREATH  *UNUSUAL BRUISING OR BLEEDING  TENDERNESS IN MOUTH AND THROAT WITH OR WITHOUT PRESENCE OF ULCERS  *URINARY PROBLEMS  *BOWEL PROBLEMS  UNUSUAL RASH Items with * indicate a potential emergency and should be followed up as soon as possible.  Feel free to call the clinic you have any questions or concerns. The clinic phone number is [phone].    

## 2013-09-13 NOTE — Progress Notes (Signed)
[place]  [phone] [phone]  OFFICE PROGRESS NOTE    PATIENT: Kathy Maxwell   DOB: 11-19-1955  MR#: [id number]  DXI#:[id number]   JQ:[id number],TKWIO N, MD SU:  Fanny Skates, MD   DIAGNOSIS: Kathy Maxwell is a 58 y.o. 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  Treated with adjuvant chemotherapy.  Breast cancer of upper-outer quadrant of left female breast   Primary site: Breast (Left)   Staging method: AJCC 7th Edition   Pathologic: Stage IIIA (T3, N2, cM0) signed by [name], MD on 09/01/2013 11:36 PM   Summary: Stage IIIA (T3, N2, cM0)  RIGHT BREAST: stage 0 (Tis N0) DCIS  PRIOR THERAPY: 1.  Screening mammogram in 12/2012 was abnormal which led to left digital diagnostic mammogram and left breast ultrasound on 03/01/2013. Ultrasound of the left breast showed a hypoechoic mass at 12:30, 6 cm from the nipple, that measured 1.9 x 2.6 x 2.9 cm.  Ultrasound of the left axilla demonstrates a 1.1 x 0.7 x 0.9 cm lymph node with a borderline thickened cortex.  2.  Left breast needle core biopsy at the 12:30 o'clock position and left needle core biopsy of the left axillary lymph node on 03/07/2013 showed invasive mammary carcinoma with features compatible with a grade 1 or 2 ductal carcinoma in the breast needle core biopsy and lymph node was positive for metastatic mammary carcinoma with a macrometastasis and extracapsular extension identified.  Estrogen receptor 100% positive, progesterone receptor 75% positive, Ki-67 15%, HER-2/neu by CISH no amplification.  3.  Bilateral breast MRI on 03/15/2013 showed in the left breast there were at least 4 focus of abnormal spiculated enhancement in the upper left breast  centrally.  The entire area measured 7.4 cm x 6.6 cm x 5.1 cm.  The previous biopsy focus was posteriorly located and measured 2.1 x 1 x 2.1 cm.  The anterior focus that was not biopsied measured 3.1 x 2.6 x 2.6 cm.  A lateral focus that was not biopsied measured 1.1 x 1.4 x 1.6 cm.  There were small medial focus measuring cyst 0.7 x 0.8 x 0.7 cm  Post biopsy changes were identified in the left axilla.  There were abnormal enlarged lymph nodes correlating to the patient's known metastatic neoplasm to the lymph nodes which the largest lymph node measured 1.8 x 0.8 cm.  In the right breast there was an irregular focus of 3.3 x 3.1 x 1.3 cm abnormal enhancement in the central right breast in the middle one third.  There was a mild thickened cortex and prominent lymph nodes within the right axilla with the largest measuring 1.6 x 1 cm.  There was a posterior central upper breast focus with associated clip artifact measuring 1.2 x 2.2 x 1 cm previously biopsied to be benign.  Suspicious findings in bilateral breasts.  4.  Anterior left breast needle core biopsy and central right breast needle core biopsy on 04/03/2013.  Left breast needle core biopsy showed invasive ductal carcinoma and ductal carcinoma in situ, 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% positive, progesterone receptor 23% positive, Ki-67 33%, and HER-2/neu by CISH no amplification.  Right breast needle core biopsy showed ductal carcinoma in situ, 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% positive, progesterone receptor 11% positive.  5.  Status post left  breast modified radical mastectomy with left axillary lymph node resection and right breast lumpectomy with right axillary node biopsy on 04/22/2013 with pathology results as follows:  Left breast: Stage IIIA, pT3, pN2a, invasive ductal carcinoma with multiple foci, grade 1, largest span was 5.5 cm and ductal carcinoma in situ low-grade, 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% positive, progesterone receptor 23% positive, HER-2/neu no amplification,  Ki-67 33%, with 4/22 metastatic lymph nodes and 1 isolated tumor cells identified within left axillary lymph node resection.  Extracapsular extension was present and surgical resection margins were negative for carcinoma.  Right breast: Stage 0, pTis, pN0, 3.0 cm ductal carcinoma in situ with calcifications, intermediate to high-grade, 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% positive, progesterone receptor 11% positive, with 0/4 metastatic right axillary lymph nodes.  Reexcision of right breast medial margin: Ductal carcinoma in situ, intermediate grade spanning 0.8 cm, ductal carcinoma in situ is focally less than 0.1 cm from the new medial margin, lobular neoplasia (atypical lobular hyperplasia).  6.  Referred for genetics counseling and testing - negative test results but with ATM VUS  7.  Adjuvant chemotherapy consisting of FEC (5-FU/epirubicin/Cytoxan)  that will be followed by adjuvant chemotherapy consisting of Taxol.  2-D echocardiogram on 04/18/2013 showed a left ventricular ejection fraction of 55% - 60%.   Started adjuvant chemotherapy with FEC on 05/24/2013 - 07/23/13.  8. Status post 2 cycles of single agent Taxol administered from 08/16/2013 through 08/23/2013. 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  CURRENT THERAPY:  Cycle 1 day 8 of Abraxane on a 28 day cycle  INTERVAL HISTORY: Kathy Maxwell is here for evaluation prior to receiving chemotherapy.  Currently she is receiving Abraxane, 100mg /m2 on day 1, day 8, and day 15 of a 28 day cycle.  She continues to have neuropathy.  She is taking Gabapentin, 200mg  three times per dayshe feels like it is slightly better.  Her chemotherapy was held last week due to possible left thumb cellulitis.  She was evaluated by [facility] and it was determined that is was not an infection and it was wrapped.  She has f/u with them on Monday.  The thumb is improved and doing well.  She denies fevers,  chills, pain, nausea, vomiting, constipation, diarrhea, or any other concerns.    PAST MEDICAL HISTORY: Past Medical History  Diagnosis Date  . Hyperlipidemia   . Hypertension   . Bipolar 1 disorder   . Cancer   . Hypertension 04/04/2013  . Hyperlipidemia 04/04/2013  . Bipolar 1 disorder 04/04/2013  . Breast cancer 03/10/13    left breast invasive ca  . Allergy     latex  . Obesity   . Diabetes mellitus without complication     NIDDM  . Sleep apnea     no cpap due to insurance  . Heart murmur   . GERD (gastroesophageal reflux disease)   . Coronary artery disease     PAST SURGICAL HISTORY: Past Surgical History  Procedure Laterality Date  . Cholecystectomy    . Breast biopsy Left 05/08/09    fibroadenoma with microcalcifications,no malignancy id  . Breast biopsy Left 03/07/13    invasive ca,1 lymph node metastatic  . 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 Abdominal hysterectomy    . Coronary stent placement  2001  . Cardiac catheterization    . Eye surgery  6/13,9/14    laser,cataract lft  . Coronary angioplasty  LAD stent 2003  . Portacath placement Right 04/22/2013    Procedure: INSERTION PORT-A-CATH;  Surgeon: Adin Hector, MD;  Location: [place];  Service: General;  Laterality: Right;  . Mastectomy modified radical Left 04/22/2013    Procedure: MASTECTOMY MODIFIED RADICAL;  Surgeon: Adin Hector, MD;  Location: [place];  Service: General;  Laterality: Left;  . Breast lumpectomy with needle localization and axillary sentinel lymph node bx Right 04/22/2013    Procedure: BREAST LUMPECTOMY WITH NEEDLE LOCALIZATION AND AXILLARY SENTINEL LYMPH NODE BX;  Surgeon: Adin Hector, MD;  Location: [place];  Service: General;  Laterality: Right;  . Esophagogastroduodenoscopy N/A 08/07/2013    Procedure: ESOPHAGOGASTRODUODENOSCOPY (EGD);  Surgeon: Missy Sabins, MD;  Location: [facility];  Service: Endoscopy;  Laterality:  N/A;    FAMILY HISTORY: Family History  Problem Relation Age of Onset  . Heart disease Mother   . Breast cancer Maternal Aunt     dx in her 71s  . Heart disease Cousin 8    maternal cousin  . Heart attack Sister 23  . Cancer Paternal Aunt     NOS  . Cancer Maternal Aunt     NOS  . Lung cancer Cousin 80    maternal cousin - non smoker  . Brain cancer Cousin 13    maternal cousin's son  . Lung cancer Cousin 37    maternal cousin's son  . Breast cancer Cousin     paternal cousin    SOCIAL HISTORY: History  Substance Use Topics  . Smoking status: Former Smoker -- 1.00 packs/day for 15 years    Types: Cigarettes    Quit date: 07/19/1987  . Smokeless tobacco: Never Used  . Alcohol Use: No    ALLERGIES: Allergies  Allergen Reactions  . Adhesive [Tape] Rash    Clear tape gloves ,elastic no problem     MEDICATIONS:  Current Outpatient Prescriptions  Medication Sig Dispense Refill  . amLODipine (NORVASC) 10 MG tablet Take 10 mg by mouth daily.      . Azilsartan-Chlorthalidone 40-25 MG TABS Take 1 tablet by mouth daily.       [name] buPROPion (WELLBUTRIN SR) 150 MG 12 hr tablet Take 1 tablet (150 mg total) by mouth daily.  30 tablet  6  . gabapentin (NEURONTIN) 100 MG capsule Take 3 capsules (300 mg total) by mouth 3 (three) times daily.  870 capsule  6  . hydrALAZINE (APRESOLINE) 25 MG tablet Take 25 mg by mouth 2 (two) times daily.      [name] HYDROcodone-acetaminophen (NORCO/VICODIN) 5-325 MG per tablet Take 1-2 tablets by mouth every 4 (four) hours as needed.  30 tablet  0  . lidocaine-prilocaine (EMLA) cream Apply topically as needed. Apply to port-a-cath one - two hours before accessing.  30 g  3  . LORazepam (ATIVAN) 0.5 MG tablet Take 1 tablet (0.$RemoveBef'5mg'UGhKlQDYVo$ ) every 6 hours as needed for nausea or vomiting.  30 tablet  2  . losartan (COZAAR) 100 MG tablet Take 1 tablet (100 mg total) by mouth daily.  30 tablet  6  . metoprolol succinate (TOPROL-XL) 25 MG 24 hr tablet Take 25 mg by  mouth daily.      . ondansetron (ZOFRAN) 8 MG tablet Take 1 tablet ($RemoveB'8mg'SigcpcOz$ s) every 12 hours as needed for nausea or vomiting starting the 3rd day after chemotherapy.  30 tablet  1  . pantoprazole (PROTONIX) 40 MG tablet Take 1 tablet (40 mg total) by mouth daily.  30 tablet  3  . pioglitazone-metformin (ACTOPLUS MET) 15-850 MG per tablet Take 1 tablet by mouth 2 (two) times daily with a meal.      . PRESCRIPTION MEDICATION palonosetron (ALOXI) injection 0.25 mg 0.25 mg  Once 08/02/2013 08/02/2013      Route: Intravenous      . PRESCRIPTION MEDICATION epirubicin (ELLENCE) chemo injection 200 mg 100 mg/m2  2 m2 (Order-Specific)  Once 08/02/2013      . PRESCRIPTION MEDICATION fluorouracil (ADRUCIL) chemo injection 1,000 mg 500 mg/m2  2.02 m2 (Treatment Plan Actual)  Once 08/02/2013      . PRESCRIPTION MEDICATION cyclophosphamide (CYTOXAN) 1,000 mg in sodium chloride 0.9 % 250 mL chemo infusion 500 mg/m2  2 m2 (Order-Specific)  Once 08/02/2013      . rosuvastatin (CRESTOR) 5 MG tablet Take 5 mg by mouth daily.      . sitaGLIPtin (JANUVIA) 100 MG tablet Take 100 mg by mouth daily.      [name] dexamethasone (DECADRON) 4 MG tablet Take 1 tablet (4 mg total) by mouth as directed. Take 2 tablets once daily for three days starting the day after chemotherapy  60 tablet  0   No current facility-administered medications for this visit.   Facility-Administered Medications Ordered in Other Visits  Medication Dose Route Frequency Provider Last Rate Last Dose  . sodium chloride 0.9 % injection 10 mL  10 mL Intravenous PRN [name], MD   10 mL at 05/24/13 1522      REVIEW OF SYSTEMS: A 10 point review of systems was completed and is negative except as noted above.   PHYSICAL EXAMINATION: BP 146/69  Pulse 98  Temp(Src) 98.2 F (36.8 C) (Oral)  Resp 18  Ht $R'5\' 2"'SK$  (1.575 m)  Wt 201 lb 12.8 oz (91.536 kg)  BMI 36.90 kg/m2  GENERAL: Patient is a well appearing female in no acute distress HEENT:  Sclerae  anicteric.  Oropharynx clear and moist. No ulcerations or evidence of oropharyngeal candidiasis. Neck is supple.  NODES:  No cervical, supraclavicular, or axillary lymphadenopathy palpated.  BREAST EXAM:  Deferred. LUNGS:  Clear to auscultation bilaterally.  No wheezes or rhonchi. HEART:  Regular rate and rhythm. No murmur appreciated. ABDOMEN:  Soft, nontender.  Positive, normoactive bowel sounds. No organomegaly palpated. MSK:  No focal spinal tenderness to palpation. Full range of motion bilaterally in the upper extremities. EXTREMITIES:  No peripheral edema.   SKIN:  Clear with no obvious rashes or skin changes. Nails have started to become darkened at nail beds and are brittle, palms are hyperpigmented. Right thumb is wrapped.   NEURO:  Nonfocal. Well oriented.  Appropriate affect. ECOG FS:  0 - Asymptomatic   LAB RESULTS: Lab Results  Component Value Date   WBC 6.9 09/13/2013   NEUTROABS 5.1 09/13/2013   HGB 10.7* 09/13/2013   HCT 32.9* 09/13/2013   MCV 97.0 09/13/2013   PLT 321 09/13/2013      Chemistry      Component Value Date/Time   NA 133* 09/13/2013 0853   NA 136* 08/09/2013 0437   K 3.9 09/13/2013 0853   K 3.8 08/09/2013 0437   CL 100 08/09/2013 0437   CO2 22 09/13/2013 0853   CO2 25 08/09/2013 0437   BUN 22.5 09/13/2013 0853   BUN 8 08/09/2013 0437   CREATININE 1.0 09/13/2013 0853   CREATININE 0.69 08/09/2013 0437      Component Value Date/Time   CALCIUM 9.3 09/13/2013 0853   CALCIUM 8.4 08/09/2013 0437  ALKPHOS 87 09/13/2013 0853   ALKPHOS 103 08/09/2013 0437   AST 13 09/13/2013 0853   AST 8 08/09/2013 0437   ALT 11 09/13/2013 0853   ALT 5 08/09/2013 0437   BILITOT 0.30 09/13/2013 0853   BILITOT 0.2* 08/09/2013 0437       No results found for this basename: [id number]    No components found with this basename: [id number]     RADIOGRAPHIC STUDIES: 1.    2-D echocardiogram on 04/18/2013 showed a left ventricular ejection fraction of 55% - 60%    ASSESSMENT/PLAN:  Kathy Maxwell is a  58 y.o. woman:  1.  Status post left breast modified radical mastectomy with left axillary lymph node resection and right breast lumpectomy with right axillary node biopsy on 04/22/2013 with pathology results as follows:  Left breast: Stage IIIA, pT3, pN2a, invasive ductal carcinoma with multiple foci, grade 1, largest span was 5.5 cm and ductal carcinoma in situ low-grade, 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% positive, progesterone receptor 23% positive, HER-2/neu no amplification, Ki-67 33%, with 4/22 metastatic lymph nodes and 1 isolated tumor cells identified within left axillary lymph node resection.  Extracapsular extension was present and surgical resection margins were negative for carcinoma.  Right breast: Stage 0, pTis, pN0, 3.0 cm ductal carcinoma in situ with calcifications, intermediate to high-grade, 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% positive, progesterone receptor 11% positive, with 0/4 metastatic right axillary lymph nodes.  Reexcision of right breast medial margin: Ductal carcinoma in situ, intermediate grade spanning 0.8 cm, ductal carcinoma in situ is focally less than 0.1 cm from the new medial margin, lobular neoplasia (atypical lobular hyperplasia).  2.  Referred for genetics counseling/testing. 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  S/P Adjuvant chemotherapy consisting of FEC (5-FU/Epirubicin/Cytoxan)  that will be followed by adjuvant chemotherapy consisting of Taxol.  2-D echocardiogram on 04/18/2013 showed a left ventricular ejection fraction of 55% - 60%.   Started adjuvant chemotherapy with FEC on 05/24/2013 - 07/23/13 X 6 CYCLES  #4 patient is status post 2 cycles of Taxol last 1 administered on 08/23/2013. This is discontinued due to to significant the grade 3 with neuropathy.  #5 Patient is due for cycle 1 day 8 of Abraxane therapy today.  This was held last week due to her thumb.  It is much improved and she will [facility] up with [facility].  She will proceed with chemotherapy.    #5 neuropathy: I have recommended patient continue super B complex. I have also increased her gabapentin to 3 capsules (300 mg) 3 times a day.  #6 anemia:  Secondary to chemotherapy. Patient is asymptomatic we will continue to monitor her.  #7 patient will be seen back in one week for cycle 1 day 15 of Abraxane.  All questions were answered.  Ms. Malstrom was encouraged to contact us in the interim with any problems, questions or concerns  I spent 25 minutes counseling the patient face to face.  The total time spent in the appointment was 30 minutes.  Minette Headland, [place] [phone] 09/15/2013, 9:24 AM

## 2013-09-20 ENCOUNTER — Ambulatory Visit: Payer: Medicare HMO

## 2013-09-20 ENCOUNTER — Telehealth: Payer: Self-pay | Admitting: Oncology

## 2013-09-20 ENCOUNTER — Encounter (INDEPENDENT_AMBULATORY_CARE_PROVIDER_SITE_OTHER): Payer: Self-pay

## 2013-09-20 ENCOUNTER — Telehealth: Payer: Self-pay | Admitting: *Deleted

## 2013-09-20 ENCOUNTER — Ambulatory Visit (HOSPITAL_BASED_OUTPATIENT_CLINIC_OR_DEPARTMENT_OTHER): Payer: Medicare HMO | Admitting: Oncology

## 2013-09-20 ENCOUNTER — Other Ambulatory Visit (HOSPITAL_BASED_OUTPATIENT_CLINIC_OR_DEPARTMENT_OTHER): Payer: Medicare HMO

## 2013-09-20 ENCOUNTER — Ambulatory Visit (HOSPITAL_BASED_OUTPATIENT_CLINIC_OR_DEPARTMENT_OTHER): Payer: Medicare HMO

## 2013-09-20 VITALS — BP 171/83 | HR 94 | Temp 97.9°F | Resp 18 | Ht 62.0 in | Wt 199.8 lb

## 2013-09-20 DIAGNOSIS — D0590 Unspecified type of carcinoma in situ of unspecified breast: Secondary | ICD-10-CM

## 2013-09-20 DIAGNOSIS — Z170 Estrogen receptor positive status [ER+]: Secondary | ICD-10-CM

## 2013-09-20 DIAGNOSIS — G589 Mononeuropathy, unspecified: Secondary | ICD-10-CM

## 2013-09-20 DIAGNOSIS — C50412 Malignant neoplasm of upper-outer quadrant of left female breast: Secondary | ICD-10-CM

## 2013-09-20 DIAGNOSIS — T451X5A Adverse effect of antineoplastic and immunosuppressive drugs, initial encounter: Secondary | ICD-10-CM

## 2013-09-20 DIAGNOSIS — D6481 Anemia due to antineoplastic chemotherapy: Secondary | ICD-10-CM

## 2013-09-20 DIAGNOSIS — C773 Secondary and unspecified malignant neoplasm of axilla and upper limb lymph nodes: Secondary | ICD-10-CM

## 2013-09-20 DIAGNOSIS — C50419 Malignant neoplasm of upper-outer quadrant of unspecified female breast: Secondary | ICD-10-CM

## 2013-09-20 DIAGNOSIS — Z5111 Encounter for antineoplastic chemotherapy: Secondary | ICD-10-CM

## 2013-09-20 LAB — CBC WITH DIFFERENTIAL/PLATELET
BASO%: 0.9 % (ref 0.0–2.0)
Basophils Absolute: 0.1 10*3/uL (ref 0.0–0.1)
EOS%: 1.9 % (ref 0.0–7.0)
Eosinophils Absolute: 0.1 10*3/uL (ref 0.0–0.5)
HCT: 32.2 % — ABNORMAL LOW (ref 34.8–46.6)
HGB: 10.8 g/dL — ABNORMAL LOW (ref 11.6–15.9)
LYMPH%: 12.8 % — ABNORMAL LOW (ref 14.0–49.7)
MCH: 32.2 pg (ref 25.1–34.0)
MCHC: 33.5 g/dL (ref 31.5–36.0)
MCV: 96 fL (ref 79.5–101.0)
MONO#: 0.4 10*3/uL (ref 0.1–0.9)
MONO%: 5.7 % (ref 0.0–14.0)
NEUT#: 5.9 10*3/uL (ref 1.5–6.5)
NEUT%: 78.7 % — ABNORMAL HIGH (ref 38.4–76.8)
Platelets: 250 10*3/uL (ref 145–400)
RBC: 3.35 10*6/uL — ABNORMAL LOW (ref 3.70–5.45)
RDW: 16 % — AB (ref 11.2–14.5)
WBC: 7.5 10*3/uL (ref 3.9–10.3)
lymph#: 1 10*3/uL (ref 0.9–3.3)

## 2013-09-20 LAB — COMPREHENSIVE METABOLIC PANEL (CC13)
ALK PHOS: 88 U/L (ref 40–150)
ALT: 15 U/L (ref 0–55)
AST: 13 U/L (ref 5–34)
Albumin: 3.6 g/dL (ref 3.5–5.0)
Anion Gap: 7 mEq/L (ref 3–11)
BUN: 13.3 mg/dL (ref 7.0–26.0)
CALCIUM: 9.5 mg/dL (ref 8.4–10.4)
CHLORIDE: 101 meq/L (ref 98–109)
CO2: 26 mEq/L (ref 22–29)
CREATININE: 0.8 mg/dL (ref 0.6–1.1)
Glucose: 343 mg/dl — ABNORMAL HIGH (ref 70–140)
POTASSIUM: 4.2 meq/L (ref 3.5–5.1)
Sodium: 134 mEq/L — ABNORMAL LOW (ref 136–145)
Total Bilirubin: 0.29 mg/dL (ref 0.20–1.20)
Total Protein: 6.3 g/dL — ABNORMAL LOW (ref 6.4–8.3)

## 2013-09-20 MED ORDER — DEXAMETHASONE SODIUM PHOSPHATE 10 MG/ML IJ SOLN
10.0000 mg | Freq: Once | INTRAMUSCULAR | Status: AC
  Administered 2013-09-20: 10 mg via INTRAVENOUS

## 2013-09-20 MED ORDER — SODIUM CHLORIDE 0.9 % IJ SOLN
10.0000 mL | INTRAMUSCULAR | Status: DC | PRN
  Administered 2013-09-20: 10 mL
  Filled 2013-09-20: qty 10

## 2013-09-20 MED ORDER — SODIUM CHLORIDE 0.9 % IV SOLN
Freq: Once | INTRAVENOUS | Status: AC
  Administered 2013-09-20: 11:00:00 via INTRAVENOUS

## 2013-09-20 MED ORDER — ONDANSETRON 8 MG/50ML IVPB (CHCC)
8.0000 mg | Freq: Once | INTRAVENOUS | Status: AC
  Administered 2013-09-20: 8 mg via INTRAVENOUS

## 2013-09-20 MED ORDER — PACLITAXEL PROTEIN-BOUND CHEMO INJECTION 100 MG
100.0000 mg/m2 | Freq: Once | INTRAVENOUS | Status: AC
  Administered 2013-09-20: 200 mg via INTRAVENOUS
  Filled 2013-09-20: qty 40

## 2013-09-20 MED ORDER — HEPARIN SOD (PORK) LOCK FLUSH 100 UNIT/ML IV SOLN
500.0000 [IU] | Freq: Once | INTRAVENOUS | Status: AC | PRN
  Administered 2013-09-20: 500 [IU]
  Filled 2013-09-20: qty 5

## 2013-09-20 NOTE — Telephone Encounter (Signed)
Per staff phone call and POF I have schedueld appts.  JMW  

## 2013-09-20 NOTE — Patient Instructions (Signed)
[facility] Discharge Instructions for Patients Receiving Chemotherapy  Today you received the following chemotherapy agents Abraxane  To help prevent nausea and vomiting after your treatment, we encourage you to take your nausea medication as prescribed.   If you develop nausea and vomiting that is not controlled by your nausea medication, call the clinic.   BELOW ARE SYMPTOMS THAT SHOULD BE REPORTED IMMEDIATELY:  *FEVER GREATER THAN 100.5 F  *CHILLS WITH OR WITHOUT FEVER  NAUSEA AND VOMITING THAT IS NOT CONTROLLED WITH YOUR NAUSEA MEDICATION  *UNUSUAL SHORTNESS OF BREATH  *UNUSUAL BRUISING OR BLEEDING  TENDERNESS IN MOUTH AND THROAT WITH OR WITHOUT PRESENCE OF ULCERS  *URINARY PROBLEMS  *BOWEL PROBLEMS  UNUSUAL RASH Items with * indicate a potential emergency and should be followed up as soon as possible.  Feel free to call the clinic you have any questions or concerns. The clinic phone number is [phone].    

## 2013-09-20 NOTE — Telephone Encounter (Signed)
, °

## 2013-09-24 ENCOUNTER — Encounter: Payer: Self-pay | Admitting: Oncology

## 2013-09-24 NOTE — Progress Notes (Signed)
[place]  [phone]) [phone] Fax:(336) [phone]  OFFICE PROGRESS NOTE    PATIENT: Kathy Maxwell   DOB: 1955-12-25  MR#: [id number]  JIR#:[id number]   BP:ZWCHENI,DPOEU N, MD SU:  Fanny Skates, MD   DIAGNOSIS: Kathy Maxwell is a 58 y.o. 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  Treated with adjuvant chemotherapy.  Breast cancer of upper-outer quadrant of left female breast   Primary site: Breast (Left)   Staging method: AJCC 7th Edition   Pathologic: Stage IIIA (T3, N2, cM0) signed by [name], MD on 09/01/2013 11:36 PM   Summary: Stage IIIA (T3, N2, cM0)  RIGHT BREAST: stage 0 (Tis N0) DCIS  PRIOR THERAPY: 1.  Screening mammogram in 12/2012 was abnormal which led to left digital diagnostic mammogram and left breast ultrasound on 03/01/2013. Ultrasound of the left breast showed a hypoechoic mass at 12:30, 6 cm from the nipple, that measured 1.9 x 2.6 x 2.9 cm.  Ultrasound of the left axilla demonstrates a 1.1 x 0.7 x 0.9 cm lymph node with a borderline thickened cortex.  2.  Left breast needle core biopsy at the 12:30 o'clock position and left needle core biopsy of the left axillary lymph node on 03/07/2013 showed invasive mammary carcinoma with features compatible with a grade 1 or 2 ductal carcinoma in the breast needle core biopsy and lymph node was positive for metastatic mammary carcinoma with a macrometastasis and extracapsular extension identified.  Estrogen receptor 100% positive, progesterone receptor 75% positive, Ki-67 15%, HER-2/neu by CISH no amplification.  3.  Bilateral breast MRI on 03/15/2013 showed in the left breast there were at least 4 focus of abnormal spiculated enhancement in the upper left breast  centrally.  The entire area measured 7.4 cm x 6.6 cm x 5.1 cm.  The previous biopsy focus was posteriorly located and measured 2.1 x 1 x 2.1 cm.  The anterior focus that was not biopsied measured 3.1 x 2.6 x 2.6 cm.  A lateral focus that was not biopsied measured 1.1 x 1.4 x 1.6 cm.  There were small medial focus measuring cyst 0.7 x 0.8 x 0.7 cm  Post biopsy changes were identified in the left axilla.  There were abnormal enlarged lymph nodes correlating to the patient's known metastatic neoplasm to the lymph nodes which the largest lymph node measured 1.8 x 0.8 cm.  In the right breast there was an irregular focus of 3.3 x 3.1 x 1.3 cm abnormal enhancement in the central right breast in the middle one third.  There was a mild thickened cortex and prominent lymph nodes within the right axilla with the largest measuring 1.6 x 1 cm.  There was a posterior central upper breast focus with associated clip artifact measuring 1.2 x 2.2 x 1 cm previously biopsied to be benign.  Suspicious findings in bilateral breasts.  4.  Anterior left breast needle core biopsy and central right breast needle core biopsy on 04/03/2013.  Left breast needle core biopsy showed invasive ductal carcinoma and ductal carcinoma in situ, 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% positive, progesterone receptor 23% positive, Ki-67 33%, and HER-2/neu by CISH no amplification.  Right breast needle core biopsy showed ductal carcinoma in situ, 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% positive, progesterone receptor 11% positive.  5.  Status post left  breast modified radical mastectomy with left axillary lymph node resection and right breast lumpectomy with right axillary node biopsy on 04/22/2013 with pathology results as follows:  Left breast: Stage IIIA, pT3, pN2a, invasive ductal carcinoma with multiple foci, grade 1, largest span was 5.5 cm and ductal carcinoma in situ low-grade, 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% positive, progesterone receptor 23% positive, HER-2/neu no amplification,  Ki-67 33%, with 4/22 metastatic lymph nodes and 1 isolated tumor cells identified within left axillary lymph node resection.  Extracapsular extension was present and surgical resection margins were negative for carcinoma.  Right breast: Stage 0, pTis, pN0, 3.0 cm ductal carcinoma in situ with calcifications, intermediate to high-grade, 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% positive, progesterone receptor 11% positive, with 0/4 metastatic right axillary lymph nodes.  Reexcision of right breast medial margin: Ductal carcinoma in situ, intermediate grade spanning 0.8 cm, ductal carcinoma in situ is focally less than 0.1 cm from the new medial margin, lobular neoplasia (atypical lobular hyperplasia).  6.  Referred for genetics counseling and testing - negative test results but with ATM VUS  7.  Adjuvant chemotherapy consisting of FEC (5-FU/epirubicin/Cytoxan)  that will be followed by adjuvant chemotherapy consisting of Taxol.  2-D echocardiogram on 04/18/2013 showed a left ventricular ejection fraction of 55% - 60%.   Started adjuvant chemotherapy with FEC on 05/24/2013 - 07/23/13.  8. Status post 2 cycles of single agent Taxol administered from 08/16/2013 through 08/23/2013. 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  CURRENT THERAPY:  Cycle 1 day 15 of Abraxane on a 28 day cycle  INTERVAL HISTORY: Kathy Maxwell is here for evaluation prior to receiving chemotherapy.  Currently she is receiving Abraxane, 173m/m2 on day 1, day 8, and day 15 of a 28 day cycle.  She continues to have neuropathy.  She is taking Gabapentin, 2052mthree times per dayshe feels like it is slightly better.  Her chemotherapy was held last week due to possible left thumb cellulitis.  [facility] that is was not an infection and it was wrapped.  She has f/u with them on Monday.  The thumb is improved and doing well.  She denies fevers,  chills, pain, nausea, vomiting, constipation, diarrhea, or any other concerns.  Today she denies any headaches double vision blurring of vision fevers chills night sweats. No shortness of breath chest pains palpitations. No abdominal pain no diarrhea or constipation. She has no easy bruising or bleeding. She has no myalgias and arthralgias. No peripheral paresthesias or gait disturbances. Remainder of the 10 point review of systems is negative.   PAST MEDICAL HISTORY: Past Medical History  Diagnosis Date  . Hyperlipidemia   . Hypertension   . Bipolar 1 disorder   . Cancer   . Hypertension 04/04/2013  . Hyperlipidemia 04/04/2013  . Bipolar 1 disorder 04/04/2013  . Breast cancer 03/10/13    left breast invasive ca  . Allergy     latex  . Obesity   . Diabetes mellitus without complication     NIDDM  . Sleep apnea     no cpap due to insurance  . Heart murmur   . GERD (gastroesophageal reflux disease)   . Coronary artery disease     PAST SURGICAL HISTORY: Past Surgical History  Procedure Laterality Date  . Cholecystectomy    . Breast biopsy Left 05/08/09    fibroadenoma with microcalcifications,no malignancy id  . Breast biopsy Left 03/07/13  invasive ca,1 lymph node metastatic  . 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 Abdominal hysterectomy    . Coronary stent placement  2001  . Cardiac catheterization    . Eye surgery  6/13,9/14    laser,cataract lft  . Coronary angioplasty      LAD stent 2003  . Portacath placement Right 04/22/2013    Procedure: INSERTION PORT-A-CATH;  Surgeon: Adin Hector, MD;  Location: [place];  Service: General;  Laterality: Right;  . Mastectomy modified radical Left 04/22/2013    Procedure: MASTECTOMY MODIFIED RADICAL;  Surgeon: Adin Hector, MD;  Location: [place];  Service: General;  Laterality: Left;  . Breast lumpectomy with needle localization and axillary sentinel lymph node bx Right  04/22/2013    Procedure: BREAST LUMPECTOMY WITH NEEDLE LOCALIZATION AND AXILLARY SENTINEL LYMPH NODE BX;  Surgeon: Adin Hector, MD;  Location: [place];  Service: General;  Laterality: Right;  . Esophagogastroduodenoscopy N/A 08/07/2013    Procedure: ESOPHAGOGASTRODUODENOSCOPY (EGD);  Surgeon: Missy Sabins, MD;  Location: [facility];  Service: Endoscopy;  Laterality: N/A;    FAMILY HISTORY: Family History  Problem Relation Age of Onset  . Heart disease Mother   . Breast cancer Maternal Aunt     dx in her 60s  . Heart disease Cousin 22    maternal cousin  . Heart attack Sister 18  . Cancer Paternal Aunt     NOS  . Cancer Maternal Aunt     NOS  . Lung cancer Cousin 48    maternal cousin - non smoker  . Brain cancer Cousin 13    maternal cousin's son  . Lung cancer Cousin 23    maternal cousin's son  . Breast cancer Cousin     paternal cousin    SOCIAL HISTORY: History  Substance Use Topics  . Smoking status: Former Smoker -- 1.00 packs/day for 15 years    Types: Cigarettes    Quit date: 07/19/1987  . Smokeless tobacco: Never Used  . Alcohol Use: No    ALLERGIES: Allergies  Allergen Reactions  . Adhesive [Tape] Rash    Clear tape gloves ,elastic no problem     MEDICATIONS:  Current Outpatient Prescriptions  Medication Sig Dispense Refill  . amLODipine (NORVASC) 10 MG tablet Take 10 mg by mouth daily.      . Azilsartan-Chlorthalidone 40-25 MG TABS Take 1 tablet by mouth daily.       [name] buPROPion (WELLBUTRIN SR) 150 MG 12 hr tablet Take 1 tablet (150 mg total) by mouth daily.  30 tablet  6  . dexamethasone (DECADRON) 4 MG tablet Take 1 tablet (4 mg total) by mouth as directed. Take 2 tablets once daily for three days starting the day after chemotherapy  60 tablet  0  . gabapentin (NEURONTIN) 100 MG capsule Take 3 capsules (300 mg total) by mouth 3 (three) times daily.  870 capsule  6  . hydrALAZINE (APRESOLINE) 25 MG tablet Take 25 mg by mouth 2 (two) times daily.       [name] HYDROcodone-acetaminophen (NORCO/VICODIN) 5-325 MG per tablet Take 1-2 tablets by mouth every 4 (four) hours as needed.  30 tablet  0  . lidocaine-prilocaine (EMLA) cream Apply topically as needed. Apply to port-a-cath one - two hours before accessing.  30 g  3  . LORazepam (ATIVAN) 0.5 MG tablet Take 1 tablet (0.12m) every 6 hours as needed for nausea or vomiting.  30 tablet  2  . losartan (COZAAR) 100 MG tablet Take 1 tablet (100 mg total) by mouth daily.  30 tablet  6  . metoprolol succinate (TOPROL-XL) 25 MG 24 hr tablet Take 25 mg by mouth daily.      . ondansetron (ZOFRAN) 8 MG tablet Take 1 tablet (56ms) every 12 hours as needed for nausea or vomiting starting the 3rd day after chemotherapy.  30 tablet  1  . pantoprazole (PROTONIX) 40 MG tablet Take 1 tablet (40 mg total) by mouth daily.  30 tablet  3  . pioglitazone-metformin (ACTOPLUS MET) 15-850 MG per tablet Take 1 tablet by mouth 2 (two) times daily with a meal.      . PRESCRIPTION MEDICATION palonosetron (ALOXI) injection 0.25 mg 0.25 mg  Once 08/02/2013 08/02/2013      Route: Intravenous      . PRESCRIPTION MEDICATION epirubicin (ELLENCE) chemo injection 200 mg 100 mg/m2  2 m2 (Order-Specific)  Once 08/02/2013      . PRESCRIPTION MEDICATION fluorouracil (ADRUCIL) chemo injection 1,000 mg 500 mg/m2  2.02 m2 (Treatment Plan Actual)  Once 08/02/2013      . PRESCRIPTION MEDICATION cyclophosphamide (CYTOXAN) 1,000 mg in sodium chloride 0.9 % 250 mL chemo infusion 500 mg/m2  2 m2 (Order-Specific)  Once 08/02/2013      . rosuvastatin (CRESTOR) 5 MG tablet Take 5 mg by mouth daily.      . sitaGLIPtin (JANUVIA) 100 MG tablet Take 100 mg by mouth daily.       No current facility-administered medications for this visit.   Facility-Administered Medications Ordered in Other Visits  Medication Dose Route Frequency Provider Last Rate Last Dose  . sodium chloride 0.9 % injection 10 mL  10 mL Intravenous PRN KDeatra Robinson MD   10 mL at  05/24/13 1522      REVIEW OF SYSTEMS: A 10 point review of systems was completed and is negative except as noted above.   PHYSICAL EXAMINATION: BP 171/83  Pulse 94  Temp(Src) 97.9 F (36.6 C) (Oral)  Resp 18  Ht _0  (1.575 m)  Wt 199 lb 12.8 oz (90.629 kg)  BMI 36.53 kg/m2  GENERAL: Patient is a well appearing female in no acute distress HEENT:  Sclerae anicteric.  Oropharynx clear and moist. No ulcerations or evidence of oropharyngeal candidiasis. Neck is supple.  NODES:  No cervical, supraclavicular, or axillary lymphadenopathy palpated.  BREAST EXAM:  Deferred. LUNGS:  Clear to auscultation bilaterally.  No wheezes or rhonchi. HEART:  Regular rate and rhythm. No murmur appreciated. ABDOMEN:  Soft, nontender.  Positive, normoactive bowel sounds. No organomegaly palpated. MSK:  No focal spinal tenderness to palpation. Full range of motion bilaterally in the upper extremities. EXTREMITIES:  No peripheral edema.   SKIN:  Clear with no obvious rashes or skin changes. Nails have started to become darkened at nail beds and are brittle, palms are hyperpigmented. Right thumb is wrapped.   NEURO:  Nonfocal. Well oriented.  Appropriate affect. ECOG FS:  0 - Asymptomatic   LAB RESULTS: Lab Results  Component Value Date   WBC 7.5 09/20/2013   NEUTROABS 5.9 09/20/2013   HGB 10.8* 09/20/2013   HCT 32.2* 09/20/2013   MCV 96.0 09/20/2013   PLT 250 09/20/2013      Chemistry      Component Value Date/Time   NA 134* 09/20/2013 0918   NA 136* 08/09/2013 0437   K 4.2 09/20/2013 0918   K 3.8 08/09/2013 0437   CL 100 08/09/2013  0437   CO2 26 09/20/2013 0918   CO2 25 08/09/2013 0437   BUN 13.3 09/20/2013 0918   BUN 8 08/09/2013 0437   CREATININE 0.8 09/20/2013 0918   CREATININE 0.69 08/09/2013 0437      Component Value Date/Time   CALCIUM 9.5 09/20/2013 0918   CALCIUM 8.4 08/09/2013 0437   ALKPHOS 88 09/20/2013 0918   ALKPHOS 103 08/09/2013 0437   AST 13 09/20/2013 0918   AST 8 08/09/2013 0437   ALT 15  09/20/2013 0918   ALT 5 08/09/2013 0437   BILITOT 0.29 09/20/2013 0918   BILITOT 0.2* 08/09/2013 0437       No results found for this basename: [id number]    No components found with this basename: [id number]     RADIOGRAPHIC STUDIES: 1.    2-D echocardiogram on 04/18/2013 showed a left ventricular ejection fraction of 55% - 60%    ASSESSMENT/PLAN:  VALENE VILLA is a  58 y.o. woman:  1.  Status post left breast modified radical mastectomy with left axillary lymph node resection and right breast lumpectomy with right axillary node biopsy on 04/22/2013 with pathology results as follows:  Left breast: Stage IIIA, pT3, pN2a, invasive ductal carcinoma with multiple foci, grade 1, largest span was 5.5 cm and ductal carcinoma in situ low-grade, 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% positive, progesterone receptor 23% positive, HER-2/neu no amplification, Ki-67 33%, with 4/22 metastatic lymph nodes and 1 isolated tumor cells identified within left axillary lymph node resection.  Extracapsular extension was present and surgical resection margins were negative for carcinoma.  Right breast: Stage 0, pTis, pN0, 3.0 cm ductal carcinoma in situ with calcifications, intermediate to high-grade, 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% positive, progesterone receptor 11% positive, with 0/4 metastatic right axillary lymph nodes.  Reexcision of right breast medial margin: Ductal carcinoma in situ, intermediate grade spanning 0.8 cm, ductal carcinoma in situ is focally less than 0.1 cm from the new medial margin, lobular neoplasia (atypical lobular hyperplasia).  2.  Referred for genetics counseling/testing. 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  S/P Adjuvant chemotherapy consisting of FEC (5-FU/Epirubicin/Cytoxan)  that will be followed by adjuvant chemotherapy consisting of Taxol.  2-D echocardiogram on 04/18/2013 showed a left ventricular ejection fraction of 55% - 60%.   Started adjuvant chemotherapy with  FEC on 05/24/2013 - 07/23/13 X 6 CYCLES  #4 patient is status post 2 cycles of Taxol last 1 administered on 08/23/2013. This is discontinued due to to significant the grade 3 with neuropathy.  #5 Patient is due for cycle 1 day 15 of Abraxane therapy today.  Blood counts look good today, she feels very well no signs or symptoms of infections.  #5 neuropathy: I have recommended patient continue super B complex. I have also increased her gabapentin to 3 capsules (300 mg) 3 times a day.  #6 anemia:  Secondary to chemotherapy. Patient is asymptomatic we will continue to monitor her.  #7 patient will be seen back 10/04/13 for cycle 2 day 1 of Abraxane.  All questions were answered.  Ms. Cleckley was encouraged to contact us in the interim with any problems, questions or concerns  I spent 25 minutes counseling the patient face to face.  The total time spent in the appointment was 30 minutes.  Marcy Panning, MD Medical/Oncology [facility] [phone] (beeper) [phone] (Office)

## 2013-09-25 ENCOUNTER — Telehealth: Payer: Self-pay | Admitting: Oncology

## 2013-09-25 NOTE — Telephone Encounter (Signed)
appts requested per 3/10 pof are already on schedule. appts for 3/20 - 3/27 and 4/3. on schedule since january/february.

## 2013-09-27 ENCOUNTER — Other Ambulatory Visit (HOSPITAL_BASED_OUTPATIENT_CLINIC_OR_DEPARTMENT_OTHER): Payer: Medicare HMO

## 2013-09-27 ENCOUNTER — Encounter: Payer: Self-pay | Admitting: Adult Health

## 2013-09-27 ENCOUNTER — Telehealth: Payer: Self-pay | Admitting: *Deleted

## 2013-09-27 ENCOUNTER — Ambulatory Visit (HOSPITAL_BASED_OUTPATIENT_CLINIC_OR_DEPARTMENT_OTHER): Payer: Medicare HMO | Admitting: Adult Health

## 2013-09-27 ENCOUNTER — Ambulatory Visit: Payer: Medicare HMO

## 2013-09-27 VITALS — BP 188/81 | HR 89 | Temp 98.5°F | Resp 18 | Ht 62.0 in | Wt 199.3 lb

## 2013-09-27 DIAGNOSIS — D6481 Anemia due to antineoplastic chemotherapy: Secondary | ICD-10-CM

## 2013-09-27 DIAGNOSIS — I10 Essential (primary) hypertension: Secondary | ICD-10-CM

## 2013-09-27 DIAGNOSIS — C773 Secondary and unspecified malignant neoplasm of axilla and upper limb lymph nodes: Secondary | ICD-10-CM

## 2013-09-27 DIAGNOSIS — C50419 Malignant neoplasm of upper-outer quadrant of unspecified female breast: Secondary | ICD-10-CM

## 2013-09-27 DIAGNOSIS — L03039 Cellulitis of unspecified toe: Secondary | ICD-10-CM

## 2013-09-27 DIAGNOSIS — Z170 Estrogen receptor positive status [ER+]: Secondary | ICD-10-CM

## 2013-09-27 DIAGNOSIS — T451X5A Adverse effect of antineoplastic and immunosuppressive drugs, initial encounter: Secondary | ICD-10-CM

## 2013-09-27 DIAGNOSIS — C50412 Malignant neoplasm of upper-outer quadrant of left female breast: Secondary | ICD-10-CM

## 2013-09-27 DIAGNOSIS — G5690 Unspecified mononeuropathy of unspecified upper limb: Secondary | ICD-10-CM

## 2013-09-27 DIAGNOSIS — G5790 Unspecified mononeuropathy of unspecified lower limb: Secondary | ICD-10-CM

## 2013-09-27 LAB — CBC WITH DIFFERENTIAL/PLATELET
BASO%: 0.8 % (ref 0.0–2.0)
BASOS ABS: 0 10*3/uL (ref 0.0–0.1)
EOS ABS: 0.1 10*3/uL (ref 0.0–0.5)
EOS%: 2.2 % (ref 0.0–7.0)
HCT: 31.6 % — ABNORMAL LOW (ref 34.8–46.6)
HGB: 10.5 g/dL — ABNORMAL LOW (ref 11.6–15.9)
LYMPH%: 15.8 % (ref 14.0–49.7)
MCH: 31.8 pg (ref 25.1–34.0)
MCHC: 33.2 g/dL (ref 31.5–36.0)
MCV: 95.8 fL (ref 79.5–101.0)
MONO#: 0.3 10*3/uL (ref 0.1–0.9)
MONO%: 5.6 % (ref 0.0–14.0)
NEUT#: 3.9 10*3/uL (ref 1.5–6.5)
NEUT%: 75.6 % (ref 38.4–76.8)
Platelets: 274 10*3/uL (ref 145–400)
RBC: 3.29 10*6/uL — ABNORMAL LOW (ref 3.70–5.45)
RDW: 16.3 % — AB (ref 11.2–14.5)
WBC: 5.2 10*3/uL (ref 3.9–10.3)
lymph#: 0.8 10*3/uL — ABNORMAL LOW (ref 0.9–3.3)

## 2013-09-27 LAB — COMPREHENSIVE METABOLIC PANEL (CC13)
ALK PHOS: 80 U/L (ref 40–150)
ALT: 12 U/L (ref 0–55)
AST: 9 U/L (ref 5–34)
Albumin: 3.3 g/dL — ABNORMAL LOW (ref 3.5–5.0)
Anion Gap: 10 mEq/L (ref 3–11)
BILIRUBIN TOTAL: 0.31 mg/dL (ref 0.20–1.20)
BUN: 10.5 mg/dL (ref 7.0–26.0)
CO2: 24 mEq/L (ref 22–29)
CREATININE: 0.8 mg/dL (ref 0.6–1.1)
Calcium: 9.4 mg/dL (ref 8.4–10.4)
Chloride: 102 mEq/L (ref 98–109)
Glucose: 286 mg/dl — ABNORMAL HIGH (ref 70–140)
POTASSIUM: 4.3 meq/L (ref 3.5–5.1)
Sodium: 136 mEq/L (ref 136–145)
Total Protein: 5.9 g/dL — ABNORMAL LOW (ref 6.4–8.3)

## 2013-09-27 NOTE — Telephone Encounter (Signed)
gv pt the info for the foot center...td

## 2013-09-27 NOTE — Progress Notes (Signed)
[facility]  [phone] [phone]  OFFICE PROGRESS NOTE    PATIENT: Kathy Maxwell   DOB: [birth year]  MR#: [id number]  UUV#:[id number]   KV:[id number] N, MD SU:  Fanny Skates, MD   DIAGNOSIS: Kathy Maxwell is a 58 y.o. 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  Treated with adjuvant chemotherapy.  Breast cancer of upper-outer quadrant of left female breast   Primary site: Breast (Left)   Staging method: AJCC 7th Edition   Pathologic: Stage IIIA (T3, N2, cM0) signed by [name], MD on 09/01/2013 11:36 PM   Summary: Stage IIIA (T3, N2, cM0)  RIGHT BREAST: stage 0 (Tis N0) DCIS  PRIOR THERAPY: 1.  Screening mammogram in 12/2012 was abnormal which led to left digital diagnostic mammogram and left breast ultrasound on 03/01/2013. Ultrasound of the left breast showed a hypoechoic mass at 12:30, 6 cm from the nipple, that measured 1.9 x 2.6 x 2.9 cm.  Ultrasound of the left axilla demonstrates a 1.1 x 0.7 x 0.9 cm lymph node with a borderline thickened cortex.  2.  Left breast needle core biopsy at the 12:30 o'clock position and left needle core biopsy of the left axillary lymph node on 03/07/2013 showed invasive mammary carcinoma with features compatible with a grade 1 or 2 ductal carcinoma in the breast needle core biopsy and lymph node was positive for metastatic mammary carcinoma with a macrometastasis and extracapsular extension identified.  Estrogen receptor 100% positive, progesterone receptor 75% positive, Ki-67 15%, HER-2/neu by CISH no amplification.  3.  Bilateral breast MRI on 03/15/2013 showed in the left breast there were at least 4 focus of abnormal spiculated enhancement in the upper left breast  centrally.  The entire area measured 7.4 cm x 6.6 cm x 5.1 cm.  The previous biopsy focus was posteriorly located and measured 2.1 x 1 x 2.1 cm.  The anterior focus that was not biopsied measured 3.1 x 2.6 x 2.6 cm.  A lateral focus that was not biopsied measured 1.1 x 1.4 x 1.6 cm.  There were small medial focus measuring cyst 0.7 x 0.8 x 0.7 cm  Post biopsy changes were identified in the left axilla.  There were abnormal enlarged lymph nodes correlating to the patient's known metastatic neoplasm to the lymph nodes which the largest lymph node measured 1.8 x 0.8 cm.  In the right breast there was an irregular focus of 3.3 x 3.1 x 1.3 cm abnormal enhancement in the central right breast in the middle one third.  There was a mild thickened cortex and prominent lymph nodes within the right axilla with the largest measuring 1.6 x 1 cm.  There was a posterior central upper breast focus with associated clip artifact measuring 1.2 x 2.2 x 1 cm previously biopsied to be benign.  Suspicious findings in bilateral breasts.  4.  Anterior left breast needle core biopsy and central right breast needle core biopsy on 04/03/2013.  Left breast needle core biopsy showed invasive ductal carcinoma and ductal carcinoma in situ, 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% positive, progesterone receptor 23% positive, Ki-67 33%, and HER-2/neu by CISH no amplification.  Right breast needle core biopsy showed ductal carcinoma in situ, 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% positive, progesterone receptor 11% positive.  5.  Status post left  breast modified radical mastectomy with left axillary lymph node resection and right breast lumpectomy with right axillary node biopsy on 04/22/2013 with pathology results as follows:  Left breast: Stage IIIA, pT3, pN2a, invasive ductal carcinoma with multiple foci, grade 1, largest span was 5.5 cm and ductal carcinoma in situ low-grade, 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% positive, progesterone receptor 23% positive, HER-2/neu no amplification,  Ki-67 33%, with 4/22 metastatic lymph nodes and 1 isolated tumor cells identified within left axillary lymph node resection.  Extracapsular extension was present and surgical resection margins were negative for carcinoma.  Right breast: Stage 0, pTis, pN0, 3.0 cm ductal carcinoma in situ with calcifications, intermediate to high-grade, 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% positive, progesterone receptor 11% positive, with 0/4 metastatic right axillary lymph nodes.  Reexcision of right breast medial margin: Ductal carcinoma in situ, intermediate grade spanning 0.8 cm, ductal carcinoma in situ is focally less than 0.1 cm from the new medial margin, lobular neoplasia (atypical lobular hyperplasia).  6.  Referred for genetics counseling and testing - negative test results but with ATM VUS  7.  Adjuvant chemotherapy consisting of FEC (5-FU/epirubicin/Cytoxan)  that will be followed by adjuvant chemotherapy consisting of Taxol.  2-D echocardiogram on 04/18/2013 showed a left ventricular ejection fraction of 55% - 60%.   Started adjuvant chemotherapy with FEC on 05/24/2013 - 07/23/13.  8. Status post 2 cycles of single agent Taxol administered from 08/16/2013 through 08/23/2013. 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  CURRENT THERAPY:  Cycle 1 day 21 of Abraxane on a 28 day cycle  INTERVAL HISTORY: Kathy Maxwell is here for evaluation prior to receiving chemotherapy.  Currently she is receiving Abraxane, 100mg /m2 on day 1, day 8, and day 15 of a 28 day cycle.  She is doing moderately well today.  I noticed that she is and has been hypertensive during her last few visits. She conveys that she doesn't always take her blood pressure medication the morning of her appointments.  She is requesting paperwork be completed for a hover round due to occasionally feeling dizzy when she goes out shopping.  She states that she has had vertigo for several years and thinks that  that may be what is going on.  Her neuropathy is improved in her fingertips and toes.  She did increase her Gabapentin dose to 300mg  TID.  She is having difficulty with her toenails and would like a referral to podiatry for them to be cut and evaluated.  Otherwise, she denies fevers, chills, nausea, vomiting, constipation, diarrhea, mouth pain, or any other concerns.    PAST MEDICAL HISTORY: Past Medical History  Diagnosis Date  . Hyperlipidemia   . Hypertension   . Bipolar 1 disorder   . Cancer   . Hypertension 04/04/2013  . Hyperlipidemia 04/04/2013  . Bipolar 1 disorder 04/04/2013  . Breast cancer 03/10/13    left breast invasive ca  . Allergy     latex  . Obesity   . Diabetes mellitus without complication     NIDDM  . Sleep apnea     no cpap due to insurance  . Heart murmur   . GERD (gastroesophageal reflux disease)   . Coronary artery disease     PAST SURGICAL HISTORY: Past Surgical History  Procedure Laterality Date  . Cholecystectomy    . Breast biopsy Left 05/08/09    fibroadenoma with microcalcifications,no malignancy id  . Breast biopsy Left 03/07/13    invasive ca,1  lymph node metastatic  . 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 Abdominal hysterectomy    . Coronary stent placement  2001  . Cardiac catheterization    . Eye surgery  6/13,9/14    laser,cataract lft  . Coronary angioplasty      LAD stent 2003  . Portacath placement Right 04/22/2013    Procedure: INSERTION PORT-A-CATH;  Surgeon: Adin Hector, MD;  Location: [facility];  Service: General;  Laterality: Right;  . Mastectomy modified radical Left 04/22/2013    Procedure: MASTECTOMY MODIFIED RADICAL;  Surgeon: Adin Hector, MD;  Location: [facility];  Service: General;  Laterality: Left;  . Breast lumpectomy with needle localization and axillary sentinel lymph node bx Right 04/22/2013    Procedure: BREAST LUMPECTOMY WITH NEEDLE LOCALIZATION AND AXILLARY  SENTINEL LYMPH NODE BX;  Surgeon: Adin Hector, MD;  Location: [facility];  Service: General;  Laterality: Right;  . Esophagogastroduodenoscopy N/A 08/07/2013    Procedure: ESOPHAGOGASTRODUODENOSCOPY (EGD);  Surgeon: Missy Sabins, MD;  Location: [facility];  Service: Endoscopy;  Laterality: N/A;    FAMILY HISTORY: Family History  Problem Relation Age of Onset  . Heart disease Mother   . Breast cancer Maternal Aunt     dx in her 61s  . Heart disease Cousin 31    maternal cousin  . Heart attack Sister 25  . Cancer Paternal Aunt     NOS  . Cancer Maternal Aunt     NOS  . Lung cancer Cousin 91    maternal cousin - non smoker  . Brain cancer Cousin 13    maternal cousin's son  . Lung cancer Cousin 34    maternal cousin's son  . Breast cancer Cousin     paternal cousin    SOCIAL HISTORY: History  Substance Use Topics  . Smoking status: Former Smoker -- 1.00 packs/day for 15 years    Types: Cigarettes    Quit date: 07/19/1987  . Smokeless tobacco: Never Used  . Alcohol Use: No    ALLERGIES: Allergies  Allergen Reactions  . Adhesive [Tape] Rash    Clear tape gloves ,elastic no problem     MEDICATIONS:  Current Outpatient Prescriptions  Medication Sig Dispense Refill  . amLODipine (NORVASC) 10 MG tablet Take 10 mg by mouth daily.      . Azilsartan-Chlorthalidone 40-25 MG TABS Take 1 tablet by mouth daily.       [name] buPROPion (WELLBUTRIN SR) 150 MG 12 hr tablet Take 1 tablet (150 mg total) by mouth daily.  30 tablet  6  . dexamethasone (DECADRON) 4 MG tablet Take 1 tablet (4 mg total) by mouth as directed. Take 2 tablets once daily for three days starting the day after chemotherapy  60 tablet  0  . gabapentin (NEURONTIN) 100 MG capsule Take 3 capsules (300 mg total) by mouth 3 (three) times daily.  870 capsule  6  . hydrALAZINE (APRESOLINE) 25 MG tablet Take 25 mg by mouth 2 (two) times daily.      [name] HYDROcodone-acetaminophen (NORCO/VICODIN) 5-325 MG per tablet Take 1-2  tablets by mouth every 4 (four) hours as needed.  30 tablet  0  . lidocaine-prilocaine (EMLA) cream Apply topically as needed. Apply to port-a-cath one - two hours before accessing.  30 g  3  . LORazepam (ATIVAN) 0.5 MG tablet Take 1 tablet (0.$RemoveBef'5mg'IoukiEZttI$ ) every 6 hours as needed for nausea or vomiting.  30 tablet  2  .  losartan (COZAAR) 100 MG tablet Take 1 tablet (100 mg total) by mouth daily.  30 tablet  6  . metoprolol succinate (TOPROL-XL) 25 MG 24 hr tablet Take 25 mg by mouth daily.      . ondansetron (ZOFRAN) 8 MG tablet Take 1 tablet ($RemoveB'8mg'pfbgcJXw$ s) every 12 hours as needed for nausea or vomiting starting the 3rd day after chemotherapy.  30 tablet  1  . pantoprazole (PROTONIX) 40 MG tablet Take 1 tablet (40 mg total) by mouth daily.  30 tablet  3  . pioglitazone-metformin (ACTOPLUS MET) 15-850 MG per tablet Take 1 tablet by mouth 2 (two) times daily with a meal.      . PRESCRIPTION MEDICATION palonosetron (ALOXI) injection 0.25 mg 0.25 mg  Once 08/02/2013 08/02/2013      Route: Intravenous      . PRESCRIPTION MEDICATION epirubicin (ELLENCE) chemo injection 200 mg 100 mg/m2  2 m2 (Order-Specific)  Once 08/02/2013      . PRESCRIPTION MEDICATION fluorouracil (ADRUCIL) chemo injection 1,000 mg 500 mg/m2  2.02 m2 (Treatment Plan Actual)  Once 08/02/2013      . PRESCRIPTION MEDICATION cyclophosphamide (CYTOXAN) 1,000 mg in sodium chloride 0.9 % 250 mL chemo infusion 500 mg/m2  2 m2 (Order-Specific)  Once 08/02/2013      . rosuvastatin (CRESTOR) 5 MG tablet Take 5 mg by mouth daily.      . sitaGLIPtin (JANUVIA) 100 MG tablet Take 100 mg by mouth daily.       No current facility-administered medications for this visit.   Facility-Administered Medications Ordered in Other Visits  Medication Dose Route Frequency Provider Last Rate Last Dose  . sodium chloride 0.9 % injection 10 mL  10 mL Intravenous PRN [name], MD   10 mL at 05/24/13 1522      REVIEW OF SYSTEMS: A 10 point review of systems was  completed and is negative except as noted above.   PHYSICAL EXAMINATION: BP 188/81  Pulse 89  Temp(Src) 98.5 F (36.9 C) (Oral)  Resp 18  Ht $R'5\' 2"'vp$  (1.575 m)  Wt 199 lb 4.8 oz (90.402 kg)  BMI 36.44 kg/m2  GENERAL: Patient is a well appearing female in no acute distress HEENT:  Sclerae anicteric.  Oropharynx clear and moist. No ulcerations or evidence of oropharyngeal candidiasis. Neck is supple.  NODES:  No cervical, supraclavicular, or axillary lymphadenopathy palpated.  BREAST EXAM:  Deferred. LUNGS:  Clear to auscultation bilaterally.  No wheezes or rhonchi. HEART:  Regular rate and rhythm. No murmur appreciated. ABDOMEN:  Soft, nontender.  Positive, normoactive bowel sounds. No organomegaly palpated. MSK:  No focal spinal tenderness to palpation. Full range of motion bilaterally in the upper extremities. EXTREMITIES:  No peripheral edema.   SKIN:  Clear with no obvious rashes or skin changes. Nails have started to become darkened at nail beds and are brittle, palms are hyperpigmented. Nails are loose and are beginning to detach from nail bed.    NEURO:  Nonfocal. Well oriented.  Appropriate affect. ECOG FS:  0 - Asymptomatic   LAB RESULTS: Lab Results  Component Value Date   WBC 5.2 09/27/2013   NEUTROABS 3.9 09/27/2013   HGB 10.5* 09/27/2013   HCT 31.6* 09/27/2013   MCV 95.8 09/27/2013   PLT 274 09/27/2013      Chemistry      Component Value Date/Time   NA 136 09/27/2013 0904   NA 136* 08/09/2013 0437   K 4.3 09/27/2013 0904   K 3.8 08/09/2013 0437  CL 100 08/09/2013 0437   CO2 24 09/27/2013 0904   CO2 25 08/09/2013 0437   BUN 10.5 09/27/2013 0904   BUN 8 08/09/2013 0437   CREATININE 0.8 09/27/2013 0904   CREATININE 0.69 08/09/2013 0437      Component Value Date/Time   CALCIUM 9.4 09/27/2013 0904   CALCIUM 8.4 08/09/2013 0437   ALKPHOS 80 09/27/2013 0904   ALKPHOS 103 08/09/2013 0437   AST 9 09/27/2013 0904   AST 8 08/09/2013 0437   ALT 12 09/27/2013 0904   ALT 5 08/09/2013  0437   BILITOT 0.31 09/27/2013 0904   BILITOT 0.2* 08/09/2013 0437       No results found for this basename: [id number]    No components found with this basename: [id number]     RADIOGRAPHIC STUDIES: 1.    2-D echocardiogram on 04/18/2013 showed a left ventricular ejection fraction of 55% - 60%    ASSESSMENT/PLAN:  Kathy Maxwell is a  58 y.o. woman:  1.  Status post left breast modified radical mastectomy with left axillary lymph node resection and right breast lumpectomy with right axillary node biopsy on 04/22/2013 with pathology results as follows:  Left breast: Stage IIIA, pT3, pN2a, invasive ductal carcinoma with multiple foci, grade 1, largest span was 5.5 cm and ductal carcinoma in situ low-grade, 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% positive, progesterone receptor 23% positive, HER-2/neu no amplification, Ki-67 33%, with 4/22 metastatic lymph nodes and 1 isolated tumor cells identified within left axillary lymph node resection.  Extracapsular extension was present and surgical resection margins were negative for carcinoma.  Right breast: Stage 0, pTis, pN0, 3.0 cm ductal carcinoma in situ with calcifications, intermediate to high-grade, 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% positive, progesterone receptor 11% positive, with 0/4 metastatic right axillary lymph nodes.  Reexcision of right breast medial margin: Ductal carcinoma in situ, intermediate grade spanning 0.8 cm, ductal carcinoma in situ is focally less than 0.1 cm from the new medial margin, lobular neoplasia (atypical lobular hyperplasia).  2.  Referred for genetics counseling/testing. 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  S/P Adjuvant chemotherapy consisting of FEC (5-FU/Epirubicin/Cytoxan)  that will be followed by adjuvant chemotherapy consisting of Taxol.  2-D echocardiogram on 04/18/2013 showed a left ventricular ejection fraction of 55% - 60%.   S/p adjuvant chemotherapy with FEC on 05/24/2013 - 07/23/13 X 6  CYCLES  #4 patient is status post 2 cycles of Taxol last 1 administered on 08/23/2013. This is discontinued due to significant the grade 3 with neuropathy.  #5 Patient is cycle 1 day 21 of Abraxane therapy today.  Her CBC is stable, I reviewed it with her in detail.  Her CMP is pending  #5 neuropathy: I have recommended patient continue super B complex. Her neuropathy is improved with Gabapentin $RemoveBeforeD'300mg'tsyjqmjeopkkSK$  TID.  She will continue this.    #6 anemia:  Secondary to chemotherapy. Patient is asymptomatic we will continue to monitor her.  #7  I will refer the patient to podiatry for her toenails and feet.    #8  I encouraged the patient to discuss her hypertension with her PCP at her next appointment in 2 weeks.  Also, to discuss the hover round. I discussed her limiting her sodium intake and to eat more fruits and vegetables, and if she starts to develop symptoms of her hypertension she should contact her PCP's office.    #9 patient will be seen back 10/04/13 for cycle 2 day 1 of Abraxane.  All  questions were answered.  Ms. Cunanan was encouraged to contact us in the interim with any problems, questions or concerns  I spent 25 minutes counseling the patient face to face.  The total time spent in the appointment was 30 minutes.  Minette Headland, [place] [phone]

## 2013-10-04 ENCOUNTER — Ambulatory Visit (HOSPITAL_BASED_OUTPATIENT_CLINIC_OR_DEPARTMENT_OTHER): Payer: Medicare HMO | Admitting: Adult Health

## 2013-10-04 ENCOUNTER — Encounter: Payer: Self-pay | Admitting: Adult Health

## 2013-10-04 ENCOUNTER — Ambulatory Visit (HOSPITAL_BASED_OUTPATIENT_CLINIC_OR_DEPARTMENT_OTHER): Payer: Medicare HMO

## 2013-10-04 ENCOUNTER — Other Ambulatory Visit (HOSPITAL_BASED_OUTPATIENT_CLINIC_OR_DEPARTMENT_OTHER): Payer: Medicare HMO

## 2013-10-04 VITALS — BP 171/73 | HR 88 | Temp 98.6°F | Resp 18 | Ht 62.0 in | Wt 202.3 lb

## 2013-10-04 DIAGNOSIS — C773 Secondary and unspecified malignant neoplasm of axilla and upper limb lymph nodes: Secondary | ICD-10-CM

## 2013-10-04 DIAGNOSIS — D0590 Unspecified type of carcinoma in situ of unspecified breast: Secondary | ICD-10-CM

## 2013-10-04 DIAGNOSIS — C50412 Malignant neoplasm of upper-outer quadrant of left female breast: Secondary | ICD-10-CM

## 2013-10-04 DIAGNOSIS — L609 Nail disorder, unspecified: Secondary | ICD-10-CM

## 2013-10-04 DIAGNOSIS — C50419 Malignant neoplasm of upper-outer quadrant of unspecified female breast: Secondary | ICD-10-CM

## 2013-10-04 DIAGNOSIS — Z5111 Encounter for antineoplastic chemotherapy: Secondary | ICD-10-CM

## 2013-10-04 DIAGNOSIS — I10 Essential (primary) hypertension: Secondary | ICD-10-CM

## 2013-10-04 DIAGNOSIS — G5690 Unspecified mononeuropathy of unspecified upper limb: Secondary | ICD-10-CM

## 2013-10-04 LAB — CBC WITH DIFFERENTIAL/PLATELET
BASO%: 0.4 % (ref 0.0–2.0)
Basophils Absolute: 0 10*3/uL (ref 0.0–0.1)
EOS ABS: 0.1 10*3/uL (ref 0.0–0.5)
EOS%: 1.2 % (ref 0.0–7.0)
HEMATOCRIT: 33.3 % — AB (ref 34.8–46.6)
HEMOGLOBIN: 10.9 g/dL — AB (ref 11.6–15.9)
LYMPH%: 18.1 % (ref 14.0–49.7)
MCH: 30.5 pg (ref 25.1–34.0)
MCHC: 32.7 g/dL (ref 31.5–36.0)
MCV: 93.3 fL (ref 79.5–101.0)
MONO#: 0.5 10*3/uL (ref 0.1–0.9)
MONO%: 10.5 % (ref 0.0–14.0)
NEUT%: 69.8 % (ref 38.4–76.8)
NEUTROS ABS: 3.5 10*3/uL (ref 1.5–6.5)
Platelets: 267 10*3/uL (ref 145–400)
RBC: 3.57 10*6/uL — ABNORMAL LOW (ref 3.70–5.45)
RDW: 14.5 % (ref 11.2–14.5)
WBC: 5 10*3/uL (ref 3.9–10.3)
lymph#: 0.9 10*3/uL (ref 0.9–3.3)
nRBC: 0 % (ref 0–0)

## 2013-10-04 LAB — COMPREHENSIVE METABOLIC PANEL (CC13)
ALBUMIN: 3.2 g/dL — AB (ref 3.5–5.0)
ALT: 12 U/L (ref 0–55)
AST: 13 U/L (ref 5–34)
Alkaline Phosphatase: 96 U/L (ref 40–150)
Anion Gap: 11 mEq/L (ref 3–11)
BUN: 10 mg/dL (ref 7.0–26.0)
CALCIUM: 9.4 mg/dL (ref 8.4–10.4)
CHLORIDE: 103 meq/L (ref 98–109)
CO2: 25 meq/L (ref 22–29)
Creatinine: 0.8 mg/dL (ref 0.6–1.1)
Glucose: 317 mg/dl — ABNORMAL HIGH (ref 70–140)
Potassium: 4 mEq/L (ref 3.5–5.1)
SODIUM: 138 meq/L (ref 136–145)
TOTAL PROTEIN: 6.2 g/dL — AB (ref 6.4–8.3)
Total Bilirubin: 0.36 mg/dL (ref 0.20–1.20)

## 2013-10-04 MED ORDER — DEXAMETHASONE SODIUM PHOSPHATE 10 MG/ML IJ SOLN
10.0000 mg | Freq: Once | INTRAMUSCULAR | Status: AC
  Administered 2013-10-04: 10 mg via INTRAVENOUS

## 2013-10-04 MED ORDER — ONDANSETRON 8 MG/50ML IVPB (CHCC)
8.0000 mg | Freq: Once | INTRAVENOUS | Status: AC
  Administered 2013-10-04: 8 mg via INTRAVENOUS

## 2013-10-04 MED ORDER — ONDANSETRON 8 MG/NS 50 ML IVPB
INTRAVENOUS | Status: AC
  Filled 2013-10-04: qty 8

## 2013-10-04 MED ORDER — DEXAMETHASONE SODIUM PHOSPHATE 10 MG/ML IJ SOLN
INTRAMUSCULAR | Status: AC
  Filled 2013-10-04: qty 1

## 2013-10-04 MED ORDER — HEPARIN SOD (PORK) LOCK FLUSH 100 UNIT/ML IV SOLN
500.0000 [IU] | Freq: Once | INTRAVENOUS | Status: AC | PRN
  Administered 2013-10-04: 500 [IU]
  Filled 2013-10-04: qty 5

## 2013-10-04 MED ORDER — SODIUM CHLORIDE 0.9 % IV SOLN
Freq: Once | INTRAVENOUS | Status: AC
  Administered 2013-10-04: 10:00:00 via INTRAVENOUS

## 2013-10-04 MED ORDER — SODIUM CHLORIDE 0.9 % IJ SOLN
10.0000 mL | INTRAMUSCULAR | Status: DC | PRN
  Administered 2013-10-04: 10 mL
  Filled 2013-10-04: qty 10

## 2013-10-04 MED ORDER — PACLITAXEL PROTEIN-BOUND CHEMO INJECTION 100 MG
100.0000 mg/m2 | Freq: Once | INTRAVENOUS | Status: AC
  Administered 2013-10-04: 200 mg via INTRAVENOUS
  Filled 2013-10-04: qty 40

## 2013-10-04 NOTE — Patient Instructions (Signed)
[facility] Discharge Instructions for Patients Receiving Chemotherapy  Today you received the following chemotherapy agents Abraxane.  To help prevent nausea and vomiting after your treatment, we encourage you to take your nausea medication Zofran 8 mg every 12 hours as needed.   If you develop nausea and vomiting that is not controlled by your nausea medication, call the clinic.   BELOW ARE SYMPTOMS THAT SHOULD BE REPORTED IMMEDIATELY:  *FEVER GREATER THAN 100.5 F  *CHILLS WITH OR WITHOUT FEVER  NAUSEA AND VOMITING THAT IS NOT CONTROLLED WITH YOUR NAUSEA MEDICATION  *UNUSUAL SHORTNESS OF BREATH  *UNUSUAL BRUISING OR BLEEDING  TENDERNESS IN MOUTH AND THROAT WITH OR WITHOUT PRESENCE OF ULCERS  *URINARY PROBLEMS  *BOWEL PROBLEMS  UNUSUAL RASH Items with * indicate a potential emergency and should be followed up as soon as possible.  Feel free to call the clinic you have any questions or concerns. The clinic phone number is [phone].

## 2013-10-04 NOTE — Progress Notes (Signed)
[place]  Telephone:([phone]) [phone] [phone]) [phone]  OFFICE PROGRESS NOTE    PATIENT: Kathy Maxwell   DOB: [birth year]  MR#: [id number]  XYI#:[id number]   OL:[id number] N, MD SU:  Fanny Skates, MD   DIAGNOSIS: Kathy Maxwell is a 58 y.o. 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  Treated with adjuvant chemotherapy.  Breast cancer of upper-outer quadrant of left female breast   Primary site: Breast (Left)   Staging method: AJCC 7th Edition   Pathologic: Stage IIIA (T3, N2, cM0) signed by [name], MD on 09/01/2013 11:36 PM   Summary: Stage IIIA (T3, N2, cM0)  RIGHT BREAST: stage 0 (Tis N0) DCIS  PRIOR THERAPY: 1.  Screening mammogram in 12/2012 was abnormal which led to left digital diagnostic mammogram and left breast ultrasound on 03/01/2013. Ultrasound of the left breast showed a hypoechoic mass at 12:30, 6 cm from the nipple, that measured 1.9 x 2.6 x 2.9 cm.  Ultrasound of the left axilla demonstrates a 1.1 x 0.7 x 0.9 cm lymph node with a borderline thickened cortex.  2.  Left breast needle core biopsy at the 12:30 o'clock position and left needle core biopsy of the left axillary lymph node on 03/07/2013 showed invasive mammary carcinoma with features compatible with a grade 1 or 2 ductal carcinoma in the breast needle core biopsy and lymph node was positive for metastatic mammary carcinoma with a macrometastasis and extracapsular extension identified.  Estrogen receptor 100% positive, progesterone receptor 75% positive, Ki-67 15%, HER-2/neu by CISH no amplification.  3.  Bilateral breast MRI on 03/15/2013 showed in the left breast there were at least 4 focus of abnormal spiculated enhancement in the upper left breast  centrally.  The entire area measured 7.4 cm x 6.6 cm x 5.1 cm.  The previous biopsy focus was posteriorly located and measured 2.1 x 1 x 2.1 cm.  The anterior focus that was not biopsied measured 3.1 x 2.6 x 2.6 cm.  A lateral focus that was not biopsied measured 1.1 x 1.4 x 1.6 cm.  There were small medial focus measuring cyst 0.7 x 0.8 x 0.7 cm  Post biopsy changes were identified in the left axilla.  There were abnormal enlarged lymph nodes correlating to the patient's known metastatic neoplasm to the lymph nodes which the largest lymph node measured 1.8 x 0.8 cm.  In the right breast there was an irregular focus of 3.3 x 3.1 x 1.3 cm abnormal enhancement in the central right breast in the middle one third.  There was a mild thickened cortex and prominent lymph nodes within the right axilla with the largest measuring 1.6 x 1 cm.  There was a posterior central upper breast focus with associated clip artifact measuring 1.2 x 2.2 x 1 cm previously biopsied to be benign.  Suspicious findings in bilateral breasts.  4.  Anterior left breast needle core biopsy and central right breast needle core biopsy on 04/03/2013.  Left breast needle core biopsy showed invasive ductal carcinoma and ductal carcinoma in situ, 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% positive, progesterone receptor 23% positive, Ki-67 33%, and HER-2/neu by CISH no amplification.  Right breast needle core biopsy showed ductal carcinoma in situ, 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% positive, progesterone receptor 11% positive.  5.  Status post left  breast modified radical mastectomy with left axillary lymph node resection and right breast lumpectomy with right axillary node biopsy on 04/22/2013 with pathology results as follows:  Left breast: Stage IIIA, pT3, pN2a, invasive ductal carcinoma with multiple foci, grade 1, largest span was 5.5 cm and ductal carcinoma in situ low-grade, 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% positive, progesterone receptor 23% positive, HER-2/neu no amplification,  Ki-67 33%, with 4/22 metastatic lymph nodes and 1 isolated tumor cells identified within left axillary lymph node resection.  Extracapsular extension was present and surgical resection margins were negative for carcinoma.  Right breast: Stage 0, pTis, pN0, 3.0 cm ductal carcinoma in situ with calcifications, intermediate to high-grade, 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% positive, progesterone receptor 11% positive, with 0/4 metastatic right axillary lymph nodes.  Reexcision of right breast medial margin: Ductal carcinoma in situ, intermediate grade spanning 0.8 cm, ductal carcinoma in situ is focally less than 0.1 cm from the new medial margin, lobular neoplasia (atypical lobular hyperplasia).  6.  Referred for genetics counseling and testing - negative test results but with ATM VUS  7.  2-D echocardiogram on 04/18/2013 showed a left ventricular ejection fraction of 55% - 60%.  s/p FEC on 05/24/2013 - 07/23/13.  8. Status post 2 cycles of single agent Taxol administered from 08/16/2013 through 08/23/2013. 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  CURRENT THERAPY:  Cycle 2 day 1 of Abraxane on a 28 day cycle  INTERVAL HISTORY: Kathy Maxwell is here for evaluation prior to receiving chemotherapy.  She is doing moderately well today.  She has been mildly constipated this week.  Her last bowel movement was yesterday, she has been eating more salads and has noticed an improvement.  She denies fevers, chills, nausea, vomiting, diarrhea.  Her thumb lesion is much improved and nearly resolved.  She continues to have significant nail dyscrasia.  She still suffers from very mild numbness in the tips of her fingers, but not in her toes.  She is taking Gabapentin 364m three times per day and tolerating it well.  Her appetite and fluid intake remain good.  I referred her to podiatry last week for the nail dyscrasia in her toenails, however she states that they are  improved and she did not go to the appointment.  Otherwise, a 10 point ROS is negative.    PAST MEDICAL HISTORY: Past Medical History  Diagnosis Date  . Hyperlipidemia   . Hypertension   . Bipolar 1 disorder   . Cancer   . Hypertension 04/04/2013  . Hyperlipidemia 04/04/2013  . Bipolar 1 disorder 04/04/2013  . Breast cancer 03/10/13    left breast invasive ca  . Allergy     latex  . Obesity   . Diabetes mellitus without complication     NIDDM  . Sleep apnea     no cpap due to insurance  . Heart murmur   . GERD (gastroesophageal reflux disease)   . Coronary artery disease     PAST SURGICAL HISTORY: Past Surgical History  Procedure Laterality Date  . Cholecystectomy    . Breast biopsy Left 05/08/09    fibroadenoma with microcalcifications,no malignancy id  . Breast biopsy Left 03/07/13    invasive ca,1 lymph node metastatic  . 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 Abdominal hysterectomy    . Coronary stent placement  2001  . Cardiac catheterization    . Eye surgery  6/13,9/14  laser,cataract lft  . Coronary angioplasty      LAD stent 2003  . Portacath placement Right 04/22/2013    Procedure: INSERTION PORT-A-CATH;  Surgeon: Adin Hector, MD;  Location: [facility];  Service: General;  Laterality: Right;  . Mastectomy modified radical Left 04/22/2013    Procedure: MASTECTOMY MODIFIED RADICAL;  Surgeon: Adin Hector, MD;  Location: [facility];  Service: General;  Laterality: Left;  . Breast lumpectomy with needle localization and axillary sentinel lymph node bx Right 04/22/2013    Procedure: BREAST LUMPECTOMY WITH NEEDLE LOCALIZATION AND AXILLARY SENTINEL LYMPH NODE BX;  Surgeon: Adin Hector, MD;  Location: [facility];  Service: General;  Laterality: Right;  . Esophagogastroduodenoscopy N/A 08/07/2013    Procedure: ESOPHAGOGASTRODUODENOSCOPY (EGD);  Surgeon: Missy Sabins, MD;  Location: [facility];  Service: Endoscopy;   Laterality: N/A;    FAMILY HISTORY: Family History  Problem Relation Age of Onset  . Heart disease Mother   . Breast cancer Maternal Aunt     dx in her 57s  . Heart disease Cousin 82    maternal cousin  . Heart attack Sister 89  . Cancer Paternal Aunt     NOS  . Cancer Maternal Aunt     NOS  . Lung cancer Cousin 29    maternal cousin - non smoker  . Brain cancer Cousin 13    maternal cousin's son  . Lung cancer Cousin 26    maternal cousin's son  . Breast cancer Cousin     paternal cousin    SOCIAL HISTORY: History  Substance Use Topics  . Smoking status: Former Smoker -- 1.00 packs/day for 15 years    Types: Cigarettes    Quit date: 07/19/1987  . Smokeless tobacco: Never Used  . Alcohol Use: No    ALLERGIES: Allergies  Allergen Reactions  . Adhesive [Tape] Rash    Clear tape gloves ,elastic no problem     MEDICATIONS:  Current Outpatient Prescriptions  Medication Sig Dispense Refill  . amLODipine (NORVASC) 10 MG tablet Take 10 mg by mouth daily.      . Azilsartan-Chlorthalidone 40-25 MG TABS Take 1 tablet by mouth daily.       [name] buPROPion (WELLBUTRIN SR) 150 MG 12 hr tablet Take 1 tablet (150 mg total) by mouth daily.  30 tablet  6  . gabapentin (NEURONTIN) 100 MG capsule Take 3 capsules (300 mg total) by mouth 3 (three) times daily.  870 capsule  6  . hydrALAZINE (APRESOLINE) 25 MG tablet Take 25 mg by mouth 2 (two) times daily.      [name] HYDROcodone-acetaminophen (NORCO/VICODIN) 5-325 MG per tablet Take 1-2 tablets by mouth every 4 (four) hours as needed.  30 tablet  0  . lidocaine-prilocaine (EMLA) cream Apply topically as needed. Apply to port-a-cath one - two hours before accessing.  30 g  3  . LORazepam (ATIVAN) 0.5 MG tablet Take 1 tablet (0.57m) every 6 hours as needed for nausea or vomiting.  30 tablet  2  . losartan (COZAAR) 100 MG tablet Take 1 tablet (100 mg total) by mouth daily.  30 tablet  6  . metoprolol succinate (TOPROL-XL) 25 MG 24 hr tablet  Take 25 mg by mouth daily.      . ondansetron (ZOFRAN) 8 MG tablet Take 1 tablet (859m) every 12 hours as needed for nausea or vomiting starting the 3rd day after chemotherapy.  30 tablet  1  . pantoprazole (PROTONIX) 40 MG tablet Take  1 tablet (40 mg total) by mouth daily.  30 tablet  3  . pioglitazone-metformin (ACTOPLUS MET) 15-850 MG per tablet Take 1 tablet by mouth 2 (two) times daily with a meal.      . PRESCRIPTION MEDICATION palonosetron (ALOXI) injection 0.25 mg 0.25 mg  Once 08/02/2013 08/02/2013      Route: Intravenous      . PRESCRIPTION MEDICATION epirubicin (ELLENCE) chemo injection 200 mg 100 mg/m2  2 m2 (Order-Specific)  Once 08/02/2013      . PRESCRIPTION MEDICATION fluorouracil (ADRUCIL) chemo injection 1,000 mg 500 mg/m2  2.02 m2 (Treatment Plan Actual)  Once 08/02/2013      . PRESCRIPTION MEDICATION cyclophosphamide (CYTOXAN) 1,000 mg in sodium chloride 0.9 % 250 mL chemo infusion 500 mg/m2  2 m2 (Order-Specific)  Once 08/02/2013      . rosuvastatin (CRESTOR) 5 MG tablet Take 5 mg by mouth daily.      . sitaGLIPtin (JANUVIA) 100 MG tablet Take 100 mg by mouth daily.      [name] dexamethasone (DECADRON) 4 MG tablet Take 1 tablet (4 mg total) by mouth as directed. Take 2 tablets once daily for three days starting the day after chemotherapy  60 tablet  0   No current facility-administered medications for this visit.   Facility-Administered Medications Ordered in Other Visits  Medication Dose Route Frequency Provider Last Rate Last Dose  . sodium chloride 0.9 % injection 10 mL  10 mL Intravenous PRN [name], MD   10 mL at 05/24/13 1522      REVIEW OF SYSTEMS: A 10 point review of systems was completed and is negative except as noted above.   PHYSICAL EXAMINATION: BP 171/73  Pulse 88  Temp(Src) 98.6 F (37 C) (Oral)  Resp 18  Ht 5' 2" (1.575 m)  Wt 202 lb 4.8 oz (91.763 kg)  BMI 36.99 kg/m2  SpO2 97%  GENERAL: Patient is a well appearing female in no acute  distress HEENT:  Sclerae anicteric.  Oropharynx clear and moist. No ulcerations or evidence of oropharyngeal candidiasis. Neck is supple.  NODES:  No cervical, supraclavicular, or axillary lymphadenopathy palpated.  BREAST EXAM:  Deferred. LUNGS:  Clear to auscultation bilaterally.  No wheezes or rhonchi. HEART:  Regular rate and rhythm. No murmur appreciated. ABDOMEN:  Soft, nontender.  Positive, normoactive bowel sounds. No organomegaly palpated. MSK:  No focal spinal tenderness to palpation. Full range of motion bilaterally in the upper extremities. EXTREMITIES:  No peripheral edema.   SKIN:  Clear with no obvious rashes or skin changes. Patient continues to have significant nail dyscrasia.  She has lost her left thumb nail, no residual sign of swelling or infection on the left thumb.  Her other fingernails are hyperpigmented, and appear to be beginning to separate from the nail bed.  There are no signs of infection.  The fingernails appear brittle.   NEURO:  Nonfocal. Well oriented.  Appropriate affect. ECOG FS:  0 - Asymptomatic   LAB RESULTS: Lab Results  Component Value Date   WBC 5.0 10/04/2013   NEUTROABS 3.5 10/04/2013   HGB 10.9* 10/04/2013   HCT 33.3* 10/04/2013   MCV 93.3 10/04/2013   PLT 267 10/04/2013      Chemistry      Component Value Date/Time   NA 138 10/04/2013 0845   NA 136* 08/09/2013 0437   K 4.0 10/04/2013 0845   K 3.8 08/09/2013 0437   CL 100 08/09/2013 0437   CO2 25 10/04/2013 0845  CO2 25 08/09/2013 0437   BUN 10.0 10/04/2013 0845   BUN 8 08/09/2013 0437   CREATININE 0.8 10/04/2013 0845   CREATININE 0.69 08/09/2013 0437      Component Value Date/Time   CALCIUM 9.4 10/04/2013 0845   CALCIUM 8.4 08/09/2013 0437   ALKPHOS 96 10/04/2013 0845   ALKPHOS 103 08/09/2013 0437   AST 13 10/04/2013 0845   AST 8 08/09/2013 0437   ALT 12 10/04/2013 0845   ALT 5 08/09/2013 0437   BILITOT 0.36 10/04/2013 0845   BILITOT 0.2* 08/09/2013 0437       No results found for this  basename: [id number]    No components found with this basename: [id number]     RADIOGRAPHIC STUDIES: 1.    2-D echocardiogram on 04/18/2013 showed a left ventricular ejection fraction of 55% - 60%    ASSESSMENT/PLAN:  Kathy Maxwell is a  58 y.o. woman:  1.  Status post left breast modified radical mastectomy with left axillary lymph node resection and right breast lumpectomy with right axillary node biopsy on 04/22/2013 with pathology results as follows:  Left breast: Stage IIIA, pT3, pN2a, invasive ductal carcinoma with multiple foci, grade 1, largest span was 5.5 cm and ductal carcinoma in situ low-grade, 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% positive, progesterone receptor 23% positive, HER-2/neu no amplification, Ki-67 33%, with 4/22 metastatic lymph nodes and 1 isolated tumor cells identified within left axillary lymph node resection.  Extracapsular extension was present and surgical resection margins were negative for carcinoma.  Right breast: Stage 0, pTis, pN0, 3.0 cm ductal carcinoma in situ with calcifications, intermediate to high-grade, 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% positive, progesterone receptor 11% positive, with 0/4 metastatic right axillary lymph nodes.  Reexcision of right breast medial margin: Ductal carcinoma in situ, intermediate grade spanning 0.8 cm, ductal carcinoma in situ is focally less than 0.1 cm from the new medial margin, lobular neoplasia (atypical lobular hyperplasia).  2.  Referred for genetics counseling/testing. 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   2-D echocardiogram on 04/18/2013 showed a left ventricular ejection fraction of 55% - 60%.   S/p adjuvant chemotherapy with FEC on 05/24/2013 - 07/23/13 X 6 CYCLES  #4 patient is status post 2 cycles of Taxol last 1 administered on 08/23/2013. This was discontinued due to significant the grade 3 with neuropathy.  #5  Patient's CBC is stable.  I reviewed this with her in detail.  She will proceed  with cycle 2 day 1 of Abraxane chemotherapy.  Her CMP is pending.    #6 neuropathy: I have recommended patient continue super B complex. Her neuropathy is improved with Gabapentin 326m TID.  She will continue this.    #7 I referred the patient to podiatry for her toenails and nail pain.  She did not go to the appointment.      #8  I again encouraged the patient to discuss her hypertension with her PCP at her next appointment in 2 weeks.  Her blood pressure continues to be elevated, and we will contact her PCP's office to make sure they know about her persistent hypertension.    #9 patient will be seen back on 10/11/13 for cycle 2 day 8 of Abraxane.  All questions were answered.  Ms. SRitthalerwas encouraged to contact [facility] the interim with any problems, questions or concerns  I spent 25 minutes counseling the patient face to face.  The total time spent in the appointment was 30 minutes.  Minette Headland, [place] [phone]

## 2013-10-08 ENCOUNTER — Telehealth: Payer: Self-pay | Admitting: Oncology

## 2013-10-08 NOTE — Telephone Encounter (Signed)
, °

## 2013-10-10 ENCOUNTER — Other Ambulatory Visit: Payer: Self-pay

## 2013-10-10 NOTE — Progress Notes (Signed)
Dispensing faxed to [employer].  Sent to scan.

## 2013-10-11 ENCOUNTER — Ambulatory Visit (HOSPITAL_BASED_OUTPATIENT_CLINIC_OR_DEPARTMENT_OTHER): Payer: Medicare HMO

## 2013-10-11 ENCOUNTER — Telehealth: Payer: Self-pay | Admitting: Oncology

## 2013-10-11 ENCOUNTER — Ambulatory Visit (HOSPITAL_BASED_OUTPATIENT_CLINIC_OR_DEPARTMENT_OTHER): Payer: Medicare HMO | Admitting: Oncology

## 2013-10-11 ENCOUNTER — Other Ambulatory Visit: Payer: Medicare HMO

## 2013-10-11 ENCOUNTER — Encounter: Payer: Self-pay | Admitting: Oncology

## 2013-10-11 ENCOUNTER — Other Ambulatory Visit (HOSPITAL_BASED_OUTPATIENT_CLINIC_OR_DEPARTMENT_OTHER): Payer: Medicare HMO

## 2013-10-11 VITALS — BP 195/74 | HR 98 | Ht 62.0 in | Wt 201.2 lb

## 2013-10-11 DIAGNOSIS — D0590 Unspecified type of carcinoma in situ of unspecified breast: Secondary | ICD-10-CM

## 2013-10-11 DIAGNOSIS — C773 Secondary and unspecified malignant neoplasm of axilla and upper limb lymph nodes: Secondary | ICD-10-CM

## 2013-10-11 DIAGNOSIS — D6481 Anemia due to antineoplastic chemotherapy: Secondary | ICD-10-CM

## 2013-10-11 DIAGNOSIS — C50419 Malignant neoplasm of upper-outer quadrant of unspecified female breast: Secondary | ICD-10-CM

## 2013-10-11 DIAGNOSIS — Z5111 Encounter for antineoplastic chemotherapy: Secondary | ICD-10-CM

## 2013-10-11 DIAGNOSIS — C50412 Malignant neoplasm of upper-outer quadrant of left female breast: Secondary | ICD-10-CM

## 2013-10-11 DIAGNOSIS — G589 Mononeuropathy, unspecified: Secondary | ICD-10-CM

## 2013-10-11 DIAGNOSIS — T451X5A Adverse effect of antineoplastic and immunosuppressive drugs, initial encounter: Secondary | ICD-10-CM

## 2013-10-11 DIAGNOSIS — I10 Essential (primary) hypertension: Secondary | ICD-10-CM

## 2013-10-11 LAB — COMPREHENSIVE METABOLIC PANEL (CC13)
ALT: 14 U/L (ref 0–55)
ANION GAP: 9 meq/L (ref 3–11)
AST: 11 U/L (ref 5–34)
Albumin: 3.5 g/dL (ref 3.5–5.0)
Alkaline Phosphatase: 97 U/L (ref 40–150)
BILIRUBIN TOTAL: 0.38 mg/dL (ref 0.20–1.20)
BUN: 10.9 mg/dL (ref 7.0–26.0)
CALCIUM: 9.7 mg/dL (ref 8.4–10.4)
CHLORIDE: 101 meq/L (ref 98–109)
CO2: 25 meq/L (ref 22–29)
Creatinine: 0.9 mg/dL (ref 0.6–1.1)
GLUCOSE: 438 mg/dL — AB (ref 70–140)
Potassium: 4 mEq/L (ref 3.5–5.1)
Sodium: 135 mEq/L — ABNORMAL LOW (ref 136–145)
Total Protein: 6.3 g/dL — ABNORMAL LOW (ref 6.4–8.3)

## 2013-10-11 LAB — CBC WITH DIFFERENTIAL/PLATELET
BASO%: 0.7 % (ref 0.0–2.0)
Basophils Absolute: 0 10*3/uL (ref 0.0–0.1)
EOS%: 1.5 % (ref 0.0–7.0)
Eosinophils Absolute: 0.1 10*3/uL (ref 0.0–0.5)
HCT: 32.8 % — ABNORMAL LOW (ref 34.8–46.6)
HGB: 10.9 g/dL — ABNORMAL LOW (ref 11.6–15.9)
LYMPH%: 12.6 % — AB (ref 14.0–49.7)
MCH: 31.3 pg (ref 25.1–34.0)
MCHC: 33.3 g/dL (ref 31.5–36.0)
MCV: 94.1 fL (ref 79.5–101.0)
MONO#: 0.3 10*3/uL (ref 0.1–0.9)
MONO%: 4.7 % (ref 0.0–14.0)
NEUT#: 4.6 10*3/uL (ref 1.5–6.5)
NEUT%: 80.5 % — ABNORMAL HIGH (ref 38.4–76.8)
Platelets: 296 10*3/uL (ref 145–400)
RBC: 3.49 10*6/uL — ABNORMAL LOW (ref 3.70–5.45)
RDW: 15.6 % — AB (ref 11.2–14.5)
WBC: 5.7 10*3/uL (ref 3.9–10.3)
lymph#: 0.7 10*3/uL — ABNORMAL LOW (ref 0.9–3.3)

## 2013-10-11 MED ORDER — ONDANSETRON 8 MG/NS 50 ML IVPB
INTRAVENOUS | Status: AC
  Filled 2013-10-11: qty 8

## 2013-10-11 MED ORDER — ONDANSETRON 8 MG/50ML IVPB (CHCC)
8.0000 mg | Freq: Once | INTRAVENOUS | Status: AC
  Administered 2013-10-11: 8 mg via INTRAVENOUS

## 2013-10-11 MED ORDER — DEXAMETHASONE SODIUM PHOSPHATE 10 MG/ML IJ SOLN
INTRAMUSCULAR | Status: AC
  Filled 2013-10-11: qty 1

## 2013-10-11 MED ORDER — PACLITAXEL PROTEIN-BOUND CHEMO INJECTION 100 MG
100.0000 mg/m2 | Freq: Once | INTRAVENOUS | Status: AC
  Administered 2013-10-11: 200 mg via INTRAVENOUS
  Filled 2013-10-11: qty 40

## 2013-10-11 MED ORDER — DEXAMETHASONE SODIUM PHOSPHATE 10 MG/ML IJ SOLN
10.0000 mg | Freq: Once | INTRAMUSCULAR | Status: AC
  Administered 2013-10-11: 10 mg via INTRAVENOUS

## 2013-10-11 MED ORDER — SODIUM CHLORIDE 0.9 % IJ SOLN
10.0000 mL | INTRAMUSCULAR | Status: DC | PRN
  Administered 2013-10-11: 10 mL
  Filled 2013-10-11: qty 10

## 2013-10-11 MED ORDER — SODIUM CHLORIDE 0.9 % IV SOLN
Freq: Once | INTRAVENOUS | Status: AC
  Administered 2013-10-11: 10:00:00 via INTRAVENOUS

## 2013-10-11 MED ORDER — HEPARIN SOD (PORK) LOCK FLUSH 100 UNIT/ML IV SOLN
500.0000 [IU] | Freq: Once | INTRAVENOUS | Status: AC | PRN
  Administered 2013-10-11: 500 [IU]
  Filled 2013-10-11: qty 5

## 2013-10-11 NOTE — Progress Notes (Signed)
[place] OFFICE PROGRESS NOTE  Patient Care Team: [place]endale Chard, MD as PCP - General (Internal Medicine) Fanny Skates, MD  DIAGNOSIS: 58 y.o. 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   Breast cancer of upper-outer quadrant of left female breast  Primary site: Breast (Left)  Staging method: AJCC 7th Edition  Pathologic: Stage IIIA (T3, N2, cM0) signed by [name], MD on 09/01/2013 11:36 PM  Summary: Stage IIIA (T3, N2, cM0)    SUMMARY OF ONCOLOGIC HISTORY: 1. Screening mammogram in 12/2012 was abnormal which led to left digital diagnostic mammogram and left breast ultrasound on 03/01/2013. Ultrasound of the left breast showed a hypoechoic mass at 12:30, 6 cm from the nipple, that measured 1.9 x 2.6 x 2.9 cm. Ultrasound of the left axilla demonstrates a 1.1 x 0.7 x 0.9 cm lymph node with a borderline thickened cortex.  2. Left breast needle core biopsy at the 12:30 o'clock position and left needle core biopsy of the left axillary lymph node on 03/07/2013 showed invasive mammary carcinoma with features compatible with a grade 1 or 2 ductal carcinoma in the breast needle core biopsy and lymph node was positive for metastatic mammary carcinoma with a macrometastasis and extracapsular extension identified. Estrogen receptor 100% positive, progesterone receptor 75% positive, Ki-67 15%, HER-2/neu by CISH no amplification.  3. Bilateral breast MRI on 03/15/2013 showed in the left breast there were at least 4 focus of abnormal spiculated enhancement in the upper left breast centrally. The entire area measured 7.4 cm x 6.6 cm x 5.1 cm. The previous biopsy focus was posteriorly located and measured 2.1 x 1 x 2.1 cm. The anterior focus that was not  biopsied measured 3.1 x 2.6 x 2.6 cm. A lateral focus that was not biopsied measured 1.1 x 1.4 x 1.6 cm. There were small medial focus measuring cyst 0.7 x 0.8 x 0.7 cm Post biopsy changes were identified in the left axilla. There were abnormal enlarged lymph nodes correlating to the patient's known metastatic neoplasm to the lymph nodes which the largest lymph node measured 1.8 x 0.8 cm. In the right breast there was an irregular focus of 3.3 x 3.1 x 1.3 cm abnormal enhancement in the central right breast in the middle one third. There was a mild thickened cortex and prominent lymph nodes within the right axilla with the largest measuring 1.6 x 1 cm. There was a posterior central upper breast focus with associated clip artifact measuring 1.2 x 2.2 x 1 cm previously biopsied to be benign. Suspicious findings in bilateral breasts.  4. Anterior left breast needle core biopsy and central right breast needle core biopsy on 04/03/2013. Left breast needle core biopsy showed invasive ductal carcinoma and ductal carcinoma in situ, 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% positive, progesterone receptor 23% positive, Ki-67 33%, and HER-2/neu by CISH no amplification. Right breast needle core biopsy showed ductal carcinoma in situ, 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% positive, progesterone receptor 11% positive.  5. Status post left breast modified radical mastectomy with left axillary lymph node resection and right breast lumpectomy with right axillary node biopsy on 04/22/2013 with pathology results as follows:  Left breast: Stage IIIA, pT3, pN2a, invasive ductal carcinoma with multiple foci, grade 1, largest span was 5.5 cm and ductal carcinoma in situ low-grade, 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%  positive, progesterone receptor 23% positive, HER-2/neu no amplification, Ki-67 33%, with 4/22 metastatic lymph nodes and 1 isolated tumor cells identified within left axillary lymph node resection. Extracapsular extension was present and surgical resection margins  were negative for carcinoma.  Right breast: Stage 0, pTis, pN0, 3.0 cm ductal carcinoma in situ with calcifications, intermediate to high-grade, 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% positive, progesterone receptor 11% positive, with 0/4 metastatic right axillary lymph nodes.  Reexcision of right breast medial margin: Ductal carcinoma in situ, intermediate grade spanning 0.8 cm, ductal carcinoma in situ is focally less than 0.1 cm from the new medial margin, lobular neoplasia (atypical lobular hyperplasia).  6. Referred for genetics counseling and testing - negative test results but with ATM VUS  7. status post adjuvant chemotherapy consisting of FEC (5-FU/epirubicin/Cytoxan) completed 05/24/2013 - 07/23/13.   8. Status post 2 cycles of single agent Taxol administered from 08/16/2013 through 08/23/2013. Discontinued early due to to grade 3 neuropathy,  9. Receiving single agent Abraxane on 08/30/13.   CURRENT THERAPY: Cycle 2 day 8 of Abraxane on a 28 day cycle    INTERVAL HISTORY: Kathy Maxwell 58 y.o. female returns for followup visit today. She is now receiving cycle 2 day 8 of Abraxane. She is tolerating it well. She does tell me that her neuropathy in her upper extremities has improved. She still remains with some neuropathy in the lower extremities especially the feet. All of her nails have fallen out. They are beginning to grow. She also is noticing some peach fuzz on her hair. She is otherwise denies any nausea or vomiting. She did get her new mastectomy prosthesis and bra. She is very proud of it. Remainder of the 10 point review of systems is negative and as below  I have reviewed the past medical history, past surgical history, social history and family history with the patient and they are unchanged from previous note.  ALLERGIES:  is allergic to adhesive.  MEDICATIONS:  Current Outpatient Prescriptions  Medication Sig Dispense Refill  . amLODipine (NORVASC) 10 MG tablet Take 10 mg by mouth daily.       . Azilsartan-Chlorthalidone 40-25 MG TABS Take 1 tablet by mouth daily.       [name] buPROPion (WELLBUTRIN SR) 150 MG 12 hr tablet Take 1 tablet (150 mg total) by mouth daily.  30 tablet  6  . dexamethasone (DECADRON) 4 MG tablet Take 1 tablet (4 mg total) by mouth as directed. Take 2 tablets once daily for three days starting the day after chemotherapy  60 tablet  0  . gabapentin (NEURONTIN) 100 MG capsule Take 3 capsules (300 mg total) by mouth 3 (three) times daily.  870 capsule  6  . hydrALAZINE (APRESOLINE) 25 MG tablet Take 25 mg by mouth 2 (two) times daily.      [name] HYDROcodone-acetaminophen (NORCO/VICODIN) 5-325 MG per tablet Take 1-2 tablets by mouth every 4 (four) hours as needed.  30 tablet  0  . lidocaine-prilocaine (EMLA) cream Apply topically as needed. Apply to port-a-cath one - two hours before accessing.  30 g  3  . LORazepam (ATIVAN) 0.5 MG tablet Take 1 tablet (0.$RemoveBef'5mg'[id number]$ ) every 6 hours as needed for nausea or vomiting.  30 tablet  2  . losartan (COZAAR) 100 MG tablet Take 1 tablet (100 mg total) by mouth daily.  30 tablet  6  . metoprolol succinate (TOPROL-XL) 25 MG 24 hr tablet Take 25 mg by mouth daily.      . ondansetron (ZOFRAN)  8 MG tablet Take 1 tablet (8mg s) every 12 hours as needed for nausea or vomiting starting the 3rd day after chemotherapy.  30 tablet  1  . pantoprazole (PROTONIX) 40 MG tablet Take 1 tablet (40 mg total) by mouth daily.  30 tablet  3  . pioglitazone-metformin (ACTOPLUS MET) 15-850 MG per tablet Take 1 tablet by mouth 2 (two) times daily with a meal.      . PRESCRIPTION MEDICATION epirubicin (ELLENCE) chemo injection 200 mg 100 mg/m2  2 m2 (Order-Specific)  Once 08/02/2013      . PRESCRIPTION MEDICATION fluorouracil (ADRUCIL) chemo injection 1,000 mg 500 mg/m2  2.02 m2 (Treatment Plan Actual)  Once 08/02/2013      . PRESCRIPTION MEDICATION cyclophosphamide (CYTOXAN) 1,000 mg in sodium chloride 0.9 % 250 mL chemo infusion 500 mg/m2  2 m2 (Order-Specific)  Once  08/02/2013      . rosuvastatin (CRESTOR) 5 MG tablet Take 5 mg by mouth daily.      . sitaGLIPtin (JANUVIA) 100 MG tablet Take 100 mg by mouth daily.      08/04/2013 PRESCRIPTION MEDICATION palonosetron (ALOXI) injection 0.25 mg 0.25 mg  Once 08/02/2013 08/02/2013      Route: Intravenous       No current facility-administered medications for this visit.   Facility-Administered Medications Ordered in Other Visits  Medication Dose Route Frequency Provider Last Rate Last Dose  . sodium chloride 0.9 % injection 10 mL  10 mL Intravenous PRN 08/04/2013, MD   10 mL at 05/24/13 1522    REVIEW OF SYSTEMS:   Constitutional: Denies fevers, chills or abnormal weight loss Eyes: Denies blurriness of vision Ears, nose, mouth, throat, and face: Denies mucositis or sore throat Respiratory: Denies cough, dyspnea or wheezes Cardiovascular: Denies palpitation, chest discomfort or lower extremity swelling Gastrointestinal:  Denies nausea, heartburn or change in bowel habits Skin: Denies abnormal skin rashes Lymphatics: Denies new lymphadenopathy or easy bruising Neurological: She is noted to have a grade 1 neuropathy in the lower and upper extremity, no new weakness. Behavioral/Psych: Mood is stable, no new changes  All other systems were reviewed with the patient and are negative.  PHYSICAL EXAMINATION: ECOG PERFORMANCE STATUS: 1 - Symptomatic but completely ambulatory  Filed Vitals:   10/11/13 0913  BP: 195/74  Pulse: 98   Filed Weights   10/11/13 0913  Weight: 201 lb 3.2 oz (91.264 kg)    GENERAL:alert, no distress and comfortable SKIN: skin color, texture, turgor are normal, no rashes or significant lesions EYES: normal, Conjunctiva are pink and non-injected, sclera clear OROPHARYNX:no exudate, no erythema and lips, buccal mucosa, and tongue normal  NECK: supple, thyroid normal size, non-tender, without nodularity LYMPH:  no palpable lymphadenopathy in the cervical, axillary or inguinal LUNGS:  clear to auscultation and percussion with normal breathing effort HEART: regular rate & rhythm and no murmurs and no lower extremity edema ABDOMEN:abdomen soft, non-tender and normal bowel sounds Musculoskeletal:no cyanosis of digits and no clubbing  NEURO: alert & oriented x 3 with fluent speech, no focal motor/sensory deficits Left mastectomy scar looks well healed there is no evidence of local recurrence. Right breast well-healed lumpectomy scar no masses or nipple discharge. LABORATORY DATA:  I have reviewed the data as listed    Component Value Date/Time   NA 138 10/04/2013 0845   NA 136* 08/09/2013 0437   K 4.0 10/04/2013 0845   K 3.8 08/09/2013 0437   CL 100 08/09/2013 0437   CO2 25 10/04/2013 0845  CO2 25 08/09/2013 0437   GLUCOSE 317* 10/04/2013 0845   GLUCOSE 158* 08/09/2013 0437   BUN 10.0 10/04/2013 0845   BUN 8 08/09/2013 0437   CREATININE 0.8 10/04/2013 0845   CREATININE 0.69 08/09/2013 0437   CALCIUM 9.4 10/04/2013 0845   CALCIUM 8.4 08/09/2013 0437   PROT 6.2* 10/04/2013 0845   PROT 5.5* 08/09/2013 0437   ALBUMIN 3.2* 10/04/2013 0845   ALBUMIN 2.9* 08/09/2013 0437   AST 13 10/04/2013 0845   AST 8 08/09/2013 0437   ALT 12 10/04/2013 0845   ALT 5 08/09/2013 0437   ALKPHOS 96 10/04/2013 0845   ALKPHOS 103 08/09/2013 0437   BILITOT 0.36 10/04/2013 0845   BILITOT 0.2* 08/09/2013 0437   GFRNONAA >90 08/09/2013 0437   GFRAA >90 08/09/2013 0437    No results found for this basename: SPEP, UPEP,  kappa and lambda light chains    Lab Results  Component Value Date   WBC 5.7 10/11/2013   NEUTROABS 4.6 10/11/2013   HGB 10.9* 10/11/2013   HCT 32.8* 10/11/2013   MCV 94.1 10/11/2013   PLT 296 10/11/2013      Chemistry      Component Value Date/Time   NA 138 10/04/2013 0845   NA 136* 08/09/2013 0437   K 4.0 10/04/2013 0845   K 3.8 08/09/2013 0437   CL 100 08/09/2013 0437   CO2 25 10/04/2013 0845   CO2 25 08/09/2013 0437   BUN 10.0 10/04/2013 0845   BUN 8 08/09/2013 0437   CREATININE 0.8  10/04/2013 0845   CREATININE 0.69 08/09/2013 0437      Component Value Date/Time   CALCIUM 9.4 10/04/2013 0845   CALCIUM 8.4 08/09/2013 0437   ALKPHOS 96 10/04/2013 0845   ALKPHOS 103 08/09/2013 0437   AST 13 10/04/2013 0845   AST 8 08/09/2013 0437   ALT 12 10/04/2013 0845   ALT 5 08/09/2013 0437   BILITOT 0.36 10/04/2013 0845   BILITOT 0.2* 08/09/2013 0437       RADIOGRAPHIC STUDIES: I have personally reviewed the radiological images as listed and agreed with the findings in the report. No results found.    ASSESSMENT & PLAN:  59 year old female with  #1 bilateral breast cancers originally diagnosed in 2014. Patient underwent left breast modified radical mastectomy with left axillary lymph node dissection and right breast lumpectomy with right axillary lymph node biopsy on 04/22/2013. In the left breast she was found to have stage IIIa invasive ductal carcinoma which was multifocal grade 1 largest measuring 5.5 cm with DCIS that was low grade. As [place] receptor positive progesterone receptor positive HER-2/neu negative with a proliferation marker Ki-67 33%. 4 of 22 lymph nodes positive with one isolated tumor cells. There was extracapsular extension. In the right breast stage 0 disease intermediate to high-grade ER positive PR +4 axillary lymph nodes were negative for metastatic disease.  #2 patient is receiving adjuvant chemotherapy. She has completed 6 cycles of FEC completed 07/23/2013. This was then followed by Taxol but unfortunately patient could not tolerate it. Therefore he was switched to Abraxane day 1 day 8 day 15 on a 28 day cycle. Thus far patient is tolerating it well. She is currently receiving cycle 2 day 8. She will proceed with her therapy as scheduled.  #3 neuropathy: Patient is on gabapentin 300 mg 3 times a day. Tolerating it well.   #4 hypertension: Today blood pressure is elevated. We discussed this today. She will contact her primary care doctor regarding  further  management. This could certainly be due to the Decadron.  #5 anemia: Secondary to chemotherapy she is asymptomatic. We will continue to follow her.  No orders of the defined types were placed in this encounter.   All questions were answered. The patient knows to call the clinic with any problems, questions or concerns. No barriers to learning was detected. I spent 20 minutes counseling the patient face to face. The total time spent in the appointment was 30 minutes and more than 50% was on counseling and review of test results and coordination of care     Marcy Panning, MD 10/11/2013 9:23 AM

## 2013-10-11 NOTE — Telephone Encounter (Signed)
, °

## 2013-10-11 NOTE — Patient Instructions (Signed)
[facility] Discharge Instructions for Patients Receiving Chemotherapy  Today you received the following chemotherapy agents Abraxane  To help prevent nausea and vomiting after your treatment, we encourage you to take your nausea medication as prescribed.   If you develop nausea and vomiting that is not controlled by your nausea medication, call the clinic.   BELOW ARE SYMPTOMS THAT SHOULD BE REPORTED IMMEDIATELY:  *FEVER GREATER THAN 100.5 F  *CHILLS WITH OR WITHOUT FEVER  NAUSEA AND VOMITING THAT IS NOT CONTROLLED WITH YOUR NAUSEA MEDICATION  *UNUSUAL SHORTNESS OF BREATH  *UNUSUAL BRUISING OR BLEEDING  TENDERNESS IN MOUTH AND THROAT WITH OR WITHOUT PRESENCE OF ULCERS  *URINARY PROBLEMS  *BOWEL PROBLEMS  UNUSUAL RASH Items with * indicate a potential emergency and should be followed up as soon as possible.  Feel free to call the clinic you have any questions or concerns. The clinic phone number is [phone].    

## 2013-10-14 ENCOUNTER — Telehealth: Payer: Self-pay | Admitting: Oncology

## 2013-10-14 NOTE — Telephone Encounter (Signed)
pt seen 3/27 and was to have lb/fu/tx 4/2. message sent to provider re appt d/t. pt called today to check on status and she is do to go out of town and needs appts set. no response from provider. pt agreeable to coming in the day b4 tx. pt given appts for 4/1 lb/fu and 4/2 tx. other tx appts added and pt will get new schedule 4/1.

## 2013-10-15 NOTE — Progress Notes (Signed)
Hematology and Oncology Follow Up Visit  Kathy Maxwell [id number] Dec 15, 1955 58 y.o. 10/17/2013 10:34 PM     Principle Diagnosis:Kathy Maxwell 58 y.o. 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     Prior Therapy: 1. Screening mammogram in 12/2012 was abnormal which led to left digital diagnostic mammogram and left breast ultrasound on 03/01/2013. Ultrasound of the left breast showed a hypoechoic mass at 12:30, 6 cm from the nipple, that measured 1.9 x 2.6 x 2.9 cm. Ultrasound of the left axilla demonstrates a 1.1 x 0.7 x 0.9 cm lymph node with a borderline thickened cortex.   2. Left breast needle core biopsy at the 12:30 o'clock position and left needle core biopsy of the left axillary lymph node on 03/07/2013 showed invasive mammary carcinoma with features compatible with a grade 1 or 2 ductal carcinoma in the breast needle core biopsy and lymph node was positive for metastatic mammary carcinoma with a macrometastasis and extracapsular extension identified. Estrogen receptor 100% positive, progesterone receptor 75% positive, Ki-67 15%, HER-2/neu by CISH no amplification.   3. Bilateral breast MRI on 03/15/2013 showed in the left breast there were at least 4 focus of abnormal spiculated enhancement in the upper left breast centrally. The entire area measured 7.4 cm x 6.6 cm x 5.1 cm. The previous biopsy focus was posteriorly located and measured 2.1 x 1 x 2.1 cm. The anterior focus that was not biopsied measured 3.1 x 2.6 x 2.6 cm. A lateral focus that was not biopsied measured 1.1 x 1.4 x 1.6 cm. There were small medial focus measuring cyst 0.7 x 0.8 x 0.7 cm Post biopsy changes were identified in the left axilla. There were abnormal enlarged lymph nodes correlating to the patient's known metastatic neoplasm to the lymph nodes which the largest lymph node measured 1.8 x 0.8 cm. In the right breast there was an irregular focus of  3.3 x 3.1 x 1.3 cm abnormal enhancement in the central right breast in the middle one third. There was a mild thickened cortex and prominent lymph nodes within the right axilla with the largest measuring 1.6 x 1 cm. There was a posterior central upper breast focus with associated clip artifact measuring 1.2 x 2.2 x 1 cm previously biopsied to be benign. Suspicious findings in bilateral breasts.   4. Anterior left breast needle core biopsy and central right breast needle core biopsy on 04/03/2013. Left breast needle core biopsy showed invasive ductal carcinoma and ductal carcinoma in situ, 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% positive, progesterone receptor 23% positive, Ki-67 33%, and HER-2/neu by CISH no amplification. Right breast needle core biopsy showed ductal carcinoma in situ, 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% positive, progesterone receptor 11% positive.   5. Status post left breast modified radical mastectomy with left axillary lymph node resection and right breast lumpectomy with right axillary node biopsy on 04/22/2013 with pathology results as follows:  Left breast: Stage IIIA, pT3, pN2a, invasive ductal carcinoma with multiple foci, grade 1, largest span was 5.5 cm and ductal carcinoma in situ low-grade, 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% positive, progesterone receptor 23% positive, HER-2/neu no amplification, Ki-67 33%, with 4/22 metastatic lymph nodes and 1 isolated tumor cells identified within left axillary lymph node resection. Extracapsular extension was present and surgical resection margins were negative for carcinoma.  Right breast: Stage 0, pTis, pN0, 3.0 cm ductal carcinoma in situ with calcifications, intermediate to high-grade, 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% positive, progesterone receptor 11% positive, with  0/4 metastatic right axillary lymph nodes.  Reexcision of right breast medial margin: Ductal carcinoma in situ, intermediate grade spanning 0.8 cm, ductal carcinoma in situ is focally less than 0.1 cm from the new medial  margin, lobular neoplasia (atypical lobular hyperplasia).   6. 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 status post adjuvant chemotherapy consisting of FEC (5-FU/epirubicin/Cytoxan) completed 05/24/2013 - 07/23/13.   8. Status post 2 cycles of single agent Taxol administered from 08/16/2013 through 08/23/2013. 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 Receiving single agent Abraxane on 08/30/13.   Current therapy:  Abraxane  Interim History: Kathy Maxwell 58 y.o. female is here today for follow up prior to receiving Abraxane chemotherapy tomorrow.  She is doing well today.  She is concerned due to a "bump" about 1cm laterally from her left eye.  She says that it started about a week ago and that she popped it last week and pus came out.  She denies any tenderness to the area, fevers, chills.  She is planning on going out of town next week and needs to reschedule her nutrition appointment.  Her blood pressure today is elevated.  She tells me that she is taking her anti-hypertensives as prescribed.  She has f/u with her PCP about this issue today.  The neuropathy in her fingertips and toes is much improved.  She has no changes in balance, or activities of daily living due to the neuropathy.  She does have nail dyscrasia on her fingernails.  She tells me that her toenails are sore, and beginning to separate from the nail beds.  Otherwise, she denies fevers, chills, nausea, vomiting, constipation, diarrhea, mucositis, skin changes or any other concerns.    Medications:  Current Outpatient Prescriptions  Medication Sig Dispense Refill  . amLODipine (NORVASC) 10 MG tablet Take 10 mg by mouth daily.      . Azilsartan-Chlorthalidone 40-25 MG TABS Take 1 tablet by mouth daily.       [name] buPROPion (WELLBUTRIN SR) 150 MG 12 hr tablet Take 1 tablet (150 mg total) by mouth daily.  30 tablet  6  . gabapentin (NEURONTIN) 100 MG capsule Take 3 capsules  (300 mg total) by mouth 3 (three) times daily.  870 capsule  6  . hydrALAZINE (APRESOLINE) 25 MG tablet Take 25 mg by mouth 2 (two) times daily.      [name] lidocaine-prilocaine (EMLA) cream Apply topically as needed. Apply to port-a-cath one - two hours before accessing.  30 g  3  . losartan (COZAAR) 100 MG tablet Take 1 tablet (100 mg total) by mouth daily.  30 tablet  6  . metoprolol succinate (TOPROL-XL) 25 MG 24 hr tablet Take 25 mg by mouth daily.      . pantoprazole (PROTONIX) 40 MG tablet Take 1 tablet (40 mg total) by mouth daily.  30 tablet  3  . pioglitazone-metformin (ACTOPLUS MET) 15-850 MG per tablet Take 1 tablet by mouth 2 (two) times daily with a meal.      . PRESCRIPTION MEDICATION palonosetron (ALOXI) injection 0.25 mg 0.25 mg  Once 08/02/2013 08/02/2013      Route: Intravenous      . PRESCRIPTION MEDICATION epirubicin (ELLENCE) chemo injection 200 mg 100 mg/m2  2 m2 (Order-Specific)  Once 08/02/2013      . PRESCRIPTION MEDICATION fluorouracil (ADRUCIL) chemo injection 1,000 mg 500 mg/m2  2.02 m2 (Treatment Plan Actual)  Once  08/02/2013      . PRESCRIPTION MEDICATION cyclophosphamide (CYTOXAN) 1,000 mg in sodium chloride 0.9 % 250 mL chemo infusion 500 mg/m2  2 m2 (Order-Specific)  Once 08/02/2013      . rosuvastatin (CRESTOR) 5 MG tablet Take 5 mg by mouth daily.      . sitaGLIPtin (JANUVIA) 100 MG tablet Take 100 mg by mouth daily.      [name] dexamethasone (DECADRON) 4 MG tablet Take 1 tablet (4 mg total) by mouth as directed. Take 2 tablets once daily for three days starting the day after chemotherapy  60 tablet  0  . HYDROcodone-acetaminophen (NORCO/VICODIN) 5-325 MG per tablet Take 1-2 tablets by mouth every 4 (four) hours as needed.  30 tablet  0  . LORazepam (ATIVAN) 0.5 MG tablet Take 1 tablet (0.$RemoveBef'5mg'OdkfCJccpX$ ) every 6 hours as needed for nausea or vomiting.  30 tablet  2  . ondansetron (ZOFRAN) 8 MG tablet Take 1 tablet ($RemoveB'8mg'yDFqJKJA$ s) every 12 hours as needed for nausea or vomiting starting the 3rd  day after chemotherapy.  30 tablet  1   No current facility-administered medications for this visit.   Facility-Administered Medications Ordered in Other Visits  Medication Dose Route Frequency Provider Last Rate Last Dose  . sodium chloride 0.9 % injection 10 mL  10 mL Intravenous PRN [name], MD   10 mL at 05/24/13 1522     Allergies:  Allergies  Allergen Reactions  . Adhesive [Tape] Rash    Clear tape gloves ,elastic no problem    Medical History: Past Medical History  Diagnosis Date  . Hyperlipidemia   . Hypertension   . Bipolar 1 disorder   . Cancer   . Hypertension 04/04/2013  . Hyperlipidemia 04/04/2013  . Bipolar 1 disorder 04/04/2013  . Breast cancer 03/10/13    left breast invasive ca  . Allergy     latex  . Obesity   . Diabetes mellitus without complication     NIDDM  . Sleep apnea     no cpap due to insurance  . Heart murmur   . GERD (gastroesophageal reflux disease)   . Coronary artery disease     Surgical History:  Past Surgical History  Procedure Laterality Date  . Cholecystectomy    . Breast biopsy Left 05/08/09    fibroadenoma with microcalcifications,no malignancy id  . Breast biopsy Left 03/07/13    invasive ca,1 lymph node metastatic  . 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 Abdominal hysterectomy    . Coronary stent placement  2001  . Cardiac catheterization    . Eye surgery  6/13,9/14    laser,cataract lft  . Coronary angioplasty      LAD stent 2003  . Portacath placement Right 04/22/2013    Procedure: INSERTION PORT-A-CATH;  Surgeon: Adin Hector, MD;  Location: [facility];  Service: General;  Laterality: Right;  . Mastectomy modified radical Left 04/22/2013    Procedure: MASTECTOMY MODIFIED RADICAL;  Surgeon: Adin Hector, MD;  Location: [facility];  Service: General;  Laterality: Left;  . Breast lumpectomy with needle localization and axillary sentinel lymph node bx Right  04/22/2013    Procedure: BREAST LUMPECTOMY WITH NEEDLE LOCALIZATION AND AXILLARY SENTINEL LYMPH NODE BX;  Surgeon: Adin Hector, MD;  Location: [facility];  Service: General;  Laterality: Right;  . Esophagogastroduodenoscopy N/A 08/07/2013    Procedure: ESOPHAGOGASTRODUODENOSCOPY (EGD);  Surgeon: Missy Sabins, MD;  Location: [facility];  Service: Endoscopy;  Laterality: N/A;     Review of Systems: A 10 point review of systems was conducted and is otherwise negative except for what is noted above.     Physical Exam: Blood pressure 187/83, pulse 84, temperature 98.3 F (36.8 C), temperature source Oral, resp. rate 18, height 5\' 2"  (1.575 m), weight 196 lb 4.8 oz (89.041 kg). GENERAL: Patient is a well appearing female in no acute distress HEENT:  Sclerae anicteric.  Oropharynx clear and moist. No ulcerations or evidence of oropharyngeal candidiasis. Neck is supple.  NODES:  No cervical, supraclavicular, or axillary lymphadenopathy palpated.  BREAST EXAM:  Deferred. LUNGS:  Clear to auscultation bilaterally.  No wheezes or rhonchi. HEART:  Regular rate and rhythm. No murmur appreciated. ABDOMEN:  Soft, nontender.  Positive, normoactive bowel sounds. No organomegaly palpated. MSK:  No focal spinal tenderness to palpation. Full range of motion bilaterally in the upper extremities. EXTREMITIES:  No peripheral edema.   SKIN:  Clear with no obvious rashes or skin changes. Significant darkening of the fingernails.  They appear brittle.  She has lost her right thumb nail and index finger nail.   NEURO:  Nonfocal. Well oriented.  Appropriate affect. ECOG PERFORMANCE STATUS: 1 - Symptomatic but completely ambulatory   Lab Results: Lab Results  Component Value Date   WBC 3.4* 10/16/2013   HGB 11.6 10/16/2013   HCT 35.1 10/16/2013   MCV 94.5 10/16/2013   PLT 313 10/16/2013     Chemistry      Component Value Date/Time   NA 136 10/16/2013 0820   NA 136* 08/09/2013 0437   K 3.9 10/16/2013 0820   K 3.8  08/09/2013 0437   CL 100 08/09/2013 0437   CO2 25 10/16/2013 0820   CO2 25 08/09/2013 0437   BUN 12.9 10/16/2013 0820   BUN 8 08/09/2013 0437   CREATININE 0.9 10/16/2013 0820   CREATININE 0.69 08/09/2013 0437      Component Value Date/Time   CALCIUM 9.5 10/16/2013 0820   CALCIUM 8.4 08/09/2013 0437   ALKPHOS 99 10/16/2013 0820   ALKPHOS 103 08/09/2013 0437   AST 9 10/16/2013 0820   AST 8 08/09/2013 0437   ALT 11 10/16/2013 0820   ALT 5 08/09/2013 0437   BILITOT 0.55 10/16/2013 0820   BILITOT 0.2* 08/09/2013 0437       Assessment and Plan: Kathy Maxwell 58 y.o. female  1. Bilateral breast cancer: Patient was diagnosed with bilateral breast cancer in August, 2014.  She has underwent a left mastectomy revealing stage IIIA invasive ductal carcinoma and a right breast lumpectomy revealing DCIS.  She has completed adjuvant FEC chemotherapy and is currently receiving adjuvant single agent Abraxane.  She receives Abraxane on day 1, day 8, and day 15 of a 28 day cycle.  She will proceed with cycle 2 day 15 tomorrow.  Her CBC is stable.  I reviewed it with her in detail.  A CMP is pending.    2. Neuropathy:  Patient's neuropathy is minimal.  I recommended she continue Gabapentin TID.    3. Hypertension: She will f/u with her PCP about this today.  She takes Dexamethasone 4mg  daily for three days following chemotherapy.  I recommended that she not take this medication following chemotherapy and we will see how she does.  4.  Nail dyscrasia:  Fingernails are brittle and separating from the nail bed.  She will continue to use tea tree oil to her finger and toe nails.  5.  Left pustular  lesion on face:  I recommended warm compresses and washing the face and area BID and keeping it clean.    The patient will cancel her 4/10 lab and evaluation appointment and reschedule her nutrition appointment with Dory Peru.  She will return to clinic on 11/01/13 for labs, evaluation, and cycle 3 day 1 of Abraxane chemotherapy.  She  knows to call us in the interim if she has any questions or concerns.  We can certainly see her sooner if needed.    I spent 25 minutes counseling the patient face to face.  The total time spent in the appointment was 30 minutes.  Minette Headland, [place] [phone] 10/17/2013 10:34 PM

## 2013-10-16 ENCOUNTER — Other Ambulatory Visit (HOSPITAL_BASED_OUTPATIENT_CLINIC_OR_DEPARTMENT_OTHER): Payer: Medicare HMO

## 2013-10-16 ENCOUNTER — Encounter: Payer: Self-pay | Admitting: Adult Health

## 2013-10-16 ENCOUNTER — Telehealth: Payer: Self-pay | Admitting: Adult Health

## 2013-10-16 ENCOUNTER — Ambulatory Visit (HOSPITAL_BASED_OUTPATIENT_CLINIC_OR_DEPARTMENT_OTHER): Payer: Medicare HMO | Admitting: Adult Health

## 2013-10-16 VITALS — BP 187/83 | HR 84 | Temp 98.3°F | Resp 18 | Ht 62.0 in | Wt 196.3 lb

## 2013-10-16 DIAGNOSIS — D0590 Unspecified type of carcinoma in situ of unspecified breast: Secondary | ICD-10-CM

## 2013-10-16 DIAGNOSIS — C50412 Malignant neoplasm of upper-outer quadrant of left female breast: Secondary | ICD-10-CM

## 2013-10-16 DIAGNOSIS — C773 Secondary and unspecified malignant neoplasm of axilla and upper limb lymph nodes: Secondary | ICD-10-CM

## 2013-10-16 DIAGNOSIS — G5690 Unspecified mononeuropathy of unspecified upper limb: Secondary | ICD-10-CM

## 2013-10-16 DIAGNOSIS — C50419 Malignant neoplasm of upper-outer quadrant of unspecified female breast: Secondary | ICD-10-CM

## 2013-10-16 DIAGNOSIS — L989 Disorder of the skin and subcutaneous tissue, unspecified: Secondary | ICD-10-CM

## 2013-10-16 DIAGNOSIS — I10 Essential (primary) hypertension: Secondary | ICD-10-CM

## 2013-10-16 DIAGNOSIS — L609 Nail disorder, unspecified: Secondary | ICD-10-CM

## 2013-10-16 DIAGNOSIS — G5790 Unspecified mononeuropathy of unspecified lower limb: Secondary | ICD-10-CM

## 2013-10-16 LAB — CBC WITH DIFFERENTIAL/PLATELET
BASO%: 0.8 % (ref 0.0–2.0)
Basophils Absolute: 0 10*3/uL (ref 0.0–0.1)
EOS ABS: 0.1 10*3/uL (ref 0.0–0.5)
EOS%: 1.6 % (ref 0.0–7.0)
HEMATOCRIT: 35.1 % (ref 34.8–46.6)
HGB: 11.6 g/dL (ref 11.6–15.9)
LYMPH%: 16.7 % (ref 14.0–49.7)
MCH: 31.1 pg (ref 25.1–34.0)
MCHC: 32.9 g/dL (ref 31.5–36.0)
MCV: 94.5 fL (ref 79.5–101.0)
MONO#: 0.2 10*3/uL (ref 0.1–0.9)
MONO%: 4.6 % (ref 0.0–14.0)
NEUT%: 76.3 % (ref 38.4–76.8)
NEUTROS ABS: 2.6 10*3/uL (ref 1.5–6.5)
PLATELETS: 313 10*3/uL (ref 145–400)
RBC: 3.72 10*6/uL (ref 3.70–5.45)
RDW: 16.2 % — ABNORMAL HIGH (ref 11.2–14.5)
WBC: 3.4 10*3/uL — ABNORMAL LOW (ref 3.9–10.3)
lymph#: 0.6 10*3/uL — ABNORMAL LOW (ref 0.9–3.3)

## 2013-10-16 LAB — COMPREHENSIVE METABOLIC PANEL (CC13)
ALK PHOS: 99 U/L (ref 40–150)
ALT: 11 U/L (ref 0–55)
AST: 9 U/L (ref 5–34)
Albumin: 3.6 g/dL (ref 3.5–5.0)
Anion Gap: 9 mEq/L (ref 3–11)
BUN: 12.9 mg/dL (ref 7.0–26.0)
CO2: 25 meq/L (ref 22–29)
Calcium: 9.5 mg/dL (ref 8.4–10.4)
Chloride: 102 mEq/L (ref 98–109)
Creatinine: 0.9 mg/dL (ref 0.6–1.1)
Glucose: 391 mg/dl — ABNORMAL HIGH (ref 70–140)
Potassium: 3.9 mEq/L (ref 3.5–5.1)
SODIUM: 136 meq/L (ref 136–145)
TOTAL PROTEIN: 6.5 g/dL (ref 6.4–8.3)
Total Bilirubin: 0.55 mg/dL (ref 0.20–1.20)

## 2013-10-16 NOTE — Telephone Encounter (Signed)
, °

## 2013-10-16 NOTE — Patient Instructions (Signed)
Do not take Dexamethasone after your Abraxane chemotherapy.  Apply warm compresses to the skin lesion twice a day and keep the area clean.  Continue soaking your nails in tea tree oil.  We will reschedule your appointment with Dory Peru, the nutritionist.    We will see you back on 11/01/13.  Please call Kathy Maxwell if you have any questions or concerns.

## 2013-10-17 ENCOUNTER — Other Ambulatory Visit: Payer: Self-pay | Admitting: *Deleted

## 2013-10-17 ENCOUNTER — Ambulatory Visit (HOSPITAL_BASED_OUTPATIENT_CLINIC_OR_DEPARTMENT_OTHER): Payer: Medicare HMO

## 2013-10-17 VITALS — BP 165/68 | HR 86 | Temp 98.3°F

## 2013-10-17 DIAGNOSIS — C50419 Malignant neoplasm of upper-outer quadrant of unspecified female breast: Secondary | ICD-10-CM

## 2013-10-17 DIAGNOSIS — C773 Secondary and unspecified malignant neoplasm of axilla and upper limb lymph nodes: Secondary | ICD-10-CM

## 2013-10-17 DIAGNOSIS — C50412 Malignant neoplasm of upper-outer quadrant of left female breast: Secondary | ICD-10-CM

## 2013-10-17 DIAGNOSIS — Z5111 Encounter for antineoplastic chemotherapy: Secondary | ICD-10-CM

## 2013-10-17 MED ORDER — CLONIDINE HCL 0.1 MG PO TABS
ORAL_TABLET | ORAL | Status: AC
  Filled 2013-10-17: qty 2

## 2013-10-17 MED ORDER — ONDANSETRON 8 MG/50ML IVPB (CHCC)
8.0000 mg | Freq: Once | INTRAVENOUS | Status: AC
  Administered 2013-10-17: 8 mg via INTRAVENOUS

## 2013-10-17 MED ORDER — SODIUM CHLORIDE 0.9 % IJ SOLN
10.0000 mL | INTRAMUSCULAR | Status: DC | PRN
  Administered 2013-10-17: 10 mL
  Filled 2013-10-17: qty 10

## 2013-10-17 MED ORDER — DEXAMETHASONE SODIUM PHOSPHATE 10 MG/ML IJ SOLN
INTRAMUSCULAR | Status: AC
  Filled 2013-10-17: qty 1

## 2013-10-17 MED ORDER — SODIUM CHLORIDE 0.9 % IV SOLN
Freq: Once | INTRAVENOUS | Status: AC
  Administered 2013-10-17: 08:00:00 via INTRAVENOUS

## 2013-10-17 MED ORDER — ONDANSETRON 8 MG/NS 50 ML IVPB
INTRAVENOUS | Status: AC
  Filled 2013-10-17: qty 8

## 2013-10-17 MED ORDER — CLONIDINE HCL 0.1 MG PO TABS
0.2000 mg | ORAL_TABLET | Freq: Once | ORAL | Status: AC
  Administered 2013-10-17: 0.2 mg via ORAL

## 2013-10-17 MED ORDER — DEXAMETHASONE SODIUM PHOSPHATE 10 MG/ML IJ SOLN
10.0000 mg | Freq: Once | INTRAMUSCULAR | Status: AC
  Administered 2013-10-17: 10 mg via INTRAVENOUS

## 2013-10-17 MED ORDER — HEPARIN SOD (PORK) LOCK FLUSH 100 UNIT/ML IV SOLN
500.0000 [IU] | Freq: Once | INTRAVENOUS | Status: AC | PRN
  Administered 2013-10-17: 500 [IU]
  Filled 2013-10-17: qty 5

## 2013-10-17 MED ORDER — PACLITAXEL PROTEIN-BOUND CHEMO INJECTION 100 MG
100.0000 mg/m2 | Freq: Once | INTRAVENOUS | Status: AC
  Administered 2013-10-17: 200 mg via INTRAVENOUS
  Filled 2013-10-17: qty 40

## 2013-10-17 NOTE — Patient Instructions (Signed)
[facility] Discharge Instructions for Patients Receiving Chemotherapy  Today you received the following chemotherapy agents Abraxane  To help prevent nausea and vomiting after your treatment, we encourage you to take your nausea medication as prescribed.   If you develop nausea and vomiting that is not controlled by your nausea medication, call the clinic.   BELOW ARE SYMPTOMS THAT SHOULD BE REPORTED IMMEDIATELY:  *FEVER GREATER THAN 100.5 F  *CHILLS WITH OR WITHOUT FEVER  NAUSEA AND VOMITING THAT IS NOT CONTROLLED WITH YOUR NAUSEA MEDICATION  *UNUSUAL SHORTNESS OF BREATH  *UNUSUAL BRUISING OR BLEEDING  TENDERNESS IN MOUTH AND THROAT WITH OR WITHOUT PRESENCE OF ULCERS  *URINARY PROBLEMS  *BOWEL PROBLEMS  UNUSUAL RASH Items with * indicate a potential emergency and should be followed up as soon as possible.  Feel free to call the clinic you have any questions or concerns. The clinic phone number is [phone].    

## 2013-10-17 NOTE — Progress Notes (Signed)
BP 192/97. Patient stated that she did not take blood pressure meds this morning. Order received from [facility] for Clonidine 0.2mg .

## 2013-10-18 ENCOUNTER — Ambulatory Visit: Payer: Medicare HMO | Admitting: Adult Health

## 2013-10-18 ENCOUNTER — Ambulatory Visit: Payer: Medicare HMO

## 2013-10-18 ENCOUNTER — Other Ambulatory Visit: Payer: Medicare HMO

## 2013-10-23 ENCOUNTER — Encounter: Payer: Self-pay | Admitting: *Deleted

## 2013-10-25 ENCOUNTER — Ambulatory Visit: Payer: Medicare HMO

## 2013-10-25 ENCOUNTER — Encounter: Payer: Medicare HMO | Admitting: Nutrition

## 2013-10-25 ENCOUNTER — Ambulatory Visit: Payer: Medicare HMO | Admitting: Adult Health

## 2013-10-25 ENCOUNTER — Encounter: Payer: Self-pay | Admitting: *Deleted

## 2013-10-25 ENCOUNTER — Other Ambulatory Visit: Payer: Medicare HMO

## 2013-10-28 ENCOUNTER — Telehealth: Payer: Self-pay | Admitting: Oncology

## 2013-10-28 NOTE — Telephone Encounter (Signed)
pt called and r/s nutrition appt to 4/20, nutrition notified

## 2013-10-31 ENCOUNTER — Encounter: Payer: Medicare HMO | Admitting: Nutrition

## 2013-11-01 ENCOUNTER — Ambulatory Visit (HOSPITAL_BASED_OUTPATIENT_CLINIC_OR_DEPARTMENT_OTHER): Payer: Medicare HMO | Admitting: Adult Health

## 2013-11-01 ENCOUNTER — Other Ambulatory Visit (HOSPITAL_BASED_OUTPATIENT_CLINIC_OR_DEPARTMENT_OTHER): Payer: Medicare HMO

## 2013-11-01 ENCOUNTER — Ambulatory Visit: Payer: Medicare HMO

## 2013-11-01 ENCOUNTER — Telehealth: Payer: Self-pay

## 2013-11-01 ENCOUNTER — Encounter: Payer: Self-pay | Admitting: Adult Health

## 2013-11-01 VITALS — BP 191/81 | HR 99 | Temp 98.4°F | Resp 18 | Ht 62.0 in | Wt 203.1 lb

## 2013-11-01 DIAGNOSIS — R059 Cough, unspecified: Secondary | ICD-10-CM

## 2013-11-01 DIAGNOSIS — C50419 Malignant neoplasm of upper-outer quadrant of unspecified female breast: Secondary | ICD-10-CM

## 2013-11-01 DIAGNOSIS — G609 Hereditary and idiopathic neuropathy, unspecified: Secondary | ICD-10-CM

## 2013-11-01 DIAGNOSIS — R42 Dizziness and giddiness: Secondary | ICD-10-CM

## 2013-11-01 DIAGNOSIS — C50412 Malignant neoplasm of upper-outer quadrant of left female breast: Secondary | ICD-10-CM

## 2013-11-01 DIAGNOSIS — R05 Cough: Secondary | ICD-10-CM

## 2013-11-01 DIAGNOSIS — I10 Essential (primary) hypertension: Secondary | ICD-10-CM

## 2013-11-01 DIAGNOSIS — D698 Other specified hemorrhagic conditions: Secondary | ICD-10-CM

## 2013-11-01 DIAGNOSIS — J069 Acute upper respiratory infection, unspecified: Secondary | ICD-10-CM

## 2013-11-01 LAB — COMPREHENSIVE METABOLIC PANEL (CC13)
ALBUMIN: 3.3 g/dL — AB (ref 3.5–5.0)
ALT: 13 U/L (ref 0–55)
AST: 17 U/L (ref 5–34)
Alkaline Phosphatase: 95 U/L (ref 40–150)
Anion Gap: 12 mEq/L — ABNORMAL HIGH (ref 3–11)
BUN: 10.5 mg/dL (ref 7.0–26.0)
CALCIUM: 9.5 mg/dL (ref 8.4–10.4)
CHLORIDE: 100 meq/L (ref 98–109)
CO2: 27 meq/L (ref 22–29)
Creatinine: 0.8 mg/dL (ref 0.6–1.1)
GLUCOSE: 250 mg/dL — AB (ref 70–140)
Potassium: 3.5 mEq/L (ref 3.5–5.1)
Sodium: 139 mEq/L (ref 136–145)
Total Bilirubin: 0.78 mg/dL (ref 0.20–1.20)
Total Protein: 6.5 g/dL (ref 6.4–8.3)

## 2013-11-01 LAB — CBC WITH DIFFERENTIAL/PLATELET
BASO%: 0.8 % (ref 0.0–2.0)
BASOS ABS: 0 10*3/uL (ref 0.0–0.1)
EOS%: 0.8 % (ref 0.0–7.0)
Eosinophils Absolute: 0 10*3/uL (ref 0.0–0.5)
HEMATOCRIT: 32.8 % — AB (ref 34.8–46.6)
HEMOGLOBIN: 10.7 g/dL — AB (ref 11.6–15.9)
LYMPH%: 18.6 % (ref 14.0–49.7)
MCH: 29.9 pg (ref 25.1–34.0)
MCHC: 32.6 g/dL (ref 31.5–36.0)
MCV: 91.6 fL (ref 79.5–101.0)
MONO#: 1 10*3/uL — AB (ref 0.1–0.9)
MONO%: 19 % — ABNORMAL HIGH (ref 0.0–14.0)
NEUT#: 3.1 10*3/uL (ref 1.5–6.5)
NEUT%: 60.8 % (ref 38.4–76.8)
Platelets: 279 10*3/uL (ref 145–400)
RBC: 3.58 10*6/uL — ABNORMAL LOW (ref 3.70–5.45)
RDW: 15.1 % — ABNORMAL HIGH (ref 11.2–14.5)
WBC: 5 10*3/uL (ref 3.9–10.3)
lymph#: 0.9 10*3/uL (ref 0.9–3.3)
nRBC: 1 % — ABNORMAL HIGH (ref 0–0)

## 2013-11-01 MED ORDER — LEVOFLOXACIN 500 MG PO TABS: 500.0000 mg | ORAL_TABLET | Freq: Every day | ORAL | Status: DC

## 2013-11-01 MED ORDER — FLUTICASONE PROPIONATE 50 MCG/ACT NA SUSP: 1.0000 | Freq: Every day | NASAL | Status: DC

## 2013-11-01 NOTE — Telephone Encounter (Signed)
LMOVM - CXR has been ordered - pt to go to [facility] radiology department to obtain xray - no appt required.  Please stop by clinic when at [facility] to pick up AVS - will be at front desk.

## 2013-11-01 NOTE — Progress Notes (Signed)
Hematology and Oncology Follow Up Visit  Kathy Maxwell [id number] May 19, 1956 58 y.o. 11/03/2013 8:02 AM     Principle Diagnosis:Kathy Maxwell 58 y.o. 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 Screening mammogram in 12/2012 was abnormal which led to left digital diagnostic mammogram and left breast ultrasound on 03/01/2013. Ultrasound of the left breast showed a hypoechoic mass at 12:30, 6 cm from the nipple, that measured 1.9 x 2.6 x 2.9 cm. Ultrasound of the left axilla demonstrates a 1.1 x 0.7 x 0.9 cm lymph node with a borderline thickened cortex.   2. Left breast needle core biopsy at the 12:30 o'clock position and left needle core biopsy of the left axillary lymph node on 03/07/2013 showed invasive mammary carcinoma with features compatible with a grade 1 or 2 ductal carcinoma in the breast needle core biopsy and lymph node was positive for metastatic mammary carcinoma with a macrometastasis and extracapsular extension identified. Estrogen receptor 100% positive, progesterone receptor 75% positive, Ki-67 15%, HER-2/neu by CISH no amplification.   3. Bilateral breast MRI on 03/15/2013 showed in the left breast there were at least 4 focus of abnormal spiculated enhancement in the upper left breast centrally. The entire area measured 7.4 cm x 6.6 cm x 5.1 cm. The previous biopsy focus was posteriorly located and measured 2.1 x 1 x 2.1 cm. The anterior focus that was not biopsied measured 3.1 x 2.6 x 2.6 cm. A lateral focus that was not biopsied measured 1.1 x 1.4 x 1.6 cm. There were small medial focus measuring cyst 0.7 x 0.8 x 0.7 cm Post biopsy changes were identified in the left axilla. There were abnormal enlarged lymph nodes correlating to the patient's known metastatic neoplasm to the lymph nodes which the largest lymph node measured 1.8 x 0.8 cm. In the right breast there was an irregular focus of  3.3 x 3.1 x 1.3 cm abnormal enhancement in the central right breast in the middle one third. There was a mild thickened cortex and prominent lymph nodes within the right axilla with the largest measuring 1.6 x 1 cm. There was a posterior central upper breast focus with associated clip artifact measuring 1.2 x 2.2 x 1 cm previously biopsied to be benign. Suspicious findings in bilateral breasts.   4. Anterior left breast needle core biopsy and central right breast needle core biopsy on 04/03/2013. Left breast needle core biopsy showed invasive ductal carcinoma and ductal carcinoma in situ, 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% positive, progesterone receptor 23% positive, Ki-67 33%, and HER-2/neu by CISH no amplification. Right breast needle core biopsy showed ductal carcinoma in situ, 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% positive, progesterone receptor 11% positive.   5. Status post left breast modified radical mastectomy with left axillary lymph node resection and right breast lumpectomy with right axillary node biopsy on 04/22/2013 with pathology results as follows:  Left breast: Stage IIIA, pT3, pN2a, invasive ductal carcinoma with multiple foci, grade 1, largest span was 5.5 cm and ductal carcinoma in situ low-grade, 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% positive, progesterone receptor 23% positive, HER-2/neu no amplification, Ki-67 33%, with 4/22 metastatic lymph nodes and 1 isolated tumor cells identified within left axillary lymph node resection. Extracapsular extension was present and surgical resection margins were negative for carcinoma.  Right breast: Stage 0, pTis, pN0, 3.0 cm ductal carcinoma in situ with calcifications, intermediate to high-grade, 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% positive, progesterone receptor 11% positive, with 0/4  metastatic right axillary lymph nodes.  Reexcision of right breast medial margin: Ductal carcinoma in situ, intermediate grade spanning 0.8 cm, ductal carcinoma in situ is focally less than 0.1 cm from the new medial  margin, lobular neoplasia (atypical lobular hyperplasia).   6. 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 status post adjuvant chemotherapy consisting of FEC (5-FU/epirubicin/Cytoxan) completed 05/24/2013 - 07/23/13.   8. Status post 2 cycles of single agent Taxol administered from 08/16/2013 through 08/23/2013. 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 Receiving single agent Abraxane on 08/30/13.    Current therapy:  Abraxane cycle 3 day 1  Interim History: Kathy Maxwell 58 y.o. 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  She is receiving this in the adjuvant setting.  She is not feeling well today at all.  She has a cold that has been going on for the past 2 days.  She denies fevers with this, but does have chills, clear nasal drainage, non productive cough, and shortness of breath.  She does feel dizzy due to this, and she did come close to falling.  She has not taken any over the counter medications for this.  She continues to be very hypertensive.  She tells me that she is taking her anti-hypertensives.  She hasn't been taking Dexamethasone.  She is taking Gabapentin TID, and the numbness, though still present is stable.  Her fingernails continue to separate from the nail bed.  She is soaking them in tea tree oil BID.    Medications:  Current Outpatient Prescriptions  Medication Sig Dispense Refill  . amLODipine (NORVASC) 10 MG tablet Take 10 mg by mouth daily.      . Azilsartan-Chlorthalidone 40-25 MG TABS Take 1 tablet by mouth daily.       [name] buPROPion (WELLBUTRIN SR) 150 MG 12 hr tablet Take 1 tablet (150 mg total) by mouth daily.  30 tablet  6  . gabapentin (NEURONTIN) 100 MG capsule Take 3 capsules (300 mg total) by mouth 3 (three) times daily.  870 capsule  6  . hydrALAZINE (APRESOLINE)  25 MG tablet Take 25 mg by mouth 2 (two) times daily.      [name] lidocaine-prilocaine (EMLA) cream Apply topically as needed. Apply to port-a-cath one - two hours before accessing.  30 g  3  . losartan (COZAAR) 100 MG tablet Take 1 tablet (100 mg total) by mouth daily.  30 tablet  6  . metoprolol succinate (TOPROL-XL) 25 MG 24 hr tablet Take 25 mg by mouth daily.      . pantoprazole (PROTONIX) 40 MG tablet Take 1 tablet (40 mg total) by mouth daily.  30 tablet  3  . pioglitazone-metformin (ACTOPLUS MET) 15-850 MG per tablet Take 1 tablet by mouth 2 (two) times daily with a meal.      . rosuvastatin (CRESTOR) 5 MG tablet Take 5 mg by mouth daily.      . sitaGLIPtin (JANUVIA) 100 MG tablet Take 100 mg by mouth daily.      [name] dexamethasone (DECADRON) 4 MG tablet Take 1 tablet (4 mg total) by mouth as directed. Take 2 tablets once daily for three days starting the day after chemotherapy  60 tablet  0  . fluticasone (FLONASE) 50 MCG/ACT nasal spray Place 1  spray into both nostrils daily.  9.9 g  0  . HYDROcodone-acetaminophen (NORCO/VICODIN) 5-325 MG per tablet Take 1-2 tablets by mouth every 4 (four) hours as needed.  30 tablet  0  . levofloxacin (LEVAQUIN) 500 MG tablet Take 1 tablet (500 mg total) by mouth daily.  10 tablet  0  . LORazepam (ATIVAN) 0.5 MG tablet Take 1 tablet (0.$RemoveBef'5mg'ieerntzwGa$ ) every 6 hours as needed for nausea or vomiting.  30 tablet  2  . ondansetron (ZOFRAN) 8 MG tablet Take 1 tablet ($RemoveB'8mg'vcpIUQfZ$ s) every 12 hours as needed for nausea or vomiting starting the 3rd day after chemotherapy.  30 tablet  1  . PRESCRIPTION MEDICATION palonosetron (ALOXI) injection 0.25 mg 0.25 mg  Once 08/02/2013 08/02/2013      Route: Intravenous      . PRESCRIPTION MEDICATION epirubicin (ELLENCE) chemo injection 200 mg 100 mg/m2  2 m2 (Order-Specific)  Once 08/02/2013      . PRESCRIPTION MEDICATION fluorouracil (ADRUCIL) chemo injection 1,000 mg 500 mg/m2  2.02 m2 (Treatment Plan Actual)  Once 08/02/2013      . PRESCRIPTION  MEDICATION cyclophosphamide (CYTOXAN) 1,000 mg in sodium chloride 0.9 % 250 mL chemo infusion 500 mg/m2  2 m2 (Order-Specific)  Once 08/02/2013       No current facility-administered medications for this visit.   Facility-Administered Medications Ordered in Other Visits  Medication Dose Route Frequency Provider Last Rate Last Dose  . sodium chloride 0.9 % injection 10 mL  10 mL Intravenous PRN [name], MD   10 mL at 05/24/13 1522     Allergies:  Allergies  Allergen Reactions  . Adhesive [Tape] Rash    Clear tape gloves ,elastic no problem    Medical History: Past Medical History  Diagnosis Date  . Hyperlipidemia   . Hypertension   . Bipolar 1 disorder   . Cancer   . Hypertension 04/04/2013  . Hyperlipidemia 04/04/2013  . Bipolar 1 disorder 04/04/2013  . Breast cancer 03/10/13    left breast invasive ca  . Allergy     latex  . Obesity   . Diabetes mellitus without complication     NIDDM  . Sleep apnea     no cpap due to insurance  . Heart murmur   . GERD (gastroesophageal reflux disease)   . Coronary artery disease     Surgical History:  Past Surgical History  Procedure Laterality Date  . Cholecystectomy    . Breast biopsy Left 05/08/09    fibroadenoma with microcalcifications,no malignancy id  . Breast biopsy Left 03/07/13    invasive ca,1 lymph node metastatic  . 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 Abdominal hysterectomy    . Coronary stent placement  2001  . Cardiac catheterization    . Eye surgery  6/13,9/14    laser,cataract lft  . Coronary angioplasty      LAD stent [id number]  . Portacath placement Right 04/22/2013    Procedure: INSERTION PORT-A-CATH;  Surgeon: Adin Hector, MD;  Location: [facility];  Service: General;  Laterality: Right;  . Mastectomy modified radical Left 04/22/2013    Procedure: MASTECTOMY MODIFIED RADICAL;  Surgeon: Adin Hector, MD;  Location: [facility];  Service: General;   Laterality: Left;  . Breast lumpectomy with needle localization and axillary sentinel lymph node bx Right 04/22/2013    Procedure: BREAST LUMPECTOMY WITH NEEDLE LOCALIZATION AND AXILLARY SENTINEL LYMPH NODE BX;  Surgeon: Adin Hector, MD;  Location: [facility]  OR;  Service: General;  Laterality: Right;  . Esophagogastroduodenoscopy N/A 08/07/2013    Procedure: ESOPHAGOGASTRODUODENOSCOPY (EGD);  Surgeon: Barrie Folk, MD;  Location: [facility];  Service: Endoscopy;  Laterality: N/A;     Review of Systems: A 10 point review of systems was conducted and is otherwise negative except for what is noted above.     Physical Exam: Blood pressure 191/81, pulse 99, temperature 98.4 F (36.9 C), temperature source Oral, resp. rate 18, height 5\' 2"  (1.575 m), weight 203 lb 1.6 oz (92.126 kg). Oxygen saturation is 97% on room air GENERAL: Patient is a well appearing female wearing a mask, appears to obviously feel unwell HEENT:  Sclerae anicteric.  Oropharynx clear and moist. No ulcerations or evidence of oropharyngeal candidiasis. Neck is supple. Difficult to examine tympanic membranes bilaterally due to a significant amount of cerumen within bilateral ear canals  NODES:  No cervical, supraclavicular, or axillary lymphadenopathy palpated.  BREAST EXAM:  Deferred. LUNGS:  Crackles in right lower lobe.  No wheezes or rhonchi. HEART:  Regular rate and rhythm. No murmur appreciated. ABDOMEN:  Soft, nontender.  Positive, normoactive bowel sounds. No organomegaly palpated. MSK:  No focal spinal tenderness to palpation. Full range of motion bilaterally in the upper extremities. EXTREMITIES:  No peripheral edema.   SKIN:  Clear with no obvious rashes or skin changes. Nails are brittle and separating from the nail bed, the patient has lost several fingernails.  There is no sign of paronychia.  NEURO:  Nonfocal. Well oriented.  Appropriate affect. ECOG PERFORMANCE STATUS: 1 - Symptomatic but completely  ambulatory   Lab Results: Lab Results  Component Value Date   WBC 5.0 11/01/2013   HGB 10.7* 11/01/2013   HCT 32.8* 11/01/2013   MCV 91.6 11/01/2013   PLT 279 11/01/2013     Chemistry      Component Value Date/Time   NA 139 11/01/2013 0842   NA 136* 08/09/2013 0437   K 3.5 11/01/2013 0842   K 3.8 08/09/2013 0437   CL 100 08/09/2013 0437   CO2 27 11/01/2013 0842   CO2 25 08/09/2013 0437   BUN 10.5 11/01/2013 0842   BUN 8 08/09/2013 0437   CREATININE 0.8 11/01/2013 0842   CREATININE 0.69 08/09/2013 0437      Component Value Date/Time   CALCIUM 9.5 11/01/2013 0842   CALCIUM 8.4 08/09/2013 0437   ALKPHOS 95 11/01/2013 0842   ALKPHOS 103 08/09/2013 0437   AST 17 11/01/2013 0842   AST 8 08/09/2013 0437   ALT 13 11/01/2013 0842   ALT 5 08/09/2013 0437   BILITOT 0.78 11/01/2013 0842   BILITOT 0.2* 08/09/2013 0437      Assessment and Plan: 08/11/2013 58 y.o. female with  1. 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  She has underwent a left mastectomy and right lumpectomy.  She is currently receiving adjuvant chemotherapy.  She completed 6 cycles of FEC, received 2 cycles of single agent Taxol that was discontinued due to neuropathy, and has since started single agent Abraxane on 08/30/13.  She receives this treatment on day 1, day 8, and day 15, on a 28 day cycle.  She is tolerating this treatment well. Her CBC is stable.  I reviewed this with her in detail, however due to her acute infection, we will hold chemotherapy this week.    2. Neuropathy:  This is stable.  I recommended she continue with Gabapentin TID.    3. Hypertension: This continues to be uncontrolled.  She will f/u with her PCP this week regarding this.  I have discontinued all of her Dexamethasone.    4. Nail Dyscrasia:  Her fingernails appear stable from her last visit on exam.  She will continue soaking her nails in tea tree oil BID.     5. Upper respiratory infection. Due to the abnormality on physical exam in the patient's lungs, I ordered a chest x ray.  I prescribed Levaquin for the patient to take daily.  Her oxygen saturation is 97% on room air.  I instructed her in detail that if she developed any worsening shortness of breath, chest pain, or increasing weakness to proceed to the emergency room.  Detailed information on Levaquin was given to her in her AVS as well.    6. Dizziness.  The patient has been dizzy with her respiratory tract infection.  I examined her ears to evaluate for fluid or infection.  She denies ear pain, but the exam was limited due to a significant amount of cerumen within the ear canal.  I recommended she have her ears cleaned out at her PCP appointment if possible.  I prescribed Flonase BID.  The patient will return in one week for labs and evaluation for cycle 3 day 1 of Abraxane therapy.   She knows to call us in the interim for any questions or concerns.  We can certainly see her sooner if needed.  I spent 25 minutes counseling the patient face to face.  The total time spent in the appointment was 30 minutes.  Minette Headland, [place] [phone] 11/03/2013 8:02 AM

## 2013-11-01 NOTE — Patient Instructions (Addendum)
Due to your upper respiratory infection, you will not receive treatment today.  Should you develop any worsening shortness of breath, chest pain, or weakness, please go to the Emergency Room.  See if you can get your ears cleaned out and the wax removed at your primary care doctor's office.    Fluticasone nasal solution What is this medicine? FLUTICASONE (floo TIK a sone) is a corticosteroid. It helps decrease inflammation in your nose. This medicine is used to treat the symptoms of allergies like sneezing, itching, and runny or stuffy nose. This medicine may be used for other purposes; ask your health care provider or pharmacist if you have questions. COMMON BRAND NAME(S): Flonase What should I tell my health care provider before I take this medicine? They need to know if you have any of these conditions: -infection, like tuberculosis, herpes, or fungal infection -recent surgery on nose or sinuses -taking corticosteroid by mouth -an unusual or allergic reaction to fluticasone, steroids, other medicines, foods, dyes, or preservatives -pregnant or trying to get pregnant -breast-feeding How should I use this medicine? This medicine is for use in the nose. Follow the directions on your prescription label. This medicine works best if used regularly. Do not use more often than directed. Make sure that you are using your nasal spray correctly. Ask you doctor or health care provider if you have any questions. Talk to your pediatrician regarding the use of this medicine in children. While this drug may be prescribed for children as young as 37 years old for selected conditions, precautions do apply. Overdosage: If you think you have taken too much of this medicine contact a poison control center or emergency room at once. NOTE: This medicine is only for you. Do not share this medicine with others. What if I miss a dose? If you miss a dose, use it as soon as you remember. If it is almost time for your  next dose, use only that dose and continue with your regular schedule. Do not use double or extra doses. What may interact with this medicine? -ketoconazole -metyrapone -some medicines for HIV -vaccines This list may not describe all possible interactions. Give your health care provider a list of all the medicines, herbs, non-prescription drugs, or dietary supplements you use. Also tell them if you smoke, drink alcohol, or use illegal drugs. Some items may interact with your medicine. What should I watch for while using this medicine? Visit your doctor or health care professional for regular checks on your progress. Some symptoms may improve within 12 hours after starting use. Check with your doctor or health care professional if there is no improvement in your condition after 3 weeks of use. Do not come in contact with people who have chickenpox or the measles while you are taking this medicine. If you do, call your doctor right away. What side effects may I notice from receiving this medicine? Side effects that you should report to your doctor or health care professional as soon as possible: -allergic reactions like skin rash, itching or hives, swelling of the face, lips, or tongue -changes in vision -flu-like symptoms -white patches or sores in the mouth or nose Side effects that usually do not require medical attention (report to your doctor or health care professional if they continue or are bothersome): -burning or irritation inside the nose or throat -cough -headache -nosebleed -unusual taste or smell This list may not describe all possible side effects. Call your doctor for medical advice about side effects.  You may report side effects to FDA at 1-800-FDA-1088. Where should I keep my medicine? Keep out of the reach of children. Store at room temperature between 15 and 30 degrees C (59 and 86 degrees F). Throw away any unused medicine after the expiration date. NOTE: This sheet is a  summary. It may not cover all possible information. If you have questions about this medicine, talk to your doctor, pharmacist, or health care provider.  2014, [employer] Standard. (2008-06-17 10:40:16) Levofloxacin tablets What is this medicine? LEVOFLOXACIN (lee voe FLOX a sin) is a quinolone antibiotic. It is used to treat certain kinds of bacterial infections. It will not work for colds, flu, or other viral infections. This medicine may be used for other purposes; ask your health care provider or pharmacist if you have questions. COMMON BRAND NAME(S): Levaquin Leva-Pak, Levaquin What should I tell my health care provider before I take this medicine? They need to know if you have any of these conditions: -cerebral disease -irregular heartbeat -kidney disease -seizure disorder -an unusual or allergic reaction to levofloxacin, other antibiotics or medicines, foods, dyes, or preservatives -pregnant or trying to get pregnant -breast-feeding How should I use this medicine? Take this medicine by mouth with a full glass of water. Follow the directions on the prescription label. This medicine can be taken with or without food. Take your medicine at regular intervals. Do not take your medicine more often than directed. Do not skip doses or stop your medicine early even if you feel better. Do not stop taking except on your doctor's advice. A special MedGuide will be given to you by the pharmacist with each prescription and refill. Be sure to read this information carefully each time. Talk to your pediatrician regarding the use of this medicine in children. While this drug may be prescribed for children as young as 6 months for selected conditions, precautions do apply. Overdosage: If you think you have taken too much of this medicine contact a poison control center or emergency room at once. NOTE: This medicine is only for you. Do not share this medicine with others. What if I miss a dose? If you  miss a dose, take it as soon as you remember. If it is almost time for your next dose, take only that dose. Do not take double or extra doses. What may interact with this medicine? Do not take this medicine with any of the following medications: - arsenic trioxide - chloroquine - droperidol - medicines for irregular heart rhythm like amiodarone, disopyramide, dofetilide, flecainide, quinidine, procainamide, sotalol - some medicines for depression or mental problems like phenothiazines, pimozide, and ziprasidone This medicine may also interact with the following medications: - amoxapine -antacids - cisapride - dairy products - didanosine (ddI) buffered tablets or powder - haloperidol - multivitamins -NSAIDS, medicines for pain and inflammation, like ibuprofen or naproxen - retinoid products like tretinoin or isotretinoin - risperidone - some other antibiotics like clarithromycin or erythromycin - sucralfate - theophylline - warfarin This list may not describe all possible interactions. Give your health care provider a list of all the medicines, herbs, non-prescription drugs, or dietary supplements you use. Also tell them if you smoke, drink alcohol, or use illegal drugs. Some items may interact with your medicine. What should I watch for while using this medicine? Tell your doctor or health care professional if your symptoms do not improve or if they get worse. Drink several glasses of water a day and cut down on drinks that contain caffeine.  You must not get dehydrated while taking this medicine. You may get drowsy or dizzy. Do not drive, use machinery, or do anything that needs mental alertness until you know how this medicine affects you. Do not sit or stand up quickly, especially if you are an older patient. This reduces the risk of dizzy or fainting spells. This medicine can make you more sensitive to the sun. Keep out of the sun. If you cannot avoid being in the sun,  wear protective clothing and use a sunscreen. Do not use sun lamps or tanning beds/booths. Contact your doctor if you get a sunburn. If you are a diabetic monitor your blood glucose carefully. If you get an unusual reading stop taking this medicine and call your doctor right away. Do not treat diarrhea with over-the-counter products. Contact your doctor if you have diarrhea that lasts more than 2 days or if the diarrhea is severe and watery. Avoid antacids, calcium, iron, and zinc products for 2 hours before and 2 hours after taking a dose of this medicine. What side effects may I notice from receiving this medicine? Side effects that you should report to your doctor or health care professional as soon as possible: -allergic reactions like skin rash or hives, swelling of the face, lips, or tongue -changes in vision -confusion, nightmares or hallucinations -difficulty breathing -irregular heartbeat, chest pain -joint, muscle or tendon pain -pain or difficulty passing urine -persistent headache with or without blurred vision -redness, blistering, peeling or loosening of the skin, including inside the mouth -seizures -unusual pain, numbness, tingling, or weakness -vaginal irritation, discharge  Side effects that usually do not require medical attention (report to your doctor or health care professional if they continue or are bothersome): -diarrhea -dry mouth -headache -stomach upset, nausea -trouble sleeping This list may not describe all possible side effects. Call your doctor for medical advice about side effects. You may report side effects to FDA at 1-800-FDA-1088. Where should I keep my medicine? Keep out of the reach of children. Store at room temperature between 15 and 30 degrees C (59 and 86 degrees F). Keep in a tightly closed container. Throw away any unused medicine after the expiration date. NOTE: This sheet is a summary. It may not cover all possible information. If you have  questions about this medicine, talk to your doctor, pharmacist, or health care provider.  2014, [employer] Standard. (2012-03-01 16:27:55)

## 2013-11-04 ENCOUNTER — Ambulatory Visit: Payer: Medicare HMO | Admitting: Nutrition

## 2013-11-04 ENCOUNTER — Ambulatory Visit (HOSPITAL_COMMUNITY)
Admission: RE | Admit: 2013-11-04 | Discharge: 2013-11-04 | Disposition: A | Discharge status: Home / Self Care | Payer: Medicare HMO | Source: Ambulatory Visit | Attending: Adult Health | Admitting: Adult Health

## 2013-11-04 DIAGNOSIS — R059 Cough, unspecified: Secondary | ICD-10-CM | POA: Insufficient documentation

## 2013-11-04 DIAGNOSIS — R0989 Other specified symptoms and signs involving the circulatory and respiratory systems: Secondary | ICD-10-CM | POA: Insufficient documentation

## 2013-11-04 DIAGNOSIS — R05 Cough: Secondary | ICD-10-CM

## 2013-11-04 NOTE — Progress Notes (Signed)
58 year old female diagnosed with breast cancer.  She is a patient of Dr. Humphrey Rolls.  Past medical history includes hyperlipidemia, hypertension, bipolar disease, obesity, diabetes, sleep apnea, GERD, and CAD.  Medications include Wellbutrin, Decadron, Ativan, Zofran, Protonix, Crestor, and Januvia.  Labs include glucose of 391 on April 1.  Height: 62 inches. Weight: 203.1 pounds. Usual body weight: 210 pounds. BMI: 37.14.  Patient does not know why she has a nutrition appointment.  She denies nutrition side effects from her breast cancer treatment.  She does admit her blood pressure has been high.  Noted glucose levels 391.  Patient is not interested in making changes to her dietary pattern at this time.  Nutrition diagnosis: Not ready for diet/lifestyle change related to disinterest in learning/applying information as evidenced by negative body language.  Intervention: I did attempt to educate patient on the importance of increasing plant-based foods, especially high-fiber foods and whole fruits and vegetables.  I explained the importance of healthy diet to reduce breast cancer recurrence.  I encouraged patient to begin walking or other approved exercise for 10 minutes at a time.  This has been approved by her physician.  Provided basic tips on improving blood glucose levels.  Provided fact sheets for patient to take.  Teach back method used.  Contact information provided.  Monitoring, evaluation, goals: Patient will begin 10 minutes of exercise daily as approved by physician.  Next visit: No followup is scheduled.

## 2013-11-08 ENCOUNTER — Telehealth: Payer: Self-pay | Admitting: Adult Health

## 2013-11-08 ENCOUNTER — Ambulatory Visit (HOSPITAL_BASED_OUTPATIENT_CLINIC_OR_DEPARTMENT_OTHER): Payer: Medicare HMO

## 2013-11-08 ENCOUNTER — Encounter: Payer: Self-pay | Admitting: Adult Health

## 2013-11-08 ENCOUNTER — Ambulatory Visit (HOSPITAL_BASED_OUTPATIENT_CLINIC_OR_DEPARTMENT_OTHER): Payer: Medicare HMO | Admitting: Adult Health

## 2013-11-08 ENCOUNTER — Other Ambulatory Visit (HOSPITAL_BASED_OUTPATIENT_CLINIC_OR_DEPARTMENT_OTHER): Payer: Medicare HMO

## 2013-11-08 VITALS — BP 192/98 | HR 101 | Temp 98.2°F | Resp 18 | Ht 62.0 in | Wt 198.3 lb

## 2013-11-08 DIAGNOSIS — C50412 Malignant neoplasm of upper-outer quadrant of left female breast: Secondary | ICD-10-CM

## 2013-11-08 DIAGNOSIS — G5690 Unspecified mononeuropathy of unspecified upper limb: Secondary | ICD-10-CM

## 2013-11-08 DIAGNOSIS — Z170 Estrogen receptor positive status [ER+]: Secondary | ICD-10-CM

## 2013-11-08 DIAGNOSIS — Z5111 Encounter for antineoplastic chemotherapy: Secondary | ICD-10-CM

## 2013-11-08 DIAGNOSIS — C773 Secondary and unspecified malignant neoplasm of axilla and upper limb lymph nodes: Secondary | ICD-10-CM

## 2013-11-08 DIAGNOSIS — L609 Nail disorder, unspecified: Secondary | ICD-10-CM

## 2013-11-08 DIAGNOSIS — C50419 Malignant neoplasm of upper-outer quadrant of unspecified female breast: Secondary | ICD-10-CM

## 2013-11-08 DIAGNOSIS — E876 Hypokalemia: Secondary | ICD-10-CM

## 2013-11-08 DIAGNOSIS — I10 Essential (primary) hypertension: Secondary | ICD-10-CM

## 2013-11-08 DIAGNOSIS — G5790 Unspecified mononeuropathy of unspecified lower limb: Secondary | ICD-10-CM

## 2013-11-08 DIAGNOSIS — D0590 Unspecified type of carcinoma in situ of unspecified breast: Secondary | ICD-10-CM

## 2013-11-08 LAB — COMPREHENSIVE METABOLIC PANEL (CC13)
ALBUMIN: 3.4 g/dL — AB (ref 3.5–5.0)
ALT: 9 U/L (ref 0–55)
ANION GAP: 11 meq/L (ref 3–11)
AST: 14 U/L (ref 5–34)
Alkaline Phosphatase: 102 U/L (ref 40–150)
BUN: 6.5 mg/dL — ABNORMAL LOW (ref 7.0–26.0)
CO2: 24 mEq/L (ref 22–29)
Calcium: 9.6 mg/dL (ref 8.4–10.4)
Chloride: 104 mEq/L (ref 98–109)
Creatinine: 0.9 mg/dL (ref 0.6–1.1)
GLUCOSE: 298 mg/dL — AB (ref 70–140)
POTASSIUM: 3.2 meq/L — AB (ref 3.5–5.1)
Sodium: 138 mEq/L (ref 136–145)
Total Bilirubin: 0.53 mg/dL (ref 0.20–1.20)
Total Protein: 6.7 g/dL (ref 6.4–8.3)

## 2013-11-08 LAB — CBC WITH DIFFERENTIAL/PLATELET
BASO%: 0.6 % (ref 0.0–2.0)
Basophils Absolute: 0 10*3/uL (ref 0.0–0.1)
EOS ABS: 0.1 10*3/uL (ref 0.0–0.5)
EOS%: 1.4 % (ref 0.0–7.0)
HCT: 34.7 % — ABNORMAL LOW (ref 34.8–46.6)
HGB: 11.4 g/dL — ABNORMAL LOW (ref 11.6–15.9)
LYMPH#: 1.5 10*3/uL (ref 0.9–3.3)
LYMPH%: 20.8 % (ref 14.0–49.7)
MCH: 30.1 pg (ref 25.1–34.0)
MCHC: 32.9 g/dL (ref 31.5–36.0)
MCV: 91.6 fL (ref 79.5–101.0)
MONO#: 0.6 10*3/uL (ref 0.1–0.9)
MONO%: 7.9 % (ref 0.0–14.0)
NEUT%: 69.3 % (ref 38.4–76.8)
NEUTROS ABS: 4.9 10*3/uL (ref 1.5–6.5)
PLATELETS: 337 10*3/uL (ref 145–400)
RBC: 3.78 10*6/uL (ref 3.70–5.45)
RDW: 15.5 % — ABNORMAL HIGH (ref 11.2–14.5)
WBC: 7 10*3/uL (ref 3.9–10.3)

## 2013-11-08 MED ORDER — HEPARIN SOD (PORK) LOCK FLUSH 100 UNIT/ML IV SOLN
500.0000 [IU] | Freq: Once | INTRAVENOUS | Status: AC | PRN
  Administered 2013-11-08: 500 [IU]
  Filled 2013-11-08: qty 5

## 2013-11-08 MED ORDER — ONDANSETRON 8 MG/50ML IVPB (CHCC)
8.0000 mg | Freq: Once | INTRAVENOUS | Status: AC
  Administered 2013-11-08: 8 mg via INTRAVENOUS

## 2013-11-08 MED ORDER — FUROSEMIDE 40 MG PO TABS: 40.0000 mg | ORAL_TABLET | Freq: Every day | ORAL | Status: DC

## 2013-11-08 MED ORDER — SODIUM CHLORIDE 0.9 % IV SOLN
Freq: Once | INTRAVENOUS | Status: AC
  Administered 2013-11-08: 13:00:00 via INTRAVENOUS

## 2013-11-08 MED ORDER — SODIUM CHLORIDE 0.9 % IJ SOLN
10.0000 mL | INTRAMUSCULAR | Status: DC | PRN
  Administered 2013-11-08: 10 mL
  Filled 2013-11-08: qty 10

## 2013-11-08 MED ORDER — POTASSIUM CHLORIDE CRYS ER 20 MEQ PO TBCR: 20.0000 meq | EXTENDED_RELEASE_TABLET | Freq: Two times a day (BID) | ORAL | Status: DC

## 2013-11-08 MED ORDER — ONDANSETRON 8 MG/NS 50 ML IVPB
INTRAVENOUS | Status: AC
  Filled 2013-11-08: qty 8

## 2013-11-08 MED ORDER — PACLITAXEL PROTEIN-BOUND CHEMO INJECTION 100 MG
100.0000 mg/m2 | Freq: Once | INTRAVENOUS | Status: AC
Start: 2013-11-08 — End: 2013-11-08
  Administered 2013-11-08: 200 mg via INTRAVENOUS
  Filled 2013-11-08: qty 40

## 2013-11-08 NOTE — Progress Notes (Signed)
Hematology and Oncology Follow Up Visit  Kathy Maxwell [id number] [birth year] 58 y.o. 11/09/2013 7:02 PM     Principle Diagnosis:Gary D [name] 58 y.o. 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 Screening mammogram in 12/2012 was abnormal which led to left digital diagnostic mammogram and left breast ultrasound on 03/01/2013. Ultrasound of the left breast showed a hypoechoic mass at 12:30, 6 cm from the nipple, that measured 1.9 x 2.6 x 2.9 cm. Ultrasound of the left axilla demonstrates a 1.1 x 0.7 x 0.9 cm lymph node with a borderline thickened cortex.   2. Left breast needle core biopsy at the 12:30 o'clock position and left needle core biopsy of the left axillary lymph node on 03/07/2013 showed invasive mammary carcinoma with features compatible with a grade 1 or 2 ductal carcinoma in the breast needle core biopsy and lymph node was positive for metastatic mammary carcinoma with a macrometastasis and extracapsular extension identified. Estrogen receptor 100% positive, progesterone receptor 75% positive, Ki-67 15%, HER-2/neu by CISH no amplification.   3. Bilateral breast MRI on 03/15/2013 showed in the left breast there were at least 4 focus of abnormal spiculated enhancement in the upper left breast centrally. The entire area measured 7.4 cm x 6.6 cm x 5.1 cm. The previous biopsy focus was posteriorly located and measured 2.1 x 1 x 2.1 cm. The anterior focus that was not biopsied measured 3.1 x 2.6 x 2.6 cm. A lateral focus that was not biopsied measured 1.1 x 1.4 x 1.6 cm. There were small medial focus measuring cyst 0.7 x 0.8 x 0.7 cm Post biopsy changes were identified in the left axilla. There were abnormal enlarged lymph nodes correlating to the patient's known metastatic neoplasm to the lymph nodes which the largest lymph node measured 1.8 x 0.8 cm. In the right breast there was an irregular focus of 3.3 x  3.1 x 1.3 cm abnormal enhancement in the central right breast in the middle one third. There was a mild thickened cortex and prominent lymph nodes within the right axilla with the largest measuring 1.6 x 1 cm. There was a posterior central upper breast focus with associated clip artifact measuring 1.2 x 2.2 x 1 cm previously biopsied to be benign. Suspicious findings in bilateral breasts.   4. Anterior left breast needle core biopsy and central right breast needle core biopsy on 04/03/2013. Left breast needle core biopsy showed invasive ductal carcinoma and ductal carcinoma in situ, 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% positive, progesterone receptor 23% positive, Ki-67 33%, and HER-2/neu by CISH no amplification. Right breast needle core biopsy showed ductal carcinoma in situ, 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% positive, progesterone receptor 11% positive.   5. Status post left breast modified radical mastectomy with left axillary lymph node resection and right breast lumpectomy with right axillary node biopsy on 04/22/2013 with pathology results as follows:  Left breast: Stage IIIA, pT3, pN2a, invasive ductal carcinoma with multiple foci, grade 1, largest span was 5.5 cm and ductal carcinoma in situ low-grade, 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% positive, progesterone receptor 23% positive, HER-2/neu no amplification, Ki-67 33%, with 4/22 metastatic lymph nodes and 1 isolated tumor cells identified within left axillary lymph node resection. Extracapsular extension was present and surgical resection margins were negative for carcinoma.  Right breast: Stage 0, pTis, pN0, 3.0 cm ductal carcinoma in situ with calcifications, intermediate to high-grade, 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% positive, progesterone receptor 11% positive, with 0/4 metastatic right  axillary lymph nodes.  Reexcision of right breast medial margin: Ductal carcinoma in situ, intermediate grade spanning 0.8 cm, ductal carcinoma in situ is focally less than 0.1 cm from the new medial margin,  lobular neoplasia (atypical lobular hyperplasia).   6. 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 status post adjuvant chemotherapy consisting of FEC (5-FU/epirubicin/Cytoxan) completed 05/24/2013 - 07/23/13.   8. Status post 2 cycles of single agent Taxol administered from 08/16/2013 through 08/23/2013. 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 Receiving single agent Abraxane on 08/30/13.    Current therapy:  Abraxane cycle 3 day 1  Interim History: Kathy Maxwell 58 y.o. 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  She is receiving this in the adjuvant setting.    The patient is here today for evaluation prior to cycle 3 day 1 of treatment.  She is feeling much improved today as compared to last week.  Her upper respiratory infection has resolved.  Her neuropathy though remains, continues to improve.  She also has nail dyscrasia and has lost her thumb nail and finger nail.  She denies fevers, chills, nasuea, vomiting, constipation, diarrhea.  She continues to have significant hypertension, and states she is taking all of her anti-hypertensives.  She denies weakness, numbness, slurred speech, vision changes, or any further concerns.    Medications:  Current Outpatient Prescriptions  Medication Sig Dispense Refill  . amLODipine (NORVASC) 10 MG tablet Take 10 mg by mouth daily.      . Azilsartan-Chlorthalidone 40-25 MG TABS Take 1 tablet by mouth daily.       [name] buPROPion (WELLBUTRIN SR) 150 MG 12 hr tablet Take 1 tablet (150 mg total) by mouth daily.  30 tablet  6  . fluticasone (FLONASE) 50 MCG/ACT nasal spray Place 1 spray into both nostrils daily.  9.9 g  0  . gabapentin (NEURONTIN) 100 MG capsule Take 3 capsules (300 mg total) by mouth 3 (three) times daily.  870 capsule  6  . hydrALAZINE  (APRESOLINE) 25 MG tablet Take 25 mg by mouth 2 (two) times daily.      [name] levofloxacin (LEVAQUIN) 500 MG tablet Take 1 tablet (500 mg total) by mouth daily.  10 tablet  0  . lidocaine-prilocaine (EMLA) cream Apply topically as needed. Apply to port-a-cath one - two hours before accessing.  30 g  3  . LORazepam (ATIVAN) 0.5 MG tablet Take 1 tablet (0.17m) every 6 hours as needed for nausea or vomiting.  30 tablet  2  . losartan (COZAAR) 100 MG tablet Take 1 tablet (100 mg total) by mouth daily.  30 tablet  6  . metoprolol succinate (TOPROL-XL) 25 MG 24 hr tablet Take 25 mg by mouth daily.      . pantoprazole (PROTONIX) 40 MG tablet Take 1 tablet (40 mg total) by mouth daily.  30 tablet  3  . pioglitazone-metformin (ACTOPLUS MET) 15-850 MG per tablet Take 1 tablet by mouth every morning.       . rosuvastatin (CRESTOR) 5 MG tablet Take 5 mg by mouth daily.      . sitaGLIPtin (JANUVIA) 100 MG tablet Take 100 mg by mouth daily.      .Marland Kitchendexamethasone (DECADRON) 4 MG tablet Take 1 tablet (4 mg total) by mouth  as directed. Take 2 tablets once daily for three days starting the day after chemotherapy  60 tablet  0  . furosemide (LASIX) 40 MG tablet Take 1 tablet (40 mg total) by mouth daily.  30 tablet  0  . HYDROcodone-acetaminophen (NORCO/VICODIN) 5-325 MG per tablet Take 1-2 tablets by mouth every 4 (four) hours as needed.  30 tablet  0  . ondansetron (ZOFRAN) 8 MG tablet Take 1 tablet (18ms) every 12 hours as needed for nausea or vomiting starting the 3rd day after chemotherapy.  30 tablet  1  . potassium chloride SA (K-DUR,KLOR-CON) 20 MEQ tablet Take 1 tablet (20 mEq total) by mouth 2 (two) times daily.  60 tablet  0  . PRESCRIPTION MEDICATION palonosetron (ALOXI) injection 0.25 mg 0.25 mg  Once 08/02/2013 08/02/2013      Route: Intravenous      . PRESCRIPTION MEDICATION epirubicin (ELLENCE) chemo injection 200 mg 100 mg/m2  2 m2 (Order-Specific)  Once 08/02/2013      . PRESCRIPTION MEDICATION  fluorouracil (ADRUCIL) chemo injection 1,000 mg 500 mg/m2  2.02 m2 (Treatment Plan Actual)  Once 08/02/2013      . PRESCRIPTION MEDICATION cyclophosphamide (CYTOXAN) 1,000 mg in sodium chloride 0.9 % 250 mL chemo infusion 500 mg/m2  2 m2 (Order-Specific)  Once 08/02/2013       No current facility-administered medications for this visit.   Facility-Administered Medications Ordered in Other Visits  Medication Dose Route Frequency Provider Last Rate Last Dose  . sodium chloride 0.9 % injection 10 mL  10 mL Intravenous PRN KDeatra Robinson MD   10 mL at 05/24/13 1522     Allergies:  Allergies  Allergen Reactions  . Adhesive [Tape] Rash    Clear tape gloves ,elastic no problem    Medical History: Past Medical History  Diagnosis Date  . Hyperlipidemia   . Hypertension   . Bipolar 1 disorder   . Cancer   . Hypertension 04/04/2013  . Hyperlipidemia 04/04/2013  . Bipolar 1 disorder 04/04/2013  . Breast cancer 03/10/13    left breast invasive ca  . Allergy     latex  . Obesity   . Diabetes mellitus without complication     NIDDM  . Sleep apnea     no cpap due to insurance  . Heart murmur   . GERD (gastroesophageal reflux disease)   . Coronary artery disease     Surgical History:  Past Surgical History  Procedure Laterality Date  . Cholecystectomy    . Breast biopsy Left 05/08/09    fibroadenoma with microcalcifications,no malignancy id  . Breast biopsy Left 03/07/13    invasive ca,1 lymph node metastatic  . 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 Abdominal hysterectomy    . Coronary stent placement  2001  . Cardiac catheterization    . Eye surgery  6/13,9/14    laser,cataract lft  . Coronary angioplasty      LAD stent 2003  . Portacath placement Right 04/22/2013    Procedure: INSERTION PORT-A-CATH;  Surgeon: HAdin Hector MD;  [facility]  Service: General;  Laterality: Right;  . Mastectomy modified radical Left  04/22/2013    Procedure: MASTECTOMY MODIFIED RADICAL;  Surgeon: HAdin Hector MD;  [facility]  Service: General;  Laterality: Left;  . Breast lumpectomy with needle localization and axillary sentinel lymph node bx Right 04/22/2013    Procedure: BREAST LUMPECTOMY WITH NEEDLE LOCALIZATION AND AXILLARY SENTINEL LYMPH  NODE BX;  Surgeon: Adin Hector, MD;  Location: [facility];  Service: General;  Laterality: Right;  . Esophagogastroduodenoscopy N/A 08/07/2013    Procedure: ESOPHAGOGASTRODUODENOSCOPY (EGD);  Surgeon: Missy Sabins, MD;  Location: [facility];  Service: Endoscopy;  Laterality: N/A;     Review of Systems: A 10 point review of systems was conducted and is otherwise negative except for what is noted above.     Physical Exam: Blood pressure 192/98, pulse 101, temperature 98.2 F (36.8 C), temperature source Oral, resp. rate 18, height 5' 2" (1.575 m), weight 198 lb 4.8 oz (89.948 kg). GENERAL: Patient is a well appearing female wearing a mask, appears to obviously feel unwell HEENT:  Sclerae anicteric.  Oropharynx clear and moist. No ulcerations or evidence of oropharyngeal candidiasis. Neck is supple. PERRLA. NODES:  No cervical, supraclavicular, or axillary lymphadenopathy palpated.  BREAST EXAM:  Deferred. LUNGS:  Clear throughout  HEART:  Regular rate and rhythm. No murmur appreciated. ABDOMEN:  Soft, nontender.  Positive, normoactive bowel sounds. No organomegaly palpated. MSK:  No focal spinal tenderness to palpation. Full range of motion bilaterally in the upper extremities. EXTREMITIES:  No peripheral edema.   SKIN:  Clear with no obvious rashes or skin changes. Nails are brittle and separating from the nail bed, the patient has lost several fingernails.  There is no sign of paronychia.  NEURO:  Nonfocal. Well oriented.  Appropriate affect. CN II-XII intact, strength 5/5 x 4 extremities, face is symmetric ECOG PERFORMANCE STATUS: 1 - Symptomatic but completely  ambulatory   Lab Results: Lab Results  Component Value Date   WBC 7.0 11/08/2013   HGB 11.4* 11/08/2013   HCT 34.7* 11/08/2013   MCV 91.6 11/08/2013   PLT 337 11/08/2013     Chemistry      Component Value Date/Time   NA 138 11/08/2013 1023   NA 136* 08/09/2013 0437   K 3.2* 11/08/2013 1023   K 3.8 08/09/2013 0437   CL 100 08/09/2013 0437   CO2 24 11/08/2013 1023   CO2 25 08/09/2013 0437   BUN 6.5* 11/08/2013 1023   BUN 8 08/09/2013 0437   CREATININE 0.9 11/08/2013 1023   CREATININE 0.69 08/09/2013 0437      Component Value Date/Time   CALCIUM 9.6 11/08/2013 1023   CALCIUM 8.4 08/09/2013 0437   ALKPHOS 102 11/08/2013 1023   ALKPHOS 103 08/09/2013 0437   AST 14 11/08/2013 1023   AST 8 08/09/2013 0437   ALT 9 11/08/2013 1023   ALT 5 08/09/2013 0437   BILITOT 0.53 11/08/2013 1023   BILITOT 0.2* 08/09/2013 0437      Assessment and Plan: Kathy Maxwell 58 y.o. female with  1. 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  She has underwent a left mastectomy and right lumpectomy.  She is currently receiving adjuvant chemotherapy.  She completed 6 cycles of FEC, received 2 cycles of single agent Taxol that was discontinued due to neuropathy, and has since started single agent Abraxane on 08/30/13.  She receives this treatment on day 1, day 8, and day 15, on a 28 day cycle.  She is tolerating this treatment well. Due to her hypertension, she does not receive any Dexamethasone with her treatment both IV or PO. She will proceed with cycle 3 day 1 today.   2. Neuropathy:  This is stable.  I recommended she continue with Gabapentin TID.  3. Hypertension: This continues to be uncontrolled.  She is taking her anti-hypertensive medications.  I have recommended she f/u with her PCP about this and call their office.  My nurse, Rea College, RN did confirm that she has an appointment with Dr. Baird Cancer on Tuesday, 11/12/13.  In  the interim, I reviewed her case with dr. Jana Hakim.  Her chlorithalidone was discontinued, and she was prescribed Lasix 25mq po daily with Kdur 280m po BID.  Natro faxed the patient's BP readings to Dr. SaLynder Parentsffice.  I again informed the patient of her increased risk of stroke with blood pressures this high.    4. Nail Dyscrasia:  Her fingernails appear stable from her last visit on exam.  She will continue soaking her nails in tea tree oil BID.    5. Upper respiratory infection.  This has resolved.    The patient will return in one week for labs and evaluation for cycle 3 day 8 of Abraxane therapy.   She knows to call [facility] the interim for any questions or concerns.  We can certainly see her sooner if needed.  I spent 25 minutes counseling the patient face to face.  The total time spent in the appointment was 30 minutes.  LiMinette [id number]/25/2015 7:02 PM

## 2013-11-08 NOTE — Patient Instructions (Signed)
Stop the Chlorithalidone.  Take Lasix daily.  I will prescribe potassium as well.  Furosemide tablets What is this medicine? FUROSEMIDE (fyoor [place] se mide) is a diuretic. It helps you make more urine and to lose salt and excess water from your body. This medicine is used to treat high blood pressure, and edema or swelling from heart, kidney, or liver disease. This medicine may be used for other purposes; ask your health care provider or pharmacist if you have questions. COMMON BRAND NAME(S): Delone , Lasix What should I tell my health care provider before I take this medicine? They need to know if you have any of these conditions: -abnormal blood electrolytes -diarrhea or vomiting -gout -heart disease -kidney disease, small amounts of urine, or difficulty passing urine -liver disease -an unusual or allergic reaction to furosemide, sulfa drugs, other medicines, foods, dyes, or preservatives -pregnant or trying to get pregnant -breast-feeding How should I use this medicine? Take this medicine by mouth with a glass of water. Follow the directions on the prescription label. You may take this medicine with or without food. If it upsets your stomach, take it with food or milk. Do not take your medicine more often than directed. Remember that you will need to pass more urine after taking this medicine. Do not take your medicine at a time of day that will cause you problems. Do not take at bedtime. Talk to your pediatrician regarding the use of this medicine in children. While this drug may be prescribed for selected conditions, precautions do apply. Overdosage: If you think you have taken too much of this medicine contact a poison control center or emergency room at once. NOTE: This medicine is only for you. Do not share this medicine with others. What if I miss a dose? If you miss a dose, take it as soon as you can. If it is almost time for your next dose, take only that dose. Do not take double or  extra doses. What may interact with this medicine? -aspirin and aspirin-like medicines -certain antibiotics -chloral hydrate -cisplatin -cyclosporine -digoxin -diuretics -laxatives -lithium -medicines for blood pressure -medicines that relax muscles for surgery -methotrexate -NSAIDs, medicines for pain and inflammation like ibuprofen, naproxen, or indomethacin -phenytoin -steroid medicines like prednisone or cortisone -sucralfate This list may not describe all possible interactions. Give your health care provider a list of all the medicines, herbs, non-prescription drugs, or dietary supplements you use. Also tell them if you smoke, drink alcohol, or use illegal drugs. Some items may interact with your medicine. What should I watch for while using this medicine? Visit your doctor or health care professional for regular checks on your progress. Check your blood pressure regularly. Ask your doctor or health care professional what your blood pressure should be, and when you should contact him or her. If you are a diabetic, check your blood sugar as directed. You may need to be on a special diet while taking this medicine. Check with your doctor. Also, ask how many glasses of fluid you need to drink a day. You must not get dehydrated. You may get drowsy or dizzy. Do not drive, use machinery, or do anything that needs mental alertness until you know how this drug affects you. Do not stand or sit up quickly, especially if you are an older patient. This reduces the risk of dizzy or fainting spells. Alcohol can make you more drowsy and dizzy. Avoid alcoholic drinks. This medicine can make you more sensitive to the sun.  Keep out of the sun. If you cannot avoid being in the sun, wear protective clothing and use sunscreen. Do not use sun lamps or tanning beds/booths. What side effects may I notice from receiving this medicine? Side effects that you should report to your doctor or health care  professional as soon as possible: -blood in urine or stools -dry mouth -fever or chills -hearing loss or ringing in the ears -irregular heartbeat -muscle pain or weakness, cramps -skin rash -stomach upset, pain, or nausea -tingling or numbness in the hands or feet -unusually weak or tired -vomiting or diarrhea -yellowing of the eyes or skin Side effects that usually do not require medical attention (report to your doctor or health care professional if they continue or are bothersome): -headache -loss of appetite -unusual bleeding or bruising This list may not describe all possible side effects. Call your doctor for medical advice about side effects. You may report side effects to FDA at 1-800-FDA-1088. Where should I keep my medicine? Keep out of the reach of children. Store at room temperature between 15 and 30 degrees C (59 and 86 degrees F). Protect from light. Throw away any unused medicine after the expiration date. NOTE: This sheet is a summary. It may not cover all possible information. If you have questions about this medicine, talk to your doctor, pharmacist, or health care provider.  2014, [employer] Standard. (2009-06-22 16:24:50)

## 2013-11-08 NOTE — Patient Instructions (Signed)
[facility] Discharge Instructions for Patients Receiving Chemotherapy  Today you received the following chemotherapy agent; Abraxane.  To help prevent nausea and vomiting after your treatment, we encourage you to take your nausea medication.   If you develop nausea and vomiting that is not controlled by your nausea medication, call the clinic.   BELOW ARE SYMPTOMS THAT SHOULD BE REPORTED IMMEDIATELY:  *FEVER GREATER THAN 100.5 F  *CHILLS WITH OR WITHOUT FEVER  NAUSEA AND VOMITING THAT IS NOT CONTROLLED WITH YOUR NAUSEA MEDICATION  *UNUSUAL SHORTNESS OF BREATH  *UNUSUAL BRUISING OR BLEEDING  TENDERNESS IN MOUTH AND THROAT WITH OR WITHOUT PRESENCE OF ULCERS  *URINARY PROBLEMS  *BOWEL PROBLEMS  UNUSUAL RASH Items with * indicate a potential emergency and should be followed up as soon as possible.  Feel free to call the clinic you have any questions or concerns. The clinic phone number is [phone].    

## 2013-11-08 NOTE — Telephone Encounter (Signed)
, °

## 2013-11-11 ENCOUNTER — Telehealth: Payer: Self-pay | Admitting: *Deleted

## 2013-11-11 ENCOUNTER — Observation Stay (HOSPITAL_COMMUNITY)
Admission: EM | Admit: 2013-11-11 | Discharge: 2013-11-13 | Disposition: A | Discharge status: Home / Self Care | Payer: Medicare HMO | Attending: Internal Medicine | Admitting: Internal Medicine

## 2013-11-11 ENCOUNTER — Encounter (HOSPITAL_COMMUNITY): Payer: Self-pay | Admitting: Emergency Medicine

## 2013-11-11 ENCOUNTER — Emergency Department (HOSPITAL_COMMUNITY): Payer: Medicare HMO

## 2013-11-11 ENCOUNTER — Other Ambulatory Visit: Payer: Self-pay

## 2013-11-11 DIAGNOSIS — C50419 Malignant neoplasm of upper-outer quadrant of unspecified female breast: Secondary | ICD-10-CM

## 2013-11-11 DIAGNOSIS — G4730 Sleep apnea, unspecified: Secondary | ICD-10-CM | POA: Insufficient documentation

## 2013-11-11 DIAGNOSIS — Z87891 Personal history of nicotine dependence: Secondary | ICD-10-CM | POA: Insufficient documentation

## 2013-11-11 DIAGNOSIS — I5032 Chronic diastolic (congestive) heart failure: Secondary | ICD-10-CM

## 2013-11-11 DIAGNOSIS — K219 Gastro-esophageal reflux disease without esophagitis: Secondary | ICD-10-CM | POA: Insufficient documentation

## 2013-11-11 DIAGNOSIS — Z9889 Other specified postprocedural states: Secondary | ICD-10-CM | POA: Insufficient documentation

## 2013-11-11 DIAGNOSIS — R072 Precordial pain: Principal | ICD-10-CM | POA: Insufficient documentation

## 2013-11-11 DIAGNOSIS — C50412 Malignant neoplasm of upper-outer quadrant of left female breast: Secondary | ICD-10-CM

## 2013-11-11 DIAGNOSIS — I2510 Atherosclerotic heart disease of native coronary artery without angina pectoris: Secondary | ICD-10-CM | POA: Insufficient documentation

## 2013-11-11 DIAGNOSIS — R079 Chest pain, unspecified: Secondary | ICD-10-CM

## 2013-11-11 DIAGNOSIS — Z9861 Coronary angioplasty status: Secondary | ICD-10-CM | POA: Insufficient documentation

## 2013-11-11 DIAGNOSIS — E785 Hyperlipidemia, unspecified: Secondary | ICD-10-CM

## 2013-11-11 DIAGNOSIS — E669 Obesity, unspecified: Secondary | ICD-10-CM | POA: Insufficient documentation

## 2013-11-11 DIAGNOSIS — F319 Bipolar disorder, unspecified: Secondary | ICD-10-CM

## 2013-11-11 DIAGNOSIS — Z853 Personal history of malignant neoplasm of breast: Secondary | ICD-10-CM | POA: Insufficient documentation

## 2013-11-11 DIAGNOSIS — Z881 Allergy status to other antibiotic agents status: Secondary | ICD-10-CM | POA: Insufficient documentation

## 2013-11-11 DIAGNOSIS — R42 Dizziness and giddiness: Secondary | ICD-10-CM | POA: Insufficient documentation

## 2013-11-11 DIAGNOSIS — Z79899 Other long term (current) drug therapy: Secondary | ICD-10-CM | POA: Insufficient documentation

## 2013-11-11 DIAGNOSIS — Z91040 Latex allergy status: Secondary | ICD-10-CM | POA: Insufficient documentation

## 2013-11-11 DIAGNOSIS — I509 Heart failure, unspecified: Secondary | ICD-10-CM | POA: Insufficient documentation

## 2013-11-11 DIAGNOSIS — R011 Cardiac murmur, unspecified: Secondary | ICD-10-CM | POA: Insufficient documentation

## 2013-11-11 DIAGNOSIS — Z792 Long term (current) use of antibiotics: Secondary | ICD-10-CM | POA: Insufficient documentation

## 2013-11-11 DIAGNOSIS — I10 Essential (primary) hypertension: Secondary | ICD-10-CM

## 2013-11-11 DIAGNOSIS — D0590 Unspecified type of carcinoma in situ of unspecified breast: Secondary | ICD-10-CM

## 2013-11-11 DIAGNOSIS — Z9109 Other allergy status, other than to drugs and biological substances: Secondary | ICD-10-CM | POA: Insufficient documentation

## 2013-11-11 DIAGNOSIS — E119 Type 2 diabetes mellitus without complications: Secondary | ICD-10-CM | POA: Insufficient documentation

## 2013-11-11 DIAGNOSIS — N390 Urinary tract infection, site not specified: Secondary | ICD-10-CM

## 2013-11-11 DIAGNOSIS — Z170 Estrogen receptor positive status [ER+]: Secondary | ICD-10-CM | POA: Diagnosis present

## 2013-11-11 DIAGNOSIS — D0591 Unspecified type of carcinoma in situ of right breast: Secondary | ICD-10-CM

## 2013-11-11 LAB — CBC
HEMATOCRIT: 33 % — AB (ref 36.0–46.0)
HEMOGLOBIN: 10.7 g/dL — AB (ref 12.0–15.0)
MCH: 29.6 pg (ref 26.0–34.0)
MCHC: 32.4 g/dL (ref 30.0–36.0)
MCV: 91.2 fL (ref 78.0–100.0)
PLATELETS: 284 10*3/uL (ref 150–400)
RBC: 3.62 MIL/uL — AB (ref 3.87–5.11)
RDW: 14.3 % (ref 11.5–15.5)
WBC: 3.5 10*3/uL — ABNORMAL LOW (ref 4.0–10.5)

## 2013-11-11 LAB — GLUCOSE, CAPILLARY
Glucose-Capillary: 199 mg/dL — ABNORMAL HIGH (ref 70–99)
Glucose-Capillary: 177 mg/dL — ABNORMAL HIGH (ref 70–99)

## 2013-11-11 LAB — COMPREHENSIVE METABOLIC PANEL
ALK PHOS: 87 U/L (ref 39–117)
ALT: 10 U/L (ref 0–35)
AST: 16 U/L (ref 0–37)
Albumin: 3.3 g/dL — ABNORMAL LOW (ref 3.5–5.2)
BUN: 6 mg/dL (ref 6–23)
CHLORIDE: 99 meq/L (ref 96–112)
CO2: 24 meq/L (ref 19–32)
Calcium: 9 mg/dL (ref 8.4–10.5)
Creatinine, Ser: 0.64 mg/dL (ref 0.50–1.10)
GLUCOSE: 250 mg/dL — AB (ref 70–99)
POTASSIUM: 3.6 meq/L — AB (ref 3.7–5.3)
Sodium: 137 mEq/L (ref 137–147)
Total Bilirubin: 0.5 mg/dL (ref 0.3–1.2)
Total Protein: 6.6 g/dL (ref 6.0–8.3)

## 2013-11-11 LAB — CBC WITH DIFFERENTIAL/PLATELET
BASOS PCT: 1 % (ref 0–1)
Basophils Absolute: 0 10*3/uL (ref 0.0–0.1)
Eosinophils Absolute: 0.1 10*3/uL (ref 0.0–0.7)
Eosinophils Relative: 2 % (ref 0–5)
HCT: 32 % — ABNORMAL LOW (ref 36.0–46.0)
HEMOGLOBIN: 10.5 g/dL — AB (ref 12.0–15.0)
LYMPHS ABS: 1 10*3/uL (ref 0.7–4.0)
Lymphocytes Relative: 28 % (ref 12–46)
MCH: 29.8 pg (ref 26.0–34.0)
MCHC: 32.8 g/dL (ref 30.0–36.0)
MCV: 90.9 fL (ref 78.0–100.0)
MONOS PCT: 6 % (ref 3–12)
Monocytes Absolute: 0.2 10*3/uL (ref 0.1–1.0)
NEUTROS ABS: 2.2 10*3/uL (ref 1.7–7.7)
NEUTROS PCT: 63 % (ref 43–77)
Platelets: 274 10*3/uL (ref 150–400)
RBC: 3.52 MIL/uL — AB (ref 3.87–5.11)
RDW: 14.3 % (ref 11.5–15.5)
WBC: 3.4 10*3/uL — ABNORMAL LOW (ref 4.0–10.5)

## 2013-11-11 LAB — URINALYSIS, ROUTINE W REFLEX MICROSCOPIC
Glucose, UA: >1000 mg/dL — AB
KETONES UR: 15 mg/dL — AB
NITRITE: NEGATIVE
Specific Gravity, Urine: 1.027 (ref 1.005–1.030)
UROBILINOGEN UA: 1 mg/dL (ref 0.0–1.0)
pH: 5.5 (ref 5.0–8.0)

## 2013-11-11 LAB — RAPID URINE DRUG SCREEN, HOSP PERFORMED
Amphetamines: NOT DETECTED
Barbiturates: NOT DETECTED
Benzodiazepines: NOT DETECTED
Cocaine: NOT DETECTED
Opiates: NOT DETECTED
Tetrahydrocannabinol: NOT DETECTED

## 2013-11-11 LAB — URINE MICROSCOPIC-ADD ON

## 2013-11-11 LAB — CREATININE, SERUM
Creatinine, Ser: 0.7 mg/dL (ref 0.50–1.10)
GFR calc non Af Amer: >90 mL/min (ref >90)

## 2013-11-11 LAB — TROPONIN I
Troponin I: <0.3 ng/mL (ref <0.30)
Troponin I: <0.3 ng/mL (ref <0.30)
Troponin I: <0.3 ng/mL (ref <0.30)

## 2013-11-11 MED ORDER — LORAZEPAM 0.5 MG PO TABS: 0.5000 mg | ORAL_TABLET | Freq: Four times a day (QID) | ORAL | Status: DC | PRN

## 2013-11-11 MED ORDER — INSULIN ASPART 100 UNIT/ML ~~LOC~~ SOLN
0.0000 [IU] | Freq: Three times a day (TID) | SUBCUTANEOUS | Status: DC
Start: 2013-11-11 — End: 2013-11-13
  Administered 2013-11-11 – 2013-11-12 (×2): 3 [IU] via SUBCUTANEOUS
  Administered 2013-11-12: 5 [IU] via SUBCUTANEOUS
  Administered 2013-11-12 – 2013-11-13 (×2): 3 [IU] via SUBCUTANEOUS

## 2013-11-11 MED ORDER — GI COCKTAIL ~~LOC~~: 30.0000 mL | Freq: Four times a day (QID) | ORAL | Status: DC | PRN

## 2013-11-11 MED ORDER — CIPROFLOXACIN HCL 500 MG PO TABS
500.0000 mg | ORAL_TABLET | Freq: Two times a day (BID) | ORAL | Status: DC
  Administered 2013-11-11 – 2013-11-13 (×4): 500 mg via ORAL
  Filled 2013-11-11 (×6): qty 1

## 2013-11-11 MED ORDER — ENOXAPARIN SODIUM 40 MG/0.4ML ~~LOC~~ SOLN
40.0000 mg | SUBCUTANEOUS | Status: DC
  Administered 2013-11-11 – 2013-11-12 (×2): 40 mg via SUBCUTANEOUS
  Filled 2013-11-11 (×3): qty 0.4

## 2013-11-11 MED ORDER — HYDRALAZINE HCL 25 MG PO TABS
25.0000 mg | ORAL_TABLET | Freq: Two times a day (BID) | ORAL | Status: DC
  Administered 2013-11-11 – 2013-11-13 (×4): 25 mg via ORAL
  Filled 2013-11-11 (×5): qty 1

## 2013-11-11 MED ORDER — GABAPENTIN 300 MG PO CAPS
300.0000 mg | ORAL_CAPSULE | Freq: Three times a day (TID) | ORAL | Status: DC
Start: 2013-11-11 — End: 2013-11-13
  Administered 2013-11-11 – 2013-11-13 (×6): 300 mg via ORAL
  Filled 2013-11-11 (×8): qty 1

## 2013-11-11 MED ORDER — POTASSIUM CHLORIDE CRYS ER 20 MEQ PO TBCR
20.0000 meq | EXTENDED_RELEASE_TABLET | Freq: Two times a day (BID) | ORAL | Status: DC
  Administered 2013-11-11 – 2013-11-13 (×4): 20 meq via ORAL
  Filled 2013-11-11 (×5): qty 1

## 2013-11-11 MED ORDER — LOSARTAN POTASSIUM 50 MG PO TABS
100.0000 mg | ORAL_TABLET | Freq: Every day | ORAL | Status: DC
  Administered 2013-11-11 – 2013-11-13 (×3): 100 mg via ORAL
  Filled 2013-11-11 (×3): qty 2

## 2013-11-11 MED ORDER — BUPROPION HCL ER (SR) 150 MG PO TB12
150.0000 mg | ORAL_TABLET | Freq: Every day | ORAL | Status: DC
  Filled 2013-11-11: qty 1

## 2013-11-11 MED ORDER — ASPIRIN EC 325 MG PO TBEC
325.0000 mg | DELAYED_RELEASE_TABLET | Freq: Every day | ORAL | Status: DC
  Administered 2013-11-12 – 2013-11-13 (×2): 325 mg via ORAL
  Filled 2013-11-11 (×2): qty 1

## 2013-11-11 MED ORDER — MORPHINE SULFATE 2 MG/ML IJ SOLN
2.0000 mg | INTRAMUSCULAR | Status: DC | PRN
  Administered 2013-11-11: 2 mg via INTRAVENOUS
  Filled 2013-11-11: qty 1

## 2013-11-11 MED ORDER — INSULIN ASPART 100 UNIT/ML ~~LOC~~ SOLN: 0.0000 [IU] | Freq: Every day | SUBCUTANEOUS | Status: DC

## 2013-11-11 MED ORDER — PANTOPRAZOLE SODIUM 40 MG PO TBEC
40.0000 mg | DELAYED_RELEASE_TABLET | Freq: Every day | ORAL | Status: DC
  Administered 2013-11-11 – 2013-11-13 (×3): 40 mg via ORAL
  Filled 2013-11-11 (×3): qty 1

## 2013-11-11 MED ORDER — METOPROLOL SUCCINATE ER 50 MG PO TB24
50.0000 mg | ORAL_TABLET | Freq: Every day | ORAL | Status: DC
  Administered 2013-11-11 – 2013-11-13 (×3): 50 mg via ORAL
  Filled 2013-11-11 (×3): qty 1

## 2013-11-11 MED ORDER — AMLODIPINE BESYLATE 10 MG PO TABS
10.0000 mg | ORAL_TABLET | Freq: Every day | ORAL | Status: DC
  Administered 2013-11-11 – 2013-11-13 (×3): 10 mg via ORAL
  Filled 2013-11-11 (×3): qty 1

## 2013-11-11 MED ORDER — FUROSEMIDE 40 MG PO TABS
40.0000 mg | ORAL_TABLET | Freq: Every day | ORAL | Status: DC
  Administered 2013-11-11 – 2013-11-13 (×3): 40 mg via ORAL
  Filled 2013-11-11 (×3): qty 1

## 2013-11-11 MED ORDER — LINAGLIPTIN 5 MG PO TABS
5.0000 mg | ORAL_TABLET | Freq: Every day | ORAL | Status: DC
  Administered 2013-11-12 – 2013-11-13 (×2): 5 mg via ORAL
  Filled 2013-11-11 (×3): qty 1

## 2013-11-11 MED ORDER — ATORVASTATIN CALCIUM 10 MG PO TABS
10.0000 mg | ORAL_TABLET | Freq: Every day | ORAL | Status: DC
  Administered 2013-11-11 – 2013-11-12 (×2): 10 mg via ORAL
  Filled 2013-11-11 (×3): qty 1

## 2013-11-11 MED ORDER — BUPROPION HCL ER (XL) 150 MG PO TB24
150.0000 mg | ORAL_TABLET | Freq: Every day | ORAL | Status: DC
  Administered 2013-11-11 – 2013-11-13 (×3): 150 mg via ORAL
  Filled 2013-11-11 (×3): qty 1

## 2013-11-11 MED ORDER — IOHEXOL 350 MG/ML SOLN
100.0000 mL | Freq: Once | INTRAVENOUS | Status: AC | PRN
  Administered 2013-11-11: 100 mL via INTRAVENOUS

## 2013-11-11 NOTE — ED Notes (Addendum)
Pt is left arm restricted due to breast cancer. Bracelet placed on arm

## 2013-11-11 NOTE — ED Notes (Signed)
Unable to pull back blood from portacath for repeat troponin. Primary RN made aware. Phlebotomy contacted.

## 2013-11-11 NOTE — ED Notes (Signed)
Ice and numbing cream placed on portacath site for reaccess.

## 2013-11-11 NOTE — ED Provider Notes (Signed)
CSN: [id number]     Arrival date & time 11/11/13  0802 History   First MD Initiated Contact with Patient 11/11/13 0813     Chief Complaint  Patient presents with  . Chest Pain     (Consider location/radiation/quality/duration/timing/severity/associated sxs/prior Treatment) Patient is a 58 y.o. female presenting with chest pain.  Chest Pain Pain location:  Substernal area Pain quality: aching and sharp   Pain radiates to:  L shoulder Pain severity:  Severe Onset quality:  Gradual Duration: several hours. Timing:  Constant Progression:  Resolved Chronicity:  Recurrent (similar episode last week, which was self limited) Relieved by:  Nitroglycerin Worsened by:  Deep breathing and movement Associated symptoms: dizziness (for past few weeks)   Associated symptoms: no cough, no diaphoresis, no fever and no shortness of breath     Past Medical History  Diagnosis Date  . Hyperlipidemia   . Hypertension   . Bipolar 1 disorder   . Cancer   . Hypertension 04/04/2013  . Hyperlipidemia 04/04/2013  . Bipolar 1 disorder 04/04/2013  . Breast cancer 03/10/13    left breast invasive ca  . Allergy     latex  . Obesity   . Diabetes mellitus without complication     NIDDM  . Sleep apnea     no cpap due to insurance  . Heart murmur   . GERD (gastroesophageal reflux disease)   . Coronary artery disease    Past Surgical History  Procedure Laterality Date  . Cholecystectomy    . Breast biopsy Left 05/08/09    fibroadenoma with microcalcifications,no malignancy id  . Breast biopsy Left 03/07/13    invasive ca,1 lymph node metastatic  . 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 Abdominal hysterectomy    . Coronary stent placement  2001  . Cardiac catheterization    . Eye surgery  6/13,9/14    laser,cataract lft  . Coronary angioplasty      LAD stent 2003  . Portacath placement Right 04/22/2013    Procedure: INSERTION PORT-A-CATH;   Surgeon: Adin Hector, MD;  Location: [facility];  Service: General;  Laterality: Right;  . Mastectomy modified radical Left 04/22/2013    Procedure: MASTECTOMY MODIFIED RADICAL;  Surgeon: Adin Hector, MD;  Location: [facility];  Service: General;  Laterality: Left;  . Breast lumpectomy with needle localization and axillary sentinel lymph node bx Right 04/22/2013    Procedure: BREAST LUMPECTOMY WITH NEEDLE LOCALIZATION AND AXILLARY SENTINEL LYMPH NODE BX;  Surgeon: Adin Hector, MD;  Location: [facility];  Service: General;  Laterality: Right;  . Esophagogastroduodenoscopy N/A 08/07/2013    Procedure: ESOPHAGOGASTRODUODENOSCOPY (EGD);  Surgeon: Missy Sabins, MD;  Location: [facility];  Service: Endoscopy;  Laterality: N/A;   Family History  Problem Relation Age of Onset  . Heart disease Mother   . Breast cancer Maternal Aunt     dx in her 65s  . Heart disease Cousin 42    maternal cousin  . Heart attack Sister 70  . Cancer Paternal Aunt     NOS  . Cancer Maternal Aunt     NOS  . Lung cancer Cousin 2    maternal cousin - non smoker  . Brain cancer Cousin 13    maternal cousin's son  . Lung cancer Cousin 20    maternal cousin's son  . Breast cancer Cousin     paternal cousin   History  Substance Use Topics  .  Smoking status: Former Smoker -- 1.00 packs/day for 15 years    Types: Cigarettes    Quit date: 07/19/1987  . Smokeless tobacco: Never Used  . Alcohol Use: No   OB History   Grav Para Term Preterm Abortions TAB SAB Ect Mult Living                 Review of Systems  Constitutional: Negative for fever and diaphoresis.  Respiratory: Negative for cough and shortness of breath.   Cardiovascular: Positive for chest pain.  Neurological: Positive for dizziness (for past few weeks).  All other systems reviewed and are negative.     Allergies  Cephalexin and Adhesive  Home Medications   Prior to Admission medications   Medication Sig Start Date End Date Taking?  Authorizing Provider  amLODipine (NORVASC) 10 MG tablet Take 10 mg by mouth daily.   Yes Historical Provider, MD  b complex vitamins tablet Take 1 tablet by mouth daily.   Yes Historical Provider, MD  buPROPion (WELLBUTRIN SR) 150 MG 12 hr tablet Take 1 tablet (150 mg total) by mouth daily. 08/30/13  Yes [name], MD  Cholecalciferol (VITAMIN D PO) Take 1 tablet by mouth daily.   Yes Historical Provider, MD  fluticasone (FLONASE) 50 MCG/ACT nasal spray Place 1 spray into both nostrils daily. 11/01/13  Yes Minette Headland, NP  furosemide (LASIX) 40 MG tablet Take 1 tablet (40 mg total) by mouth daily. 11/08/13  Yes Minette Headland, NP  gabapentin (NEURONTIN) 100 MG capsule Take 3 capsules (300 mg total) by mouth 3 (three) times daily. 09/13/13  Yes Minette Headland, NP  hydrALAZINE (APRESOLINE) 25 MG tablet Take 25 mg by mouth 2 (two) times daily.   Yes Historical Provider, MD  HYDROcodone-acetaminophen (NORCO/VICODIN) 5-325 MG per tablet Take 1-2 tablets by mouth every 4 (four) hours as needed for moderate pain.   Yes Historical Provider, MD  levofloxacin (LEVAQUIN) 500 MG tablet Take 1 tablet (500 mg total) by mouth daily. 11/01/13  Yes Minette Headland, NP  lidocaine-prilocaine (EMLA) cream Apply topically as needed. Apply to port-a-cath one - two hours before accessing. 08/30/13  Yes [name], MD  LORazepam (ATIVAN) 0.5 MG tablet Take 1 tablet (0.75m) every 6 hours as needed for nausea or vomiting. 08/30/13  Yes KDeatra Robinson MD  losartan (COZAAR) 100 MG tablet Take 100 mg by mouth daily.   Yes Historical Provider, MD  metoprolol succinate (TOPROL-XL) 25 MG 24 hr tablet Take 25 mg by mouth daily.   Yes Historical Provider, MD  ondansetron (ZOFRAN) 8 MG tablet Take 1 tablet (883m) every 12 hours as needed for nausea or vomiting starting the 3rd day after chemotherapy. 05/07/13  Yes KaDeatra RobinsonMD  pantoprazole (PROTONIX) 40 MG tablet Take 1 tablet (40 mg total) by mouth daily.  08/09/13  Yes AlRobbie LisMD  pioglitazone-metformin (ACTOPLUS MET) [phone] per tablet Take 1 tablet by mouth every morning.    Yes Historical Provider, MD  potassium chloride SA (K-DUR,KLOR-CON) 20 MEQ tablet Take 1 tablet (20 mEq total) by mouth 2 (two) times daily. 11/08/13  Yes LiMinette HeadlandNP  rosuvastatin (CRESTOR) 5 MG tablet Take 5 mg by mouth daily.   Yes Historical Provider, MD  sitaGLIPtin (JANUVIA) 100 MG tablet Take 100 mg by mouth daily.   Yes Historical Provider, MD   BP 166/102  Pulse 108  Temp(Src) 98.3 F (36.8 C) (Oral)  Resp 20  SpO2  94% Physical Exam  Nursing note and vitals reviewed. Constitutional: She is oriented to person, place, and time. She appears well-developed and well-nourished. No distress.  HENT:  Head: Normocephalic and atraumatic.  Mouth/Throat: Oropharynx is clear and moist.  Eyes: Conjunctivae are normal. Pupils are equal, round, and reactive to light. No scleral icterus.  Neck: Neck supple.  Cardiovascular: Normal rate, regular rhythm, normal heart sounds and intact distal pulses.   No murmur heard. Pulmonary/Chest: Effort normal and breath sounds normal. No stridor. No respiratory distress. She has no rales. She exhibits tenderness (mild with sternal palpation).  Abdominal: Soft. Bowel sounds are normal. She exhibits no distension. There is no tenderness. There is no rebound and no guarding.  Musculoskeletal: Normal range of motion.  Neurological: She is alert and oriented to person, place, and time.  Skin: Skin is warm and dry. No rash noted.  Psychiatric: She has a normal mood and affect. Her behavior is normal.    ED Course  Procedures (including critical care time) Labs Review Labs Reviewed  CBC WITH DIFFERENTIAL - Abnormal; Notable for the following:    WBC 3.4 (*)    RBC 3.52 (*)    Hemoglobin 10.5 (*)    HCT 32.0 (*)    All other components within normal limits  COMPREHENSIVE METABOLIC PANEL - Abnormal; Notable for the  following:    Potassium 3.6 (*)    Glucose, Bld 250 (*)    Albumin 3.3 (*)    All other components within normal limits  URINALYSIS, ROUTINE W REFLEX MICROSCOPIC - Abnormal; Notable for the following:    APPearance CLOUDY (*)    Glucose, UA >1000 (*)    Hgb urine dipstick TRACE (*)    Bilirubin Urine SMALL (*)    Ketones, ur 15 (*)    Protein, ur >300 (*)    Leukocytes, UA SMALL (*)    All other components within normal limits  URINE MICROSCOPIC-ADD ON - Abnormal; Notable for the following:    Bacteria, UA MANY (*)    All other components within normal limits  GLUCOSE, CAPILLARY - Abnormal; Notable for the following:    Glucose-Capillary 199 (*)    All other components within normal limits  URINE CULTURE  TROPONIN I  URINE RAPID DRUG SCREEN (HOSP PERFORMED)  TROPONIN I  TROPONIN I  TROPONIN I  TROPONIN I  CBC  CREATININE, SERUM  LIPID PANEL    Imaging Review Dg Chest 2 View  11/11/2013   CLINICAL DATA:  Shortness of breath, cough, substernal chest pain  EXAM: CHEST  2 VIEW  COMPARISON:  11/04/2013  FINDINGS: Right IJ power port catheter tip mid right atrium. Mild cardiomegaly with vascular congestion and basilar atelectasis. No definite focal pneumonia, collapse or consolidation. No edema, effusion or pneumothorax. Trachea midline. Prior left mastectomy, axillary lymph node dissection, and cholecystectomy.  IMPRESSION: Cardiomegaly with vascular congestion and basilar atelectasis.   Electronically Signed   By: Daryll Brod M.D.   On: 11/11/2013 10:24   Ct Angio Chest Pe W/cm &/or Wo Cm  11/11/2013   CLINICAL DATA:  Chest pain.  EXAM: CT ANGIOGRAPHY CHEST WITH CONTRAST  TECHNIQUE: Multidetector CT imaging of the chest was performed using the standard protocol during bolus administration of intravenous contrast. Multiplanar CT image reconstructions and MIPs were obtained to evaluate the vascular anatomy.  CONTRAST:  178m OMNIPAQUE IOHEXOL 350 MG/ML SOLN  COMPARISON:  Prior PET CT  04/29/2013.  Chest CT 04/17/2013.  FINDINGS: No filling defects in the  pulmonary arteries to suggest pulmonary emboli. There is mild cardiomegaly. Aorta is normal caliber. No pleural or pericardial effusion. The mid to distal left anterior descending coronary artery is heavily calcified. This is a stable finding. No mediastinal, hilar, or axillary adenopathy.  Right Port-A-Cath is in place. This is unchanged since prior PET CT. Surgical clips in the left axilla. Chest wall soft tissues grossly unremarkable.  Review of the lung windows demonstrates scattered ground-glass opacities, most in the lower lobes. This may reflect dependent atelectasis or less likely early edema.  No acute bony abnormality or focal bone lesion.  Imaging into the upper abdomen shows no acute findings.  Review of the MIP images confirms the above findings.  IMPRESSION: No evidence of pulmonary embolus.  Mild cardiomegaly. Scattered lower lobe ground-glass opacities may reflect dependent atelectasis or less likely early edema.  Coronary artery disease.   Electronically Signed   By: Rolm Baptise M.D.   On: 11/11/2013 11:45  All radiology studies independently viewed by me.      EKG Interpretation None     EKG - sinus tachy, rate 108, leftward axis, normal intervals, nonspecific t changes,compared to prior, rate increased, baseline wander    MDM   Final diagnoses:  Chest pain    58 year old female presenting with chest pain.  History of CAD and cancer.  Given 324 prior to arrival.  ED workup reassuring including negative CT PE study.  Will be admitted for observation and ACS rule out due to her history of coronary disease.    Houston Siren III, MD 11/11/13 [phone]

## 2013-11-11 NOTE — ED Notes (Signed)
Pt undressed, in gown, on monitor, continuous pulse oximetry and blood pressure cuff; warm blanket given

## 2013-11-11 NOTE — ED Notes (Signed)
MD at bedside. 

## 2013-11-11 NOTE — H&P (Addendum)
[facility] History and Physical  Kathy Maxwell BJS:[id number] DOB: 06/06/1956 DOA: 11/11/2013  Referring physician:  PCP: Maximino Greenland, MD   Chief Complaint: Chest pain  HPI: Kathy Maxwell is a 58 y.o. 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 Patient reports having chest pain last Friday  located in the retrosternal region which spontaneously resolved. She woke up this morning at approximately 3 AM with chest pain again located in the retrosternal region, characterized as tight/pressure like, with radiation to her left shoulder. Her chest pain has remained constant  over the course of the morning, growing concern for which she presented to the emergency room. She states accompanying dizziness, mild shortness of breath, denies cough, sputum production, nausea, vomiting, syncope, presyncope, palpitations or recurrent falls. She reports compliance to all of her medications. In the emergency department EKG did not reveal acute ischemic changes. Troponin was within normal limits. Chest x-ray showed cardiomegaly with vascular congestion.                                                                                                                                            Review of Systems:  Constitutional:  No weight loss, night sweats, Fevers, chills, fatigue.  HEENT:  No headaches, Difficulty swallowing,Tooth/dental problems,Sore throat,  No sneezing, itching, ear ache, nasal congestion, post nasal drip,  Cardio-vascular:  Positive for chest pain, denies Orthopnea, PND, positive for swelling in lower extremities and dizziness, denies anasarca,  palpitations  GI:  No heartburn, indigestion, abdominal pain, nausea, vomiting, diarrhea, change in bowel habits, loss of appetite  Resp:  Positive for shortness of breath with  exertion or at rest. No excess mucus, no productive cough, No non-productive cough, No coughing up of blood.No change in color of mucus.No wheezing.No chest wall deformity  Skin:  no rash or lesions.  GU:  no dysuria, change in color of urine, no urgency or frequency. No flank pain.  Musculoskeletal:  No joint pain or swelling. No decreased range of motion. No back pain.  Psych:  No change in mood or affect. No depression or anxiety. No memory loss.   Past Medical History  Diagnosis Date  . Hyperlipidemia   . Hypertension   . Bipolar 1 disorder   . Cancer   . Hypertension 04/04/2013  . Hyperlipidemia 04/04/2013  . Bipolar 1 disorder 04/04/2013  . Breast cancer 03/10/13    left breast invasive ca  . Allergy     latex  . Obesity   . Diabetes mellitus without complication     NIDDM  . Sleep apnea     no cpap due to insurance  . Heart murmur   . GERD (gastroesophageal reflux disease)   . Coronary artery disease  Past Surgical History  Procedure Laterality Date  . Cholecystectomy    . Breast biopsy Left 05/08/09    fibroadenoma with microcalcifications,no malignancy id  . Breast biopsy Left 03/07/13    invasive ca,1 lymph node metastatic  . 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 Abdominal hysterectomy    . Coronary stent placement  2001  . Cardiac catheterization    . Eye surgery  6/13,9/14    laser,cataract lft  . Coronary angioplasty      LAD stent 2003  . Portacath placement Right 04/22/2013    Procedure: INSERTION PORT-A-CATH;  Surgeon: Adin Hector, MD;  Location: [place];  Service: General;  Laterality: Right;  . Mastectomy modified radical Left 04/22/2013    Procedure: MASTECTOMY MODIFIED RADICAL;  Surgeon: Adin Hector, MD;  Location: [place];  Service: General;  Laterality: Left;  . Breast lumpectomy with needle localization and axillary sentinel lymph node bx Right 04/22/2013    Procedure: BREAST LUMPECTOMY  WITH NEEDLE LOCALIZATION AND AXILLARY SENTINEL LYMPH NODE BX;  Surgeon: Adin Hector, MD;  Location: [place];  Service: General;  Laterality: Right;  . Esophagogastroduodenoscopy N/A 08/07/2013    Procedure: ESOPHAGOGASTRODUODENOSCOPY (EGD);  Surgeon: Missy Sabins, MD;  Location: [facility];  Service: Endoscopy;  Laterality: N/A;   Social History:  reports that she quit smoking about 26 years ago. Her smoking use included Cigarettes. She has a 15 pack-year smoking history. She has never used smokeless tobacco. She reports that she does not drink alcohol or use illicit drugs.  Allergies  Allergen Reactions  . Cephalexin Other (See Comments)    Burned throat.  . Adhesive [Tape] Rash    Clear tape gloves ,elastic no problem    Family History  Problem Relation Age of Onset  . Heart disease Mother   . Breast cancer Maternal Aunt     dx in her 63s  . Heart disease Cousin 48    maternal cousin  . Heart attack Sister 62  . Cancer Paternal Aunt     NOS  . Cancer Maternal Aunt     NOS  . Lung cancer Cousin 10    maternal cousin - non smoker  . Brain cancer Cousin 13    maternal cousin's son  . Lung cancer Cousin 64    maternal cousin's son  . Breast cancer Cousin     paternal cousin     Prior to Admission medications   Medication Sig Start Date End Date Taking? Authorizing Provider  amLODipine (NORVASC) 10 MG tablet Take 10 mg by mouth daily.   Yes Historical Provider, MD  b complex vitamins tablet Take 1 tablet by mouth daily.   Yes Historical Provider, MD  buPROPion (WELLBUTRIN SR) 150 MG 12 hr tablet Take 1 tablet (150 mg total) by mouth daily. 08/30/13  Yes [name], MD  Cholecalciferol (VITAMIN D PO) Take 1 tablet by mouth daily.   Yes Historical Provider, MD  fluticasone (FLONASE) 50 MCG/ACT nasal spray Place 1 spray into both nostrils daily. 11/01/13  Yes Minette Headland, NP  furosemide (LASIX) 40 MG tablet Take 1 tablet (40 mg total) by mouth daily. 11/08/13  Yes  Minette Headland, NP  gabapentin (NEURONTIN) 100 MG capsule Take 3 capsules (300 mg total) by mouth 3 (three) times daily. 09/13/13  Yes Minette Headland, NP  hydrALAZINE (APRESOLINE) 25 MG tablet Take 25 mg by mouth 2 (two) times daily.  Yes Historical Provider, MD  HYDROcodone-acetaminophen (NORCO/VICODIN) 5-325 MG per tablet Take 1-2 tablets by mouth every 4 (four) hours as needed for moderate pain.   Yes Historical Provider, MD  levofloxacin (LEVAQUIN) 500 MG tablet Take 1 tablet (500 mg total) by mouth daily. 11/01/13  Yes Minette Headland, NP  lidocaine-prilocaine (EMLA) cream Apply topically as needed. Apply to port-a-cath one - two hours before accessing. 08/30/13  Yes [name], MD  LORazepam (ATIVAN) 0.5 MG tablet Take 1 tablet (0.58m) every 6 hours as needed for nausea or vomiting. 08/30/13  Yes KDeatra Robinson MD  losartan (COZAAR) 100 MG tablet Take 100 mg by mouth daily.   Yes Historical Provider, MD  metoprolol succinate (TOPROL-XL) 25 MG 24 hr tablet Take 25 mg by mouth daily.   Yes Historical Provider, MD  ondansetron (ZOFRAN) 8 MG tablet Take 1 tablet (876m) every 12 hours as needed for nausea or vomiting starting the 3rd day after chemotherapy. 05/07/13  Yes KaDeatra RobinsonMD  pantoprazole (PROTONIX) 40 MG tablet Take 1 tablet (40 mg total) by mouth daily. 08/09/13  Yes AlRobbie LisMD  pioglitazone-metformin (ACTOPLUS MET) 15801-172-8843G per tablet Take 1 tablet by mouth every morning.    Yes Historical Provider, MD  potassium chloride SA (K-DUR,KLOR-CON) 20 MEQ tablet Take 1 tablet (20 mEq total) by mouth 2 (two) times daily. 11/08/13  Yes LiMinette HeadlandNP  rosuvastatin (CRESTOR) 5 MG tablet Take 5 mg by mouth daily.   Yes Historical Provider, MD  sitaGLIPtin (JANUVIA) 100 MG tablet Take 100 mg by mouth daily.   Yes Historical Provider, MD   Physical Exam: Filed Vitals:   11/11/13 1359  BP: 183/80  Pulse: 108  Temp:   Resp: 17    BP 183/80  Pulse 108   Temp(Src) 98.3 F (36.8 C) (Oral)  Resp 17  SpO2 95%  General:  Appears calm and comfortable, no acute distress. Eyes: PERRL, normal lids, irises & conjunctiva ENT: grossly normal hearing, lips & tongue Neck: no LAD, masses or thyromegaly Cardiovascular: RRR, no m/r/g. No LE edema. Telemetry: SR, no arrhythmias  Respiratory: CTA bilaterally, no w/r/r. Normal respiratory effort. Abdomen: soft, ntnd Skin: no rash or induration seen on limited exam Musculoskeletal: Patient's chest pain is report reproducible to palpation, over left-sided chest, mastectomy site. She has trace edema to lower extremities bilaterally Psychiatric: grossly normal mood and affect, speech fluent and appropriate Neurologic: grossly non-focal.          Labs on Admission:  Basic Metabolic Panel:  Recent Labs Lab 11/08/13 1023 11/11/13 0910  NA 138 137  K 3.2* 3.6*  CL  --  99  CO2 24 24  GLUCOSE 298* 250*  BUN 6.5* 6  CREATININE 0.9 0.64  CALCIUM 9.6 9.0   Liver Function Tests:  Recent Labs Lab 11/08/13 1023 11/11/13 0910  AST 14 16  ALT 9 10  ALKPHOS 102 87  BILITOT 0.53 0.5  PROT 6.7 6.6  ALBUMIN 3.4* 3.3*   No results found for this basename: LIPASE, AMYLASE,  in the last 168 hours No results found for this basename: AMMONIA,  in the last 168 hours CBC:  Recent Labs Lab 11/08/13 1023 11/11/13 0910  WBC 7.0 3.4*  NEUTROABS 4.9 2.2  HGB 11.4* 10.5*  HCT 34.7* 32.0*  MCV 91.6 90.9  PLT 337 274   Cardiac Enzymes:  Recent Labs Lab 11/11/13 0910  TROPONINI <0.30    BNP (last 3 results) No  results found for this basename: PROBNP,  in the last 8760 hours CBG: No results found for this basename: GLUCAP,  in the last 168 hours  Radiological Exams on Admission: Dg Chest 2 View  11/11/2013   CLINICAL DATA:  Shortness of breath, cough, substernal chest pain  EXAM: CHEST  2 VIEW  COMPARISON:  11/04/2013  FINDINGS: Right IJ power port catheter tip mid right atrium. Mild  cardiomegaly with vascular congestion and basilar atelectasis. No definite focal pneumonia, collapse or consolidation. No edema, effusion or pneumothorax. Trachea midline. Prior left mastectomy, axillary lymph node dissection, and cholecystectomy.  IMPRESSION: Cardiomegaly with vascular congestion and basilar atelectasis.   Electronically Signed   By: Daryll Brod M.D.   On: 11/11/2013 10:24   Ct Angio Chest Pe W/cm &/or Wo Cm  11/11/2013   CLINICAL DATA:  Chest pain.  EXAM: CT ANGIOGRAPHY CHEST WITH CONTRAST  TECHNIQUE: Multidetector CT imaging of the chest was performed using the standard protocol during bolus administration of intravenous contrast. Multiplanar CT image reconstructions and MIPs were obtained to evaluate the vascular anatomy.  CONTRAST:  168m OMNIPAQUE IOHEXOL 350 MG/ML SOLN  COMPARISON:  Prior PET CT 04/29/2013.  Chest CT 04/17/2013.  FINDINGS: No filling defects in the pulmonary arteries to suggest pulmonary emboli. There is mild cardiomegaly. Aorta is normal caliber. No pleural or pericardial effusion. The mid to distal left anterior descending coronary artery is heavily calcified. This is a stable finding. No mediastinal, hilar, or axillary adenopathy.  Right Port-A-Cath is in place. This is unchanged since prior PET CT. Surgical clips in the left axilla. Chest wall soft tissues grossly unremarkable.  Review of the lung windows demonstrates scattered ground-glass opacities, most in the lower lobes. This may reflect dependent atelectasis or less likely early edema.  No acute bony abnormality or focal bone lesion.  Imaging into the upper abdomen shows no acute findings.  Review of the MIP images confirms the above findings.  IMPRESSION: No evidence of pulmonary embolus.  Mild cardiomegaly. Scattered lower lobe ground-glass opacities may reflect dependent atelectasis or less likely early edema.  Coronary artery disease.   Electronically Signed   By: KRolm BaptiseM.D.   On: 11/11/2013 11:45     EKG: Independently reviewed. No ST segment changes or T wave inversions  Assessment/Plan Principal Problem:   Chest pain Active Problems:   Breast cancer of upper-outer quadrant of left female breast   Hypertension   Hyperlipidemia   Bipolar 1 disorder   1. Chest pain. Patient presenting with chest pain having atypical features. Chest pain. Reproducible along with physical examination. She does have multiple cardiovascular risk factors include hypertension and diabetes mellitus. Troponin was negative as EKG did not reveal ischemic changes. Will place patient in overnight observation, cycle troponin, obtain fasting lipid panel in a.m. Of note a CT scan of lungs with IV contrast performed in the emergency room reveal evidence of pulmonary embolism. Continue aspirin 325 mg by mouth daily, continue statin and beta blocker therapy. 2. Hypertension. Patient on multiple antihypertensive agents, presented with elevated blood sugars. Will increase her metoprolol to 50 mg by mouth daily, will continue amlodipine 10 mg by mouth daily, losartan 100 mg by mouth daily and hydralazine 25 mg by mouth twice a day 3. Dyslipidemia. Continue statin therapy check a fasting lipid panel 4. 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 Currently on adjuvant chemotherapy. Patient was started on Abraxane on 08/30/13.  5. Chronic diastolic congestive heart failure. Status post transthoracic echocardiogram on 04/18/2013 showing ejection fraction of 55-60% without wall motion abnormalities. Grade 1 diastolic dysfunction here continue Lasix therapy.  6. UTI. Start Cipro 500 mg PO BID. Patient is afebrile, nontoxic.  7. DVT prophylaxis. Lovenox    Code Status: Full Code Family Communication:  Disposition Plan: Anticipate patient will require greater than 2 night hospitalization, place her in overnight observation  Time spent: 28  min  [facility] Pager [phone]

## 2013-11-11 NOTE — ED Notes (Signed)
Per EMS- pt had 9/10 chest pain that was resolved with 2 nitro. Pt received 324mg  of Asprin. Pt has hx of breast cancer and received chemo on Friday. Pt reports mild SOB and headache. Pt states that movement and coughing made the pain worse. Pt had heart cath with stent placement 10 years ago. Pt states that she had this same pain approx 1 month ago but did not see doctor because the pain resolved on its own.

## 2013-11-11 NOTE — ED Notes (Signed)
Pt placed back on monitor.

## 2013-11-11 NOTE — Telephone Encounter (Signed)
Per staff message and POF I have scheduled appts.  JMW  

## 2013-11-11 NOTE — ED Notes (Signed)
Pt ambulated well with little assistance from bathroom back to her room; Pt is now being transported to CT

## 2013-11-12 ENCOUNTER — Observation Stay (HOSPITAL_COMMUNITY): Payer: Medicare HMO

## 2013-11-12 ENCOUNTER — Telehealth: Payer: Self-pay | Admitting: Adult Health

## 2013-11-12 DIAGNOSIS — F319 Bipolar disorder, unspecified: Secondary | ICD-10-CM

## 2013-11-12 DIAGNOSIS — I5032 Chronic diastolic (congestive) heart failure: Secondary | ICD-10-CM

## 2013-11-12 DIAGNOSIS — N390 Urinary tract infection, site not specified: Secondary | ICD-10-CM | POA: Diagnosis present

## 2013-11-12 DIAGNOSIS — I509 Heart failure, unspecified: Secondary | ICD-10-CM

## 2013-11-12 DIAGNOSIS — R079 Chest pain, unspecified: Secondary | ICD-10-CM

## 2013-11-12 LAB — GLUCOSE, CAPILLARY
GLUCOSE-CAPILLARY: 191 mg/dL — AB (ref 70–99)
GLUCOSE-CAPILLARY: 207 mg/dL — AB (ref 70–99)
GLUCOSE-CAPILLARY: 185 mg/dL — AB (ref 70–99)
Glucose-Capillary: 167 mg/dL — ABNORMAL HIGH (ref 70–99)

## 2013-11-12 LAB — LIPID PANEL
CHOL/HDL RATIO: 2.7 ratio
Cholesterol: 159 mg/dL (ref 0–200)
HDL: 59 mg/dL (ref >39)
LDL CALC: 81 mg/dL (ref 0–99)
Triglycerides: 97 mg/dL (ref <150)
VLDL: 19 mg/dL (ref 0–40)

## 2013-11-12 LAB — TROPONIN I: Troponin I: <0.3 ng/mL (ref <0.30)

## 2013-11-12 MED ORDER — REGADENOSON 0.4 MG/5ML IV SOLN
INTRAVENOUS | Status: AC
  Administered 2013-11-12: 0.4 mg via INTRAVENOUS
  Filled 2013-11-12: qty 5

## 2013-11-12 MED ORDER — ACETAMINOPHEN 500 MG PO TABS
1000.0000 mg | ORAL_TABLET | Freq: Once | ORAL | Status: AC
  Administered 2013-11-12: 975 mg via ORAL

## 2013-11-12 MED ORDER — REGADENOSON 0.4 MG/5ML IV SOLN
0.4000 mg | Freq: Once | INTRAVENOUS | Status: AC
  Administered 2013-11-12: 0.4 mg via INTRAVENOUS

## 2013-11-12 MED ORDER — TECHNETIUM TC 99M SESTAMIBI GENERIC - CARDIOLITE
30.0000 | Freq: Once | INTRAVENOUS | Status: AC | PRN
  Administered 2013-11-12: 30 via INTRAVENOUS

## 2013-11-12 MED ORDER — ACETAMINOPHEN 325 MG PO TABS
ORAL_TABLET | ORAL | Status: AC
  Administered 2013-11-12: 975 mg via ORAL
  Filled 2013-11-12: qty 3

## 2013-11-12 MED ORDER — TECHNETIUM TC 99M SESTAMIBI GENERIC - CARDIOLITE
10.0000 | Freq: Once | INTRAVENOUS | Status: AC | PRN
  Administered 2013-11-12: 10 via INTRAVENOUS

## 2013-11-12 NOTE — Progress Notes (Signed)
Discussed case with Dr. Tana Coast. Chest pain with typical and atypical features. Prior history of LAD stent in 2003. CTA of chest shows multivessel CAD. Would recommend stress testing today to further evaluate etiology of her chest pain.  Pixie Casino, MD, [facility] Attending Cardiologist [place]

## 2013-11-12 NOTE — Progress Notes (Addendum)
Patient ID: Kathy Maxwell  female  VEL:[id number]    DOB: [birth year]    DOA: 11/11/2013  PCP: Maximino Greenland, MD  Addendum:  Stress test results explained to the patient. She still feels some chest discomfort and wants gi evaluation. Previous EGD done 1/15 by Dr Amedeo Plenty, had shown pill esophagitis but patient did not follow-up.  I have requested [facility] to see her, d/w Dr Penelope Coop for recommendations.   Ripudeep Krystal Eaton M.D. [facility] 11/12/2013, 3:54 PM  Pager: [phone]       Assessment/Plan: Principal Problem:   Chest pain: Still having some chest discomfort, however reproducible on exam. This factors include hypertension, diabetes mellitus, prior history of coronary artery disease, LAD stent 2003. Patient ruled out for acute ACS. Cardiology consulted, seen by Dr. Debara Pickett. Patient was recommended nuclear medicine stress test. Troponins negative. EKG did not reveal acute ischemic changes. CT angiogram scan of the chest did not show any acute pulmonary embolism.  UTI - Urine culture more than 100,000 colonies of gram-negative rods - Placed on oral ciprofloxacin, follow culture results.     Breast cancer of upper-outer quadrant of left female breast Followup patient with Dr. Humphrey Rolls    Hypertension Currently stable, increased metoprolol to 50 mg, continue amlodipine, losartan and hydralazine.    Hyperlipidemia LDL 81, cholesterol 159    Bipolar 1 disorder stable    Chronic diastolic CHF (congestive heart failure)- stable and compensated  DVT Prophylaxis:  Code Status:  Family Communication:  Disposition:   Consultants: Cardiology Procedures:  Nuclear medicine stress test  Antibiotics:  None    Subjective: Still complaining of some chest discomfort, reproducible  Objective: Weight change:   Intake/Output Summary (Last 24 hours) at 11/12/13 1308 Last data filed at 11/11/13 2100  Gross per 24 hour  Intake    240 ml  Output      0 ml  Net    240 ml    Blood pressure 134/59, pulse 85, temperature 98.4 F (36.9 C), temperature source Oral, resp. rate 16, height 5\' 2"  (1.575 m), weight 88.406 kg (194 lb 14.4 oz), SpO2 100.00%.  Physical Exam: General: Alert and awake, oriented x3, not in any acute distress. CVS: S1-S2 clear, no murmur rubs or gallops Chest: clear to auscultation bilaterally, no wheezing, rales or rhonchi, mild chest wall tenderness Abdomen: soft nontender, nondistended, normal bowel sounds  Extremities: no cyanosis, clubbing or edema noted bilaterally   Lab Results: Basic Metabolic Panel:  Recent Labs Lab 11/08/13 1023 11/11/13 0910 11/11/13 1800  NA 138 137  --   K 3.2* 3.6*  --   CL  --  99  --   CO2 24 24  --   GLUCOSE 298* 250*  --   BUN 6.5* 6  --   CREATININE 0.9 0.64 0.70  CALCIUM 9.6 9.0  --    Liver Function Tests:  Recent Labs Lab 11/08/13 1023 11/11/13 0910  AST 14 16  ALT 9 10  ALKPHOS 102 87  BILITOT 0.53 0.5  PROT 6.7 6.6  ALBUMIN 3.4* 3.3*   No results found for this basename: LIPASE, AMYLASE,  in the last 168 hours No results found for this basename: AMMONIA,  in the last 168 hours CBC:  Recent Labs Lab 11/11/13 0910 11/11/13 1800  WBC 3.4* 3.5*  NEUTROABS 2.2  --   HGB 10.5* 10.7*  HCT 32.0* 33.0*  MCV 90.9 91.2  PLT 274 284   Cardiac Enzymes:  Recent Labs Lab 11/11/13  1534 11/11/13 1800 11/11/13 2250  TROPONINI <0.30 <0.30 <0.30   BNP: No components found with this basename: POCBNP,  CBG:  Recent Labs Lab 11/11/13 1646 11/11/13 2041 11/12/13 0734  GLUCAP 199* 177* 191*     Micro Results: Recent Results (from the past 240 hour(s))  URINE CULTURE     Status: None   Collection Time    11/11/13 10:54 AM      Result Value Ref Range Status   Specimen Description URINE, CLEAN CATCH   Final   Special Requests NONE   Final   Culture  Setup Time     Final   Value: 11/11/2013 16:55     Performed at [place]     Final    Value: >=100,000 COLONIES/ML     Performed at [facility]   Culture     Final   Value: [place]     Performed at [facility]   Report Status PENDING   Incomplete    Studies/Results: Dg Chest 2 View  11/11/2013   CLINICAL DATA:  Shortness of breath, cough, substernal chest pain  EXAM: CHEST  2 VIEW  COMPARISON:  11/04/2013  FINDINGS: Right IJ power port catheter tip mid right atrium. Mild cardiomegaly with vascular congestion and basilar atelectasis. No definite focal pneumonia, collapse or consolidation. No edema, effusion or pneumothorax. Trachea midline. Prior left mastectomy, axillary lymph node dissection, and cholecystectomy.  IMPRESSION: Cardiomegaly with vascular congestion and basilar atelectasis.   Electronically Signed   By: Daryll Brod M.D.   On: 11/11/2013 10:24   Dg Chest 2 View  11/04/2013   CLINICAL DATA:  Cough, congestion  EXAM: CHEST  2 VIEW  COMPARISON:  DG CHEST 2 VIEW dated 08/06/2013  FINDINGS: There is a right-sided Port-A-Cath with the tip at the cavoatrial junction. There is no focal parenchymal opacity, pleural effusion, or pneumothorax. The heart and mediastinal contours are unremarkable.  There are surgical clips in the left axilla.  The osseous structures are unremarkable.  IMPRESSION: No active cardiopulmonary disease.   Electronically Signed   By: Kathreen Devoid   On: 11/04/2013 11:39   Ct Angio Chest Pe W/cm &/or Wo Cm  11/11/2013   CLINICAL DATA:  Chest pain.  EXAM: CT ANGIOGRAPHY CHEST WITH CONTRAST  TECHNIQUE: Multidetector CT imaging of the chest was performed using the standard protocol during bolus administration of intravenous contrast. Multiplanar CT image reconstructions and MIPs were obtained to evaluate the vascular anatomy.  CONTRAST:  114mL OMNIPAQUE IOHEXOL 350 MG/ML SOLN  COMPARISON:  Prior PET CT 04/29/2013.  Chest CT 04/17/2013.  FINDINGS: No filling defects in the pulmonary arteries to suggest pulmonary emboli. There is mild  cardiomegaly. Aorta is normal caliber. No pleural or pericardial effusion. The mid to distal left anterior descending coronary artery is heavily calcified. This is a stable finding. No mediastinal, hilar, or axillary adenopathy.  Right Port-A-Cath is in place. This is unchanged since prior PET CT. Surgical clips in the left axilla. Chest wall soft tissues grossly unremarkable.  Review of the lung windows demonstrates scattered ground-glass opacities, most in the lower lobes. This may reflect dependent atelectasis or less likely early edema.  No acute bony abnormality or focal bone lesion.  Imaging into the upper abdomen shows no acute findings.  Review of the MIP images confirms the above findings.  IMPRESSION: No evidence of pulmonary embolus.  Mild cardiomegaly. Scattered lower lobe ground-glass opacities may reflect dependent atelectasis  or less likely early edema.  Coronary artery disease.   Electronically Signed   By: Rolm Baptise M.D.   On: 11/11/2013 11:45    Medications: Scheduled Meds: . amLODipine  10 mg Oral Daily  . aspirin EC  325 mg Oral Daily  . atorvastatin  10 mg Oral q1800  . buPROPion  150 mg Oral Daily  . ciprofloxacin  500 mg Oral BID  . enoxaparin (LOVENOX) injection  40 mg Subcutaneous Q24H  . furosemide  40 mg Oral Daily  . gabapentin  300 mg Oral TID  . hydrALAZINE  25 mg Oral BID  . insulin aspart  0-15 Units Subcutaneous TID WC  . insulin aspart  0-5 Units Subcutaneous QHS  . linagliptin  5 mg Oral Daily  . losartan  100 mg Oral Daily  . metoprolol succinate  50 mg Oral Daily  . pantoprazole  40 mg Oral Daily  . potassium chloride SA  20 mEq Oral BID      LOS: 1 day   Ripudeep Krystal Eaton M.D. [facility] 11/12/2013, 1:08 PM Pager: [phone]  If 7PM-7AM, please contact night-coverage [email or web address] Password [email or web address]  **Disclaimer: This note was dictated with voice recognition software. Similar sounding words can inadvertently be transcribed and this note may  contain transcription errors which may not have been corrected upon publication of note.**

## 2013-11-12 NOTE — Telephone Encounter (Signed)
, °

## 2013-11-12 NOTE — Progress Notes (Signed)
Lexiscan Cardiolite performed, [facility] to read.

## 2013-11-12 NOTE — Consult Note (Signed)
Subjective:   HPI  The patient is a 58 year old female who we are asked to see in regards to chest pain. Patient states that she has been having intermittent chest pain for the past couple of weeks. She came into the hospital with this pain and underwent a cardiac stress test. It was felt that her chest pain was not related to cardiac issues. She describes the pain as a sharp pain sometimes in a heavy pain other times. Nothing specifically causes it to occur that she can think of. In January of this year she was seen in consultation by Dr. Cristina Gong and then endoscoped by Dr. Amedeo Plenty. She was found to have scattered ulcerations in the esophagus and biopsies were consistent with acute inflammation. There was no evidence of fungus or herpes. She was sent home on pantoprazole and Carafate and gradually improved. At that time she was having more of an odynophagia problem. She does have a history of gastroesophageal reflux.  Review of Systems As above  Past Medical History  Diagnosis Date  . Hyperlipidemia   . Hypertension   . Bipolar 1 disorder   . Cancer   . Hypertension 04/04/2013  . Hyperlipidemia 04/04/2013  . Bipolar 1 disorder 04/04/2013  . Breast cancer 03/10/13    left breast invasive ca  . Allergy     latex  . Obesity   . Diabetes mellitus without complication     NIDDM  . Sleep apnea     no cpap due to insurance  . Heart murmur   . GERD (gastroesophageal reflux disease)   . Coronary artery disease    Past Surgical History  Procedure Laterality Date  . Cholecystectomy    . Breast biopsy Left 05/08/09    fibroadenoma with microcalcifications,no malignancy id  . Breast biopsy Left 03/07/13    invasive ca,1 lymph node metastatic  . 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 Abdominal hysterectomy    . Coronary stent placement  2001  . Cardiac catheterization    . Eye surgery  6/13,9/14    laser,cataract lft  . Coronary angioplasty       LAD stent 2003  . Portacath placement Right 04/22/2013    Procedure: INSERTION PORT-A-CATH;  Surgeon: Adin Hector, MD;  Location: [facility];  Service: General;  Laterality: Right;  . Mastectomy modified radical Left 04/22/2013    Procedure: MASTECTOMY MODIFIED RADICAL;  Surgeon: Adin Hector, MD;  Location: [facility];  Service: General;  Laterality: Left;  . Breast lumpectomy with needle localization and axillary sentinel lymph node bx Right 04/22/2013    Procedure: BREAST LUMPECTOMY WITH NEEDLE LOCALIZATION AND AXILLARY SENTINEL LYMPH NODE BX;  Surgeon: Adin Hector, MD;  Location: [facility];  Service: General;  Laterality: Right;  . Esophagogastroduodenoscopy N/A 08/07/2013    Procedure: ESOPHAGOGASTRODUODENOSCOPY (EGD);  Surgeon: Missy Sabins, MD;  Location: [facility];  Service: Endoscopy;  Laterality: N/A;   History   Social History  . Marital Status: Single    Spouse Name: N/A    Number of Children: 0  . Years of Education: N/A   Occupational History  . DISABLED    Social History Main Topics  . Smoking status: Former Smoker -- 1.00 packs/day for 15 years    Types: Cigarettes    Quit date: 07/19/1987  . Smokeless tobacco: Never Used  . Alcohol Use: No  . Drug Use: No  . Sexual Activity: No   Other Topics Concern  .  Not on file   Social History Narrative  . No narrative on file   family history includes Brain cancer (age of onset: 29) in her cousin; Breast cancer in her cousin and maternal aunt; Cancer in her maternal aunt and paternal aunt; Heart attack (age of onset: 13) in her sister; Heart disease in her mother; Heart disease (age of onset: 9) in her cousin; Lung cancer (age of onset: 26) in her cousin; Lung cancer (age of onset: 2) in her cousin. Current facility-administered medications:amLODipine (NORVASC) tablet 10 mg, 10 mg, Oral, Daily, Kelvin Cellar, MD, 10 mg at 11/12/13 0949;  aspirin EC tablet 325 mg, 325 mg, Oral, Daily, Kelvin Cellar, MD, 325 mg at  11/12/13 0949;  atorvastatin (LIPITOR) tablet 10 mg, 10 mg, Oral, q1800, Kelvin Cellar, MD, 10 mg at 11/12/13 1645;  buPROPion (WELLBUTRIN XL) 24 hr tablet 150 mg, 150 mg, Oral, Daily, Kelvin Cellar, MD, 150 mg at 11/12/13 0949 ciprofloxacin (CIPRO) tablet 500 mg, 500 mg, Oral, BID, Kelvin Cellar, MD, 500 mg at 11/12/13 0949;  enoxaparin (LOVENOX) injection 40 mg, 40 mg, Subcutaneous, Q24H, Kelvin Cellar, MD, 40 mg at 11/12/13 1645;  furosemide (LASIX) tablet 40 mg, 40 mg, Oral, Daily, Kelvin Cellar, MD, 40 mg at 11/12/13 1412;  gabapentin (NEURONTIN) capsule 300 mg, 300 mg, Oral, TID, Kelvin Cellar, MD, 300 mg at 11/12/13 1645 gi cocktail (Maalox,Lidocaine,Donnatal), 30 mL, Oral, QID PRN, Kelvin Cellar, MD;  hydrALAZINE (APRESOLINE) tablet 25 mg, 25 mg, Oral, BID, Kelvin Cellar, MD, 25 mg at 11/12/13 0949;  insulin aspart (novoLOG) injection 0-15 Units, 0-15 Units, Subcutaneous, TID WC, Kelvin Cellar, MD, 3 Units at 11/12/13 1700;  insulin aspart (novoLOG) injection 0-5 Units, 0-5 Units, Subcutaneous, QHS, Kelvin Cellar, MD linagliptin (TRADJENTA) tablet 5 mg, 5 mg, Oral, Daily, Kelvin Cellar, MD, 5 mg at 11/12/13 0948;  LORazepam (ATIVAN) tablet 0.5 mg, 0.5 mg, Oral, Q6H PRN, Kelvin Cellar, MD;  losartan (COZAAR) tablet 100 mg, 100 mg, Oral, Daily, Kelvin Cellar, MD, 100 mg at 11/12/13 0948;  metoprolol succinate (TOPROL-XL) 24 hr tablet 50 mg, 50 mg, Oral, Daily, Kelvin Cellar, MD, 50 mg at 11/12/13 1413 morphine 2 MG/ML injection 2 mg, 2 mg, Intravenous, Q2H PRN, Kelvin Cellar, MD, 2 mg at 11/11/13 1541;  pantoprazole (PROTONIX) EC tablet 40 mg, 40 mg, Oral, Daily, Kelvin Cellar, MD, 40 mg at 11/12/13 0949;  potassium chloride SA (K-DUR,KLOR-CON) CR tablet 20 mEq, 20 mEq, Oral, BID, Kelvin Cellar, MD, 20 mEq at 11/12/13 1610 Facility-Administered Medications Ordered in Other Encounters: sodium chloride 0.9 % injection 10 mL, 10 mL, Intravenous, PRN, [name], MD, 10  mL at 05/24/13 1522 Allergies  Allergen Reactions  . Cephalexin Other (See Comments)    Burned throat.  . Adhesive [Tape] Rash    Clear tape gloves ,elastic no problem     Objective:     BP 115/65  Pulse 92  Temp(Src) 98.4 F (36.9 C) (Oral)  Resp 16  Ht $R'5\' 2"'oS$  (1.575 m)  Wt 88.406 kg (194 lb 14.4 oz)  BMI 35.64 kg/m2  SpO2 94%  She is in no distress  Nonicteric  Heart regular rhythm no murmurs  Lungs clear  Abdomen: Bowel sounds normal, soft, nontender  Laboratory No components found with this basename: d1      Assessment:     #1. Noncardiac chest pain  #2. History of esophagitis      Plan:     I will proceed with another EGD tomorrow to reevaluate the esophagus and  see if there is anything in the esophagus to explain her current pain. She may benefit from the addition of Carafate liquid 4 times a day to her medication regimen.    Component Value Date/Time   WBC 3.5* 11/11/2013 1800   WBC 7.0 11/08/2013 1023   HGB 10.7* 11/11/2013 1800   HGB 11.4* 11/08/2013 1023   HCT 33.0* 11/11/2013 1800   HCT 34.7* 11/08/2013 1023   PLT 284 11/11/2013 1800   PLT 337 11/08/2013 1023   ALT 10 11/11/2013 0910   ALT 9 11/08/2013 1023   AST 16 11/11/2013 0910   AST 14 11/08/2013 1023   NA 137 11/11/2013 0910   NA 138 11/08/2013 1023   K 3.6* 11/11/2013 0910   K 3.2* 11/08/2013 1023   CL 99 11/11/2013 0910   CREATININE 0.70 11/11/2013 1800   CREATININE 0.9 11/08/2013 1023   BUN 6 11/11/2013 0910   BUN 6.5* 11/08/2013 1023   CO2 24 11/11/2013 0910   CO2 24 11/08/2013 1023   CALCIUM 9.0 11/11/2013 0910   CALCIUM 9.6 11/08/2013 1023   ALKPHOS 87 11/11/2013 0910   ALKPHOS 102 11/08/2013 1023

## 2013-11-12 NOTE — Progress Notes (Signed)
UR Completed.  Javaya [place] Jane Raj Landress [phone] 11/12/2013  

## 2013-11-13 ENCOUNTER — Encounter (HOSPITAL_COMMUNITY)
Admission: EM | Disposition: A | Discharge status: Home / Self Care | Payer: Self-pay | Source: Home / Self Care | Attending: Emergency Medicine

## 2013-11-13 ENCOUNTER — Ambulatory Visit (HOSPITAL_COMMUNITY): Payer: Medicare HMO | Admitting: Anesthesiology

## 2013-11-13 ENCOUNTER — Observation Stay (HOSPITAL_COMMUNITY): Payer: Medicare HMO | Admitting: Anesthesiology

## 2013-11-13 ENCOUNTER — Encounter (HOSPITAL_COMMUNITY): Payer: Self-pay | Admitting: Anesthesiology

## 2013-11-13 HISTORY — PX: ESOPHAGOGASTRODUODENOSCOPY: SHX5428

## 2013-11-13 LAB — URINE CULTURE

## 2013-11-13 LAB — GLUCOSE, CAPILLARY
GLUCOSE-CAPILLARY: 194 mg/dL — AB (ref 70–99)
Glucose-Capillary: 199 mg/dL — ABNORMAL HIGH (ref 70–99)

## 2013-11-13 SURGERY — EGD (ESOPHAGOGASTRODUODENOSCOPY)
Anesthesia: Monitor Anesthesia Care

## 2013-11-13 MED ORDER — FENTANYL CITRATE 0.05 MG/ML IJ SOLN
INTRAMUSCULAR | Status: DC | PRN
  Administered 2013-11-13: 50 ug via INTRAVENOUS

## 2013-11-13 MED ORDER — HEPARIN SOD (PORK) LOCK FLUSH 100 UNIT/ML IV SOLN
500.0000 [IU] | INTRAVENOUS | Status: AC | PRN
  Administered 2013-11-13: 500 [IU]

## 2013-11-13 MED ORDER — SODIUM CHLORIDE 0.9 % IJ SOLN
10.0000 mL | INTRAMUSCULAR | Status: DC | PRN
  Administered 2013-11-13: 10 mL
  Administered 2013-11-13: 20 mL

## 2013-11-13 MED ORDER — SODIUM CHLORIDE 0.9 % IV SOLN: INTRAVENOUS | Status: DC

## 2013-11-13 MED ORDER — PROMETHAZINE HCL 25 MG/ML IJ SOLN: 6.2500 mg | INTRAMUSCULAR | Status: DC | PRN

## 2013-11-13 MED ORDER — LACTATED RINGERS IV SOLN
INTRAVENOUS | Status: DC | PRN
  Administered 2013-11-13: 09:00:00 via INTRAVENOUS

## 2013-11-13 MED ORDER — PROPOFOL INFUSION 10 MG/ML OPTIME
INTRAVENOUS | Status: DC | PRN
  Administered 2013-11-13: 100 ug/kg/min via INTRAVENOUS

## 2013-11-13 MED ORDER — BUTAMBEN-TETRACAINE-BENZOCAINE 2-2-14 % EX AERO
INHALATION_SPRAY | CUTANEOUS | Status: DC | PRN
  Administered 2013-11-13: 2 via TOPICAL

## 2013-11-13 MED ORDER — OXYCODONE HCL 5 MG/5ML PO SOLN: 5.0000 mg | Freq: Once | ORAL | Status: DC | PRN

## 2013-11-13 MED ORDER — OXYCODONE HCL 5 MG PO TABS: 5.0000 mg | ORAL_TABLET | Freq: Once | ORAL | Status: DC | PRN

## 2013-11-13 MED ORDER — FENTANYL CITRATE 0.05 MG/ML IJ SOLN: 25.0000 ug | INTRAMUSCULAR | Status: DC | PRN

## 2013-11-13 MED ORDER — MIDAZOLAM HCL 5 MG/5ML IJ SOLN
INTRAMUSCULAR | Status: DC | PRN
  Administered 2013-11-13: 2 mg via INTRAVENOUS

## 2013-11-13 MED ORDER — CIPROFLOXACIN HCL 500 MG PO TABS: 500.0000 mg | ORAL_TABLET | Freq: Two times a day (BID) | ORAL | Status: DC

## 2013-11-13 MED ORDER — LACTATED RINGERS IV SOLN
INTRAVENOUS | Status: DC
  Administered 2013-11-13: 1000 mL via INTRAVENOUS

## 2013-11-13 NOTE — Discharge Summary (Signed)
Physician Discharge Summary  Kathy Maxwell GEX:[id number] DOB: 1956/03/13 DOA: 11/11/2013  PCP: Maximino Greenland, MD  Admit date: 11/11/2013 Discharge date: 11/13/2013  Time spent: 35 minutes  Recommendations for Outpatient Follow-up:  Follow up with PCP for chronic medical managements.   Discharge Diagnoses:    Chest pain   UTI.    Breast cancer of upper-outer quadrant of left female breast   Hypertension   Hyperlipidemia   Bipolar 1 disorder   Chronic diastolic CHF (congestive heart failure)    Discharge Condition: Stable.   Diet recommendation: Carb modified.   Filed Weights   11/11/13 1628 11/13/13 0445 11/13/13 0833  Weight: 88.406 kg (194 lb 14.4 oz) 88.455 kg (195 lb 0.1 oz) 88.451 kg (195 lb)    History of present illness:  Kathy Maxwell is a 58 y.o. 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 Patient reports having chest pain last Friday located in the retrosternal region which spontaneously resolved. She woke up this morning at approximately 3 AM with chest pain again located in the retrosternal region, characterized as tight/pressure like, with radiation to her left shoulder. Her chest pain has remained constant over the course of the morning, growing concern for which she presented to the emergency room. She states accompanying dizziness, mild shortness of breath, denies cough, sputum production, nausea, vomiting, syncope, presyncope, palpitations or recurrent falls. She reports compliance to all of her medications. In the emergency department EKG did not reveal acute ischemic changes. Troponin was within normal limits. Chest x-ray showed cardiomegaly with vascular congestion.    Hospital Course:  Chest pain:  Risk  factors include hypertension, diabetes mellitus, prior history of coronary artery disease, LAD  stent 2003. Patient ruled out for acute ACS. Cardiology consulted, seen by Dr. Debara Pickett. Patient was recommended nuclear medicine stress test.  Troponins negative. EKG did not reveal acute ischemic changes. CT angiogram scan of the chest did not show any acute pulmonary embolism. Stress test negative. Patient underwent endoscopy due to persistent chest pain. Endoscopy negative. Patient will be discharge on protonix. Patient denies chest pain today.   UTI  - Urine culture more than 100,000 colonies of gram-negative rods  - Placed on oral ciprofloxacin, follow culture results.   Breast cancer of upper-outer quadrant of left female breast  Followup patient with Dr. Humphrey Rolls   Hypertension  Currently stable, increased metoprolol to 50 mg, continue amlodipine, losartan and hydralazine.   Hyperlipidemia LDL 81, cholesterol 159   Bipolar 1 disorder stable   Chronic diastolic CHF (congestive heart failure)- stable and compensated   Procedures: Myoview: No reversible ischemia. EF 62%. Normal regional wall motion.  Endoscopy; normal.   Consultations:  Cardiology  GI.   Discharge Exam: Filed Vitals:   11/13/13 1030  BP: 156/68  Pulse: 81  Temp: 97.4 F (36.3 C)  Resp: 18    General: no distress.  Cardiovascular: S 1, S 2 RRR Respiratory: CTA  Discharge Instructions You were cared for by a hospitalist during your hospital stay. If you have any questions about your discharge medications or the care you received while you were in the hospital after you are discharged, you can call the unit and asked to speak with the hospitalist on call if the hospitalist that took care of you is not available. Once you are discharged, your primary care physician will handle any  further medical issues. Please note that NO REFILLS for any discharge medications will be authorized once you are discharged, as it is imperative that you return to your primary care physician (or establish a relationship with a  primary care physician if you do not have one) for your aftercare needs so that they can reassess your need for medications and monitor your lab values.  Discharge Orders   Future Appointments Provider Department Dept Phone   11/14/2013 8:45 AM [facility] [phone]   11/22/2013 1:15 PM [facility] [phone]   11/22/2013 1:45 PM Minette Headland, NP [facility] [phone]   11/22/2013 2:45 PM [facility] [phone]   12/06/2013 1:15 PM [facility] [phone]   12/06/2013 1:45 PM Minette Headland, NP [facility] [phone]   12/06/2013 2:30 PM [facility] [phone]   Future Orders Complete By Expires   Diet Carb Modified  As directed    Increase activity slowly  As directed        Medication List    STOP taking these medications       levofloxacin 500 MG tablet  Commonly known as:  LEVAQUIN      TAKE these medications       amLODipine 10 MG tablet  Commonly known as:  NORVASC  Take 10 mg by mouth daily.     b complex vitamins tablet  Take 1 tablet by mouth daily.     buPROPion 150 MG 12 hr tablet  Commonly known as:  WELLBUTRIN SR  Take 1 tablet (150 mg total) by mouth daily.     ciprofloxacin 500 MG tablet  Commonly known as:  CIPRO  Take 1 tablet (500 mg total) by mouth 2 (two) times daily.     fluticasone 50 MCG/ACT nasal spray  Commonly known as:  FLONASE  Place 1 spray into both nostrils daily.     furosemide 40 MG tablet  Commonly known as:  LASIX  Take 1 tablet (40 mg total) by mouth daily.     gabapentin 100 MG capsule  Commonly known as:  NEURONTIN  Take 3 capsules (300 mg total) by mouth 3 (three) times daily.     hydrALAZINE 25 MG tablet  Commonly  known as:  APRESOLINE  Take 25 mg by mouth 2 (two) times daily.     HYDROcodone-acetaminophen 5-325 MG per tablet  Commonly known as:  NORCO/VICODIN  Take 1-2 tablets by mouth every 4 (four) hours as needed for moderate pain.     JANUVIA 100 MG tablet  Generic drug:  sitaGLIPtin  Take 100 mg by mouth daily.     lidocaine-prilocaine cream  Commonly known as:  EMLA  Apply topically as needed. Apply to port-a-cath one - two hours before accessing.     LORazepam 0.5 MG tablet  Commonly known as:  ATIVAN  Take 1 tablet (0.$RemoveBef'5mg'bmLaGIgTsZ$ ) every 6 hours as needed for nausea or vomiting.     losartan 100 MG tablet  Commonly known as:  COZAAR  Take 100 mg by mouth daily.     metoprolol succinate 25 MG 24 hr tablet  Commonly known as:  TOPROL-XL  Take 25 mg by mouth daily.     ondansetron 8 MG tablet  Commonly known as:  ZOFRAN  Take  1 tablet ($RemoveB'8mg'zDsMsaMm$ s) every 12 hours as needed for nausea or vomiting starting the 3rd day after chemotherapy.     pantoprazole 40 MG tablet  Commonly known as:  PROTONIX  Take 1 tablet (40 mg total) by mouth daily.     pioglitazone-metformin 15-850 MG per tablet  Commonly known as:  ACTOPLUS MET  Take 1 tablet by mouth every morning.     potassium chloride SA 20 MEQ tablet  Commonly known as:  K-DUR,KLOR-CON  Take 1 tablet (20 mEq total) by mouth 2 (two) times daily.     rosuvastatin 5 MG tablet  Commonly known as:  CRESTOR  Take 5 mg by mouth daily.     VITAMIN D PO  Take 1 tablet by mouth daily.       Allergies  Allergen Reactions  . Cephalexin Other (See Comments)    Burned throat.  . Adhesive [Tape] Rash    Clear tape gloves ,elastic no problem       Follow-up Information   Follow up with Maximino Greenland, MD In 1 week.   Specialty:  Internal Medicine   Contact information:   [address] [place] [address] [phone]        The results of significant diagnostics from this hospitalization (including imaging,  microbiology, ancillary and laboratory) are listed below for reference.    Significant Diagnostic Studies: Dg Chest 2 View  11/11/2013   CLINICAL DATA:  Shortness of breath, cough, substernal chest pain  EXAM: CHEST  2 VIEW  COMPARISON:  11/04/2013  FINDINGS: Right IJ power port catheter tip mid right atrium. Mild cardiomegaly with vascular congestion and basilar atelectasis. No definite focal pneumonia, collapse or consolidation. No edema, effusion or pneumothorax. Trachea midline. Prior left mastectomy, axillary lymph node dissection, and cholecystectomy.  IMPRESSION: Cardiomegaly with vascular congestion and basilar atelectasis.   Electronically Signed   By: Daryll Brod M.D.   On: 11/11/2013 10:24   Dg Chest 2 View  11/04/2013   CLINICAL DATA:  Cough, congestion  EXAM: CHEST  2 VIEW  COMPARISON:  DG CHEST 2 VIEW dated 08/06/2013  FINDINGS: There is a right-sided Port-A-Cath with the tip at the cavoatrial junction. There is no focal parenchymal opacity, pleural effusion, or pneumothorax. The heart and mediastinal contours are unremarkable.  There are surgical clips in the left axilla.  The osseous structures are unremarkable.  IMPRESSION: No active cardiopulmonary disease.   Electronically Signed   By: Kathreen Devoid   On: 11/04/2013 11:39   Ct Angio Chest Pe W/cm &/or Wo Cm  11/11/2013   CLINICAL DATA:  Chest pain.  EXAM: CT ANGIOGRAPHY CHEST WITH CONTRAST  TECHNIQUE: Multidetector CT imaging of the chest was performed using the standard protocol during bolus administration of intravenous contrast. Multiplanar CT image reconstructions and MIPs were obtained to evaluate the vascular anatomy.  CONTRAST:  155mL OMNIPAQUE IOHEXOL 350 MG/ML SOLN  COMPARISON:  Prior PET CT 04/29/2013.  Chest CT 04/17/2013.  FINDINGS: No filling defects in the pulmonary arteries to suggest pulmonary emboli. There is mild cardiomegaly. Aorta is normal caliber. No pleural or pericardial effusion. The mid to distal left anterior  descending coronary artery is heavily calcified. This is a stable finding. No mediastinal, hilar, or axillary adenopathy.  Right Port-A-Cath is in place. This is unchanged since prior PET CT. Surgical clips in the left axilla. Chest wall soft tissues grossly unremarkable.  Review of the lung windows demonstrates scattered ground-glass opacities, most in the lower lobes. This  may reflect dependent atelectasis or less likely early edema.  No acute bony abnormality or focal bone lesion.  Imaging into the upper abdomen shows no acute findings.  Review of the MIP images confirms the above findings.  IMPRESSION: No evidence of pulmonary embolus.  Mild cardiomegaly. Scattered lower lobe ground-glass opacities may reflect dependent atelectasis or less likely early edema.  Coronary artery disease.   Electronically Signed   By: Rolm Baptise M.D.   On: 11/11/2013 11:45   Nm Myocar Multi W/spect W/wall Motion / Ef  11/12/2013   HISTORY OF PRESENT ILLNESS: Nuclear Med Background  Indication for Stress Test:  Chest pain  History: 58 y.o. 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 Patient reports having chest pain last Friday located in the retrosternal region which spontaneously resolved. She woke up this morning at approximately 3 AM with chest pain again located in the retrosternal region, characterized as tight/pressure like, with radiation to her left shoulder. Her chest pain has remained constant over the course of the morning, growing concern for which she presented to the emergency room. She states accompanying dizziness, mild shortness of breath, denies cough, sputum production, nausea, vomiting, syncope, presyncope, palpitations or recurrent falls. She reports compliance to all of her medications. In the emergency department EKG did not reveal acute  ischemic changes. Troponin was within normal limits. Chest x-ray showed cardiomegaly with vascular congestion.  Cardiac Risk Factors:  Prior CAD, LAD stent, HTN, DM2, HPL  Symptoms:  Chest pain  PROCEDURE: Nuclear Pre-Procedure  Caffeine/Decaff Intake:  NPO After: Midnight.  Lungs:  O2 Stat:  IV 0.9% NS with Angio Cath:  Chest Size (in):  Cup Size: 4  Height: 5'2"  Weight:  194 lbs  BMI:  Tech Comments:  Nuclear Med Study  1 or 2 day study: 1  Stress Test Type: Lexiscan  Reading MD: Hilty  Order Authorizing Provider: Hilty  Resting Radionuclide: Sestamibi  Resting Radionuclide Dose:  10 mCi  Stress Radionuclide: Sestamibi  Stress Radionuclide Dose:  10 mCi  Stress Protocol  Rest HR: 96  Stress HR: 86  Rest BP: 139/55  Stress BP: 157/72  Exercise Time (min):  METS:  Dose of Adenosine (mg):  Dose of Lexiscan:  0.4 mg  Dose of Atropine (mg):  Dose of Dobutamine:  Stress Test Technologist:  Nuclear Technologist:  Rest Procedure:  Stress Procedure:  Transient Ischemic Dilatation (Normal <1.22): 1.00  Lung/Heart Ratio (Normal <0.45): 0.37  QGS EDV:  88 ml  QGS ESV:  33 ml  LV Ejection Fraction: 62%  Rest ECG:  Sinus rhythm  Stress ECG: Unchanged  Raw Data Images:  Stress Images:  Rest Images:  Subtraction (SDS): 0  IMPRESSION: Exercise Capacity: N/A  BP Response: normal  Clinical Symptoms: none  ECG Impression:  No lexiscan EKG changes  Comparison with Prior Nuclear Study: none  Final Impression:  No reversible ischemia.  EF 62%.  Normal regional wall motion.   Electronically Signed   By: Pixie Casino   On: 11/12/2013 15:16    Microbiology: Recent Results (from the past 240 hour(s))  URINE CULTURE     Status: None   Collection Time    11/11/13 10:54 AM      Result Value Ref Range Status   Specimen Description URINE, CLEAN CATCH   Final   Special Requests NONE  Final   Culture  Setup Time     Final   Value: 11/11/2013 16:55     Performed at [facility]     Final   Value: >=100,000  COLONIES/ML     Performed at [facility]   Culture     Final   Value: KLEBSIELLA PNEUMONIAE     Performed at [facility]   Report Status 11/13/2013 FINAL   Final   Organism ID, Bacteria KLEBSIELLA PNEUMONIAE   Final     Labs: Basic Metabolic Panel:  Recent Labs Lab 11/08/13 1023 11/11/13 0910 11/11/13 1800  NA 138 137  --   K 3.2* 3.6*  --   CL  --  99  --   CO2 24 24  --   GLUCOSE 298* 250*  --   BUN 6.5* 6  --   CREATININE 0.9 0.64 0.70  CALCIUM 9.6 9.0  --    Liver Function Tests:  Recent Labs Lab 11/08/13 1023 11/11/13 0910  AST 14 16  ALT 9 10  ALKPHOS 102 87  BILITOT 0.53 0.5  PROT 6.7 6.6  ALBUMIN 3.4* 3.3*   No results found for this basename: LIPASE, AMYLASE,  in the last 168 hours No results found for this basename: AMMONIA,  in the last 168 hours CBC:  Recent Labs Lab 11/08/13 1023 11/11/13 0910 11/11/13 1800  WBC 7.0 3.4* 3.5*  NEUTROABS 4.9 2.2  --   HGB 11.4* 10.5* 10.7*  HCT 34.7* 32.0* 33.0*  MCV 91.6 90.9 91.2  PLT 337 274 284   Cardiac Enzymes:  Recent Labs Lab 11/11/13 0910 11/11/13 1534 11/11/13 1800 11/11/13 2250  TROPONINI <0.30 <0.30 <0.30 <0.30   BNP: BNP (last 3 results) No results found for this basename: PROBNP,  in the last 8760 hours CBG:  Recent Labs Lab 11/12/13 1348 11/12/13 1626 11/12/13 2138 11/13/13 0719 11/13/13 1129  GLUCAP 207* 185* 167* 199* 194*       Signed:  Carlisa Eble A Vaden Becherer  [facility] 11/13/2013, 1:18 PM

## 2013-11-13 NOTE — Transfer of Care (Signed)
Immediate Anesthesia Transfer of Care Note  Patient: Kathy Maxwell  Procedure(s) Performed: Procedure(s): ESOPHAGOGASTRODUODENOSCOPY (EGD) (N/A)  Patient Location: PACU  Anesthesia Type:MAC  Level of Consciousness: awake, alert , oriented and patient cooperative  Airway & Oxygen Therapy: Patient Spontanous Breathing and Patient connected to face mask oxygen  Post-op Assessment: Report given to PACU RN and Post -op Vital signs reviewed and stable  Post vital signs: Reviewed and stable  Complications: No apparent anesthesia complications

## 2013-11-13 NOTE — Op Note (Signed)
[facility] [place], [id number]   ENDOSCOPY PROCEDURE REPORT  PATIENT: Kathy Maxwell, Kathy Maxwell  MR#: [id number] BIRTHDATE: 08/08/1955 , 61  yrs. old GENDER: Female ENDOSCOPIST: Acquanetta Sit, MD REFERRED BY: PROCEDURE DATE:  11/13/2013 PROCEDURE:   EGD ASA CLASS: 3 INDICATIONS: chest pain, prior history of esophagitis MEDICATIONS: propofol per anesthesia TOPICAL ANESTHETIC:  DESCRIPTION OF PROCEDURE:   After the risks benefits and alternatives of the procedure were thoroughly explained, informed consent was obtained.  The Pentax Gastroscope [id number]  endoscope was introduced through the mouth and advanced to the second portion of the duodenum      , limited by Without limitations.   The instrument was slowly withdrawn as the mucosa was fully examined.      FINDINGS:  Esophagus: Normal no evidence of esophagitis or ulcerations or any abnormal lesions seen  Stomach: Normal  Duodenum: Normal    COMPLICATIONS:non-  ENDOSCOPIC IMPRESSION:normal EGD   RECOMMENDATIONS:there is nothing on this examination to explain chest pain. I would recommend continuing PPI therapy for her history of gastroesophageal reflux      _______________________________ Carrie Mew, MD 11/13/2013 9:32 AM       PATIENT NAME:  Kathy Maxwell, Kathy Maxwell MR#: [id number]

## 2013-11-13 NOTE — Progress Notes (Signed)
Nuclear stress test showed no ischemia and normal LVF.

## 2013-11-13 NOTE — Anesthesia Postprocedure Evaluation (Signed)
  Anesthesia Post-op Note  Patient: Kathy Maxwell  Procedure(s) Performed: Procedure(s): ESOPHAGOGASTRODUODENOSCOPY (EGD) (N/A)  Patient Location: PACU and Endoscopy Unit  Anesthesia Type:MAC  Level of Consciousness: awake and alert   Airway and Oxygen Therapy: Patient Spontanous Breathing  Post-op Pain: none  Post-op Assessment: Post-op Vital signs reviewed  Post-op Vital Signs: stable  Last Vitals:  Filed Vitals:   11/13/13 1030  BP: 156/68  Pulse: 81  Temp: 36.3 C  Resp: 18    Complications: No apparent anesthesia complications

## 2013-11-13 NOTE — Anesthesia Preprocedure Evaluation (Addendum)
Anesthesia Evaluation  Patient identified by MRN, date of birth, ID band Patient awake    Reviewed: Allergy & Precautions, H&P , NPO status , Patient's Chart, lab work & pertinent test results  Airway Mallampati: I TM Distance: >3 FB Neck ROM: Full    Dental  (+) Teeth Intact, Missing, Dental Advisory Given,    Pulmonary sleep apnea , former smoker,  breath sounds clear to auscultation        Cardiovascular hypertension, Pt. on home beta blockers and Pt. on medications + CAD, + Cardiac Stents and +CHF + Valvular Problems/Murmurs Rhythm:Regular Rate:Normal     Neuro/Psych PSYCHIATRIC DISORDERS Bipolar Disorder    GI/Hepatic Neg liver ROS, GERD-  Medicated and Controlled,  Endo/Other  diabetes, Oral Hypoglycemic Agents  Renal/GU negative Renal ROS     Musculoskeletal   Abdominal (+) + obese,   Peds  Hematology negative hematology ROS (+)   Anesthesia Other Findings   Reproductive/Obstetrics                         Anesthesia Physical Anesthesia Plan  ASA: III  Anesthesia Plan: MAC   Post-op Pain Management:    Induction: Intravenous  Airway Management Planned: Nasal Cannula  Additional Equipment:   Intra-op Plan:   Post-operative Plan: Extubation in OR  Informed Consent: I have reviewed the patients History and Physical, chart, labs and discussed the procedure including the risks, benefits and alternatives for the proposed anesthesia with the patient or authorized representative who has indicated his/her understanding and acceptance.     Plan Discussed with: CRNA and Surgeon  Anesthesia Plan Comments:         Anesthesia Quick Evaluation

## 2013-11-14 ENCOUNTER — Ambulatory Visit: Payer: Medicare HMO

## 2013-11-14 ENCOUNTER — Encounter (HOSPITAL_COMMUNITY): Payer: Self-pay | Admitting: Gastroenterology

## 2013-11-14 NOTE — Progress Notes (Signed)
Spoke with pt in the lobby today.  Pt was discharged from the hospital on 11/13/13.  Informed pt that no treatment today as per Ria Comment, NP.  Pt was directed to scheduler for a new appt calendar. Pt to have lab, office visit, and chemo  On  11/22/13.  Reinforced with pt that she needs to follow up with her primary for increased blood pressure issue.  Pt voiced understanding and stated she would call her primary today.

## 2013-11-15 ENCOUNTER — Other Ambulatory Visit: Payer: Medicare HMO

## 2013-11-15 ENCOUNTER — Ambulatory Visit: Payer: Medicare HMO

## 2013-11-15 ENCOUNTER — Ambulatory Visit: Payer: Medicare HMO | Admitting: Adult Health

## 2013-11-19 NOTE — Progress Notes (Signed)
Report rcvd [employer] re: possible non-compliance with Losartan.  Reviewed by [facility].  Sent to scan

## 2013-11-22 ENCOUNTER — Ambulatory Visit (HOSPITAL_BASED_OUTPATIENT_CLINIC_OR_DEPARTMENT_OTHER): Payer: Medicare HMO

## 2013-11-22 ENCOUNTER — Ambulatory Visit (HOSPITAL_BASED_OUTPATIENT_CLINIC_OR_DEPARTMENT_OTHER): Payer: Medicare HMO | Admitting: Adult Health

## 2013-11-22 ENCOUNTER — Other Ambulatory Visit (HOSPITAL_BASED_OUTPATIENT_CLINIC_OR_DEPARTMENT_OTHER): Payer: Medicare HMO

## 2013-11-22 VITALS — BP 172/88 | HR 92 | Temp 98.2°F | Resp 18 | Ht 62.0 in | Wt 196.9 lb

## 2013-11-22 DIAGNOSIS — C773 Secondary and unspecified malignant neoplasm of axilla and upper limb lymph nodes: Secondary | ICD-10-CM

## 2013-11-22 DIAGNOSIS — C50412 Malignant neoplasm of upper-outer quadrant of left female breast: Secondary | ICD-10-CM

## 2013-11-22 DIAGNOSIS — G609 Hereditary and idiopathic neuropathy, unspecified: Secondary | ICD-10-CM

## 2013-11-22 DIAGNOSIS — Z5111 Encounter for antineoplastic chemotherapy: Secondary | ICD-10-CM

## 2013-11-22 DIAGNOSIS — D0590 Unspecified type of carcinoma in situ of unspecified breast: Secondary | ICD-10-CM

## 2013-11-22 DIAGNOSIS — Z170 Estrogen receptor positive status [ER+]: Secondary | ICD-10-CM

## 2013-11-22 DIAGNOSIS — I10 Essential (primary) hypertension: Secondary | ICD-10-CM

## 2013-11-22 DIAGNOSIS — C50419 Malignant neoplasm of upper-outer quadrant of unspecified female breast: Secondary | ICD-10-CM

## 2013-11-22 LAB — CBC WITH DIFFERENTIAL/PLATELET
BASO%: 0.7 % (ref 0.0–2.0)
Basophils Absolute: 0 10*3/uL (ref 0.0–0.1)
EOS%: 2.5 % (ref 0.0–7.0)
Eosinophils Absolute: 0.1 10*3/uL (ref 0.0–0.5)
HEMATOCRIT: 32 % — AB (ref 34.8–46.6)
HGB: 10.4 g/dL — ABNORMAL LOW (ref 11.6–15.9)
LYMPH#: 1.1 10*3/uL (ref 0.9–3.3)
LYMPH%: 20.1 % (ref 14.0–49.7)
MCH: 29.6 pg (ref 25.1–34.0)
MCHC: 32.6 g/dL (ref 31.5–36.0)
MCV: 90.9 fL (ref 79.5–101.0)
MONO#: 0.5 10*3/uL (ref 0.1–0.9)
MONO%: 9 % (ref 0.0–14.0)
NEUT#: 3.7 10*3/uL (ref 1.5–6.5)
NEUT%: 67.7 % (ref 38.4–76.8)
Platelets: 368 10*3/uL (ref 145–400)
RBC: 3.52 10*6/uL — AB (ref 3.70–5.45)
RDW: 16.1 % — AB (ref 11.2–14.5)
WBC: 5.4 10*3/uL (ref 3.9–10.3)

## 2013-11-22 LAB — COMPREHENSIVE METABOLIC PANEL (CC13)
ALT: 7 U/L (ref 0–55)
ANION GAP: 9 meq/L (ref 3–11)
AST: 14 U/L (ref 5–34)
Albumin: 3.6 g/dL (ref 3.5–5.0)
Alkaline Phosphatase: 96 U/L (ref 40–150)
BUN: 11.7 mg/dL (ref 7.0–26.0)
CALCIUM: 9.9 mg/dL (ref 8.4–10.4)
CHLORIDE: 105 meq/L (ref 98–109)
CO2: 25 meq/L (ref 22–29)
Creatinine: 0.9 mg/dL (ref 0.6–1.1)
Glucose: 180 mg/dl — ABNORMAL HIGH (ref 70–140)
POTASSIUM: 3.7 meq/L (ref 3.5–5.1)
SODIUM: 139 meq/L (ref 136–145)
TOTAL PROTEIN: 6.8 g/dL (ref 6.4–8.3)
Total Bilirubin: 0.46 mg/dL (ref 0.20–1.20)

## 2013-11-22 MED ORDER — SODIUM CHLORIDE 0.9 % IJ SOLN
10.0000 mL | INTRAMUSCULAR | Status: DC | PRN
  Administered 2013-11-22: 10 mL
  Filled 2013-11-22: qty 10

## 2013-11-22 MED ORDER — PACLITAXEL PROTEIN-BOUND CHEMO INJECTION 100 MG: 100.0000 mg/m2 | Freq: Once | INTRAVENOUS | Status: DC

## 2013-11-22 MED ORDER — SODIUM CHLORIDE 0.9 % IJ SOLN
10.0000 mL | INTRAMUSCULAR | Status: DC | PRN
  Filled 2013-11-22: qty 10

## 2013-11-22 MED ORDER — HEPARIN SOD (PORK) LOCK FLUSH 100 UNIT/ML IV SOLN
500.0000 [IU] | Freq: Once | INTRAVENOUS | Status: DC | PRN
  Filled 2013-11-22: qty 5

## 2013-11-22 MED ORDER — HEPARIN SOD (PORK) LOCK FLUSH 100 UNIT/ML IV SOLN
500.0000 [IU] | Freq: Once | INTRAVENOUS | Status: AC | PRN
  Administered 2013-11-22: 500 [IU]
  Filled 2013-11-22: qty 5

## 2013-11-22 MED ORDER — SODIUM CHLORIDE 0.9 % IV SOLN
Freq: Once | INTRAVENOUS | Status: AC
  Administered 2013-11-22: 15:00:00 via INTRAVENOUS

## 2013-11-22 MED ORDER — PACLITAXEL PROTEIN-BOUND CHEMO INJECTION 100 MG
100.0000 mg/m2 | Freq: Once | INTRAVENOUS | Status: AC
  Administered 2013-11-22: 200 mg via INTRAVENOUS
  Filled 2013-11-22: qty 40

## 2013-11-22 MED ORDER — SODIUM CHLORIDE 0.9 % IV SOLN: Freq: Once | INTRAVENOUS | Status: DC

## 2013-11-22 MED ORDER — ONDANSETRON 8 MG/NS 50 ML IVPB
INTRAVENOUS | Status: AC
Start: 2013-11-22 — End: 2013-11-22
  Filled 2013-11-22: qty 8

## 2013-11-22 MED ORDER — ONDANSETRON 8 MG/50ML IVPB (CHCC)
8.0000 mg | Freq: Once | INTRAVENOUS | Status: AC
  Administered 2013-11-22: 8 mg via INTRAVENOUS

## 2013-11-22 MED ORDER — ONDANSETRON 8 MG/50ML IVPB (CHCC): 8.0000 mg | Freq: Once | INTRAVENOUS | Status: DC

## 2013-11-22 NOTE — Progress Notes (Signed)
Hematology and Oncology Follow Up Visit  Kathy Maxwell [id number] 24-Jun-1956 58 y.o. 11/26/2013 10:37 PM     Principal Diagnosis:Kathy Maxwell 58 y.o. 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 Screening mammogram in 12/2012 was abnormal which led to left digital diagnostic mammogram and left breast ultrasound on 03/01/2013. Ultrasound of the left breast showed a hypoechoic mass at 12:30, 6 cm from the nipple, that measured 1.9 x 2.6 x 2.9 cm. Ultrasound of the left axilla demonstrates a 1.1 x 0.7 x 0.9 cm lymph node with a borderline thickened cortex.   2. Left breast needle core biopsy at the 12:30 o'clock position and left needle core biopsy of the left axillary lymph node on 03/07/2013 showed invasive mammary carcinoma with features compatible with a grade 1 or 2 ductal carcinoma in the breast needle core biopsy and lymph node was positive for metastatic mammary carcinoma with a macrometastasis and extracapsular extension identified. Estrogen receptor 100% positive, progesterone receptor 75% positive, Ki-67 15%, HER-2/neu by CISH no amplification.   3. Bilateral breast MRI on 03/15/2013 showed in the left breast there were at least 4 focus of abnormal spiculated enhancement in the upper left breast centrally. The entire area measured 7.4 cm x 6.6 cm x 5.1 cm. The previous biopsy focus was posteriorly located and measured 2.1 x 1 x 2.1 cm. The anterior focus that was not biopsied measured 3.1 x 2.6 x 2.6 cm. A lateral focus that was not biopsied measured 1.1 x 1.4 x 1.6 cm. There were small medial focus measuring cyst 0.7 x 0.8 x 0.7 cm Post biopsy changes were identified in the left axilla. There were abnormal enlarged lymph nodes correlating to the patient's known metastatic neoplasm to the lymph nodes which the largest lymph node measured 1.8 x 0.8 cm. In the right breast there was an irregular focus of 3.3  x 3.1 x 1.3 cm abnormal enhancement in the central right breast in the middle one third. There was a mild thickened cortex and prominent lymph nodes within the right axilla with the largest measuring 1.6 x 1 cm. There was a posterior central upper breast focus with associated clip artifact measuring 1.2 x 2.2 x 1 cm previously biopsied to be benign. Suspicious findings in bilateral breasts.   4. Anterior left breast needle core biopsy and central right breast needle core biopsy on 04/03/2013. Left breast needle core biopsy showed invasive ductal carcinoma and ductal carcinoma in situ, 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% positive, progesterone receptor 23% positive, Ki-67 33%, and HER-2/neu by CISH no amplification. Right breast needle core biopsy showed ductal carcinoma in situ, 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% positive, progesterone receptor 11% positive.   5. Status post left breast modified radical mastectomy with left axillary lymph node resection and right breast lumpectomy with right axillary node biopsy on 04/22/2013 with pathology results as follows:  Left breast: Stage IIIA, pT3, pN2a, invasive ductal carcinoma with multiple foci, grade 1, largest span was 5.5 cm and ductal carcinoma in situ low-grade, 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% positive, progesterone receptor 23% positive, HER-2/neu no amplification, Ki-67 33%, with 4/22 metastatic lymph nodes and 1 isolated tumor cells identified within left axillary lymph node resection. Extracapsular extension was present and surgical resection margins were negative for carcinoma.  Right breast: Stage 0, pTis, pN0, 3.0 cm ductal carcinoma in situ with calcifications, intermediate to high-grade, 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% positive, progesterone receptor 11% positive, with 0/4 metastatic right  axillary lymph nodes.  Reexcision of right breast medial margin: Ductal carcinoma in situ, intermediate grade spanning 0.8 cm, ductal carcinoma in situ is focally less than 0.1 cm from the new medial  margin, lobular neoplasia (atypical lobular hyperplasia).   6. 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 status post adjuvant chemotherapy consisting of FEC (5-FU/epirubicin/Cytoxan) completed 05/24/2013 - 07/23/13.   8. Status post 2 cycles of single agent Taxol administered from 08/16/2013 through 08/23/2013. 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 Receiving single agent Abraxane on 08/30/13.    Current therapy:  Abraxane cycle 3 day 15  Interim History: Kathy Maxwell 58 y.o. 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  She is receiving this in the adjuvant setting.    The patient is here today for evaluation prior to cycle 3 day 15 of treatment.  She was recently hospitalized due to chest pain.  She was ruled out for acute ACS.  CTA was negative for acute pulmonary embolism.  Urine culture was positive for Klebsiella and she was prescribed Cipro.  Her blood pressure was stable on Metoprolol, Amlodipine, Losartan, and Hydralazine.  Due to persistent chest pain she underwent an endoscopy that was negative and she was discharged on Protonix.    Today, the patient is doing well.  She has recovered her strength since her hospitalization and is feeling well today.  Her neuropathy remains stable.  She denies fevers, chills, nausea, vomiting, constipation, diarrhea, skin changes, or any further concerns.     Medications:  Current Outpatient Prescriptions  Medication Sig Dispense Refill  . amLODipine (NORVASC) 10 MG tablet Take 10 mg by mouth daily.      [name] b complex vitamins tablet Take 1 tablet by mouth daily.      [name] buPROPion (WELLBUTRIN SR) 150 MG 12 hr tablet Take 1 tablet (150 mg total) by mouth daily.  30 tablet  6  . Cholecalciferol (VITAMIN D PO) Take 1 tablet by mouth daily.      .  furosemide (LASIX) 40 MG tablet Take 1 tablet (40 mg total) by mouth daily.  30 tablet  0  . gabapentin (NEURONTIN) 100 MG capsule Take 3 capsules (300 mg total) by mouth 3 (three) times daily.  870 capsule  6  . hydrALAZINE (APRESOLINE) 25 MG tablet Take 50 mg by mouth 2 (two) times daily.       [name] lidocaine-prilocaine (EMLA) cream Apply topically as needed. Apply to port-a-cath one - two hours before accessing.  30 g  3  . losartan (COZAAR) 100 MG tablet Take 100 mg by mouth daily.      . metoprolol succinate (TOPROL-XL) 25 MG 24 hr tablet Take 50 mg by mouth daily.       . pantoprazole (PROTONIX) 40 MG tablet Take 1 tablet (40 mg total) by mouth daily.  30 tablet  3  . pioglitazone-metformin (ACTOPLUS MET) 15-850 MG per tablet Take 1 tablet by mouth every morning.       . potassium chloride SA (K-DUR,KLOR-CON) 20 MEQ tablet Take 1 tablet (20 mEq total) by mouth 2 (two) times daily.  60 tablet  0  . rosuvastatin (CRESTOR) 5 MG tablet Take 5 mg by mouth daily.      . sitaGLIPtin (JANUVIA) 100 MG  tablet Take 100 mg by mouth daily.      . fluticasone (FLONASE) 50 MCG/ACT nasal spray Place 1 spray into both nostrils daily.  9.9 g  0  . HYDROcodone-acetaminophen (NORCO/VICODIN) 5-325 MG per tablet Take 1-2 tablets by mouth every 4 (four) hours as needed for moderate pain.      [name] LORazepam (ATIVAN) 0.5 MG tablet Take 1 tablet (0.$RemoveBef'5mg'fqMUnttPVZ$ ) every 6 hours as needed for nausea or vomiting.  30 tablet  2  . ondansetron (ZOFRAN) 8 MG tablet Take 1 tablet ($RemoveB'8mg'POBhgirL$ s) every 12 hours as needed for nausea or vomiting starting the 3rd day after chemotherapy.  30 tablet  1   No current facility-administered medications for this visit.   Facility-Administered Medications Ordered in Other Visits  Medication Dose Route Frequency Provider Last Rate Last Dose  . sodium chloride 0.9 % injection 10 mL  10 mL Intravenous PRN [name], MD   10 mL at 05/24/13 1522     Allergies:  Allergies  Allergen Reactions  .  Cephalexin Other (See Comments)    Burned throat.  . Adhesive [Tape] Rash    Clear tape gloves ,elastic no problem    Medical History: Past Medical History  Diagnosis Date  . Hyperlipidemia   . Hypertension   . Bipolar 1 disorder   . Cancer   . Hypertension 04/04/2013  . Hyperlipidemia 04/04/2013  . Bipolar 1 disorder 04/04/2013  . Breast cancer 03/10/13    left breast invasive ca  . Allergy     latex  . Obesity   . Diabetes mellitus without complication     NIDDM  . Sleep apnea     no cpap due to insurance  . Heart murmur   . GERD (gastroesophageal reflux disease)   . Coronary artery disease     Surgical History:  Past Surgical History  Procedure Laterality Date  . Cholecystectomy    . Breast biopsy Left 05/08/09    fibroadenoma with microcalcifications,no malignancy id  . Breast biopsy Left 03/07/13    invasive ca,1 lymph node metastatic  . 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 Abdominal hysterectomy    . Coronary stent placement  2001  . Cardiac catheterization    . Eye surgery  6/13,9/14    laser,cataract lft  . Coronary angioplasty      LAD stent 2003  . Portacath placement Right 04/22/2013    Procedure: INSERTION PORT-A-CATH;  Surgeon: Adin Hector, MD;  Location: [place];  Service: General;  Laterality: Right;  . Mastectomy modified radical Left 04/22/2013    Procedure: MASTECTOMY MODIFIED RADICAL;  Surgeon: Adin Hector, MD;  Location: [place];  Service: General;  Laterality: Left;  . Breast lumpectomy with needle localization and axillary sentinel lymph node bx Right 04/22/2013    Procedure: BREAST LUMPECTOMY WITH NEEDLE LOCALIZATION AND AXILLARY SENTINEL LYMPH NODE BX;  Surgeon: Adin Hector, MD;  Location: [place];  Service: General;  Laterality: Right;  . Esophagogastroduodenoscopy N/A 08/07/2013    Procedure: ESOPHAGOGASTRODUODENOSCOPY (EGD);  Surgeon: Missy Sabins, MD;  Location: [facility];   Service: Endoscopy;  Laterality: N/A;  . Esophagogastroduodenoscopy N/A 11/13/2013    Procedure: ESOPHAGOGASTRODUODENOSCOPY (EGD);  Surgeon: Wonda Horner, MD;  Location: [facility];  Service: Endoscopy;  Laterality: N/A;     Review of Systems: A 10 point review of systems was conducted and is otherwise negative except for what is noted above.     Physical  Exam: Blood pressure 172/88, pulse 92, temperature 98.2 F (36.8 C), temperature source Oral, resp. rate 18, height 5\' 2"  (1.575 m), weight 196 lb 14.4 oz (89.313 kg). GENERAL: Patient is a well appearing female wearing a mask, appears to obviously feel unwell HEENT:  Sclerae anicteric.  Oropharynx clear and moist. No ulcerations or evidence of oropharyngeal candidiasis. Neck is supple. PERRLA. NODES:  No cervical, supraclavicular, or axillary lymphadenopathy palpated.  BREAST EXAM:  Deferred. LUNGS:  Clear throughout  HEART:  Regular rate and rhythm. No murmur appreciated. ABDOMEN:  Soft, nontender.  Positive, normoactive bowel sounds. No organomegaly palpated. MSK:  No focal spinal tenderness to palpation. Full range of motion bilaterally in the upper extremities. EXTREMITIES:  No peripheral edema.   SKIN:  Clear with no obvious rashes or skin changes. Nails are brittle and separating from the nail bed, the patient has lost several fingernails.  There is no sign of paronychia.  NEURO:  Nonfocal. Well oriented.  Appropriate affect.  ECOG PERFORMANCE STATUS: 1 - Symptomatic but completely ambulatory   Lab Results: Lab Results  Component Value Date   WBC 5.4 11/22/2013   HGB 10.4* 11/22/2013   HCT 32.0* 11/22/2013   MCV 90.9 11/22/2013   PLT 368 11/22/2013     Chemistry      Component Value Date/Time   NA 139 11/22/2013 1241   NA 137 11/11/2013 0910   K 3.7 11/22/2013 1241   K 3.6* 11/11/2013 0910   CL 99 11/11/2013 0910   CO2 25 11/22/2013 1241   CO2 24 11/11/2013 0910   BUN 11.7 11/22/2013 1241   BUN 6 11/11/2013 0910   CREATININE 0.9  11/22/2013 1241   CREATININE 0.70 11/11/2013 1800      Component Value Date/Time   CALCIUM 9.9 11/22/2013 1241   CALCIUM 9.0 11/11/2013 0910   ALKPHOS 96 11/22/2013 1241   ALKPHOS 87 11/11/2013 0910   AST 14 11/22/2013 1241   AST 16 11/11/2013 0910   ALT 7 11/22/2013 1241   ALT 10 11/11/2013 0910   BILITOT 0.46 11/22/2013 1241   BILITOT 0.5 11/11/2013 0910      Assessment and Plan: 11/13/2013 58 y.o. female with  1. 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  She has underwent a left mastectomy and right lumpectomy.  She is currently receiving adjuvant chemotherapy.  She completed 6 cycles of FEC, received 2 cycles of single agent Taxol that was discontinued due to neuropathy, and has since started single agent Abraxane on 08/30/13.  She receives this treatment on day 1, day 8, and day 15, on a 28 day cycle.  She is tolerating this treatment well. Due to her hypertension, she does not receive any Dexamethasone with her treatment both IV or PO. She will proceed with cycle 3 day 15 today.   2. Neuropathy:  This is stable.  I recommended she continue with Gabapentin TID.    3. Hypertension: She continues on Furosemide, Losartan, Metoprolol, Hydralazine, and Amlodipine.  Her BP is elevated today.  We will continue to follow.  I recommended f/u with Dr. 09/01/13 since she was recently discharged.    4. Nail Dyscrasia:  Her fingernails appear stable from her last visit on exam.  She will continue soaking her nails in tea tree oil BID.    The patient will return on 12/13/13 for labs, evaluation and cycle 4 day 1 of  Abraxane therapy.  This is one week later than she is due because she is traveling.  She knows to call [name] in the interim for any questions or concerns.  We can certainly see her sooner if needed.  I spent 25 minutes counseling the patient face to face.  The total time spent in the appointment was 30  minutes.  Minette Headland, [place] [phone] 11/26/2013 10:37 PM

## 2013-11-25 ENCOUNTER — Telehealth: Payer: Self-pay

## 2013-11-25 ENCOUNTER — Telehealth: Payer: Self-pay | Admitting: *Deleted

## 2013-11-25 NOTE — Telephone Encounter (Signed)
Confirmed appt with patient - next d/t 5/29.  Pt voiced understanding.

## 2013-11-25 NOTE — Telephone Encounter (Signed)
Per staff phone call and POF I have schedueld appts.  JMW  

## 2013-11-25 NOTE — Telephone Encounter (Signed)
LMOVM - requested pt call back.

## 2013-11-25 NOTE — Telephone Encounter (Signed)
Received call from patient that she fell this morning and has been having hip pain since. She is able to walk and has not taken anything for pain. Suggested to patient that she take her Norco and alternate with Ibuprofen today. Apply ice for first 48 hours. However, if she feels pain gets worse she should report to ER for further eval. Patient agreed and will call us in the morning if not feeling better for further discussion.

## 2013-11-26 ENCOUNTER — Encounter: Payer: Self-pay | Admitting: Adult Health

## 2013-12-06 ENCOUNTER — Ambulatory Visit: Payer: Medicare HMO

## 2013-12-06 ENCOUNTER — Ambulatory Visit: Payer: Medicare HMO | Admitting: Adult Health

## 2013-12-06 ENCOUNTER — Other Ambulatory Visit: Payer: Medicare HMO

## 2013-12-13 ENCOUNTER — Ambulatory Visit (HOSPITAL_BASED_OUTPATIENT_CLINIC_OR_DEPARTMENT_OTHER): Payer: Medicare HMO | Admitting: Adult Health

## 2013-12-13 ENCOUNTER — Other Ambulatory Visit (HOSPITAL_BASED_OUTPATIENT_CLINIC_OR_DEPARTMENT_OTHER): Payer: Medicare HMO

## 2013-12-13 ENCOUNTER — Encounter: Payer: Self-pay | Admitting: Adult Health

## 2013-12-13 ENCOUNTER — Other Ambulatory Visit: Payer: Self-pay

## 2013-12-13 ENCOUNTER — Ambulatory Visit: Payer: Medicare HMO

## 2013-12-13 VITALS — BP 169/73 | HR 88 | Temp 98.5°F | Resp 20 | Ht 62.0 in | Wt 199.1 lb

## 2013-12-13 DIAGNOSIS — Z9010 Acquired absence of unspecified breast and nipple: Secondary | ICD-10-CM

## 2013-12-13 DIAGNOSIS — Z170 Estrogen receptor positive status [ER+]: Secondary | ICD-10-CM

## 2013-12-13 DIAGNOSIS — C50419 Malignant neoplasm of upper-outer quadrant of unspecified female breast: Secondary | ICD-10-CM

## 2013-12-13 DIAGNOSIS — D0590 Unspecified type of carcinoma in situ of unspecified breast: Secondary | ICD-10-CM

## 2013-12-13 DIAGNOSIS — C50412 Malignant neoplasm of upper-outer quadrant of left female breast: Secondary | ICD-10-CM

## 2013-12-13 DIAGNOSIS — I10 Essential (primary) hypertension: Secondary | ICD-10-CM

## 2013-12-13 DIAGNOSIS — C773 Secondary and unspecified malignant neoplasm of axilla and upper limb lymph nodes: Secondary | ICD-10-CM

## 2013-12-13 DIAGNOSIS — G609 Hereditary and idiopathic neuropathy, unspecified: Secondary | ICD-10-CM

## 2013-12-13 LAB — CBC WITH DIFFERENTIAL/PLATELET
BASO%: 1.3 % (ref 0.0–2.0)
BASOS ABS: 0.1 10*3/uL (ref 0.0–0.1)
EOS ABS: 0.1 10*3/uL (ref 0.0–0.5)
EOS%: 1.6 % (ref 0.0–7.0)
HCT: 32.5 % — ABNORMAL LOW (ref 34.8–46.6)
HEMOGLOBIN: 10.5 g/dL — AB (ref 11.6–15.9)
LYMPH%: 16.5 % (ref 14.0–49.7)
MCH: 29.6 pg (ref 25.1–34.0)
MCHC: 32.4 g/dL (ref 31.5–36.0)
MCV: 91.2 fL (ref 79.5–101.0)
MONO#: 0.7 10*3/uL (ref 0.1–0.9)
MONO%: 7.8 % (ref 0.0–14.0)
NEUT%: 72.8 % (ref 38.4–76.8)
NEUTROS ABS: 6.6 10*3/uL — AB (ref 1.5–6.5)
Platelets: 356 10*3/uL (ref 145–400)
RBC: 3.56 10*6/uL — AB (ref 3.70–5.45)
RDW: 15.9 % — ABNORMAL HIGH (ref 11.2–14.5)
WBC: 9.1 10*3/uL (ref 3.9–10.3)
lymph#: 1.5 10*3/uL (ref 0.9–3.3)

## 2013-12-13 LAB — COMPREHENSIVE METABOLIC PANEL (CC13)
ALT: 9 U/L (ref 0–55)
AST: 13 U/L (ref 5–34)
Albumin: 4 g/dL (ref 3.5–5.0)
Alkaline Phosphatase: 103 U/L (ref 40–150)
Anion Gap: 12 mEq/L — ABNORMAL HIGH (ref 3–11)
BILIRUBIN TOTAL: 0.33 mg/dL (ref 0.20–1.20)
BUN: 18.3 mg/dL (ref 7.0–26.0)
CHLORIDE: 105 meq/L (ref 98–109)
CO2: 22 meq/L (ref 22–29)
Calcium: 10.3 mg/dL (ref 8.4–10.4)
Creatinine: 1.5 mg/dL — ABNORMAL HIGH (ref 0.6–1.1)
GLUCOSE: 152 mg/dL — AB (ref 70–140)
Potassium: 4.4 mEq/L (ref 3.5–5.1)
Sodium: 138 mEq/L (ref 136–145)
TOTAL PROTEIN: 7.3 g/dL (ref 6.4–8.3)

## 2013-12-13 MED ORDER — HYDROCODONE-ACETAMINOPHEN 5-325 MG PO TABS: 1.0000 | ORAL_TABLET | ORAL | Status: DC | PRN

## 2013-12-13 NOTE — Patient Instructions (Signed)
Anastrozole tablets What is this medicine? ANASTROZOLE (an AS troe zole) is used to treat breast cancer in women who have gone through menopause. Some types of breast cancer depend on estrogen to grow, and this medicine can stop tumor growth by blocking estrogen production. This medicine may be used for other purposes; ask your health care provider or pharmacist if you have questions. COMMON BRAND NAME(S): Arimidex What should I tell my health care provider before I take this medicine? They need to know if you have any of these conditions: -liver disease -an unusual or allergic reaction to anastrozole, other medicines, foods, dyes, or preservatives -pregnant or trying to get pregnant -breast-feeding How should I use this medicine? Take this medicine by mouth with a glass of water. Follow the directions on the prescription label. You can take this medicine with or without food. Take your doses at regular intervals. Do not take your medicine more often than directed. Do not stop taking except on the advice of your doctor or health care professional. Talk to your pediatrician regarding the use of this medicine in children. Special care may be needed. Overdosage: If you think you have taken too much of this medicine contact a poison control center or emergency room at once. NOTE: This medicine is only for you. Do not share this medicine with others. What if I miss a dose? If you miss a dose, take it as soon as you can. If it is almost time for your next dose, take only that dose. Do not take double or extra doses. What may interact with this medicine? Do not take this medicine with any of the following medications: -female hormones, like estrogens or progestins and birth control pills This medicine may also interact with the following medications: -tamoxifen This list may not describe all possible interactions. Give your health care provider a list of all the medicines, herbs, non-prescription  drugs, or dietary supplements you use. Also tell them if you smoke, drink alcohol, or use illegal drugs. Some items may interact with your medicine. What should I watch for while using this medicine? Visit your doctor or health care professional for regular checks on your progress. Let your doctor or health care professional know about any unusual vaginal bleeding. Do not treat yourself for diarrhea, nausea, vomiting or other side effects. Ask your doctor or health care professional for advice. What side effects may I notice from receiving this medicine? Side effects that you should report to your doctor or health care professional as soon as possible: -allergic reactions like skin rash, itching or hives, swelling of the face, lips, or tongue -any new or unusual symptoms -breathing problems -chest pain -leg pain or swelling -vomiting Side effects that usually do not require medical attention (report to your doctor or health care professional if they continue or are bothersome): -back or bone pain -cough, or throat infection -diarrhea or constipation -dizziness -headache -hot flashes -loss of appetite -nausea -sweating -weakness and tiredness -weight gain This list may not describe all possible side effects. Call your doctor for medical advice about side effects. You may report side effects to FDA at 1-800-FDA-1088. Where should I keep my medicine? Keep out of the reach of children. Store at room temperature between 20 and 25 degrees C (68 and 77 degrees F). Throw away any unused medicine after the expiration date. NOTE: This sheet is a summary. It may not cover all possible information. If you have questions about this medicine, talk to your doctor, pharmacist,   or health care provider.  2014, Elsevier/Gold Standard. (2007-09-14 16:31:52) Tamoxifen oral tablet What is this medicine? TAMOXIFEN (ta MOX i fen) blocks the effects of estrogen. It is commonly used to treat breast cancer. It  is also used to decrease the chance of breast cancer coming back in women who have received treatment for the disease. It may also help prevent breast cancer in women who have a high risk of developing breast cancer. This medicine may be used for other purposes; ask your health care provider or pharmacist if you have questions. COMMON BRAND NAME(S): Nolvadex What should I tell my health care provider before I take this medicine? They need to know if you have any of these conditions: -blood clots -blood disease -cataracts or impaired eyesight -endometriosis -high calcium levels -high cholesterol -irregular menstrual cycles -liver disease -stroke -uterine fibroids -an unusual or allergic reaction to tamoxifen, other medicines, foods, dyes, or preservatives -pregnant or trying to get pregnant -breast-feeding How should I use this medicine? Take this medicine by mouth with a glass of water. Follow the directions on the prescription label. You can take it with or without food. Take your medicine at regular intervals. Do not take your medicine more often than directed. Do not stop taking except on your doctor's advice. A special MedGuide will be given to you by the pharmacist with each prescription and refill. Be sure to read this information carefully each time. Talk to your pediatrician regarding the use of this medicine in children. While this drug may be prescribed for selected conditions, precautions do apply. Overdosage: If you think you have taken too much of this medicine contact a poison control center or emergency room at once. NOTE: This medicine is only for you. Do not share this medicine with others. What if I miss a dose? If you miss a dose, take it as soon as you can. If it is almost time for your next dose, take only that dose. Do not take double or extra doses. What may interact with this medicine? -aminoglutethimide -bromocriptine -chemotherapy drugs -female hormones, like  estrogens and birth control pills -letrozole -medroxyprogesterone -phenobarbital -rifampin -warfarin This list may not describe all possible interactions. Give your health care provider a list of all the medicines, herbs, non-prescription drugs, or dietary supplements you use. Also tell them if you smoke, drink alcohol, or use illegal drugs. Some items may interact with your medicine. What should I watch for while using this medicine? Visit your doctor or health care professional for regular checks on your progress. You will need regular pelvic exams, breast exams, and mammograms. If you are taking this medicine to reduce your risk of getting breast cancer, you should know that this medicine does not prevent all types of breast cancer. If breast cancer or other problems occur, there is no guarantee that it will be found at an early stage. Do not become pregnant while taking this medicine or for 2 months after stopping this medicine. Stop taking this medicine if you get pregnant or think you are pregnant and contact your doctor. This medicine may harm your unborn baby. Women who can possibly become pregnant should use birth control methods that do not use hormones during tamoxifen treatment and for 2 months after therapy has stopped. Talk with your health care provider for birth control advice. Do not breast feed while taking this medicine. What side effects may I notice from receiving this medicine? Side effects that you should report to your doctor or health care  professional as soon as possible: -changes in vision (blurred vision) -changes in your menstrual cycle -difficulty breathing or shortness of breath -difficulty walking or talking -new breast lumps -numbness -pelvic pain or pressure -redness, blistering, peeling or loosening of the skin, including inside the mouth -skin rash or itching (hives) -sudden chest pain -swelling of lips, face, or tongue -swelling, pain or tenderness in your  calf or leg -unusual bruising or bleeding -vaginal discharge that is bloody, brown, or rust -weakness -yellowing of the whites of the eyes or skin Side effects that usually do not require medical attention (report to your doctor or health care professional if they continue or are bothersome): -fatigue -hair loss, although uncommon and is usually mild -headache -hot flashes -impotence (in men) -nausea, vomiting (mild) -vaginal discharge (white or clear) This list may not describe all possible side effects. Call your doctor for medical advice about side effects. You may report side effects to FDA at 1-800-FDA-1088. Where should I keep my medicine? Keep out of the reach of children. Store at room temperature between 20 and 25 degrees C (68 and 77 degrees F). Protect from light. Keep container tightly closed. Throw away any unused medicine after the expiration date. NOTE: This sheet is a summary. It may not cover all possible information. If you have questions about this medicine, talk to your doctor, pharmacist, or health care provider.  2014, Elsevier/Gold Standard. (2008-03-20 12:01:56)

## 2013-12-13 NOTE — Progress Notes (Signed)
Hematology and Oncology Follow Up Visit  Kathy Maxwell [id number] 01-11-1956 58 y.o. 12/13/2013 2:45 PM     Principal Diagnosis:Kathy Maxwell 58 y.o. 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 Screening mammogram in 12/2012 was abnormal which led to left digital diagnostic mammogram and left breast ultrasound on 03/01/2013. Ultrasound of the left breast showed a hypoechoic mass at 12:30, 6 cm from the nipple, that measured 1.9 x 2.6 x 2.9 cm. Ultrasound of the left axilla demonstrates a 1.1 x 0.7 x 0.9 cm lymph node with a borderline thickened cortex.   2. Left breast needle core biopsy at the 12:30 o'clock position and left needle core biopsy of the left axillary lymph node on 03/07/2013 showed invasive mammary carcinoma with features compatible with a grade 1 or 2 ductal carcinoma in the breast needle core biopsy and lymph node was positive for metastatic mammary carcinoma with a macrometastasis and extracapsular extension identified. Estrogen receptor 100% positive, progesterone receptor 75% positive, Ki-67 15%, HER-2/neu by CISH no amplification.   3. Bilateral breast MRI on 03/15/2013 showed in the left breast there were at least 4 focus of abnormal spiculated enhancement in the upper left breast centrally. The entire area measured 7.4 cm x 6.6 cm x 5.1 cm. The previous biopsy focus was posteriorly located and measured 2.1 x 1 x 2.1 cm. The anterior focus that was not biopsied measured 3.1 x 2.6 x 2.6 cm. A lateral focus that was not biopsied measured 1.1 x 1.4 x 1.6 cm. There were small medial focus measuring cyst 0.7 x 0.8 x 0.7 cm Post biopsy changes were identified in the left axilla. There were abnormal enlarged lymph nodes correlating to the patient's known metastatic neoplasm to the lymph nodes which the largest lymph node measured 1.8 x 0.8 cm. In the right breast there was an irregular  focus of 3.3 x 3.1 x 1.3 cm abnormal enhancement in the central right breast in the middle one third. There was a mild thickened cortex and prominent lymph nodes within the right axilla with the largest measuring 1.6 x 1 cm. There was a posterior central upper breast focus with associated clip artifact measuring 1.2 x 2.2 x 1 cm previously biopsied to be benign. Suspicious findings in bilateral breasts.   4. Anterior left breast needle core biopsy and central right breast needle core biopsy on 04/03/2013. Left breast needle core biopsy showed invasive ductal carcinoma and ductal carcinoma in situ, 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% positive, progesterone receptor 23% positive, Ki-67 33%, and HER-2/neu by CISH no amplification. Right breast needle core biopsy showed ductal carcinoma in situ, 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% positive, progesterone receptor 11% positive.   5. Status post left breast modified radical mastectomy with left axillary lymph node resection and right breast lumpectomy with right axillary node biopsy on 04/22/2013 with pathology results as follows:  Left breast: Stage IIIA, pT3, pN2a, invasive ductal carcinoma with multiple foci, grade 1, largest span was 5.5 cm and ductal carcinoma in situ low-grade, 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% positive, progesterone receptor 23% positive, HER-2/neu no amplification, Ki-67 33%, with 4/22 metastatic lymph nodes and 1 isolated tumor cells identified within left axillary lymph node resection. Extracapsular extension was present and surgical resection margins were negative for carcinoma.  Right breast: Stage 0, pTis, pN0, 3.0 cm ductal carcinoma in situ with calcifications, intermediate to high-grade, 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% positive, progesterone receptor 11% positive, with 0/4  metastatic right axillary lymph nodes.  Reexcision of right breast medial margin: Ductal carcinoma in situ, intermediate grade spanning 0.8 cm, ductal carcinoma in situ is focally less than 0.1 cm from the new  medial margin, lobular neoplasia (atypical lobular hyperplasia).   6. 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 status post adjuvant chemotherapy consisting of FEC (5-FU/epirubicin/Cytoxan) completed 05/24/2013 - 07/23/13.   8. Status post 2 cycles of single agent Taxol administered from 08/16/2013 through 08/23/2013. 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 Receiving single agent Abraxane on 08/30/13.    Current therapy:  Abraxane cycle 4 day 1  Interim History: Kathy Maxwell 58 y.o. 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  She is receiving this in the adjuvant setting.    She is feeling relatively well today.  She is very excited that today is her last treatment with Abraxane.  She is taking Gabapentin TID for her neuropathy and continues to tolerate it well.  Her fingernails are stable.  She continues to soak them in tea tree oil.  She denies fevers, chills, nausea, vomiting, constipation, diarrhea, increased neuropathy, skin changes, or any further concerns.   Medications:  Current Outpatient Prescriptions  Medication Sig Dispense Refill  . amLODipine (NORVASC) 10 MG tablet Take 10 mg by mouth daily.      [name] b complex vitamins tablet Take 1 tablet by mouth daily.      . Cholecalciferol (VITAMIN D PO) Take 1 tablet by mouth daily.      . furosemide (LASIX) 40 MG tablet Take 1 tablet (40 mg total) by mouth daily.  30 tablet  0  . gabapentin (NEURONTIN) 100 MG capsule Take 3 capsules (300 mg total) by mouth 3 (three) times daily.  870 capsule  6  . hydrALAZINE (APRESOLINE) 25 MG tablet Take 50 mg by mouth 2 (two) times daily.       [name] HYDROcodone-acetaminophen (NORCO/VICODIN) 5-325 MG per tablet Take 1-2 tablets by mouth every 4 (four) hours as needed for moderate pain.      [name]  lidocaine-prilocaine (EMLA) cream Apply topically as needed. Apply to port-a-cath one - two hours before accessing.  30 g  3  . losartan (COZAAR) 100 MG tablet Take 100 mg by mouth daily.      . metoprolol succinate (TOPROL-XL) 25 MG 24 hr tablet Take 50 mg by mouth daily.       . pantoprazole (PROTONIX) 40 MG tablet Take 1 tablet (40 mg total) by mouth daily.  30 tablet  3  . pioglitazone-metformin (ACTOPLUS MET) 15-850 MG per tablet Take 1 tablet by mouth every morning.       . potassium chloride SA (K-DUR,KLOR-CON) 20 MEQ tablet Take 1 tablet (20 mEq total) by mouth 2 (two) times daily.  60 tablet  0  . rosuvastatin (CRESTOR) 5 MG tablet Take 5 mg by mouth daily.      . sitaGLIPtin (JANUVIA) 100 MG tablet Take 100 mg by mouth daily.      [name] buPROPion (WELLBUTRIN SR) 150 MG 12 hr tablet Take 1 tablet (150 mg total) by mouth daily.  30 tablet  6  . fluticasone (FLONASE) 50 MCG/ACT nasal spray Place 1 spray into both nostrils daily.  9.9 g  0  . LORazepam (  ATIVAN) 0.5 MG tablet Take 1 tablet (0.$RemoveBef'5mg'dMGgYBdJGV$ ) every 6 hours as needed for nausea or vomiting.  30 tablet  2  . ondansetron (ZOFRAN) 8 MG tablet Take 1 tablet ($RemoveB'8mg'MCzSHDgZ$ s) every 12 hours as needed for nausea or vomiting starting the 3rd day after chemotherapy.  30 tablet  1   No current facility-administered medications for this visit.   Facility-Administered Medications Ordered in Other Visits  Medication Dose Route Frequency Provider Last Rate Last Dose  . sodium chloride 0.9 % injection 10 mL  10 mL Intravenous PRN [name], MD   10 mL at 05/24/13 1522     Allergies:  Allergies  Allergen Reactions  . Cephalexin Other (See Comments)    Burned throat.  . Adhesive [Tape] Rash    Clear tape gloves ,elastic no problem    Medical History: Past Medical History  Diagnosis Date  . Hyperlipidemia   . Hypertension   . Bipolar 1 disorder   . Cancer   . Hypertension 04/04/2013  . Hyperlipidemia 04/04/2013  . Bipolar 1 disorder 04/04/2013  .  Breast cancer 03/10/13    left breast invasive ca  . Allergy     latex  . Obesity   . Diabetes mellitus without complication     NIDDM  . Sleep apnea     no cpap due to insurance  . Heart murmur   . GERD (gastroesophageal reflux disease)   . Coronary artery disease     Surgical History:  Past Surgical History  Procedure Laterality Date  . Cholecystectomy    . Breast biopsy Left 05/08/09    fibroadenoma with microcalcifications,no malignancy id  . Breast biopsy Left 03/07/13    invasive ca,1 lymph node metastatic  . 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 Abdominal hysterectomy    . Coronary stent placement  2001  . Cardiac catheterization    . Eye surgery  6/13,9/14    laser,cataract lft  . Coronary angioplasty      LAD stent 2003  . Portacath placement Right 04/22/2013    Procedure: INSERTION PORT-A-CATH;  Surgeon: Adin Hector, MD;  Location: [facility];  Service: General;  Laterality: Right;  . Mastectomy modified radical Left 04/22/2013    Procedure: MASTECTOMY MODIFIED RADICAL;  Surgeon: Adin Hector, MD;  Location: [facility];  Service: General;  Laterality: Left;  . Breast lumpectomy with needle localization and axillary sentinel lymph node bx Right 04/22/2013    Procedure: BREAST LUMPECTOMY WITH NEEDLE LOCALIZATION AND AXILLARY SENTINEL LYMPH NODE BX;  Surgeon: Adin Hector, MD;  Location: [facility];  Service: General;  Laterality: Right;  . Esophagogastroduodenoscopy N/A 08/07/2013    Procedure: ESOPHAGOGASTRODUODENOSCOPY (EGD);  Surgeon: Missy Sabins, MD;  Location: [facility];  Service: Endoscopy;  Laterality: N/A;  . Esophagogastroduodenoscopy N/A 11/13/2013    Procedure: ESOPHAGOGASTRODUODENOSCOPY (EGD);  Surgeon: Wonda Horner, MD;  Location: [facility];  Service: Endoscopy;  Laterality: N/A;     Review of Systems: A 10 point review of systems was conducted and is otherwise negative except for what is noted  above.     Physical Exam: Blood pressure 169/73, pulse 88, temperature 98.5 F (36.9 C), temperature source Oral, resp. rate 20, height $RemoveBe'5\' 2"'QZlCvceOa$  (1.575 m), weight 199 lb 1.6 oz (90.311 kg). GENERAL: Patient is a well appearing female wearing a mask, appears to obviously feel unwell HEENT:  Sclerae anicteric.  Oropharynx clear and moist. No ulcerations or evidence of oropharyngeal candidiasis.  Neck is supple. PERRLA. NODES:  No cervical, supraclavicular, or axillary lymphadenopathy palpated.  BREAST EXAM:  Deferred. LUNGS:  Clear throughout  HEART:  Regular rate and rhythm. No murmur appreciated. ABDOMEN:  Soft, nontender.  Positive, normoactive bowel sounds. No organomegaly palpated. MSK:  No focal spinal tenderness to palpation. Full range of motion bilaterally in the upper extremities. EXTREMITIES:  No peripheral edema.   SKIN:  Clear with no obvious rashes or skin changes. Nails are brittle and separating from the nail bed, the patient has lost several fingernails.  There is no sign of paronychia.  NEURO:  Nonfocal. Well oriented.  Appropriate affect.  ECOG PERFORMANCE STATUS: 1 - Symptomatic but completely ambulatory   Lab Results: Lab Results  Component Value Date   WBC 9.1 12/13/2013   HGB 10.5* 12/13/2013   HCT 32.5* 12/13/2013   MCV 91.2 12/13/2013   PLT 356 12/13/2013     Chemistry      Component Value Date/Time   NA 138 12/13/2013 1307   NA 137 11/11/2013 0910   K 4.4 12/13/2013 1307   K 3.6* 11/11/2013 0910   CL 99 11/11/2013 0910   CO2 22 12/13/2013 1307   CO2 24 11/11/2013 0910   BUN 18.3 12/13/2013 1307   BUN 6 11/11/2013 0910   CREATININE 1.5* 12/13/2013 1307   CREATININE 0.70 11/11/2013 1800      Component Value Date/Time   CALCIUM 10.3 12/13/2013 1307   CALCIUM 9.0 11/11/2013 0910   ALKPHOS 103 12/13/2013 1307   ALKPHOS 87 11/11/2013 0910   AST 13 12/13/2013 1307   AST 16 11/11/2013 0910   ALT 9 12/13/2013 1307   ALT 10 11/11/2013 0910   BILITOT 0.33 12/13/2013 1307    BILITOT 0.5 11/11/2013 0910      Assessment and Plan: Kathy Maxwell 58 y.o. female with  1. 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  She has underwent a left mastectomy and right lumpectomy.  She is currently receiving adjuvant chemotherapy.  She completed 6 cycles of FEC, received 2 cycles of single agent Taxol that was discontinued due to neuropathy, and has since started single agent Abraxane on 08/30/13.  She receives this treatment on day 1, day 8, and day 15, on a 28 day cycle.  She is tolerating this treatment well. Due to her hypertension, she does not receive any Dexamethasone with her treatment both IV or PO. She will proceed with cycle 4 day 1 today.  I reviewed her CBC with her today.  It is stable.  Since this is her last chemotherapy treatment, I referred her to Dr. Pablo Ledger for evaluation for adjuvant radiation therapy.    2. Neuropathy:  This is stable.  I recommended she continue with Gabapentin $RemoveBeforeD'300mg'JfoLOROldcXpmN$   TID.    3. Hypertension: She continues on Furosemide, Losartan, Metoprolol, Hydralazine, and Amlodipine. Her creatinine was 1.5 today.  She received a call from [facility] to stop taking Lasix and call Dr. Baird Cancer for evaluation and management of her hypertension.    4. Nail Dyscrasia:  Her fingernails appear stable from her last visit on exam.  She will continue soaking her nails in tea tree oil BID.    The patient will return in 2-2.5 months for labs and evaluation by Dr. Jana Hakim.  She knows to call us in the interim for any questions or concerns.  We can certainly see her sooner if needed.  I spent 25 minutes counseling the patient face to face.  The total time spent in the appointment was 30 minutes.  Minette Headland, [place] [phone] 12/13/2013 2:45 PM

## 2013-12-16 ENCOUNTER — Telehealth: Payer: Self-pay | Admitting: Adult Health

## 2013-12-17 ENCOUNTER — Telehealth: Payer: Self-pay | Admitting: *Deleted

## 2013-12-17 NOTE — Telephone Encounter (Signed)
Called pt to inform her of lab results. Kidney creatinine  function were (1.5H).  Communicated with pt to stop Lasix per Charlestine Massed, NP. Also I discussed with pt to increase her fluid intake and told her that I would fax these labs to her primary care doctor so they can discuss her HTN and kidney function. She will see Dr.Sanders on 11/22/13. Pt verbalized understanding. Message to be forwarded to Charlestine Massed, NP.

## 2013-12-17 NOTE — Telephone Encounter (Signed)
Message copied by Harmon Pier on Tue Dec 17, 2013  5:07 PM ------      Message from: Minette Headland      Created: Tue Dec 17, 2013  8:47 AM       Patient needs to increase her fluid intake PO, stop the Lasix and we need to fax her labs to Dr. Baird Cancer so f/u can be made to discuss her hypertension and kidney function.            Thanks,       LC       ----- Message -----         From: [facility]         Sent: 12/13/2013   1:15 PM           To: [name], MD                   ------

## 2013-12-20 ENCOUNTER — Ambulatory Visit: Payer: Medicare HMO

## 2013-12-20 ENCOUNTER — Other Ambulatory Visit: Payer: Medicare HMO

## 2013-12-31 NOTE — Progress Notes (Signed)
Location of Breast Cancer Right/Left, FUNC seen 04/10/13  Histology per Pathology Report:04/03/13:1. 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   2. Breast, right, needle core biopsy, central - DUCTAL CARCINOMA IN SITU.  03/07/13:1. Breast, left, needle core biopsy, mass, 12:30 o'clock- INVASIVE MAMMARY CARCINOMA, SEE COMMENT2. Lymph node, needle/core biopsy, borderline left axillary- ONE LYMPH NODE POSITIVE FOR METASTATIC MAMMARY CARCINOMA (1/1), SEE COMMENT.Receptor Status: ER(+), PR (+), Her2-neu (-)   Did patient present with symptoms (if so, please note symptoms) or was this found on screening mammography?:screening   Past/Anticipated interventions by surgeon, if any: surgery 04/22/13 Dr. Dalbert Batman   Past/Anticipated interventions by medical oncology, if any: Chemotherapy : . status post adjuvant chemotherapy consisting of FEC (5-FU/epirubicin/Cytoxan) completed 05/24/2013 - 07/23/13.  8. Status post 2 cycles of single agent Taxol administered from 08/16/2013 through 08/23/2013. 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 Receiving single agent Abraxane on 08/30/13.  Current therapy: Abraxane cycle 4 day 1  Last treatment 12/14/14, next appt 02/12/14 Dr.Magrinat    Lymphedema issues, if any: left arm slight bigger than right, no c/o tightness oe pain Pain issues, if any: tenderness at mastectomy site left area, well healed,  SAFETY ISSUES:  Prior radiation? no  Pacemaker/ICD? no  Possible current pregnancy?no  Is the patient on methotrexate? no Current Complaints / other details: Nail dyscrasia/   S/p chemotherapy, uses tea tree oil bid, neuropathy hands.feet, takes neurotin tid

## 2014-01-01 ENCOUNTER — Ambulatory Visit
Admission: RE | Admit: 2014-01-01 | Discharge: 2014-01-01 | Disposition: A | Discharge status: Home / Self Care | Payer: Medicare HMO | Source: Ambulatory Visit | Attending: Radiation Oncology | Admitting: Radiation Oncology

## 2014-01-01 ENCOUNTER — Encounter: Payer: Self-pay | Admitting: Radiation Oncology

## 2014-01-01 VITALS — BP 164/77 | HR 89 | Temp 98.6°F | Resp 20 | Ht 62.0 in | Wt 199.3 lb

## 2014-01-01 DIAGNOSIS — Z510 Encounter for antineoplastic radiation therapy: Secondary | ICD-10-CM | POA: Insufficient documentation

## 2014-01-01 DIAGNOSIS — Z79899 Other long term (current) drug therapy: Secondary | ICD-10-CM | POA: Insufficient documentation

## 2014-01-01 DIAGNOSIS — Z9221 Personal history of antineoplastic chemotherapy: Secondary | ICD-10-CM | POA: Diagnosis not present

## 2014-01-01 DIAGNOSIS — D0590 Unspecified type of carcinoma in situ of unspecified breast: Secondary | ICD-10-CM | POA: Insufficient documentation

## 2014-01-01 DIAGNOSIS — C50419 Malignant neoplasm of upper-outer quadrant of unspecified female breast: Secondary | ICD-10-CM

## 2014-01-01 DIAGNOSIS — Z9010 Acquired absence of unspecified breast and nipple: Secondary | ICD-10-CM | POA: Diagnosis not present

## 2014-01-01 DIAGNOSIS — C50412 Malignant neoplasm of upper-outer quadrant of left female breast: Secondary | ICD-10-CM

## 2014-01-01 DIAGNOSIS — C50919 Malignant neoplasm of unspecified site of unspecified female breast: Secondary | ICD-10-CM | POA: Insufficient documentation

## 2014-01-01 NOTE — Progress Notes (Signed)
Please see the Nurse Progress Note in the MD Initial Consult Encounter for this patient. 

## 2014-01-03 ENCOUNTER — Telehealth: Payer: Self-pay

## 2014-01-03 NOTE — Progress Notes (Signed)
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  She received her last chemotherapy may 29th. She is ready to begin radiation. She does have some lymphedema. She has been the ABC class. She has some neuropathy and some nail changes but otherwise is doing well.  Allergies:  Allergies  Allergen Reactions  . Cephalexin Other (See Comments)    Burned throat.  . Adhesive [Tape] Rash    Clear tape gloves ,elastic no problem    Medications:  Current Outpatient Prescriptions  Medication Sig Dispense Refill  . amLODipine (NORVASC) 10 MG tablet Take 10 mg by mouth daily.      [name] b complex vitamins tablet Take 1 tablet by mouth daily.      [name] buPROPion (WELLBUTRIN SR) 150 MG 12 hr tablet Take 1 tablet (150 mg total) by mouth daily.  30 tablet  6  . Cholecalciferol (VITAMIN D PO) Take 1 tablet by mouth daily.      . fluticasone (FLONASE) 50 MCG/ACT nasal spray Place 1 spray into both nostrils daily.  9.9 g  0  . gabapentin (NEURONTIN) 100 MG capsule Take 3 capsules (300 mg total) by mouth 3 (three) times daily.  870 capsule  6  . hydrALAZINE (APRESOLINE) 25 MG tablet Take 50 mg by mouth 2 (two) times daily.       [name] HYDROcodone-acetaminophen (NORCO/VICODIN) 5-325 MG per tablet Take 1-2 tablets by mouth every 4 (four) hours as needed for moderate pain.  30 tablet  0  . lidocaine-prilocaine (EMLA) cream Apply topically as needed. Apply to port-a-cath one - two hours before accessing.  30 g  3  . LORazepam (ATIVAN) 0.5 MG tablet Take 1 tablet (0.$RemoveBef'5mg'HQoBYemIrc$ ) every 6 hours as needed for nausea or vomiting.  30 tablet  2  . losartan (COZAAR) 100 MG tablet Take 100  mg by mouth daily.      . metoprolol succinate (TOPROL-XL) 25 MG 24 hr tablet Take 50 mg by mouth daily.       . ondansetron (ZOFRAN) 8 MG tablet Take 1 tablet ($RemoveB'8mg'agvSzUuX$ s) every 12 hours as needed for nausea or vomiting starting the 3rd day after chemotherapy.  30 tablet  1  . pantoprazole (PROTONIX) 40 MG tablet Take 1 tablet (40 mg total) by mouth daily.  30 tablet  3  . pioglitazone-metformin (ACTOPLUS MET) 15-850 MG per tablet Take 1 tablet by mouth every morning.       . potassium chloride SA (K-DUR,KLOR-CON) 20 MEQ tablet Take 1 tablet (20 mEq total) by mouth 2 (two) times daily.  60 tablet  0  . rosuvastatin (CRESTOR) 5 MG tablet Take 5 mg by mouth daily.      . sitaGLIPtin (JANUVIA) 100 MG tablet Take 100 mg by mouth daily.      . furosemide (LASIX) 40 MG tablet Take 1 tablet (40 mg total) by mouth daily.  30 tablet  0   No current facility-administered medications for this encounter.   Facility-Administered Medications Ordered in Other Encounters  Medication Dose Route Frequency Provider Last Rate Last Dose  . sodium chloride 0.9 % injection 10 mL  10 mL Intravenous  PRN [name], MD   10 mL at 05/24/13 1522    Physical Exam:  Filed Vitals:   01/01/14 1545  BP: 164/77  Pulse: 89  Temp: 98.6 F (37 C)  TempSrc: Oral  Resp: 20  Height: $Remove'5\' 2"'ksMTcTb$  (1.575 m)  Weight: 199 lb 4.8 oz (90.402 kg)   s/p mastectomy on the left. Healing well. S/p lumpectomy on the right. Healed incision  IMPRESSION: Kathy Maxwell is a 58 y.o. female s/p lumpectomy on the right for DCIS and mastectomy on the left with nodes positive.  PLAN:  I spoke to the patient today regarding her diagnosis and options for treatment. We discussed the equivalence in terms of survival and local failure between mastectomy and breast conservation. We discussed the role of radiation in decreasing local failures in patients who undergo lumpectomy. We discussed the role of radiation and decreasing local failures in patients who have tumor  size greater than 5 cm or positive lymph nodes after mastectomy, both of which rates factors she has. We discussed the process of simulation and the placement tattoos. We discussed 4 weeks of treatment as an outpatient. We discussed the possibility of asymptomatic lung damage. We discussed the low likelihood of secondary malignancies. We discussed the possible side effects including but not limited to skin redness, fatigue, permanent skin darkening, and breast swelling. We discussed increased complications with reconstruction. She has signed informed consent and we've scheduled her for simulation next week.    Thea Silversmith, MD

## 2014-01-03 NOTE — Telephone Encounter (Signed)
Returned pt message re: whether to continue potassium.  Advised pt per [facility] office note - pt was to see Dr. Baird Cancer to manage HTN.  Pt insists that clinic needs to give her direction on potassium as it was originally prescribed here.  Let pt know I would speak with Dr. Baird Cancer ofc.  Spoke with Tameka-RN at [facility].  Per Beau Fanny, they will take over management of her hypertension and they will contact patient.  Requested copy of last labs - faxed and sent to scan.    Called pt and let her know [facility] would be calling her with further instructions on her potassium.  Let pt know to expect call in next few days and if she doesn't hear from them in next few days to call us back.  Pt voiced understanding.

## 2014-01-10 ENCOUNTER — Encounter: Payer: Self-pay | Admitting: *Deleted

## 2014-01-10 NOTE — Progress Notes (Signed)
[place] Psychosocial Distress Screening Clinical Social Work  Clinical Social Work was referred by distress screening protocol.  The patient scored a 7 on the Psychosocial Distress Thermometer which indicates moderate distress. Clinical Social Worker phoned pt to assess for distress and other psychosocial needs. CSW left vm for pt. Physical symptoms appear to have been addressed by MD. CSW familiar with pt and will continue to reach out to pt.    ONCBCN DISTRESS SCREENING 01/01/2014  Mark the number that describes how much distress you have been experiencing in the past week 7  Emotional problem type Adjusting to illness  Physical Problem type Sleep/insomnia;Tingling hands/feet  Physician notified of physical symptoms Yes  Referral to clinical social work Yes     Clinical Social Worker follow up needed: Yes.    If yes, follow up plan: CSW familiar with pt and will continue to reach out to pt.   Loren Racer, LCSW Clinical Social Worker [facility] [place] [facility] Wednesday, Thursday and Friday Phone: [phone] Fax: [phone]

## 2014-01-21 ENCOUNTER — Ambulatory Visit
Admission: RE | Admit: 2014-01-21 | Discharge: 2014-01-21 | Disposition: A | Discharge status: Home / Self Care | Payer: Medicare HMO | Source: Ambulatory Visit | Attending: Radiation Oncology | Admitting: Radiation Oncology

## 2014-01-21 ENCOUNTER — Encounter: Payer: Self-pay | Admitting: Oncology

## 2014-01-21 ENCOUNTER — Telehealth: Payer: Self-pay

## 2014-01-21 DIAGNOSIS — C50412 Malignant neoplasm of upper-outer quadrant of left female breast: Secondary | ICD-10-CM

## 2014-01-21 DIAGNOSIS — Z510 Encounter for antineoplastic radiation therapy: Secondary | ICD-10-CM | POA: Diagnosis not present

## 2014-01-21 NOTE — Telephone Encounter (Signed)
LMOVM - pt to return call to clinic.   

## 2014-01-21 NOTE — Progress Notes (Signed)
Name: SMT. LODER   MRN: [id number]  Date:  01/21/2014  DOB: 1955/07/31  Status:outpatient    DIAGNOSIS: Breast cancer (bilateral)  CONSENT VERIFIED: yes   SET UP: Patient is setup supine   IMMOBILIZATION:  The following immobilization was used:Custom Moldable Pillow, breast board.   NARRATIVE: Ms. Bakula was brought to the [facility].  Identity was confirmed.  All relevant records and images related to the planned course of therapy were reviewed.  Then, the patient was positioned in a stable reproducible clinical set-up for radiation therapy.  Wires were placed to delineate the clinical extent of breast tissue. A wire was placed on the scar as well.  CT images were obtained.  An isocenter was placed first on the left and then on the right. Skin markings were placed.  The CT images were loaded into the planning software where the target and avoidance structures were contoured.  The radiation prescription was entered and confirmed. The patient was discharged in stable condition and tolerated simulation well.    TREATMENT PLANNING NOTE:  Treatment planning then occurred. I have requested : MLC's, isodose plan, basic dose calculation  I personally designed and supervised the construction of 4 medically necessary complex treatment devices for the protection of critical normal structures including the lungs and contralateral breast as well as the immobilization device which is necessary for set up certainty.   TREATMENT PLANNING NOTE/3D Simulation Note Treatment planning then occurred. I have requested : MLC's, isodose plan, basic dose calculation  3D simulation was performed.  I personally constructed 9 complex treatment devices in the form of MLCs which will be used for beam modification and to protect critical structures including the heart and lung.  I have requested a dose volume histogram of the heart lung and tumor cavity.

## 2014-01-21 NOTE — Progress Notes (Signed)
Received letter from [employer].  Pt is approved for a guaranteed award of [phone] and the remaining award balance of $3,350 is accessible on a first-come first-served basis as long as funding remains available in the breast cancer fund.  The award is valid for a period of 12 months from 01/10/14.  They will go back 6 months.  I will email copy of letter to [facility] in billing.

## 2014-01-23 DIAGNOSIS — Z510 Encounter for antineoplastic radiation therapy: Secondary | ICD-10-CM | POA: Diagnosis not present

## 2014-01-24 DIAGNOSIS — Z510 Encounter for antineoplastic radiation therapy: Secondary | ICD-10-CM | POA: Diagnosis not present

## 2014-01-29 ENCOUNTER — Ambulatory Visit
Admission: RE | Admit: 2014-01-29 | Discharge: 2014-01-29 | Disposition: A | Discharge status: Home / Self Care | Payer: Medicare HMO | Source: Ambulatory Visit | Attending: Radiation Oncology | Admitting: Radiation Oncology

## 2014-01-29 ENCOUNTER — Other Ambulatory Visit: Payer: Self-pay | Admitting: Adult Health

## 2014-01-29 DIAGNOSIS — C50412 Malignant neoplasm of upper-outer quadrant of left female breast: Secondary | ICD-10-CM

## 2014-01-29 DIAGNOSIS — Z510 Encounter for antineoplastic radiation therapy: Secondary | ICD-10-CM | POA: Diagnosis not present

## 2014-01-29 NOTE — Progress Notes (Signed)
  Radiation Oncology         [phone] ________________________________  Name: [name] MRN: [id number]  Date: 01/29/2014  DOB: 04-Jun-1956  Simulation Verification Note  Status: outpatient  NARRATIVE: The patient was brought to the treatment unit and placed in the planned treatment position. The clinical setup was verified. Then port films were obtained and uploaded to the radiation oncology medical record software.  The treatment beams were carefully compared against the planned radiation fields. The position location and shape of the radiation fields was reviewed. The targeted volume of tissue appears appropriately covered by the radiation beams. Organs at risk appear to be excluded as planned.  Based on my personal review, I approved the simulation verification. The patient's treatment will proceed as planned.  ------------------------------------------------  Thea Silversmith, MD

## 2014-01-30 ENCOUNTER — Ambulatory Visit
Admission: RE | Admit: 2014-01-30 | Discharge: 2014-01-30 | Disposition: A | Discharge status: Home / Self Care | Payer: Medicare HMO | Source: Ambulatory Visit | Attending: Radiation Oncology | Admitting: Radiation Oncology

## 2014-01-30 DIAGNOSIS — Z510 Encounter for antineoplastic radiation therapy: Secondary | ICD-10-CM | POA: Diagnosis not present

## 2014-01-31 ENCOUNTER — Ambulatory Visit
Admission: RE | Admit: 2014-01-31 | Discharge: 2014-01-31 | Disposition: A | Discharge status: Home / Self Care | Payer: Medicare HMO | Source: Ambulatory Visit | Attending: Radiation Oncology | Admitting: Radiation Oncology

## 2014-01-31 ENCOUNTER — Encounter: Payer: Self-pay | Admitting: *Deleted

## 2014-01-31 DIAGNOSIS — Z510 Encounter for antineoplastic radiation therapy: Secondary | ICD-10-CM | POA: Diagnosis not present

## 2014-01-31 NOTE — Progress Notes (Signed)
[place] Work  Clinical Social Work was referred by patient for assessment of psychosocial needs due to concerns with transportation.  Clinical Social Worker met with patient at [facility] to offer support and assess for needs.  Pt reports she has a family member that can bring her and gas cards, but feel it is a hardship for this family member to bring her at her current appointment time. Pt not interested in changing her current appt time at this time.CSW discussed Development worker, community and [facility] and pt plans to call. Pt also reports concerns with housing and has applied for Section 8 housing assistance. She is currently on the waiting list and was hopeful this CSW could assist further. CSW explained to pt that Section 8/HUD is what CSW would have recommended at this time. Pt agrees to call CSW if she continues to have issues with transportation or needs help arranging after contacting [employer], [employer].   Clinical Social Work interventions: Psychiatric nurse Education Supportive Listening  Loren Racer, LCSW Clinical Social Worker [facility] [place] [facility] [place] Wednesday, Thursday and Friday Phone: [phone] Fax: [phone]

## 2014-02-03 ENCOUNTER — Encounter: Payer: Self-pay | Admitting: Radiation Oncology

## 2014-02-03 ENCOUNTER — Ambulatory Visit
Admission: RE | Admit: 2014-02-03 | Discharge: 2014-02-03 | Disposition: A | Discharge status: Home / Self Care | Payer: Medicare HMO | Source: Ambulatory Visit | Attending: Radiation Oncology | Admitting: Radiation Oncology

## 2014-02-03 DIAGNOSIS — Z510 Encounter for antineoplastic radiation therapy: Secondary | ICD-10-CM | POA: Diagnosis not present

## 2014-02-04 ENCOUNTER — Ambulatory Visit
Admission: RE | Admit: 2014-02-04 | Discharge: 2014-02-04 | Disposition: A | Discharge status: Home / Self Care | Payer: Medicare HMO | Source: Ambulatory Visit | Attending: Radiation Oncology | Admitting: Radiation Oncology

## 2014-02-04 VITALS — BP 154/83 | HR 81 | Temp 98.0°F | Wt 197.4 lb

## 2014-02-04 DIAGNOSIS — C50412 Malignant neoplasm of upper-outer quadrant of left female breast: Secondary | ICD-10-CM

## 2014-02-04 DIAGNOSIS — Z510 Encounter for antineoplastic radiation therapy: Secondary | ICD-10-CM | POA: Diagnosis not present

## 2014-02-04 NOTE — Addendum Note (Signed)
Encounter addended by: Marily Konczal Marie Arihaan Bellucci, RN on: 02/04/2014  4:41 PM<BR>     Documentation filed: Chief Complaint Section

## 2014-02-04 NOTE — Progress Notes (Signed)
Weekly assessment of radiation to left chest wall and right breast.Routine of clinic reviewed and patient education performed.Given alra deodorant,radiaplex and Radiation Therapy and You Booklet.Explained to apply gel twice daily after treatment and at bedtime.Not to wear bra prosthesis when at home.Pain of left chest wall over mastectomy area.No visible swelling.

## 2014-02-04 NOTE — Progress Notes (Signed)
Weekly Management Note Current Dose: 7.2  Gy  Projected Dose: 60.4 Gy (left chest wall), 61 gy to right breast  Narrative:  The patient presents for routine under treatment assessment.  CBCT/MVCT images/Port film x-rays were reviewed.  The chart was checked. RN education performed.   Physical Findings: Weight: 197 lb 6.4 oz (89.54 kg). Unchanged  Impression:  The patient is tolerating radiation.  Plan:  Continue treatment as planned. Start radiaplex.

## 2014-02-05 ENCOUNTER — Ambulatory Visit: Admission: RE | Admit: 2014-02-05 | Payer: Medicare HMO | Source: Ambulatory Visit

## 2014-02-06 ENCOUNTER — Ambulatory Visit
Admission: RE | Admit: 2014-02-06 | Discharge: 2014-02-06 | Disposition: A | Discharge status: Home / Self Care | Payer: Medicare HMO | Source: Ambulatory Visit | Attending: Radiation Oncology | Admitting: Radiation Oncology

## 2014-02-06 DIAGNOSIS — Z510 Encounter for antineoplastic radiation therapy: Secondary | ICD-10-CM | POA: Diagnosis not present

## 2014-02-07 ENCOUNTER — Ambulatory Visit
Admission: RE | Admit: 2014-02-07 | Discharge: 2014-02-07 | Disposition: A | Discharge status: Home / Self Care | Payer: Medicare HMO | Source: Ambulatory Visit | Attending: Radiation Oncology | Admitting: Radiation Oncology

## 2014-02-07 DIAGNOSIS — Z510 Encounter for antineoplastic radiation therapy: Secondary | ICD-10-CM | POA: Diagnosis not present

## 2014-02-10 ENCOUNTER — Ambulatory Visit
Admission: RE | Admit: 2014-02-10 | Discharge: 2014-02-10 | Disposition: A | Discharge status: Home / Self Care | Payer: Medicare HMO | Source: Ambulatory Visit | Attending: Radiation Oncology | Admitting: Radiation Oncology

## 2014-02-10 DIAGNOSIS — Z510 Encounter for antineoplastic radiation therapy: Secondary | ICD-10-CM | POA: Diagnosis not present

## 2014-02-11 ENCOUNTER — Ambulatory Visit
Admission: RE | Admit: 2014-02-11 | Discharge: 2014-02-11 | Disposition: A | Discharge status: Home / Self Care | Payer: Medicare HMO | Source: Ambulatory Visit | Attending: Radiation Oncology | Admitting: Radiation Oncology

## 2014-02-11 VITALS — BP 171/81 | HR 85 | Temp 98.0°F | Wt 195.2 lb

## 2014-02-11 DIAGNOSIS — C50412 Malignant neoplasm of upper-outer quadrant of left female breast: Secondary | ICD-10-CM

## 2014-02-11 DIAGNOSIS — Z510 Encounter for antineoplastic radiation therapy: Secondary | ICD-10-CM | POA: Diagnosis not present

## 2014-02-11 NOTE — Progress Notes (Deleted)
  Radiation Oncology         [phone] ________________________________  Name: Kathy Maxwell MRN: [id number]  Date: 02/11/2014  DOB: January 26, 1956  Weekly Radiation Therapy Management  Breast cancer of upper-outer quadrant of left female breast   Primary site: Breast (Left)   Staging method: AJCC 7th Edition   Pathologic: Stage IIIA (T3, N2, cM0)    Summary: Stage IIIA (T3, N2, cM0)   Prognostic indicators: ER+PR+ her2/neu - ki-67 33%   Current Dose: 14.4 Gy     Planned Dose:  61 Gy  Narrative . . . . . . . . The patient presents for routine under treatment assessment.                                   The patient is without complaint for some mild itching/discomfort in the left chest wall area                                 Set-up films were reviewed.                                 The chart was checked. Physical Findings. . .  weight is 195 lb 3.2 oz (88.542 kg). Her temperature is 98 F (36.7 C). Her blood pressure is 171/81 and her pulse is 85. . The lungs are clear. The heart has regular rhythm and rate. Examination of the left chest wall area reveals some mild hyperpigmentation changes.  Impression . . . . . . . The patient is tolerating radiation. Plan . . . . . . . . . . . . Continue treatment as planned.  ________________________________   Blair Promise, PhD, MD

## 2014-02-11 NOTE — Progress Notes (Signed)
  Radiation Oncology         [phone] ________________________________  Name: Kathy Maxwell MRN: [id number]  Date: 02/11/2014  DOB: July 21, 1955  Weekly Radiation Therapy Management  Diagnosis: Bilateral breast cancer  Current Dose: 14.4 Gy     Planned Dose:  61 Gy  Narrative . . . . . . . . The patient presents for routine under treatment assessment.                                   The patient is without complaint except for some mild discomfort in the chest area                                 Set-up films were reviewed.                                 The chart was checked. Physical Findings. . .  weight is 195 lb 3.2 oz (88.542 kg). Her temperature is 98 F (36.7 C). Her blood pressure is 171/81 and her pulse is 85. . The lungs are clear. The heart has a regular rhythm and rate. The right breast shows mild hyperpigmentation changes. The left chest wall area shows some mild hyperpigmentation changes. Impression . . . . . . . The patient is tolerating radiation. Plan . . . . . . . . . . . . Continue treatment as planned.  ________________________________   Blair Promise, PhD, MD

## 2014-02-11 NOTE — Progress Notes (Signed)
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

## 2014-02-12 ENCOUNTER — Ambulatory Visit
Admission: RE | Admit: 2014-02-12 | Discharge: 2014-02-12 | Disposition: A | Discharge status: Home / Self Care | Payer: Medicare HMO | Source: Ambulatory Visit | Attending: Radiation Oncology | Admitting: Radiation Oncology

## 2014-02-12 ENCOUNTER — Ambulatory Visit (HOSPITAL_BASED_OUTPATIENT_CLINIC_OR_DEPARTMENT_OTHER): Payer: Medicare HMO | Admitting: Oncology

## 2014-02-12 ENCOUNTER — Ambulatory Visit (HOSPITAL_BASED_OUTPATIENT_CLINIC_OR_DEPARTMENT_OTHER): Payer: Medicare HMO

## 2014-02-12 ENCOUNTER — Telehealth: Payer: Self-pay | Admitting: Oncology

## 2014-02-12 VITALS — BP 179/76 | HR 72 | Temp 98.5°F | Resp 18 | Ht 62.0 in | Wt 197.0 lb

## 2014-02-12 DIAGNOSIS — C50419 Malignant neoplasm of upper-outer quadrant of unspecified female breast: Secondary | ICD-10-CM

## 2014-02-12 DIAGNOSIS — Z170 Estrogen receptor positive status [ER+]: Secondary | ICD-10-CM

## 2014-02-12 DIAGNOSIS — I10 Essential (primary) hypertension: Secondary | ICD-10-CM

## 2014-02-12 DIAGNOSIS — Z510 Encounter for antineoplastic radiation therapy: Secondary | ICD-10-CM | POA: Diagnosis not present

## 2014-02-12 DIAGNOSIS — D0591 Unspecified type of carcinoma in situ of right breast: Secondary | ICD-10-CM

## 2014-02-12 DIAGNOSIS — M8580 Other specified disorders of bone density and structure, unspecified site: Secondary | ICD-10-CM

## 2014-02-12 DIAGNOSIS — C50412 Malignant neoplasm of upper-outer quadrant of left female breast: Secondary | ICD-10-CM

## 2014-02-12 DIAGNOSIS — D0590 Unspecified type of carcinoma in situ of unspecified breast: Secondary | ICD-10-CM

## 2014-02-12 LAB — CBC WITH DIFFERENTIAL/PLATELET
BASO%: 0.7 % (ref 0.0–2.0)
Basophils Absolute: 0 10*3/uL (ref 0.0–0.1)
EOS%: 2.2 % (ref 0.0–7.0)
Eosinophils Absolute: 0.1 10*3/uL (ref 0.0–0.5)
HEMATOCRIT: 37.4 % (ref 34.8–46.6)
HGB: 12 g/dL (ref 11.6–15.9)
LYMPH#: 0.7 10*3/uL — AB (ref 0.9–3.3)
LYMPH%: 18.2 % (ref 14.0–49.7)
MCH: 28.8 pg (ref 25.1–34.0)
MCHC: 32.1 g/dL (ref 31.5–36.0)
MCV: 89.7 fL (ref 79.5–101.0)
MONO#: 0.4 10*3/uL (ref 0.1–0.9)
MONO%: 10.6 % (ref 0.0–14.0)
NEUT#: 2.5 10*3/uL (ref 1.5–6.5)
NEUT%: 68.3 % (ref 38.4–76.8)
Platelets: 278 10*3/uL (ref 145–400)
RBC: 4.17 10*6/uL (ref 3.70–5.45)
RDW: 14.8 % — ABNORMAL HIGH (ref 11.2–14.5)
WBC: 3.7 10*3/uL — ABNORMAL LOW (ref 3.9–10.3)

## 2014-02-12 LAB — COMPREHENSIVE METABOLIC PANEL (CC13)
ALT: 6 U/L (ref 0–55)
AST: 15 U/L (ref 5–34)
Albumin: 3.7 g/dL (ref 3.5–5.0)
Alkaline Phosphatase: 104 U/L (ref 40–150)
Anion Gap: 10 meq/L (ref 3–11)
BUN: 10.4 mg/dL (ref 7.0–26.0)
CO2: 24 meq/L (ref 22–29)
Calcium: 9.6 mg/dL (ref 8.4–10.4)
Chloride: 106 meq/L (ref 98–109)
Creatinine: 0.9 mg/dL (ref 0.6–1.1)
Glucose: 168 mg/dL — ABNORMAL HIGH (ref 70–140)
Potassium: 3.6 meq/L (ref 3.5–5.1)
Sodium: 140 meq/L (ref 136–145)
Total Bilirubin: 0.4 mg/dL (ref 0.20–1.20)
Total Protein: 7 g/dL (ref 6.4–8.3)

## 2014-02-12 NOTE — Progress Notes (Signed)
[place]  [phone] [phone]) [phone]     ID: Kathy Maxwell DOB: 03/31/1956  MR#: [id number]  FUX#:[id number]  Patient Care Team: Glendale Chard, MD as PCP - General (Internal Medicine) Adin Hector, MD as Consulting Physician (General Surgery) Thea Silversmith, MD as Consulting Physician (Radiation Oncology)  CHIEF COMPLAINT: stage IIIA, estrogen receptor positive breast cancer  CURRENT TREATMENT: radiation   BREAST CANCER HISTORY: From Dr. Dana Allan original intake note, 04/04/2013:  "Kathy Maxwell is a 58 y.o. female. With other medical problems who underwent a screening mammogram that led to a diagnostic mammogram and ultrasound of the left breast. The mammogram and ultrasound showed a 2.9 x 1.6 x 1.9 cm suspicious mass in the left breast at the 12:30 oh clock position 6 cm from the nipple. There was also a suspicious left axillary lymph node. She had image guided biopsy of the left breast mass and the lymph node. The pathology revealed an invasive mammary carcinoma probably ductal phenotype. Tumor was estrogen receptor +100% progesterone receptor +98% proliferation marker Ki-67 13% and HER-2/neu negative. Patient had MRI of the breasts performed on 03/15/2013 the left breast revealed a 7.4 cm area of what the lymph node. Right breast revealed and a 3.3 cm mass. Patient was seen by Dr. Fanny Skates for discussion of surgical options. He has referred the patient to medical oncology for discussion whether or not she will need a Port-A-Cath and chemotherapy."  The patient underwent left modified radical mastectomy 04/22/2014 showing multiple foci of low-grade invasive ductal carcinoma, the largest single lesion measuring 5.5 cm, and involving 4/22 lymph nodes. Repeat HER-2 study was again negative. 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 The patient proceeded to adjuvant chemotherapy completed May 2015 as summarized below. She is currently receiving radiation therapy.  The patient's subsequent history is detailed below.  INTERVAL HISTORY: Kathy Maxwell returns today for followup of her stage III breast cancer. She is currently receiving adjuvant radiation. She is establishing herself in my service today.  REVIEW OF SYSTEMS: She is doing well with the radiation, with no significant fatigue or skin changes. She denies unusual headaches, visual changes, nausea, vomiting, dizziness, or gait imbalance. She has a history of bipolar disorder and diabetes but tells me these are both well controlled currently. A detailed review of systems today was otherwise entirely negative  PAST MEDICAL HISTORY: Past Medical History  Diagnosis Date  . Hyperlipidemia   . Hypertension   . Bipolar 1 disorder   . Cancer   . Hypertension 04/04/2013  . Hyperlipidemia 04/04/2013  . Bipolar 1 disorder 04/04/2013  . Breast cancer 03/10/13    left breast invasive ca  . Allergy     latex  . Obesity   . Diabetes mellitus without complication     NIDDM  . Sleep apnea     no cpap due to insurance  . Heart murmur   . GERD (gastroesophageal reflux disease)   . Coronary artery disease     PAST SURGICAL HISTORY: Past Surgical History  Procedure Laterality Date  . Cholecystectomy    . Breast biopsy Left 05/08/09    fibroadenoma with microcalcifications,no malignancy id  . Breast biopsy Left 03/07/13    invasive ca,1 lymph  node metastatic  . 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 Abdominal hysterectomy    . Coronary stent placement  2001  . Cardiac catheterization    . Eye surgery  6/13,9/14    laser,cataract lft  . Coronary angioplasty      LAD stent 2003   . Portacath placement Right 04/22/2013    Procedure: INSERTION PORT-A-CATH;  Surgeon: Adin Hector, MD;  Location: [facility];  Service: General;  Laterality: Right;  . Mastectomy modified radical Left 04/22/2013    Procedure: MASTECTOMY MODIFIED RADICAL;  Surgeon: Adin Hector, MD;  Location: [facility];  Service: General;  Laterality: Left;  . Breast lumpectomy with needle localization and axillary sentinel lymph node bx Right 04/22/2013    Procedure: BREAST LUMPECTOMY WITH NEEDLE LOCALIZATION AND AXILLARY SENTINEL LYMPH NODE BX;  Surgeon: Adin Hector, MD;  Location: [facility];  Service: General;  Laterality: Right;  . Esophagogastroduodenoscopy N/A 08/07/2013    Procedure: ESOPHAGOGASTRODUODENOSCOPY (EGD);  Surgeon: Missy Sabins, MD;  Location: [facility];  Service: Endoscopy;  Laterality: N/A;  . Esophagogastroduodenoscopy N/A 11/13/2013    Procedure: ESOPHAGOGASTRODUODENOSCOPY (EGD);  Surgeon: Wonda Horner, MD;  Location: [facility];  Service: Endoscopy;  Laterality: N/A;    FAMILY HISTORY Family History  Problem Relation Age of Onset  . Heart disease Mother   . Breast cancer Maternal Aunt     dx in her 80s  . Heart disease Cousin 37    maternal cousin  . Heart attack Sister 73  . Cancer Paternal Aunt     NOS  . Cancer Maternal Aunt     NOS  . Lung cancer Cousin 63    maternal cousin - non smoker  . Brain cancer Cousin 13    maternal cousin's son  . Lung cancer Cousin 51    maternal cousin's son  . Breast cancer Cousin     paternal cousin   the patient's father died from alcoholic cirrhosis at the age of 3. The patient's mother died from congestive heart failure the age of 20. The patient had no brothers, 4 sisters. One sister died from a myocardial infarction at the age of 31. The only other breast cancers in the family are a maternal aunt(Otto 15) with breast cancer diagnosed after the age of 65 and a second cousin on the mother's side, diagnosed with breast cancer  approximately age 64. There is no history of ovarian cancer in the family. The patient has undergone genetic testing and his BRCA negative  GYNECOLOGIC HISTORY:  No LMP recorded. Patient is postmenopausal. Menarche age 71. The patient is GX P0. She underwent hysterectomy with bilateral salpingo-oophorectomy in 1997. She took hormone replacement briefly.  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 She is single. Currently she shares a home with a friend, but is looking for her own place. She attends a local church of [place]: Not in place. At her 02/12/2014 visit the patient was given the appropriate documents to complete and notarize at her discretion. She tells me she intends to name her goddaughter,, Gerlene Burdock, who is a CNA, as her healthcare power of attorney. Kathy Maxwell can be reached at [place]: History  Substance Use Topics  . Smoking status: Former Smoker -- 1.00 packs/day for 15 years    Types: Cigarettes    Quit date: 07/19/1987  . Smokeless  tobacco: Never Used  . Alcohol Use: No     Colonoscopy: Never  PAP:  Bone density: Never  Lipid panel:  Allergies  Allergen Reactions  . Cephalexin Other (See Comments)    Burned throat.  . Adhesive [Tape] Rash    Clear tape gloves ,elastic no problem  . Latex Hives and Rash    Current Outpatient Prescriptions  Medication Sig Dispense Refill  . amLODipine (NORVASC) 10 MG tablet Take 10 mg by mouth daily.      [name] b complex vitamins tablet Take 1 tablet by mouth daily.      [name] buPROPion (WELLBUTRIN SR) 150 MG 12 hr tablet Take 1 tablet (150 mg total) by mouth daily.  30 tablet  6  . Cholecalciferol (VITAMIN D PO) Take 1 tablet by mouth daily.      . fluticasone (FLONASE) 50 MCG/ACT nasal spray Place 1 spray into both nostrils daily.  9.9 g  0  . furosemide (LASIX) 40 MG tablet Take 1 tablet (40 mg total) by mouth daily.  30 tablet  0    . gabapentin (NEURONTIN) 100 MG capsule Take 3 capsules (300 mg total) by mouth 3 (three) times daily.  870 capsule  6  . hydrALAZINE (APRESOLINE) 25 MG tablet Take 50 mg by mouth 3 (three) times daily.       [name] HYDROcodone-acetaminophen (NORCO/VICODIN) 5-325 MG per tablet Take 1-2 tablets by mouth every 4 (four) hours as needed for moderate pain.  30 tablet  0  . HYDROcodone-acetaminophen (NORCO/VICODIN) 5-325 MG per tablet Take 1 tablet by mouth every 4 (four) hours as needed.  10 tablet  0  . lidocaine-prilocaine (EMLA) cream Apply topically as needed. Apply to port-a-cath one - two hours before accessing.  30 g  3  . LORazepam (ATIVAN) 0.5 MG tablet Take 1 tablet (0.$RemoveBef'5mg'ETlziLTKcb$ ) every 6 hours as needed for nausea or vomiting.  30 tablet  2  . losartan (COZAAR) 100 MG tablet Take 100 mg by mouth daily.      . metoprolol succinate (TOPROL-XL) 25 MG 24 hr tablet Take 25 mg by mouth daily.       . ondansetron (ZOFRAN) 4 MG tablet Take 1 tablet (4 mg total) by mouth every 6 (six) hours.  12 tablet  0  . pantoprazole (PROTONIX) 40 MG tablet Take 1 tablet (40 mg total) by mouth daily.  30 tablet  3  . pioglitazone-metformin (ACTOPLUS MET) 15-850 MG per tablet Take 1 tablet by mouth every morning.       . potassium chloride SA (K-DUR,KLOR-CON) 20 MEQ tablet Take 1 tablet (20 mEq total) by mouth 2 (two) times daily.  60 tablet  0  . rosuvastatin (CRESTOR) 5 MG tablet Take 5 mg by mouth daily.      . sitaGLIPtin (JANUVIA) 100 MG tablet Take 100 mg by mouth daily.       No current facility-administered medications for this visit.   Facility-Administered Medications Ordered in Other Visits  Medication Dose Route Frequency Provider Last Rate Last Dose  . sodium chloride 0.9 % injection 10 mL  10 mL Intravenous PRN [name], MD   10 mL at 05/24/13 1522    OBJECTIVE: Middle-aged woman in no acute distress Filed Vitals:   02/12/14 1055  BP: 179/76  Pulse: 72  Temp: 98.5 F (36.9 C)  Resp: 18      Body mass index is 36.02 kg/(m^2).    ECOG FS:1 - Symptomatic but completely ambulatory  Ocular:  Sclerae unicteric, bilateral arcus senilis Ear-nose-throat: Oropharynx clear and moist Lymphatic: No cervical or supraclavicular adenopathy Lungs no rales or rhonchi, good excursion bilaterally Heart regular rate and rhythm, 2/6 systolic ejection murmur noted Abd soft, nontender, positive bowel sounds MSK no focal spinal tenderness, no upper extremity lymphedema Neuro: non-focal, well-oriented, appropriate affect Breasts: The right breast is status post lumpectomy. There is no evidence of local recurrence. The left breast is status post mastectomy. There is no evidence of local recurrence. Both axillae are benign. There are no significant skin changes from the radiation so far   LAB RESULTS:  CMP     Component Value Date/Time   NA 138 02/13/2014 2308   NA 140 02/12/2014 0921   K 3.5* 02/13/2014 2308   K 3.6 02/12/2014 0921   CL 105 02/13/2014 2308   CO2 22 02/13/2014 2308   CO2 24 02/12/2014 0921   GLUCOSE 99 02/13/2014 2308   GLUCOSE 168* 02/12/2014 0921   BUN 10 02/13/2014 2308   BUN 10.4 02/12/2014 0921   CREATININE 0.70 02/13/2014 2308   CREATININE 0.9 02/12/2014 0921   CALCIUM 9.0 02/13/2014 2308   CALCIUM 9.6 02/12/2014 0921   PROT 6.3 02/13/2014 2308   PROT 7.0 02/12/2014 0921   ALBUMIN 3.4* 02/13/2014 2308   ALBUMIN 3.7 02/12/2014 0921   AST 14 02/13/2014 2308   AST 15 02/12/2014 0921   ALT 8 02/13/2014 2308   ALT 6 02/12/2014 0921   ALKPHOS 92 02/13/2014 2308   ALKPHOS 104 02/12/2014 0921   BILITOT 0.3 02/13/2014 2308   BILITOT 0.40 02/12/2014 0921   GFRNONAA >90 02/13/2014 2308   GFRAA >90 02/13/2014 2308    I No results found for this basename: SPEP,  UPEP,   kappa and lambda light chains    Lab Results  Component Value Date   WBC 4.0 02/13/2014   NEUTROABS 2.4 02/13/2014   HGB 10.9* 02/13/2014   HCT 32.9* 02/13/2014   MCV 88.7 02/13/2014   PLT 243 02/13/2014      Chemistry       Component Value Date/Time   NA 138 02/13/2014 2308   NA 140 02/12/2014 0921   K 3.5* 02/13/2014 2308   K 3.6 02/12/2014 0921   CL 105 02/13/2014 2308   CO2 22 02/13/2014 2308   CO2 24 02/12/2014 0921   BUN 10 02/13/2014 2308   BUN 10.4 02/12/2014 0921   CREATININE 0.70 02/13/2014 2308   CREATININE 0.9 02/12/2014 0921      Component Value Date/Time   CALCIUM 9.0 02/13/2014 2308   CALCIUM 9.6 02/12/2014 0921   ALKPHOS 92 02/13/2014 2308   ALKPHOS 104 02/12/2014 0921   AST 14 02/13/2014 2308   AST 15 02/12/2014 0921   ALT 8 02/13/2014 2308   ALT 6 02/12/2014 0921   BILITOT 0.3 02/13/2014 2308   BILITOT 0.40 02/12/2014 0921       No results found for this basename: LABCA2    No components found with this basename: LABCA125    No results found for this basename: INR,  in the last 168 hours  Urinalysis    Component Value Date/Time   COLORURINE YELLOW 11/11/2013 1054   APPEARANCEUR CLOUDY* 11/11/2013 1054   LABSPEC 1.027 11/11/2013 1054   PHURINE 5.5 11/11/2013 1054   GLUCOSEU >1000* 11/11/2013 1054   HGBUR TRACE* 11/11/2013 1054   BILIRUBINUR SMALL* 11/11/2013 1054   KETONESUR 15* 11/11/2013 1054   PROTEINUR >300* 11/11/2013 1054   UROBILINOGEN 1.0 11/11/2013 1054  NITRITE NEGATIVE 11/11/2013 1054   LEUKOCYTESUR SMALL* 11/11/2013 1054    STUDIES: Ct Head Wo Contrast  02/13/2014   CLINICAL DATA:  Numbness and tingling in the left arm. Headache. Vertigo.  EXAM: CT HEAD WITHOUT CONTRAST  TECHNIQUE: Contiguous axial images were obtained from the base of the skull through the vertex without intravenous contrast.  COMPARISON:  Report of brain MRI dated 01/30/2002 and 11/20/2001  FINDINGS: There is no acute intracranial hemorrhage, infarction, or mass lesion. There is a tiny area of subcortical white matter lucency in the right posterior frontal region consistent with small vessel ischemic disease.  There is a 10 mm lytic lesion in the left posterior parietal bone which was described on the prior MRIs.  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 No other is abnormalities.  IMPRESSION: No acute intracranial abnormality. Small focal area of lucency high in the right posterior frontal region, probably small vessel ischemic disease.   Electronically Signed   By: Rozetta Nunnery M.D.   On: 02/13/2014 22:36   Mr Brain Wo Contrast  02/14/2014   CLINICAL DATA:  HEADACHE LUE weakness, breast cancer HEADACHE LUE weakness, breast cancer  EXAM: MRI HEAD WITHOUT CONTRAST  TECHNIQUE: Multiplanar, multiecho pulse sequences of the brain and surrounding structures were obtained without intravenous contrast.  COMPARISON:  Prior CT from earlier the same day as well as MRI report from 01/30/2002.  FINDINGS: The CSF containing spaces are within normal limits for patient age. Patchy T2/FLAIR hyperintensity within the periventricular and supratentorial white matter noted, nonspecific, but likely related to moderate chronic microvascular ischemic disease. Demyelinating disease could also be considered.  No mass lesion, midline shift, or extra-axial fluid collection. Ventricles are normal in size without evidence of hydrocephalus.  No diffusion-weighted signal abnormality is identified to suggest acute intracranial infarct. Gray-white matter differentiation is maintained. Normal flow voids are seen within the intracranial vasculature. No intracranial hemorrhage identified. Probable remote tiny lacunar infarct noted within the right thalamus.  Mild cerebellar tonsillar ectopia of approximately 5 mm noted. Pituitary gland is within normal limits. Pituitary stalk is midline. The globes and optic nerves demonstrate a normal appearance with normal signal intensity.  1.3 cm T1 hyperintense well-circumscribed lesion within the left parietal calvarium noted, likely benign in nature. Bone marrow signal intensity is otherwise unremarkable. Visualized upper cervical spine is within normal limits.  Scalp soft tissues are unremarkable.   Paranasal sinuses are clear.  No mastoid effusion.  IMPRESSION: 1. No acute intracranial infarct or other abnormality identified. 2. Patchy in T2/FLAIR hyperintensity within the periventricular and deep white matter of both cerebral hemispheres, nonspecific. Findings may be related to chronic microvascular ischemic disease. Demyelinating disease could also be considered in the correct clinical setting. 3. Subcentimeter remote lacunar infarct within the right thalamus. 4. 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   Electronically Signed   By: Jeannine Boga M.D.   On: 02/14/2014 01:16    ASSESSMENT: 58 y.o. BRCA negative [place] woman  LEFT BREAST (1) status post left breast upper outer quadrant and left axillary lymph node biopsy 03/07/2013, both positive for an invasive ductal carcinoma, grade 1 or 2, estrogen receptor and progesterone receptor both strongly positive, HER-2 negative, with an MIB-1 between 13% and 15%  (2) biopsy of a more anterior mass in the left breast 04/03/2013 showed invasive ductal carcinoma, grade 2, estrogen and progesterone receptor positive, HER-2 negative, with an MIB-1 of 33%  (3) status post left modified radical mastectomy 04/22/2013 for mpT3 pN2, stage  IIIA invasive ductal carcinoma, grade 1, repeat HER-2 again negative; 4 of 22 lymph nodes positive; ample margins   RIGHT BREAST (4) status post right central breast biopsy 04/03/2013 for ductal carcinoma in situ, 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% positive, progesterone receptor 11% positive   (5) right lumpectomy and sentinel lymph node sampling 04/22/2013 showed ductal carcinoma in situ, 3 cm, margins less than 1 mm medially  ADJUVANT CHEMOTHERAPY (6) cyclophosphamide, epirubicin, and fluorouracil given every 14 days x6 cycles completed 08/02/2013  (7) weekly paclitaxel x2, discontinued 08/23/2013, with poor tolerance   (8) Abraxane given day 1 day 8 of each 21 day cycle, with some delays, for a total  8 doses, completed 11/23/2023  ADJUVANT RADIATION (9) adjuvant radiation therapy to be completed 03/18/2014  ANTI-ESTROGEN THERAPY (10) to start anastrozole on completion of radiation; dexa scan 04/03/2014  GENETIC TESTING (11) a broad genetic panel since December 2014 showed a variant of uncertain significance, 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  there were no deleterious mutations noted in the gene suggested, which included ATM, BARD1, BRCA1, BRCA2, BRIP1, CDH1, CHEK2, EPCAM, FANCC, MLH1, MSH2, MSH6, NBN, PALB2, PMS2, PTEN, RAD51C, RAD51D, STK11, TP53, and XRCC2.    PLAN: We spent the better part of today's hour-long appointment discussing the biology of breast cancer in general, and the specifics of the patient's tumor in particular. Kathy Maxwell understands the difference between local and systemic therapy for breast cancer, and she will be completing her local treatment early September, when she finishes her radiation. As far as systemic therapy is concerned, she has received aggressive chemotherapy, and generally bad reduces the risk of systemic recurrence by approximately 1/3.  Once she completes radiation, she will be ready to start antiestrogen therapy. That will reduce her risk of distant recurrence by one half. Of course it also further reduces her risk of local recurrence. She is status post hysterectomy and bilateral salpingo-oophorectomy. We are going to obtain a DEXA scan for a baseline. Tentatively I would prefer to start with anastrozole and we will discuss the possible toxicities, side effects and complications of that agent when the patient returns to see me in late September. She should have sufficiently recovered from her radiation treatments at that time that she would not confuse some other radiation side effects with those of anastrozole.  The patient has a good understanding of the overall plan. She agrees with it. She knows the goal of treatment in her case is cure. She will call with any problems  that may develop before her next visit here.  Chauncey Cruel, MD   02/16/2014 8:33 AM  Addendum: The day after the visit just documented the patient presented to the emergency room with a severe headache and complaints of left upper extremity weakness. Head CT) MRI showed no evidence of metastatic disease to the brain.

## 2014-02-12 NOTE — Telephone Encounter (Signed)
, °

## 2014-02-13 ENCOUNTER — Encounter (HOSPITAL_COMMUNITY): Payer: Self-pay | Admitting: Emergency Medicine

## 2014-02-13 ENCOUNTER — Other Ambulatory Visit: Payer: Self-pay

## 2014-02-13 ENCOUNTER — Ambulatory Visit
Admission: RE | Admit: 2014-02-13 | Discharge: 2014-02-13 | Disposition: A | Discharge status: Home / Self Care | Payer: Medicare HMO | Source: Ambulatory Visit | Attending: Radiation Oncology | Admitting: Radiation Oncology

## 2014-02-13 ENCOUNTER — Emergency Department (HOSPITAL_COMMUNITY)
Admission: EM | Admit: 2014-02-13 | Discharge: 2014-02-14 | Disposition: A | Discharge status: Home / Self Care | Payer: Medicare HMO | Attending: Emergency Medicine | Admitting: Emergency Medicine

## 2014-02-13 ENCOUNTER — Emergency Department (HOSPITAL_COMMUNITY): Payer: Medicare HMO

## 2014-02-13 DIAGNOSIS — E119 Type 2 diabetes mellitus without complications: Secondary | ICD-10-CM | POA: Insufficient documentation

## 2014-02-13 DIAGNOSIS — R011 Cardiac murmur, unspecified: Secondary | ICD-10-CM | POA: Insufficient documentation

## 2014-02-13 DIAGNOSIS — Z853 Personal history of malignant neoplasm of breast: Secondary | ICD-10-CM | POA: Diagnosis not present

## 2014-02-13 DIAGNOSIS — Z91040 Latex allergy status: Secondary | ICD-10-CM | POA: Diagnosis not present

## 2014-02-13 DIAGNOSIS — I10 Essential (primary) hypertension: Secondary | ICD-10-CM | POA: Diagnosis not present

## 2014-02-13 DIAGNOSIS — I2510 Atherosclerotic heart disease of native coronary artery without angina pectoris: Secondary | ICD-10-CM | POA: Insufficient documentation

## 2014-02-13 DIAGNOSIS — E785 Hyperlipidemia, unspecified: Secondary | ICD-10-CM | POA: Insufficient documentation

## 2014-02-13 DIAGNOSIS — R51 Headache: Secondary | ICD-10-CM | POA: Diagnosis present

## 2014-02-13 DIAGNOSIS — E669 Obesity, unspecified: Secondary | ICD-10-CM | POA: Insufficient documentation

## 2014-02-13 DIAGNOSIS — IMO0002 Reserved for concepts with insufficient information to code with codable children: Secondary | ICD-10-CM | POA: Insufficient documentation

## 2014-02-13 DIAGNOSIS — Z87891 Personal history of nicotine dependence: Secondary | ICD-10-CM | POA: Diagnosis not present

## 2014-02-13 DIAGNOSIS — K219 Gastro-esophageal reflux disease without esophagitis: Secondary | ICD-10-CM | POA: Insufficient documentation

## 2014-02-13 DIAGNOSIS — Z510 Encounter for antineoplastic radiation therapy: Secondary | ICD-10-CM | POA: Diagnosis not present

## 2014-02-13 DIAGNOSIS — M542 Cervicalgia: Secondary | ICD-10-CM | POA: Insufficient documentation

## 2014-02-13 DIAGNOSIS — Z9861 Coronary angioplasty status: Secondary | ICD-10-CM | POA: Diagnosis not present

## 2014-02-13 DIAGNOSIS — F319 Bipolar disorder, unspecified: Secondary | ICD-10-CM | POA: Diagnosis not present

## 2014-02-13 DIAGNOSIS — Z79899 Other long term (current) drug therapy: Secondary | ICD-10-CM | POA: Diagnosis not present

## 2014-02-13 DIAGNOSIS — R519 Headache, unspecified: Secondary | ICD-10-CM

## 2014-02-13 LAB — CBC WITH DIFFERENTIAL/PLATELET
BASOS ABS: 0 10*3/uL (ref 0.0–0.1)
BASOS PCT: 0 % (ref 0–1)
EOS PCT: 4 % (ref 0–5)
Eosinophils Absolute: 0.2 10*3/uL (ref 0.0–0.7)
HCT: 32.9 % — ABNORMAL LOW (ref 36.0–46.0)
Hemoglobin: 10.9 g/dL — ABNORMAL LOW (ref 12.0–15.0)
Lymphocytes Relative: 22 % (ref 12–46)
Lymphs Abs: 0.9 10*3/uL (ref 0.7–4.0)
MCH: 29.4 pg (ref 26.0–34.0)
MCHC: 33.1 g/dL (ref 30.0–36.0)
MCV: 88.7 fL (ref 78.0–100.0)
MONO ABS: 0.5 10*3/uL (ref 0.1–1.0)
Monocytes Relative: 14 % — ABNORMAL HIGH (ref 3–12)
Neutro Abs: 2.4 10*3/uL (ref 1.7–7.7)
Neutrophils Relative %: 60 % (ref 43–77)
Platelets: 243 10*3/uL (ref 150–400)
RBC: 3.71 MIL/uL — ABNORMAL LOW (ref 3.87–5.11)
RDW: 13.8 % (ref 11.5–15.5)
WBC: 4 10*3/uL (ref 4.0–10.5)

## 2014-02-13 LAB — COMPREHENSIVE METABOLIC PANEL
ALBUMIN: 3.4 g/dL — AB (ref 3.5–5.2)
ALT: 8 U/L (ref 0–35)
ANION GAP: 11 (ref 5–15)
AST: 14 U/L (ref 0–37)
Alkaline Phosphatase: 92 U/L (ref 39–117)
BUN: 10 mg/dL (ref 6–23)
CALCIUM: 9 mg/dL (ref 8.4–10.5)
CO2: 22 meq/L (ref 19–32)
CREATININE: 0.7 mg/dL (ref 0.50–1.10)
Chloride: 105 mEq/L (ref 96–112)
GFR calc Af Amer: >90 mL/min (ref >90)
Glucose, Bld: 99 mg/dL (ref 70–99)
Potassium: 3.5 mEq/L — ABNORMAL LOW (ref 3.7–5.3)
Sodium: 138 mEq/L (ref 137–147)
Total Bilirubin: 0.3 mg/dL (ref 0.3–1.2)
Total Protein: 6.3 g/dL (ref 6.0–8.3)

## 2014-02-13 MED ORDER — FENTANYL CITRATE 0.05 MG/ML IJ SOLN
50.0000 ug | Freq: Once | INTRAMUSCULAR | Status: AC
  Administered 2014-02-13: 50 ug via INTRAVENOUS
  Filled 2014-02-13: qty 2

## 2014-02-13 MED ORDER — METOCLOPRAMIDE HCL 5 MG/ML IJ SOLN
10.0000 mg | Freq: Once | INTRAMUSCULAR | Status: AC
  Administered 2014-02-13: 10 mg via INTRAVENOUS
  Filled 2014-02-13: qty 2

## 2014-02-13 MED ORDER — ONDANSETRON HCL 4 MG/2ML IJ SOLN
4.0000 mg | Freq: Once | INTRAMUSCULAR | Status: AC
  Administered 2014-02-13: 4 mg via INTRAVENOUS
  Filled 2014-02-13: qty 2

## 2014-02-13 NOTE — ED Notes (Signed)
Patient returned from CT

## 2014-02-13 NOTE — ED Notes (Signed)
Per EMS, Patient was trying to sleep and started to have numbness and tingling in the left arm. Patient has a history of Neuropathy, Breast Cancer, and Chemotherapy. Patient has had radiation everyday since chemo ended three weeks ago. Patient states the sensation in new in her left arm. Complains of generalized pounding headache 8/10. Vitals per EMS 166/70, 121 CBG, 80 HR. Patient is allergic to Latex and Cipro.

## 2014-02-13 NOTE — ED Provider Notes (Signed)
CSN: [id number]     Arrival date & time 02/13/14  1957 History   First MD Initiated Contact with Patient 02/13/14 2117     Chief Complaint  Patient presents with  . Headache  . Extremity Weakness     HPI  Patient presents with headache. The headache this morning. Has persisted today. Has had pain in her left arm. Also states her left arm one point felt weak. Headache is generalized. Primarily biparietal. Has not had a frank disuse of the upper extremity. Also has pain in the left neck. No trauma. Has not been febrile. No neck stiffness. History of bilateral breast cancer status post bilateral mastectomy. Completed chemotherapy in May and has been getting relation therapy since that time. Head CAT scan and 2014 that did not show spread outside of the breast. History of headaches. Never had a headache that gave her similar symptoms. States she has vertigo today and has had vertigo for the last 4 years. She states she simply lives with this. She tried Antivert states she did not tolerate it.  Past Medical History  Diagnosis Date  . Hyperlipidemia   . Hypertension   . Bipolar 1 disorder   . Cancer   . Hypertension 04/04/2013  . Hyperlipidemia 04/04/2013  . Bipolar 1 disorder 04/04/2013  . Breast cancer 03/10/13    left breast invasive ca  . Allergy     latex  . Obesity   . Diabetes mellitus without complication     NIDDM  . Sleep apnea     no cpap due to insurance  . Heart murmur   . GERD (gastroesophageal reflux disease)   . Coronary artery disease    Past Surgical History  Procedure Laterality Date  . Cholecystectomy    . Breast biopsy Left 05/08/09    fibroadenoma with microcalcifications,no malignancy id  . Breast biopsy Left 03/07/13    invasive ca,1 lymph node metastatic  . 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 Abdominal hysterectomy    . Coronary stent placement  2001  . Cardiac catheterization    . Eye surgery   6/13,9/14    laser,cataract lft  . Coronary angioplasty      LAD stent 2003  . Portacath placement Right 04/22/2013    Procedure: INSERTION PORT-A-CATH;  Surgeon: Adin Hector, MD;  Location: [facility];  Service: General;  Laterality: Right;  . Mastectomy modified radical Left 04/22/2013    Procedure: MASTECTOMY MODIFIED RADICAL;  Surgeon: Adin Hector, MD;  Location: [facility];  Service: General;  Laterality: Left;  . Breast lumpectomy with needle localization and axillary sentinel lymph node bx Right 04/22/2013    Procedure: BREAST LUMPECTOMY WITH NEEDLE LOCALIZATION AND AXILLARY SENTINEL LYMPH NODE BX;  Surgeon: Adin Hector, MD;  Location: [facility];  Service: General;  Laterality: Right;  . Esophagogastroduodenoscopy N/A 08/07/2013    Procedure: ESOPHAGOGASTRODUODENOSCOPY (EGD);  Surgeon: Missy Sabins, MD;  Location: [facility];  Service: Endoscopy;  Laterality: N/A;  . Esophagogastroduodenoscopy N/A 11/13/2013    Procedure: ESOPHAGOGASTRODUODENOSCOPY (EGD);  Surgeon: Wonda Horner, MD;  Location: [facility];  Service: Endoscopy;  Laterality: N/A;   Family History  Problem Relation Age of Onset  . Heart disease Mother   . Breast cancer Maternal Aunt     dx in her 84s  . Heart disease Cousin 52    maternal cousin  . Heart attack Sister 78  . Cancer Paternal Aunt  NOS  . Cancer Maternal Aunt     NOS  . Lung cancer Cousin 22    maternal cousin - non smoker  . Brain cancer Cousin 13    maternal cousin's son  . Lung cancer Cousin 12    maternal cousin's son  . Breast cancer Cousin     paternal cousin   History  Substance Use Topics  . Smoking status: Former Smoker -- 1.00 packs/day for 15 years    Types: Cigarettes    Quit date: 07/19/1987  . Smokeless tobacco: Never Used  . Alcohol Use: No   OB History   Grav Para Term Preterm Abortions TAB SAB Ect Mult Living                 Review of Systems  Constitutional: Negative for fever, chills, diaphoresis, appetite  change and fatigue.  HENT: Negative for mouth sores, sore throat and trouble swallowing.   Eyes: Negative for visual disturbance.  Respiratory: Negative for cough, chest tightness, shortness of breath and wheezing.   Cardiovascular: Negative for chest pain.  Gastrointestinal: Negative for nausea, vomiting, abdominal pain, diarrhea and abdominal distention.  Endocrine: Negative for polydipsia, polyphagia and polyuria.  Genitourinary: Negative for dysuria, frequency and hematuria.  Musculoskeletal: Positive for neck pain. Negative for gait problem.  Skin: Negative for color change, pallor and rash.  Neurological: Positive for headaches. Negative for dizziness, syncope and light-headedness.  Hematological: Does not bruise/bleed easily.  Psychiatric/Behavioral: Negative for behavioral problems and confusion.      Allergies  Cephalexin; Adhesive; and Latex  Home Medications   Prior to Admission medications   Medication Sig Start Date End Date Taking? Authorizing Provider  amLODipine (NORVASC) 10 MG tablet Take 10 mg by mouth daily.   Yes Historical Provider, MD  b complex vitamins tablet Take 1 tablet by mouth daily.   Yes Historical Provider, MD  buPROPion (WELLBUTRIN SR) 150 MG 12 hr tablet Take 1 tablet (150 mg total) by mouth daily. 08/30/13  Yes [name], MD  Cholecalciferol (VITAMIN D PO) Take 1 tablet by mouth daily.   Yes Historical Provider, MD  fluticasone (FLONASE) 50 MCG/ACT nasal spray Place 1 spray into both nostrils daily. 11/01/13  Yes Minette Headland, NP  gabapentin (NEURONTIN) 100 MG capsule Take 3 capsules (300 mg total) by mouth 3 (three) times daily. 09/13/13  Yes Minette Headland, NP  hydrALAZINE (APRESOLINE) 25 MG tablet Take 50 mg by mouth 3 (three) times daily.    Yes Historical Provider, MD  HYDROcodone-acetaminophen (NORCO/VICODIN) 5-325 MG per tablet Take 1-2 tablets by mouth every 4 (four) hours as needed for moderate pain. 12/13/13  Yes Minette Headland, NP  lidocaine-prilocaine (EMLA) cream Apply topically as needed. Apply to port-a-cath one - two hours before accessing. 08/30/13  Yes [name], MD  LORazepam (ATIVAN) 0.5 MG tablet Take 1 tablet (0.63m) every 6 hours as needed for nausea or vomiting. 08/30/13  Yes KDeatra Robinson MD  losartan (COZAAR) 100 MG tablet Take 100 mg by mouth daily.   Yes Historical Provider, MD  metoprolol succinate (TOPROL-XL) 25 MG 24 hr tablet Take 25 mg by mouth daily.    Yes Historical Provider, MD  pantoprazole (PROTONIX) 40 MG tablet Take 1 tablet (40 mg total) by mouth daily. 08/09/13  Yes ARobbie Lis MD  pioglitazone-metformin (ACTOPLUS MET) 14696074599MG per tablet Take 1 tablet by mouth every morning.    Yes Historical Provider, MD  potassium chloride SA (  K-DUR,KLOR-CON) 20 MEQ tablet Take 1 tablet (20 mEq total) by mouth 2 (two) times daily. 11/08/13  Yes Minette Headland, NP  rosuvastatin (CRESTOR) 5 MG tablet Take 5 mg by mouth daily.   Yes Historical Provider, MD  sitaGLIPtin (JANUVIA) 100 MG tablet Take 100 mg by mouth daily.   Yes Historical Provider, MD  furosemide (LASIX) 40 MG tablet Take 1 tablet (40 mg total) by mouth daily. 11/08/13   Minette Headland, NP  HYDROcodone-acetaminophen (NORCO/VICODIN) 5-325 MG per tablet Take 1 tablet by mouth every 4 (four) hours as needed. 02/14/14   Tanna Furry, MD  ondansetron (ZOFRAN) 4 MG tablet Take 1 tablet (4 mg total) by mouth every 6 (six) hours. 02/14/14   Tanna Furry, MD   BP 156/52  Pulse 78  Temp(Src) 98 F (36.7 C) (Oral)  Resp 20  Ht _0  (1.575 m)  Wt 195 lb (88.451 kg)  BMI 35.66 kg/m2  SpO2 95% Physical Exam  Constitutional: She is oriented to person, place, and time. She appears well-developed and well-nourished. No distress.  HENT:  Head: Normocephalic.  Eyes: Conjunctivae are normal. Pupils are equal, round, and reactive to light. No scleral icterus.  Neck: Normal range of motion. Neck supple. No thyromegaly present.   Cardiovascular: Normal rate and regular rhythm.  Exam reveals no gallop and no friction rub.   No murmur heard. Pulmonary/Chest: Effort normal and breath sounds normal. No respiratory distress. She has no wheezes. She has no rales.  Abdominal: Soft. Bowel sounds are normal. She exhibits no distension. There is no tenderness. There is no rebound.  Musculoskeletal: Normal range of motion.  Neurological: She is alert and oriented to person, place, and time.  Cranial nerves intact and symmetric. No pronator drift. Normal symmetric upper and lower extremity strength, and sensation.  No carotid bruits.   Skin: Skin is warm and dry. No rash noted.  Psychiatric: She has a normal mood and affect. Her behavior is normal.    ED Course  Procedures (including critical care time) Labs Review Labs Reviewed  CBC WITH DIFFERENTIAL - Abnormal; Notable for the following:    RBC 3.71 (*)    Hemoglobin 10.9 (*)    HCT 32.9 (*)    Monocytes Relative 14 (*)    All other components within normal limits  COMPREHENSIVE METABOLIC PANEL - Abnormal; Notable for the following:    Potassium 3.5 (*)    Albumin 3.4 (*)    All other components within normal limits    Imaging Review Ct Head Wo Contrast  02/13/2014   CLINICAL DATA:  Numbness and tingling in the left arm. Headache. Vertigo.  EXAM: CT HEAD WITHOUT CONTRAST  TECHNIQUE: Contiguous axial images were obtained from the base of the skull through the vertex without intravenous contrast.  COMPARISON:  Report of brain MRI dated 01/30/2002 and 11/20/2001  FINDINGS: There is no acute intracranial hemorrhage, infarction, or mass lesion. There is a tiny area of subcortical white matter lucency in the right posterior frontal region consistent with small vessel ischemic disease.  There is a 10 mm lytic lesion in the left posterior parietal bone which was described on the prior MRIs. 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 No other is abnormalities.   IMPRESSION: No acute intracranial abnormality. Small focal area of lucency high in the right posterior frontal region, probably small vessel ischemic disease.   Electronically Signed   By: Rozetta Nunnery M.D.   On: 02/13/2014 22:36   Mr Brain  Wo Contrast  02/14/2014   CLINICAL DATA:  HEADACHE LUE weakness, breast cancer HEADACHE LUE weakness, breast cancer  EXAM: MRI HEAD WITHOUT CONTRAST  TECHNIQUE: Multiplanar, multiecho pulse sequences of the brain and surrounding structures were obtained without intravenous contrast.  COMPARISON:  Prior CT from earlier the same day as well as MRI report from 01/30/2002.  FINDINGS: The CSF containing spaces are within normal limits for patient age. Patchy T2/FLAIR hyperintensity within the periventricular and supratentorial white matter noted, nonspecific, but likely related to moderate chronic microvascular ischemic disease. Demyelinating disease could also be considered.  No mass lesion, midline shift, or extra-axial fluid collection. Ventricles are normal in size without evidence of hydrocephalus.  No diffusion-weighted signal abnormality is identified to suggest acute intracranial infarct. Gray-white matter differentiation is maintained. Normal flow voids are seen within the intracranial vasculature. No intracranial hemorrhage identified. Probable remote tiny lacunar infarct noted within the right thalamus.  Mild cerebellar tonsillar ectopia of approximately 5 mm noted. Pituitary gland is within normal limits. Pituitary stalk is midline. The globes and optic nerves demonstrate a normal appearance with normal signal intensity.  1.3 cm T1 hyperintense well-circumscribed lesion within the left parietal calvarium noted, likely benign in nature. Bone marrow signal intensity is otherwise unremarkable. Visualized upper cervical spine is within normal limits.  Scalp soft tissues are unremarkable.  Paranasal sinuses are clear.  No mastoid effusion.  IMPRESSION: 1. No acute  intracranial infarct or other abnormality identified. 2. Patchy in T2/FLAIR hyperintensity within the periventricular and deep white matter of both cerebral hemispheres, nonspecific. Findings may be related to chronic microvascular ischemic disease. Demyelinating disease could also be considered in the correct clinical setting. 3. Subcentimeter remote lacunar infarct within the right thalamus. 4. 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   Electronically Signed   By: Jeannine Boga M.D.   On: 02/14/2014 01:16     EKG Interpretation None      MDM   Final diagnoses:  Acute nonintractable headache, unspecified headache type     CT shows perhaps a small area of ischemic change. No hemorrhage or metastasis. I do not think this represents CVA. However request an MRI of her brain with a history of bilateral breast cancer and should this is not a metastatic lesion. I think if this is normal she is appropriate for outpatient treatment. Given pain medication and Reglan her symptoms have improved.obvious    Tanna Furry, MD 02/15/14 [phone]

## 2014-02-13 NOTE — ED Notes (Signed)
Patient is texting on her cellphone

## 2014-02-13 NOTE — ED Notes (Signed)
MD at the bedside  

## 2014-02-14 ENCOUNTER — Telehealth: Payer: Self-pay | Admitting: *Deleted

## 2014-02-14 ENCOUNTER — Ambulatory Visit
Admission: RE | Admit: 2014-02-14 | Discharge: 2014-02-14 | Disposition: A | Discharge status: Home / Self Care | Payer: Medicare HMO | Source: Ambulatory Visit | Attending: Radiation Oncology | Admitting: Radiation Oncology

## 2014-02-14 DIAGNOSIS — Z510 Encounter for antineoplastic radiation therapy: Secondary | ICD-10-CM | POA: Diagnosis not present

## 2014-02-14 MED ORDER — HYDROCODONE-ACETAMINOPHEN 5-325 MG PO TABS: 1.0000 | ORAL_TABLET | ORAL | Status: DC | PRN

## 2014-02-14 MED ORDER — ONDANSETRON HCL 4 MG PO TABS: 4.0000 mg | ORAL_TABLET | Freq: Four times a day (QID) | ORAL | Status: DC

## 2014-02-14 NOTE — ED Notes (Signed)
Discharge and follow up instructions reviewed with pt. Pt verbalized understanding.  

## 2014-02-14 NOTE — Telephone Encounter (Signed)
Val Dodd,RN upstirs called asking about patient ,she received a call stating patient was admitted yesterday< patient was already d/c , 141am, and has already had rad tx this am informed Val this infomratin seen in epic and Donalda Ewings, patient had MRI brain, neg, Val said"ok, that's all I need to know",  9:50 AM

## 2014-02-14 NOTE — Discharge Instructions (Signed)
Migraine Headache A migraine headache is very bad, throbbing pain on one or both sides of your head. Talk to your doctor about what things may bring on (trigger) your migraine headaches. HOME CARE  Only take medicines as told by your doctor.  Lie down in a dark, quiet room when you have a migraine.  Keep a journal to find out if certain things bring on migraine headaches. For example, write down:  What you eat and drink.  How much sleep you get.  Any change to your diet or medicines.  Lessen how much alcohol you drink.  Quit smoking if you smoke.  Get enough sleep.  Lessen any stress in your life.  Keep lights dim if bright lights bother you or make your migraines worse. GET HELP RIGHT AWAY IF:   Your migraine becomes really bad.  You have a fever.  You have a stiff neck.  You have trouble seeing.  Your muscles are weak, or you lose muscle control.  You lose your balance or have trouble walking.  You feel like you will pass out (faint), or you pass out.  You have really bad symptoms that are different than your first symptoms. MAKE SURE YOU:   Understand these instructions.  Will watch your condition.  Will get help right away if you are not doing well or get worse. Document Released: 04/12/2008 Document Revised: 09/26/2011 Document Reviewed: 03/11/2013 [facility] Patient Information 2015 [place], [place]. This information is not intended to replace advice given to you by your health care provider. Make sure you discuss any questions you have with your health care provider.  General Headache Without Cause A headache is pain or discomfort felt around the head or neck area. The specific cause of a headache may not be found. There are many causes and types of headaches. A few common ones are:  Tension headaches.  Migraine headaches.  Cluster headaches.  Chronic daily headaches. HOME CARE INSTRUCTIONS   Keep all follow-up appointments with your caregiver or  any specialist referral.  Only take over-the-counter or prescription medicines for pain or discomfort as directed by your caregiver.  Lie down in a dark, quiet room when you have a headache.  Keep a headache journal to find out what may trigger your migraine headaches. For example, write down:  What you eat and drink.  How much sleep you get.  Any change to your diet or medicines.  Try massage or other relaxation techniques.  Put ice packs or heat on the head and neck. Use these 3 to 4 times per day for 15 to 20 minutes each time, or as needed.  Limit stress.  Sit up straight, and do not tense your muscles.  Quit smoking if you smoke.  Limit alcohol use.  Decrease the amount of caffeine you drink, or stop drinking caffeine.  Eat and sleep on a regular schedule.  Get 7 to 9 hours of sleep, or as recommended by your caregiver.  Keep lights dim if bright lights bother you and make your headaches worse. SEEK MEDICAL CARE IF:   You have problems with the medicines you were prescribed.  Your medicines are not working.  You have a change from the usual headache.  You have nausea or vomiting. SEEK IMMEDIATE MEDICAL CARE IF:   Your headache becomes severe.  You have a fever.  You have a stiff neck.  You have loss of vision.  You have muscular weakness or loss of muscle control.  You start losing  your balance or have trouble walking.  You feel faint or pass out.  You have severe symptoms that are different from your first symptoms. MAKE SURE YOU:   Understand these instructions.  Will watch your condition.  Will get help right away if you are not doing well or get worse. Document Released: 07/04/2005 Document Revised: 09/26/2011 Document Reviewed: 07/20/2011 [facility] Patient Information 2015 [place], [place]. This information is not intended to replace advice given to you by your health care provider. Make sure you discuss any questions you have with your  health care provider.

## 2014-02-16 NOTE — Assessment & Plan Note (Signed)
(  1) status post left breast upper outer quadrant and left axillary lymph node biopsy 03/07/2013, both positive for an invasive ductal carcinoma, grade 1 or 2, estrogen receptor and progesterone receptor both strongly positive, HER-2 negative, with an MIB-1 between 13% and 15%  (2) biopsy of a more anterior mass in the left breast 04/03/2013 showed invasive ductal carcinoma, grade 2, estrogen and progesterone receptor positive, HER-2 negative, with an MIB-1 of 33%  (3) status post left modified radical mastectomy 04/22/2013 for mpT3 pN2, stage IIIA invasive ductal carcinoma, grade 1, repeat HER-2 again negative; 4 of 22 lymph nodes positive; ample margins

## 2014-02-16 NOTE — Assessment & Plan Note (Signed)
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

## 2014-02-17 ENCOUNTER — Ambulatory Visit
Admission: RE | Admit: 2014-02-17 | Discharge: 2014-02-17 | Disposition: A | Discharge status: Home / Self Care | Payer: Medicare HMO | Source: Ambulatory Visit | Attending: Radiation Oncology | Admitting: Radiation Oncology

## 2014-02-17 DIAGNOSIS — Z510 Encounter for antineoplastic radiation therapy: Secondary | ICD-10-CM | POA: Diagnosis not present

## 2014-02-18 ENCOUNTER — Ambulatory Visit
Admission: RE | Admit: 2014-02-18 | Discharge: 2014-02-18 | Disposition: A | Discharge status: Home / Self Care | Payer: Medicare HMO | Source: Ambulatory Visit | Attending: Radiation Oncology | Admitting: Radiation Oncology

## 2014-02-18 VITALS — BP 161/57 | HR 90 | Temp 98.0°F | Wt 198.3 lb

## 2014-02-18 DIAGNOSIS — Z510 Encounter for antineoplastic radiation therapy: Secondary | ICD-10-CM | POA: Diagnosis not present

## 2014-02-18 DIAGNOSIS — C50412 Malignant neoplasm of upper-outer quadrant of left female breast: Secondary | ICD-10-CM

## 2014-02-18 NOTE — Progress Notes (Signed)
Weekly assessment of left chestwall radiation.markedhyperpigmentation without peeling.Recently had overnight stay with newly diagnosed migraines.brain mri and ct of head negative.to pick up medication for migraines today and bring tomorrwo to be added to med list.Generalized fatigue. no worse than the start of radiation.

## 2014-02-18 NOTE — Progress Notes (Signed)
Weekly Management Note Current Dose: 23.4  Gy  Projected Dose: 60.4 Gy   Narrative:  The patient presents for routine under treatment assessment.  CBCT/MVCT images/Port film x-rays were reviewed.  The chart was checked. Doing well. Minimal skin irrigationn. DX with migraines during recent hospitalization.   Physical Findings: Weight: 198 lb 4.8 oz (89.948 kg). Minimal hyperpigmentation. Lines on chest for medial borders.   Impression:  The patient is tolerating radiation.  Plan:  Continue treatment as planned. Continue radiaplex.

## 2014-02-19 ENCOUNTER — Other Ambulatory Visit: Payer: Self-pay | Admitting: *Deleted

## 2014-02-19 ENCOUNTER — Other Ambulatory Visit: Payer: Self-pay | Admitting: Oncology

## 2014-02-19 ENCOUNTER — Ambulatory Visit
Admission: RE | Admit: 2014-02-19 | Discharge: 2014-02-19 | Disposition: A | Discharge status: Home / Self Care | Payer: Medicare HMO | Source: Ambulatory Visit | Attending: Radiation Oncology | Admitting: Radiation Oncology

## 2014-02-19 DIAGNOSIS — Z510 Encounter for antineoplastic radiation therapy: Secondary | ICD-10-CM | POA: Diagnosis not present

## 2014-02-19 MED ORDER — LOSARTAN POTASSIUM 100 MG PO TABS: 100.0000 mg | ORAL_TABLET | Freq: Every day | ORAL | Status: DC

## 2014-02-19 NOTE — Telephone Encounter (Signed)
Fax from [place] that insurance covers/prefers 90 day supply of medications. Drug identified as losartan 100 mg. Approved for #90 day supply with no refills. All future refills to come from Dr. Glendale Chard with [facility]. Called office and confirmed patient is still being follow there.

## 2014-02-20 ENCOUNTER — Ambulatory Visit
Admission: RE | Admit: 2014-02-20 | Discharge: 2014-02-20 | Disposition: A | Discharge status: Home / Self Care | Payer: Medicare HMO | Source: Ambulatory Visit | Attending: Radiation Oncology | Admitting: Radiation Oncology

## 2014-02-20 DIAGNOSIS — Z510 Encounter for antineoplastic radiation therapy: Secondary | ICD-10-CM | POA: Diagnosis not present

## 2014-02-21 ENCOUNTER — Ambulatory Visit
Admission: RE | Admit: 2014-02-21 | Discharge: 2014-02-21 | Disposition: A | Discharge status: Home / Self Care | Payer: Medicare HMO | Source: Ambulatory Visit | Attending: Radiation Oncology | Admitting: Radiation Oncology

## 2014-02-21 DIAGNOSIS — Z510 Encounter for antineoplastic radiation therapy: Secondary | ICD-10-CM | POA: Diagnosis not present

## 2014-02-24 ENCOUNTER — Ambulatory Visit
Admission: RE | Admit: 2014-02-24 | Discharge: 2014-02-24 | Disposition: A | Discharge status: Home / Self Care | Payer: Medicare HMO | Source: Ambulatory Visit | Attending: Radiation Oncology | Admitting: Radiation Oncology

## 2014-02-24 DIAGNOSIS — Z510 Encounter for antineoplastic radiation therapy: Secondary | ICD-10-CM | POA: Diagnosis not present

## 2014-02-25 ENCOUNTER — Ambulatory Visit
Admission: RE | Admit: 2014-02-25 | Discharge: 2014-02-25 | Disposition: A | Discharge status: Home / Self Care | Payer: Medicare HMO | Source: Ambulatory Visit | Attending: Radiation Oncology | Admitting: Radiation Oncology

## 2014-02-25 VITALS — BP 187/90 | HR 86 | Temp 98.4°F | Wt 201.1 lb

## 2014-02-25 DIAGNOSIS — C50412 Malignant neoplasm of upper-outer quadrant of left female breast: Secondary | ICD-10-CM

## 2014-02-25 DIAGNOSIS — Z510 Encounter for antineoplastic radiation therapy: Secondary | ICD-10-CM | POA: Diagnosis not present

## 2014-02-25 NOTE — Progress Notes (Signed)
Weekly assessment of radiation to left chest wall.Mild discoloration.No peeling.Blood pressure elevated scheduled to see primary care physician on tomorrow.generalized fatigue.Headache last night resolved without medication.

## 2014-02-25 NOTE — Progress Notes (Signed)
Weekly Management Note Current Dose: 32.4  Gy  Projected Dose: 60.4 Gy   Narrative:  The patient presents for routine under treatment assessment.  CBCT/MVCT images/Port film x-rays were reviewed.  The chart was checked. Doing well. Mild tanning. Headaches less intense.   Physical Findings: Weight: 201 lb 1.6 oz (91.218 kg). Slight darkening. Worse on inframammary fold of right breast.   Impression:  The patient is tolerating radiation.  Plan:  Continue treatment as planned. Continue radiaplex.

## 2014-02-26 ENCOUNTER — Ambulatory Visit
Admission: RE | Admit: 2014-02-26 | Discharge: 2014-02-26 | Disposition: A | Discharge status: Home / Self Care | Payer: Medicare HMO | Source: Ambulatory Visit | Attending: Radiation Oncology | Admitting: Radiation Oncology

## 2014-02-26 DIAGNOSIS — Z510 Encounter for antineoplastic radiation therapy: Secondary | ICD-10-CM | POA: Diagnosis not present

## 2014-02-27 ENCOUNTER — Encounter: Payer: Self-pay | Admitting: *Deleted

## 2014-02-27 ENCOUNTER — Ambulatory Visit
Admission: RE | Admit: 2014-02-27 | Discharge: 2014-02-27 | Disposition: A | Discharge status: Home / Self Care | Payer: Medicare HMO | Source: Ambulatory Visit | Attending: Radiation Oncology | Admitting: Radiation Oncology

## 2014-02-27 DIAGNOSIS — Z510 Encounter for antineoplastic radiation therapy: Secondary | ICD-10-CM | POA: Diagnosis not present

## 2014-02-27 NOTE — Progress Notes (Signed)
[place] Work  Clinical Social Work was referred by P[employer] scheme manager for assessment of psychosocial needs due to possible need for additional support.  Clinical Social Worker contacted patient at home to offer support and assess for needs.  Pt reports overall she feels like things are going pretty well currently. She has been able to get her transportation needs met and this is working out well. CSW inquired about coping and pt's bipolar disorder. Pt reports to have some good days and not so good days. She does feel her medications are good currently. Pt open to additional counseling and support from the [facility] Support Team. Pt agreed to meet with CSW on 03/13/14 to reassess needs and discuss coping further.   Clinical Social Work interventions: Solution focused discussion Follow up counseling appt   Loren Racer, LCSW Clinical Social Worker [facility] [place] [facility] for [place] Wednesday, Thursday and Friday Phone: [phone] Fax: [phone]

## 2014-02-28 ENCOUNTER — Ambulatory Visit
Admission: RE | Admit: 2014-02-28 | Discharge: 2014-02-28 | Disposition: A | Discharge status: Home / Self Care | Payer: Medicare HMO | Source: Ambulatory Visit | Attending: Radiation Oncology | Admitting: Radiation Oncology

## 2014-02-28 DIAGNOSIS — Z510 Encounter for antineoplastic radiation therapy: Secondary | ICD-10-CM | POA: Diagnosis not present

## 2014-03-03 ENCOUNTER — Ambulatory Visit
Admission: RE | Admit: 2014-03-03 | Discharge: 2014-03-03 | Disposition: A | Discharge status: Home / Self Care | Payer: Medicare HMO | Source: Ambulatory Visit | Attending: Radiation Oncology | Admitting: Radiation Oncology

## 2014-03-03 DIAGNOSIS — Z510 Encounter for antineoplastic radiation therapy: Secondary | ICD-10-CM | POA: Diagnosis not present

## 2014-03-04 ENCOUNTER — Ambulatory Visit
Admission: RE | Admit: 2014-03-04 | Discharge: 2014-03-04 | Disposition: A | Discharge status: Home / Self Care | Payer: Medicare HMO | Source: Ambulatory Visit | Attending: Radiation Oncology | Admitting: Radiation Oncology

## 2014-03-04 VITALS — BP 147/85 | HR 80 | Temp 97.9°F | Wt 195.6 lb

## 2014-03-04 DIAGNOSIS — Z510 Encounter for antineoplastic radiation therapy: Secondary | ICD-10-CM | POA: Diagnosis not present

## 2014-03-04 DIAGNOSIS — C50412 Malignant neoplasm of upper-outer quadrant of left female breast: Secondary | ICD-10-CM

## 2014-03-04 NOTE — Progress Notes (Signed)
Weekly assessment of radiation to bilateral chest.Marked hyperpigmentation bilaterally with peeling under right breast.To apply neosporin to this area and continue radiaplex in remaining treatment field.Continued fatigue unchanged since completion of chemotherapy.

## 2014-03-04 NOTE — Progress Notes (Signed)
Weekly Management Note Current Dose: 41.4  Gy  Projected Dose: 60.4 Gy   Narrative:  The patient presents for routine under treatment assessment.  CBCT/MVCT images/Port film x-rays were reviewed.  The chart was checked. Seen on tx machine today to markout boost and verify boost field on right breast.   Physical Findings: Weight: 195 lb 9.6 oz (88.724 kg). Pink/dark/irritated skin. Some moist desquamation in the inframammary fold on the left.   Impression:  The patient is tolerating radiation.  Plan:  Continue treatment as planned.Gentian violet to inframammary fold. Continue biafene to rest of skin. Boost ok.

## 2014-03-05 ENCOUNTER — Ambulatory Visit
Admission: RE | Admit: 2014-03-05 | Discharge: 2014-03-05 | Disposition: A | Discharge status: Home / Self Care | Payer: Medicare HMO | Source: Ambulatory Visit | Attending: Radiation Oncology | Admitting: Radiation Oncology

## 2014-03-05 DIAGNOSIS — Z510 Encounter for antineoplastic radiation therapy: Secondary | ICD-10-CM | POA: Diagnosis not present

## 2014-03-06 ENCOUNTER — Ambulatory Visit: Payer: Medicare HMO

## 2014-03-06 DIAGNOSIS — Z510 Encounter for antineoplastic radiation therapy: Secondary | ICD-10-CM | POA: Diagnosis not present

## 2014-03-07 ENCOUNTER — Ambulatory Visit
Admission: RE | Admit: 2014-03-07 | Discharge: 2014-03-07 | Disposition: A | Discharge status: Home / Self Care | Payer: Medicare HMO | Source: Ambulatory Visit | Attending: Radiation Oncology | Admitting: Radiation Oncology

## 2014-03-07 DIAGNOSIS — Z510 Encounter for antineoplastic radiation therapy: Secondary | ICD-10-CM | POA: Diagnosis not present

## 2014-03-10 ENCOUNTER — Ambulatory Visit
Admission: RE | Admit: 2014-03-10 | Discharge: 2014-03-10 | Disposition: A | Discharge status: Home / Self Care | Payer: Medicare HMO | Source: Ambulatory Visit | Attending: Radiation Oncology | Admitting: Radiation Oncology

## 2014-03-10 ENCOUNTER — Telehealth: Payer: Self-pay | Admitting: *Deleted

## 2014-03-10 DIAGNOSIS — Z510 Encounter for antineoplastic radiation therapy: Secondary | ICD-10-CM | POA: Diagnosis not present

## 2014-03-10 NOTE — Telephone Encounter (Signed)
Called pt to schedule intake counseling appt; scheduled for 03/14/2014 at 10 am.

## 2014-03-11 ENCOUNTER — Ambulatory Visit
Admission: RE | Admit: 2014-03-11 | Discharge: 2014-03-11 | Disposition: A | Discharge status: Home / Self Care | Payer: Medicare HMO | Source: Ambulatory Visit | Attending: Radiation Oncology | Admitting: Radiation Oncology

## 2014-03-11 ENCOUNTER — Encounter: Payer: Self-pay | Admitting: Radiation Oncology

## 2014-03-11 VITALS — BP 116/74 | HR 76 | Temp 97.9°F | Resp 20 | Wt 190.8 lb

## 2014-03-11 DIAGNOSIS — Z510 Encounter for antineoplastic radiation therapy: Secondary | ICD-10-CM | POA: Diagnosis not present

## 2014-03-11 DIAGNOSIS — C50412 Malignant neoplasm of upper-outer quadrant of left female breast: Secondary | ICD-10-CM

## 2014-03-11 MED ORDER — HYDROCODONE-ACETAMINOPHEN 5-325 MG PO TABS: 1.0000 | ORAL_TABLET | Freq: Every evening | ORAL | Status: DC | PRN

## 2014-03-11 NOTE — Progress Notes (Signed)
Weekly rad txs b/l breast,cw, 28/33 tanning on left cw, dry  Skin peeled under right inframmary fold area, patient stated a lot better there", using radiaplex bid, need refill on her hydrocodne 5/325mg , for paibn, pain in breat area left side most ly at night keeps her awake, sees Dr. Jana Hakim 04/15/14, was a patient of Dr. Humphrey Rolls 9:21 AM

## 2014-03-11 NOTE — Progress Notes (Signed)
Weekly Management Note Current Dose:51 (right), 50.4 (left)   Gy  Projected Dose: 61, 60.4 Gy   Narrative:  The patient presents for routine under treatment assessment.  CBCT/MVCT images/Port film x-rays were reviewed.  The chart was checked. Doing well. Sore right breast especially at night. Takes 1 norco to help with pain and to help her sleep. Has  appts scheduled upstairs and with surgery.   Physical Findings: Weight: 190 lb 12.8 oz (86.546 kg). Moist desquamation significantly better under right breast. No moist desquamation over left chest wall.   Impression:  The patient is tolerating radiation.  Plan:  Continue treatment as planned. Continue radiaplex.

## 2014-03-12 ENCOUNTER — Ambulatory Visit
Admission: RE | Admit: 2014-03-12 | Discharge: 2014-03-12 | Disposition: A | Discharge status: Home / Self Care | Payer: Medicare HMO | Source: Ambulatory Visit | Attending: Radiation Oncology | Admitting: Radiation Oncology

## 2014-03-12 DIAGNOSIS — Z510 Encounter for antineoplastic radiation therapy: Secondary | ICD-10-CM | POA: Diagnosis not present

## 2014-03-13 ENCOUNTER — Encounter: Payer: Self-pay | Admitting: *Deleted

## 2014-03-13 ENCOUNTER — Ambulatory Visit: Payer: Medicare HMO | Admitting: Radiation Oncology

## 2014-03-13 NOTE — Progress Notes (Signed)
[place] Work  Clinical Social Work phoned pt to clarify appointments. CSW was unaware of appointment with counseling intern for tomorrow. Pt aware and agrees to that appointment. She plans to be here for counseling appt in the am.    Loren Racer, [place] Worker Josephina Shih. [facility] [place] Wednesday, Thursday and Friday Phone: [phone] Fax: [phone]

## 2014-03-14 ENCOUNTER — Ambulatory Visit
Admission: RE | Admit: 2014-03-14 | Discharge: 2014-03-14 | Disposition: A | Discharge status: Home / Self Care | Payer: Medicare HMO | Source: Ambulatory Visit | Attending: Radiation Oncology | Admitting: Radiation Oncology

## 2014-03-14 ENCOUNTER — Encounter: Payer: Self-pay | Admitting: *Deleted

## 2014-03-14 DIAGNOSIS — Z510 Encounter for antineoplastic radiation therapy: Secondary | ICD-10-CM | POA: Diagnosis not present

## 2014-03-14 NOTE — Progress Notes (Signed)
Counselor met with patient and conducted intake session. She expressed confidence in being cured when treatment is completed.   Counselor will meet with patient after next drs appt on 9/01 at 37 am.  [place] Barclay Lennox Counseling Intern [phone]

## 2014-03-17 ENCOUNTER — Ambulatory Visit
Admission: RE | Admit: 2014-03-17 | Discharge: 2014-03-17 | Disposition: A | Discharge status: Home / Self Care | Payer: Medicare HMO | Source: Ambulatory Visit | Attending: Radiation Oncology | Admitting: Radiation Oncology

## 2014-03-17 ENCOUNTER — Ambulatory Visit: Payer: Medicare HMO

## 2014-03-17 DIAGNOSIS — Z510 Encounter for antineoplastic radiation therapy: Secondary | ICD-10-CM | POA: Diagnosis not present

## 2014-03-18 ENCOUNTER — Ambulatory Visit
Admission: RE | Admit: 2014-03-18 | Discharge: 2014-03-18 | Disposition: A | Discharge status: Home / Self Care | Payer: Medicare HMO | Source: Ambulatory Visit | Attending: Radiation Oncology | Admitting: Radiation Oncology

## 2014-03-18 ENCOUNTER — Ambulatory Visit: Payer: Medicare HMO

## 2014-03-18 ENCOUNTER — Encounter: Payer: Self-pay | Admitting: *Deleted

## 2014-03-18 VITALS — BP 161/81 | HR 77 | Temp 97.7°F | Wt 181.4 lb

## 2014-03-18 DIAGNOSIS — C50412 Malignant neoplasm of upper-outer quadrant of left female breast: Secondary | ICD-10-CM

## 2014-03-18 DIAGNOSIS — Z510 Encounter for antineoplastic radiation therapy: Secondary | ICD-10-CM | POA: Diagnosis not present

## 2014-03-18 MED ORDER — BIAFINE EX EMUL
Freq: Two times a day (BID) | CUTANEOUS | Status: DC
  Administered 2014-03-18: 17:00:00 via TOPICAL

## 2014-03-18 NOTE — Progress Notes (Signed)
Weekly assessment of radiation to bilateral chest.Left chest wall with increased pain .Hydrogel pain not helpful so area painted with gentian violet one day ago and looks and feels much better today.Continued fatigue but in very good mood as completes treatment  tomorrow.Will give biafine today to apply at least twice daily.

## 2014-03-18 NOTE — Progress Notes (Signed)
Pt was unable to keep appt at 11 am due to transportation conflict. Counselor rescheduled for 09/08 at 10 AM.  [place] Nakul Avino Counseling Intern [phone]

## 2014-03-18 NOTE — Progress Notes (Signed)
Weekly Management Note Current Dose:  59 (right), 58.4 (left) Gy  Projected Dose: 61, 60.4 Gy   Narrative:  The patient presents for routine under treatment assessment.  CBCT/MVCT images/Port film x-rays were reviewed.  The chart was checked. Gentian violet helped. More pain on left chest wall. Using pain pills. Radiaplex and hydrogel not working.   Physical Findings: Weight: 181 lb 6.4 oz (82.283 kg). Dry desquamation over left chest wall. No moist desquamation.   Impression:  The patient is tolerating radiation.  Plan:  Continue treatment as planned. Switch to biafene follow up in 2 weeks. Sooner prn. Discussed skin care. Has appt with Magrinat end of the month.

## 2014-03-19 ENCOUNTER — Encounter: Payer: Self-pay | Admitting: Radiation Oncology

## 2014-03-19 ENCOUNTER — Ambulatory Visit
Admission: RE | Admit: 2014-03-19 | Discharge: 2014-03-19 | Disposition: A | Discharge status: Home / Self Care | Payer: Medicare HMO | Source: Ambulatory Visit | Attending: Radiation Oncology | Admitting: Radiation Oncology

## 2014-03-19 DIAGNOSIS — Z510 Encounter for antineoplastic radiation therapy: Secondary | ICD-10-CM | POA: Diagnosis not present

## 2014-03-24 NOTE — Progress Notes (Signed)
  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 with tumor size 5.5 cm and 4/22 positive lymph ndoes)     Indication for treatment:  Curative       Radiation treatment dates:   01/30/2014-03/19/2014  Site/dose:     Right breast / 45 Gray @ 1.8 [place] Cables per fraction x 25 fractions Right breast boost / 16 Gray at [employer] per fraction x 8 fractions  Left chest wall / 50.4 Gray @ 1.8 [place] Cables per fraction x 28 fractions Left Supraclavicular fossa / 45 Gray @1 .8 [place] Cables per fraction x 25 fractions Left scar / 10 Gray at [employer] per fraction x 5 fractions  Beams/energy:  Opposed Tangents with electronic compensation and every other day bolus / 6 and 10MV photons En face / 15 MeV electrons  Opposed Tangents / 6 MV photons Right anterior oblique / 6 MV photons En face with 0.5 cm bolus daily to the 95% IDL/ 9 MeV electrons  Narrative: The patient tolerated radiation treatment relatively well.   She had moist desquamation particularly in the axilla and skin folds of the  Right breast. This was treated with genetian violet.  She had dry desquamation over the left chest wall.   Plan: The patient has completed radiation treatment. The patient will return to radiation oncology clinic for routine followup in one month. I advised them to call or return sooner if they have any questions or concerns related to their recovery or treatment.  ------------------------------------------------  Thea Silversmith, MD

## 2014-03-28 NOTE — Progress Notes (Signed)
Name: Kathy Maxwell   MRN: [id number]  Date:  03/04/14   DOB: 1956-02-07  Status:outpatient    DIAGNOSIS: No diagnosis found.  CONSENT VERIFIED: yes   SET UP: Patient is setup supine   IMMOBILIZATION:  The following immobilization was used:Custom Moldable Pillow, breast board.   NARRATIVE: [name] underwent complex simulation and treatment planning for her boost treatment today. The depth of her scar to chest wall was measured on the treatment planning CT. Based on this measurement, 9 MeV electrons will be prescribed to the 95% IDL with a 0.5 cm bolus used daily.   I personally oversaw and approved the construction of a unique block which will be used for beam modification purposes.  A special port plan is requested.

## 2014-03-28 NOTE — Progress Notes (Signed)
Name: Kathy Maxwell   MRN: [id number]  Date:  02/27/14   DOB: 05-18-1956  Status:outpatient    DIAGNOSIS: Bilateral breast cancer  CONSENT VERIFIED: yes   SET UP: Patient is setup supine   IMMOBILIZATION:  The following immobilization was used:Custom Moldable Pillow, breast board.   NARRATIVE: [name] underwent complex simulation and treatment planning for her boost treatment today.  Her tumor volume was outlined on the planning CT scan. The depth of her cavity was felt to be appropriate for treatment with electrons    15 MeV electrons will be prescribed to the 100% isodose line.   I personally oversaw and approved the construction of a unique block which will be used for beam modification purposes.  A special port plan is requested.

## 2014-03-31 ENCOUNTER — Ambulatory Visit: Payer: Medicare HMO | Admitting: Radiation Oncology

## 2014-04-03 ENCOUNTER — Ambulatory Visit
Admission: RE | Admit: 2014-04-03 | Discharge: 2014-04-03 | Disposition: A | Discharge status: Home / Self Care | Payer: Medicare HMO | Source: Ambulatory Visit | Attending: Oncology | Admitting: Oncology

## 2014-04-03 ENCOUNTER — Ambulatory Visit
Admission: RE | Admit: 2014-04-03 | Discharge: 2014-04-03 | Disposition: A | Discharge status: Home / Self Care | Payer: Medicare HMO | Source: Ambulatory Visit | Attending: Radiation Oncology | Admitting: Radiation Oncology

## 2014-04-03 VITALS — BP 179/82 | HR 87 | Temp 98.0°F | Wt 192.7 lb

## 2014-04-03 DIAGNOSIS — D0591 Unspecified type of carcinoma in situ of right breast: Secondary | ICD-10-CM

## 2014-04-03 DIAGNOSIS — C50919 Malignant neoplasm of unspecified site of unspecified female breast: Secondary | ICD-10-CM | POA: Diagnosis not present

## 2014-04-03 DIAGNOSIS — M8580 Other specified disorders of bone density and structure, unspecified site: Secondary | ICD-10-CM

## 2014-04-03 DIAGNOSIS — Z9010 Acquired absence of unspecified breast and nipple: Secondary | ICD-10-CM | POA: Diagnosis not present

## 2014-04-03 DIAGNOSIS — Z510 Encounter for antineoplastic radiation therapy: Secondary | ICD-10-CM | POA: Insufficient documentation

## 2014-04-03 DIAGNOSIS — C50412 Malignant neoplasm of upper-outer quadrant of left female breast: Secondary | ICD-10-CM

## 2014-04-03 MED ORDER — BIAFINE EX EMUL
Freq: Two times a day (BID) | CUTANEOUS | Status: DC
  Administered 2014-04-03: 10:00:00 via TOPICAL

## 2014-04-03 NOTE — Progress Notes (Signed)
Department of Radiation Oncology  Phone:  [phone] Fax:        [phone]   Name: Kathy Maxwell MRN: [id number]  DOB: October 20, 1955  Date: 04/03/2014  Follow Up Visit Note  Diagnosis: Bilateral breast cancer (s/p mastectomy on the left, lumpectomy on the right)  Summary and Interval since last radiation: 2 weeks  Interval History: Kathy Maxwell presents today for routine followup.  Her skin has healed up. There is no longer any areas of moist desquamation on the right. She is using baifene. She has pain over the left chest wall. She has some itching of the skin. She has an appointment with medical oncology next week.   Allergies:  Allergies  Allergen Reactions  . Cephalexin Other (See Comments)    Burned throat.  . Adhesive [Tape] Rash    Clear tape gloves ,elastic no problem  . Latex Hives and Rash    Medications:  Current Outpatient Prescriptions  Medication Sig Dispense Refill  . amLODipine (NORVASC) 10 MG tablet Take 10 mg by mouth daily.      [name] b complex vitamins tablet Take 1 tablet by mouth daily.      [name] buPROPion (WELLBUTRIN SR) 150 MG 12 hr tablet Take 1 tablet (150 mg total) by mouth daily.  30 tablet  6  . Cholecalciferol (VITAMIN D PO) Take 1 tablet by mouth daily.      . fluticasone (FLONASE) 50 MCG/ACT nasal spray Place 1 spray into both nostrils daily.  9.9 g  0  . furosemide (LASIX) 40 MG tablet Take 1 tablet (40 mg total) by mouth daily.  30 tablet  0  . gabapentin (NEURONTIN) 100 MG capsule Take 3 capsules (300 mg total) by mouth 3 (three) times daily.  870 capsule  6  . hydrALAZINE (APRESOLINE) 25 MG tablet Take 50 mg by mouth 3 (three) times daily.       [name] HYDROcodone-acetaminophen (NORCO/VICODIN) 5-325 MG per tablet Take 1 tablet by mouth at bedtime as needed for moderate pain.  30 tablet  0  . lidocaine-prilocaine (EMLA) cream Apply topically as needed. Apply to port-a-cath one - two hours before accessing.  30 g  3  . LORazepam (ATIVAN) 0.5 MG tablet Take  1 tablet (0.41m) every 6 hours as needed for nausea or vomiting.  30 tablet  2  . losartan (COZAAR) 100 MG tablet Take 1 tablet (100 mg total) by mouth daily.  90 tablet  0  . metoprolol succinate (TOPROL-XL) 25 MG 24 hr tablet Take 25 mg by mouth daily.       . ondansetron (ZOFRAN) 4 MG tablet Take 1 tablet (4 mg total) by mouth every 6 (six) hours.  12 tablet  0  . pantoprazole (PROTONIX) 40 MG tablet Take 1 tablet (40 mg total) by mouth daily.  30 tablet  3  . pioglitazone-metformin (ACTOPLUS MET) 15-850 MG per tablet Take 1 tablet by mouth every morning.       . potassium chloride SA (K-DUR,KLOR-CON) 20 MEQ tablet Take 1 tablet (20 mEq total) by mouth 2 (two) times daily.  60 tablet  0  . rosuvastatin (CRESTOR) 5 MG tablet Take 5 mg by mouth daily.      . sitaGLIPtin (JANUVIA) 100 MG tablet Take 100 mg by mouth daily.      . hyaluronate sodium (RADIAPLEXRX) GEL Apply 1 application topically 2 (two) times daily.       Current Facility-Administered Medications  Medication Dose Route Frequency Provider Last Rate Last  Dose  . topical emolient (BIAFINE) emulsion   Topical BID Thea Silversmith, MD       Facility-Administered Medications Ordered in Other Encounters  Medication Dose Route Frequency Provider Last Rate Last Dose  . sodium chloride 0.9 % injection 10 mL  10 mL Intravenous PRN [name], MD   10 mL at 05/24/13 1522    Physical Exam:  Filed Vitals:   04/03/14 0935  BP: 179/82  Pulse: 87  Temp: 98 F (36.7 C)  Weight: 192 lb 11.2 oz (87.408 kg)  SpO2: 96%   *0**  IMPRESSION: Kathy Maxwell is a 58 y.o. female Dry skin over bilateral chest wall/breast. Hyper pigmented as well with hyperpigmentation over her back. Area of moist desquamation laterall on the chest wall is healing.   PLAN:  Continue biafene. I will see her back on the 29th to check her skin again.     Thea Silversmith, MD

## 2014-04-03 NOTE — Progress Notes (Signed)
Patient for routine one month follow up of radiation to bilateral chest wall for breast cancer.tthe right side is healed with continued hyperpigmentation.The left side is dry, parched and hyperpigmented.Continued itching and tenderness of left side.Given another tube of biafine to continue to apply over the next 2 weeks before changing to lotion with vitamin e.

## 2014-04-08 ENCOUNTER — Other Ambulatory Visit (HOSPITAL_BASED_OUTPATIENT_CLINIC_OR_DEPARTMENT_OTHER): Payer: Medicare HMO

## 2014-04-08 ENCOUNTER — Encounter: Payer: Self-pay | Admitting: *Deleted

## 2014-04-08 DIAGNOSIS — C50412 Malignant neoplasm of upper-outer quadrant of left female breast: Secondary | ICD-10-CM

## 2014-04-08 DIAGNOSIS — C50419 Malignant neoplasm of upper-outer quadrant of unspecified female breast: Secondary | ICD-10-CM

## 2014-04-08 LAB — COMPREHENSIVE METABOLIC PANEL (CC13)
ALBUMIN: 3.7 g/dL (ref 3.5–5.0)
ALT: 8 U/L (ref 0–55)
ANION GAP: 11 meq/L (ref 3–11)
AST: 13 U/L (ref 5–34)
Alkaline Phosphatase: 108 U/L (ref 40–150)
BUN: 13.9 mg/dL (ref 7.0–26.0)
CALCIUM: 9.6 mg/dL (ref 8.4–10.4)
CHLORIDE: 105 meq/L (ref 98–109)
CO2: 25 meq/L (ref 22–29)
Creatinine: 0.9 mg/dL (ref 0.6–1.1)
GLUCOSE: 251 mg/dL — AB (ref 70–140)
Potassium: 3.3 mEq/L — ABNORMAL LOW (ref 3.5–5.1)
SODIUM: 141 meq/L (ref 136–145)
TOTAL PROTEIN: 7 g/dL (ref 6.4–8.3)
Total Bilirubin: 0.45 mg/dL (ref 0.20–1.20)

## 2014-04-08 LAB — CBC WITH DIFFERENTIAL/PLATELET
BASO%: 0.5 % (ref 0.0–2.0)
Basophils Absolute: 0 10*3/uL (ref 0.0–0.1)
EOS%: 2.5 % (ref 0.0–7.0)
Eosinophils Absolute: 0.1 10*3/uL (ref 0.0–0.5)
HCT: 34.9 % (ref 34.8–46.6)
HGB: 11.6 g/dL (ref 11.6–15.9)
LYMPH#: 0.9 10*3/uL (ref 0.9–3.3)
LYMPH%: 19.8 % (ref 14.0–49.7)
MCH: 29.7 pg (ref 25.1–34.0)
MCHC: 33.2 g/dL (ref 31.5–36.0)
MCV: 89.3 fL (ref 79.5–101.0)
MONO#: 0.4 10*3/uL (ref 0.1–0.9)
MONO%: 10 % (ref 0.0–14.0)
NEUT#: 3 10*3/uL (ref 1.5–6.5)
NEUT%: 67.2 % (ref 38.4–76.8)
Platelets: 250 10*3/uL (ref 145–400)
RBC: 3.91 10*6/uL (ref 3.70–5.45)
RDW: 13.2 % (ref 11.2–14.5)
WBC: 4.4 10*3/uL (ref 3.9–10.3)

## 2014-04-08 NOTE — Progress Notes (Signed)
Counselor met with pt today after several weeks. Pt has completed treatment and stated she is now cancer free. Pt is very excited to move forward with her life after treatment and will use counseling to explore what the recovery journey could look like for her. Appt scheduled for next week.  [place] Ryo Klang Counseling Intern [phone]

## 2014-04-15 ENCOUNTER — Ambulatory Visit
Admission: RE | Admit: 2014-04-15 | Discharge: 2014-04-15 | Disposition: A | Discharge status: Home / Self Care | Payer: Medicare HMO | Source: Ambulatory Visit | Attending: Radiation Oncology | Admitting: Radiation Oncology

## 2014-04-15 ENCOUNTER — Ambulatory Visit (HOSPITAL_BASED_OUTPATIENT_CLINIC_OR_DEPARTMENT_OTHER): Payer: Medicare HMO | Admitting: Oncology

## 2014-04-15 ENCOUNTER — Encounter: Payer: Self-pay | Admitting: *Deleted

## 2014-04-15 VITALS — BP 193/84 | HR 84 | Resp 18

## 2014-04-15 VITALS — BP 195/80 | HR 81 | Temp 98.4°F | Resp 18 | Ht 62.0 in | Wt 191.6 lb

## 2014-04-15 DIAGNOSIS — C773 Secondary and unspecified malignant neoplasm of axilla and upper limb lymph nodes: Secondary | ICD-10-CM

## 2014-04-15 DIAGNOSIS — I10 Essential (primary) hypertension: Secondary | ICD-10-CM

## 2014-04-15 DIAGNOSIS — C50412 Malignant neoplasm of upper-outer quadrant of left female breast: Secondary | ICD-10-CM

## 2014-04-15 DIAGNOSIS — Z170 Estrogen receptor positive status [ER+]: Secondary | ICD-10-CM

## 2014-04-15 DIAGNOSIS — D0591 Unspecified type of carcinoma in situ of right breast: Secondary | ICD-10-CM

## 2014-04-15 DIAGNOSIS — F319 Bipolar disorder, unspecified: Secondary | ICD-10-CM

## 2014-04-15 DIAGNOSIS — G5790 Unspecified mononeuropathy of unspecified lower limb: Secondary | ICD-10-CM

## 2014-04-15 DIAGNOSIS — R071 Chest pain on breathing: Secondary | ICD-10-CM

## 2014-04-15 DIAGNOSIS — C50419 Malignant neoplasm of upper-outer quadrant of unspecified female breast: Secondary | ICD-10-CM

## 2014-04-15 DIAGNOSIS — D0590 Unspecified type of carcinoma in situ of unspecified breast: Secondary | ICD-10-CM

## 2014-04-15 MED ORDER — ANASTROZOLE 1 MG PO TABS: 1.0000 mg | ORAL_TABLET | Freq: Every day | ORAL | Status: DC

## 2014-04-15 NOTE — Progress Notes (Signed)
Counselor met with patient today and continued to discuss life post-treatment. Patient is greatly looking forward to reestablishing finances in order to secure own housing. Patient has appt with dr this afternoon and will discuss physical healing process.  [place] Woodroe Vogan Counseling Intern [phone]

## 2014-04-15 NOTE — Progress Notes (Signed)
[place]  Telephone:(336) [phone] Fax:([phone]) [phone]     ID: FLORINDA TAFLINGER DOB: 1956/06/08  MR#: [id number]  NKN#:[id number]  Patient Care Team: Glendale Chard, MD as PCP - General (Internal Medicine) Fanny Skates, MD as Consulting Physician (General Surgery) Thea Silversmith, MD as Consulting Physician (Radiation Oncology)  CHIEF COMPLAINT: stage IIIA, estrogen receptor positive breast cancer  CURRENT TREATMENT: anastrozole   BREAST CANCER HISTORY: From Dr. Dana Allan original intake note, 04/04/2013:  "MALAIYAH ACHORN is a 58 y.o. female. With other medical problems who underwent a screening mammogram that led to a diagnostic mammogram and ultrasound of the left breast. The mammogram and ultrasound showed a 2.9 x 1.6 x 1.9 cm suspicious mass in the left breast at the 12:30 oh clock position 6 cm from the nipple. There was also a suspicious left axillary lymph node. She had image guided biopsy of the left breast mass and the lymph node. The pathology revealed an invasive mammary carcinoma probably ductal phenotype. Tumor was estrogen receptor +100% progesterone receptor +98% proliferation marker Ki-67 13% and HER-2/neu negative. Patient had MRI of the breasts performed on 03/15/2013 the left breast revealed a 7.4 cm area of what the lymph node. Right breast revealed and a 3.3 cm mass. Patient was seen by Dr. Fanny Skates for discussion of surgical options. He has referred the patient to medical oncology for discussion whether or not she will need a Port-A-Cath and chemotherapy."  The patient underwent left modified radical mastectomy 04/22/2014 showing multiple foci of low-grade invasive ductal carcinoma, the largest single lesion measuring 5.5 cm, and involving 4/22 lymph nodes. Repeat HER-2 study was again negative. 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 The patient proceeded to adjuvant chemotherapy completed May 2015 as summarized below. She is currently receiving radiation therapy.  The patient's subsequent history is detailed below.  INTERVAL HISTORY: Glenetta returns today for follow-up of her stage III breast cancer. Since her last visit here she completed her radiation treatments. She had significant fatigue and her skin is still itchy and dry as well as hyperpigmented. However "it's over". She is "ready for the next that".  REVIEW OF SYSTEMS: Terilynn is doing some walking with her husband, but she has significant residual peripheral neuropathy involving the right foot more than the left. Her feet feel swollen, even though they aren't. They don't single are heard. She still has a little bit of blurred vision. She worries about her diabetes. Otherwise a detailed review of systems today was noncontributory  PAST MEDICAL HISTORY: Past Medical History  Diagnosis Date  . Hyperlipidemia   . Hypertension   . Bipolar 1 disorder   . Cancer   . Hypertension 04/04/2013  . Hyperlipidemia 04/04/2013  . Bipolar 1 disorder 04/04/2013  . Breast cancer 03/10/13    left breast invasive ca  . Allergy     latex  . Obesity   . Diabetes mellitus without complication     NIDDM  . Sleep apnea     no cpap due to insurance  . Heart murmur   . GERD (gastroesophageal reflux disease)   . Coronary artery disease   . Radiation 01/30/14-03/19/14    PAST SURGICAL HISTORY: Past Surgical History  Procedure Laterality Date  .  Cholecystectomy    . Breast biopsy Left 05/08/09    fibroadenoma with microcalcifications,no malignancy id  . Breast biopsy Left 03/07/13    invasive ca,1 lymph node metastatic  . 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 Abdominal hysterectomy     . Coronary stent placement  2001  . Cardiac catheterization    . Eye surgery  6/13,9/14    laser,cataract lft  . Coronary angioplasty      LAD stent 2003  . Portacath placement Right 04/22/2013    Procedure: INSERTION PORT-A-CATH;  Surgeon: Adin Hector, MD;  Location: [place];  Service: General;  Laterality: Right;  . Mastectomy modified radical Left 04/22/2013    Procedure: MASTECTOMY MODIFIED RADICAL;  Surgeon: Adin Hector, MD;  Location: [place];  Service: General;  Laterality: Left;  . Breast lumpectomy with needle localization and axillary sentinel lymph node bx Right 04/22/2013    Procedure: BREAST LUMPECTOMY WITH NEEDLE LOCALIZATION AND AXILLARY SENTINEL LYMPH NODE BX;  Surgeon: Adin Hector, MD;  Location: [facility];  Service: General;  Laterality: Right;  . Esophagogastroduodenoscopy N/A 08/07/2013    Procedure: ESOPHAGOGASTRODUODENOSCOPY (EGD);  Surgeon: Missy Sabins, MD;  Location: [facility];  Service: Endoscopy;  Laterality: N/A;  . Esophagogastroduodenoscopy N/A 11/13/2013    Procedure: ESOPHAGOGASTRODUODENOSCOPY (EGD);  Surgeon: Wonda Horner, MD;  Location: [facility];  Service: Endoscopy;  Laterality: N/A;    FAMILY HISTORY Family History  Problem Relation Age of Onset  . Heart disease Mother   . Breast cancer Maternal Aunt     dx in her 39s  . Heart disease Cousin 45    maternal cousin  . Heart attack Sister 74  . Cancer Paternal Aunt     NOS  . Cancer Maternal Aunt     NOS  . Lung cancer Cousin 71    maternal cousin - non smoker  . Brain cancer Cousin 13    maternal cousin's son  . Lung cancer Cousin 5    maternal cousin's son  . Breast cancer Cousin     paternal cousin   the patient's father died from alcoholic cirrhosis at the age of 16. The patient's mother died from congestive heart failure the age of 44. The patient had no brothers, 4 sisters. One sister died from a myocardial infarction at the age of 89. The only other breast cancers in the  family are a maternal aunt(Otto 55) with breast cancer diagnosed after the age of 98 and a second cousin on the mother's side, diagnosed with breast cancer approximately age 100. There is no history of ovarian cancer in the family. The patient has undergone genetic testing and his BRCA negative  GYNECOLOGIC HISTORY:  No LMP recorded. Patient is postmenopausal. Menarche age 89. The patient is GX P0. She underwent hysterectomy with bilateral salpingo-oophorectomy in 1997. She took hormone replacement briefly.  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 She is single. Currently she shares a home with a friend, but is looking for her own place. She attends a local church of [place]: Not in place. At her 02/12/2014 visit the patient was given the appropriate documents to complete and notarize at her discretion. She tells me she intends to name her goddaughter,, Gerlene Burdock, who is a CNA, as her healthcare power of attorney. Ms. Ky Barban can be reached at [place]: History  Substance Use Topics  . Smoking status: Former Smoker -- 1.00 packs/day for 15 years    Types: Cigarettes    Quit date: 07/19/1987  . Smokeless tobacco: Never Used  . Alcohol Use: No     Colonoscopy: Never  PAP:  Bone density: Never  Lipid panel:  Allergies  Allergen Reactions  . Cephalexin Other (See Comments)    Burned throat.  . Adhesive [Tape] Rash    Clear tape gloves ,elastic no problem  . Latex Hives and Rash    Current Outpatient Prescriptions  Medication Sig Dispense Refill  . amLODipine (NORVASC) 10 MG tablet Take 10 mg by mouth daily.      [name] b complex vitamins tablet Take 1 tablet by mouth daily.      [name] buPROPion (WELLBUTRIN SR) 150 MG 12 hr tablet Take 1 tablet (150 mg total) by mouth daily.  30 tablet  6  . Cholecalciferol (VITAMIN D PO) Take 1 tablet by mouth daily.      . fluticasone (FLONASE) 50 MCG/ACT  nasal spray Place 1 spray into both nostrils daily.  9.9 g  0  . furosemide (LASIX) 40 MG tablet Take 1 tablet (40 mg total) by mouth daily.  30 tablet  0  . gabapentin (NEURONTIN) 100 MG capsule Take 3 capsules (300 mg total) by mouth 3 (three) times daily.  870 capsule  6  . hyaluronate sodium (RADIAPLEXRX) GEL Apply 1 application topically 2 (two) times daily.      . hydrALAZINE (APRESOLINE) 25 MG tablet Take 50 mg by mouth 3 (three) times daily.       [name] HYDROcodone-acetaminophen (NORCO/VICODIN) 5-325 MG per tablet Take 1 tablet by mouth at bedtime as needed for moderate pain.  30 tablet  0  . lidocaine-prilocaine (EMLA) cream Apply topically as needed. Apply to port-a-cath one - two hours before accessing.  30 g  3  . LORazepam (ATIVAN) 0.5 MG tablet Take 1 tablet (0.$RemoveBef'5mg'POnigxycvt$ ) every 6 hours as needed for nausea or vomiting.  30 tablet  2  . losartan (COZAAR) 100 MG tablet Take 1 tablet (100 mg total) by mouth daily.  90 tablet  0  . metoprolol succinate (TOPROL-XL) 25 MG 24 hr tablet Take 25 mg by mouth daily.       . ondansetron (ZOFRAN) 4 MG tablet Take 1 tablet (4 mg total) by mouth every 6 (six) hours.  12 tablet  0  . pantoprazole (PROTONIX) 40 MG tablet Take 1 tablet (40 mg total) by mouth daily.  30 tablet  3  . pioglitazone-metformin (ACTOPLUS MET) 15-850 MG per tablet Take 1 tablet by mouth every morning.       . potassium chloride SA (K-DUR,KLOR-CON) 20 MEQ tablet Take 1 tablet (20 mEq total) by mouth 2 (two) times daily.  60 tablet  0  . rosuvastatin (CRESTOR) 5 MG tablet Take 5 mg by mouth daily.      . sitaGLIPtin (JANUVIA) 100 MG tablet Take 100 mg by mouth daily.       No current facility-administered medications for this visit.   Facility-Administered Medications Ordered in Other Visits  Medication Dose Route Frequency Provider Last Rate Last Dose  . sodium chloride 0.9 % injection 10 mL  10 mL Intravenous PRN [name], MD   10 mL at 05/24/13 1522    OBJECTIVE:  Middle-aged woman who appears stated age 3 Vitals:   04/15/14 1646  BP: 195/80  Pulse: 81  Temp: 98.4 F (36.9 C)  Resp: 18     Body mass index is 35.04 kg/(m^2).    ECOG FS:1 - Symptomatic but completely ambulatory  Sclerae unicteric, pupils equal and reactive Oropharynx clear and moist No cervical or supraclavicular adenopathy Lungs no rales or rhonchi Heart regular rate and rhythm Abd soft, nontender, positive bowel sounds MSK no focal spinal tenderness, no upper extremity lymphedema Neuro: nonfocal, well oriented, appropriate affect Breasts: The right breast is status post lumpectomy. There is no evidence of local recurrence. The right axilla is benign. Left breast is status post mastectomy. It is also status post radiation. There is significant hyperpigmentation, but no desquamation, or the radiation port. In the axillae there is a small white head which does not appear infected. There are no palpable findings in the axilla otherwise.   LAB RESULTS:  CMP     Component Value Date/Time   NA 141 04/08/2014 0925   NA 138 02/13/2014 2308   K 3.3* 04/08/2014 0925   K 3.5* 02/13/2014 2308   CL 105 02/13/2014 2308   CO2 25 04/08/2014 0925   CO2 22 02/13/2014 2308   GLUCOSE 251* 04/08/2014 0925   GLUCOSE 99 02/13/2014 2308   BUN 13.9 04/08/2014 0925   BUN 10 02/13/2014 2308   CREATININE 0.9 04/08/2014 0925   CREATININE 0.70 02/13/2014 2308   CALCIUM 9.6 04/08/2014 0925   CALCIUM 9.0 02/13/2014 2308   PROT 7.0 04/08/2014 0925   PROT 6.3 02/13/2014 2308   ALBUMIN 3.7 04/08/2014 0925   ALBUMIN 3.4* 02/13/2014 2308   AST 13 04/08/2014 0925   AST 14 02/13/2014 2308   ALT 8 04/08/2014 0925   ALT 8 02/13/2014 2308   ALKPHOS 108 04/08/2014 0925   ALKPHOS 92 02/13/2014 2308   BILITOT 0.45 04/08/2014 0925   BILITOT 0.3 02/13/2014 2308   GFRNONAA >90 02/13/2014 2308   GFRAA >90 02/13/2014 2308    I No results found for this basename: SPEP,  UPEP,   kappa and lambda light chains    Lab Results    Component Value Date   WBC 4.4 04/08/2014   NEUTROABS 3.0 04/08/2014   HGB 11.6 04/08/2014   HCT 34.9 04/08/2014   MCV 89.3 04/08/2014   PLT 250 04/08/2014      Chemistry      Component Value Date/Time   NA 141 04/08/2014 0925   NA 138 02/13/2014 2308   K 3.3* 04/08/2014 0925   K 3.5* 02/13/2014 2308   CL 105 02/13/2014 2308   CO2 25 04/08/2014 0925   CO2 22 02/13/2014 2308   BUN 13.9 04/08/2014 0925   BUN 10 02/13/2014 2308   CREATININE 0.9 04/08/2014 0925   CREATININE 0.70 02/13/2014 2308      Component Value Date/Time   CALCIUM 9.6 04/08/2014 0925   CALCIUM 9.0 02/13/2014 2308   ALKPHOS 108 04/08/2014 0925   ALKPHOS 92 02/13/2014 2308   AST 13 04/08/2014 0925   AST 14 02/13/2014 2308   ALT 8 04/08/2014 0925   ALT 8 02/13/2014 2308   BILITOT 0.45 04/08/2014 0925   BILITOT 0.3 02/13/2014 2308       No results found for this basename: LABCA2    No components found with this basename: LABCA125    No results found for this basename: INR,  in the last 168 hours  Urinalysis    Component Value Date/Time   COLORURINE YELLOW 11/11/2013 1054   APPEARANCEUR CLOUDY* 11/11/2013 1054   LABSPEC 1.027 11/11/2013 1054   PHURINE 5.5  11/11/2013 1054   GLUCOSEU >1000* 11/11/2013 1054   HGBUR TRACE* 11/11/2013 1054   BILIRUBINUR SMALL* 11/11/2013 1054   KETONESUR 15* 11/11/2013 1054   PROTEINUR >300* 11/11/2013 1054   UROBILINOGEN 1.0 11/11/2013 1054   NITRITE NEGATIVE 11/11/2013 1054   LEUKOCYTESUR SMALL* 11/11/2013 1054    STUDIES: CLINICAL DATA: Postmenopausal. History of previous chemotherapy for  cancer.  EXAM:  DUAL X-RAY ABSORPTIOMETRY (DXA) FOR BONE MINERAL DENSITY  FINDINGS:  AP LUMBAR SPINE L1 and L3  Bone Mineral Density (BMD): 0.855 g/cm2  Young Adult T-Score: -1.4  Z-Score: -0.8  LEFT FEMUR NECK  Bone Mineral Density (BMD): 0.700 g/cm2  Young Adult T-Score: -1.3  Z-Score: -0.7  ASSESSMENT: Patient's diagnostic category is LOW BONE MASS by WHO  Criteria.  FRAX: Based on the Barry, the 10 year  probability of a major osteoporotic fracture is 3.2%. The 10 year  probability of a hip fracture is 0.2%.  COMPARISON: None.    ASSESSMENT: 58 y.o. BRCA negative [place] woman  LEFT BREAST (1) status post left breast upper outer quadrant and left axillary lymph node biopsy 03/07/2013, both positive for an invasive ductal carcinoma, grade 1 or 2, estrogen receptor and progesterone receptor both strongly positive, HER-2 negative, with an MIB-1 between 13% and 15%  (2) biopsy of a more anterior mass in the left breast 04/03/2013 showed invasive ductal carcinoma, grade 2, estrogen and progesterone receptor positive, HER-2 negative, with an MIB-1 of 33%  (3) status post left modified radical mastectomy 04/22/2013 for mpT3 pN2, stage IIIA invasive ductal carcinoma, grade 1, repeat HER-2 again negative; 4 of 22 lymph nodes positive; ample margins   RIGHT BREAST (4) status post right central breast biopsy 04/03/2013 for ductal carcinoma in situ, 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% positive, progesterone receptor 11% positive   (5) right lumpectomy and sentinel lymph node sampling 04/22/2013 showed ductal carcinoma in situ, 3 cm, margins less than 1 mm medially  ADJUVANT CHEMOTHERAPY (6) cyclophosphamide, epirubicin, and fluorouracil given every 14 days x6 cycles completed 08/02/2013  (7) weekly paclitaxel x2, discontinued 08/23/2013, with poor tolerance   (8) Abraxane given day 1 day 8 of each 21 day cycle, with some delays, for a total 8 doses, completed 11/23/2023  ADJUVANT RADIATION (9) adjuvant radiation therapy completed 03/19/2014  ANTI-ESTROGEN THERAPY (10) started anastrozole 04/16/2014; dexa scan 04/03/2014 showed mild osteopenia (T - 1.4)  GENETIC TESTING (11) a broad genetic panel sent December 2014 showed a variant of uncertain significance, 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  There were no deleterious mutations noted in the other genes tested, which included  ATM, BARD1, BRCA1, BRCA2, BRIP1, CDH1, CHEK2, EPCAM, FANCC, MLH1, MSH2, MSH6, NBN, PALB2, PMS2, PTEN, RAD51C, RAD51D, STK11, TP53, and XRCC2.    PLAN: Harmoni is recovering nicely from her radiation. She is now ready to start antiestrogen meds. We discussed how these drugs work, the possible side effects, toxicities and complications, as well as cost issues.  I think she would be a good candidate for anastrozole. She has a good initial bone density. She has a ready learn how to deal with hot flashes and vaginal dryness which are the main side effects of this drug aside from the few patients (1 in 5 or so) would develop arthralgias and myalgias.  I went ahead and wrote her the prescription and made her an appointment to see me again in 2 months. If she is tolerating it well we will initiate long-term followup at that time and the plan will be to  continue antiestrogen for total of 5 years.  She is still having significant discomfort in the left chest wall area. I suggested she try Aleve 2 tablets 3 times a day with food and if that does not take care of the problem she will let us know and we will consider tramadol. She understands that I am reluctant to use narcotics for chronic pain problems.  Treva has a good understanding of the overall plan. She agrees with that. She knows a goal of treatment in her case is cure. She will call with any problems that may develop before her next visit here. Chauncey Cruel, MD   04/15/2014 4:59 PM

## 2014-04-15 NOTE — Progress Notes (Signed)
Department of Radiation Oncology  Phone:  [phone] Fax:        [phone]   Name: Kathy Maxwell MRN: [id number]  DOB: 09-07-1955  Date: 04/15/2014  Follow Up Visit Note  Diagnosis: Bilateral breast cancer (s/p mastectomy on the left, lumpectomy on the right)  Summary and Interval since last radiation: 1 month  Interval History: Kathy Maxwell presents today for routine followup.  Her skin has healed up. She continues to use the biafene. She has itching on her back the most. She has an appointment with medical oncology following this visit.   Allergies:  Allergies  Allergen Reactions  . Cephalexin Other (See Comments)    Burned throat.  . Adhesive [Tape] Rash    Clear tape gloves ,elastic no problem  . Latex Hives and Rash    Medications:  Current Outpatient Prescriptions  Medication Sig Dispense Refill  . amLODipine (NORVASC) 10 MG tablet Take 10 mg by mouth daily.      [name] anastrozole (ARIMIDEX) 1 MG tablet Take 1 tablet (1 mg total) by mouth daily.  90 tablet  4  . b complex vitamins tablet Take 1 tablet by mouth daily.      [name] buPROPion (WELLBUTRIN SR) 150 MG 12 hr tablet Take 1 tablet (150 mg total) by mouth daily.  30 tablet  6  . Cholecalciferol (VITAMIN D PO) Take 1 tablet by mouth daily.      . fluticasone (FLONASE) 50 MCG/ACT nasal spray Place 1 spray into both nostrils daily.  9.9 g  0  . furosemide (LASIX) 40 MG tablet Take 1 tablet (40 mg total) by mouth daily.  30 tablet  0  . gabapentin (NEURONTIN) 100 MG capsule Take 3 capsules (300 mg total) by mouth 3 (three) times daily.  870 capsule  6  . hyaluronate sodium (RADIAPLEXRX) GEL Apply 1 application topically 2 (two) times daily.      . hydrALAZINE (APRESOLINE) 25 MG tablet Take 50 mg by mouth 3 (three) times daily.       [name] HYDROcodone-acetaminophen (NORCO/VICODIN) 5-325 MG per tablet Take 1 tablet by mouth at bedtime as needed for moderate pain.  30 tablet  0  . lidocaine-prilocaine (EMLA) cream Apply topically as  needed. Apply to port-a-cath one - two hours before accessing.  30 g  3  . LORazepam (ATIVAN) 0.5 MG tablet Take 1 tablet (0.72m) every 6 hours as needed for nausea or vomiting.  30 tablet  2  . losartan (COZAAR) 100 MG tablet Take 1 tablet (100 mg total) by mouth daily.  90 tablet  0  . metoprolol succinate (TOPROL-XL) 25 MG 24 hr tablet Take 25 mg by mouth daily.       . ondansetron (ZOFRAN) 4 MG tablet Take 1 tablet (4 mg total) by mouth every 6 (six) hours.  12 tablet  0  . pantoprazole (PROTONIX) 40 MG tablet Take 1 tablet (40 mg total) by mouth daily.  30 tablet  3  . pioglitazone-metformin (ACTOPLUS MET) 15-850 MG per tablet Take 1 tablet by mouth every morning.       . potassium chloride SA (K-DUR,KLOR-CON) 20 MEQ tablet Take 1 tablet (20 mEq total) by mouth 2 (two) times daily.  60 tablet  0  . rosuvastatin (CRESTOR) 5 MG tablet Take 5 mg by mouth daily.      . sitaGLIPtin (JANUVIA) 100 MG tablet Take 100 mg by mouth daily.       No current facility-administered medications for this encounter.  Facility-Administered Medications Ordered in Other Encounters  Medication Dose Route Frequency Provider Last Rate Last Dose  . sodium chloride 0.9 % injection 10 mL  10 mL Intravenous PRN [name], MD   10 mL at 05/24/13 1522    Physical Exam:  Filed Vitals:   04/15/14 1622 04/15/14 1633  BP: 213/84 193/84  Pulse: 81 84  Resp: 18 18  Dark left chest wall. Small pustule/ingrown hair under right axilla. No pus. No erythema. Tender to palpation. Dark skin but intact over back.   IMPRESSION: Kathy Maxwell is a 58 y.o. female s/p radiation with resolving acute effects of treatment.   PLAN:  Switch to regular lotion with vitamin e. Also gave samples of aquaphor to help with moisturizing. Follow up in 2 weeks. Monitor area under her left arm.  Follow up with surgery as directed.     Thea Silversmith, MD

## 2014-04-21 ENCOUNTER — Telehealth: Payer: Self-pay | Admitting: Oncology

## 2014-04-21 NOTE — Telephone Encounter (Signed)
, °

## 2014-05-01 ENCOUNTER — Ambulatory Visit
Admission: RE | Admit: 2014-05-01 | Discharge: 2014-05-01 | Disposition: A | Discharge status: Home / Self Care | Payer: Medicare HMO | Source: Ambulatory Visit | Attending: Radiation Oncology | Admitting: Radiation Oncology

## 2014-05-01 VITALS — BP 175/75 | HR 85 | Temp 98.1°F | Wt 195.1 lb

## 2014-05-01 DIAGNOSIS — C50412 Malignant neoplasm of upper-outer quadrant of left female breast: Secondary | ICD-10-CM

## 2014-05-01 NOTE — Progress Notes (Signed)
Patient for  Routine one month follow up radiation bilateral chest wall breast cancer.Continues to have significant hyperpigmentation.Major concern today is pain over left scapula and continued itching despite extra moisturizer and diphenhydramine.Told to try mild laundry detergent, extra rinse cycle and allergy medication.Dr.Wentworth informed in case different oral med needed.Start lotion with vitamin e.

## 2014-05-02 ENCOUNTER — Telehealth: Payer: Self-pay | Admitting: *Deleted

## 2014-05-02 ENCOUNTER — Other Ambulatory Visit: Payer: Self-pay | Admitting: *Deleted

## 2014-05-02 MED ORDER — ANASTROZOLE 1 MG PO TABS: 1.0000 mg | ORAL_TABLET | Freq: Every day | ORAL | Status: DC

## 2014-05-02 NOTE — Progress Notes (Signed)
   Department of Radiation Oncology  Phone:  [phone] Fax:        [phone]   Name: Kathy Maxwell MRN: [id number]  DOB: 03-03-1956  Date: 05/01/2014  Follow Up Visit Note  Diagnosis:    ICD-9-CM ICD-10-CM  1. Breast cancer of upper-outer quadrant of left female breast 174.4 C50.412    Summary and Interval since last radiation: 1 month  Interval History: Kathy Maxwell presents today for routine followup.  She continues to have itching over her entire body but still worse along her back and in particular a mole along her left shoulderblad. She is using bendaryl and lotion and still is itching. She also has pain over left back and left anerior chest. Worse with stretching or pulling. Would like to start back to exercising (silver sneakers). Taking anastraole. No change in detergent or soap.   Physical Exam:  Filed Vitals:   05/01/14 1635  BP: 175/75  Pulse: 85  Temp: 98.1 F (36.7 C)  Weight: 195 lb 1.6 oz (88.497 kg)   Right breas tis healed well. Dark hyperpigmentation over left chest wall and back. No signs of recurrence  IMPRESSION: Kathy Maxwell is a 58 y.o. female s.p bilateral radiation with itching  PLAN:  We discussed using lotion with Viatmin E. We discussed trying Claritin.  She also may be allergic to the anastrazole with skin irritation. I asked her to follow up with medical oncology regarding this although the itching started slightly before she started the AI. I will refer her to dermatology to investigate her back lesion and the itching. She should start vitamin e to her chest wall for hyperpgimentation. [facility] I will see her back prn.     Thea Silversmith, MD

## 2014-05-02 NOTE — Telephone Encounter (Signed)
Called patient to inform of appt. With Dr. Elvera Lennox [facility]) on 05-08-14 - arrival time - 2 pm, spoke with patient and she is aware of this appt.

## 2014-05-14 ENCOUNTER — Ambulatory Visit (HOSPITAL_BASED_OUTPATIENT_CLINIC_OR_DEPARTMENT_OTHER): Payer: Medicare HMO | Admitting: Nurse Practitioner

## 2014-05-14 ENCOUNTER — Telehealth: Payer: Self-pay | Admitting: *Deleted

## 2014-05-14 VITALS — BP 193/67 | HR 85 | Temp 98.0°F | Resp 18 | Ht 62.0 in | Wt 196.4 lb

## 2014-05-14 DIAGNOSIS — D0591 Unspecified type of carcinoma in situ of right breast: Secondary | ICD-10-CM

## 2014-05-14 DIAGNOSIS — L03313 Cellulitis of chest wall: Secondary | ICD-10-CM

## 2014-05-14 DIAGNOSIS — L0390 Cellulitis, unspecified: Secondary | ICD-10-CM

## 2014-05-14 DIAGNOSIS — L03818 Cellulitis of other sites: Secondary | ICD-10-CM

## 2014-05-14 DIAGNOSIS — I10 Essential (primary) hypertension: Secondary | ICD-10-CM

## 2014-05-14 MED ORDER — FLUCONAZOLE 150 MG PO TABS: 150.0000 mg | ORAL_TABLET | Freq: Once | ORAL | Status: DC

## 2014-05-14 MED ORDER — DOXYCYCLINE HYCLATE 100 MG PO TABS: 100.0000 mg | ORAL_TABLET | Freq: Two times a day (BID) | ORAL | Status: DC

## 2014-05-14 MED ORDER — HYDROCODONE-ACETAMINOPHEN 5-325 MG PO TABS: 1.0000 | ORAL_TABLET | Freq: Four times a day (QID) | ORAL | Status: DC | PRN

## 2014-05-14 NOTE — Assessment & Plan Note (Addendum)
Patient's  right breast extremely tender to palpation; with some mild warmth also noted.  No specific erythema or red streaks noted to site.  Was able to palpate questionable mass/abscess area to right breast at the 8:00 position.  Patient is allergic to cephalexin.  Patient was prescribed doxycycline for probable cellulitis and possible breast abscess.  Patient requested a prescription for Diflucan to use if she develops any yeast infection symptoms secondary to anabolic use.  Patient was also given a prescription for hydrocodone to use on an as needed basis as well.  Advised patient will also schedule a right breast ultrasound for further evaluation.

## 2014-05-14 NOTE — Assessment & Plan Note (Addendum)
Patient is status post left mastectomy and right lumpectomy in October 2014.  She completed bilateral breast radiation therapy approximate one month ago.  She continues to take anastrozole on a daily basis. She has plans for labs and a follow up visit on 06/19/2014.

## 2014-05-14 NOTE — Telephone Encounter (Signed)
Pt called to this RN to state concern due to onset of " right breast pain- that started under my arm and now goes down to my nipple ".  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 Completed bilateral chest wall radiation approximately 1 month ago.  Per discussion and inquiry by this RN- pt states breast feels " warmer then usual and slight tender to touch "  She does not note any swelling or change in skin color. She does not palpate any lumps or nodules.  " could this be related to the radiation ?"  This RN informed pt above may be but also may be cellulitis and this RN would inquire with MD.

## 2014-05-14 NOTE — Telephone Encounter (Signed)
Per MD review requested for pt to come in to symptom management clinic for assessment for appropriate therapy.  This RN spoke with pt - she states she currently does not have transportation but does feel by 330 someone will be available.  Appointment made for 330 with [employer].

## 2014-05-15 ENCOUNTER — Ambulatory Visit
Admission: RE | Admit: 2014-05-15 | Discharge: 2014-05-15 | Disposition: A | Discharge status: Home / Self Care | Payer: Medicare HMO | Source: Ambulatory Visit | Attending: Nurse Practitioner | Admitting: Nurse Practitioner

## 2014-05-15 ENCOUNTER — Telehealth: Payer: Self-pay | Admitting: *Deleted

## 2014-05-15 ENCOUNTER — Other Ambulatory Visit: Payer: Self-pay | Admitting: Nurse Practitioner

## 2014-05-15 ENCOUNTER — Other Ambulatory Visit: Payer: Self-pay | Admitting: *Deleted

## 2014-05-15 ENCOUNTER — Encounter: Payer: Self-pay | Admitting: Nurse Practitioner

## 2014-05-15 DIAGNOSIS — C50412 Malignant neoplasm of upper-outer quadrant of left female breast: Secondary | ICD-10-CM

## 2014-05-15 DIAGNOSIS — N6001 Solitary cyst of right breast: Secondary | ICD-10-CM

## 2014-05-15 NOTE — Assessment & Plan Note (Signed)
Patient with chronic history of hypertension.  Blood pressure today was elevated to 193/67.  Confirm the patient has been taking her antihypertensive medications as previously prescribed.  Advised patient to recheck her blood pressure which arrived back home; and let us know if it remains elevated.

## 2014-05-15 NOTE — Telephone Encounter (Signed)
Per Selena Lesser, NP pt needs to be scheduled for diagnostic ultrasound of the right breast to r/o abcess. Spoke with [facility]riam Jacobson at [facility] and they can fit pt in today at 2:15pm with 2pm arrival. Orders placed. Called pt and she is agreeable to go to the breast center for that appt. Pt states the pain in her right breast is 8/10 today. Encouraged pt to take the pain medicine as prescribed by Selena Lesser, NP yesterday before the ultrasound appt. Called back and spoke with Myriam Jacobson to confirm that pt will be there at 2pm today.

## 2014-05-15 NOTE — Progress Notes (Signed)
SYMPTOM MANAGEMENT CLINIC   HPI: Kathy Maxwell 58 y.o. female diagnosed with bilateral breast cancer.  Patient is status post left mastectomy and right lumpectomy in October 2014.  Currently undergoing anastrozole therapy on daily basis.  Patient is status post bilateral breast radiation therapy completed approximately month ago.  Patient called the cancer Center today requesting urgent care visit.  Patient reports developing severe pain to her right outer breast within the past 24 hours. She denies any known injury or trauma to this area.  She states the area feels slightly warm to her; but no erythema or edema noted.  Patient denies any recent fevers or chills.   HPI  CURRENT THERAPY: No active treatment plan for patient.   Review of Systems  Constitutional: Negative.   HENT: Negative.   Eyes: Negative.   Respiratory: Negative.   Cardiovascular: Negative.   Gastrointestinal: Negative.   Genitourinary: Negative.   Musculoskeletal: Negative.   Skin:       Tenderness to the right outer breast area  Neurological: Negative.   Endo/Heme/Allergies: Negative.   Psychiatric/Behavioral: Negative.   All other systems reviewed and are negative.   Past Medical History  Diagnosis Date  . Hyperlipidemia   . Hypertension   . Bipolar 1 disorder   . Cancer   . Hypertension 04/04/2013  . Hyperlipidemia 04/04/2013  . Bipolar 1 disorder 04/04/2013  . Breast cancer 03/10/13    left breast invasive ca  . Allergy     latex  . Obesity   . Diabetes mellitus without complication     NIDDM  . Sleep apnea     no cpap due to insurance  . Heart murmur   . GERD (gastroesophageal reflux disease)   . Coronary artery disease   . Radiation 01/30/14-03/19/14    Past Surgical History  Procedure Laterality Date  . Cholecystectomy    . Breast biopsy Left 05/08/09    fibroadenoma with microcalcifications,no malignancy id  . Breast biopsy Left 03/07/13    invasive ca,1 lymph node metastatic  .  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 Abdominal hysterectomy    . Coronary stent placement  2001  . Cardiac catheterization    . Eye surgery  6/13,9/14    laser,cataract lft  . Coronary angioplasty      LAD stent 2003  . Portacath placement Right 04/22/2013    Procedure: INSERTION PORT-A-CATH;  Surgeon: Adin Hector, MD;  Location: [place];  Service: General;  Laterality: Right;  . Mastectomy modified radical Left 04/22/2013    Procedure: MASTECTOMY MODIFIED RADICAL;  Surgeon: Adin Hector, MD;  Location: [place];  Service: General;  Laterality: Left;  . Breast lumpectomy with needle localization and axillary sentinel lymph node bx Right 04/22/2013    Procedure: BREAST LUMPECTOMY WITH NEEDLE LOCALIZATION AND AXILLARY SENTINEL LYMPH NODE BX;  Surgeon: Adin Hector, MD;  Location: [place];  Service: General;  Laterality: Right;  . Esophagogastroduodenoscopy N/A 08/07/2013    Procedure: ESOPHAGOGASTRODUODENOSCOPY (EGD);  Surgeon: Missy Sabins, MD;  Location: [facility];  Service: Endoscopy;  Laterality: N/A;  . Esophagogastroduodenoscopy N/A 11/13/2013    Procedure: ESOPHAGOGASTRODUODENOSCOPY (EGD);  Surgeon: Wonda Horner, MD;  Location: [facility];  Service: Endoscopy;  Laterality: N/A;    has Breast cancer of upper-outer quadrant of left female breast; Hypertension; Hyperlipidemia; Bipolar 1 disorder; Carcinoma in situ of right breast; Odynophagia; Chest pain; Chronic diastolic CHF (congestive heart failure); UTI (urinary  tract infection); and Cellulitis on her problem list.     is allergic to cephalexin; adhesive; and latex.    Medication List       This list is accurate as of: 05/14/14 11:59 PM.  Always use your most recent med list.               amLODipine 10 MG tablet  Commonly known as:  NORVASC  Take 10 mg by mouth daily.     anastrozole 1 MG tablet  Commonly known as:  ARIMIDEX  Take 1 tablet (1 mg total) by  mouth daily.     b complex vitamins tablet  Take 1 tablet by mouth daily.     buPROPion 150 MG 12 hr tablet  Commonly known as:  WELLBUTRIN SR  Take 1 tablet (150 mg total) by mouth daily.     doxycycline 100 MG tablet  Commonly known as:  VIBRA-TABS  Take 1 tablet (100 mg total) by mouth 2 (two) times daily.     fluconazole 150 MG tablet  Commonly known as:  DIFLUCAN  Take 1 tablet (150 mg total) by mouth once.     fluticasone 50 MCG/ACT nasal spray  Commonly known as:  FLONASE  Place 1 spray into both nostrils daily.     furosemide 40 MG tablet  Commonly known as:  LASIX  Take 1 tablet (40 mg total) by mouth daily.     gabapentin 100 MG capsule  Commonly known as:  NEURONTIN  Take 3 capsules (300 mg total) by mouth 3 (three) times daily.     hydrALAZINE 25 MG tablet  Commonly known as:  APRESOLINE  Take 50 mg by mouth 3 (three) times daily.     HYDROcodone-acetaminophen 5-325 MG per tablet  Commonly known as:  NORCO/VICODIN  Take 1-2 tablets by mouth every 6 (six) hours as needed for moderate pain.     JANUVIA 100 MG tablet  Generic drug:  sitaGLIPtin  Take 100 mg by mouth daily.     lidocaine-prilocaine cream  Commonly known as:  EMLA  Apply topically as needed. Apply to port-a-cath one - two hours before accessing.     LORazepam 0.5 MG tablet  Commonly known as:  ATIVAN  Take 1 tablet (0.80m) every 6 hours as needed for nausea or vomiting.     losartan 100 MG tablet  Commonly known as:  COZAAR  Take 1 tablet (100 mg total) by mouth daily.     metoprolol succinate 25 MG 24 hr tablet  Commonly known as:  TOPROL-XL  Take 25 mg by mouth daily.     naproxen sodium 220 MG tablet  Commonly known as:  ANAPROX  Take 220 mg by mouth 2 (two) times daily with a meal.     ondansetron 4 MG tablet  Commonly known as:  ZOFRAN  Take 1 tablet (4 mg total) by mouth every 6 (six) hours.     pantoprazole 40 MG tablet  Commonly known as:  PROTONIX  Take 1 tablet (40  mg total) by mouth daily.     pioglitazone-metformin 15-850 MG per tablet  Commonly known as:  ACTOPLUS MET  Take 1 tablet by mouth every morning.     rosuvastatin 5 MG tablet  Commonly known as:  CRESTOR  Take 5 mg by mouth daily.     VITAMIN D PO  Take 1 tablet by mouth daily.         PHYSICAL EXAMINATION  Blood pressure 193/67, pulse  85, temperature 98 F (36.7 C), temperature source Oral, resp. rate 18, height 5' 2" (1.575 m), weight 196 lb 6.4 oz (89.086 kg).  Physical Exam  Nursing note and vitals reviewed. Constitutional: She is oriented to person, place, and time and well-developed, well-nourished, and in no distress.  HENT:  Head: Normocephalic and atraumatic.  Mouth/Throat: Oropharynx is clear and moist.  Eyes: Conjunctivae and EOM are normal. Pupils are equal, round, and reactive to light. No scleral icterus.  Neck: Normal range of motion. Neck supple. No JVD present. No tracheal deviation present. No thyromegaly present.  Cardiovascular: Normal rate, regular rhythm, normal heart sounds and intact distal pulses.   Pulmonary/Chest: Effort normal and breath sounds normal. No respiratory distress. She has no wheezes.  Abdominal: Soft. Bowel sounds are normal. She exhibits no distension and no mass. There is no tenderness. There is no rebound and no guarding.  Musculoskeletal: Normal range of motion. She exhibits tenderness. She exhibits no edema.  Right breast at approximately 8:00 position with acute tenderness with any palpation whatsoever.  Questionable trace mass versus abscess noted with light palpation.  Trace warmth to site; but no edema or red streaks noted.  Patient does continue with full range of motion; but does complain of discomfort when lifting her right arm.  All pulses are palpable and all extremities are warm.  Lymphadenopathy:    She has no cervical adenopathy.  Neurological: She is alert and oriented to person, place, and time. Gait normal.  Skin: Skin  is warm and dry. No rash noted. There is erythema.  See previous note regarding right breast.  Psychiatric: Affect normal.    LABORATORY DATA:. No visits with results within 3 Day(s) from this visit. Latest known visit with results is:  Appointment on 04/08/2014  Component Date Value Ref Range Status  . WBC 04/08/2014 4.4  3.9 - 10.3 10e3/uL Final  . NEUT# 04/08/2014 3.0  1.5 - 6.5 10e3/uL Final  . HGB 04/08/2014 11.6  11.6 - 15.9 g/dL Final  . HCT 04/08/2014 34.9  34.8 - 46.6 % Final  . Platelets 04/08/2014 250  145 - 400 10e3/uL Final  . MCV 04/08/2014 89.3  79.5 - 101.0 fL Final  . MCH 04/08/2014 29.7  25.1 - 34.0 pg Final  . MCHC 04/08/2014 33.2  31.5 - 36.0 g/dL Final  . RBC 04/08/2014 3.91  3.70 - 5.45 10e6/uL Final  . RDW 04/08/2014 13.2  11.2 - 14.5 % Final  . lymph# 04/08/2014 0.9  0.9 - 3.3 10e3/uL Final  . MONO# 04/08/2014 0.4  0.1 - 0.9 10e3/uL Final  . Eosinophils Absolute 04/08/2014 0.1  0.0 - 0.5 10e3/uL Final  . Basophils Absolute 04/08/2014 0.0  0.0 - 0.1 10e3/uL Final  . NEUT% 04/08/2014 67.2  38.4 - 76.8 % Final  . LYMPH% 04/08/2014 19.8  14.0 - 49.7 % Final  . MONO% 04/08/2014 10.0  0.0 - 14.0 % Final  . EOS% 04/08/2014 2.5  0.0 - 7.0 % Final  . BASO% 04/08/2014 0.5  0.0 - 2.0 % Final  . Sodium 04/08/2014 141  136 - 145 mEq/L Final  . Potassium 04/08/2014 3.3* 3.5 - 5.1 mEq/L Final  . Chloride 04/08/2014 105  98 - 109 mEq/L Final  . CO2 04/08/2014 25  22 - 29 mEq/L Final  . Glucose 04/08/2014 251* 70 - 140 mg/dl Final  . BUN 04/08/2014 13.9  7.0 - 26.0 mg/dL Final  . Creatinine 04/08/2014 0.9  0.6 - 1.1 mg/dL Final  .  Total Bilirubin 04/08/2014 0.45  0.20 - 1.20 mg/dL Final  . Alkaline Phosphatase 04/08/2014 108  40 - 150 U/L Final  . AST 04/08/2014 13  5 - 34 U/L Final  . ALT 04/08/2014 8  0 - 55 U/L Final  . Total Protein 04/08/2014 7.0  6.4 - 8.3 g/dL Final  . Albumin 04/08/2014 3.7  3.5 - 5.0 g/dL Final  . Calcium 04/08/2014 9.6  8.4 - 10.4 mg/dL  Final  . Anion Gap 04/08/2014 11  3 - 11 mEq/L Final     RADIOGRAPHIC STUDIES: No results found.  ASSESSMENT/PLAN:    Hypertension  Assessment & Plan Patient with chronic history of hypertension.  Blood pressure today was elevated to 193/67.  Confirm the patient has been taking her antihypertensive medications as previously prescribed.  Advised patient to recheck her blood pressure which arrived back home; and let us know if it remains elevated.   Carcinoma in situ of right breast  Assessment & Plan Patient is status post left mastectomy and right lumpectomy in October 2014.  She completed bilateral breast radiation therapy approximate one month ago.  She continues to take anastrozole on a daily basis. She has plans for labs and a follow up visit on 06/19/2014.   Cellulitis  Assessment & Plan Patient's  right breast extremely tender to palpation; with some mild warmth also noted.  No specific erythema or red streaks noted to site.  Was able to palpate questionable mass/abscess area to right breast at the 8:00 position.  Patient is allergic to cephalexin.  Patient was prescribed doxycycline for probable cellulitis and possible breast abscess.  Patient requested a prescription for Diflucan to use if she develops any yeast infection symptoms secondary to anabolic use.  Patient was also given a prescription for hydrocodone to use on an as needed basis as well.  Advised patient will also schedule a right breast ultrasound for further evaluation.   Patient stated understanding of all instructions; and was in agreement with this plan of care. The patient knows to call the clinic with any problems, questions or concerns.   Review/collaboration with Dr. Jana Hakim regarding all aspects of patient's visit today.   Total time spent with patient was 25 minutes;  with greater than 75 percent of that time spent in face to face counseling regarding her symptoms, and coordination of care and follow  up.  Disclaimer: This note was dictated with voice recognition software. Similar sounding words can inadvertently be transcribed and may not be corrected upon review.   Drue Second, NP 05/15/2014

## 2014-05-16 ENCOUNTER — Telehealth: Payer: Self-pay | Admitting: *Deleted

## 2014-05-16 NOTE — Telephone Encounter (Signed)
Called patient to follow up. Has [name] yesterday. Reminded to continue antibiotics. Pt to have mammo in 2 weeks

## 2014-05-20 ENCOUNTER — Other Ambulatory Visit: Payer: Self-pay | Admitting: Nurse Practitioner

## 2014-05-20 DIAGNOSIS — N611 Abscess of the breast and nipple: Secondary | ICD-10-CM

## 2014-05-30 ENCOUNTER — Other Ambulatory Visit: Payer: Medicare HMO

## 2014-06-02 ENCOUNTER — Ambulatory Visit
Admission: RE | Admit: 2014-06-02 | Discharge: 2014-06-02 | Disposition: A | Discharge status: Home / Self Care | Payer: Medicare HMO | Source: Ambulatory Visit | Attending: Nurse Practitioner | Admitting: Nurse Practitioner

## 2014-06-02 ENCOUNTER — Other Ambulatory Visit: Payer: Self-pay | Admitting: Oncology

## 2014-06-02 DIAGNOSIS — N611 Abscess of the breast and nipple: Secondary | ICD-10-CM

## 2014-06-03 ENCOUNTER — Telehealth (INDEPENDENT_AMBULATORY_CARE_PROVIDER_SITE_OTHER): Payer: Self-pay

## 2014-06-03 NOTE — Telephone Encounter (Signed)
Pt notified of need for appt in 2 weeks to ck area. Appt made for 06-17-14.

## 2014-06-03 NOTE — Telephone Encounter (Signed)
-----   Message from Fanny Skates, MD sent at 06/03/2014  6:43 AM EST ----- Kathy Maxwell,  This patient was seen in the office yesterday. Clinical exam suggested cellulitis of the lumpectomy incision or radiation changes. I gave her some antibiotics. On further review, her imaging studies show some concern for skin thickening and consideration for a punch biopsy to rule out inflammatory breast cancer. Please contact patient and arrange for her to follow-up with me in 2 weeks. If the skin thickening persists we will do a punch biopsy in the office.  hmi

## 2014-06-07 ENCOUNTER — Other Ambulatory Visit: Payer: Self-pay | Admitting: Oncology

## 2014-06-14 ENCOUNTER — Other Ambulatory Visit: Payer: Self-pay | Admitting: Oncology

## 2014-06-16 ENCOUNTER — Other Ambulatory Visit: Payer: Self-pay | Admitting: Oncology

## 2014-06-17 ENCOUNTER — Other Ambulatory Visit: Payer: Self-pay | Admitting: Dermatology

## 2014-06-17 ENCOUNTER — Other Ambulatory Visit (INDEPENDENT_AMBULATORY_CARE_PROVIDER_SITE_OTHER): Payer: Self-pay | Admitting: General Surgery

## 2014-06-18 ENCOUNTER — Other Ambulatory Visit: Payer: Self-pay | Admitting: *Deleted

## 2014-06-18 DIAGNOSIS — C50412 Malignant neoplasm of upper-outer quadrant of left female breast: Secondary | ICD-10-CM

## 2014-06-19 ENCOUNTER — Other Ambulatory Visit (HOSPITAL_BASED_OUTPATIENT_CLINIC_OR_DEPARTMENT_OTHER): Payer: Medicare HMO

## 2014-06-19 ENCOUNTER — Ambulatory Visit (HOSPITAL_BASED_OUTPATIENT_CLINIC_OR_DEPARTMENT_OTHER): Payer: Medicare HMO

## 2014-06-19 ENCOUNTER — Ambulatory Visit (HOSPITAL_BASED_OUTPATIENT_CLINIC_OR_DEPARTMENT_OTHER): Payer: Medicare HMO | Admitting: Oncology

## 2014-06-19 ENCOUNTER — Telehealth: Payer: Self-pay | Admitting: Nurse Practitioner

## 2014-06-19 VITALS — BP 111/45 | HR 79 | Temp 97.8°F | Resp 18

## 2014-06-19 VITALS — BP 93/40 | HR 81 | Temp 98.2°F | Resp 20 | Ht 62.0 in | Wt 195.2 lb

## 2014-06-19 DIAGNOSIS — I10 Essential (primary) hypertension: Secondary | ICD-10-CM

## 2014-06-19 DIAGNOSIS — F319 Bipolar disorder, unspecified: Secondary | ICD-10-CM

## 2014-06-19 DIAGNOSIS — D0591 Unspecified type of carcinoma in situ of right breast: Secondary | ICD-10-CM

## 2014-06-19 DIAGNOSIS — R031 Nonspecific low blood-pressure reading: Secondary | ICD-10-CM

## 2014-06-19 DIAGNOSIS — D0511 Intraductal carcinoma in situ of right breast: Secondary | ICD-10-CM

## 2014-06-19 DIAGNOSIS — C50412 Malignant neoplasm of upper-outer quadrant of left female breast: Secondary | ICD-10-CM

## 2014-06-19 DIAGNOSIS — Z170 Estrogen receptor positive status [ER+]: Secondary | ICD-10-CM

## 2014-06-19 DIAGNOSIS — C773 Secondary and unspecified malignant neoplasm of axilla and upper limb lymph nodes: Secondary | ICD-10-CM

## 2014-06-19 DIAGNOSIS — M899 Disorder of bone, unspecified: Secondary | ICD-10-CM

## 2014-06-19 LAB — CBC WITH DIFFERENTIAL/PLATELET
BASO%: 0.9 % (ref 0.0–2.0)
Basophils Absolute: 0 10*3/uL (ref 0.0–0.1)
EOS%: 3.5 % (ref 0.0–7.0)
Eosinophils Absolute: 0.2 10*3/uL (ref 0.0–0.5)
HCT: 32.7 % — ABNORMAL LOW (ref 34.8–46.6)
HGB: 10.6 g/dL — ABNORMAL LOW (ref 11.6–15.9)
LYMPH#: 0.9 10*3/uL (ref 0.9–3.3)
LYMPH%: 18.3 % (ref 14.0–49.7)
MCH: 30.6 pg (ref 25.1–34.0)
MCHC: 32.3 g/dL (ref 31.5–36.0)
MCV: 94.8 fL (ref 79.5–101.0)
MONO#: 0.4 10*3/uL (ref 0.1–0.9)
MONO%: 8.5 % (ref 0.0–14.0)
NEUT#: 3.4 10*3/uL (ref 1.5–6.5)
NEUT%: 68.8 % (ref 38.4–76.8)
Platelets: 294 10*3/uL (ref 145–400)
RBC: 3.45 10*6/uL — AB (ref 3.70–5.45)
RDW: 13.6 % (ref 11.2–14.5)
WBC: 4.9 10*3/uL (ref 3.9–10.3)

## 2014-06-19 LAB — COMPREHENSIVE METABOLIC PANEL (CC13)
ALT: 12 U/L (ref 0–55)
ANION GAP: 8 meq/L (ref 3–11)
AST: 15 U/L (ref 5–34)
Albumin: 4 g/dL (ref 3.5–5.0)
Alkaline Phosphatase: 114 U/L (ref 40–150)
BILIRUBIN TOTAL: 0.28 mg/dL (ref 0.20–1.20)
BUN: 39.3 mg/dL — ABNORMAL HIGH (ref 7.0–26.0)
CALCIUM: 9.7 mg/dL (ref 8.4–10.4)
CHLORIDE: 107 meq/L (ref 98–109)
CO2: 22 meq/L (ref 22–29)
CREATININE: 1.8 mg/dL — AB (ref 0.6–1.1)
EGFR: 35 mL/min/{1.73_m2} — ABNORMAL LOW (ref >90)
Glucose: 137 mg/dl (ref 70–140)
Potassium: 5 mEq/L (ref 3.5–5.1)
SODIUM: 137 meq/L (ref 136–145)
TOTAL PROTEIN: 7 g/dL (ref 6.4–8.3)

## 2014-06-19 MED ORDER — SODIUM CHLORIDE 0.9 % IV SOLN
1000.0000 mL | INTRAVENOUS | Status: AC
  Administered 2014-06-19: 1000 mL via INTRAVENOUS

## 2014-06-19 MED ORDER — SODIUM CHLORIDE 0.9 % IJ SOLN
10.0000 mL | Freq: Once | INTRAMUSCULAR | Status: AC
  Administered 2014-06-19: 10 mL
  Filled 2014-06-19: qty 10

## 2014-06-19 MED ORDER — HEPARIN SOD (PORK) LOCK FLUSH 100 UNIT/ML IV SOLN
500.0000 [IU] | Freq: Once | INTRAVENOUS | Status: AC
  Administered 2014-06-19: 500 [IU]
  Filled 2014-06-19: qty 5

## 2014-06-19 NOTE — Progress Notes (Signed)
[place]  Telephone:([phone]) [phone] Fax:(336) [phone]     ID: Kathy Maxwell DOB: 12/08/1955  MR#: [id number]  WVP#:[id number]  Patient Care Team: Glendale Chard, MD as PCP - General (Internal Medicine) Fanny Skates, MD as Consulting Physician (General Surgery) Thea Silversmith, MD as Consulting Physician (Radiation Oncology)  CHIEF COMPLAINT: stage IIIA, estrogen receptor positive breast cancer  CURRENT TREATMENT: anastrozole   BREAST CANCER HISTORY: From Dr. Dana Allan original intake note, 04/04/2013:  "Kathy Maxwell is a 58 y.o. female. With other medical problems who underwent a screening mammogram that led to a diagnostic mammogram and ultrasound of the left breast. The mammogram and ultrasound showed a 2.9 x 1.6 x 1.9 cm suspicious mass in the left breast at the 12:30 oh clock position 6 cm from the nipple. There was also a suspicious left axillary lymph node. She had image guided biopsy of the left breast mass and the lymph node. The pathology revealed an invasive mammary carcinoma probably ductal phenotype. Tumor was estrogen receptor +100% progesterone receptor +98% proliferation marker Ki-67 13% and HER-2/neu negative. Patient had MRI of the breasts performed on 03/15/2013 the left breast revealed a 7.4 cm area of what the lymph node. Right breast revealed and a 3.3 cm mass. Patient was seen by Dr. Fanny Skates for discussion of surgical options. He has referred the patient to medical oncology for discussion whether or not she will need a Port-A-Cath and chemotherapy."  The patient underwent left modified radical mastectomy 04/22/2014 showing multiple foci of low-grade invasive ductal carcinoma, the largest single lesion measuring 5.5 cm, and involving 4/22 lymph nodes. Repeat HER-2 study was again negative. 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 The patient proceeded to adjuvant chemotherapy completed May 2015 as summarized below. She is currently receiving radiation therapy.  The patient's subsequent history is detailed below.  INTERVAL HISTORY: Kathy Maxwell returns today for follow-up of her stage III breast cancer. Since her last visit here she was found to have some changes in the right breast most suggestive of cellulitis, but also raising the possibility of local recurrence. She underwent biopsy of this area 06/17/2014 under Dr Dalbert Batman. The pathology ([id number]) shows benign skin, no evidence of atypia or malignancy.   REVIEW OF SYSTEMS: Kathy Maxwell has been feeling dizzy and like she might pass out. She attributed this to vertigo, but when we got her blood pressure today it was quite low. She is on multiple blood pressure medicines for her previously very poorly controlled blood pressure. Aside from this she is tolerating the anastrozole well, with some hot flashes and vaginal dryness, but not significantly worsened from baseline. She has some "stress in her life", but she tells me it is not worse than usual. A detailed review of systems today was otherwise stable.  PAST MEDICAL HISTORY: Past Medical History  Diagnosis Date  . Hyperlipidemia   . Hypertension   . Bipolar 1 disorder   . Cancer   . Hypertension 04/04/2013  . Hyperlipidemia 04/04/2013  . Bipolar 1 disorder 04/04/2013  . Breast cancer 03/10/13    left breast invasive ca  . Allergy     latex  . Obesity   . Diabetes mellitus without complication     NIDDM  .  Sleep apnea     no cpap due to insurance  . Heart murmur   . GERD (gastroesophageal reflux disease)   . Coronary artery disease   . Radiation 01/30/14-03/19/14    PAST SURGICAL HISTORY: Past Surgical History  Procedure Laterality Date  . Cholecystectomy    . Breast  biopsy Left 05/08/09    fibroadenoma with microcalcifications,no malignancy id  . Breast biopsy Left 03/07/13    invasive ca,1 lymph node metastatic  . 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 Abdominal hysterectomy    . Coronary stent placement  2001  . Cardiac catheterization    . Eye surgery  6/13,9/14    laser,cataract lft  . Coronary angioplasty      LAD stent 2003  . Portacath placement Right 04/22/2013    Procedure: INSERTION PORT-A-CATH;  Surgeon: Adin Hector, MD;  Location: [facility];  Service: General;  Laterality: Right;  . Mastectomy modified radical Left 04/22/2013    Procedure: MASTECTOMY MODIFIED RADICAL;  Surgeon: Adin Hector, MD;  Location: [facility];  Service: General;  Laterality: Left;  . Breast lumpectomy with needle localization and axillary sentinel lymph node bx Right 04/22/2013    Procedure: BREAST LUMPECTOMY WITH NEEDLE LOCALIZATION AND AXILLARY SENTINEL LYMPH NODE BX;  Surgeon: Adin Hector, MD;  Location: [facility];  Service: General;  Laterality: Right;  . Esophagogastroduodenoscopy N/A 08/07/2013    Procedure: ESOPHAGOGASTRODUODENOSCOPY (EGD);  Surgeon: Missy Sabins, MD;  Location: [facility];  Service: Endoscopy;  Laterality: N/A;  . Esophagogastroduodenoscopy N/A 11/13/2013    Procedure: ESOPHAGOGASTRODUODENOSCOPY (EGD);  Surgeon: Wonda Horner, MD;  Location: [facility];  Service: Endoscopy;  Laterality: N/A;    FAMILY HISTORY Family History  Problem Relation Age of Onset  . Heart disease Mother   . Breast cancer Maternal Aunt     dx in her 92s  . Heart disease Cousin 35    maternal cousin  . Heart attack Sister 44  . Cancer Paternal Aunt     NOS  . Cancer Maternal Aunt     NOS  . Lung cancer Cousin 65    maternal cousin - non smoker  . Brain cancer Cousin 13    maternal cousin's son  . Lung cancer Cousin 64    maternal cousin's son  . Breast cancer Cousin     paternal cousin   the  patient's father died from alcoholic cirrhosis at the age of 70. The patient's mother died from congestive heart failure the age of 74. The patient had no brothers, 4 sisters. One sister died from a myocardial infarction at the age of 8. The only other breast cancers in the family are a maternal aunt(Otto 55) with breast cancer diagnosed after the age of 43 and a second cousin on the mother's side, diagnosed with breast cancer approximately age 34. There is no history of ovarian cancer in the family. The patient has undergone genetic testing and his BRCA negative  GYNECOLOGIC HISTORY:  No LMP recorded. Patient is postmenopausal. Menarche age 63. The patient is GX P0. She underwent hysterectomy with bilateral salpingo-oophorectomy in 1997. She took hormone replacement briefly.  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 She is single. Currently she shares a home with a friend, but is looking for her own place. She attends a local church of [place]: Not in place. At her 02/12/2014 visit  the patient was given the appropriate documents to complete and notarize at her discretion. She tells me she intends to name her goddaughter,, Kathy Maxwell, who is a CNA, as her healthcare power of attorney. Ms. Ky Barban can be reached at [place]: History  Substance Use Topics  . Smoking status: Former Smoker -- 1.00 packs/day for 15 years    Types: Cigarettes    Quit date: 07/19/1987  . Smokeless tobacco: Never Used  . Alcohol Use: No     Colonoscopy: Never  PAP:  Bone density: Never  Lipid panel:  Allergies  Allergen Reactions  . Cephalexin Other (See Comments)    Burned throat.  . Adhesive [Tape] Rash    Clear tape gloves ,elastic no problem  . Latex Hives and Rash    Current Outpatient Prescriptions  Medication Sig Dispense Refill  . amLODipine (NORVASC) 10 MG tablet Take 10 mg by mouth daily.    [name]  anastrozole (ARIMIDEX) 1 MG tablet Take 1 tablet (1 mg total) by mouth daily. 90 tablet 4  . b complex vitamins tablet Take 1 tablet by mouth daily.    [name] buPROPion (WELLBUTRIN SR) 150 MG 12 hr tablet Take 1 tablet (150 mg total) by mouth daily. 30 tablet 6  . Cholecalciferol (VITAMIN D PO) Take 1 tablet by mouth daily.    [name] doxycycline (VIBRA-TABS) 100 MG tablet Take 1 tablet (100 mg total) by mouth 2 (two) times daily. 20 tablet 0  . fluconazole (DIFLUCAN) 150 MG tablet Take 1 tablet (150 mg total) by mouth once. 1 tablet 1  . fluticasone (FLONASE) 50 MCG/ACT nasal spray Place 1 spray into both nostrils daily. 9.9 g 0  . furosemide (LASIX) 40 MG tablet Take 1 tablet (40 mg total) by mouth daily. 30 tablet 0  . gabapentin (NEURONTIN) 100 MG capsule Take 3 capsules (300 mg total) by mouth 3 (three) times daily. 870 capsule 6  . hydrALAZINE (APRESOLINE) 25 MG tablet Take 50 mg by mouth 3 (three) times daily.     [name] HYDROcodone-acetaminophen (NORCO/VICODIN) 5-325 MG per tablet Take 1-2 tablets by mouth every 6 (six) hours as needed for moderate pain. 30 tablet 0  . lidocaine-prilocaine (EMLA) cream Apply topically as needed. Apply to port-a-cath one - two hours before accessing. 30 g 3  . LORazepam (ATIVAN) 0.5 MG tablet Take 1 tablet (0.$RemoveBef'5mg'xLmIPHbzjG$ ) every 6 hours as needed for nausea or vomiting. 30 tablet 2  . losartan (COZAAR) 100 MG tablet Take 1 tablet (100 mg total) by mouth daily. 90 tablet 0  . metoprolol succinate (TOPROL-XL) 25 MG 24 hr tablet Take 25 mg by mouth daily.     . naproxen sodium (ANAPROX) 220 MG tablet Take 220 mg by mouth 2 (two) times daily with a meal.    . ondansetron (ZOFRAN) 4 MG tablet Take 1 tablet (4 mg total) by mouth every 6 (six) hours. 12 tablet 0  . pantoprazole (PROTONIX) 40 MG tablet Take 1 tablet (40 mg total) by mouth daily. 30 tablet 3  . pioglitazone-metformin (ACTOPLUS MET) 15-850 MG per tablet Take 1 tablet by mouth every morning.     . rosuvastatin (CRESTOR) 5 MG  tablet Take 5 mg by mouth daily.    . sitaGLIPtin (JANUVIA) 100 MG tablet Take 100 mg by mouth daily.     No current facility-administered medications for this visit.   Facility-Administered Medications Ordered in Other Visits  Medication Dose Route Frequency Provider Last Rate Last Dose  .  sodium chloride 0.9 % injection 10 mL  10 mL Intravenous PRN [name], MD   10 mL at 05/24/13 1522    OBJECTIVE: Middle-aged woman who appears fatigued Filed Vitals:   06/19/14 1041  BP: 93/40  Pulse: 81  Temp: 98.2 F (36.8 C)  Resp: 20     Body mass index is 35.69 kg/(m^2).    ECOG FS:1 - Symptomatic but completely ambulatory  Sclerae unicteric, EOMs intact Oropharynx clear, no thrush or other lesions No cervical or supraclavicular adenopathy Lungs no rales or rhonchi Heart regular rate and rhythm Abd soft, obese, nontender, positive bowel sounds MSK no focal spinal tenderness, no upper extremity lymphedema Neuro: nonfocal, well oriented, appropriate affect Breasts: The right breast is status post lumpectomy and radiation. There is hyperpigmentation and some skin thickening. There is a recent biopsy area with a stitch in place.. There is no evidence of local recurrence. The right axilla is benign. Left breast is status post mastectomy. It is also status post radiation. There is no evidence of local recurrence There are no palpable findings in the axilla    LAB RESULTS:  CMP     Component Value Date/Time   NA 137 06/19/2014 1027   NA 138 02/13/2014 2308   K 5.0 06/19/2014 1027   K 3.5* 02/13/2014 2308   CL 105 02/13/2014 2308   CO2 22 06/19/2014 1027   CO2 22 02/13/2014 2308   GLUCOSE 137 06/19/2014 1027   GLUCOSE 99 02/13/2014 2308   BUN 39.3* 06/19/2014 1027   BUN 10 02/13/2014 2308   CREATININE 1.8* 06/19/2014 1027   CREATININE 0.70 02/13/2014 2308   CALCIUM 9.7 06/19/2014 1027   CALCIUM 9.0 02/13/2014 2308   PROT 7.0 06/19/2014 1027   PROT 6.3 02/13/2014 2308    ALBUMIN 4.0 06/19/2014 1027   ALBUMIN 3.4* 02/13/2014 2308   AST 15 06/19/2014 1027   AST 14 02/13/2014 2308   ALT 12 06/19/2014 1027   ALT 8 02/13/2014 2308   ALKPHOS 114 06/19/2014 1027   ALKPHOS 92 02/13/2014 2308   BILITOT 0.28 06/19/2014 1027   BILITOT 0.3 02/13/2014 2308   GFRNONAA >90 02/13/2014 2308   GFRAA >90 02/13/2014 2308    I No results found for: SPEP  Lab Results  Component Value Date   WBC 4.9 06/19/2014   NEUTROABS 3.4 06/19/2014   HGB 10.6* 06/19/2014   HCT 32.7* 06/19/2014   MCV 94.8 06/19/2014   PLT 294 06/19/2014      Chemistry      Component Value Date/Time   NA 137 06/19/2014 1027   NA 138 02/13/2014 2308   K 5.0 06/19/2014 1027   K 3.5* 02/13/2014 2308   CL 105 02/13/2014 2308   CO2 22 06/19/2014 1027   CO2 22 02/13/2014 2308   BUN 39.3* 06/19/2014 1027   BUN 10 02/13/2014 2308   CREATININE 1.8* 06/19/2014 1027   CREATININE 0.70 02/13/2014 2308      Component Value Date/Time   CALCIUM 9.7 06/19/2014 1027   CALCIUM 9.0 02/13/2014 2308   ALKPHOS 114 06/19/2014 1027   ALKPHOS 92 02/13/2014 2308   AST 15 06/19/2014 1027   AST 14 02/13/2014 2308   ALT 12 06/19/2014 1027   ALT 8 02/13/2014 2308   BILITOT 0.28 06/19/2014 1027   BILITOT 0.3 02/13/2014 2308       No results found for: LABCA2  No components found for: LABCA125  No results for input(s): INR in the last 168 hours.  Urinalysis    Component Value Date/Time   COLORURINE YELLOW 11/11/2013 1054   APPEARANCEUR CLOUDY* 11/11/2013 1054   LABSPEC 1.027 11/11/2013 1054   PHURINE 5.5 11/11/2013 1054   GLUCOSEU >1000* 11/11/2013 1054   HGBUR TRACE* 11/11/2013 1054   BILIRUBINUR SMALL* 11/11/2013 1054   KETONESUR 15* 11/11/2013 1054   PROTEINUR >300* 11/11/2013 1054   UROBILINOGEN 1.0 11/11/2013 1054   NITRITE NEGATIVE 11/11/2013 1054   LEUKOCYTESUR SMALL* 11/11/2013 1054    STUDIES: Mm Digital Diagnostic Unilat R  06/02/2014   CLINICAL DATA:  Personal history of  bilateral breast cancer 2014. The patient had mastectomy of the left breast. The patient had a lumpectomy of the right breast with radiation therapy. The patient presented with new diffuse skin redness and warmth in October of 2015 and was placed on antibiotics. This is a follow-up from that exam. The patient states the skin redness and warmth has not resolved in the interval.  EXAM: DIGITAL DIAGNOSTIC  RIGHT MAMMOGRAM WITH CAD  COMPARISON:  May 15, 2014, April 03, 2013, January 14, 2013  ACR Breast Density Category b: There are scattered areas of fibroglandular density.  FINDINGS: Cc and MLO views of the right breast are submitted. There is diffuse skin thickening of right breast unchanged compared to previous mammogram of October 2015.  Mammographic images were processed with CAD.  IMPRESSION: Suspicious findings.  RECOMMENDATION: Surgical consultation/skin punch biopsy to exclude inflammatory right breast cancer.  I have discussed the findings and recommendations with the patient. Results were also provided in writing at the conclusion of the visit. If applicable, a reminder letter will be sent to the patient regarding the next appointment.  BI-RADS CATEGORY  4: Suspicious abnormality - biopsy should be considered.   Electronically Signed   By: Abelardo Diesel M.D.   On: 06/02/2014 10:54   Patient: Kathy Maxwell, Kathy Maxwell Collected: 06/17/2014 Client: [facility] Accession: [id number] Received: 06/17/2014 Edsel Petrin. Dalbert Batman, MD DOB: 02-22-1956 Age: 71 Gender: F Reported: 06/19/2014 [address] [place] Patient Ph: MRN #: [place], [place] [id number] Client Acc#: Chart #: [id number] Phone: Fax: CC: REPORT OF SURGICAL PATHOLOGY FINAL DIAGNOSIS Diagnosis Breast, biopsy, 8 o'clock - BENIGN SKIN, SEE COMMENT. - NEGATIVE FOR ATYPIA OR MALIGNANCY. Microscopic Comment The history of mammary carcinoma is noted ([id number]). In the current case, multiple level sections demonstrate benign skin  with minimal, nonspecific dermal and subcutaneous chronic inflammation. There are no features of atypia or malignancy present. (CRR:ds 06/19/14) Mali RUND DO Pathologist, Electronic Signature   ASSESSMENT: 58 y.o. BRCA negative [place] woman  LEFT BREAST (1) status post left breast upper outer quadrant and left axillary lymph node biopsy 03/07/2013, both positive for an invasive ductal carcinoma, grade 1 or 2, estrogen receptor and progesterone receptor both strongly positive, HER-2 negative, with an MIB-1 between 13% and 15%  (2) biopsy of a more anterior mass in the left breast 04/03/2013 showed invasive ductal carcinoma, grade 2, estrogen and progesterone receptor positive, HER-2 negative, with an MIB-1 of 33%  (3) status post left modified radical mastectomy 04/22/2013 for mpT3 pN2, stage IIIA invasive ductal carcinoma, grade 1, repeat HER-2 again negative; 4 of 22 lymph nodes positive; ample margins   RIGHT BREAST (4) status post right central breast biopsy 04/03/2013 for ductal carcinoma in situ, 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% positive, progesterone receptor 11% positive   (5) right lumpectomy and sentinel lymph node sampling 04/22/2013 showed ductal carcinoma in situ, 3 cm, margins less than 1 mm medially  (a) skin  biopsy right breast 06/17/2014 showed no evidence of malignancy.  ADJUVANT CHEMOTHERAPY (6) cyclophosphamide, epirubicin, and fluorouracil given every 14 days x6 cycles completed 08/02/2013  (7) weekly paclitaxel x2, discontinued 08/23/2013, with poor tolerance   (8) Abraxane given day 1 day 8 of each 21 day cycle, with some delays, for a total 8 doses, completed 11/23/2023  ADJUVANT RADIATION (9) adjuvant radiation therapy completed 03/19/2014  ANTI-ESTROGEN THERAPY (10) started anastrozole 04/16/2014; dexa scan 04/03/2014 showed mild osteopenia (T - 1.4)  GENETIC TESTING (11) a broad genetic panel sent December 2014 showed a variant of uncertain significance, 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  There were no deleterious mutations noted in the other genes tested, which included ATM, BARD1, BRCA1, BRCA2, BRIP1, CDH1, CHEK2, EPCAM, FANCC, MLH1, MSH2, MSH6, NBN, PALB2, PMS2, PTEN, RAD51C, RAD51D, STK11, TP53, and XRCC2.    PLAN: I was delighted that Gales biopsy was negative. This is very reassuring. On the other hand she feels very weak, dizzy, and like she might pass out. She is on several blood pressure medications for her previously uncontrolled blood pressure. I think possibly these need to be moved back a little bit. I have asked her to cut her hydralazine down from 50 mg 3 times daily to 25 mg 3 times daily. If that resolves her symptoms then she will not need to make any other changes. Otherwise she will contact Dr. Baird Cancer office, and in any case she will go by Dr. Lynder Parents office to continue to have her blood pressure monitored.  As far as her breast cancer is concerned she will see [name] again in 3 months. She will continue on anastrozole to complete 5 years. She was encouraged to continue her vitamin D supplementation and to increase her weight-bearing exercise (primarily walking)  Avrielle has a good understanding of the overall plan. She agrees with it. She knows the goal of treatment in her case is cure. She will call with any problems that may develop before her next visit here. Chauncey Cruel, MD   06/19/2014 11:22 AM

## 2014-06-19 NOTE — Progress Notes (Signed)
Quick Note:  Inform patient of Pathology report,.tell her that the skin biopsy is benign. There is no cancer.  hmi ______

## 2014-06-19 NOTE — Patient Instructions (Signed)
Dehydration, Adult Dehydration is when you lose more fluids from the body than you take in. Vital organs like the kidneys, brain, and heart cannot function without a proper amount of fluids and salt. Any loss of fluids from the body can cause dehydration.  CAUSES   Vomiting.  Diarrhea.  Excessive sweating.  Excessive urine output.  Fever. SYMPTOMS  Mild dehydration  Thirst.  Dry lips.  Slightly dry mouth. Moderate dehydration  Very dry mouth.  Sunken eyes.  Skin does not bounce back quickly when lightly pinched and released.  Dark urine and decreased urine production.  Decreased tear production.  Headache. Severe dehydration  Very dry mouth.  Extreme thirst.  Rapid, weak pulse (more than 100 beats per minute at rest).  Cold hands and feet.  Not able to sweat in spite of heat and temperature.  Rapid breathing.  Blue lips.  Confusion and lethargy.  Difficulty being awakened.  Minimal urine production.  No tears. DIAGNOSIS  Your caregiver will diagnose dehydration based on your symptoms and your exam. Blood and urine tests will help confirm the diagnosis. The diagnostic evaluation should also identify the cause of dehydration. TREATMENT  Treatment of mild or moderate dehydration can often be done at home by increasing the amount of fluids that you drink. It is best to drink small amounts of fluid more often. Drinking too much at one time can make vomiting worse. Refer to the home care instructions below. Severe dehydration needs to be treated at the hospital where you will probably be given intravenous (IV) fluids that contain water and electrolytes. HOME CARE INSTRUCTIONS   Ask your caregiver about specific rehydration instructions.  Drink enough fluids to keep your urine clear or pale yellow.  Drink small amounts frequently if you have nausea and vomiting.  Eat as you normally do.  Avoid:  Foods or drinks high in sugar.  Carbonated  drinks.  Juice.  Extremely hot or cold fluids.  Drinks with caffeine.  Fatty, greasy foods.  Alcohol.  Tobacco.  Overeating.  Gelatin desserts.  Wash your hands well to avoid spreading bacteria and viruses.  Only take over-the-counter or prescription medicines for pain, discomfort, or fever as directed by your caregiver.  Ask your caregiver if you should continue all prescribed and over-the-counter medicines.  Keep all follow-up appointments with your caregiver. SEEK MEDICAL CARE IF:  You have abdominal pain and it increases or stays in one area (localizes).  You have a rash, stiff neck, or severe headache.  You are irritable, sleepy, or difficult to awaken.  You are weak, dizzy, or extremely thirsty. SEEK IMMEDIATE MEDICAL CARE IF:   You are unable to keep fluids down or you get worse despite treatment.  You have frequent episodes of vomiting or diarrhea.  You have blood or green matter (bile) in your vomit.  You have blood in your stool or your stool looks black and tarry.  You have not urinated in 6 to 8 hours, or you have only urinated a small amount of very dark urine.  You have a fever.  You faint. MAKE SURE YOU:   Understand these instructions.  Will watch your condition.  Will get help right away if you are not doing well or get worse. Document Released: 07/04/2005 Document Revised: 09/26/2011 Document Reviewed: 02/21/2011 ExitCare Patient Information 2015 [employer]. This information is not intended to replace advice given to you by your health care provider. Make sure you discuss any questions you have with your health care   provider.  

## 2014-06-20 NOTE — Progress Notes (Signed)
Patient called. Documented in allscripts

## 2014-06-20 NOTE — Progress Notes (Signed)
Kathy Maxwell has completed

## 2014-06-23 NOTE — Addendum Note (Signed)
Addended by: Laureen Abrahams on: 06/23/2014 06:02 PM   Modules accepted: Orders, Medications

## 2014-06-26 ENCOUNTER — Ambulatory Visit (HOSPITAL_BASED_OUTPATIENT_CLINIC_OR_DEPARTMENT_OTHER): Payer: Self-pay | Admitting: Oncology

## 2014-06-26 VITALS — BP 138/50 | HR 87

## 2014-06-26 DIAGNOSIS — Z136 Encounter for screening for cardiovascular disorders: Secondary | ICD-10-CM

## 2014-06-26 DIAGNOSIS — C50412 Malignant neoplasm of upper-outer quadrant of left female breast: Secondary | ICD-10-CM

## 2014-06-26 NOTE — Progress Notes (Signed)
Pt came in to office- reports Dr. Jana Hakim instructed her to come in to have BP checked since he adjusted her meds. BP 138/50, written for pt to take to PCP. She agreed to contact primary MD.

## 2014-06-29 NOTE — Progress Notes (Signed)
Kathy Maxwell returns today just for a blood pressure check. It was 138/50. This is fine. She will follow-up further with her primary care physician.

## 2014-07-04 ENCOUNTER — Other Ambulatory Visit: Payer: Self-pay | Admitting: Oncology

## 2014-07-05 ENCOUNTER — Other Ambulatory Visit (INDEPENDENT_AMBULATORY_CARE_PROVIDER_SITE_OTHER): Payer: Self-pay | Admitting: General Surgery

## 2014-08-13 ENCOUNTER — Telehealth: Payer: Self-pay | Admitting: Nurse Practitioner

## 2014-08-13 NOTE — Telephone Encounter (Signed)
pt cld to get appt time & date-gave pt time & date-pt understood

## 2014-09-08 ENCOUNTER — Encounter (HOSPITAL_COMMUNITY): Payer: Self-pay | Admitting: Emergency Medicine

## 2014-09-08 ENCOUNTER — Emergency Department (HOSPITAL_COMMUNITY)
Admission: EM | Admit: 2014-09-08 | Discharge: 2014-09-08 | Disposition: A | Discharge status: Home / Self Care | Payer: PPO | Attending: Emergency Medicine | Admitting: Emergency Medicine

## 2014-09-08 ENCOUNTER — Emergency Department (HOSPITAL_COMMUNITY): Payer: PPO

## 2014-09-08 DIAGNOSIS — Z91040 Latex allergy status: Secondary | ICD-10-CM | POA: Insufficient documentation

## 2014-09-08 DIAGNOSIS — Z79899 Other long term (current) drug therapy: Secondary | ICD-10-CM | POA: Diagnosis not present

## 2014-09-08 DIAGNOSIS — Z792 Long term (current) use of antibiotics: Secondary | ICD-10-CM | POA: Insufficient documentation

## 2014-09-08 DIAGNOSIS — K219 Gastro-esophageal reflux disease without esophagitis: Secondary | ICD-10-CM | POA: Diagnosis not present

## 2014-09-08 DIAGNOSIS — E119 Type 2 diabetes mellitus without complications: Secondary | ICD-10-CM | POA: Insufficient documentation

## 2014-09-08 DIAGNOSIS — Z9861 Coronary angioplasty status: Secondary | ICD-10-CM | POA: Insufficient documentation

## 2014-09-08 DIAGNOSIS — I2510 Atherosclerotic heart disease of native coronary artery without angina pectoris: Secondary | ICD-10-CM | POA: Diagnosis not present

## 2014-09-08 DIAGNOSIS — E785 Hyperlipidemia, unspecified: Secondary | ICD-10-CM | POA: Diagnosis not present

## 2014-09-08 DIAGNOSIS — Z859 Personal history of malignant neoplasm, unspecified: Secondary | ICD-10-CM | POA: Insufficient documentation

## 2014-09-08 DIAGNOSIS — Z7951 Long term (current) use of inhaled steroids: Secondary | ICD-10-CM | POA: Insufficient documentation

## 2014-09-08 DIAGNOSIS — Z791 Long term (current) use of non-steroidal anti-inflammatories (NSAID): Secondary | ICD-10-CM | POA: Insufficient documentation

## 2014-09-08 DIAGNOSIS — F319 Bipolar disorder, unspecified: Secondary | ICD-10-CM | POA: Diagnosis not present

## 2014-09-08 DIAGNOSIS — Z8669 Personal history of other diseases of the nervous system and sense organs: Secondary | ICD-10-CM | POA: Diagnosis not present

## 2014-09-08 DIAGNOSIS — Z9889 Other specified postprocedural states: Secondary | ICD-10-CM | POA: Insufficient documentation

## 2014-09-08 DIAGNOSIS — R2232 Localized swelling, mass and lump, left upper limb: Secondary | ICD-10-CM

## 2014-09-08 DIAGNOSIS — Z923 Personal history of irradiation: Secondary | ICD-10-CM | POA: Diagnosis not present

## 2014-09-08 DIAGNOSIS — E669 Obesity, unspecified: Secondary | ICD-10-CM | POA: Insufficient documentation

## 2014-09-08 DIAGNOSIS — Z853 Personal history of malignant neoplasm of breast: Secondary | ICD-10-CM | POA: Insufficient documentation

## 2014-09-08 DIAGNOSIS — Z87891 Personal history of nicotine dependence: Secondary | ICD-10-CM | POA: Insufficient documentation

## 2014-09-08 DIAGNOSIS — R011 Cardiac murmur, unspecified: Secondary | ICD-10-CM | POA: Diagnosis not present

## 2014-09-08 DIAGNOSIS — I10 Essential (primary) hypertension: Secondary | ICD-10-CM | POA: Diagnosis not present

## 2014-09-08 MED ORDER — ACETAMINOPHEN 325 MG PO TABS
650.0000 mg | ORAL_TABLET | Freq: Once | ORAL | Status: AC
  Administered 2014-09-08: 650 mg via ORAL
  Filled 2014-09-08: qty 2

## 2014-09-08 NOTE — ED Provider Notes (Signed)
CSN: [id number]     Arrival date & time 09/08/14  1047 History   First MD Initiated Contact with Patient 09/08/14 1206     Chief Complaint  Patient presents with  . Elbow Mass      (Consider location/radiation/quality/duration/timing/severity/associated sxs/prior Treatment) HPI  Kathy Maxwell is a 59 y.o. female with PMH of left breast invasive cancer no longer going radiation or chemotherapy treatment, hypertension, hyperlipidemia, bipolar, diabetes, obesity presenting with soft elbow mass on the left. Patient states it started yesterday and is painful especially with range of motion. Patient denies any injury. No fevers, chills, nausea, vomiting. She endorses some tingling but no numbness or weakness. Patient denies any soft masses elsewhere. No current joint swelling.   Past Medical History  Diagnosis Date  . Hyperlipidemia   . Hypertension   . Bipolar 1 disorder   . Cancer   . Hypertension 04/04/2013  . Hyperlipidemia 04/04/2013  . Bipolar 1 disorder 04/04/2013  . Breast cancer 03/10/13    left breast invasive ca  . Allergy     latex  . Obesity   . Diabetes mellitus without complication     NIDDM  . Sleep apnea     no cpap due to insurance  . Heart murmur   . GERD (gastroesophageal reflux disease)   . Coronary artery disease   . Radiation 01/30/14-03/19/14   Past Surgical History  Procedure Laterality Date  . Cholecystectomy    . Breast biopsy Left 05/08/09    fibroadenoma with microcalcifications,no malignancy id  . Breast biopsy Left 03/07/13    invasive ca,1 lymph node metastatic  . 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 Abdominal hysterectomy    . Coronary stent placement  2001  . Cardiac catheterization    . Eye surgery  6/13,9/14    laser,cataract lft  . Coronary angioplasty      LAD stent 2003  . Portacath placement Right 04/22/2013    Procedure: INSERTION PORT-A-CATH;  Surgeon: Adin Hector, MD;   Location: [place];  Service: General;  Laterality: Right;  . Mastectomy modified radical Left 04/22/2013    Procedure: MASTECTOMY MODIFIED RADICAL;  Surgeon: Adin Hector, MD;  Location: [place];  Service: General;  Laterality: Left;  . Breast lumpectomy with needle localization and axillary sentinel lymph node bx Right 04/22/2013    Procedure: BREAST LUMPECTOMY WITH NEEDLE LOCALIZATION AND AXILLARY SENTINEL LYMPH NODE BX;  Surgeon: Adin Hector, MD;  Location: [place];  Service: General;  Laterality: Right;  . Esophagogastroduodenoscopy N/A 08/07/2013    Procedure: ESOPHAGOGASTRODUODENOSCOPY (EGD);  Surgeon: Missy Sabins, MD;  Location: [facility];  Service: Endoscopy;  Laterality: N/A;  . Esophagogastroduodenoscopy N/A 11/13/2013    Procedure: ESOPHAGOGASTRODUODENOSCOPY (EGD);  Surgeon: Wonda Horner, MD;  Location: [facility];  Service: Endoscopy;  Laterality: N/A;   Family History  Problem Relation Age of Onset  . Heart disease Mother   . Breast cancer Maternal Aunt     dx in her 6s  . Heart disease Cousin 70    maternal cousin  . Heart attack Sister 74  . Cancer Paternal Aunt     NOS  . Cancer Maternal Aunt     NOS  . Lung cancer Cousin 1    maternal cousin - non smoker  . Brain cancer Cousin 13    maternal cousin's son  . Lung cancer Cousin 1    maternal cousin's son  . Breast cancer  Cousin     paternal cousin   History  Substance Use Topics  . Smoking status: Former Smoker -- 1.00 packs/day for 15 years    Types: Cigarettes    Quit date: 07/19/1987  . Smokeless tobacco: Never Used  . Alcohol Use: No   OB History    No data available     Review of Systems  Constitutional: Negative for fever and chills.  Respiratory: Negative for cough and shortness of breath.   Cardiovascular: Negative for chest pain and palpitations.  Gastrointestinal: Negative for nausea and vomiting.  Skin: Negative for color change and wound.  Neurological: Negative for weakness,  numbness and headaches.      Allergies  Cephalexin; Adhesive; and Latex  Home Medications   Prior to Admission medications   Medication Sig Start Date End Date Taking? Authorizing Provider  amLODipine (NORVASC) 10 MG tablet Take 10 mg by mouth daily.   Yes Historical Provider, MD  anastrozole (ARIMIDEX) 1 MG tablet Take 1 tablet (1 mg total) by mouth daily. 05/02/14  Yes Chauncey Cruel, MD  b complex vitamins tablet Take 1 tablet by mouth daily.   Yes Historical Provider, MD  buPROPion (WELLBUTRIN SR) 150 MG 12 hr tablet Take 1 tablet (150 mg total) by mouth daily. 08/30/13  Yes [name], MD  Cholecalciferol (VITAMIN D PO) Take 1 tablet by mouth daily.   Yes Historical Provider, MD  fluticasone (FLONASE) 50 MCG/ACT nasal spray Place 1 spray into both nostrils daily. 11/01/13  Yes Minette Headland, NP  gabapentin (NEURONTIN) 100 MG capsule Take 3 capsules (300 mg total) by mouth 3 (three) times daily. 09/13/13  Yes Minette Headland, NP  hydrALAZINE (APRESOLINE) 50 MG tablet Take 50 mg by mouth 2 (two) times daily.   Yes Historical Provider, MD  losartan (COZAAR) 100 MG tablet Take 1 tablet (100 mg total) by mouth daily. 02/19/14  Yes Chauncey Cruel, MD  metoprolol succinate (TOPROL-XL) 25 MG 24 hr tablet Take 25 mg by mouth daily.    Yes Historical Provider, MD  naproxen sodium (ANAPROX) 220 MG tablet Take 220 mg by mouth 2 (two) times daily with a meal.   Yes Historical Provider, MD  pantoprazole (PROTONIX) 40 MG tablet Take 1 tablet (40 mg total) by mouth daily. 08/09/13  Yes Robbie Lis, MD  pioglitazone-metformin (ACTOPLUS MET) [phone] MG per tablet Take 1 tablet by mouth every morning.    Yes Historical Provider, MD  rosuvastatin (CRESTOR) 5 MG tablet Take 5 mg by mouth daily.   Yes Historical Provider, MD  sitaGLIPtin (JANUVIA) 100 MG tablet Take 100 mg by mouth daily.   Yes Historical Provider, MD  doxycycline (VIBRA-TABS) 100 MG tablet Take 1 tablet (100 mg total) by  mouth 2 (two) times daily. Patient not taking: Reported on 09/08/2014 05/14/14   Drue Second, NP  fluconazole (DIFLUCAN) 150 MG tablet Take 1 tablet (150 mg total) by mouth once. Patient not taking: Reported on 09/08/2014 05/14/14   Drue Second, NP  hydrALAZINE (APRESOLINE) 25 MG tablet Take 25 mg by mouth 3 (three) times daily.     Historical Provider, MD  HYDROcodone-acetaminophen (NORCO/VICODIN) 5-325 MG per tablet Take 1-2 tablets by mouth every 6 (six) hours as needed for moderate pain. Patient not taking: Reported on 09/08/2014 05/14/14   Drue Second, NP  lidocaine-prilocaine (EMLA) cream Apply topically as needed. Apply to port-a-cath one - two hours before accessing. Patient not taking: Reported on 09/08/2014 08/30/13   Bernell List  Tami Lin, MD  LORazepam (ATIVAN) 0.5 MG tablet Take 1 tablet (0.22m) every 6 hours as needed for nausea or vomiting. Patient not taking: Reported on 09/08/2014 08/30/13   KDeatra Robinson MD  ondansetron (ZOFRAN) 4 MG tablet Take 1 tablet (4 mg total) by mouth every 6 (six) hours. Patient not taking: Reported on 09/08/2014 02/14/14   MTanna Furry MD   BP 125/53 mmHg  Pulse 66  Temp(Src) 98.1 F (36.7 C) (Oral)  Resp 16  SpO2 95% Physical Exam  Constitutional: She appears well-developed and well-nourished. No distress.  HENT:  Head: Normocephalic and atraumatic.  Eyes: Conjunctivae and EOM are normal. Right eye exhibits no discharge. Left eye exhibits no discharge.  Cardiovascular: Normal rate and regular rhythm.   2+. Pulses equal bilaterally. Less than 3 seconds cap refill.  Pulmonary/Chest: Effort normal and breath sounds normal. No respiratory distress. She has no wheezes.  Abdominal: Soft. Bowel sounds are normal. She exhibits no distension. There is no tenderness.  Musculoskeletal:  Soft mass to the left elbow without significant tenderness, erythema. Patient with full range of motion with tenderness and full extension. No tenderness with supination or  pronation.   Neurological: She is alert. She exhibits normal muscle tone. Coordination normal.  Patient with intact strength in bilateral upper extremities. Sensation intact.  Skin: Skin is warm and dry. She is not diaphoretic.  Nursing note and vitals reviewed.   ED Course  Procedures (including critical care time) Labs Review Labs Reviewed - No data to display  Imaging Review Dg Elbow Complete Left  09/08/2014   CLINICAL DATA:  59year old female with posterior left elbow palpable mass detected yesterday. Pain and tenderness at this site, including with flexion. No known injury. Initial encounter.  EXAM: LEFT ELBOW - COMPLETE 3+ VIEW  COMPARISON:  None.  FINDINGS: No definite joint effusion. Bone mineralization is within normal limits for age. Joint spaces and alignment are within normal limits. The radial head appears intact. The olecranon appears intact and within normal limits. No acute osseous abnormality identified.  Overlying the olecranon there is rounded area of soft tissue swelling and increased soft tissue density. No subcutaneous gas or retained radiopaque foreign body identified.  IMPRESSION: Posterior elbow soft tissue swelling with no underlying osseous abnormality identified.   Electronically Signed   By: HGenevie AnnM.D.   On: 09/08/2014 13:10     EKG Interpretation None      MDM   Final diagnoses:  Elbow mass, left   Patient with history of left-sided breast cancer presenting to ED with left elbow mass. She denies any fevers, nausea, vomiting. Mass is tender and patient with full range of motion. No erythema or warmth to the area. I doubt septic arthritis. X-ray without any osseous involvement. VSS. Lesion likely lipoma and could possibly be bursitis. Patient to follow-up with primary care provider as well as either general surgery for possible removal of mass or orthopedics for further pain control.   Discussed return precautions with patient. Discussed all results and  patient verbalizes understanding and agrees with plan.  Case has been discussed with Dr. LCanary Brimwho agrees with the above plan and to discharge.    VPura Spice PA-C 09/08/14 [place] MD 09/08/14 [phone]

## 2014-09-08 NOTE — Discharge Instructions (Signed)
Return to the emergency room with worsening of symptoms, new symptoms or with symptoms that are concerning, especially fevers, numbness, tingling, weakness, increased swelling, redness, pain, red streaks from the area.  RICE: Rest, Ice (three cycles of 20 mins on, 77mins off at least twice a day), compression/brace, elevation. Heating pad works well for back pain. Tylenol for discomfort. Follow up with PCP/orthopedist if symptoms worsen or are persistent. Read below information and follow recommendations.  Lipoma A lipoma is a noncancerous (benign) tumor composed of fat cells. They are usually found under the skin (subcutaneous). A lipoma may occur in any tissue of the body that contains fat. Common areas for lipomas to appear include the back, shoulders, buttocks, and thighs. Lipomas are a very common soft tissue growth. They are soft and grow slowly. Most problems caused by a lipoma depend on where it is growing. DIAGNOSIS  A lipoma can be diagnosed with a physical exam. These tumors rarely become cancerous, but radiographic studies can help determine this for certain. Studies used may include:  Computerized X-ray scans (CT or CAT scan).  Computerized magnetic scans (MRI). TREATMENT  Small lipomas that are not causing problems may be watched. If a lipoma continues to enlarge or causes problems, removal is often the best treatment. Lipomas can also be removed to improve appearance. Surgery is done to remove the fatty cells and the surrounding capsule. Most often, this is done with medicine that numbs the area (local anesthetic). The removed tissue is examined under a microscope to make sure it is not cancerous. Keep all follow-up appointments with your caregiver. SEEK MEDICAL CARE IF:   The lipoma becomes larger or hard.  The lipoma becomes painful, red, or increasingly swollen. These could be signs of infection or a more serious condition. Document Released: 06/24/2002 Document Revised:  09/26/2011 Document Reviewed: 12/04/2009 [facility] Patient Information 2015 [place], [place]. This information is not intended to replace advice given to you by your health care provider. Make sure you discuss any questions you have with your health care provider.  Bursitis Bursitis is a swelling and soreness (inflammation) of a fluid-filled sac (bursa) that overlies and protects a joint. It can be caused by injury, overuse of the joint, arthritis or infection. The joints most likely to be affected are the elbows, shoulders, hips and knees. HOME CARE INSTRUCTIONS   Apply ice to the affected area for 15-20 minutes each hour while awake for 2 days. Put the ice in a plastic bag and place a towel between the bag of ice and your skin.  Rest the injured joint as much as possible, but continue to put the joint through a full range of motion, 4 times per day. (The shoulder joint especially becomes rapidly "frozen" if not used.) When the pain lessens, begin normal slow movements and usual activities.  Only take over-the-counter or prescription medicines for pain, discomfort or fever as directed by your caregiver.  Your caregiver may recommend draining the bursa and injecting medicine into the bursa. This may help the healing process.  Follow all instructions for follow-up with your caregiver. This includes any orthopedic referrals, physical therapy and rehabilitation. Any delay in obtaining necessary care could result in a delay or failure of the bursitis to heal and chronic pain. SEEK IMMEDIATE MEDICAL CARE IF:   Your pain increases even during treatment.  You develop an oral temperature above 102 F (38.9 C) and have heat and inflammation over the involved bursa. MAKE SURE YOU:   Understand these instructions.  Will watch your condition.  Will get help right away if you are not doing well or get worse. Document Released: 07/01/2000 Document Revised: 09/26/2011 Document Reviewed:  09/23/2013 [facility] Patient Information 2015 [place], [place]. This information is not intended to replace advice given to you by your health care provider. Make sure you discuss any questions you have with your health care provider.

## 2014-09-08 NOTE — ED Notes (Signed)
Pt with Hx of breast cancer reports to ED for soft mass to left elbow. Pt states mass is painful and decreases active ROM. Pt denies recent chemotherapy or radiation treatments.

## 2014-09-10 ENCOUNTER — Other Ambulatory Visit: Payer: Self-pay | Admitting: *Deleted

## 2014-09-10 DIAGNOSIS — C50412 Malignant neoplasm of upper-outer quadrant of left female breast: Secondary | ICD-10-CM

## 2014-09-11 ENCOUNTER — Other Ambulatory Visit (HOSPITAL_BASED_OUTPATIENT_CLINIC_OR_DEPARTMENT_OTHER): Payer: PPO

## 2014-09-11 DIAGNOSIS — C50412 Malignant neoplasm of upper-outer quadrant of left female breast: Secondary | ICD-10-CM

## 2014-09-11 DIAGNOSIS — C50812 Malignant neoplasm of overlapping sites of left female breast: Secondary | ICD-10-CM

## 2014-09-11 LAB — COMPREHENSIVE METABOLIC PANEL (CC13)
ALK PHOS: 116 U/L (ref 40–150)
ALT: 7 U/L (ref 0–55)
AST: 13 U/L (ref 5–34)
Albumin: 3.9 g/dL (ref 3.5–5.0)
Anion Gap: 8 mEq/L (ref 3–11)
BUN: 22.4 mg/dL (ref 7.0–26.0)
CO2: 28 mEq/L (ref 22–29)
Calcium: 10 mg/dL (ref 8.4–10.4)
Chloride: 102 mEq/L (ref 98–109)
Creatinine: 1.1 mg/dL (ref 0.6–1.1)
EGFR: 61 mL/min/{1.73_m2} — AB (ref >90)
GLUCOSE: 157 mg/dL — AB (ref 70–140)
Potassium: 4 mEq/L (ref 3.5–5.1)
SODIUM: 138 meq/L (ref 136–145)
TOTAL PROTEIN: 7.1 g/dL (ref 6.4–8.3)
Total Bilirubin: 0.52 mg/dL (ref 0.20–1.20)

## 2014-09-11 LAB — CBC WITH DIFFERENTIAL/PLATELET
BASO%: 0.4 % (ref 0.0–2.0)
Basophils Absolute: 0 10*3/uL (ref 0.0–0.1)
EOS%: 2.4 % (ref 0.0–7.0)
Eosinophils Absolute: 0.1 10*3/uL (ref 0.0–0.5)
HEMATOCRIT: 36.4 % (ref 34.8–46.6)
HGB: 11.8 g/dL (ref 11.6–15.9)
LYMPH%: 22.6 % (ref 14.0–49.7)
MCH: 30.1 pg (ref 25.1–34.0)
MCHC: 32.4 g/dL (ref 31.5–36.0)
MCV: 92.9 fL (ref 79.5–101.0)
MONO#: 0.4 10*3/uL (ref 0.1–0.9)
MONO%: 9 % (ref 0.0–14.0)
NEUT#: 3.1 10*3/uL (ref 1.5–6.5)
NEUT%: 65.6 % (ref 38.4–76.8)
Platelets: 300 10*3/uL (ref 145–400)
RBC: 3.92 10*6/uL (ref 3.70–5.45)
RDW: 12.8 % (ref 11.2–14.5)
WBC: 4.7 10*3/uL (ref 3.9–10.3)
lymph#: 1.1 10*3/uL (ref 0.9–3.3)

## 2014-09-18 ENCOUNTER — Telehealth: Payer: Self-pay | Admitting: Oncology

## 2014-09-18 ENCOUNTER — Ambulatory Visit (HOSPITAL_BASED_OUTPATIENT_CLINIC_OR_DEPARTMENT_OTHER): Payer: PPO | Admitting: Nurse Practitioner

## 2014-09-18 ENCOUNTER — Other Ambulatory Visit: Payer: Self-pay | Admitting: *Deleted

## 2014-09-18 ENCOUNTER — Encounter: Payer: Self-pay | Admitting: Nurse Practitioner

## 2014-09-18 VITALS — BP 148/62 | HR 67 | Temp 98.1°F | Resp 20 | Wt 204.6 lb

## 2014-09-18 DIAGNOSIS — C50412 Malignant neoplasm of upper-outer quadrant of left female breast: Secondary | ICD-10-CM

## 2014-09-18 DIAGNOSIS — Z170 Estrogen receptor positive status [ER+]: Secondary | ICD-10-CM

## 2014-09-18 DIAGNOSIS — G629 Polyneuropathy, unspecified: Secondary | ICD-10-CM

## 2014-09-18 DIAGNOSIS — M899 Disorder of bone, unspecified: Secondary | ICD-10-CM

## 2014-09-18 DIAGNOSIS — D0511 Intraductal carcinoma in situ of right breast: Secondary | ICD-10-CM

## 2014-09-18 DIAGNOSIS — C773 Secondary and unspecified malignant neoplasm of axilla and upper limb lymph nodes: Secondary | ICD-10-CM

## 2014-09-18 MED ORDER — ANASTROZOLE 1 MG PO TABS: 1.0000 mg | ORAL_TABLET | Freq: Every day | ORAL | Status: DC

## 2014-09-18 MED ORDER — GABAPENTIN 100 MG PO CAPS: 300.0000 mg | ORAL_CAPSULE | Freq: Three times a day (TID) | ORAL | Status: DC

## 2014-09-18 NOTE — Progress Notes (Signed)
[place]  [phone]) [phone] [phone]) [phone]     ID: CHISOM MUNTEAN DOB: [birth year]  MR#: [id number]  JAS#:[id number]  Patient Care Team: Glendale Chard, MD as PCP - General (Internal Medicine) Fanny Skates, MD as Consulting Physician (General Surgery) Thea Silversmith, MD as Consulting Physician (Radiation Oncology)  CHIEF COMPLAINT: stage IIIA, estrogen receptor positive breast cancer CURRENT TREATMENT: anastrozole   BREAST CANCER HISTORY: From Dr. Dana Allan original intake note, 04/04/2013:  "Kathy Maxwell is a 59 y.o. female. With other medical problems who underwent a screening mammogram that led to a diagnostic mammogram and ultrasound of the left breast. The mammogram and ultrasound showed a 2.9 x 1.6 x 1.9 cm suspicious mass in the left breast at the 12:30 oh clock position 6 cm from the nipple. There was also a suspicious left axillary lymph node. She had image guided biopsy of the left breast mass and the lymph node. The pathology revealed an invasive mammary carcinoma probably ductal phenotype. Tumor was estrogen receptor +100% progesterone receptor +98% proliferation marker Ki-67 13% and HER-2/neu negative. Patient had MRI of the breasts performed on 03/15/2013 the left breast revealed a 7.4 cm area of what the lymph node. Right breast revealed and a 3.3 cm mass. Patient was seen by Dr. Fanny Skates for discussion of surgical options. He has referred the patient to medical oncology for discussion whether or not she will need a Port-A-Cath and chemotherapy."  The patient underwent left modified radical mastectomy 04/22/2014 showing multiple foci of low-grade invasive ductal carcinoma, the largest single lesion measuring 5.5 cm, and involving 4/22 lymph nodes. Repeat HER-2 study was again negative. 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 The patient proceeded to adjuvant chemotherapy completed May 2015 as summarized below. She is currently receiving radiation therapy.  The patient's subsequent history is detailed below.  INTERVAL HISTORY: Kathy Maxwell returns today for follow-up of her stage III breast cancer. She has been on anastrozole since September 2015 and is tolerating it well. She had mild hot flashes and joint pain before beginning this drug, and they are certainly no worse at this time. She denies vaginal changes. Last month she had left elbow pain and swelling. She had fluid drained from this site and now she has full range of motion back to this area. The interval history is otherwise generally unremarkable. She is going to start exercising at her new gym afforded to her through her insurance company.  REVIEW OF SYSTEMS: Kathy Maxwell denies fevers, chills, nausea, vomiting, or changes in bowel or bladder habits. Her appetite is good and her energy level is fair during the day. She has some issues sleeping at night, but usually reading will do the trick. She denies shortness, of breath, chest pain, cough, or palpitations. Since her last visit, her blood pressure has been under better control. On occasions she will still feel dizzy. She denies headaches or vision changes. She has no unexplained weight loss, bruising, bleeding, or night sweats. She takes gabapentin for her diabetic neuropathy. Her sugars have been under control. A detailed review of systems is otherwise stable.  PAST MEDICAL HISTORY: Past Medical History  Diagnosis Date  . Hyperlipidemia   . Hypertension   . Bipolar 1 disorder   .  Cancer   . Hypertension 04/04/2013  . Hyperlipidemia 04/04/2013  . Bipolar 1 disorder 04/04/2013  . Breast cancer 03/10/13    left breast invasive ca  . Allergy     latex  . Obesity   .  Diabetes mellitus without complication     NIDDM  . Sleep apnea     no cpap due to insurance  . Heart murmur   . GERD (gastroesophageal reflux disease)   . Coronary artery disease   . Radiation 01/30/14-03/19/14    PAST SURGICAL HISTORY: Past Surgical History  Procedure Laterality Date  . Cholecystectomy    . Breast biopsy Left 05/08/09    fibroadenoma with microcalcifications,no malignancy id  . Breast biopsy Left 03/07/13    invasive ca,1 lymph node metastatic  . 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 Abdominal hysterectomy    . Coronary stent placement  2001  . Cardiac catheterization    . Eye surgery  6/13,9/14    laser,cataract lft  . Coronary angioplasty      LAD stent 2003  . Portacath placement Right 04/22/2013    Procedure: INSERTION PORT-A-CATH;  Surgeon: Adin Hector, MD;  Location: [facility];  Service: General;  Laterality: Right;  . Mastectomy modified radical Left 04/22/2013    Procedure: MASTECTOMY MODIFIED RADICAL;  Surgeon: Adin Hector, MD;  Location: [facility];  Service: General;  Laterality: Left;  . Breast lumpectomy with needle localization and axillary sentinel lymph node bx Right 04/22/2013    Procedure: BREAST LUMPECTOMY WITH NEEDLE LOCALIZATION AND AXILLARY SENTINEL LYMPH NODE BX;  Surgeon: Adin Hector, MD;  Location: [facility];  Service: General;  Laterality: Right;  . Esophagogastroduodenoscopy N/A 08/07/2013    Procedure: ESOPHAGOGASTRODUODENOSCOPY (EGD);  Surgeon: Missy Sabins, MD;  Location: [facility];  Service: Endoscopy;  Laterality: N/A;  . Esophagogastroduodenoscopy N/A 11/13/2013    Procedure: ESOPHAGOGASTRODUODENOSCOPY (EGD);  Surgeon: Wonda Horner, MD;  Location: [facility] END[facility];  Service: Endoscopy;  Laterality: N/A;    FAMILY HISTORY Family History  Problem Relation Age of Onset  . Heart disease Mother   . Breast cancer Maternal Aunt     dx in her 77s  . Heart disease Cousin 67     maternal cousin  . Heart attack Sister 85  . Cancer Paternal Aunt     NOS  . Cancer Maternal Aunt     NOS  . Lung cancer Cousin 46    maternal cousin - non smoker  . Brain cancer Cousin 13    maternal cousin's son  . Lung cancer Cousin 10    maternal cousin's son  . Breast cancer Cousin     paternal cousin   the patient's father died from alcoholic cirrhosis at the age of 67. The patient's mother died from congestive heart failure the age of 62. The patient had no brothers, 4 sisters. One sister died from a myocardial infarction at the age of 55. The only other breast cancers in the family are a maternal aunt(Kathy Maxwell) with breast cancer diagnosed after the age of 20 and a second cousin on the mother's side, diagnosed with breast cancer approximately age 48. There is no history of ovarian cancer in the family. The patient has undergone genetic testing and his BRCA negative  GYNECOLOGIC HISTORY:  No LMP recorded. Patient is postmenopausal. Menarche age 51. The patient is GX P0. She underwent hysterectomy with bilateral salpingo-oophorectomy in 1997. She took hormone replacement briefly.  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 She is single. Currently she shares a home with a friend, but is looking for her own place. She attends a local church of [place]: Not in place. At her 02/12/2014 visit the patient was given the appropriate documents to complete and notarize at her discretion. She tells me she intends to name her goddaughter,, Gerlene Burdock, who is a CNA, as her healthcare power of attorney. Kathy Maxwell can be reached at [place]: History  Substance Use Topics  . Smoking status: Former Smoker -- 1.00 packs/day for 15 years    Types: Cigarettes    Quit date: 07/19/1987  . Smokeless tobacco: Never Used  . Alcohol Use: No     Colonoscopy: Never  PAP:  Bone density: Never  Lipid  panel:  Allergies  Allergen Reactions  . Cephalexin Other (See Comments)    Burned throat.  . Adhesive [Tape] Rash    Clear tape gloves ,elastic no problem  . Latex Hives and Rash    Current Outpatient Prescriptions  Medication Sig Dispense Refill  . amLODipine (NORVASC) 10 MG tablet Take 10 mg by mouth daily.    [name] anastrozole (ARIMIDEX) 1 MG tablet Take 1 tablet (1 mg total) by mouth daily. 90 tablet 4  . b complex vitamins tablet Take 1 tablet by mouth daily.    [name] buPROPion (WELLBUTRIN SR) 150 MG 12 hr tablet Take 1 tablet (150 mg total) by mouth daily. 30 tablet 6  . Cholecalciferol (VITAMIN D PO) Take 1 tablet by mouth daily.    [name] doxycycline (VIBRA-TABS) 100 MG tablet Take 1 tablet (100 mg total) by mouth 2 (two) times daily. 20 tablet 0  . gabapentin (NEURONTIN) 100 MG capsule Take 3 capsules (300 mg total) by mouth 3 (three) times daily. 870 capsule 6  . hydrALAZINE (APRESOLINE) 25 MG tablet Take 25 mg by mouth 3 (three) times daily.     . hydrALAZINE (APRESOLINE) 50 MG tablet Take 50 mg by mouth 2 (two) times daily.    [name] losartan (COZAAR) 100 MG tablet Take 1 tablet (100 mg total) by mouth daily. 90 tablet 0  . metoprolol succinate (TOPROL-XL) 25 MG 24 hr tablet Take 25 mg by mouth daily.     . naproxen sodium (ANAPROX) 220 MG tablet Take 220 mg by mouth 2 (two) times daily with a meal.    . pantoprazole (PROTONIX) 40 MG tablet Take 1 tablet (40 mg total) by mouth daily. 30 tablet 3  . pioglitazone-metformin (ACTOPLUS MET) 15-850 MG per tablet Take 1 tablet by mouth every morning.     . rosuvastatin (CRESTOR) 5 MG tablet Take 5 mg by mouth daily.    . sitaGLIPtin (JANUVIA) 100 MG tablet Take 100 mg by mouth daily.    . fluconazole (DIFLUCAN) 150 MG tablet Take 1 tablet (150 mg total) by mouth once. (Patient not taking: Reported on 09/08/2014) 1 tablet 1  . fluticasone (FLONASE) 50 MCG/ACT nasal spray Place 1 spray into both nostrils daily. (Patient not taking: Reported on  09/18/2014) 9.9 g 0  . HYDROcodone-acetaminophen (NORCO/VICODIN) 5-325 MG per tablet Take 1-2 tablets by mouth every 6 (six) hours as needed for moderate pain. (Patient not taking: Reported on 09/08/2014) 30 tablet 0  . lidocaine-prilocaine (EMLA) cream Apply topically as needed. Apply to port-a-cath one - two hours before accessing. (Patient not taking: Reported on 09/08/2014) 30  g 3  . LORazepam (ATIVAN) 0.5 MG tablet Take 1 tablet (0.$RemoveBef'5mg'tqMbNbGrTe$ ) every 6 hours as needed for nausea or vomiting. (Patient not taking: Reported on 09/08/2014) 30 tablet 2  . ondansetron (ZOFRAN) 4 MG tablet Take 1 tablet (4 mg total) by mouth every 6 (six) hours. (Patient not taking: Reported on 09/08/2014) 12 tablet 0   No current facility-administered medications for this visit.   Facility-Administered Medications Ordered in Other Visits  Medication Dose Route Frequency Provider Last Rate Last Dose  . sodium chloride 0.9 % injection 10 mL  10 mL Intravenous PRN [name], MD   10 mL at 05/24/13 1522    OBJECTIVE: Middle-aged woman who appears fatigued Filed Vitals:   09/18/14 1057  BP: 148/62  Pulse: 67  Temp: 98.1 F (36.7 C)  Resp: 20     Body mass index is 37.41 kg/(m^2).    ECOG FS:1 - Symptomatic but completely ambulatory  Skin: warm, dry  HEENT: sclerae anicteric, conjunctivae pink, oropharynx clear. No thrush or mucositis.  Lymph Nodes: No cervical or supraclavicular lymphadenopathy  Lungs: clear to auscultation bilaterally, no rales, wheezes, or rhonci  Heart: regular rate and rhythm  Abdomen: round, soft, non tender, positive bowel sounds  Musculoskeletal: No focal spinal tenderness, no peripheral edema  Neuro: non focal, well oriented, positive affect  Breasts: left breast status post mastectomy and radiation. No evidence of recurrent disease. Left axilla benign. Right breast status post lumpectomy and radiation. No palpable masses or suspicious lumps. Right axilla benign.   LAB RESULTS:  CMP      Component Value Date/Time   NA 138 09/11/2014 1011   NA 138 02/13/2014 2308   K 4.0 09/11/2014 1011   K 3.5* 02/13/2014 2308   CL 105 02/13/2014 2308   CO2 28 09/11/2014 1011   CO2 22 02/13/2014 2308   GLUCOSE 157* 09/11/2014 1011   GLUCOSE 99 02/13/2014 2308   BUN 22.4 09/11/2014 1011   BUN 10 02/13/2014 2308   CREATININE 1.1 09/11/2014 1011   CREATININE 0.70 02/13/2014 2308   CALCIUM 10.0 09/11/2014 1011   CALCIUM 9.0 02/13/2014 2308   PROT 7.1 09/11/2014 1011   PROT 6.3 02/13/2014 2308   ALBUMIN 3.9 09/11/2014 1011   ALBUMIN 3.4* 02/13/2014 2308   AST 13 09/11/2014 1011   AST 14 02/13/2014 2308   ALT 7 09/11/2014 1011   ALT 8 02/13/2014 2308   ALKPHOS 116 09/11/2014 1011   ALKPHOS 92 02/13/2014 2308   BILITOT 0.52 09/11/2014 1011   BILITOT 0.3 02/13/2014 2308   GFRNONAA >90 02/13/2014 2308   GFRAA >90 02/13/2014 2308    I No results found for: SPEP  Lab Results  Component Value Date   WBC 4.7 09/11/2014   NEUTROABS 3.1 09/11/2014   HGB 11.8 09/11/2014   HCT 36.4 09/11/2014   MCV 92.9 09/11/2014   PLT 300 09/11/2014      Chemistry      Component Value Date/Time   NA 138 09/11/2014 1011   NA 138 02/13/2014 2308   K 4.0 09/11/2014 1011   K 3.5* 02/13/2014 2308   CL 105 02/13/2014 2308   CO2 28 09/11/2014 1011   CO2 22 02/13/2014 2308   BUN 22.4 09/11/2014 1011   BUN 10 02/13/2014 2308   CREATININE 1.1 09/11/2014 1011   CREATININE 0.70 02/13/2014 2308      Component Value Date/Time   CALCIUM 10.0 09/11/2014 1011   CALCIUM 9.0 02/13/2014 2308   ALKPHOS 116 09/11/2014  1011   ALKPHOS 92 02/13/2014 2308   AST 13 09/11/2014 1011   AST 14 02/13/2014 2308   ALT 7 09/11/2014 1011   ALT 8 02/13/2014 2308   BILITOT 0.52 09/11/2014 1011   BILITOT 0.3 02/13/2014 2308       No results found for: LABCA2  No components found for: LABCA125  No results for input(s): INR in the last 168 hours.  Urinalysis    Component Value Date/Time   COLORURINE  YELLOW 11/11/2013 1054   APPEARANCEUR CLOUDY* 11/11/2013 1054   LABSPEC 1.027 11/11/2013 1054   PHURINE 5.5 11/11/2013 1054   GLUCOSEU >1000* 11/11/2013 1054   HGBUR TRACE* 11/11/2013 1054   BILIRUBINUR SMALL* 11/11/2013 1054   KETONESUR 15* 11/11/2013 1054   PROTEINUR >300* 11/11/2013 1054   UROBILINOGEN 1.0 11/11/2013 1054   NITRITE NEGATIVE 11/11/2013 1054   LEUKOCYTESUR SMALL* 11/11/2013 1054    STUDIES: Dg Elbow Complete Left  September 27, 2014   CLINICAL DATA:  60 year old female with posterior left elbow palpable mass detected yesterday. Pain and tenderness at this site, including with flexion. No known injury. Initial encounter.  EXAM: LEFT ELBOW - COMPLETE 3+ VIEW  COMPARISON:  None.  FINDINGS: No definite joint effusion. Bone mineralization is within normal limits for age. Joint spaces and alignment are within normal limits. The radial head appears intact. The olecranon appears intact and within normal limits. No acute osseous abnormality identified.  Overlying the olecranon there is rounded area of soft tissue swelling and increased soft tissue density. No subcutaneous gas or retained radiopaque foreign body identified.  IMPRESSION: Posterior elbow soft tissue swelling with no underlying osseous abnormality identified.   Electronically Signed   By: Genevie Ann M.D.   On: 09/27/2014 13:10   ASSESSMENT: 59 y.o. BRCA negative [place] woman  LEFT BREAST (1) status post left breast upper outer quadrant and left axillary lymph node biopsy 03/07/2013, both positive for an invasive ductal carcinoma, grade 1 or 2, estrogen receptor and progesterone receptor both strongly positive, HER-2 negative, with an MIB-1 between 13% and 15%  (2) biopsy of a more anterior mass in the left breast 04/03/2013 showed invasive ductal carcinoma, grade 2, estrogen and progesterone receptor positive, HER-2 negative, with an MIB-1 of 33%  (3) status post left modified radical mastectomy 04/22/2013 for mpT3 pN2, stage  IIIA invasive ductal carcinoma, grade 1, repeat HER-2 again negative; 4 of 22 lymph nodes positive; ample margins   RIGHT BREAST (4) status post right central breast biopsy 04/03/2013 for ductal carcinoma in situ, 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% positive, progesterone receptor 11% positive   (5) right lumpectomy and sentinel lymph node sampling 04/22/2013 showed ductal carcinoma in situ, 3 cm, margins less than 1 mm medially  (a) skin biopsy right breast 06/17/2014 showed no evidence of malignancy.  ADJUVANT CHEMOTHERAPY (6) cyclophosphamide, epirubicin, and fluorouracil given every 14 days x6 cycles completed 08/02/2013  (7) weekly paclitaxel x2, discontinued 08/23/2013, with poor tolerance   (8) Abraxane given day 1 day 8 of each 21 day cycle, with some delays, for a total 8 doses, completed 11/23/2023  ADJUVANT RADIATION (9) adjuvant radiation therapy completed 03/19/2014  ANTI-ESTROGEN THERAPY (10) started anastrozole 04/16/2014; dexa scan 04/03/2014 showed mild osteopenia (T - 1.4)  GENETIC TESTING (11) a broad genetic panel sent December 2014 showed a variant of uncertain significance, 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  There were no deleterious mutations noted in the other genes tested, which included ATM, BARD1, BRCA1, BRCA2, BRIP1, CDH1, CHEK2, EPCAM, FANCC, MLH1, MSH2, MSH6, NBN, PALB2, PMS2,  PTEN, RAD51C, RAD51D, STK11, TP53, and XRCC2.    PLAN: Kathy Maxwell looks and feels well today. The labs were reviewed in detail and were entirely stable. She is tolerating the anastrozole well and the goal is to continue this drug for a total of 5 years of antiestrogen therapy.   Kathy Maxwell will continue vitamin D supplementation and weight bearing exercise to combat her osteopenia.   She will return in 6 months for labs and a follow up visit. She understands and agrees with this plan. She knows the goal of treatment in her case is cure. She has been encouraged to call with any issues that might arise before her next visit  here.   Kathy Duster, NP   09/18/2014 11:27 AM

## 2014-09-18 NOTE — Addendum Note (Signed)
Addended by: Marcelino Duster on: 09/18/2014 04:41 PM   Modules accepted: Orders

## 2014-09-18 NOTE — Telephone Encounter (Signed)
Appts made and avs printed for pt Kathy Maxwell

## 2014-09-26 ENCOUNTER — Telehealth: Payer: Self-pay | Admitting: *Deleted

## 2014-09-26 ENCOUNTER — Other Ambulatory Visit: Payer: Self-pay | Admitting: Oncology

## 2014-09-26 NOTE — Telephone Encounter (Signed)
Patient called wanting a refill of losartan. Per prescription notes, medication should be refilled by primary care provider. RN instructed patient to call PCP for refill. Patient verbalized understanding.

## 2014-12-23 ENCOUNTER — Other Ambulatory Visit: Payer: Self-pay | Admitting: Family

## 2014-12-23 DIAGNOSIS — R51 Headache: Principal | ICD-10-CM

## 2014-12-23 DIAGNOSIS — R519 Headache, unspecified: Secondary | ICD-10-CM

## 2015-02-18 ENCOUNTER — Telehealth: Payer: Self-pay | Admitting: Oncology

## 2015-02-18 NOTE — Telephone Encounter (Signed)
Returned patients call to reschedule her appointment

## 2015-03-26 ENCOUNTER — Ambulatory Visit: Payer: PPO | Admitting: Oncology

## 2015-03-26 ENCOUNTER — Other Ambulatory Visit: Payer: PPO

## 2015-04-10 ENCOUNTER — Telehealth: Payer: Self-pay | Admitting: Oncology

## 2015-04-10 NOTE — Telephone Encounter (Signed)
Returned Advertising account executive. Patient out of town due to death in family. Moved appointment 09/28 to 10/05, confirmed

## 2015-04-13 ENCOUNTER — Telehealth: Payer: Self-pay | Admitting: Oncology

## 2015-04-13 NOTE — Telephone Encounter (Signed)
Patient was not accepting calls at the time. Mailed calendar of changed appointments per MD move to HB due to leaving half day 10/05.

## 2015-04-15 ENCOUNTER — Ambulatory Visit: Payer: PPO | Admitting: Oncology

## 2015-04-15 ENCOUNTER — Other Ambulatory Visit: Payer: PPO

## 2015-04-20 ENCOUNTER — Other Ambulatory Visit: Payer: Self-pay | Admitting: Oncology

## 2015-04-20 ENCOUNTER — Other Ambulatory Visit: Payer: Self-pay

## 2015-04-20 ENCOUNTER — Telehealth: Payer: Self-pay | Admitting: Nurse Practitioner

## 2015-04-20 DIAGNOSIS — Z853 Personal history of malignant neoplasm of breast: Secondary | ICD-10-CM

## 2015-04-20 NOTE — Telephone Encounter (Signed)
Patient called in and left a message to reschedule her 10/5 appointment to 10/14 which i have sone,she left [phone] to call and there was no answer or voicemail.  I tried the other phone as well and was no answer or vm   Kathy Maxwell

## 2015-04-22 ENCOUNTER — Other Ambulatory Visit: Payer: PPO

## 2015-04-22 ENCOUNTER — Ambulatory Visit: Payer: PPO | Admitting: Oncology

## 2015-04-22 ENCOUNTER — Ambulatory Visit: Payer: PPO | Admitting: Nurse Practitioner

## 2015-05-01 ENCOUNTER — Encounter: Payer: Self-pay | Admitting: Nurse Practitioner

## 2015-05-01 ENCOUNTER — Ambulatory Visit (HOSPITAL_BASED_OUTPATIENT_CLINIC_OR_DEPARTMENT_OTHER): Payer: PPO | Admitting: Nurse Practitioner

## 2015-05-01 ENCOUNTER — Other Ambulatory Visit (HOSPITAL_BASED_OUTPATIENT_CLINIC_OR_DEPARTMENT_OTHER): Payer: PPO

## 2015-05-01 ENCOUNTER — Telehealth: Payer: Self-pay | Admitting: Oncology

## 2015-05-01 VITALS — BP 185/67 | HR 83 | Temp 98.3°F | Resp 18 | Wt 215.4 lb

## 2015-05-01 DIAGNOSIS — Z23 Encounter for immunization: Secondary | ICD-10-CM | POA: Diagnosis not present

## 2015-05-01 DIAGNOSIS — E119 Type 2 diabetes mellitus without complications: Secondary | ICD-10-CM

## 2015-05-01 DIAGNOSIS — Z170 Estrogen receptor positive status [ER+]: Secondary | ICD-10-CM | POA: Diagnosis not present

## 2015-05-01 DIAGNOSIS — C50412 Malignant neoplasm of upper-outer quadrant of left female breast: Secondary | ICD-10-CM

## 2015-05-01 DIAGNOSIS — E1140 Type 2 diabetes mellitus with diabetic neuropathy, unspecified: Secondary | ICD-10-CM | POA: Insufficient documentation

## 2015-05-01 DIAGNOSIS — Z79811 Long term (current) use of aromatase inhibitors: Secondary | ICD-10-CM

## 2015-05-01 DIAGNOSIS — C773 Secondary and unspecified malignant neoplasm of axilla and upper limb lymph nodes: Secondary | ICD-10-CM

## 2015-05-01 DIAGNOSIS — N898 Other specified noninflammatory disorders of vagina: Secondary | ICD-10-CM

## 2015-05-01 LAB — COMPREHENSIVE METABOLIC PANEL (CC13)
ALBUMIN: 3.8 g/dL (ref 3.5–5.0)
ALK PHOS: 142 U/L (ref 40–150)
ALT: 13 U/L (ref 0–55)
ANION GAP: 9 meq/L (ref 3–11)
AST: 12 U/L (ref 5–34)
BUN: 15.5 mg/dL (ref 7.0–26.0)
CALCIUM: 9.8 mg/dL (ref 8.4–10.4)
CO2: 27 mEq/L (ref 22–29)
Chloride: 99 mEq/L (ref 98–109)
Creatinine: 1.1 mg/dL (ref 0.6–1.1)
EGFR: 63 mL/min/{1.73_m2} — ABNORMAL LOW (ref >90)
Glucose: 298 mg/dl — ABNORMAL HIGH (ref 70–140)
POTASSIUM: 4.2 meq/L (ref 3.5–5.1)
Sodium: 136 mEq/L (ref 136–145)
Total Bilirubin: <0.3 mg/dL (ref 0.20–1.20)
Total Protein: 7.5 g/dL (ref 6.4–8.3)

## 2015-05-01 LAB — CBC WITH DIFFERENTIAL/PLATELET
BASO%: 1.5 % (ref 0.0–2.0)
BASOS ABS: 0.1 10*3/uL (ref 0.0–0.1)
EOS%: 1.8 % (ref 0.0–7.0)
Eosinophils Absolute: 0.1 10*3/uL (ref 0.0–0.5)
HCT: 39.1 % (ref 34.8–46.6)
HEMOGLOBIN: 12.9 g/dL (ref 11.6–15.9)
LYMPH#: 1.6 10*3/uL (ref 0.9–3.3)
LYMPH%: 20.4 % (ref 14.0–49.7)
MCH: 29.6 pg (ref 25.1–34.0)
MCHC: 32.9 g/dL (ref 31.5–36.0)
MCV: 89.9 fL (ref 79.5–101.0)
MONO#: 0.6 10*3/uL (ref 0.1–0.9)
MONO%: 7 % (ref 0.0–14.0)
NEUT#: 5.6 10*3/uL (ref 1.5–6.5)
NEUT%: 69.3 % (ref 38.4–76.8)
Platelets: 297 10*3/uL (ref 145–400)
RBC: 4.35 10*6/uL (ref 3.70–5.45)
RDW: 13.3 % (ref 11.2–14.5)
WBC: 8.1 10*3/uL (ref 3.9–10.3)

## 2015-05-01 MED ORDER — INFLUENZA VAC SPLIT QUAD 0.5 ML IM SUSY
0.5000 mL | PREFILLED_SYRINGE | Freq: Once | INTRAMUSCULAR | Status: AC
  Administered 2015-05-01: 0.5 mL via INTRAMUSCULAR
  Filled 2015-05-01: qty 0.5

## 2015-05-01 MED ORDER — SODIUM CHLORIDE 0.9 % IJ SOLN
10.0000 mL | INTRAMUSCULAR | Status: DC | PRN
  Administered 2015-05-01: 10 mL via INTRAVENOUS
  Filled 2015-05-01: qty 10

## 2015-05-01 MED ORDER — HEPARIN SOD (PORK) LOCK FLUSH 100 UNIT/ML IV SOLN
500.0000 [IU] | Freq: Once | INTRAVENOUS | Status: AC
  Administered 2015-05-01: 500 [IU] via INTRAVENOUS
  Filled 2015-05-01: qty 5

## 2015-05-01 NOTE — Telephone Encounter (Signed)
Appointments made and avs printed for patient °

## 2015-05-01 NOTE — Progress Notes (Signed)
Kathy Maxwell  [phone] [phone]     ID: Kathy Maxwell DOB: [birth year]  MR#: [id number]  IOX#:[id number]  Patient Care Team: Glendale Chard, MD as PCP - General (Internal Medicine) Fanny Skates, MD as Consulting Physician (General Surgery) Thea Silversmith, MD as Consulting Physician (Radiation Oncology)  CHIEF COMPLAINT: stage IIIA, estrogen receptor positive breast cancer CURRENT TREATMENT: anastrozole   BREAST CANCER HISTORY: From Dr. Dana Allan original intake note, 04/04/2013:  "Kathy Maxwell is a 59 y.o. female. With other medical problems who underwent a screening mammogram that led to a diagnostic mammogram and ultrasound of the left breast. The mammogram and ultrasound showed a 2.9 x 1.6 x 1.9 cm suspicious mass in the left breast at the 12:30 oh clock position 6 cm from the nipple. There was also a suspicious left axillary lymph node. She had image guided biopsy of the left breast mass and the lymph node. The pathology revealed an invasive mammary carcinoma probably ductal phenotype. Tumor was estrogen receptor +100% progesterone receptor +98% proliferation marker Ki-67 13% and HER-2/neu negative. Patient had MRI of the breasts performed on 03/15/2013 the left breast revealed a 7.4 cm area of what the lymph node. Right breast revealed and a 3.3 cm mass. Patient was seen by Dr. Fanny Skates for discussion of surgical options. He has referred the patient to medical oncology for discussion whether or not she will need a Port-A-Cath and chemotherapy."  The patient underwent left modified radical mastectomy 04/22/2014 showing multiple foci of low-grade invasive ductal carcinoma, the largest single lesion measuring 5.5 cm, and involving 4/22 lymph nodes. Repeat HER-2 study was again negative. 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 The patient proceeded to adjuvant chemotherapy completed May 2015 as summarized below. She is currently receiving radiation therapy.  The patient's subsequent history is detailed below.  INTERVAL HISTORY: Kathy Maxwell returns today for follow-up of her stage III breast cancer. She has been on anastrozole since September 2015 and is tolerates it well with few complaints. Her hot flashes and mild joint pain are no worse on this medication. She does endorse some new vaginal dryness. The interval history remarkable for right cataract surgery. The procedure went well and her vision is improving.   REVIEW OF SYSTEMS: Omaya denies fevers, chills, nausea, vomiting, or changes in bowel or bladder habits. Her blood pressure is elevated today, but she believes it is related to coming to the cancer center today. She still walks occasionally for exercise. Her appetite is good. Her blood sugars run >200 on some occasions, but she faithfully checks it and takes her medications as prescribed. She continues on gabapentin and a B complex vitamin for diabetic neuropathy. She denies shortness of breath, chest pain, cough, or palpitations. She has no headaches, dizziness, or vision changes. A detailed review of systems is otherwise stable.  PAST MEDICAL HISTORY: Past Medical History  Diagnosis Date  . Hyperlipidemia   . Hypertension   . Bipolar 1 disorder   . Cancer   . Hypertension 04/04/2013  . Hyperlipidemia 04/04/2013  . Bipolar 1 disorder 04/04/2013  . Breast cancer 03/10/13    left breast invasive ca  . Allergy     latex  . Obesity   . Diabetes  mellitus without complication     NIDDM  . Sleep apnea     no cpap due to insurance  . Heart murmur   . GERD (gastroesophageal reflux disease)   . Coronary artery disease   . Radiation 01/30/14-03/19/14    PAST SURGICAL  HISTORY: Past Surgical History  Procedure Laterality Date  . Cholecystectomy    . Breast biopsy Left 05/08/09    fibroadenoma with microcalcifications,no malignancy id  . Breast biopsy Left 03/07/13    invasive ca,1 lymph node metastatic  . 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 Abdominal hysterectomy    . Coronary stent placement  2001  . Cardiac catheterization    . Eye surgery  6/13,9/14    laser,cataract lft  . Coronary angioplasty      LAD stent 2003  . Portacath placement Right 04/22/2013    Procedure: INSERTION PORT-A-CATH;  Surgeon: [name], MD;  Location: [facility] OR;  Service: General;  Laterality: Right;  . Mastectomy modified radical Left 04/22/2013    Procedure: MASTECTOMY MODIFIED RADICAL;  Surgeon: [name], MD;  Location: [facility] OR;  Service: General;  Laterality: Left;  . Breast lumpectomy with needle localization and axillary sentinel lymph node bx Right 04/22/2013    Procedure: BREAST LUMPECTOMY WITH NEEDLE LOCALIZATION AND AXILLARY SENTINEL LYMPH NODE BX;  Surgeon: [name], MD;  Location: [facility] OR;  Service: General;  Laterality: Right;  . Esophagogastroduodenoscopy N/A 08/07/2013    Procedure: ESOPHAGOGASTRODUODENOSCOPY (EGD);  Surgeon: Barrie Folk, MD;  Location: [facility] ENDOSCOPY;  Service: Endoscopy;  Laterality: N/A;  . Esophagogastroduodenoscopy N/A 11/13/2013    Procedure: ESOPHAGOGASTRODUODENOSCOPY (EGD);  Surgeon: Graylin Shiver, MD;  Location: [facility] ENDOSCOPY;  Service: Endoscopy;  Laterality: N/A;    FAMILY HISTORY Family History  Problem Relation Age of Onset  . Heart disease Mother   . Breast cancer Maternal Aunt     dx in her 67s  . Heart disease Cousin 21    maternal cousin  . Heart attack Sister 47  . Cancer Paternal Aunt     NOS  . Cancer Maternal Aunt     NOS  . Lung cancer Cousin 80    maternal cousin - non smoker  . Brain cancer Cousin 13    maternal cousin's son  . Lung cancer  Cousin 23    maternal cousin's son  . Breast cancer Cousin     paternal cousin   the patient's father died from alcoholic cirrhosis at the age of 58. The patient's mother died from congestive heart failure the age of 66. The patient had no brothers, 4 sisters. One sister died from a myocardial infarction at the age of 24. The only other breast cancers in the family are a maternal aunt(Otto 5) with breast cancer diagnosed after the age of 66 and a second cousin on the mother's side, diagnosed with breast cancer approximately age 18. There is no history of ovarian cancer in the family. The patient has undergone genetic testing and his BRCA negative  GYNECOLOGIC HISTORY:  No LMP recorded. Patient is postmenopausal. Menarche age 3. The patient is GX P0. She underwent hysterectomy with bilateral salpingo-oophorectomy in 1997. She took hormone replacement briefly.  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 She is single. Currently she shares a home with a friend, but is looking for her own place. She attends a [employer], research (medical) of [address]  ADVANCED DIRECTIVES: Not in place. At her 02/12/2014 visit the patient was given the appropriate documents to complete and notarize at her discretion. She tells me she intends to name her goddaughter,, Gerlene Burdock, who is a CNA, as her healthcare power of attorney. Kathy Maxwell can be reached at [place]: Social History  Substance Use Topics  . Smoking status: Former Smoker -- 1.00 packs/day for 15 years    Types: Cigarettes    Quit date: 07/19/1987  . Smokeless tobacco: Never Used  . Alcohol Use: No     Colonoscopy: Never  PAP:  Bone density: Never  Lipid panel:  Allergies  Allergen Reactions  . Cephalexin Other (See Comments)    Burned throat.  . Adhesive [Tape] Rash    Clear tape gloves ,elastic no problem  . Latex Hives and Rash    Current Outpatient Prescriptions  Medication  Sig Dispense Refill  . amLODipine (NORVASC) 10 MG tablet Take 10 mg by mouth daily.    [name] anastrozole (ARIMIDEX) 1 MG tablet Take 1 tablet (1 mg total) by mouth daily. 90 tablet 4  . b complex vitamins tablet Take 1 tablet by mouth daily.    [name] buPROPion (WELLBUTRIN SR) 150 MG 12 hr tablet Take 1 tablet (150 mg total) by mouth daily. 30 tablet 6  . Cholecalciferol (VITAMIN D PO) Take 1 tablet by mouth daily.    [name] gabapentin (NEURONTIN) 100 MG capsule Take 3 capsules (300 mg total) by mouth 3 (three) times daily. 270 capsule 6  . hydrALAZINE (APRESOLINE) 25 MG tablet Take 25 mg by mouth 3 (three) times daily.     [name] losartan (COZAAR) 100 MG tablet Take 1 tablet (100 mg total) by mouth daily. 90 tablet 0  . metoprolol succinate (TOPROL-XL) 25 MG 24 hr tablet Take 25 mg by mouth daily.     . naproxen sodium (ANAPROX) 220 MG tablet Take 220 mg by mouth 2 (two) times daily with a meal.    . pantoprazole (PROTONIX) 40 MG tablet Take 1 tablet (40 mg total) by mouth daily. 30 tablet 3  . pioglitazone-metformin (ACTOPLUS MET) 15-850 MG per tablet Take 1 tablet by mouth every morning.     . rosuvastatin (CRESTOR) 5 MG tablet Take 5 mg by mouth daily.    . sitaGLIPtin (JANUVIA) 100 MG tablet Take 100 mg by mouth daily.    . fluticasone (FLONASE) 50 MCG/ACT nasal spray Place 1 spray into both nostrils daily. (Patient not taking: Reported on 09/18/2014) 9.9 g 0  . HYDROcodone-acetaminophen (NORCO/VICODIN) 5-325 MG per tablet Take 1-2 tablets by mouth every 6 (six) hours as needed for moderate pain. (Patient not taking: Reported on 09/08/2014) 30 tablet 0  . lidocaine-prilocaine (EMLA) cream Apply topically as needed. Apply to port-a-cath one - two hours before accessing. (Patient not taking: Reported on 09/08/2014) 30 g 3   Current Facility-Administered Medications  Medication Dose Route Frequency Provider Last Rate Last Dose  . Influenza vac split quadrivalent PF (FLUARIX) injection 0.5 mL  0.5 mL Intramuscular  Once Laurie Panda, NP      . sodium chloride 0.9 % injection 10 mL  10 mL Intravenous PRN Laurie Panda, NP   10 mL at 05/01/15 1438   Facility-Administered Medications Ordered in Other Visits  Medication Dose Route Frequency Provider Last Rate Last Dose  . sodium chloride 0.9 % injection 10 mL  10 mL Intravenous PRN Consuela Mimes, MD   10 mL at 05/24/13 1522  OBJECTIVE: Middle-aged woman who appears fatigued Filed Vitals:   05/01/15 1352  BP: 185/67  Pulse: 83  Temp: 98.3 F (36.8 C)  Resp: 18     Body mass index is 39.39 kg/(m^2).    ECOG FS:1 - Symptomatic but completely ambulatory  Skin: warm, dry  HEENT: sclerae anicteric, conjunctivae pink, oropharynx clear. No thrush or mucositis.  Lymph Nodes: No cervical or supraclavicular lymphadenopathy  Lungs: clear to auscultation bilaterally, no rales, wheezes, or rhonci  Heart: regular rate and rhythm  Abdomen: round, soft, non tender, positive bowel sounds  Musculoskeletal: No focal spinal tenderness, no peripheral edema  Neuro: non focal, well oriented, positive affect  Breasts: left breast status post lumpectomy and radiation. No evidence of recurrent disease. Left axilla benign. Right breast status post lumpectomy and radiation. No palpable masses or suspicious skin changes. Right axilla benign.   LAB RESULTS:  CMP     Component Value Date/Time   NA 136 05/01/2015 1332   NA 138 02/13/2014 2308   K 4.2 05/01/2015 1332   K 3.5* 02/13/2014 2308   CL 105 02/13/2014 2308   CO2 27 05/01/2015 1332   CO2 22 02/13/2014 2308   GLUCOSE 298* 05/01/2015 1332   GLUCOSE 99 02/13/2014 2308   BUN 15.5 05/01/2015 1332   BUN 10 02/13/2014 2308   CREATININE 1.1 05/01/2015 1332   CREATININE 0.70 02/13/2014 2308   CALCIUM 9.8 05/01/2015 1332   CALCIUM 9.0 02/13/2014 2308   PROT 7.5 05/01/2015 1332   PROT 6.3 02/13/2014 2308   ALBUMIN 3.8 05/01/2015 1332   ALBUMIN 3.4* 02/13/2014 2308   AST 12 05/01/2015 1332   AST 14  02/13/2014 2308   ALT 13 05/01/2015 1332   ALT 8 02/13/2014 2308   ALKPHOS 142 05/01/2015 1332   ALKPHOS 92 02/13/2014 2308   BILITOT <0.30 05/01/2015 1332   BILITOT 0.3 02/13/2014 2308   GFRNONAA >90 02/13/2014 2308   GFRAA >90 02/13/2014 2308    I No results found for: SPEP  Lab Results  Component Value Date   WBC 8.1 05/01/2015   NEUTROABS 5.6 05/01/2015   HGB 12.9 05/01/2015   HCT 39.1 05/01/2015   MCV 89.9 05/01/2015   PLT 297 05/01/2015      Chemistry      Component Value Date/Time   NA 136 05/01/2015 1332   NA 138 02/13/2014 2308   K 4.2 05/01/2015 1332   K 3.5* 02/13/2014 2308   CL 105 02/13/2014 2308   CO2 27 05/01/2015 1332   CO2 22 02/13/2014 2308   BUN 15.5 05/01/2015 1332   BUN 10 02/13/2014 2308   CREATININE 1.1 05/01/2015 1332   CREATININE 0.70 02/13/2014 2308      Component Value Date/Time   CALCIUM 9.8 05/01/2015 1332   CALCIUM 9.0 02/13/2014 2308   ALKPHOS 142 05/01/2015 1332   ALKPHOS 92 02/13/2014 2308   AST 12 05/01/2015 1332   AST 14 02/13/2014 2308   ALT 13 05/01/2015 1332   ALT 8 02/13/2014 2308   BILITOT <0.30 05/01/2015 1332   BILITOT 0.3 02/13/2014 2308       No results found for: LABCA2  No components found for: LABCA125  No results for input(s): INR in the last 168 hours.  Urinalysis    Component Value Date/Time   COLORURINE YELLOW 11/11/2013 1054   APPEARANCEUR CLOUDY* 11/11/2013 1054   LABSPEC 1.027 11/11/2013 1054   PHURINE 5.5 11/11/2013 1054   GLUCOSEU >1000* 11/11/2013 1054   HGBUR TRACE*  11/11/2013 1054   BILIRUBINUR SMALL* 11/11/2013 1054   KETONESUR 15* 11/11/2013 1054   PROTEINUR >300* 11/11/2013 1054   UROBILINOGEN 1.0 11/11/2013 1054   NITRITE NEGATIVE 11/11/2013 1054   LEUKOCYTESUR SMALL* 11/11/2013 1054    STUDIES: No results found.   ASSESSMENT: 59 y.o. BRCA negative [place] woman  LEFT BREAST (1) status post left breast upper outer quadrant and left axillary lymph node biopsy 03/07/2013,  both positive for an invasive ductal carcinoma, grade 1 or 2, estrogen receptor and progesterone receptor both strongly positive, HER-2 negative, with an MIB-1 between 13% and 15%  (2) biopsy of a more anterior mass in the left breast 04/03/2013 showed invasive ductal carcinoma, grade 2, estrogen and progesterone receptor positive, HER-2 negative, with an MIB-1 of 33%  (3) status post left modified radical mastectomy 04/22/2013 for mpT3 pN2, stage IIIA invasive ductal carcinoma, grade 1, repeat HER-2 again negative; 4 of 22 lymph nodes positive; ample margins   RIGHT BREAST (4) status post right central breast biopsy 04/03/2013 for ductal carcinoma in situ, 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% positive, progesterone receptor 11% positive   (5) right lumpectomy and sentinel lymph node sampling 04/22/2013 showed ductal carcinoma in situ, 3 cm, margins less than 1 mm medially  (a) skin biopsy right breast 06/17/2014 showed no evidence of malignancy.  ADJUVANT CHEMOTHERAPY (6) cyclophosphamide, epirubicin, and fluorouracil given every 14 days x6 cycles completed 08/02/2013  (7) weekly paclitaxel x2, discontinued 08/23/2013, with poor tolerance   (8) Abraxane given day 1 day 8 of each 21 day cycle, with some delays, for a total 8 doses, completed 11/23/2023  ADJUVANT RADIATION (9) adjuvant radiation therapy completed 03/19/2014  ANTI-ESTROGEN THERAPY (10) started anastrozole 04/16/2014; dexa scan 04/03/2014 showed mild osteopenia (T - 1.4)  GENETIC TESTING (11) a broad genetic panel sent December 2014 showed a variant of uncertain significance, 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  There were no deleterious mutations noted in the other genes tested, which included ATM, BARD1, BRCA1, BRCA2, BRIP1, CDH1, CHEK2, EPCAM, FANCC, MLH1, MSH2, MSH6, NBN, PALB2, PMS2, PTEN, RAD51C, RAD51D, STK11, TP53, and XRCC2.    PLAN: Kathy Maxwell is in great spirits today. The labs were reviewed in detail and were entirely stable, with the exception of an  elevated blood glucose. She will follow up with Dr. Baird Cancer this month or next to recheck her A1c. She is tolerating the anastrozole well and will continue this drug for a total of 5 years of antiestrogen therapy. She will try coconut oil for her vaginal dryness moving forward. She is already scheduled for a repeat right mammogram at the end of this month.  We are having Kathy Maxwell's port flush today. She intends to have it removed this year, and states she will call Dr. Dalbert Batman herself to schedule this.  Kathy Maxwell will return in 6 months for labs and a follow up visit. She understands and agrees with this plan. She knows the goal of treatment in her case is cure. She has been encouraged to call with any issues that might arise before her next visit here.   Laurie Panda, NP   05/01/2015 2:44 PM

## 2015-06-16 ENCOUNTER — Telehealth: Payer: Self-pay | Admitting: Oncology

## 2015-06-16 NOTE — Telephone Encounter (Signed)
pt cld to r/s appt-gave pt r/s time & date °

## 2015-07-02 ENCOUNTER — Ambulatory Visit (HOSPITAL_BASED_OUTPATIENT_CLINIC_OR_DEPARTMENT_OTHER): Payer: PPO

## 2015-07-02 ENCOUNTER — Ambulatory Visit: Payer: PPO

## 2015-07-02 VITALS — BP 147/50 | HR 110 | Temp 98.1°F

## 2015-07-02 DIAGNOSIS — Z452 Encounter for adjustment and management of vascular access device: Secondary | ICD-10-CM | POA: Diagnosis not present

## 2015-07-02 DIAGNOSIS — Z95828 Presence of other vascular implants and grafts: Secondary | ICD-10-CM

## 2015-07-02 DIAGNOSIS — C50412 Malignant neoplasm of upper-outer quadrant of left female breast: Secondary | ICD-10-CM

## 2015-07-02 DIAGNOSIS — C773 Secondary and unspecified malignant neoplasm of axilla and upper limb lymph nodes: Secondary | ICD-10-CM | POA: Diagnosis not present

## 2015-07-02 MED ORDER — HEPARIN SOD (PORK) LOCK FLUSH 100 UNIT/ML IV SOLN
500.0000 [IU] | Freq: Once | INTRAVENOUS | Status: AC
  Administered 2015-07-02: 500 [IU] via INTRAVENOUS
  Filled 2015-07-02: qty 5

## 2015-07-02 MED ORDER — SODIUM CHLORIDE 0.9 % IJ SOLN
10.0000 mL | INTRAMUSCULAR | Status: DC | PRN
  Administered 2015-07-02: 10 mL via INTRAVENOUS
  Filled 2015-07-02: qty 10

## 2015-07-27 IMAGING — CR DG CHEST 2V
2 series · 2 of 2 positions shown · non-contrast
Comparison: NM PET IMAGE INITIAL (PI) SKULL BASE TO THIGH dated
04/29/2013; DG CHEST 1V PORT dated 04/22/2013

CLINICAL DATA: Central chest pain. Difficulty accessing right-sided
chest port.

EXAM:
CHEST  2 VIEW

[w chest pa]
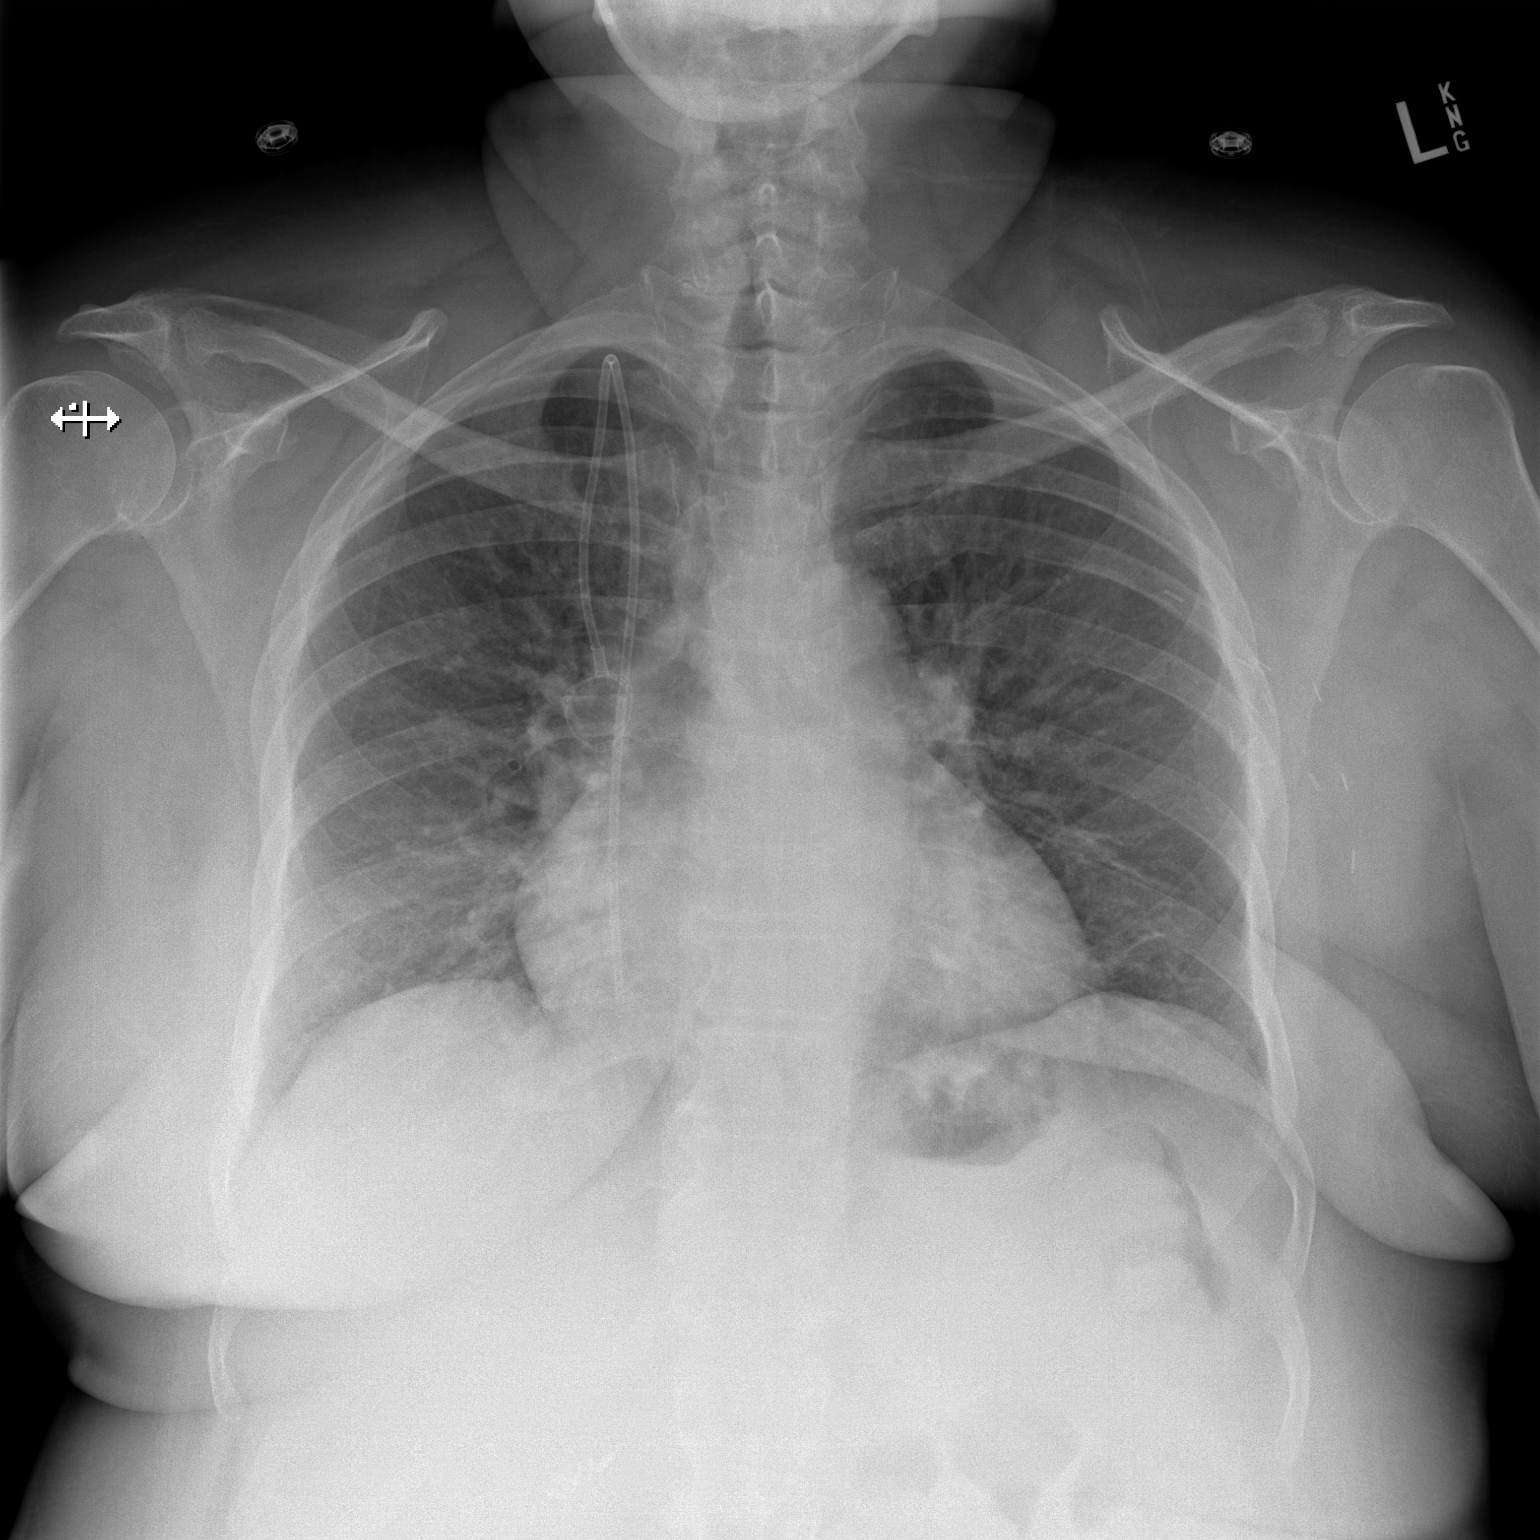

[w chest lat]
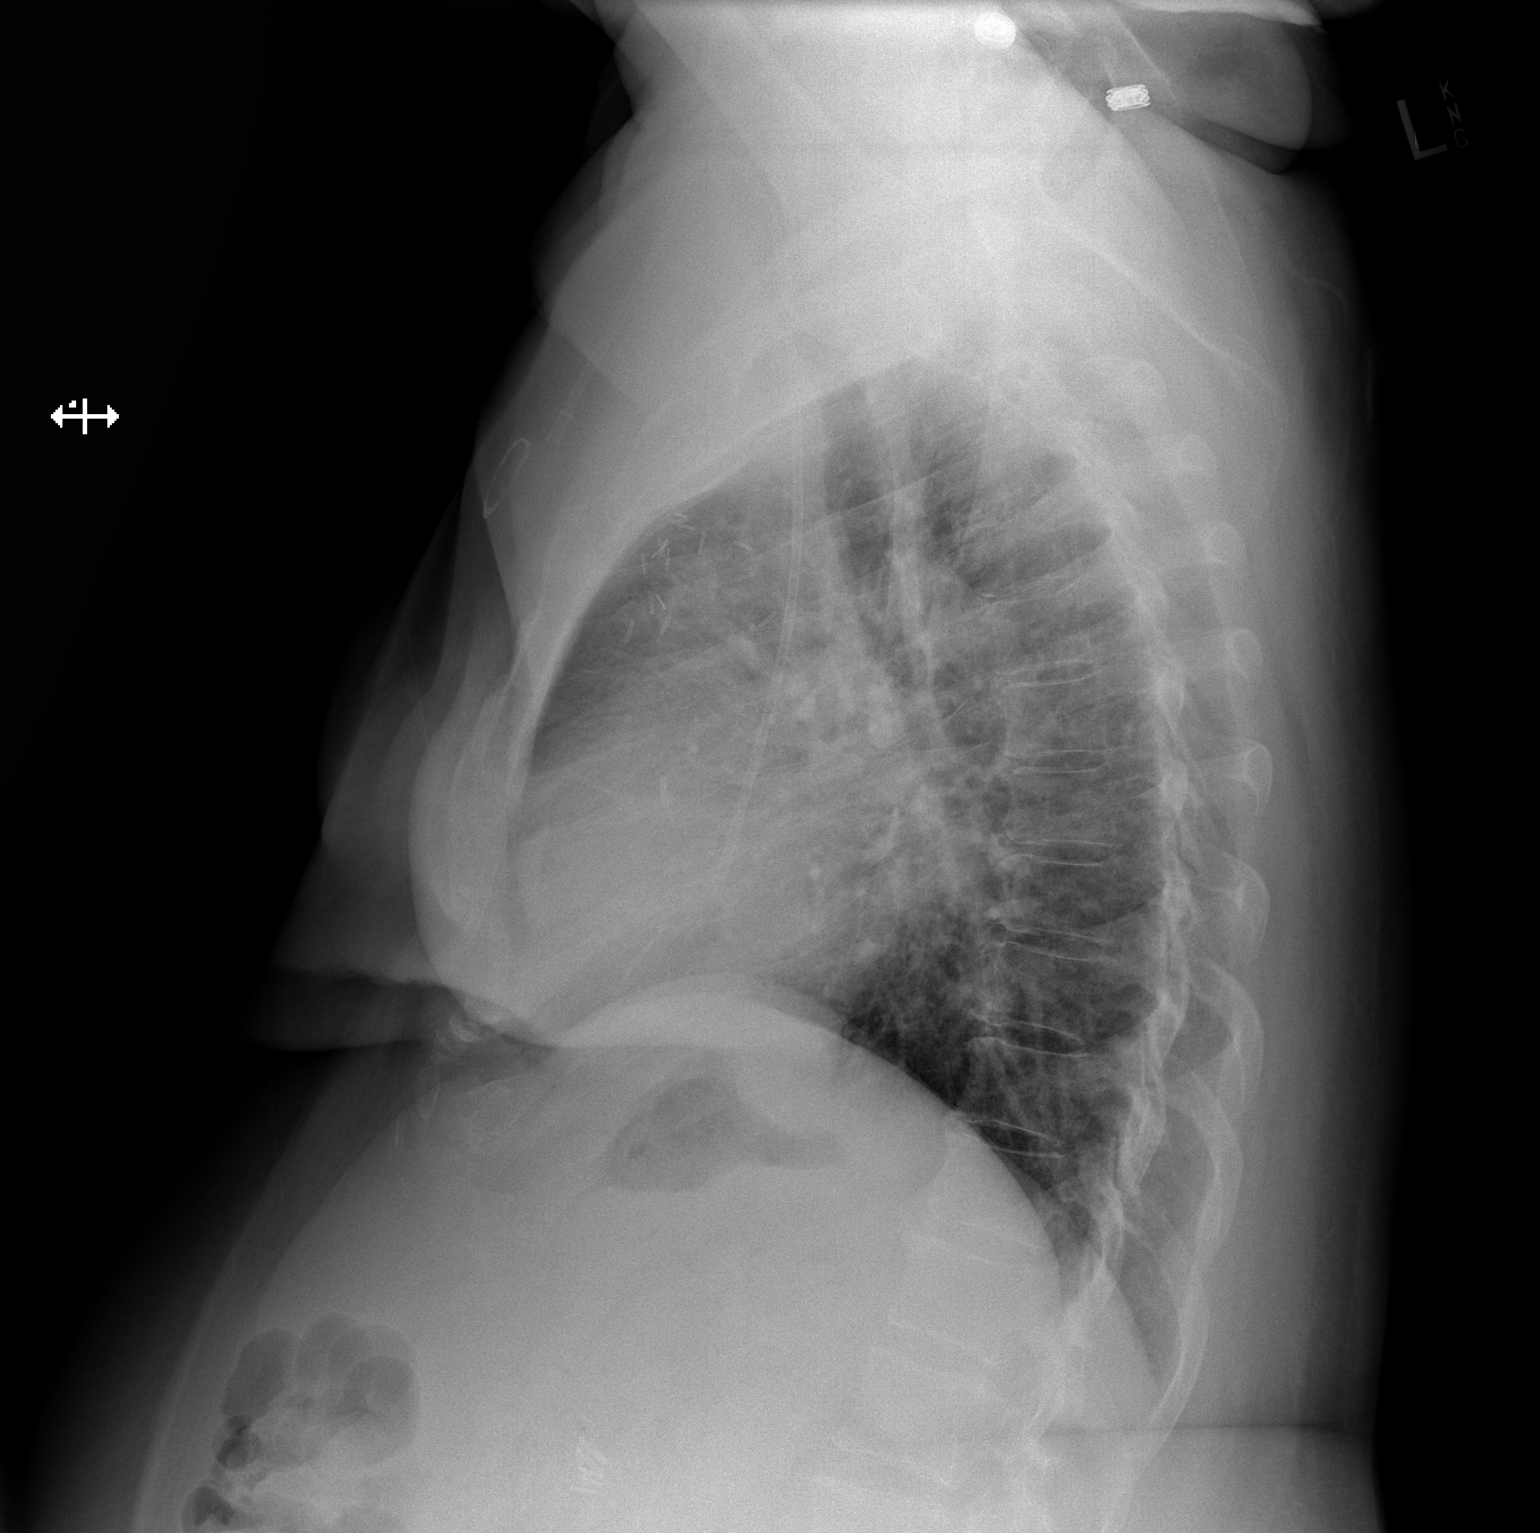

[2 of 2 positions shown; findings below may reference images not displayed]

FINDINGS: The lungs are well-aerated. Pulmonary vascularity is at the upper
limits of normal. Minimal bibasilar opacities likely reflect
atelectasis. There is no evidence of pleural effusion or
pneumothorax.

The heart is normal in size; the mediastinal contour is within
normal limits. No acute osseous abnormalities are seen. A
right-sided chest port is noted ending within the right atrium.
Clips are seen at the left axilla. Clips are noted within the right
upper quadrant, reflecting prior cholecystectomy.
IMPRESSION: 1. Right-sided chest port again seen ending within the right atrium.
2. Minimal bibasilar airspace opacities likely reflect atelectasis.

## 2015-07-29 MED FILL — ANASTROZOLE 1 MG TABLET: 1 | 90 days supply | Qty: 90 | Fill #0

## 2015-07-29 MED FILL — LOSARTAN POTASSIUM 100 MG T: 100 | 30 days supply | Qty: 30 | Fill #2

## 2015-07-29 MED FILL — GABAPENTIN 100 MG CAPSULE: 100 | 30 days supply | Qty: 270 | Fill #4

## 2015-07-30 ENCOUNTER — Ambulatory Visit
Admission: RE | Admit: 2015-07-30 | Discharge: 2015-07-30 | Disposition: A | Discharge status: Home / Self Care | Payer: PPO | Source: Ambulatory Visit | Attending: Oncology | Admitting: Oncology

## 2015-07-30 DIAGNOSIS — R922 Inconclusive mammogram: Secondary | ICD-10-CM | POA: Diagnosis not present

## 2015-07-30 DIAGNOSIS — I10 Essential (primary) hypertension: Secondary | ICD-10-CM | POA: Diagnosis not present

## 2015-07-30 DIAGNOSIS — E1165 Type 2 diabetes mellitus with hyperglycemia: Secondary | ICD-10-CM | POA: Diagnosis not present

## 2015-07-30 DIAGNOSIS — R05 Cough: Secondary | ICD-10-CM | POA: Diagnosis not present

## 2015-07-30 DIAGNOSIS — Z853 Personal history of malignant neoplasm of breast: Secondary | ICD-10-CM

## 2015-09-04 ENCOUNTER — Telehealth: Payer: Self-pay | Admitting: Oncology

## 2015-09-04 NOTE — Telephone Encounter (Signed)
Pt called to get flush appt added to her schedule. Appt made

## 2015-09-08 ENCOUNTER — Telehealth: Payer: Self-pay | Admitting: Oncology

## 2015-09-08 ENCOUNTER — Ambulatory Visit (HOSPITAL_BASED_OUTPATIENT_CLINIC_OR_DEPARTMENT_OTHER): Payer: PPO

## 2015-09-08 VITALS — BP 147/58 | HR 87 | Temp 98.8°F | Resp 18

## 2015-09-08 DIAGNOSIS — Z452 Encounter for adjustment and management of vascular access device: Secondary | ICD-10-CM | POA: Diagnosis not present

## 2015-09-08 DIAGNOSIS — C50412 Malignant neoplasm of upper-outer quadrant of left female breast: Secondary | ICD-10-CM | POA: Diagnosis not present

## 2015-09-08 DIAGNOSIS — C773 Secondary and unspecified malignant neoplasm of axilla and upper limb lymph nodes: Secondary | ICD-10-CM

## 2015-09-08 DIAGNOSIS — Z95828 Presence of other vascular implants and grafts: Secondary | ICD-10-CM

## 2015-09-08 MED ORDER — HEPARIN SOD (PORK) LOCK FLUSH 100 UNIT/ML IV SOLN
500.0000 [IU] | Freq: Once | INTRAVENOUS | Status: AC
  Administered 2015-09-08: 500 [IU] via INTRAVENOUS
  Filled 2015-09-08: qty 5

## 2015-09-08 MED ORDER — SODIUM CHLORIDE 0.9% FLUSH
10.0000 mL | INTRAVENOUS | Status: DC | PRN
  Administered 2015-09-08: 10 mL via INTRAVENOUS
  Filled 2015-09-08: qty 10

## 2015-09-08 MED FILL — METOPROLOL SUCC ER 25 MG TA: 25 | 30 days supply | Qty: 30 | Fill #5

## 2015-09-08 MED FILL — GABAPENTIN 100 MG CAPSULE: 100 | 30 days supply | Qty: 270 | Fill #5

## 2015-09-08 MED FILL — LOSARTAN POTASSIUM 100 MG T: 100 | 30 days supply | Qty: 30 | Fill #3

## 2015-09-08 NOTE — Telephone Encounter (Signed)
pt came in to get updated copy of sch-printed

## 2015-09-08 NOTE — Patient Instructions (Signed)

## 2015-09-10 DIAGNOSIS — Z853 Personal history of malignant neoplasm of breast: Secondary | ICD-10-CM | POA: Diagnosis not present

## 2015-09-19 DIAGNOSIS — E109 Type 1 diabetes mellitus without complications: Secondary | ICD-10-CM | POA: Diagnosis not present

## 2015-10-02 ENCOUNTER — Other Ambulatory Visit: Payer: Self-pay | Admitting: Nurse Practitioner

## 2015-10-02 DIAGNOSIS — C50412 Malignant neoplasm of upper-outer quadrant of left female breast: Secondary | ICD-10-CM | POA: Diagnosis not present

## 2015-10-08 ENCOUNTER — Encounter (HOSPITAL_COMMUNITY): Payer: Self-pay

## 2015-10-08 ENCOUNTER — Emergency Department (HOSPITAL_COMMUNITY)
Admission: EM | Admit: 2015-10-08 | Discharge: 2015-10-08 | Disposition: A | Discharge status: Home / Self Care | Payer: PPO | Attending: Emergency Medicine | Admitting: Emergency Medicine

## 2015-10-08 ENCOUNTER — Emergency Department (HOSPITAL_COMMUNITY): Payer: PPO

## 2015-10-08 DIAGNOSIS — F319 Bipolar disorder, unspecified: Secondary | ICD-10-CM | POA: Diagnosis not present

## 2015-10-08 DIAGNOSIS — Z9889 Other specified postprocedural states: Secondary | ICD-10-CM | POA: Diagnosis not present

## 2015-10-08 DIAGNOSIS — Z7952 Long term (current) use of systemic steroids: Secondary | ICD-10-CM | POA: Diagnosis not present

## 2015-10-08 DIAGNOSIS — N390 Urinary tract infection, site not specified: Secondary | ICD-10-CM | POA: Insufficient documentation

## 2015-10-08 DIAGNOSIS — I10 Essential (primary) hypertension: Secondary | ICD-10-CM | POA: Insufficient documentation

## 2015-10-08 DIAGNOSIS — Z7984 Long term (current) use of oral hypoglycemic drugs: Secondary | ICD-10-CM | POA: Insufficient documentation

## 2015-10-08 DIAGNOSIS — E669 Obesity, unspecified: Secondary | ICD-10-CM | POA: Diagnosis not present

## 2015-10-08 DIAGNOSIS — Z8719 Personal history of other diseases of the digestive system: Secondary | ICD-10-CM | POA: Insufficient documentation

## 2015-10-08 DIAGNOSIS — I2510 Atherosclerotic heart disease of native coronary artery without angina pectoris: Secondary | ICD-10-CM | POA: Insufficient documentation

## 2015-10-08 DIAGNOSIS — Z853 Personal history of malignant neoplasm of breast: Secondary | ICD-10-CM | POA: Insufficient documentation

## 2015-10-08 DIAGNOSIS — Z9861 Coronary angioplasty status: Secondary | ICD-10-CM | POA: Insufficient documentation

## 2015-10-08 DIAGNOSIS — Z87891 Personal history of nicotine dependence: Secondary | ICD-10-CM | POA: Diagnosis not present

## 2015-10-08 DIAGNOSIS — E119 Type 2 diabetes mellitus without complications: Secondary | ICD-10-CM | POA: Diagnosis not present

## 2015-10-08 DIAGNOSIS — B349 Viral infection, unspecified: Secondary | ICD-10-CM | POA: Diagnosis not present

## 2015-10-08 DIAGNOSIS — Z79899 Other long term (current) drug therapy: Secondary | ICD-10-CM | POA: Diagnosis not present

## 2015-10-08 DIAGNOSIS — R011 Cardiac murmur, unspecified: Secondary | ICD-10-CM | POA: Insufficient documentation

## 2015-10-08 DIAGNOSIS — R509 Fever, unspecified: Secondary | ICD-10-CM | POA: Diagnosis not present

## 2015-10-08 DIAGNOSIS — R05 Cough: Secondary | ICD-10-CM | POA: Diagnosis not present

## 2015-10-08 LAB — URINALYSIS, ROUTINE W REFLEX MICROSCOPIC
Bilirubin Urine: NEGATIVE
Glucose, UA: NEGATIVE mg/dL
Ketones, ur: NEGATIVE mg/dL
Nitrite: POSITIVE — AB
Protein, ur: 100 mg/dL — AB
SPECIFIC GRAVITY, URINE: 1.017 (ref 1.005–1.030)
pH: 6.5 (ref 5.0–8.0)

## 2015-10-08 LAB — CBC WITH DIFFERENTIAL/PLATELET
BASOS ABS: 0 10*3/uL (ref 0.0–0.1)
Basophils Relative: 0 %
EOS ABS: 0.1 10*3/uL (ref 0.0–0.7)
Eosinophils Relative: 1 %
HCT: 35 % — ABNORMAL LOW (ref 36.0–46.0)
HEMOGLOBIN: 11.7 g/dL — AB (ref 12.0–15.0)
Lymphocytes Relative: 10 %
Lymphs Abs: 0.8 10*3/uL (ref 0.7–4.0)
MCH: 29.8 pg (ref 26.0–34.0)
MCHC: 33.4 g/dL (ref 30.0–36.0)
MCV: 89.1 fL (ref 78.0–100.0)
Monocytes Absolute: 0.7 10*3/uL (ref 0.1–1.0)
Monocytes Relative: 9 %
NEUTROS PCT: 80 %
Neutro Abs: 5.9 10*3/uL (ref 1.7–7.7)
Platelets: 253 10*3/uL (ref 150–400)
RBC: 3.93 MIL/uL (ref 3.87–5.11)
RDW: 12.8 % (ref 11.5–15.5)
WBC: 7.4 10*3/uL (ref 4.0–10.5)

## 2015-10-08 LAB — URINE MICROSCOPIC-ADD ON

## 2015-10-08 LAB — BASIC METABOLIC PANEL
ANION GAP: 9 (ref 5–15)
BUN: 18 mg/dL (ref 6–20)
CHLORIDE: 100 mmol/L — AB (ref 101–111)
CO2: 25 mmol/L (ref 22–32)
Calcium: 9.3 mg/dL (ref 8.9–10.3)
Creatinine, Ser: 1.1 mg/dL — ABNORMAL HIGH (ref 0.44–1.00)
GFR calc Af Amer: >60 mL/min (ref >60)
GFR calc non Af Amer: 53 mL/min — ABNORMAL LOW (ref >60)
Glucose, Bld: 179 mg/dL — ABNORMAL HIGH (ref 65–99)
POTASSIUM: 4.5 mmol/L (ref 3.5–5.1)
SODIUM: 134 mmol/L — AB (ref 135–145)

## 2015-10-08 MED ORDER — SODIUM CHLORIDE 0.9 % IV BOLUS (SEPSIS)
1000.0000 mL | Freq: Once | INTRAVENOUS | Status: AC
  Administered 2015-10-08: 1000 mL via INTRAVENOUS

## 2015-10-08 MED ORDER — ONDANSETRON HCL 4 MG/2ML IJ SOLN
4.0000 mg | Freq: Once | INTRAMUSCULAR | Status: AC
  Administered 2015-10-08: 4 mg via INTRAVENOUS
  Filled 2015-10-08: qty 2

## 2015-10-08 MED ORDER — SULFAMETHOXAZOLE-TRIMETHOPRIM 800-160 MG PO TABS: 1.0000 | ORAL_TABLET | Freq: Two times a day (BID) | ORAL | Status: AC

## 2015-10-08 MED ORDER — ACETAMINOPHEN 325 MG PO TABS
650.0000 mg | ORAL_TABLET | Freq: Once | ORAL | Status: AC
  Administered 2015-10-08: 650 mg via ORAL
  Filled 2015-10-08: qty 2

## 2015-10-08 NOTE — ED Notes (Addendum)
Pt with fever/chills, body aches.  Headaches with dizziness starting Tuesday.  No n/v.  Decreased appetite.  Cough since Tuesday

## 2015-10-08 NOTE — ED Provider Notes (Signed)
CSN: [id number]     Arrival date & time 10/08/15  1709 History   First MD Initiated Contact with Patient 10/08/15 1928     Chief Complaint  Patient presents with  . Generalized Body Aches  . Fever   HPI  Kathy Maxwell is a 60 year old female with PMHx of HTN, bipolar disorder, stage 3 breast cancer and CAD presenting with fever and myalgias. Onset of symptoms was 2 days ago. She is complaining of fevers up to 102. She has not taken any medications for fever control. She complains of a dry cough. She states that she coughs so frequently that her chest is becoming sore. Denies chest soreness between coughing episodes. She also complains of sinus pressure between her eyes, nasal congestion and rhinorrhea. She denies purulent nasal discharge. She also endorses a generalized, throbbing headache. She has been having nausea without vomiting. She states that her nausea has decreased her appetite so she hasn't eaten much over the past 2 days. She also endorses fatigue, lightheadedness and generalized myalgias. She reports a God daughter at home who has viral upper respiratory symptoms as well. Denies associated vision changes, ear pain, eye discharge, sore throat, neck pain, neck stiffness, shortness of breath, wheezing, abdominal pain, vomiting, diarrhea, dysuria or vaginal discharge. She has not received chemotherapy or radiation in 2 years. She still follows with her oncologist for maintenance appointments most recently in October 2016. She is on antiestrogen therapy x 5 years.   Past Medical History  Diagnosis Date  . Hyperlipidemia   . Hypertension   . Bipolar 1 disorder ([place])   . Cancer ([place])   . Hypertension 04/04/2013  . Hyperlipidemia 04/04/2013  . Bipolar 1 disorder ([place]) 04/04/2013  . Breast cancer ([place]) 03/10/13    left breast invasive ca  . Allergy     latex  . Obesity   . Diabetes mellitus without complication ([facility])     NIDDM  . Sleep apnea     no cpap due to insurance  . Heart murmur    . GERD (gastroesophageal reflux disease)   . Coronary artery disease   . Radiation 01/30/14-03/19/14   Past Surgical History  Procedure Laterality Date  . Cholecystectomy    . Breast biopsy Left 05/08/09    fibroadenoma with microcalcifications,no malignancy id  . Breast biopsy Left 03/07/13    invasive ca,1 lymph node metastatic  . 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 Abdominal hysterectomy    . Coronary stent placement  2001  . Cardiac catheterization    . Eye surgery  6/13,9/14    laser,cataract lft  . Coronary angioplasty      LAD stent 2003  . Portacath placement Right 04/22/2013    Procedure: INSERTION PORT-A-CATH;  Surgeon: Adin Hector, MD;  Location: [place];  Service: General;  Laterality: Right;  . Mastectomy modified radical Left 04/22/2013    Procedure: MASTECTOMY MODIFIED RADICAL;  Surgeon: Adin Hector, MD;  Location: [facility];  Service: General;  Laterality: Left;  . Breast lumpectomy with needle localization and axillary sentinel lymph node bx Right 04/22/2013    Procedure: BREAST LUMPECTOMY WITH NEEDLE LOCALIZATION AND AXILLARY SENTINEL LYMPH NODE BX;  Surgeon: Adin Hector, MD;  Location: [place];  Service: General;  Laterality: Right;  . Esophagogastroduodenoscopy N/A 08/07/2013    Procedure: ESOPHAGOGASTRODUODENOSCOPY (EGD);  Surgeon: Missy Sabins, MD;  Location: [facility];  Service: Endoscopy;  Laterality: N/A;  . Esophagogastroduodenoscopy N/A  11/13/2013    Procedure: ESOPHAGOGASTRODUODENOSCOPY (EGD);  Surgeon: Wonda Horner, MD;  Location: [facility] ENDOSCOPY;  Service: Endoscopy;  Laterality: N/A;   Family History  Problem Relation Age of Onset  . Heart disease Mother   . Breast cancer Maternal Aunt     dx in her 52s  . Heart disease Cousin 44    maternal cousin  . Heart attack Sister 63  . Cancer Paternal Aunt     NOS  . Cancer Maternal Aunt     NOS  . Lung cancer Cousin 89    maternal cousin - non  smoker  . Brain cancer Cousin 13    maternal cousin's son  . Lung cancer Cousin 57    maternal cousin's son  . Breast cancer Cousin     paternal cousin   Social History  Substance Use Topics  . Smoking status: Former Smoker -- 1.00 packs/day for 15 years    Types: Cigarettes    Quit date: 07/19/1987  . Smokeless tobacco: Never Used  . Alcohol Use: No   OB History    No data available     Review of Systems  All other systems reviewed and are negative.     Allergies  Cephalexin; Adhesive; and Latex  Home Medications   Prior to Admission medications   Medication Sig Start Date End Date Taking? Authorizing Provider  amLODipine (NORVASC) 10 MG tablet Take 10 mg by mouth daily.   Yes Historical Provider, MD  anastrozole (ARIMIDEX) 1 MG tablet Take 1 tablet (1 mg total) by mouth daily. 09/18/14  Yes Laurie Panda, NP  b complex vitamins tablet Take 1 tablet by mouth daily.   Yes Historical Provider, MD  buPROPion (WELLBUTRIN SR) 150 MG 12 hr tablet Take 1 tablet (150 mg total) by mouth daily. 08/30/13  Yes Consuela Mimes, MD  Cholecalciferol (VITAMIN D PO) Take 1 tablet by mouth daily.   Yes Historical Provider, MD  fluticasone (FLONASE) 50 MCG/ACT nasal spray Place 1 spray into both nostrils daily. 11/01/13  Yes Minette Headland, NP  gabapentin (NEURONTIN) 100 MG capsule TAKE 3 CAPSULES BY MOUTH THREE TIMES DAILY 10/02/15  Yes Laurie Panda, NP  hydrALAZINE (APRESOLINE) 25 MG tablet Take 25 mg by mouth 3 (three) times daily.    Yes Historical Provider, MD  losartan (COZAAR) 100 MG tablet Take 1 tablet (100 mg total) by mouth daily. 02/19/14  Yes Chauncey Cruel, MD  metoprolol succinate (TOPROL-XL) 25 MG 24 hr tablet Take 25 mg by mouth daily.    Yes Historical Provider, MD  naproxen sodium (ANAPROX) 220 MG tablet Take 220 mg by mouth daily as needed (headache.).    Yes Historical Provider, MD  pioglitazone-metformin (ACTOPLUS MET) 15-850 MG per tablet Take 1 tablet by mouth  every morning.    Yes Historical Provider, MD  sitaGLIPtin (JANUVIA) 100 MG tablet Take 100 mg by mouth daily.   Yes Historical Provider, MD  HYDROcodone-acetaminophen (NORCO/VICODIN) 5-325 MG per tablet Take 1-2 tablets by mouth every 6 (six) hours as needed for moderate pain. Patient not taking: Reported on 09/08/2014 05/14/14   Susanne Borders, NP  lidocaine-prilocaine (EMLA) cream Apply topically as needed. Apply to port-a-cath one - two hours before accessing. Patient not taking: Reported on 09/08/2014 08/30/13   Consuela Mimes, MD  pantoprazole (PROTONIX) 40 MG tablet Take 1 tablet (40 mg total) by mouth daily. Patient not taking: Reported on 10/08/2015 08/09/13   Robbie Lis, MD  sulfamethoxazole-trimethoprim (  BACTRIM DS,SEPTRA DS) 800-160 MG tablet Take 1 tablet by mouth 2 (two) times daily. 10/08/15 10/15/15  Phuc Kluttz, PA-C   BP 147/64 mmHg  Pulse 91  Temp(Src) 99.8 F (37.7 C) (Oral)  Resp 16  SpO2 95% Physical Exam  Constitutional: She is oriented to person, place, and time. She appears well-developed and well-nourished. No distress.  Nontoxic appearing  HENT:  Head: Normocephalic and atraumatic.  Mouth/Throat: Oropharynx is clear and moist. No oropharyngeal exudate.  Eyes: Conjunctivae and EOM are normal. Pupils are equal, round, and reactive to light. Right eye exhibits no discharge. Left eye exhibits no discharge. No scleral icterus.  Neck: Normal range of motion. Neck supple.  Cardiovascular: Normal rate, regular rhythm and normal heart sounds.   Pulmonary/Chest: Effort normal and breath sounds normal. No respiratory distress. She has no wheezes. She has no rales.  S/p left mastectomy  Abdominal: Soft. She exhibits no distension. There is no tenderness. There is no rebound and no guarding.  Musculoskeletal: Normal range of motion.  Lymphadenopathy:    She has no cervical adenopathy.  Neurological: She is alert and oriented to person, place, and time. No cranial nerve  deficit. Coordination normal.  Skin: Skin is warm and dry. No rash noted. She is not diaphoretic.  Psychiatric: She has a normal mood and affect. Her behavior is normal.  Nursing note and vitals reviewed.   ED Course  Procedures (including critical care time) Labs Review Labs Reviewed  CBC WITH DIFFERENTIAL/PLATELET - Abnormal; Notable for the following:    Hemoglobin 11.7 (*)    HCT 35.0 (*)    All other components within normal limits  BASIC METABOLIC PANEL - Abnormal; Notable for the following:    Sodium 134 (*)    Chloride 100 (*)    Glucose, Bld 179 (*)    Creatinine, Ser 1.10 (*)    GFR calc non Af Amer 53 (*)    All other components within normal limits  URINALYSIS, ROUTINE W REFLEX MICROSCOPIC (NOT AT [facility]) - Abnormal; Notable for the following:    Hgb urine dipstick TRACE (*)    Protein, ur 100 (*)    Nitrite POSITIVE (*)    Leukocytes, UA SMALL (*)    All other components within normal limits  URINE MICROSCOPIC-ADD ON - Abnormal; Notable for the following:    Squamous Epithelial / LPF 0-5 (*)    Bacteria, UA MANY (*)    Casts HYALINE CASTS (*)    All other components within normal limits    Imaging Review Dg Chest 2 View  10/08/2015  CLINICAL DATA:  Fever chills body aches cough for 2 days, personal history of breast cancer EXAM: CHEST  2 VIEW COMPARISON:  11/11/2013 FINDINGS: Right-sided internal jugular Port-A-Cath stable. Tip extends into the right atrium. Surgical clips left axilla with evidence of left mastectomy. The heart size and vascular pattern are normal. There is no infiltrate or effusion. IMPRESSION: No active cardiopulmonary disease. Electronically Signed   By: Skipper Cliche M.D.   On: 10/08/2015 18:23   I have personally reviewed and evaluated these images and lab results as part of my medical decision-making.   EKG Interpretation None      MDM   Final diagnoses:  Viral illness  UTI (lower urinary tract infection)   60 year old female  presenting with fever, myalgias, headache, nasal congestion, sinus pressure, rhinorrhea and fatigue 2 days. No sick contacts at home. Temperature 100.2 degrees which reduced to 99.8 after Tylenol. Patient is nontoxic-appearing  and in no acute distress. No sinus tenderness to palpation. No nasal discharge noted. No oropharyngeal erythema or exudate. Lungs clear to auscultation bilaterally. Abdomen is soft, nontender. No elevated white blood cell count. Creatinine at her baseline. Urinalysis positive for UTI. Presentation consistent with a viral illness. Discussed symptomatic care for viral illnesses. Will discharge with Bactrim for UTI. Pt is to follow up with her PCP if symptoms do not improve. Return precautions given in discharge paperwork and discussed with pt at bedside. Pt stable for discharge. Pt is hemodynamically stable & in NAD prior to dc.     Josephina Gip, PA-C 10/08/15 [place], MD 10/08/15 [phone]

## 2015-10-08 NOTE — Discharge Instructions (Signed)
Schedule a follow up appointment with your PCP if your symptoms do not improve in the next 2-3 days. Return to ED with new, worsening or concerning symptoms.    Viral Infections A viral infection can be caused by different types of viruses.Most viral infections are not serious and resolve on their own. However, some infections may cause severe symptoms and may lead to further complications. SYMPTOMS Viruses can frequently cause:  Minor sore throat.  Aches and pains.  Headaches.  Runny nose.  Different types of rashes.  Watery eyes.  Tiredness.  Cough.  Loss of appetite.  Gastrointestinal infections, resulting in nausea, vomiting, and diarrhea. These symptoms do not respond to antibiotics because the infection is not caused by bacteria. However, you might catch a bacterial infection following the viral infection. This is sometimes called a "superinfection." Symptoms of such a bacterial infection may include:  Worsening sore throat with pus and difficulty swallowing.  Swollen neck glands.  Chills and a high or persistent fever.  Severe headache.  Tenderness over the sinuses.  Persistent overall ill feeling (malaise), muscle aches, and tiredness (fatigue).  Persistent cough.  Yellow, green, or brown mucus production with coughing. HOME CARE INSTRUCTIONS   Only take over-the-counter or prescription medicines for pain, discomfort, diarrhea, or fever as directed by your caregiver.  Drink enough water and fluids to keep your urine clear or pale yellow. Sports drinks can provide valuable electrolytes, sugars, and hydration.  Get plenty of rest and maintain proper nutrition. Soups and broths with crackers or rice are fine. SEEK IMMEDIATE MEDICAL CARE IF:   You have severe headaches, shortness of breath, chest pain, neck pain, or an unusual rash.  You have uncontrolled vomiting, diarrhea, or you are unable to keep down fluids.  You or your child has an oral temperature  above 102 F (38.9 C), not controlled by medicine.  Your baby is older than 3 months with a rectal temperature of 102 F (38.9 C) or higher.  Your baby is 38 months old or younger with a rectal temperature of 100.4 F (38 C) or higher. MAKE SURE YOU:   Understand these instructions.  Will watch your condition.  Will get help right away if you are not doing well or get worse.   This information is not intended to replace advice given to you by your health care provider. Make sure you discuss any questions you have with your health care provider.   Document Released: 04/13/2005 Document Revised: 09/26/2011 Document Reviewed: 12/10/2014 Elsevier Interactive Patient Education 2016 [employer]  Urinary Tract Infection Urinary tract infections (UTIs) can develop anywhere along your urinary tract. Your urinary tract is your body's drainage system for removing wastes and extra water. Your urinary tract includes two kidneys, two ureters, a bladder, and a urethra. Your kidneys are a pair of bean-shaped organs. Each kidney is about the size of your fist. They are located below your ribs, one on each side of your spine. CAUSES Infections are caused by microbes, which are microscopic organisms, including fungi, viruses, and bacteria. These organisms are so small that they can only be seen through a microscope. Bacteria are the microbes that most commonly cause UTIs. SYMPTOMS  Symptoms of UTIs may vary by age and gender of the patient and by the location of the infection. Symptoms in young women typically include a frequent and intense urge to urinate and a painful, burning feeling in the bladder or urethra during urination. Older women and men are more likely to  be tired, shaky, and weak and have muscle aches and abdominal pain. A fever may mean the infection is in your kidneys. Other symptoms of a kidney infection include pain in your back or sides below the ribs, nausea, and vomiting. DIAGNOSIS To  diagnose a UTI, your caregiver will ask you about your symptoms. Your caregiver will also ask you to provide a urine sample. The urine sample will be tested for bacteria and white blood cells. White blood cells are made by your body to help fight infection. TREATMENT  Typically, UTIs can be treated with medication. Because most UTIs are caused by a bacterial infection, they usually can be treated with the use of antibiotics. The choice of antibiotic and length of treatment depend on your symptoms and the type of bacteria causing your infection. HOME CARE INSTRUCTIONS  If you were prescribed antibiotics, take them exactly as your caregiver instructs you. Finish the medication even if you feel better after you have only taken some of the medication.  Drink enough water and fluids to keep your urine clear or pale yellow.  Avoid caffeine, tea, and carbonated beverages. They tend to irritate your bladder.  Empty your bladder often. Avoid holding urine for long periods of time.  Empty your bladder before and after sexual intercourse.  After a bowel movement, women should cleanse from front to back. Use each tissue only once. SEEK MEDICAL CARE IF:   You have back pain.  You develop a fever.  Your symptoms do not begin to resolve within 3 days. SEEK IMMEDIATE MEDICAL CARE IF:   You have severe back pain or lower abdominal pain.  You develop chills.  You have nausea or vomiting.  You have continued burning or discomfort with urination. MAKE SURE YOU:   Understand these instructions.  Will watch your condition.  Will get help right away if you are not doing well or get worse.   This information is not intended to replace advice given to you by your health care provider. Make sure you discuss any questions you have with your health care provider.   Document Released: 04/13/2005 Document Revised: 03/25/2015 Document Reviewed: 08/12/2011 Elsevier Interactive Patient Education [employer]

## 2015-10-27 ENCOUNTER — Other Ambulatory Visit: Payer: Self-pay | Admitting: Nurse Practitioner

## 2015-10-27 ENCOUNTER — Telehealth: Payer: Self-pay | Admitting: Oncology

## 2015-10-27 DIAGNOSIS — C50412 Malignant neoplasm of upper-outer quadrant of left female breast: Secondary | ICD-10-CM

## 2015-10-27 MED FILL — ANASTROZOLE 1 MG TABLET: 1 | 90 days supply | Qty: 90 | Fill #0

## 2015-10-27 MED FILL — METOPROLOL SUCC ER 25 MG TA: 25 | 90 days supply | Qty: 90 | Fill #0

## 2015-10-27 MED FILL — LOSARTAN POTASSIUM 100 MG T: 100 | 30 days supply | Qty: 30 | Fill #0

## 2015-10-27 NOTE — Telephone Encounter (Signed)
ptaient called to r/s May 2 appt due to being out of town. R/s to May 30

## 2015-11-12 DIAGNOSIS — B351 Tinea unguium: Secondary | ICD-10-CM | POA: Diagnosis not present

## 2015-11-12 DIAGNOSIS — Z79899 Other long term (current) drug therapy: Secondary | ICD-10-CM | POA: Diagnosis not present

## 2015-11-12 DIAGNOSIS — E1165 Type 2 diabetes mellitus with hyperglycemia: Secondary | ICD-10-CM | POA: Diagnosis not present

## 2015-11-12 DIAGNOSIS — I10 Essential (primary) hypertension: Secondary | ICD-10-CM | POA: Diagnosis not present

## 2015-11-17 ENCOUNTER — Other Ambulatory Visit: Payer: PPO

## 2015-11-17 ENCOUNTER — Ambulatory Visit: Payer: PPO | Admitting: Oncology

## 2015-12-11 ENCOUNTER — Other Ambulatory Visit: Payer: Self-pay

## 2015-12-11 DIAGNOSIS — C50412 Malignant neoplasm of upper-outer quadrant of left female breast: Secondary | ICD-10-CM

## 2015-12-15 ENCOUNTER — Ambulatory Visit (HOSPITAL_COMMUNITY)
Admission: RE | Admit: 2015-12-15 | Discharge: 2015-12-15 | Disposition: A | Discharge status: Home / Self Care | Payer: PPO | Source: Ambulatory Visit | Attending: Oncology | Admitting: Oncology

## 2015-12-15 ENCOUNTER — Ambulatory Visit (HOSPITAL_BASED_OUTPATIENT_CLINIC_OR_DEPARTMENT_OTHER): Payer: PPO | Admitting: Oncology

## 2015-12-15 ENCOUNTER — Telehealth: Payer: Self-pay | Admitting: Oncology

## 2015-12-15 ENCOUNTER — Ambulatory Visit: Payer: PPO

## 2015-12-15 ENCOUNTER — Other Ambulatory Visit (HOSPITAL_BASED_OUTPATIENT_CLINIC_OR_DEPARTMENT_OTHER): Payer: PPO

## 2015-12-15 VITALS — BP 145/59 | HR 84 | Temp 97.9°F | Resp 18 | Ht 62.0 in | Wt 216.3 lb

## 2015-12-15 DIAGNOSIS — Z170 Estrogen receptor positive status [ER+]: Secondary | ICD-10-CM | POA: Diagnosis not present

## 2015-12-15 DIAGNOSIS — C773 Secondary and unspecified malignant neoplasm of axilla and upper limb lymph nodes: Secondary | ICD-10-CM

## 2015-12-15 DIAGNOSIS — Z95828 Presence of other vascular implants and grafts: Secondary | ICD-10-CM

## 2015-12-15 DIAGNOSIS — D0511 Intraductal carcinoma in situ of right breast: Secondary | ICD-10-CM | POA: Diagnosis not present

## 2015-12-15 DIAGNOSIS — M25551 Pain in right hip: Secondary | ICD-10-CM

## 2015-12-15 DIAGNOSIS — C50412 Malignant neoplasm of upper-outer quadrant of left female breast: Secondary | ICD-10-CM

## 2015-12-15 DIAGNOSIS — F319 Bipolar disorder, unspecified: Secondary | ICD-10-CM

## 2015-12-15 DIAGNOSIS — M8580 Other specified disorders of bone density and structure, unspecified site: Secondary | ICD-10-CM

## 2015-12-15 DIAGNOSIS — M1611 Unilateral primary osteoarthritis, right hip: Secondary | ICD-10-CM | POA: Diagnosis not present

## 2015-12-15 DIAGNOSIS — M160 Bilateral primary osteoarthritis of hip: Secondary | ICD-10-CM | POA: Diagnosis not present

## 2015-12-15 LAB — COMPREHENSIVE METABOLIC PANEL
ALT: 13 U/L (ref 0–55)
ANION GAP: 8 meq/L (ref 3–11)
AST: 13 U/L (ref 5–34)
Albumin: 3.5 g/dL (ref 3.5–5.0)
Alkaline Phosphatase: 109 U/L (ref 40–150)
BILIRUBIN TOTAL: 0.54 mg/dL (ref 0.20–1.20)
BUN: 22.4 mg/dL (ref 7.0–26.0)
CO2: 22 mEq/L (ref 22–29)
CREATININE: 1.2 mg/dL — AB (ref 0.6–1.1)
Calcium: 9.2 mg/dL (ref 8.4–10.4)
Chloride: 106 mEq/L (ref 98–109)
EGFR: 59 mL/min/{1.73_m2} — AB (ref >90)
Glucose: 317 mg/dl — ABNORMAL HIGH (ref 70–140)
Potassium: 3.9 mEq/L (ref 3.5–5.1)
Sodium: 135 mEq/L — ABNORMAL LOW (ref 136–145)
Total Protein: 7 g/dL (ref 6.4–8.3)

## 2015-12-15 LAB — CBC WITH DIFFERENTIAL/PLATELET
BASO%: 0.4 % (ref 0.0–2.0)
Basophils Absolute: 0 10*3/uL (ref 0.0–0.1)
EOS%: 2.2 % (ref 0.0–7.0)
Eosinophils Absolute: 0.2 10*3/uL (ref 0.0–0.5)
HCT: 34.7 % — ABNORMAL LOW (ref 34.8–46.6)
HEMOGLOBIN: 11.2 g/dL — AB (ref 11.6–15.9)
LYMPH%: 23 % (ref 14.0–49.7)
MCH: 29.4 pg (ref 25.1–34.0)
MCHC: 32.3 g/dL (ref 31.5–36.0)
MCV: 91.1 fL (ref 79.5–101.0)
MONO#: 0.5 10*3/uL (ref 0.1–0.9)
MONO%: 6.6 % (ref 0.0–14.0)
NEUT%: 67.8 % (ref 38.4–76.8)
NEUTROS ABS: 4.7 10*3/uL (ref 1.5–6.5)
PLATELETS: 304 10*3/uL (ref 145–400)
RBC: 3.81 10*6/uL (ref 3.70–5.45)
RDW: 12.9 % (ref 11.2–14.5)
WBC: 6.9 10*3/uL (ref 3.9–10.3)
lymph#: 1.6 10*3/uL (ref 0.9–3.3)

## 2015-12-15 MED ORDER — SODIUM CHLORIDE 0.9% FLUSH
10.0000 mL | INTRAVENOUS | Status: DC | PRN
  Administered 2015-12-15: 10 mL via INTRAVENOUS
  Filled 2015-12-15: qty 10

## 2015-12-15 MED ORDER — ANASTROZOLE 1 MG PO TABS: 1.0000 mg | ORAL_TABLET | Freq: Every day | ORAL | Status: DC

## 2015-12-15 MED ORDER — LIDOCAINE-PRILOCAINE 2.5-2.5 % EX CREA: TOPICAL_CREAM | CUTANEOUS | Status: DC | PRN

## 2015-12-15 MED ORDER — HEPARIN SOD (PORK) LOCK FLUSH 100 UNIT/ML IV SOLN
500.0000 [IU] | Freq: Once | INTRAVENOUS | Status: AC
  Administered 2015-12-15: 500 [IU] via INTRAVENOUS
  Filled 2015-12-15: qty 5

## 2015-12-15 NOTE — Telephone Encounter (Signed)
appt made and avs printed °

## 2015-12-15 NOTE — Patient Instructions (Signed)

## 2015-12-15 NOTE — Progress Notes (Signed)
[place]  [phone] Fax:(336) [phone]     ID: SANTA ABDELRAHMAN DOB: 24-Feb-1956  MR#: [id number]  YIR#:[id number]  Patient Care Team: Glendale Chard, MD as PCP - General (Internal Medicine) Fanny Skates, MD as Consulting Physician (General Surgery) Thea Silversmith, MD as Consulting Physician (Radiation Oncology)  CHIEF COMPLAINT: stage IIIA, estrogen receptor positive breast cancer  CURRENT TREATMENT: anastrozole   BREAST CANCER HISTORY: From Dr. Dana Allan original intake note, 04/04/2013:  "Kathy Maxwell is a 59 y.o. female. With other medical problems who underwent a screening mammogram that led to a diagnostic mammogram and ultrasound of the left breast. The mammogram and ultrasound showed a 2.9 x 1.6 x 1.9 cm suspicious mass in the left breast at the 12:30 oh clock position 6 cm from the nipple. There was also a suspicious left axillary lymph node. She had image guided biopsy of the left breast mass and the lymph node. The pathology revealed an invasive mammary carcinoma probably ductal phenotype. Tumor was estrogen receptor +100% progesterone receptor +98% proliferation marker Ki-67 13% and HER-2/neu negative. Patient had MRI of the breasts performed on 03/15/2013 the left breast revealed a 7.4 cm area of what the lymph node. Right breast revealed and a 3.3 cm mass. Patient was seen by Dr. Fanny Skates for discussion of surgical options. He has referred the patient to medical oncology for discussion whether or not she will need a Port-A-Cath and chemotherapy."  The patient underwent left modified radical mastectomy 04/22/2014 showing multiple foci of low-grade invasive ductal carcinoma, the largest single lesion measuring 5.5 cm, and involving 4/22 lymph nodes. Repeat HER-2 study was again negative. 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 The patient proceeded to adjuvant chemotherapy completed May 2015 as summarized below. She is currently receiving radiation therapy.  The patient's subsequent history is detailed below.  INTERVAL HISTORY: Kathy Maxwell returns today for follow-up of her estrogen receptor positive breast cancer. She continues on anastrozole, with excellent tolerance. Hot flashes, night sweats, vaginal dryness, and arthralgias/myalgias are not a problem. She obtains it at a very good price.  REVIEW OF SYSTEMS: Kathy Maxwell generally is "enjoying life", but when asked what her work problem is she says in the right hip area she has a crampy pain that is constant and keeps her from walking as much as she would like. 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 She has a little stress urinary incontinence. Of course she has other areas of arthritis. Her blood sugars are "okay" a detailed review of systems today was otherwise stable.  PAST MEDICAL HISTORY: Past Medical History  Diagnosis Date  . Hyperlipidemia   . Hypertension   . Bipolar 1 disorder ([place])   . Cancer ([place])   . Hypertension 04/04/2013  . Hyperlipidemia 04/04/2013  . Bipolar 1 disorder ([place]) 04/04/2013  . Breast cancer ([place]) 03/10/13    left breast invasive ca  . Allergy     latex  . Obesity   . Diabetes mellitus without complication ([facility])     NIDDM  . Sleep apnea     no cpap due to insurance  . Heart murmur   . GERD (gastroesophageal reflux disease)   . Coronary artery disease   .  Radiation 01/30/14-03/19/14    PAST SURGICAL HISTORY: Past Surgical History  Procedure Laterality Date  . Cholecystectomy    . Breast biopsy Left 05/08/09    fibroadenoma with microcalcifications,no malignancy id  . Breast biopsy Left 03/07/13    invasive ca,1 lymph node metastatic  . Breast biopsy  Bilateral 03/07/13    left-invasive ductal ca,DCIS, Right=DCIS,ER/PR=+her2=  . Abdominal hysterectomy    . Coronary stent placement  2001  . Cardiac catheterization    . Eye surgery  6/13,9/14    laser,cataract lft  . Coronary angioplasty      LAD stent 2003  . Portacath placement Right 04/22/2013    Procedure: INSERTION PORT-A-CATH;  Surgeon: Adin Hector, MD;  Location: [facility];  Service: General;  Laterality: Right;  . Mastectomy modified radical Left 04/22/2013    Procedure: MASTECTOMY MODIFIED RADICAL;  Surgeon: Adin Hector, MD;  Location: [facility];  Service: General;  Laterality: Left;  . Breast lumpectomy with needle localization and axillary sentinel lymph node bx Right 04/22/2013    Procedure: BREAST LUMPECTOMY WITH NEEDLE LOCALIZATION AND AXILLARY SENTINEL LYMPH NODE BX;  Surgeon: Adin Hector, MD;  Location: [facility];  Service: General;  Laterality: Right;  . Esophagogastroduodenoscopy N/A 08/07/2013    Procedure: ESOPHAGOGASTRODUODENOSCOPY (EGD);  Surgeon: Missy Sabins, MD;  Location: [facility];  Service: Endoscopy;  Laterality: N/A;  . Esophagogastroduodenoscopy N/A 11/13/2013    Procedure: ESOPHAGOGASTRODUODENOSCOPY (EGD);  Surgeon: Wonda Horner, MD;  Location: [facility];  Service: Endoscopy;  Laterality: N/A;    FAMILY HISTORY Family History  Problem Relation Age of Onset  . Heart disease Mother   . Breast cancer Maternal Aunt     dx in her 59s  . Heart disease Cousin 7    maternal cousin  . Heart attack Sister 42  . Cancer Paternal Aunt     NOS  . Cancer Maternal Aunt     NOS  . Lung cancer Cousin 63    maternal cousin - non smoker  . Brain cancer Cousin 13    maternal cousin's son  . Lung cancer Cousin 53    maternal cousin's son  . Breast cancer Cousin     paternal cousin   the patient's father died from alcoholic cirrhosis at the age of 83. The patient's mother died from congestive heart failure the age of 22. The patient had no brothers, 4 sisters.  One sister died from a myocardial infarction at the age of 71. The only other breast cancers in the family are a maternal aunt(Otto 38) with breast cancer diagnosed after the age of 39 and a second cousin on the mother's side, diagnosed with breast cancer approximately age 37. There is no history of ovarian cancer in the family. The patient has undergone genetic testing and his BRCA negative  GYNECOLOGIC HISTORY:  No LMP recorded. Patient is postmenopausal. Menarche age 57. The patient is GX P0. She underwent hysterectomy with bilateral salpingo-oophorectomy in 1997. She took hormone replacement briefly.  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 She is single. Currently she shares a home with a friend, but is looking for her own place. She attends a local church of [place]: Not in place. At her 02/12/2014 visit the patient was given the appropriate documents to complete and notarize at her discretion. She tells me she intends to name her goddaughter,, Gerlene Burdock, who is a CNA, as her  healthcare power of attorney. Ms. Ky Barban can be reached at [place]: Social History  Substance Use Topics  . Smoking status: Former Smoker -- 1.00 packs/day for 15 years    Types: Cigarettes    Quit date: 07/19/1987  . Smokeless tobacco: Never Used  . Alcohol Use: No     Colonoscopy: Never  PAP:  Bone density: Never  Lipid panel:  Allergies  Allergen Reactions  . Cephalexin Other (See Comments)    Burned throat.  . Adhesive [Tape] Rash    Clear tape gloves ,elastic no problem  . Latex Hives and Rash    Current Outpatient Prescriptions  Medication Sig Dispense Refill  . pantoprazole (PROTONIX) 40 MG tablet Take 1 tablet (40 mg total) by mouth daily. 30 tablet 3  . amLODipine (NORVASC) 10 MG tablet Take 10 mg by mouth daily.    [name] anastrozole (ARIMIDEX) 1 MG tablet Take 1 tablet (1 mg total) by mouth  daily. 90 tablet 4  . b complex vitamins tablet Take 1 tablet by mouth daily.    [name] buPROPion (WELLBUTRIN SR) 150 MG 12 hr tablet Take 1 tablet (150 mg total) by mouth daily. 30 tablet 6  . Cholecalciferol (VITAMIN D PO) Take 1 tablet by mouth daily.    . fluticasone (FLONASE) 50 MCG/ACT nasal spray Place 1 spray into both nostrils daily. 9.9 g 0  . gabapentin (NEURONTIN) 100 MG capsule TAKE 3 CAPSULES BY MOUTH THREE TIMES DAILY 270 capsule 6  . hydrALAZINE (APRESOLINE) 25 MG tablet Take 25 mg by mouth 3 (three) times daily.     [name] lidocaine-prilocaine (EMLA) cream Apply topically as needed. Apply to port-a-cath one - two hours before accessing. 30 g 3  . losartan (COZAAR) 100 MG tablet Take 1 tablet (100 mg total) by mouth daily. 90 tablet 0  . metoprolol succinate (TOPROL-XL) 25 MG 24 hr tablet Take 25 mg by mouth daily.     . naproxen sodium (ANAPROX) 220 MG tablet Take 220 mg by mouth daily as needed (headache.).     [name] pioglitazone-metformin (ACTOPLUS MET) 15-850 MG per tablet Take 1 tablet by mouth every morning.     . sitaGLIPtin (JANUVIA) 100 MG tablet Take 100 mg by mouth daily.     No current facility-administered medications for this visit.   Facility-Administered Medications Ordered in Other Visits  Medication Dose Route Frequency Provider Last Rate Last Dose  . sodium chloride 0.9 % injection 10 mL  10 mL Intravenous PRN Consuela Mimes, MD   10 mL at 05/24/13 1522  . sodium chloride flush (NS) 0.9 % injection 10 mL  10 mL Intravenous PRN Chauncey Cruel, MD   10 mL at 12/15/15 1141    OBJECTIVE: Middle-aged womanIn no acute distress Filed Vitals:   12/15/15 1153  BP: 145/59  Pulse: 84  Temp: 97.9 F (36.6 C)  Resp: 18     Body mass index is 39.55 kg/(m^2).    ECOG FS:1 - Symptomatic but completely ambulatory  Sclerae unicteric, EOMs intact Oropharynx clear and moist-- no thrush or other lesions No cervical or supraclavicular adenopathy Lungs no rales or rhonchi Heart  regular rate and rhythm Abd soft, obese, nontender, positive bowel sounds MSK no focal spinal tenderness, no upper extremity lymphedema, straight leg raising bilaterally to about 60, but with pain on the right hip area Neuro: nonfocal, well oriented, appropriate affect Breasts: The right breast shows no masses or discharge. The left breast is status post mastectomy.  There is no evidence of local recurrence. The left axilla is benign.   LAB RESULTS:  CMP     Component Value Date/Time   NA 134* 10/08/2015 2022   NA 136 05/01/2015 1332   K 4.5 10/08/2015 2022   K 4.2 05/01/2015 1332   CL 100* 10/08/2015 2022   CO2 25 10/08/2015 2022   CO2 27 05/01/2015 1332   GLUCOSE 179* 10/08/2015 2022   GLUCOSE 298* 05/01/2015 1332   BUN 18 10/08/2015 2022   BUN 15.5 05/01/2015 1332   CREATININE 1.10* 10/08/2015 2022   CREATININE 1.1 05/01/2015 1332   CALCIUM 9.3 10/08/2015 2022   CALCIUM 9.8 05/01/2015 1332   PROT 7.5 05/01/2015 1332   PROT 6.3 02/13/2014 2308   ALBUMIN 3.8 05/01/2015 1332   ALBUMIN 3.4* 02/13/2014 2308   AST 12 05/01/2015 1332   AST 14 02/13/2014 2308   ALT 13 05/01/2015 1332   ALT 8 02/13/2014 2308   ALKPHOS 142 05/01/2015 1332   ALKPHOS 92 02/13/2014 2308   BILITOT <0.30 05/01/2015 1332   BILITOT 0.3 02/13/2014 2308   GFRNONAA 53* 10/08/2015 2022   GFRAA >60 10/08/2015 2022    I No results found for: SPEP  Lab Results  Component Value Date   WBC 6.9 12/15/2015   NEUTROABS 4.7 12/15/2015   HGB 11.2* 12/15/2015   HCT 34.7* 12/15/2015   MCV 91.1 12/15/2015   PLT 304 12/15/2015      Chemistry      Component Value Date/Time   NA 134* 10/08/2015 2022   NA 136 05/01/2015 1332   K 4.5 10/08/2015 2022   K 4.2 05/01/2015 1332   CL 100* 10/08/2015 2022   CO2 25 10/08/2015 2022   CO2 27 05/01/2015 1332   BUN 18 10/08/2015 2022   BUN 15.5 05/01/2015 1332   CREATININE 1.10* 10/08/2015 2022   CREATININE 1.1 05/01/2015 1332      Component Value Date/Time    CALCIUM 9.3 10/08/2015 2022   CALCIUM 9.8 05/01/2015 1332   ALKPHOS 142 05/01/2015 1332   ALKPHOS 92 02/13/2014 2308   AST 12 05/01/2015 1332   AST 14 02/13/2014 2308   ALT 13 05/01/2015 1332   ALT 8 02/13/2014 2308   BILITOT <0.30 05/01/2015 1332   BILITOT 0.3 02/13/2014 2308       No results found for: LABCA2  No components found for: LABCA125  No results for input(s): INR in the last 168 hours.  Urinalysis    Component Value Date/Time   COLORURINE YELLOW 10/08/2015 2032   APPEARANCEUR CLEAR 10/08/2015 2032   LABSPEC 1.017 10/08/2015 2032   PHURINE 6.5 10/08/2015 2032   GLUCOSEU NEGATIVE 10/08/2015 2032   HGBUR TRACE* 10/08/2015 2032   BILIRUBINUR NEGATIVE 10/08/2015 2032   KETONESUR NEGATIVE 10/08/2015 2032   PROTEINUR 100* 10/08/2015 2032   UROBILINOGEN 1.0 11/11/2013 1054   NITRITE POSITIVE* 10/08/2015 2032   LEUKOCYTESUR SMALL* 10/08/2015 2032    STUDIES:  CLINICAL DATA: Annual diagnostic mammogram. Patient has history of left mastectomy. She has a more recent history of right lumpectomy for breast carcinoma in 2014. No current complaints.  EXAM: DIGITAL DIAGNOSTIC RIGHT MAMMOGRAM WITH 3D TOMOSYNTHESIS AND CAD  COMPARISON: Previous exam(s).  ACR Breast Density Category c: The breast tissue is heterogeneously dense, which may obscure small masses.  FINDINGS: The architectural distortion in the lateral right breast reflecting postsurgical scarring is relatively stable from the most recent mammogram. There are no discrete masses or other areas of distortion. There are  no suspicious calcifications.  Mammographic images were processed with CAD.  IMPRESSION: No evidence of recurrent or new breast malignancy. Benign postsurgical changes on the right.  RECOMMENDATION: Diagnostic mammography in 1 year per standard post lumpectomy protocol.  I have discussed the findings and recommendations with the patient. Results were also provided in  writing at the conclusion of the visit. If applicable, a reminder letter will be sent to the patient regarding the next appointment.  BI-RADS CATEGORY 2: Benign.   Electronically Signed  By: Lajean Manes M.D.  On: 07/30/2015 13:41     ASSESSMENT: 60 y.o. BRCA negative [place] woman  LEFT BREAST (1) status post left breast upper outer quadrant and left axillary lymph node biopsy 03/07/2013, both positive for an invasive ductal carcinoma, grade 1 or 2, estrogen receptor and progesterone receptor both strongly positive, HER-2 negative, with an MIB-1 between 13% and 15%  (2) biopsy of a more anterior mass in the left breast 04/03/2013 showed invasive ductal carcinoma, grade 2, estrogen and progesterone receptor positive, HER-2 negative, with an MIB-1 of 33%  (3) status post left modified radical mastectomy 04/22/2013 for mpT3 pN2, stage IIIA invasive ductal carcinoma, grade 1, repeat HER-2 again negative; 4 of 22 lymph nodes positive; ample margins   RIGHT BREAST (4) status post right central breast biopsy 04/03/2013 for ductal carcinoma in situ, 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% positive, progesterone receptor 11% positive   (5) right lumpectomy and sentinel lymph node sampling 04/22/2013 showed ductal carcinoma in situ, 3 cm, margins less than 1 mm medially  (a) skin biopsy right breast 06/17/2014 showed no evidence of malignancy.  ADJUVANT CHEMOTHERAPY (6) cyclophosphamide, epirubicin, and fluorouracil given every 14 days x6 cycles completed 08/02/2013  (7) weekly paclitaxel x2, discontinued 08/23/2013, with poor tolerance   (8) Abraxane given day 1 day 8 of each 21 day cycle, with some delays, for a total 8 doses, completed 11/23/2023  ADJUVANT RADIATION (9) adjuvant radiation therapy completed 03/19/2014  ANTI-ESTROGEN THERAPY (10) started anastrozole 04/16/2014; dexa scan 04/03/2014 showed mild osteopenia (T - 1.4)  GENETIC TESTING (11) a broad genetic panel sent December  2014 showed a variant of uncertain significance, 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  There were no deleterious mutations noted in the other genes tested, which included ATM, BARD1, BRCA1, BRCA2, BRIP1, CDH1, CHEK2, EPCAM, FANCC, MLH1, MSH2, MSH6, NBN, PALB2, PMS2, PTEN, RAD51C, RAD51D, STK11, TP53, and XRCC2.    PLAN: Kathy Maxwell is now 2-1/2 years out from her definitive surgery with no evidence of disease recurrence. This is very favorable.  She is tolerating the anastrozole with no significant side effects. The plan is to continue that for a total of 5 years.  She very likely has some arthritis in the right hip but we're going to obtain plain films of that area today just to make sure.  She has chosen to keep her port so today I refilled her EMLA cream. She will be set up for port flushes on a regular basis.  She is behind on her health maintenance. I placed a referral to [facility] so they can schedule her for colonoscopy.    Otherwise I will see her again in February 2018 and yearly thereafter until she completes her 5 years of follow-up  She knows to call for any problems that may develop before her next visit here.  Chauncey Cruel, MD   12/15/2015 12:00 PM

## 2015-12-16 ENCOUNTER — Encounter: Payer: Self-pay | Admitting: Internal Medicine

## 2015-12-17 ENCOUNTER — Telehealth: Payer: Self-pay | Admitting: *Deleted

## 2015-12-17 NOTE — Telephone Encounter (Signed)
This RN called pt per results of hip xray reviewed by MD. Informed pt of no cancer per reading and noted arthritis. Discussed interventions of benefit. No further questions.

## 2015-12-31 DIAGNOSIS — Z853 Personal history of malignant neoplasm of breast: Secondary | ICD-10-CM | POA: Diagnosis not present

## 2016-01-07 MED FILL — LIDOCAINE-PRILOCAINE CREAM: 2.5-2.5 | 30 days supply | Qty: 30 | Fill #0

## 2016-01-07 MED FILL — LOSARTAN POTASSIUM 100 MG T: 100 | 30 days supply | Qty: 30 | Fill #1

## 2016-01-07 MED FILL — GABAPENTIN 100 MG CAPSULE: 100 | 30 days supply | Qty: 270 | Fill #0

## 2016-02-01 ENCOUNTER — Ambulatory Visit (AMBULATORY_SURGERY_CENTER): Payer: PPO | Admitting: *Deleted

## 2016-02-01 ENCOUNTER — Telehealth: Payer: Self-pay | Admitting: *Deleted

## 2016-02-01 VITALS — Ht 62.0 in | Wt 215.8 lb

## 2016-02-01 DIAGNOSIS — Z1211 Encounter for screening for malignant neoplasm of colon: Secondary | ICD-10-CM

## 2016-02-01 MED FILL — ANASTROZOLE 1 MG TABLET: 1 | 90 days supply | Qty: 90 | Fill #1

## 2016-02-01 MED FILL — METOPROLOL SUCC ER 25 MG TA: 25 | 90 days supply | Qty: 90 | Fill #1

## 2016-02-01 MED FILL — LOSARTAN POTASSIUM 100 MG T: 100 | 30 days supply | Qty: 30 | Fill #2

## 2016-02-01 NOTE — Telephone Encounter (Signed)
She should be ok direct

## 2016-02-01 NOTE — Progress Notes (Signed)
No allergies to eggs or soy. No problems with anesthesia.  Pt given Emmi instructions for colonoscopy  No oxygen use  No diet drug use  

## 2016-02-01 NOTE — Telephone Encounter (Signed)
Dr Carlean Purl: Kathy Maxwell is scheduled for direct screening colonoscopy  7/25 at [facility].. Pt has history of breast cancer with mastectomy 2015.  She still has port a cath because she is hard to find a vein.  Is she ok for direct [facility]?  Thanks, Juliann Pulse

## 2016-02-01 NOTE — Telephone Encounter (Signed)
Pt notified ok to proceed with colonoscopy as scheduled at [facility]

## 2016-02-03 IMAGING — MR MR HEAD W/O CM
8 of 10 series · 34 of 48 positions shown · non-contrast
Comparison: Prior CT from earlier the same day as well as MRI
report from 01/30/2002.

CLINICAL DATA: HEADACHE LUE weakness, breast cancer HEADACHE LUE
weakness, breast cancer

EXAM:
MRI HEAD WITHOUT CONTRAST
TECHNIQUE: Multiplanar, multiecho pulse sequences of the brain and surrounding
structures were obtained without intravenous contrast.

[Series 3: T1 · sagittal · 5.0mm · 0.47mm/px · 3 of 21 slices shown]
[im 1/21]
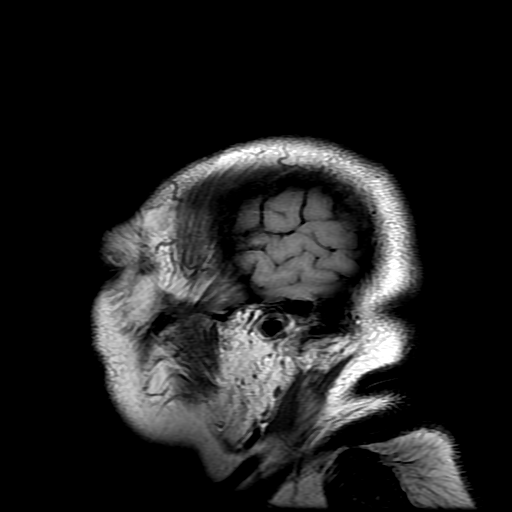
[im 11/21]
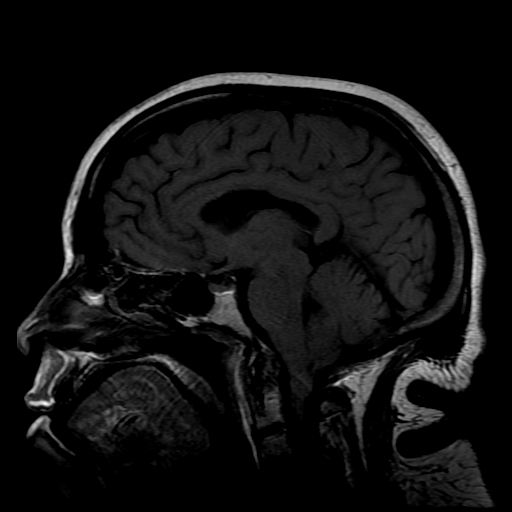
[im 21/21]
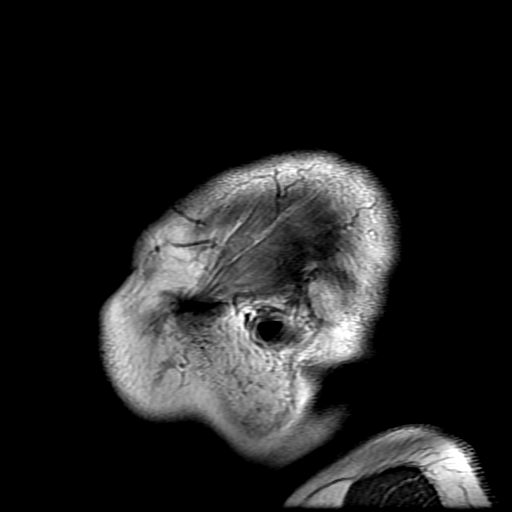

[Series 4: DWI · axial · 5.0mm · 1.09mm/px · z∈[-29,+100]mm · 7 of 54 slices shown (1 of 4)]
[im 1/54]
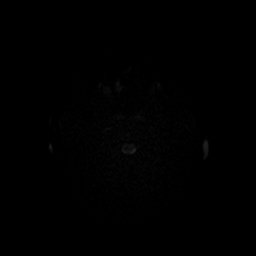
[im 9/54]
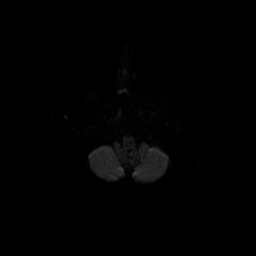
[im 18/54]
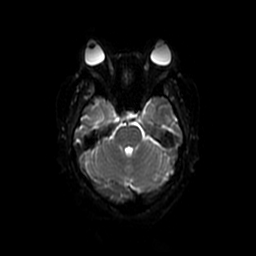
[im 27/54]
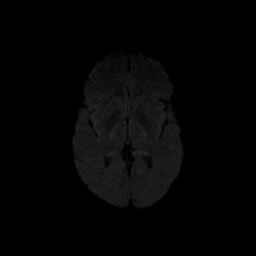
[im 36/54]
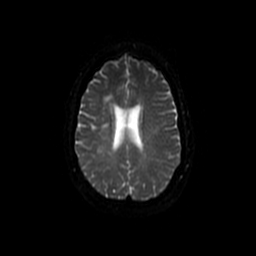
[im 45/54]
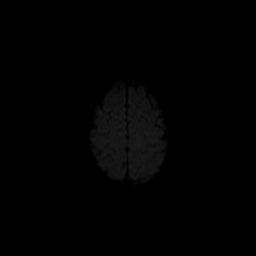
[im 54/54]
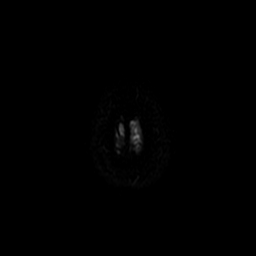

[Series 5: DWI · coronal · 5.0mm · 1.09mm/px · 8 of 66 slices shown (2 of 4)]
[im 1/66]
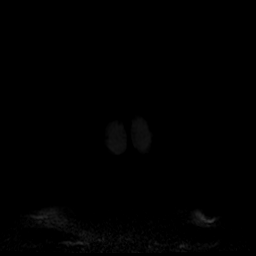
[im 10/66]
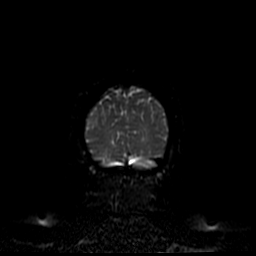
[im 19/66]
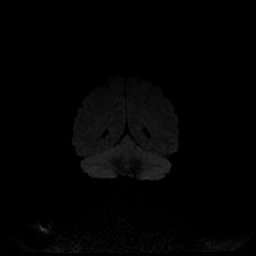
[im 28/66]
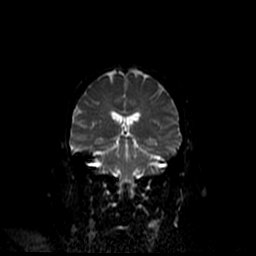
[im 38/66]
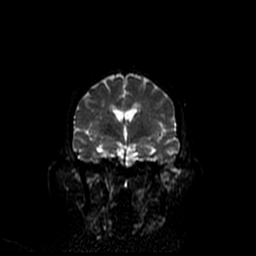
[im 47/66]
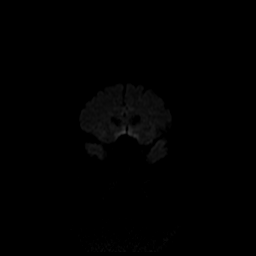
[im 56/66]
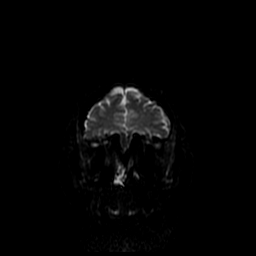
[im 66/66]
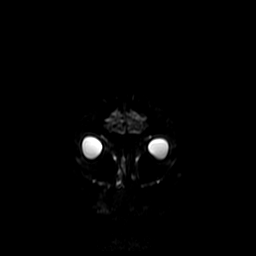

[Series 7: FLAIR · axial · 5.0mm · 0.43mm/px · z∈[-34,+96]mm · 3 of 23 slices shown]
[im 1/23]
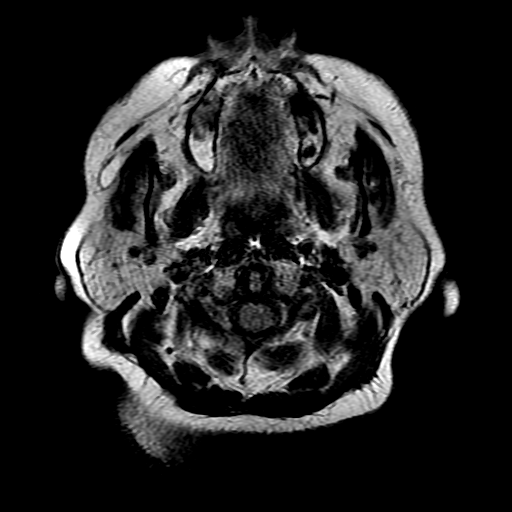
[im 12/23]
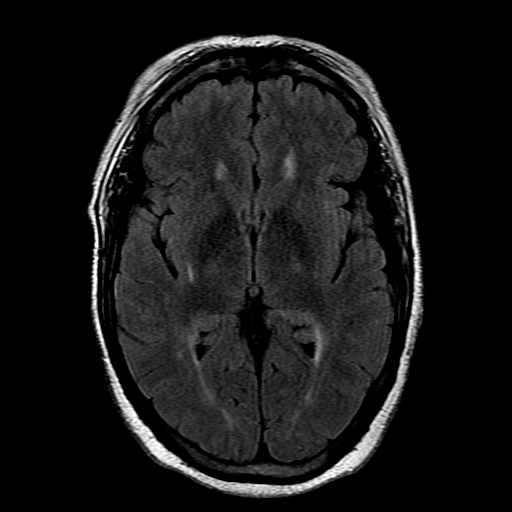
[im 23/23]
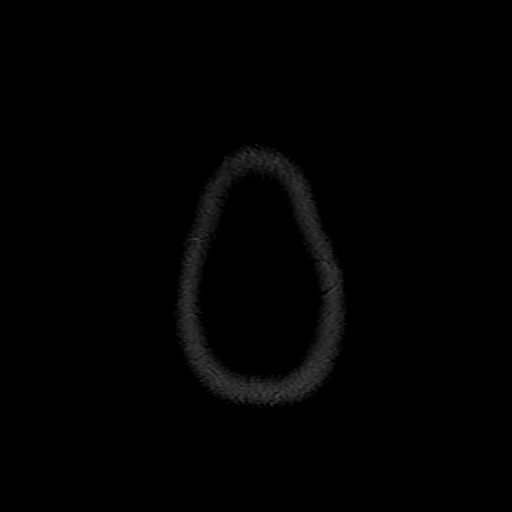

[Series 10: T2 · coronal · 5.0mm · 0.39mm/px · 3 of 25 slices shown (1 of 2)]
[im 1/25]
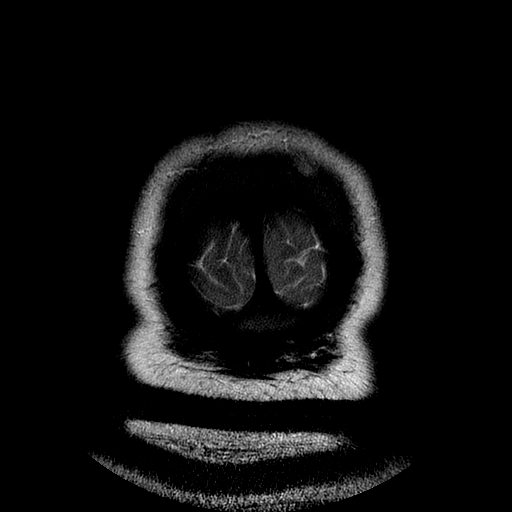
[im 13/25]
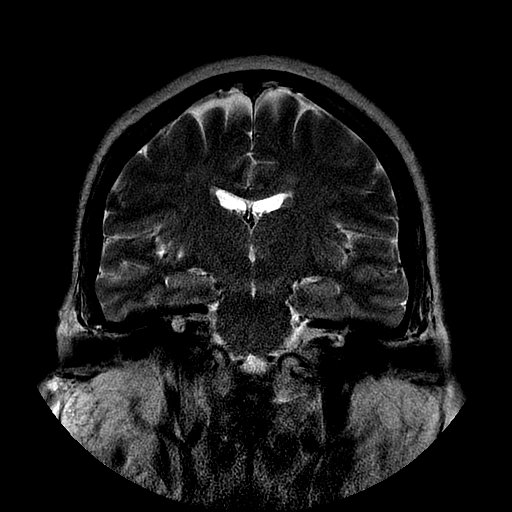
[im 25/25]
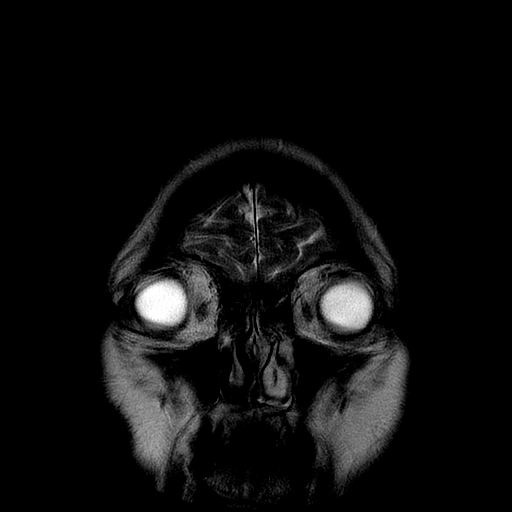

[Series 11: T2 · axial · 5.0mm · 0.43mm/px · z∈[-34,+96]mm · 3 of 23 slices shown (2 of 2)]
[im 1/23]
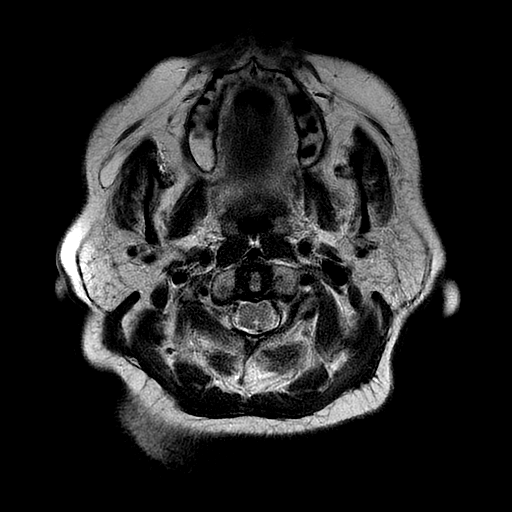
[im 12/23]
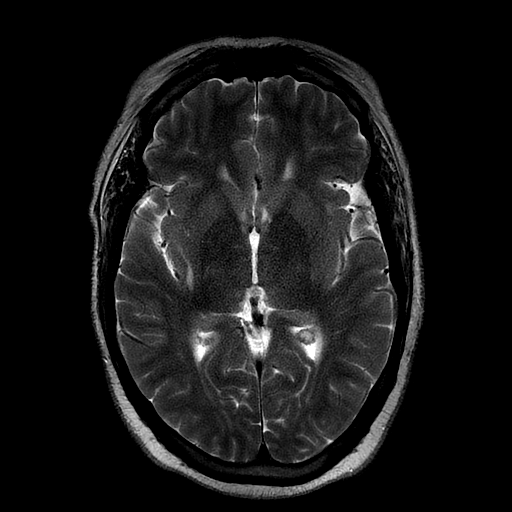
[im 23/23]
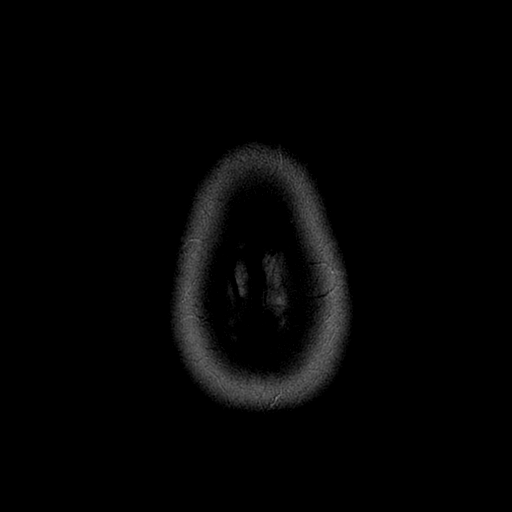

[Series 400: DWI · axial · 5.0mm · 1.09mm/px · z∈[-29,+100]mm · 3 of 27 slices shown (3 of 4)]
[im 1/27]
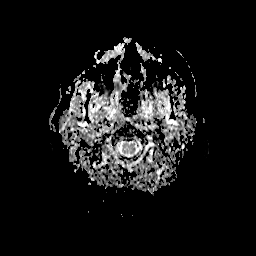
[im 14/27]
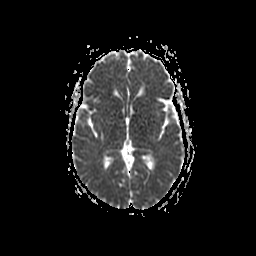
[im 27/27]
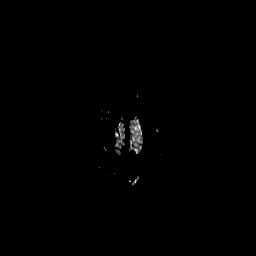

[Series 500: DWI · coronal · 5.0mm · 1.09mm/px · 4 of 33 slices shown (4 of 4)]
[im 1/33]
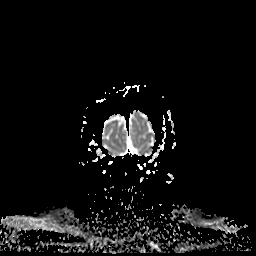
[im 11/33]
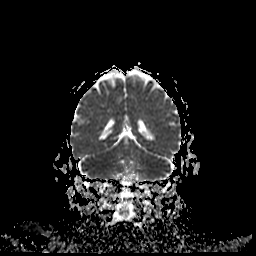
[im 22/33]
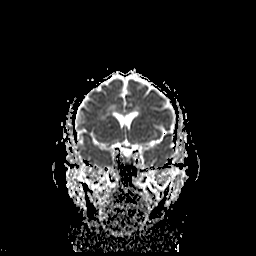
[im 33/33]
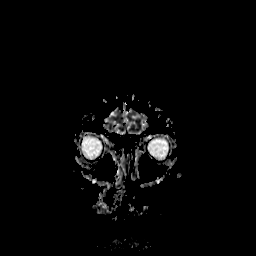

[34 of 48 positions shown; findings below may reference images not displayed]

FINDINGS: The CSF containing spaces are within normal limits for patient age.
Patchy T2/FLAIR hyperintensity within the periventricular and
supratentorial white matter noted, nonspecific, but likely related
to moderate chronic microvascular ischemic disease. Demyelinating
disease could also be considered.

No mass lesion, midline shift, or extra-axial fluid collection.
Ventricles are normal in size without evidence of hydrocephalus.

No diffusion-weighted signal abnormality is identified to suggest
acute intracranial infarct. Gray-white matter differentiation is
maintained. Normal flow voids are seen within the intracranial
vasculature. No intracranial hemorrhage identified. Probable remote
tiny lacunar infarct noted within the right thalamus.

Mild cerebellar tonsillar ectopia of approximately 5 mm noted.
Pituitary gland is within normal limits. Pituitary stalk is midline.
The globes and optic nerves demonstrate a normal appearance with
normal signal intensity.

1.3 cm T1 hyperintense well-circumscribed lesion within the left
parietal calvarium noted, likely benign in nature. Bone marrow
signal intensity is otherwise unremarkable. Visualized upper
cervical spine is within normal limits.

Scalp soft tissues are unremarkable.

Paranasal sinuses are clear.  No mastoid effusion.
IMPRESSION: 1. No acute intracranial infarct or other abnormality identified.
2. Patchy in T2/FLAIR hyperintensity within the periventricular and
deep white matter of both cerebral hemispheres, nonspecific.
Findings may be related to chronic microvascular ischemic disease.
Demyelinating disease could also be considered in the correct
clinical setting.
3. Subcentimeter remote lacunar infarct within the right thalamus.
4. Cerebellar tonsillar ectopia of approximately 4-5 mm.

## 2016-02-03 IMAGING — CT CT HEAD W/O CM
1 of 2 series · 16 of 30 positions shown, 20 images · non-contrast
Comparison: Report of brain MRI dated 01/30/2002 and 11/20/2001

CLINICAL DATA: Numbness and tingling in the left arm. Headache.
Vertigo.

EXAM:
CT HEAD WITHOUT CONTRAST
TECHNIQUE: Contiguous axial images were obtained from the base of the skull
through the vertex without intravenous contrast.

[Series 4: head 5.0 h30s · axial · 0.44mm/px · z∈[-132,-2]mm · 16 of 30 slices shown, 20 images]
[im 2/30  brain]
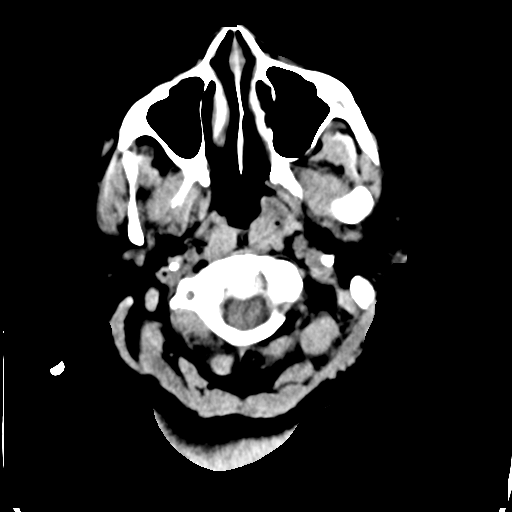
[im 2/30  bone]
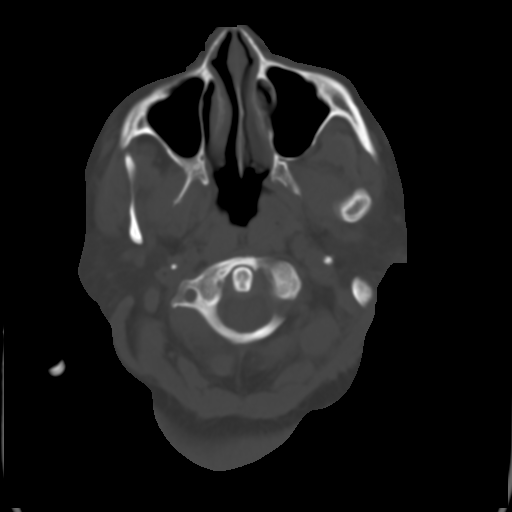
[im 3/30  brain]
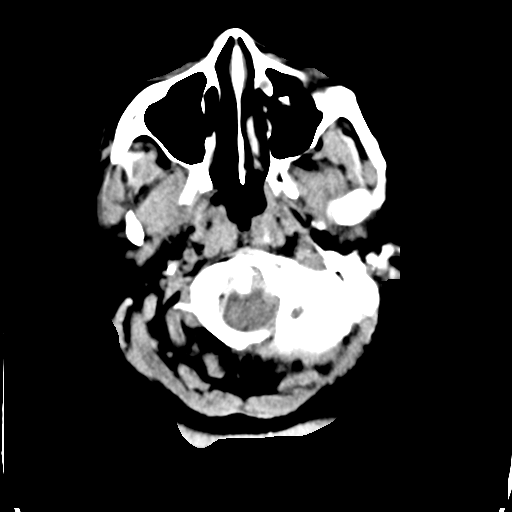
[im 6/30  brain]
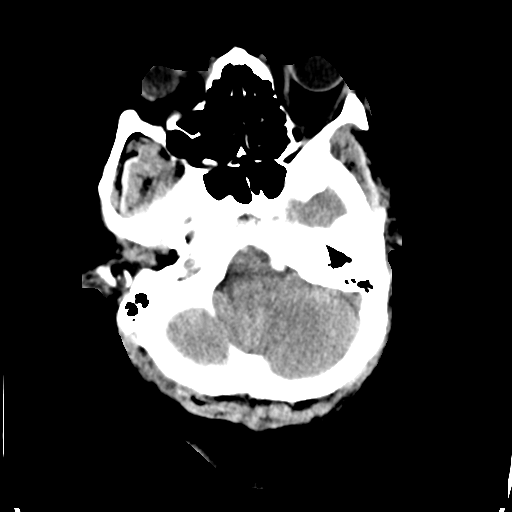
[im 7/30  brain]
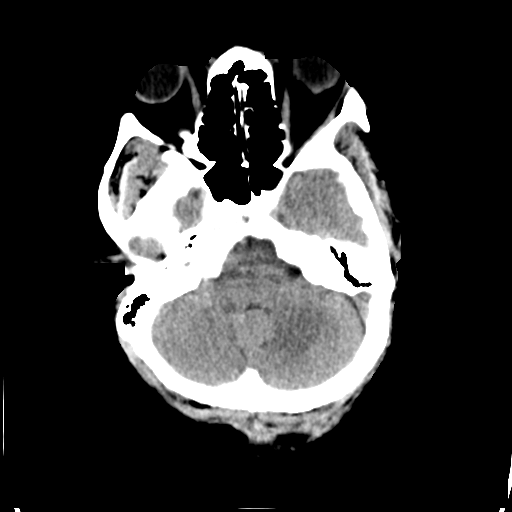
[im 9/30  brain]
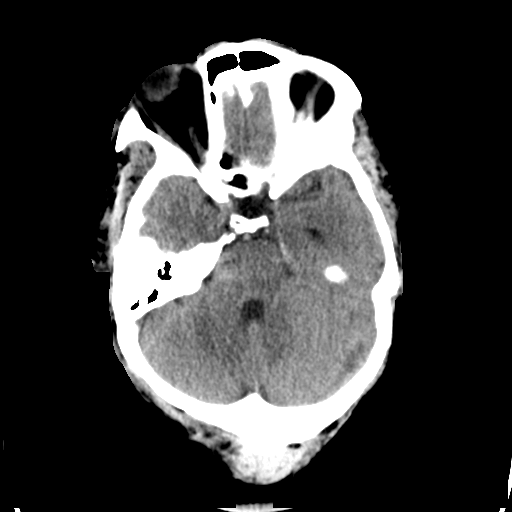
[im 9/30  bone]
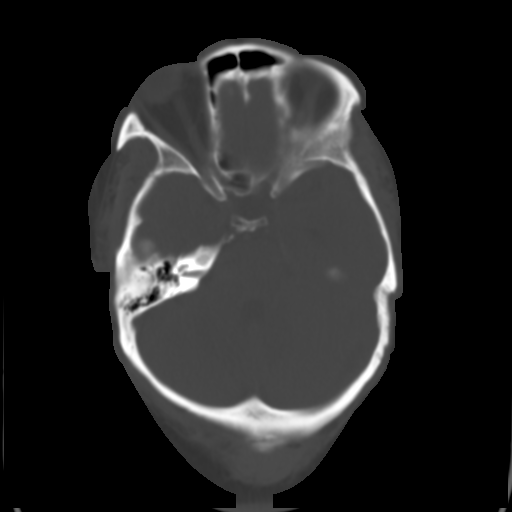
[im 10/30  brain]
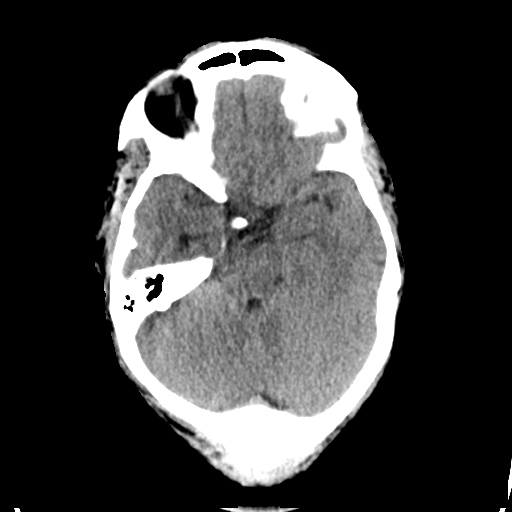
[im 13/30  brain]
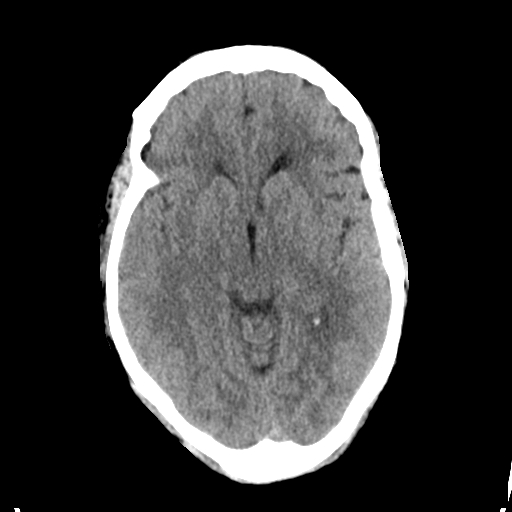
[im 14/30  brain]
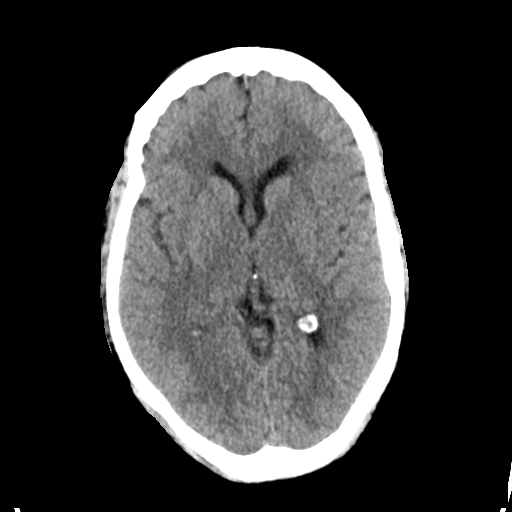
[im 16/30  brain]
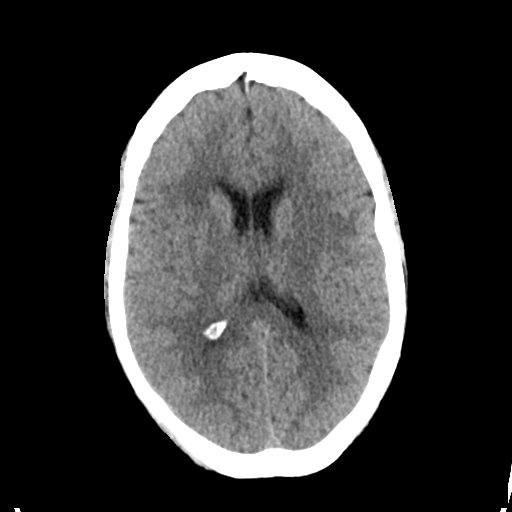
[im 16/30  bone]
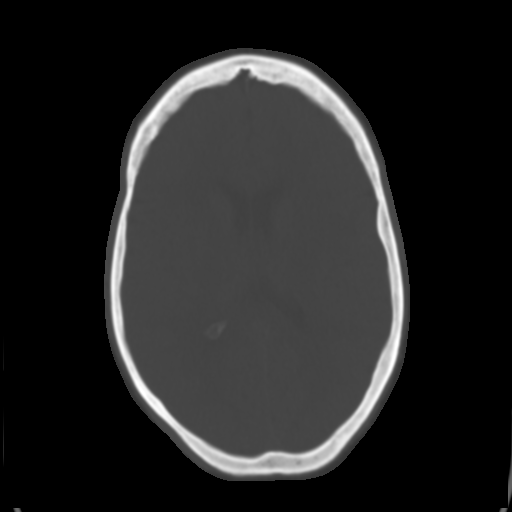
[im 17/30  brain]
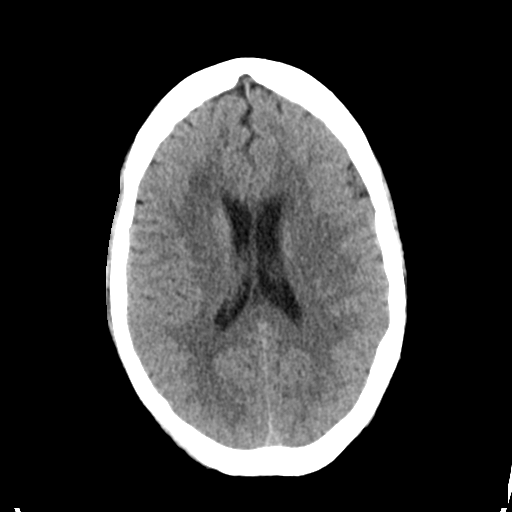
[im 20/30  brain]
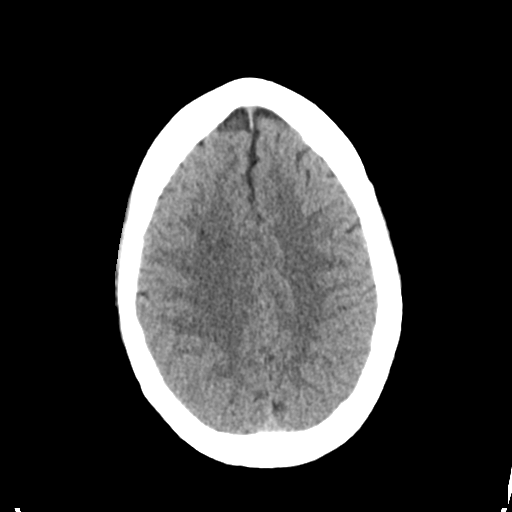
[im 21/30  brain]
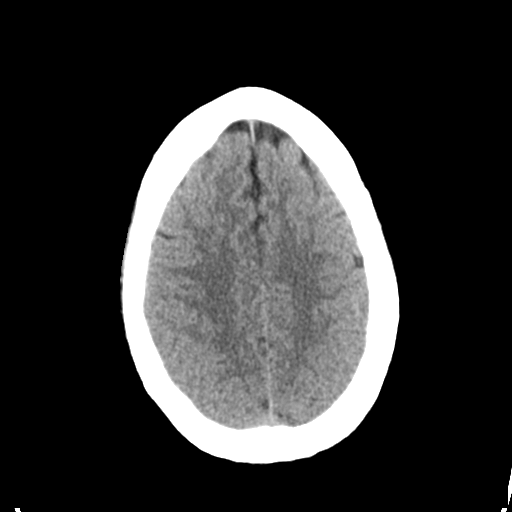
[im 23/30  brain]
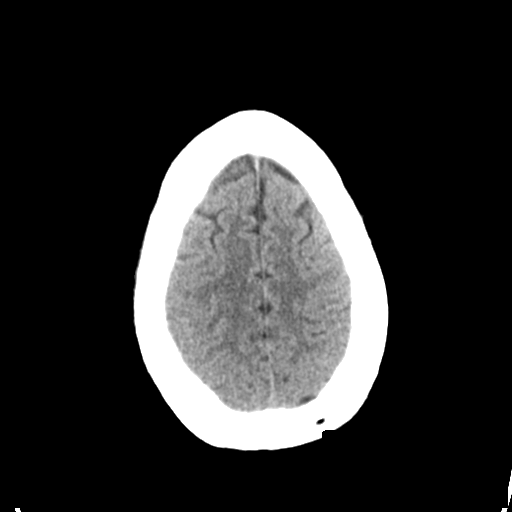
[im 23/30  bone]
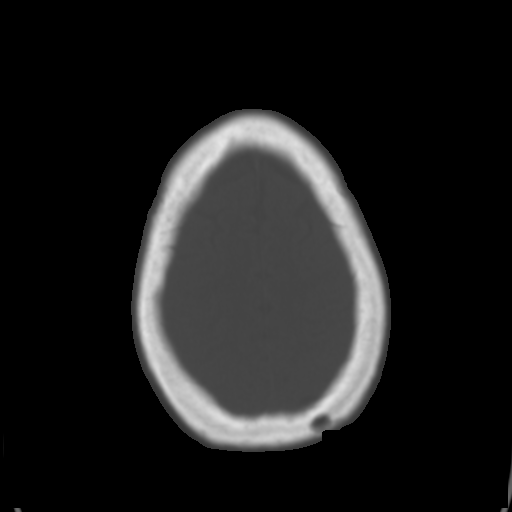
[im 24/30  brain]
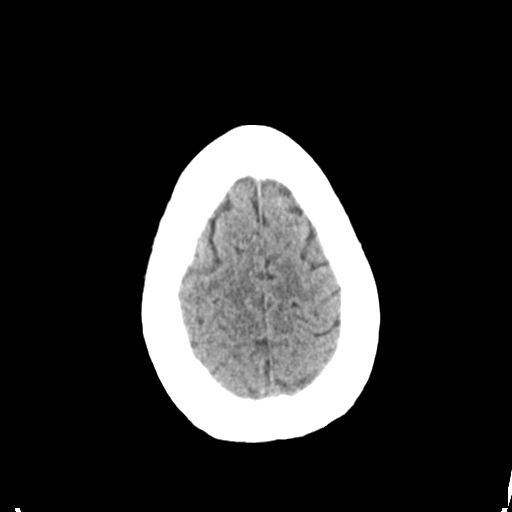
[im 27/30  brain]
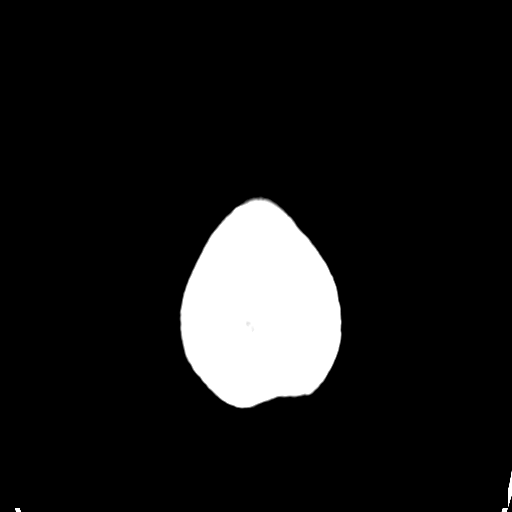
[im 28/30  brain]
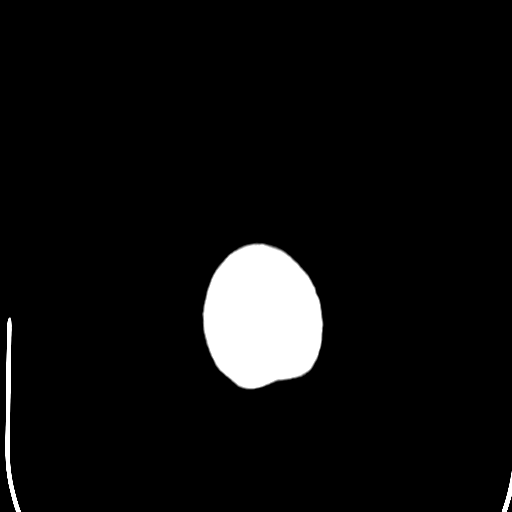

[16 of 30 positions shown; findings below may reference images not displayed]

FINDINGS: There is no acute intracranial hemorrhage, infarction, or mass
lesion. There is a tiny area of subcortical white matter lucency in
the right posterior frontal region consistent with small vessel
ischemic disease.

There is a 10 mm lytic lesion in the left posterior parietal bone
which was described on the prior MRIs. This probably represents a
benign calvarial hemangioma. No other is abnormalities.
IMPRESSION: No acute intracranial abnormality. Small focal area of lucency high
in the right posterior frontal region, probably small vessel
ischemic disease.

## 2016-02-08 MED FILL — LIDOCAINE-PRILOCAINE CREAM: 2.5-2.5 | 30 days supply | Qty: 30 | Fill #1

## 2016-02-09 ENCOUNTER — Ambulatory Visit (AMBULATORY_SURGERY_CENTER): Payer: PPO | Admitting: Internal Medicine

## 2016-02-09 ENCOUNTER — Encounter: Payer: Self-pay | Admitting: Internal Medicine

## 2016-02-09 VITALS — BP 168/83 | HR 72 | Temp 97.6°F | Resp 21 | Ht 62.0 in | Wt 215.0 lb

## 2016-02-09 DIAGNOSIS — Z1211 Encounter for screening for malignant neoplasm of colon: Secondary | ICD-10-CM

## 2016-02-09 DIAGNOSIS — I509 Heart failure, unspecified: Secondary | ICD-10-CM | POA: Diagnosis not present

## 2016-02-09 LAB — GLUCOSE, CAPILLARY
GLUCOSE-CAPILLARY: 194 mg/dL — AB (ref 65–99)
Glucose-Capillary: 226 mg/dL — ABNORMAL HIGH (ref 65–99)

## 2016-02-09 NOTE — Patient Instructions (Addendum)
No polyps or cancer seen!  You do have diverticulosis - thickened muscle rings and pouches in the colon wall. Please read the handout about this condition.  I appreciate the opportunity to care for you. Gatha Mayer, MD, FACG   YOU HAD AN ENDOSCOPIC PROCEDURE TODAY AT [facility]:   Refer to the procedure report that was given to you for any specific questions about what was found during the examination.  If the procedure report does not answer your questions, please call your gastroenterologist to clarify.  If you requested that your care partner not be given the details of your procedure findings, then the procedure report has been included in a sealed envelope for you to review at your convenience later.  YOU SHOULD EXPECT: Some feelings of bloating in the abdomen. Passage of more gas than usual.  Walking can help get rid of the air that was put into your GI tract during the procedure and reduce the bloating. If you had a lower endoscopy (such as a colonoscopy or flexible sigmoidoscopy) you may notice spotting of blood in your stool or on the toilet paper. If you underwent a bowel prep for your procedure, you may not have a normal bowel movement for a few days.  Please Note:  You might notice some irritation and congestion in your nose or some drainage.  This is from the oxygen used during your procedure.  There is no need for concern and it should clear up in a day or so.  SYMPTOMS TO REPORT IMMEDIATELY:   Following lower endoscopy (colonoscopy or flexible sigmoidoscopy):  Excessive amounts of blood in the stool  Significant tenderness or worsening of abdominal pains  Swelling of the abdomen that is new, acute  Fever of 100F or higher    For urgent or emergent issues, a gastroenterologist can be reached at any hour by calling [phone].   DIET: Your first meal following the procedure should be a small meal and then it is ok to progress to your normal  diet. Heavy or fried foods are harder to digest and may make you feel nauseous or bloated.  Likewise, meals heavy in dairy and vegetables can increase bloating.  Drink plenty of fluids but you should avoid alcoholic beverages for 24 hours.  ACTIVITY:  You should plan to take it easy for the rest of today and you should NOT DRIVE or use heavy machinery until tomorrow (because of the sedation medicines used during the test).    FOLLOW UP: Our staff will call the number listed on your records the next business day following your procedure to check on you and address any questions or concerns that you may have regarding the information given to you following your procedure. If we do not reach you, we will leave a message.  However, if you are feeling well and you are not experiencing any problems, there is no need to return our call.  We will assume that you have returned to your regular daily activities without incident.  If any biopsies were taken you will be contacted by phone or by letter within the next 1-3 weeks.  Please call us at [phone] if you have not heard about the biopsies in 3 weeks.    SIGNATURES/CONFIDENTIALITY: You and/or your care partner have signed paperwork which will be entered into your electronic medical record.  These signatures attest to the fact that that the information above on your After Visit Summary has been  reviewed and is understood.  Full responsibility of the confidentiality of this discharge information lies with you and/or your care-partner.   Information on diverticulosis given to you today

## 2016-02-09 NOTE — Progress Notes (Signed)
A/ox3 pleased with MAC, report to Penny RN 

## 2016-02-09 NOTE — Op Note (Signed)
[facility] Patient Name: Kathy Maxwell Procedure Date: 02/09/2016 8:38 AM MRN: [id number] Endoscopist: Gatha Mayer , MD Age: 60 Referring MD:  Date of Birth: 02/17/1956 Gender: Female Account #: [id number] Procedure:                Colonoscopy Indications:              Screening for colorectal malignant neoplasm Medicines:                Propofol per Anesthesia, Monitored Anesthesia Care Procedure:                Pre-Anesthesia Assessment:                           - Prior to the procedure, a History and Physical                            was performed, and patient medications and                            allergies were reviewed. The patient's tolerance of                            previous anesthesia was also reviewed. The risks                            and benefits of the procedure and the sedation                            options and risks were discussed with the patient.                            All questions were answered, and informed consent                            was obtained. Prior Anticoagulants: The patient has                            taken no previous anticoagulant or antiplatelet                            agents. ASA Grade Assessment: III - A patient with                            severe systemic disease. After reviewing the risks                            and benefits, the patient was deemed in                            satisfactory condition to undergo the procedure.                           After obtaining informed consent, the colonoscope  was passed under direct vision. Throughout the                            procedure, the patient's blood pressure, pulse, and                            oxygen saturations were monitored continuously. The                            Model CF-HQ190L [id number]) scope was introduced                            through the anus and advanced to the the cecum,          identified by appendiceal orifice and ileocecal                            valve. The ileocecal valve, appendiceal orifice,                            and rectum were photographed. The quality of the                            bowel preparation was good. The bowel preparation                            used was Miralax. Scope In: 8:51:36 AM Scope Out: 9:05:38 AM Scope Withdrawal Time: 0 hours 10 minutes 15 seconds  Total Procedure Duration: 0 hours 14 minutes 2 seconds  Findings:                 The perianal and digital rectal examinations were                            normal.                           The entire examined colon appeared normal on direct                            and retroflexion views. Complications:            No immediate complications. Estimated blood loss:                            None. Estimated Blood Loss:     Estimated blood loss: none. Impression:               - No specimens collected.                           - The entire examined colon is normal.                           - The distal rectum and anal verge are normal on  retroflexion view. Recommendation:           - Repeat colonoscopy in 10 years for screening                            purposes.                           - Resume previous diet.                           - Continue present medications.                           - Patient has a contact number available for                            emergencies. The signs and symptoms of potential                            delayed complications were discussed with the                            patient. Return to normal activities tomorrow.                            Written discharge instructions were provided to the                            patient. Gatha Mayer, MD 02/09/2016 9:13:43 AM This report has been signed electronically. CC Letter to:             Glendale Chard, Virgie Dad. Magrinat

## 2016-02-10 ENCOUNTER — Telehealth: Payer: Self-pay | Admitting: *Deleted

## 2016-02-10 NOTE — Telephone Encounter (Signed)
  Follow up Call-  Call back number 02/09/2016  Post procedure Call Back phone  # [phone]  Permission to leave phone message Yes  Some recent data might be hidden     Patient questions:  Do you have a fever, pain , or abdominal swelling? No. Pain Score  0 *  Have you tolerated food without any problems? Yes.    Have you been able to return to your normal activities? Yes.    Do you have any questions about your discharge instructions: Diet   No. Medications  No. Follow up visit  No.  Do you have questions or concerns about your Care? No.  Actions: * If pain score is 4 or above: No action needed, pain <4.

## 2016-02-11 DIAGNOSIS — I10 Essential (primary) hypertension: Secondary | ICD-10-CM | POA: Diagnosis not present

## 2016-02-11 DIAGNOSIS — E1165 Type 2 diabetes mellitus with hyperglycemia: Secondary | ICD-10-CM | POA: Diagnosis not present

## 2016-02-12 ENCOUNTER — Ambulatory Visit (HOSPITAL_BASED_OUTPATIENT_CLINIC_OR_DEPARTMENT_OTHER): Payer: PPO

## 2016-02-12 VITALS — BP 166/64 | HR 70 | Temp 98.8°F | Resp 20

## 2016-02-12 DIAGNOSIS — D0511 Intraductal carcinoma in situ of right breast: Secondary | ICD-10-CM

## 2016-02-12 DIAGNOSIS — C50412 Malignant neoplasm of upper-outer quadrant of left female breast: Secondary | ICD-10-CM

## 2016-02-12 DIAGNOSIS — C773 Secondary and unspecified malignant neoplasm of axilla and upper limb lymph nodes: Secondary | ICD-10-CM | POA: Diagnosis not present

## 2016-02-12 DIAGNOSIS — Z452 Encounter for adjustment and management of vascular access device: Secondary | ICD-10-CM

## 2016-02-12 DIAGNOSIS — Z95828 Presence of other vascular implants and grafts: Secondary | ICD-10-CM

## 2016-02-12 MED ORDER — SODIUM CHLORIDE 0.9% FLUSH
10.0000 mL | INTRAVENOUS | Status: DC | PRN
  Administered 2016-02-12: 10 mL via INTRAVENOUS
  Filled 2016-02-12: qty 10

## 2016-02-12 MED ORDER — HEPARIN SOD (PORK) LOCK FLUSH 100 UNIT/ML IV SOLN
500.0000 [IU] | Freq: Once | INTRAVENOUS | Status: AC
  Administered 2016-02-12: 500 [IU] via INTRAVENOUS
  Filled 2016-02-12: qty 5

## 2016-04-08 ENCOUNTER — Ambulatory Visit (HOSPITAL_BASED_OUTPATIENT_CLINIC_OR_DEPARTMENT_OTHER): Payer: PPO

## 2016-04-08 DIAGNOSIS — C773 Secondary and unspecified malignant neoplasm of axilla and upper limb lymph nodes: Secondary | ICD-10-CM | POA: Diagnosis not present

## 2016-04-08 DIAGNOSIS — D0511 Intraductal carcinoma in situ of right breast: Secondary | ICD-10-CM | POA: Diagnosis not present

## 2016-04-08 DIAGNOSIS — C50412 Malignant neoplasm of upper-outer quadrant of left female breast: Secondary | ICD-10-CM

## 2016-04-08 DIAGNOSIS — Z452 Encounter for adjustment and management of vascular access device: Secondary | ICD-10-CM

## 2016-04-08 DIAGNOSIS — Z95828 Presence of other vascular implants and grafts: Secondary | ICD-10-CM

## 2016-04-08 MED ORDER — HEPARIN SOD (PORK) LOCK FLUSH 100 UNIT/ML IV SOLN
500.0000 [IU] | INTRAVENOUS | Status: AC | PRN
  Administered 2016-04-08: 500 [IU]
  Filled 2016-04-08: qty 5

## 2016-04-08 MED ORDER — SODIUM CHLORIDE 0.9 % IJ SOLN
10.0000 mL | INTRAMUSCULAR | Status: AC | PRN
  Administered 2016-04-08: 10 mL
  Filled 2016-04-08: qty 10

## 2016-04-08 MED FILL — GABAPENTIN 100 MG CAPSULE: 100 | 30 days supply | Qty: 270 | Fill #1

## 2016-04-08 NOTE — Patient Instructions (Signed)

## 2016-04-13 DIAGNOSIS — E109 Type 1 diabetes mellitus without complications: Secondary | ICD-10-CM | POA: Diagnosis not present

## 2016-04-23 DIAGNOSIS — E109 Type 1 diabetes mellitus without complications: Secondary | ICD-10-CM | POA: Diagnosis not present

## 2016-05-04 MED FILL — LOSARTAN POTASSIUM 100 MG T: 100 | 30 days supply | Qty: 30 | Fill #3

## 2016-05-04 MED FILL — ANASTROZOLE 1 MG TABLET: 1 | 90 days supply | Qty: 90 | Fill #2

## 2016-05-05 ENCOUNTER — Encounter: Payer: Self-pay | Admitting: Oncology

## 2016-05-05 NOTE — Progress Notes (Signed)
Re enrolled pt w/ the [facility] [employer] primarily for Anastrozole.  Pt is approved for 12 months from 05/05/16 for a guaranteed award of [phone] and the remaining award balance of $3,350 is accessible on a [employer] basis as long as funding remains available for her Dx.  I mailed a copy of approval letter to pt today and forwarded a copy to HIM to be scanned in pt's chart.

## 2016-05-06 DIAGNOSIS — Z9181 History of falling: Secondary | ICD-10-CM | POA: Diagnosis not present

## 2016-05-06 DIAGNOSIS — I10 Essential (primary) hypertension: Secondary | ICD-10-CM | POA: Diagnosis not present

## 2016-05-06 DIAGNOSIS — E1165 Type 2 diabetes mellitus with hyperglycemia: Secondary | ICD-10-CM | POA: Diagnosis not present

## 2016-05-06 DIAGNOSIS — Z853 Personal history of malignant neoplasm of breast: Secondary | ICD-10-CM | POA: Diagnosis not present

## 2016-05-06 DIAGNOSIS — Z0000 Encounter for general adult medical examination without abnormal findings: Secondary | ICD-10-CM | POA: Diagnosis not present

## 2016-05-06 DIAGNOSIS — Z87891 Personal history of nicotine dependence: Secondary | ICD-10-CM | POA: Diagnosis not present

## 2016-05-06 DIAGNOSIS — Z79899 Other long term (current) drug therapy: Secondary | ICD-10-CM | POA: Diagnosis not present

## 2016-05-06 DIAGNOSIS — Z23 Encounter for immunization: Secondary | ICD-10-CM | POA: Diagnosis not present

## 2016-07-19 ENCOUNTER — Other Ambulatory Visit: Payer: Self-pay | Admitting: Internal Medicine

## 2016-07-19 DIAGNOSIS — Z9012 Acquired absence of left breast and nipple: Secondary | ICD-10-CM

## 2016-07-19 DIAGNOSIS — Z853 Personal history of malignant neoplasm of breast: Secondary | ICD-10-CM

## 2016-07-23 DIAGNOSIS — E109 Type 1 diabetes mellitus without complications: Secondary | ICD-10-CM | POA: Diagnosis not present

## 2016-08-12 ENCOUNTER — Ambulatory Visit
Admission: RE | Admit: 2016-08-12 | Discharge: 2016-08-12 | Disposition: A | Discharge status: Home / Self Care | Payer: PPO | Source: Ambulatory Visit | Attending: Internal Medicine | Admitting: Internal Medicine

## 2016-08-12 DIAGNOSIS — Z853 Personal history of malignant neoplasm of breast: Secondary | ICD-10-CM

## 2016-08-12 DIAGNOSIS — Z9012 Acquired absence of left breast and nipple: Secondary | ICD-10-CM

## 2016-08-12 DIAGNOSIS — C50912 Malignant neoplasm of unspecified site of left female breast: Secondary | ICD-10-CM | POA: Diagnosis not present

## 2016-08-12 DIAGNOSIS — R928 Other abnormal and inconclusive findings on diagnostic imaging of breast: Secondary | ICD-10-CM | POA: Diagnosis not present

## 2016-08-12 DIAGNOSIS — C50911 Malignant neoplasm of unspecified site of right female breast: Secondary | ICD-10-CM | POA: Diagnosis not present

## 2016-08-12 MED FILL — METOPROLOL SUCC ER 25 MG TA: 25 | 90 days supply | Qty: 90 | Fill #0

## 2016-08-12 MED FILL — GABAPENTIN 100 MG CAPSULE: 100 | 30 days supply | Qty: 270 | Fill #2

## 2016-08-12 MED FILL — ANASTROZOLE 1 MG TABLET: 1 | 90 days supply | Qty: 90 | Fill #3

## 2016-08-12 MED FILL — LOSARTAN POTASSIUM 100 MG T: 100 | 30 days supply | Qty: 30 | Fill #0

## 2016-08-31 DIAGNOSIS — Z7984 Long term (current) use of oral hypoglycemic drugs: Secondary | ICD-10-CM | POA: Diagnosis not present

## 2016-08-31 DIAGNOSIS — H524 Presbyopia: Secondary | ICD-10-CM | POA: Diagnosis not present

## 2016-08-31 DIAGNOSIS — H17823 Peripheral opacity of cornea, bilateral: Secondary | ICD-10-CM | POA: Diagnosis not present

## 2016-08-31 DIAGNOSIS — Z961 Presence of intraocular lens: Secondary | ICD-10-CM | POA: Diagnosis not present

## 2016-08-31 DIAGNOSIS — E119 Type 2 diabetes mellitus without complications: Secondary | ICD-10-CM | POA: Diagnosis not present

## 2016-09-05 ENCOUNTER — Other Ambulatory Visit: Payer: Self-pay | Admitting: *Deleted

## 2016-09-05 DIAGNOSIS — C50412 Malignant neoplasm of upper-outer quadrant of left female breast: Secondary | ICD-10-CM

## 2016-09-06 ENCOUNTER — Ambulatory Visit: Payer: PPO

## 2016-09-06 ENCOUNTER — Other Ambulatory Visit (HOSPITAL_BASED_OUTPATIENT_CLINIC_OR_DEPARTMENT_OTHER): Payer: PPO

## 2016-09-06 DIAGNOSIS — D0511 Intraductal carcinoma in situ of right breast: Secondary | ICD-10-CM | POA: Diagnosis not present

## 2016-09-06 DIAGNOSIS — C50412 Malignant neoplasm of upper-outer quadrant of left female breast: Secondary | ICD-10-CM | POA: Diagnosis not present

## 2016-09-06 DIAGNOSIS — Z95828 Presence of other vascular implants and grafts: Secondary | ICD-10-CM

## 2016-09-06 DIAGNOSIS — C773 Secondary and unspecified malignant neoplasm of axilla and upper limb lymph nodes: Secondary | ICD-10-CM | POA: Diagnosis not present

## 2016-09-06 LAB — COMPREHENSIVE METABOLIC PANEL
ALT: 16 U/L (ref 0–55)
AST: 12 U/L (ref 5–34)
Albumin: 3.5 g/dL (ref 3.5–5.0)
Alkaline Phosphatase: 146 U/L (ref 40–150)
Anion Gap: 10 mEq/L (ref 3–11)
BUN: 15.7 mg/dL (ref 7.0–26.0)
CHLORIDE: 102 meq/L (ref 98–109)
CO2: 24 meq/L (ref 22–29)
CREATININE: 0.9 mg/dL (ref 0.6–1.1)
Calcium: 9.7 mg/dL (ref 8.4–10.4)
EGFR: 76 mL/min/{1.73_m2} — AB (ref >90)
Glucose: 286 mg/dl — ABNORMAL HIGH (ref 70–140)
Potassium: 4.1 mEq/L (ref 3.5–5.1)
Sodium: 136 mEq/L (ref 136–145)
Total Bilirubin: 0.58 mg/dL (ref 0.20–1.20)
Total Protein: 7.1 g/dL (ref 6.4–8.3)

## 2016-09-06 LAB — CBC WITH DIFFERENTIAL/PLATELET
BASO%: 1.3 % (ref 0.0–2.0)
Basophils Absolute: 0.1 10*3/uL (ref 0.0–0.1)
EOS%: 2.8 % (ref 0.0–7.0)
Eosinophils Absolute: 0.1 10*3/uL (ref 0.0–0.5)
HCT: 37.1 % (ref 34.8–46.6)
HEMOGLOBIN: 12.5 g/dL (ref 11.6–15.9)
LYMPH%: 28.8 % (ref 14.0–49.7)
MCH: 30.3 pg (ref 25.1–34.0)
MCHC: 33.7 g/dL (ref 31.5–36.0)
MCV: 90 fL (ref 79.5–101.0)
MONO#: 0.4 10*3/uL (ref 0.1–0.9)
MONO%: 8 % (ref 0.0–14.0)
NEUT%: 59.1 % (ref 38.4–76.8)
NEUTROS ABS: 3 10*3/uL (ref 1.5–6.5)
PLATELETS: 262 10*3/uL (ref 145–400)
RBC: 4.13 10*6/uL (ref 3.70–5.45)
RDW: 12.8 % (ref 11.2–14.5)
WBC: 5.1 10*3/uL (ref 3.9–10.3)
lymph#: 1.5 10*3/uL (ref 0.9–3.3)

## 2016-09-06 MED ORDER — SODIUM CHLORIDE 0.9 % IJ SOLN
10.0000 mL | INTRAMUSCULAR | Status: AC | PRN
  Administered 2016-09-06: 10 mL
  Filled 2016-09-06: qty 10

## 2016-09-06 MED ORDER — HEPARIN SOD (PORK) LOCK FLUSH 100 UNIT/ML IV SOLN
500.0000 [IU] | INTRAVENOUS | Status: DC | PRN
  Administered 2016-09-06: 500 [IU]
  Filled 2016-09-06: qty 5

## 2016-09-07 DIAGNOSIS — E1165 Type 2 diabetes mellitus with hyperglycemia: Secondary | ICD-10-CM | POA: Diagnosis not present

## 2016-09-07 DIAGNOSIS — J00 Acute nasopharyngitis [common cold]: Secondary | ICD-10-CM | POA: Diagnosis not present

## 2016-09-07 DIAGNOSIS — E784 Other hyperlipidemia: Secondary | ICD-10-CM | POA: Diagnosis not present

## 2016-09-07 DIAGNOSIS — I10 Essential (primary) hypertension: Secondary | ICD-10-CM | POA: Diagnosis not present

## 2016-09-12 MED FILL — LOSARTAN POTASSIUM 100 MG T: 100 | 30 days supply | Qty: 30 | Fill #1

## 2016-09-13 ENCOUNTER — Ambulatory Visit (HOSPITAL_BASED_OUTPATIENT_CLINIC_OR_DEPARTMENT_OTHER): Payer: PPO | Admitting: Oncology

## 2016-09-13 VITALS — BP 148/57 | HR 70 | Temp 98.2°F | Resp 18 | Ht 62.0 in | Wt 205.4 lb

## 2016-09-13 DIAGNOSIS — D0511 Intraductal carcinoma in situ of right breast: Secondary | ICD-10-CM | POA: Diagnosis not present

## 2016-09-13 DIAGNOSIS — Z170 Estrogen receptor positive status [ER+]: Secondary | ICD-10-CM

## 2016-09-13 DIAGNOSIS — C773 Secondary and unspecified malignant neoplasm of axilla and upper limb lymph nodes: Secondary | ICD-10-CM

## 2016-09-13 DIAGNOSIS — M8580 Other specified disorders of bone density and structure, unspecified site: Secondary | ICD-10-CM

## 2016-09-13 DIAGNOSIS — E119 Type 2 diabetes mellitus without complications: Secondary | ICD-10-CM

## 2016-09-13 DIAGNOSIS — C50412 Malignant neoplasm of upper-outer quadrant of left female breast: Secondary | ICD-10-CM

## 2016-09-13 MED ORDER — VITAMIN D 1000 UNITS PO TABS: 1000.0000 [IU] | ORAL_TABLET | Freq: Every day | ORAL | 4 refills | Status: DC

## 2016-09-13 MED ORDER — ANASTROZOLE 1 MG PO TABS: 1.0000 mg | ORAL_TABLET | Freq: Every day | ORAL | 4 refills | Status: DC

## 2016-09-13 NOTE — Progress Notes (Signed)
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 female. With other medical problems who underwent a screening mammogram that led to a diagnostic mammogram and ultrasound of the left breast. The mammogram and ultrasound showed a 2.9 x 1.6 x 1.9 cm suspicious mass in the left breast at the 12:30 oh clock position 6 cm from the nipple. There was also a suspicious left axillary lymph node. She had image guided biopsy of the left breast mass and the lymph node. The pathology revealed an invasive mammary carcinoma probably ductal phenotype. Tumor was estrogen receptor +100% progesterone receptor +98% proliferation marker Ki-67 13% and HER-2/neu negative. Patient had MRI of the breasts performed on 03/15/2013 the left breast revealed a 7.4 cm area of what the lymph node. Right breast revealed and a 3.3 cm mass. Patient was seen by Dr. Fanny Maxwell for discussion of surgical options. He has referred the patient to medical oncology for discussion whether or not she will need a Port-A-Cath and chemotherapy."  The patient underwent left modified radical mastectomy 04/22/2014 showing multiple foci of low-grade invasive ductal carcinoma, the largest single lesion measuring 5.5 cm, and involving 4/22 lymph nodes. Repeat HER-2 study was again negative. 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 The patient proceeded to adjuvant chemotherapy completed May 2015 as summarized below. She is currently receiving radiation therapy.  The patient's subsequent history is detailed below.  INTERVAL HISTORY: Kathy Maxwell returns today for follow-up of her estrogen receptor positive breast cancer. She continues on anastrozole, with good tolerance. She denies significant problems from hot flashes or night sweats. She does not have arthralgias or myalgias related to this medication although she does have significant arthritis problems at baseline. She obtains a drug at a very good price.   REVIEW OF SYSTEMS: Kathy Maxwell does "her usual routine". Currently this involves helping 1 of her siblings who recently went through a difficult medical problem. She has a little dry cough at times which she attributes to seasonal allergies. She has rare headaches. He describes herself as anxious and depressed. She tells me her diabetes is well-controlled. A detailed review of systems today was otherwise stable.  PAST MEDICAL HISTORY: Past Medical History:  Diagnosis Date  . Allergy    latex  . Arthritis   . Bipolar 1 disorder ([place])   . Bipolar 1 disorder ([place]) 04/04/2013  . Blood transfusion without reported diagnosis    2014 during chemotherapy  . Breast cancer ([place]) 03/10/13   left breast invasive ca  . Cancer ([place])   . Coronary artery disease   . Diabetes mellitus without complication (H[facility])    NIDDM  . GERD (gastroesophageal reflux disease)   . Heart murmur   . Hyperlipidemia   . Hyperlipidemia 04/04/2013  . Hypertension   . Hypertension 04/04/2013  .  Obesity   . Radiation 01/30/14-03/19/14  . Sleep apnea    no cpap due to insurance    PAST SURGICAL HISTORY: Past Surgical History:  Procedure Laterality Date  . ABDOMINAL HYSTERECTOMY    . BREAST  BIOPSY Left 05/08/09   fibroadenoma with microcalcifications,no malignancy id  . BREAST BIOPSY Left 03/07/13   invasive ca,1 lymph node metastatic  . 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 BREAST LUMPECTOMY WITH NEEDLE LOCALIZATION AND AXILLARY SENTINEL LYMPH NODE BX Right 04/22/2013   Procedure: BREAST LUMPECTOMY WITH NEEDLE LOCALIZATION AND AXILLARY SENTINEL LYMPH NODE BX;  Surgeon: Kathy Hector, MD;  Location: [facility];  Service: General;  Laterality: Right;  . CARDIAC CATHETERIZATION    . CHOLECYSTECTOMY    . CORONARY ANGIOPLASTY     LAD stent 2003  . CORONARY STENT PLACEMENT  2001  . ESOPHAGOGASTRODUODENOSCOPY N/A 08/07/2013   Procedure: ESOPHAGOGASTRODUODENOSCOPY (EGD);  Surgeon: Kathy Sabins, MD;  Location: [facility];  Service: Endoscopy;  Laterality: N/A;  . ESOPHAGOGASTRODUODENOSCOPY N/A 11/13/2013   Procedure: ESOPHAGOGASTRODUODENOSCOPY (EGD);  Surgeon: Kathy Horner, MD;  Location: [facility] ENDOSCOPY;  Service: Endoscopy;  Laterality: N/A;  . EYE SURGERY  6/13,9/14   laser,cataract lft  . MASTECTOMY MODIFIED RADICAL Left 04/22/2013   Procedure: MASTECTOMY MODIFIED RADICAL;  Surgeon: Kathy Hector, MD;  Location: [facility];  Service: General;  Laterality: Left;  . PORTACATH PLACEMENT Right 04/22/2013   Procedure: INSERTION PORT-A-CATH;  Surgeon: Kathy Hector, MD;  Location: [facility];  Service: General;  Laterality: Right;    FAMILY HISTORY Family History  Problem Relation Age of Onset  . Heart disease Mother   . Breast cancer Maternal Aunt     dx in her 3s  . Heart disease Cousin 81    maternal cousin  . Heart attack Sister 71  . Cancer Paternal Aunt     NOS  . Cancer Maternal Aunt     NOS  . Lung cancer Cousin 46    maternal cousin - non smoker  . Brain cancer Cousin 13    maternal cousin's son  . Lung cancer Cousin 47    maternal cousin's son  . Breast cancer Cousin     paternal cousin  . Colon cancer Neg Hx     the patient's father died from alcoholic cirrhosis at the age of 43. The patient's mother died from congestive heart failure the age of 84. The patient had no brothers, 4 sisters. One sister died from a myocardial infarction at the age of 47. The only other breast cancers in the family are a maternal aunt(Otto 69) with breast cancer diagnosed after the age of 55 and a second cousin on the mother's side, diagnosed with breast cancer approximately age 32. There is no history of ovarian cancer in the family. The patient has undergone genetic testing and his BRCA negative  GYNECOLOGIC HISTORY:  No LMP recorded. Patient is postmenopausal. Menarche age 73. The patient is GX P0. She underwent hysterectomy with bilateral salpingo-oophorectomy in 1997. She took hormone replacement briefly.  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 She is single. Currently she shares a home with a friend, but is looking for her own place. She attends a local church of [employer]: Not in place. At her 02/12/2014 visit the patient was given the appropriate documents to complete and notarize at her discretion. She tells me she  intends to name her goddaughter,, Gerlene Burdock, who is a CNA, as her healthcare power of attorney. Ms. Ky Barban can be reached at [place]: Social History  Substance Use Topics  . Smoking status: Former Smoker    Packs/day: 1.00    Years: 15.00    Types: Cigarettes    Quit date: 07/19/1987  . Smokeless tobacco: Never Used  . Alcohol use No     Colonoscopy: Never  PAP:  Bone density: Never  Lipid panel:  Allergies  Allergen Reactions  . Cephalexin Other (See Comments)    Burned throat.  . Adhesive [Tape] Rash    Clear tape gloves ,elastic no problem  . Latex Hives and Rash    Current Outpatient Prescriptions  Medication Sig Dispense Refill  . amLODipine (NORVASC) 10 MG tablet Take 10 mg by mouth  daily.    [name] anastrozole (ARIMIDEX) 1 MG tablet Take 1 tablet (1 mg total) by mouth daily. 90 tablet 4  . b complex vitamins tablet Take 1 tablet by mouth daily.    [name] buPROPion (WELLBUTRIN SR) 150 MG 12 hr tablet Take 1 tablet (150 mg total) by mouth daily. 30 tablet 6  . Cholecalciferol (VITAMIN D PO) Take 1 tablet by mouth daily.    . fluticasone (FLONASE) 50 MCG/ACT nasal spray Place 1 spray into both nostrils daily. (Patient not taking: Reported on 02/01/2016) 9.9 g 0  . gabapentin (NEURONTIN) 100 MG capsule TAKE 3 CAPSULES BY MOUTH THREE TIMES DAILY 270 capsule 6  . hydrALAZINE (APRESOLINE) 25 MG tablet Take 25 mg by mouth 3 (three) times daily.     [name] lidocaine-prilocaine (EMLA) cream Apply topically as needed. Apply to port-a-cath one - two hours before accessing. (Patient not taking: Reported on 02/01/2016) 30 g 3  . losartan (COZAAR) 100 MG tablet Take 1 tablet (100 mg total) by mouth daily. (Patient not taking: Reported on 02/09/2016) 90 tablet 0  . metoprolol succinate (TOPROL-XL) 25 MG 24 hr tablet Take 25 mg by mouth daily.     . naproxen sodium (ANAPROX) 220 MG tablet Take 220 mg by mouth daily as needed (headache.). Reported on 02/01/2016    . pantoprazole (PROTONIX) 40 MG tablet Take 1 tablet (40 mg total) by mouth daily. 30 tablet 3  . pioglitazone-metformin (ACTOPLUS MET) 15-850 MG per tablet Take 1 tablet by mouth every morning.     . sitaGLIPtin (JANUVIA) 100 MG tablet Take 100 mg by mouth daily.     No current facility-administered medications for this visit.    Facility-Administered Medications Ordered in Other Visits  Medication Dose Route Frequency Provider Last Rate Last Dose  . sodium chloride 0.9 % injection 10 mL  10 mL Intravenous PRN Marcy Panning, MD   10 mL at 05/24/13 1522    OBJECTIVE: Middle-aged woman who appears stated age  18:   09/13/16 1145  BP: (!) 148/57  Pulse: 70  Resp: 18  Temp: 98.2 F (36.8 C)     Body mass index is 37.57 kg/m.    ECOG FS:1  - Symptomatic but completely ambulatory  Sclerae unicteric, pupils round and equal Oropharynx clear and moist-- no thrush or other lesions No cervical or supraclavicular adenopathy Lungs no rales or rhonchi Heart regular rate and rhythm Abd soft, nontender, positive bowel sounds MSK no focal spinal tenderness, uses a cane while walking Neuro: nonfocal, well oriented, appropriate affect Breasts: The right breast is benign. The left breast is status post mastectomy with no evidence  of local recurrence. Both axillae are benign.    LAB RESULTS:  CMP     Component Value Date/Time   NA 136 09/06/2016 1114   K 4.1 09/06/2016 1114   CL 100 (L) 10/08/2015 2022   CO2 24 09/06/2016 1114   GLUCOSE 286 (H) 09/06/2016 1114   BUN 15.7 09/06/2016 1114   CREATININE 0.9 09/06/2016 1114   CALCIUM 9.7 09/06/2016 1114   PROT 7.1 09/06/2016 1114   ALBUMIN 3.5 09/06/2016 1114   AST 12 09/06/2016 1114   ALT 16 09/06/2016 1114   ALKPHOS 146 09/06/2016 1114   BILITOT 0.58 09/06/2016 1114   GFRNONAA 53 (L) 10/08/2015 2022   GFRAA >60 10/08/2015 2022    I No results found for: SPEP  Lab Results  Component Value Date   WBC 5.1 09/06/2016   NEUTROABS 3.0 09/06/2016   HGB 12.5 09/06/2016   HCT 37.1 09/06/2016   MCV 90.0 09/06/2016   PLT 262 09/06/2016      Chemistry      Component Value Date/Time   NA 136 09/06/2016 1114   K 4.1 09/06/2016 1114   CL 100 (L) 10/08/2015 2022   CO2 24 09/06/2016 1114   BUN 15.7 09/06/2016 1114   CREATININE 0.9 09/06/2016 1114      Component Value Date/Time   CALCIUM 9.7 09/06/2016 1114   ALKPHOS 146 09/06/2016 1114   AST 12 09/06/2016 1114   ALT 16 09/06/2016 1114   BILITOT 0.58 09/06/2016 1114       No results found for: LABCA2  No components found for: LABCA125  No results for input(s): INR in the last 168 hours.  Urinalysis    Component Value Date/Time   COLORURINE YELLOW 10/08/2015 2032   APPEARANCEUR CLEAR 10/08/2015 2032   LABSPEC  1.017 10/08/2015 2032   PHURINE 6.5 10/08/2015 2032   GLUCOSEU NEGATIVE 10/08/2015 2032   HGBUR TRACE (A) 10/08/2015 2032   BILIRUBINUR NEGATIVE 10/08/2015 2032   KETONESUR NEGATIVE 10/08/2015 2032   PROTEINUR 100 (A) 10/08/2015 2032   UROBILINOGEN 1.0 11/11/2013 1054   NITRITE POSITIVE (A) 10/08/2015 2032   LEUKOCYTESUR SMALL (A) 10/08/2015 2032    STUDIES: Study Result   CLINICAL DATA:  Annual examination. The patient has a history of bilateral breast cancer. She is status post mastectomy on the left. 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 She underwent wire localization prior to lumpectomy and completed radiation therapy. She is asymptomatic.  EXAM: 2D DIGITAL DIAGNOSTIC UNILATERAL RIGHT MAMMOGRAM WITH CAD AND ADJUNCT TOMO  COMPARISON:  Previous exam(s).  ACR Breast Density Category b: There are scattered areas of fibroglandular density.  FINDINGS: There are stable lumpectomy changes in the central/outer right breast. No nonsurgical architectural distortion, mass, or suspicious microcalcification is identified in the right breast to suggest malignancy.  Mammographic images were processed with CAD.  IMPRESSION: No evidence of malignancy in the right breast. Stable lumpectomy changes.  RECOMMENDATION: Diagnostic mammogram is suggested in 1 year. (Code:[id number]-B-01Y)  I have discussed the findings and recommendations with the patient. Results were also provided in writing at the conclusion of the visit. If applicable, a reminder letter will be sent to the patient regarding the next appointment.  BI-RADS CATEGORY  2: Benign.   Electronically Signed   By: Curlene Dolphin M.D.   On: 08/12/2016 11:23   Result History   ASSESSMENT: 61 y.o. BRCA negative [place] woman  LEFT BREAST (1)  status post left breast upper outer quadrant and left axillary lymph node biopsy  03/07/2013, both positive for an invasive ductal carcinoma, grade 1 or 2, estrogen receptor and progesterone receptor both strongly positive, HER-2 negative, with an MIB-1 between 13% and 15%  (2) biopsy of a more anterior mass in the left breast 04/03/2013 showed invasive ductal carcinoma, grade 2, estrogen and progesterone receptor positive, HER-2 negative, with an MIB-1 of 33%  (3) status post left modified radical mastectomy 04/22/2013 for mpT3 pN2, stage IIIA invasive ductal carcinoma, grade 1, repeat HER-2 again negative; 4 of 22 lymph nodes positive; ample margins   RIGHT BREAST (4) status post right central breast biopsy 04/03/2013 for ductal carcinoma in situ, 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% positive, progesterone receptor 11% positive   (5) right lumpectomy and sentinel lymph node sampling 04/22/2013 showed ductal carcinoma in situ, 3 cm, margins less than 1 mm medially  (a) skin biopsy right breast 06/17/2014 showed no evidence of malignancy.  ADJUVANT CHEMOTHERAPY (6) cyclophosphamide, epirubicin, and fluorouracil given every 14 days x6 cycles completed 08/02/2013  (7) weekly paclitaxel x2, discontinued 08/23/2013, with poor tolerance   (8) Abraxane given day 1 day 8 of each 21 day cycle, with some delays, for a total 8 doses, completed 11/23/2023  ADJUVANT RADIATION (9) adjuvant radiation therapy completed 03/19/2014  ANTI-ESTROGEN THERAPY (10) started anastrozole 04/16/2014; dexa scan 04/03/2014 showed mild osteopenia (T - 1.4)  GENETIC TESTING (11) a broad genetic panel sent December 2014 showed a variant of uncertain significance, 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  There were no deleterious mutations noted in the other genes tested, which included ATM, BARD1, BRCA1, BRCA2, BRIP1, CDH1, CHEK2, EPCAM, FANCC, MLH1, MSH2, MSH6, NBN, PALB2, PMS2, PTEN, RAD51C, RAD51D, STK11, TP53, and XRCC2.    PLan: Kathy Maxwell is now 3-1/2 years out from definitive surgery for her breast cancer with no evidence of disease  recurrence. This is very favorable.  She is tolerating the anastrozole well. Given the fact that she had bilateral tumor signed localLY advanced disease it may be prudent to continue the anastrozole for a total of 7 years.  She was not on vitamin D supplementation and I have restarted that. I have encouraged her to do more weightbearing exercise and specifically walk.  Otherwise she will have her next mammography in November and she will return to see me in one year. She knows to call for any problems that may develop before her next visit.    Chauncey Cruel, MD   09/13/2016 7:39 PM

## 2016-10-31 ENCOUNTER — Other Ambulatory Visit: Payer: Self-pay

## 2016-10-31 DIAGNOSIS — Z170 Estrogen receptor positive status [ER+]: Principal | ICD-10-CM

## 2016-10-31 DIAGNOSIS — C50412 Malignant neoplasm of upper-outer quadrant of left female breast: Secondary | ICD-10-CM

## 2016-10-31 MED ORDER — GABAPENTIN 100 MG PO CAPS: 300.0000 mg | ORAL_CAPSULE | Freq: Three times a day (TID) | ORAL | 6 refills | Status: DC

## 2016-10-31 MED ORDER — ANASTROZOLE 1 MG PO TABS: 1.0000 mg | ORAL_TABLET | Freq: Every day | ORAL | 4 refills | Status: DC

## 2016-10-31 MED FILL — ANASTROZOLE 1 MG TABLET: 1 | 90 days supply | Qty: 90 | Fill #0

## 2016-10-31 MED FILL — GABAPENTIN 100 MG CAPSULE: 100 | 30 days supply | Qty: 270 | Fill #0

## 2016-10-31 MED FILL — LOSARTAN POTASSIUM 100 MG T: 100 | 30 days supply | Qty: 30 | Fill #2

## 2016-10-31 NOTE — Telephone Encounter (Signed)
Call from [place] that anastrazole and gabapentin were sent to [facility] rather than [facility]. Called pt and confirmed she wants them from [facility]. [facility] states [facility] fill at [facility]. RX resent.

## 2016-11-01 ENCOUNTER — Ambulatory Visit (HOSPITAL_BASED_OUTPATIENT_CLINIC_OR_DEPARTMENT_OTHER): Payer: PPO

## 2016-11-01 DIAGNOSIS — C773 Secondary and unspecified malignant neoplasm of axilla and upper limb lymph nodes: Secondary | ICD-10-CM

## 2016-11-01 DIAGNOSIS — D0511 Intraductal carcinoma in situ of right breast: Secondary | ICD-10-CM | POA: Diagnosis not present

## 2016-11-01 DIAGNOSIS — C50412 Malignant neoplasm of upper-outer quadrant of left female breast: Secondary | ICD-10-CM

## 2016-11-01 DIAGNOSIS — Z170 Estrogen receptor positive status [ER+]: Secondary | ICD-10-CM

## 2016-11-01 DIAGNOSIS — Z452 Encounter for adjustment and management of vascular access device: Secondary | ICD-10-CM

## 2016-11-01 DIAGNOSIS — Z95828 Presence of other vascular implants and grafts: Secondary | ICD-10-CM

## 2016-11-01 MED ORDER — HEPARIN SOD (PORK) LOCK FLUSH 100 UNIT/ML IV SOLN
500.0000 [IU] | INTRAVENOUS | Status: AC | PRN
  Administered 2016-11-01: 500 [IU]
  Filled 2016-11-01: qty 5

## 2016-11-01 MED ORDER — SODIUM CHLORIDE 0.9 % IJ SOLN
10.0000 mL | INTRAMUSCULAR | Status: AC | PRN
  Administered 2016-11-01: 10 mL
  Filled 2016-11-01: qty 10

## 2016-11-01 NOTE — Patient Instructions (Signed)

## 2016-11-28 DIAGNOSIS — I10 Essential (primary) hypertension: Secondary | ICD-10-CM | POA: Diagnosis not present

## 2016-11-28 DIAGNOSIS — E1165 Type 2 diabetes mellitus with hyperglycemia: Secondary | ICD-10-CM | POA: Diagnosis not present

## 2016-11-28 DIAGNOSIS — E784 Other hyperlipidemia: Secondary | ICD-10-CM | POA: Diagnosis not present

## 2016-12-26 ENCOUNTER — Encounter: Payer: Self-pay | Admitting: Adult Health

## 2016-12-26 ENCOUNTER — Other Ambulatory Visit: Payer: Self-pay | Admitting: Adult Health

## 2016-12-26 ENCOUNTER — Ambulatory Visit (HOSPITAL_BASED_OUTPATIENT_CLINIC_OR_DEPARTMENT_OTHER): Payer: PPO | Admitting: Adult Health

## 2016-12-26 ENCOUNTER — Other Ambulatory Visit: Payer: Self-pay | Admitting: *Deleted

## 2016-12-26 ENCOUNTER — Ambulatory Visit (HOSPITAL_COMMUNITY)
Admission: RE | Admit: 2016-12-26 | Discharge: 2016-12-26 | Disposition: A | Discharge status: Home / Self Care | Payer: PPO | Source: Ambulatory Visit | Attending: Adult Health | Admitting: Adult Health

## 2016-12-26 ENCOUNTER — Telehealth: Payer: Self-pay

## 2016-12-26 VITALS — BP 162/56 | HR 79 | Temp 98.4°F | Resp 19 | Wt 213.9 lb

## 2016-12-26 DIAGNOSIS — M8580 Other specified disorders of bone density and structure, unspecified site: Secondary | ICD-10-CM | POA: Diagnosis not present

## 2016-12-26 DIAGNOSIS — M25551 Pain in right hip: Secondary | ICD-10-CM | POA: Diagnosis not present

## 2016-12-26 DIAGNOSIS — C773 Secondary and unspecified malignant neoplasm of axilla and upper limb lymph nodes: Secondary | ICD-10-CM | POA: Diagnosis not present

## 2016-12-26 DIAGNOSIS — Z170 Estrogen receptor positive status [ER+]: Secondary | ICD-10-CM

## 2016-12-26 DIAGNOSIS — D0511 Intraductal carcinoma in situ of right breast: Secondary | ICD-10-CM

## 2016-12-26 DIAGNOSIS — C50412 Malignant neoplasm of upper-outer quadrant of left female breast: Secondary | ICD-10-CM | POA: Diagnosis not present

## 2016-12-26 DIAGNOSIS — M1611 Unilateral primary osteoarthritis, right hip: Secondary | ICD-10-CM | POA: Insufficient documentation

## 2016-12-26 MED ORDER — NAPROXEN 500 MG PO TABS: 500.0000 mg | ORAL_TABLET | Freq: Two times a day (BID) | ORAL | 0 refills | Status: DC

## 2016-12-26 NOTE — Progress Notes (Signed)
[place]  [phone] [phone]) [phone]     ID: Kathy Maxwell DOB: [birth year]  MR#: [id number]  DVV#:[id number]  Patient Care Team: Glendale Chard, MD as PCP - General (Internal Medicine) Fanny Skates, MD as Consulting Physician (General Surgery) Thea Silversmith, MD (Inactive) as Consulting Physician (Radiation Oncology)  CHIEF COMPLAINT: Locally advanced estrogen receptor positive breast cancer  CURRENT TREATMENT: anastrozole   BREAST CANCER HISTORY: From Dr. Dana Allan original intake note, 04/04/2013:  "TAKYAH Maxwell is a 61 y.o. female. With other medical problems who underwent a screening mammogram that led to a diagnostic mammogram and ultrasound of the left breast. The mammogram and ultrasound showed a 2.9 x 1.6 x 1.9 cm suspicious mass in the left breast at the 12:30 oh clock position 6 cm from the nipple. There was also a suspicious left axillary lymph node. She had image guided biopsy of the left breast mass and the lymph node. The pathology revealed an invasive mammary carcinoma probably ductal phenotype. Tumor was estrogen receptor +100% progesterone receptor +98% proliferation marker Ki-67 13% and HER-2/neu negative. Patient had MRI of the breasts performed on 03/15/2013 the left breast revealed a 7.4 cm area of what the lymph node. Right breast revealed and a 3.3 cm mass. Patient was seen by Dr. Fanny Skates for discussion of surgical options. He has referred the patient to medical oncology for discussion whether or not she will need a Port-A-Cath and chemotherapy."  The patient underwent left modified radical mastectomy 04/22/2014 showing multiple foci of low-grade invasive ductal carcinoma, the largest single lesion measuring 5.5 cm, and involving 4/22 lymph nodes. Repeat HER-2 study was again negative. 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 The patient proceeded to adjuvant chemotherapy completed May 2015 as summarized below. She is currently receiving radiation therapy.  The patient's subsequent history is detailed below.  INTERVAL HISTORY: Aleaya returns today for evaluation of her right hip pain.  This has been going on for about 1-2 months.  She cannot recall a precipitating factor.  She says it starts in the side of her hip and radiates downward.  It is a 9/10.  The pain is constant and varies in severity.  She says her hip feels like it is going to lock up.  Aggravating factors: standing, turning over, and moving.  Alleviating factors: Tylenol occasionally helps, but short lived relief.  This has gotten to the point where it is interfering with her ability to walk.  She is now using a cane daily.    REVIEW OF SYSTEMS: Kathy Maxwell is taking anastrozole daily and tolerating it well for the most part.  Other than what is noted above a detailed ROS is non contributory.    PAST MEDICAL HISTORY: Past Medical History:  Diagnosis Date  . Allergy    latex  . Arthritis   . Bipolar 1 disorder ([place])   . Bipolar 1 disorder ([place]) 04/04/2013  . Blood transfusion without reported diagnosis    2014 during chemotherapy  . Breast cancer ([place]) 03/10/13   left breast invasive ca  . Cancer ([place])   . Coronary artery disease   . Diabetes mellitus without complication (HCC)    NIDDM  . GERD (gastroesophageal reflux disease)   .  Heart murmur   . Hyperlipidemia   . Hyperlipidemia 04/04/2013  . Hypertension   . Hypertension 04/04/2013  . Obesity   . Radiation 01/30/14-03/19/14  . Sleep apnea    no cpap due to insurance    PAST SURGICAL HISTORY: Past Surgical History:  Procedure Laterality Date  . ABDOMINAL HYSTERECTOMY    . BREAST BIOPSY Left 05/08/09   fibroadenoma with  microcalcifications,no malignancy id  . BREAST BIOPSY Left 03/07/13   invasive ca,1 lymph node metastatic  . 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 BREAST LUMPECTOMY WITH NEEDLE LOCALIZATION AND AXILLARY SENTINEL LYMPH NODE BX Right 04/22/2013   Procedure: BREAST LUMPECTOMY WITH NEEDLE LOCALIZATION AND AXILLARY SENTINEL LYMPH NODE BX;  Surgeon: [name], MD;  Location: [facility] OR;  Service: General;  Laterality: Right;  . CARDIAC CATHETERIZATION    . CHOLECYSTECTOMY    . CORONARY ANGIOPLASTY     LAD stent 2003  . CORONARY STENT PLACEMENT  2001  . ESOPHAGOGASTRODUODENOSCOPY N/A 08/07/2013   Procedure: ESOPHAGOGASTRODUODENOSCOPY (EGD);  Surgeon: Barrie Folk, MD;  Location: [facility];  Service: Endoscopy;  Laterality: N/A;  . ESOPHAGOGASTRODUODENOSCOPY N/A 11/13/2013   Procedure: ESOPHAGOGASTRODUODENOSCOPY (EGD);  Surgeon: Graylin Shiver, MD;  Location: [facility] ENDOSCOPY;  Service: Endoscopy;  Laterality: N/A;  . EYE SURGERY  6/13,9/14   laser,cataract lft  . MASTECTOMY MODIFIED RADICAL Left 04/22/2013   Procedure: MASTECTOMY MODIFIED RADICAL;  Surgeon: [name], MD;  Location: [facility] OR;  Service: General;  Laterality: Left;  . PORTACATH PLACEMENT Right 04/22/2013   Procedure: INSERTION PORT-A-CATH;  Surgeon: [name], MD;  Location[facility] OR;  Service: General;  Laterality: Right;    FAMILY HISTORY Family History  Problem Relation Age of Onset  . Heart disease Mother   . Breast cancer Maternal Aunt        dx in her 82s  . Heart disease Cousin 48       maternal cousin  . Heart attack Sister 35  . Cancer Paternal Aunt        NOS  . Cancer Maternal Aunt        NOS  . Lung cancer Cousin 57       maternal cousin - non smoker  . Brain cancer Cousin 13       maternal cousin's son  . Lung cancer Cousin 59       maternal cousin's son  . Breast cancer Cousin        paternal cousin  . Colon cancer Neg Hx    the patient's father  died from alcoholic cirrhosis at the age of 30. The patient's mother died from congestive heart failure the age of 26. The patient had no brothers, 4 sisters. One sister died from a myocardial infarction at the age of 51. The only other breast cancers in the family are a maternal aunt(Ot[place] 5) with breast cancer diagnosed after the age of 69 and a second cousin on the mother's side, diagnosed with breast cancer approximately age 63. There is no history of ovarian cancer in the family. The patient has undergone genetic testing and his BRCA negative  GYNECOLOGIC HISTORY:  No LMP recorded. Patient is postmenopausal. Menarche age 14. The patient is GX P0. She underwent hysterectomy with bilateral salpingo-oophorectomy in 1997. She took hormone replacement briefly.  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 She is single. Currently she shares a home with a  friend, but is looking for her own place. She attends a local church of [employer]: Not in place. At her 02/12/2014 visit the patient was given the appropriate documents to complete and notarize at her discretion. She tells me she intends to name her goddaughter,, Gerlene Burdock, who is a CNA, as her healthcare power of attorney. Ms. Ky Barban can be reached at [place]: Social History  Substance Use Topics  . Smoking status: Former Smoker    Packs/day: 1.00    Years: 15.00    Types: Cigarettes    Quit date: 07/19/1987  . Smokeless tobacco: Never Used  . Alcohol use No     Colonoscopy: Never  PAP:  Bone density: Never  Lipid panel:  Allergies  Allergen Reactions  . Cephalexin Other (See Comments)    Burned throat.  . Adhesive [Tape] Rash    Clear tape gloves ,elastic no problem  . Latex Hives and Rash    Current Outpatient Prescriptions  Medication Sig Dispense Refill  . amLODipine (NORVASC) 10 MG tablet Take 10 mg by mouth daily.    [name] anastrozole  (ARIMIDEX) 1 MG tablet Take 1 tablet (1 mg total) by mouth daily. 90 tablet 4  . b complex vitamins tablet Take 1 tablet by mouth daily.    [name] buPROPion (WELLBUTRIN SR) 150 MG 12 hr tablet Take 1 tablet (150 mg total) by mouth daily. 30 tablet 6  . cholecalciferol (VITAMIN D) 1000 units tablet Take 1 tablet (1,000 Units total) by mouth daily. 100 tablet 4  . fluticasone (FLONASE) 50 MCG/ACT nasal spray Place 1 spray into both nostrils daily. (Patient not taking: Reported on 02/01/2016) 9.9 g 0  . gabapentin (NEURONTIN) 100 MG capsule Take 3 capsules (300 mg total) by mouth 3 (three) times daily. 270 capsule 6  . hydrALAZINE (APRESOLINE) 25 MG tablet Take 25 mg by mouth 3 (three) times daily.     [name] lidocaine-prilocaine (EMLA) cream Apply topically as needed. Apply to port-a-cath one - two hours before accessing. (Patient not taking: Reported on 02/01/2016) 30 g 3  . losartan (COZAAR) 100 MG tablet Take 1 tablet (100 mg total) by mouth daily. (Patient not taking: Reported on 02/09/2016) 90 tablet 0  . metoprolol succinate (TOPROL-XL) 25 MG 24 hr tablet Take 25 mg by mouth daily.     . naproxen (NAPROSYN) 500 MG tablet Take 1 tablet (500 mg total) by mouth 2 (two) times daily with a meal. 60 tablet 0  . pantoprazole (PROTONIX) 40 MG tablet Take 1 tablet (40 mg total) by mouth daily. 30 tablet 3  . pioglitazone-metformin (ACTOPLUS MET) 15-850 MG per tablet Take 1 tablet by mouth every morning.     . sitaGLIPtin (JANUVIA) 100 MG tablet Take 100 mg by mouth daily.     No current facility-administered medications for this visit.    Facility-Administered Medications Ordered in Other Visits  Medication Dose Route Frequency Provider Last Rate Last Dose  . sodium chloride 0.9 % injection 10 mL  10 mL Intravenous PRN Marcy Panning, MD   10 mL at 05/24/13 1522    OBJECTIVE:  Vitals:   12/26/16 1325  BP: (!) 162/56  Pulse: 79  Resp: 19  Temp: 98.4 F (36.9 C)     There is no height or weight on file to  calculate BMI.    ECOG FS:1 - Symptomatic but completely ambulatory GENERAL: Patient is a well appearing female in no acute distress HEENT:  Sclerae anicteric.  Oropharynx clear and moist. No ulcerations or evidence of oropharyngeal candidiasis. Neck is supple.  NODES:  No cervical, supraclavicular, or axillary lymphadenopathy palpated.  BREAST EXAM:  Deferred. LUNGS:  Clear to auscultation bilaterally.  No wheezes or rhonchi. HEART:  Regular rate and rhythm. +systolic murmur ABDOMEN:  Soft, nontender.  Positive, normoactive bowel sounds. No organomegaly palpated. MSK:  No focal spinal tenderness to palpation. Tenderness to right greater trochanter to palpation with hip rotated inward.  Obvious pain and discomfort with all right hip ROM and with walking.   EXTREMITIES:  No peripheral edema.   SKIN:  Clear with no obvious rashes or skin changes. No nail dyscrasia. NEURO:  Nonfocal. Well oriented.  Appropriate affect.      LAB RESULTS:  CMP     Component Value Date/Time   NA 136 09/06/2016 1114   K 4.1 09/06/2016 1114   CL 100 (L) 10/08/2015 2022   CO2 24 09/06/2016 1114   GLUCOSE 286 (H) 09/06/2016 1114   BUN 15.7 09/06/2016 1114   CREATININE 0.9 09/06/2016 1114   CALCIUM 9.7 09/06/2016 1114   PROT 7.1 09/06/2016 1114   ALBUMIN 3.5 09/06/2016 1114   AST 12 09/06/2016 1114   ALT 16 09/06/2016 1114   ALKPHOS 146 09/06/2016 1114   BILITOT 0.58 09/06/2016 1114   GFRNONAA 53 (L) 10/08/2015 2022   GFRAA >60 10/08/2015 2022    I No results found for: SPEP  Lab Results  Component Value Date   WBC 5.1 09/06/2016   NEUTROABS 3.0 09/06/2016   HGB 12.5 09/06/2016   HCT 37.1 09/06/2016   MCV 90.0 09/06/2016   PLT 262 09/06/2016      Chemistry      Component Value Date/Time   NA 136 09/06/2016 1114   K 4.1 09/06/2016 1114   CL 100 (L) 10/08/2015 2022   CO2 24 09/06/2016 1114   BUN 15.7 09/06/2016 1114   CREATININE 0.9 09/06/2016 1114      Component Value Date/Time    CALCIUM 9.7 09/06/2016 1114   ALKPHOS 146 09/06/2016 1114   AST 12 09/06/2016 1114   ALT 16 09/06/2016 1114   BILITOT 0.58 09/06/2016 1114       No results found for: LABCA2  No components found for: LABCA125  No results for input(s): INR in the last 168 hours.  Urinalysis    Component Value Date/Time   COLORURINE YELLOW 10/08/2015 2032   APPEARANCEUR CLEAR 10/08/2015 2032   LABSPEC 1.017 10/08/2015 2032   PHURINE 6.5 10/08/2015 2032   GLUCOSEU NEGATIVE 10/08/2015 2032   HGBUR TRACE (A) 10/08/2015 2032   BILIRUBINUR NEGATIVE 10/08/2015 2032   KETONESUR NEGATIVE 10/08/2015 2032   PROTEINUR 100 (A) 10/08/2015 2032   UROBILINOGEN 1.0 11/11/2013 1054   NITRITE POSITIVE (A) 10/08/2015 2032   LEUKOCYTESUR SMALL (A) 10/08/2015 2032    STUDIES: Study Result   CLINICAL DATA:  Annual examination. The patient has a history of bilateral breast cancer. She is status post mastectomy on the left. 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 She underwent wire localization prior to lumpectomy and completed radiation therapy. She is asymptomatic.  EXAM: 2D DIGITAL DIAGNOSTIC UNILATERAL RIGHT MAMMOGRAM WITH CAD AND ADJUNCT TOMO  COMPARISON:  Previous exam(s).  ACR Breast Density Category b: There are scattered areas of fibroglandular density.  FINDINGS: There are stable lumpectomy changes in the central/outer right breast. No nonsurgical architectural distortion,  mass, or suspicious microcalcification is identified in the right breast to suggest malignancy.  Mammographic images were processed with CAD.  IMPRESSION: No evidence of malignancy in the right breast. Stable lumpectomy changes.  RECOMMENDATION: Diagnostic mammogram is suggested in 1 year. (Code:[id number]-B-01Y)  I have discussed the findings and recommendations with the patient. Results were also provided in writing at the  conclusion of the visit. If applicable, a reminder letter will be sent to the patient regarding the next appointment.  BI-RADS CATEGORY  2: Benign.   Electronically Signed   By: Curlene Dolphin M.D.   On: 08/12/2016 11:23   Result History   ASSESSMENT: 61 y.o. BRCA negative [place] woman  LEFT BREAST (1) status post left breast upper outer quadrant and left axillary lymph node biopsy 03/07/2013, both positive for an invasive ductal carcinoma, grade 1 or 2, estrogen receptor and progesterone receptor both strongly positive, HER-2 negative, with an MIB-1 between 13% and 15%  (2) biopsy of a more anterior mass in the left breast 04/03/2013 showed invasive ductal carcinoma, grade 2, estrogen and progesterone receptor positive, HER-2 negative, with an MIB-1 of 33%  (3) status post left modified radical mastectomy 04/22/2013 for mpT3 pN2, stage IIIA invasive ductal carcinoma, grade 1, repeat HER-2 again negative; 4 of 22 lymph nodes positive; ample margins   RIGHT BREAST (4) status post right central breast biopsy 04/03/2013 for ductal carcinoma in situ, 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% positive, progesterone receptor 11% positive   (5) right lumpectomy and sentinel lymph node sampling 04/22/2013 showed ductal carcinoma in situ, 3 cm, margins less than 1 mm medially  (a) skin biopsy right breast 06/17/2014 showed no evidence of malignancy.  ADJUVANT CHEMOTHERAPY (6) cyclophosphamide, epirubicin, and fluorouracil given every 14 days x6 cycles completed 08/02/2013  (7) weekly paclitaxel x2, discontinued 08/23/2013, with poor tolerance   (8) Abraxane given day 1 day 8 of each 21 day cycle, with some delays, for a total 8 doses, completed 11/23/2023  ADJUVANT RADIATION (9) adjuvant radiation therapy completed 03/19/2014  ANTI-ESTROGEN THERAPY (10) started anastrozole 04/16/2014; dexa scan 04/03/2014 showed mild osteopenia (T - 1.4)  GENETIC TESTING (11) a broad genetic panel sent December  2014 showed a variant of uncertain significance, 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  There were no deleterious mutations noted in the other genes tested, which included ATM, BARD1, BRCA1, BRCA2, BRIP1, CDH1, CHEK2, EPCAM, FANCC, MLH1, MSH2, MSH6, NBN, PALB2, PMS2, PTEN, RAD51C, RAD51D, STK11, TP53, and XRCC2.    Plan: Rajanae's hip pain is likely unrelated to cancer.  Since its tremendously worse, I will get another plain film today.  I think that the pain is likely related to trochanteric bursitis of the right hip.  I prescribed Naproxen BID with meals for her to take briefly until she gets in with ortho.  Ortho referral placed as she will likely benefit from injection.    A total of (20) minutes of face-to-face time was spent with this patient with greater than 50% of that time in counseling and care-coordination.   Scot Dock, NP   12/26/2016 1:52 PM

## 2016-12-26 NOTE — Telephone Encounter (Signed)
Pt called reporting that right hip and leg pain has increased to the point that she now needs to use her cane to get up out of the chair and that there is some swelling.  Pt states the leg does not feel any cooler or warmer than her left leg, that she can tell, but she has been alternating warm / cold compresses to help relieve pain / swelling.  Appt scheduled to see [name] NP at 1:00 today

## 2016-12-28 ENCOUNTER — Telehealth: Payer: Self-pay | Admitting: *Deleted

## 2016-12-28 NOTE — Telephone Encounter (Signed)
Per Mendel Ryder, NP made patient aware x-ray results were normal, and a referral to [facility] orthopedic will be made. Notified [facility] of the referral and spoke with Anderson Malta who informed me of patient outstanding balance, that would prevent scheduling an appointment. Patient needs to contact [facility] patient resource center to discuss further. Attempt made to notify the patient of this, left voice message.  Referral faxed to [facility].

## 2016-12-30 ENCOUNTER — Ambulatory Visit (HOSPITAL_BASED_OUTPATIENT_CLINIC_OR_DEPARTMENT_OTHER): Payer: PPO

## 2016-12-30 DIAGNOSIS — Z452 Encounter for adjustment and management of vascular access device: Secondary | ICD-10-CM

## 2016-12-30 DIAGNOSIS — Z170 Estrogen receptor positive status [ER+]: Secondary | ICD-10-CM

## 2016-12-30 DIAGNOSIS — C773 Secondary and unspecified malignant neoplasm of axilla and upper limb lymph nodes: Secondary | ICD-10-CM

## 2016-12-30 DIAGNOSIS — Z95828 Presence of other vascular implants and grafts: Secondary | ICD-10-CM

## 2016-12-30 DIAGNOSIS — C50412 Malignant neoplasm of upper-outer quadrant of left female breast: Secondary | ICD-10-CM | POA: Diagnosis not present

## 2016-12-30 MED ORDER — HEPARIN SOD (PORK) LOCK FLUSH 100 UNIT/ML IV SOLN
250.0000 [IU] | INTRAVENOUS | Status: DC | PRN
  Administered 2016-12-30: 250 [IU]
  Filled 2016-12-30: qty 5

## 2016-12-30 MED ORDER — SODIUM CHLORIDE 0.9 % IJ SOLN
10.0000 mL | INTRAMUSCULAR | Status: AC | PRN
  Administered 2016-12-30: 10 mL
  Filled 2016-12-30: qty 10

## 2017-01-09 DIAGNOSIS — C50912 Malignant neoplasm of unspecified site of left female breast: Secondary | ICD-10-CM | POA: Diagnosis not present

## 2017-01-09 DIAGNOSIS — Z853 Personal history of malignant neoplasm of breast: Secondary | ICD-10-CM | POA: Diagnosis not present

## 2017-01-31 MED FILL — GABAPENTIN 100 MG CAPSULE: 100 | 30 days supply | Qty: 270 | Fill #1

## 2017-01-31 MED FILL — LOSARTAN POTASSIUM 100 MG T: 100 | 30 days supply | Qty: 30 | Fill #0

## 2017-01-31 MED FILL — METOPROLOL SUCC ER 25 MG TA: 25 | 90 days supply | Qty: 90 | Fill #0

## 2017-01-31 MED FILL — ANASTROZOLE 1 MG TABLET: 1 | 90 days supply | Qty: 90 | Fill #1

## 2017-02-02 ENCOUNTER — Telehealth: Payer: Self-pay | Admitting: Emergency Medicine

## 2017-02-02 ENCOUNTER — Other Ambulatory Visit: Payer: Self-pay | Admitting: Emergency Medicine

## 2017-02-02 NOTE — Telephone Encounter (Signed)
[name] NP stated do not refill pts Naproxen 500mg  tab for pts past hip pain from previous visit.

## 2017-02-17 ENCOUNTER — Telehealth: Payer: Self-pay | Admitting: Adult Health

## 2017-02-17 NOTE — Telephone Encounter (Signed)
Called pt LVM with call back number to follow up on pt symptoms and to see if she still needs a referral to see ortho. Will try to call pt again to follow up shortly.

## 2017-02-17 NOTE — Telephone Encounter (Signed)
Received message on my voice mail from patient asking if I had placed the ortho referral as discussed at our appt on 12/26/2016.  Patient has not yet received appointment.  She is checking up on this.    Can you please call patient and see how her hip is doing and place referral in addition to call over to [facility] if she is still having issues?  She had trochanteric bursitis when I saw her last and likely needed an injection.  If it has changed in any way such as location, or distribution, let me know as she may need another evaluation prior to referral.    Thanks!  Mendel Ryder

## 2017-02-20 ENCOUNTER — Telehealth: Payer: Self-pay | Admitting: Emergency Medicine

## 2017-02-20 NOTE — Telephone Encounter (Signed)
Called patient back for [name], NP. Patient did not answer. VM left.

## 2017-02-24 ENCOUNTER — Ambulatory Visit (HOSPITAL_BASED_OUTPATIENT_CLINIC_OR_DEPARTMENT_OTHER): Payer: PPO

## 2017-02-24 ENCOUNTER — Telehealth: Payer: Self-pay

## 2017-02-24 DIAGNOSIS — D0511 Intraductal carcinoma in situ of right breast: Secondary | ICD-10-CM | POA: Diagnosis not present

## 2017-02-24 DIAGNOSIS — Z452 Encounter for adjustment and management of vascular access device: Secondary | ICD-10-CM | POA: Diagnosis not present

## 2017-02-24 DIAGNOSIS — C50412 Malignant neoplasm of upper-outer quadrant of left female breast: Secondary | ICD-10-CM | POA: Diagnosis not present

## 2017-02-24 DIAGNOSIS — C773 Secondary and unspecified malignant neoplasm of axilla and upper limb lymph nodes: Secondary | ICD-10-CM | POA: Diagnosis not present

## 2017-02-24 DIAGNOSIS — Z95828 Presence of other vascular implants and grafts: Secondary | ICD-10-CM

## 2017-02-24 DIAGNOSIS — Z170 Estrogen receptor positive status [ER+]: Secondary | ICD-10-CM

## 2017-02-24 MED ORDER — HEPARIN SOD (PORK) LOCK FLUSH 100 UNIT/ML IV SOLN
500.0000 [IU] | INTRAVENOUS | Status: DC | PRN
  Administered 2017-02-24: 500 [IU]
  Filled 2017-02-24: qty 5

## 2017-02-24 MED ORDER — SODIUM CHLORIDE 0.9 % IJ SOLN
10.0000 mL | INTRAMUSCULAR | Status: AC | PRN
  Administered 2017-02-24: 10 mL
  Filled 2017-02-24: qty 10

## 2017-02-24 NOTE — Patient Instructions (Signed)

## 2017-02-24 NOTE — Telephone Encounter (Signed)
Pt called inquiring about her referral to an orthopedic surgeon stating no one has called her to make an appt.  Per previous documentation referral was placed and records faxed but unable to schedule appt for pt d/t outstanding balance with [place] [facility].  Returned pt's call giving her this information and phone number for [facility].  Pt states she will call them

## 2017-03-14 ENCOUNTER — Telehealth: Payer: Self-pay

## 2017-03-14 ENCOUNTER — Telehealth: Payer: Self-pay | Admitting: *Deleted

## 2017-03-14 MED FILL — LOSARTAN POTASSIUM 100 MG T: 100 | 30 days supply | Qty: 30 | Fill #1

## 2017-03-14 NOTE — Telephone Encounter (Signed)
FYI "I'm returning a call from May.  She called me about seeing an orthopedic surgeon.  Referral needed.  Pain has gotten worse to right hip.  I can hardly walk."  No call from [facility] today.  Discussed last call documented on 02-24-2017.   "I called [facility], left message and no return call.  I need a referral.  I saw them ten years ago for my foot.  This is something totally different."  Advised return call to [facility].  Referral submitted denied.  Payment arrangements, records to transfer care to different orthopedic provider.  Transfer to different orthopedic office is reviewed with acceptance of transfer pending review.  For pain to hip, [facility] and [facility] and [facility] orthopedic offices have orthopedic urgent care after hours.   Do you have [facility] number?  Provided [phone].  No further questions at this time.

## 2017-03-14 NOTE — Telephone Encounter (Signed)
Patient called in regards to a message left by [facility] LPN.  She wanted to know what she wanted.  By the notes Mendel Ryder was checking in with her concerning her right hip pain and wanted to see if she had made an appt with ortho (referral was sent).    Patient states that hip is still giving problems.  She says that she called [facility] and they told her "no one would take her because she had dealt with a [facility] surgeon before". And patient also has an [facility].  Patient states she has been waiting for them to get back with her concerning the bill to see if they can see her.  She states she cannot live in pain.  This nurse suggested that she call the center back and see if she could speak with some one.  She stated that she would.

## 2017-04-21 ENCOUNTER — Other Ambulatory Visit: Payer: Self-pay | Admitting: Oncology

## 2017-04-21 ENCOUNTER — Ambulatory Visit (HOSPITAL_BASED_OUTPATIENT_CLINIC_OR_DEPARTMENT_OTHER): Payer: PPO

## 2017-04-21 DIAGNOSIS — C50412 Malignant neoplasm of upper-outer quadrant of left female breast: Secondary | ICD-10-CM

## 2017-04-21 DIAGNOSIS — Z452 Encounter for adjustment and management of vascular access device: Secondary | ICD-10-CM

## 2017-04-21 DIAGNOSIS — Z95828 Presence of other vascular implants and grafts: Secondary | ICD-10-CM

## 2017-04-21 DIAGNOSIS — D0511 Intraductal carcinoma in situ of right breast: Secondary | ICD-10-CM

## 2017-04-21 DIAGNOSIS — F319 Bipolar disorder, unspecified: Secondary | ICD-10-CM

## 2017-04-21 DIAGNOSIS — C773 Secondary and unspecified malignant neoplasm of axilla and upper limb lymph nodes: Secondary | ICD-10-CM

## 2017-04-21 MED ORDER — SODIUM CHLORIDE 0.9% FLUSH
10.0000 mL | INTRAVENOUS | Status: DC | PRN
  Administered 2017-04-21: 10 mL via INTRAVENOUS
  Filled 2017-04-21: qty 10

## 2017-04-21 MED ORDER — HEPARIN SOD (PORK) LOCK FLUSH 100 UNIT/ML IV SOLN
500.0000 [IU] | Freq: Once | INTRAVENOUS | Status: AC
  Administered 2017-04-21: 500 [IU] via INTRAVENOUS
  Filled 2017-04-21: qty 5

## 2017-04-21 MED FILL — GABAPENTIN 100 MG CAPS: 100 | 30 days supply | Qty: 270 | Fill #2

## 2017-04-24 MED FILL — ANASTROZOLE 1 MG TABLET: 1 | 90 days supply | Qty: 90 | Fill #2

## 2017-05-12 DIAGNOSIS — Z23 Encounter for immunization: Secondary | ICD-10-CM | POA: Diagnosis not present

## 2017-05-12 DIAGNOSIS — C50919 Malignant neoplasm of unspecified site of unspecified female breast: Secondary | ICD-10-CM | POA: Diagnosis not present

## 2017-05-12 DIAGNOSIS — I10 Essential (primary) hypertension: Secondary | ICD-10-CM | POA: Diagnosis not present

## 2017-05-12 DIAGNOSIS — F319 Bipolar disorder, unspecified: Secondary | ICD-10-CM | POA: Diagnosis not present

## 2017-05-12 DIAGNOSIS — E1165 Type 2 diabetes mellitus with hyperglycemia: Secondary | ICD-10-CM | POA: Diagnosis not present

## 2017-05-12 DIAGNOSIS — Z0000 Encounter for general adult medical examination without abnormal findings: Secondary | ICD-10-CM | POA: Diagnosis not present

## 2017-06-02 ENCOUNTER — Inpatient Hospital Stay (HOSPITAL_BASED_OUTPATIENT_CLINIC_OR_DEPARTMENT_OTHER): Payer: PPO

## 2017-06-02 ENCOUNTER — Inpatient Hospital Stay: Payer: Self-pay

## 2017-06-02 DIAGNOSIS — F319 Bipolar disorder, unspecified: Secondary | ICD-10-CM | POA: Diagnosis not present

## 2017-06-02 DIAGNOSIS — C773 Secondary and unspecified malignant neoplasm of axilla and upper limb lymph nodes: Secondary | ICD-10-CM

## 2017-06-02 DIAGNOSIS — Z170 Estrogen receptor positive status [ER+]: Secondary | ICD-10-CM

## 2017-06-02 DIAGNOSIS — I10 Essential (primary) hypertension: Secondary | ICD-10-CM | POA: Diagnosis not present

## 2017-06-02 DIAGNOSIS — M79604 Pain in right leg: Secondary | ICD-10-CM | POA: Diagnosis not present

## 2017-06-02 DIAGNOSIS — C50412 Malignant neoplasm of upper-outer quadrant of left female breast: Secondary | ICD-10-CM

## 2017-06-02 DIAGNOSIS — Z95828 Presence of other vascular implants and grafts: Secondary | ICD-10-CM

## 2017-06-02 DIAGNOSIS — E1165 Type 2 diabetes mellitus with hyperglycemia: Secondary | ICD-10-CM | POA: Diagnosis not present

## 2017-06-02 DIAGNOSIS — D0511 Intraductal carcinoma in situ of right breast: Secondary | ICD-10-CM

## 2017-06-02 DIAGNOSIS — F329 Major depressive disorder, single episode, unspecified: Secondary | ICD-10-CM | POA: Diagnosis not present

## 2017-06-02 DIAGNOSIS — F3131 Bipolar disorder, current episode depressed, mild: Secondary | ICD-10-CM | POA: Diagnosis not present

## 2017-06-02 DIAGNOSIS — Z452 Encounter for adjustment and management of vascular access device: Secondary | ICD-10-CM

## 2017-06-02 MED ORDER — SODIUM CHLORIDE 0.9% FLUSH
10.0000 mL | Freq: Once | INTRAVENOUS | Status: AC
  Administered 2017-06-02: 10 mL
  Filled 2017-06-02: qty 10

## 2017-06-02 MED ORDER — HEPARIN SOD (PORK) LOCK FLUSH 100 UNIT/ML IV SOLN
500.0000 [IU] | Freq: Once | INTRAVENOUS | Status: AC
  Administered 2017-06-02: 500 [IU]
  Filled 2017-06-02: qty 5

## 2017-06-12 ENCOUNTER — Telehealth: Payer: Self-pay

## 2017-06-12 NOTE — Telephone Encounter (Signed)
Pt called to cancel her appt. Then realized it was a Friday and kept appt. She asked for labs to be drawn from port. Moved labs from 2/27 appt with Dr Jana Hakim to 1/11 appt with port flush.

## 2017-06-16 ENCOUNTER — Telehealth: Payer: Self-pay | Admitting: Oncology

## 2017-06-16 NOTE — Telephone Encounter (Signed)
Spoke to patient regarding upcoming appointment updates per 11/26 sch message.  °

## 2017-07-07 ENCOUNTER — Other Ambulatory Visit: Payer: Self-pay | Admitting: Internal Medicine

## 2017-07-07 DIAGNOSIS — Z853 Personal history of malignant neoplasm of breast: Secondary | ICD-10-CM

## 2017-07-26 ENCOUNTER — Other Ambulatory Visit: Payer: Self-pay

## 2017-07-26 DIAGNOSIS — C50412 Malignant neoplasm of upper-outer quadrant of left female breast: Secondary | ICD-10-CM

## 2017-07-26 DIAGNOSIS — Z170 Estrogen receptor positive status [ER+]: Principal | ICD-10-CM

## 2017-07-27 DIAGNOSIS — R69 Illness, unspecified: Secondary | ICD-10-CM | POA: Diagnosis not present

## 2017-07-27 MED FILL — GABAPENTIN 100 MG CAPS: 100 | 30 days supply | Qty: 270 | Fill #3

## 2017-07-27 MED FILL — LOSARTAN POTASSIUM 100 MG T: 100 | 30 days supply | Qty: 30 | Fill #2

## 2017-07-27 MED FILL — ANASTROZOLE 1 MG TABLET: 1 | 90 days supply | Qty: 90 | Fill #3

## 2017-07-28 ENCOUNTER — Inpatient Hospital Stay: Payer: Medicare HMO | Attending: Oncology

## 2017-07-28 ENCOUNTER — Inpatient Hospital Stay: Payer: Medicare HMO

## 2017-07-28 DIAGNOSIS — C50412 Malignant neoplasm of upper-outer quadrant of left female breast: Secondary | ICD-10-CM | POA: Diagnosis not present

## 2017-07-28 DIAGNOSIS — Z170 Estrogen receptor positive status [ER+]: Principal | ICD-10-CM

## 2017-07-28 DIAGNOSIS — Z452 Encounter for adjustment and management of vascular access device: Secondary | ICD-10-CM | POA: Diagnosis present

## 2017-07-28 DIAGNOSIS — D0511 Intraductal carcinoma in situ of right breast: Secondary | ICD-10-CM | POA: Insufficient documentation

## 2017-07-28 DIAGNOSIS — Z95828 Presence of other vascular implants and grafts: Secondary | ICD-10-CM

## 2017-07-28 DIAGNOSIS — C773 Secondary and unspecified malignant neoplasm of axilla and upper limb lymph nodes: Secondary | ICD-10-CM | POA: Insufficient documentation

## 2017-07-28 LAB — CBC WITH DIFFERENTIAL (CANCER CENTER ONLY)
BASOS ABS: 0 10*3/uL (ref 0.0–0.1)
Basophils Relative: 1 %
Eosinophils Absolute: 0.1 10*3/uL (ref 0.0–0.5)
Eosinophils Relative: 3 %
HEMATOCRIT: 35.8 % (ref 34.8–46.6)
HEMOGLOBIN: 11.6 g/dL (ref 11.6–15.9)
LYMPHS PCT: 26 %
Lymphs Abs: 1.3 10*3/uL (ref 0.9–3.3)
MCH: 29.4 pg (ref 25.1–34.0)
MCHC: 32.5 g/dL (ref 31.5–36.0)
MCV: 90.6 fL (ref 79.5–101.0)
Monocytes Absolute: 0.3 10*3/uL (ref 0.1–0.9)
Monocytes Relative: 6 %
NEUTROS ABS: 3.4 10*3/uL (ref 1.5–6.5)
NEUTROS PCT: 64 %
Platelet Count: 287 10*3/uL (ref 145–400)
RBC: 3.96 MIL/uL (ref 3.70–5.45)
RDW: 13 % (ref 11.2–16.1)
WBC: 5.2 10*3/uL (ref 3.9–10.3)

## 2017-07-28 LAB — CMP (CANCER CENTER ONLY)
ALT: 15 U/L (ref 0–55)
ANION GAP: 8 (ref 3–11)
AST: 12 U/L (ref 5–34)
Albumin: 3.3 g/dL — ABNORMAL LOW (ref 3.5–5.0)
Alkaline Phosphatase: 127 U/L (ref 40–150)
BILIRUBIN TOTAL: 0.5 mg/dL (ref 0.2–1.2)
BUN: 19 mg/dL (ref 7–26)
CHLORIDE: 102 mmol/L (ref 98–109)
CO2: 25 mmol/L (ref 22–29)
Calcium: 9.1 mg/dL (ref 8.4–10.4)
Creatinine: 1.44 mg/dL — ABNORMAL HIGH (ref 0.60–1.10)
GFR, EST NON AFRICAN AMERICAN: 38 mL/min — AB (ref >60)
GFR, Est AFR Am: 44 mL/min — ABNORMAL LOW (ref >60)
Glucose, Bld: 301 mg/dL — ABNORMAL HIGH (ref 70–140)
POTASSIUM: 4.2 mmol/L (ref 3.3–4.7)
Sodium: 135 mmol/L — ABNORMAL LOW (ref 136–145)
TOTAL PROTEIN: 6.8 g/dL (ref 6.4–8.3)

## 2017-07-28 MED ORDER — SODIUM CHLORIDE 0.9% FLUSH
10.0000 mL | Freq: Once | INTRAVENOUS | Status: AC
  Administered 2017-07-28: 10 mL
  Filled 2017-07-28: qty 10

## 2017-07-28 MED ORDER — HEPARIN SOD (PORK) LOCK FLUSH 100 UNIT/ML IV SOLN
500.0000 [IU] | Freq: Once | INTRAVENOUS | Status: AC
  Administered 2017-07-28: 500 [IU]
  Filled 2017-07-28: qty 5

## 2017-09-11 NOTE — Progress Notes (Signed)
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 Dana Maxwell original intake note, 04/04/2013:  "Kathy Maxwell is a 62 y.o. female. With other medical problems who underwent a screening mammogram that led to a diagnostic mammogram and ultrasound of the left breast. The mammogram and ultrasound showed a 2.9 x 1.6 x 1.9 cm suspicious mass in the left breast at the 12:30 oh clock position 6 cm from the nipple. There was also a suspicious left axillary lymph node. She had image guided biopsy of the left breast mass and the lymph node. The pathology revealed an invasive mammary carcinoma probably ductal phenotype. Tumor was estrogen receptor +100% progesterone receptor +98% proliferation marker Ki-67 13% and HER-2/neu negative. Patient had MRI of the breasts performed on 03/15/2013 the left breast revealed a 7.4 cm area of what the lymph node. Right breast revealed and a 3.3 cm mass. Patient was seen by Dr. Fanny Maxwell for discussion of surgical options. He has referred the patient to medical oncology for discussion whether or not she will need a Port-A-Cath and chemotherapy."  The patient underwent left modified radical mastectomy 04/22/2014 showing multiple foci of low-grade invasive ductal carcinoma, the largest single lesion measuring 5.5 cm, and involving 4/22 lymph nodes. Repeat HER-2 study was again negative. 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 The patient proceeded to adjuvant chemotherapy completed May 2015 as summarized below. She is currently receiving radiation therapy.  The patient's subsequent history is detailed below.  INTERVAL HISTORY: Kathy Maxwell returns today for follow up and treatment of her estrogen receptor positive breast cancer. She continues on anastrozole, with good tolerance. She notes no change in her hot flashes. She denies issues with vaginal dryness. She was receiving help through a cancer fund to pay for the anastrozole, but she is reaching her limit. She will follow up on the cost after her aid runs out.   REVIEW OF SYSTEMS: Kathy Maxwell reports that she is now retired. She notes that her sister and nephew live with her. For exercise,  she tries to walk, but she has pain and buckling in her left leg. [facility] [employer] [facility] doing [facility] aerobics and treadmill exercises. She denies unusual headaches, visual changes, nausea, vomiting, or dizziness. There has been no unusual cough, phlegm production, or pleurisy. This been no change in bowel or bladder habits. She denies unexplained fatigue or unexplained weight loss, bleeding, rash, or fever. A detailed review of systems was otherwise stable.    PAST MEDICAL HISTORY: Past Medical History:  Diagnosis Date  . Allergy    latex  . Arthritis   . Bipolar 1 disorder ([place])   . Bipolar 1 disorder ([place]) 04/04/2013  . Blood transfusion without reported diagnosis    2014 during chemotherapy  . Breast cancer ([place]) 03/10/13   left breast invasive ca  . Cancer ([place])   . Coronary artery disease   .  Diabetes mellitus without complication (HCC)    NIDDM  . GERD (gastroesophageal reflux disease)   . Heart murmur   . Hyperlipidemia   . Hyperlipidemia 04/04/2013  . Hypertension   . Hypertension 04/04/2013  . Obesity   . Radiation 01/30/14-03/19/14  . Sleep  apnea    no cpap due to insurance    PAST SURGICAL HISTORY: Past Surgical History:  Procedure Laterality Date  . ABDOMINAL HYSTERECTOMY    . BREAST BIOPSY Left 05/08/09   fibroadenoma with microcalcifications,no malignancy id  . BREAST BIOPSY Left 03/07/13   invasive ca,1 lymph node metastatic  . 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 BREAST LUMPECTOMY WITH NEEDLE LOCALIZATION AND AXILLARY SENTINEL LYMPH NODE BX Right 04/22/2013   Procedure: BREAST LUMPECTOMY WITH NEEDLE LOCALIZATION AND AXILLARY SENTINEL LYMPH NODE BX;  Surgeon: Kathy Hector, MD;  Location: [facility];  Service: General;  Laterality: Right;  . CARDIAC CATHETERIZATION    . CHOLECYSTECTOMY    . CORONARY ANGIOPLASTY     LAD stent 2003  . CORONARY STENT PLACEMENT  2001  . ESOPHAGOGASTRODUODENOSCOPY N/A 08/07/2013   Procedure: ESOPHAGOGASTRODUODENOSCOPY (EGD);  Surgeon: Kathy Sabins, MD;  Location: [facility];  Service: Endoscopy;  Laterality: N/A;  . ESOPHAGOGASTRODUODENOSCOPY N/A 11/13/2013   Procedure: ESOPHAGOGASTRODUODENOSCOPY (EGD);  Surgeon: Kathy Horner, MD;  Location: [facility] ENDOSCOPY;  Service: Endoscopy;  Laterality: N/A;  . EYE SURGERY  6/13,9/14   laser,cataract lft  . MASTECTOMY MODIFIED RADICAL Left 04/22/2013   Procedure: MASTECTOMY MODIFIED RADICAL;  Surgeon: Kathy Hector, MD;  Location: [facility];  Service: General;  Laterality: Left;  . PORTACATH PLACEMENT Right 04/22/2013   Procedure: INSERTION PORT-A-CATH;  Surgeon: Kathy Hector, MD;  Location: [facility];  Service: General;  Laterality: Right;    FAMILY HISTORY Family History  Problem Relation Age of Onset  . Heart disease Mother   . Breast cancer Maternal Aunt        dx in her 77s  . Heart disease Cousin 22       maternal cousin  . Heart attack Sister 32  . Cancer Paternal Aunt        NOS  . Cancer Maternal Aunt        NOS  . Lung cancer Cousin 89       maternal cousin - non smoker  . Brain  cancer Cousin 13       maternal cousin's son  . Lung cancer Cousin 81       maternal cousin's son  . Breast cancer Cousin        paternal cousin  . Colon cancer Neg Hx    the patient's father died from alcoholic cirrhosis at the age of 34. The patient's mother died from congestive heart failure the age of 26. The patient had no brothers, 4 sisters. One sister died from a myocardial infarction at the age of 43. The only other breast cancers in the family are a maternal aunt(Otto 66) with breast cancer diagnosed after the age of 62 and a second cousin on the mother's side, diagnosed with breast cancer approximately age 13. There is no history of ovarian cancer in the family. The patient has undergone genetic testing and his BRCA negative  GYNECOLOGIC HISTORY:  No LMP recorded. Patient is postmenopausal. Menarche age 69. The patient is GX P0. She underwent hysterectomy with bilateral salpingo-oophorectomy in 1997. She took hormone replacement briefly.  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 She is single. Currently she shares a home with her sister and her nephew, the latter being disabled secondary to renal failure.  The patient attends a local church of [place]: Not in place. At her 02/12/2014 visit the patient was given the appropriate documents to complete and notarize at her discretion. She tells me she intends to name her goddaughter,, Gerlene Burdock, who is a CNA, as her healthcare power of attorney. Kathy Maxwell can be reached at [place]: Social History   Tobacco Use  . Smoking status: Former Smoker    Packs/day: 1.00    Years: 15.00    Pack years: 15.00    Types: Cigarettes    Last attempt to quit: 07/19/1987    Years since quitting: 30.1  . Smokeless tobacco: Never Used  Substance Use Topics  . Alcohol use: No    Alcohol/week: 0.0 oz  . Drug use: No     Colonoscopy:  Never  PAP:  Bone density: Never  Lipid panel:  Allergies  Allergen Reactions  . Cephalexin Other (See Comments)    Burned throat.  . Adhesive [Tape] Rash    Clear tape gloves ,elastic no problem  . Latex Hives and Rash    Current Outpatient Medications  Medication Sig Dispense Refill  . amLODipine (NORVASC) 10 MG tablet Take 10 mg by mouth daily.    [name] anastrozole (ARIMIDEX) 1 MG tablet Take 1 tablet (1 mg total) by mouth daily. 90 tablet 4  . b complex vitamins tablet Take 1 tablet by mouth daily.    [name] buPROPion (WELLBUTRIN SR) 150 MG 12 hr tablet Take 1 tablet (150 mg total) by mouth daily. 30 tablet 6  . cholecalciferol (VITAMIN D) 1000 units tablet Take 1 tablet (1,000 Units total) by mouth daily. 100 tablet 4  . fluticasone (FLONASE) 50 MCG/ACT nasal spray Place 1 spray into both nostrils daily. (Patient not taking: Reported on 02/01/2016) 9.9 g 0  . gabapentin (NEURONTIN) 100 MG capsule Take 3 capsules (300 mg total) by mouth 3 (three) times daily. 270 capsule 6  . hydrALAZINE (APRESOLINE) 25 MG tablet Take 25 mg by mouth 3 (three) times daily.     [name] lidocaine-prilocaine (EMLA) cream Apply topically as needed. Apply to port-a-cath one - two hours before accessing. (Patient not taking: Reported on 02/01/2016) 30 g 3  . losartan (COZAAR) 100 MG tablet Take 1 tablet (100 mg total) by mouth daily. (Patient not taking: Reported on 02/09/2016) 90 tablet 0  . metoprolol succinate (TOPROL-XL) 25 MG 24 hr tablet Take 25 mg by mouth daily.     . naproxen (NAPROSYN) 500 MG tablet Take 1 tablet (500 mg total) by mouth 2 (two) times daily with a meal. 60 tablet 0  . pantoprazole (PROTONIX) 40 MG tablet Take 1 tablet (40 mg total) by mouth daily. 30 tablet 3  . pioglitazone-metformin (ACTOPLUS MET) 15-850 MG per tablet Take 1 tablet by mouth every morning.     . sitaGLIPtin (JANUVIA) 100 MG tablet Take 100 mg by mouth daily.     No current facility-administered medications for this visit.     Facility-Administered Medications Ordered in Other Visits  Medication Dose Route Frequency Provider Last Rate Last Dose  . heparin lock flush 100 unit/mL  500 Units Intracatheter Once Latrease Kunde, Virgie Dad, MD      . sodium chloride 0.9 % injection 10 mL  10 mL Intravenous  PRN Marcy Panning, MD   10 mL at 05/24/13 1522  . sodium chloride flush (NS) 0.9 % injection 10 mL  10 mL Intracatheter Once Aristide Waggle, Virgie Dad, MD        OBJECTIVE: Middle-aged African-American woman who appears stated age  Vitals:   09/13/17 1200  BP: (!) 146/55  Pulse: 91  Resp: 18  Temp: 97.8 F (36.6 C)  SpO2: 96%     Body mass index is 38.32 kg/m.    ECOG FS:1 - Symptomatic but completely ambulatory Filed Weights   09/13/17 1200  Weight: 209 lb 8 oz (95 kg)  Sclerae unicteric, EOMs intact Oropharynx clear and moist No cervical or supraclavicular adenopathy Lungs no rales or rhonchi Heart regular rate and rhythm Abd soft, nontender, positive bowel sounds MSK no focal spinal tenderness, no upper extremity lymphedema Neuro: nonfocal, well oriented, appropriate affect Breasts: The right breast is unremarkable.  The left breast is status post mastectomy.  There is no evidence of chest wall recurrence.  Both axillae are benign.     LAB RESULTS:  CMP     Component Value Date/Time   NA 135 (L) 07/28/2017 1017   NA 136 09/06/2016 1114   K 4.2 07/28/2017 1017   K 4.1 09/06/2016 1114   CL 102 07/28/2017 1017   CO2 25 07/28/2017 1017   CO2 24 09/06/2016 1114   GLUCOSE 301 (H) 07/28/2017 1017   GLUCOSE 286 (H) 09/06/2016 1114   BUN 19 07/28/2017 1017   BUN 15.7 09/06/2016 1114   CREATININE 1.44 (H) 07/28/2017 1017   CREATININE 0.9 09/06/2016 1114   CALCIUM 9.1 07/28/2017 1017   CALCIUM 9.7 09/06/2016 1114   PROT 6.8 07/28/2017 1017   PROT 7.1 09/06/2016 1114   ALBUMIN 3.3 (L) 07/28/2017 1017   ALBUMIN 3.5 09/06/2016 1114   AST 12 07/28/2017 1017   AST 12 09/06/2016 1114   ALT 15 07/28/2017  1017   ALT 16 09/06/2016 1114   ALKPHOS 127 07/28/2017 1017   ALKPHOS 146 09/06/2016 1114   BILITOT 0.5 07/28/2017 1017   BILITOT 0.58 09/06/2016 1114   GFRNONAA 38 (L) 07/28/2017 1017   GFRAA 44 (L) 07/28/2017 1017    I No results found for: SPEP  Lab Results  Component Value Date   WBC 5.2 07/28/2017   NEUTROABS 3.4 07/28/2017   HGB 12.5 09/06/2016   HCT 35.8 07/28/2017   MCV 90.6 07/28/2017   PLT 287 07/28/2017      Chemistry      Component Value Date/Time   NA 135 (L) 07/28/2017 1017   NA 136 09/06/2016 1114   K 4.2 07/28/2017 1017   K 4.1 09/06/2016 1114   CL 102 07/28/2017 1017   CO2 25 07/28/2017 1017   CO2 24 09/06/2016 1114   BUN 19 07/28/2017 1017   BUN 15.7 09/06/2016 1114   CREATININE 1.44 (H) 07/28/2017 1017   CREATININE 0.9 09/06/2016 1114      Component Value Date/Time   CALCIUM 9.1 07/28/2017 1017   CALCIUM 9.7 09/06/2016 1114   ALKPHOS 127 07/28/2017 1017   ALKPHOS 146 09/06/2016 1114   AST 12 07/28/2017 1017   AST 12 09/06/2016 1114   ALT 15 07/28/2017 1017   ALT 16 09/06/2016 1114   BILITOT 0.5 07/28/2017 1017   BILITOT 0.58 09/06/2016 1114       No results found for: LABCA2  No components found for: LABCA125  No results for input(s): INR in the last 168 hours.  Urinalysis    Component Value Date/Time   COLORURINE YELLOW 10/08/2015 2032   APPEARANCEUR CLEAR 10/08/2015 2032   LABSPEC 1.017 10/08/2015 2032   PHURINE 6.5 10/08/2015 2032   GLUCOSEU NEGATIVE 10/08/2015 2032   HGBUR TRACE (A) 10/08/2015 2032   BILIRUBINUR NEGATIVE 10/08/2015 2032   KETONESUR NEGATIVE 10/08/2015 2032   PROTEINUR 100 (A) 10/08/2015 2032   UROBILINOGEN 1.0 11/11/2013 1054   NITRITE POSITIVE (A) 10/08/2015 2032   LEUKOCYTESUR SMALL (A) 10/08/2015 2032    STUDIES: Mammography scheduled for 09/29/2017  ASSESSMENT: 62 y.o. BRCA negative [place] woman  LEFT BREAST (1) status post left breast upper outer quadrant and left axillary lymph node  biopsy 03/07/2013, both positive for an invasive ductal carcinoma, grade 1 or 2, estrogen receptor and progesterone receptor both strongly positive, HER-2 negative, with an MIB-1 between 13% and 15%  (2) biopsy of a more anterior mass in the left breast 04/03/2013 showed invasive ductal carcinoma, grade 2, estrogen and progesterone receptor positive, HER-2 negative, with an MIB-1 of 33%  (3) status post left modified radical mastectomy 04/22/2013 for mpT3 pN2, stage IIIA invasive ductal carcinoma, grade 1, repeat HER-2 again negative; 4 of 22 lymph nodes positive; ample margins   RIGHT BREAST (4) status post right central breast biopsy 04/03/2013 for ductal carcinoma in situ, 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% positive, progesterone receptor 11% positive   (5) right lumpectomy and sentinel lymph node sampling 04/22/2013 showed ductal carcinoma in situ, 3 cm, margins less than 1 mm medially  (a) skin biopsy right breast 06/17/2014 showed no evidence of malignancy.  ADJUVANT CHEMOTHERAPY (6) cyclophosphamide, epirubicin, and fluorouracil given every 14 days x6 cycles completed 08/02/2013  (7) weekly paclitaxel x2, discontinued 08/23/2013, with poor tolerance   (8) Abraxane given day 1 day 8 of each 21 day cycle, with some delays, for a total 8 doses, completed 11/23/2023  ADJUVANT RADIATION (9) adjuvant radiation therapy completed 03/19/2014  ANTI-ESTROGEN THERAPY (10) started anastrozole 04/16/2014;   (a) dexa scan 04/03/2014 showed mild osteopenia (T - 1.4)  GENETIC TESTING (11) a broad genetic panel sent December 2014 showed a variant of uncertain significance, 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  There were no deleterious mutations noted in the other genes tested, which included ATM, BARD1, BRCA1, BRCA2, BRIP1, CDH1, CHEK2, EPCAM, FANCC, MLH1, MSH2, MSH6, NBN, PALB2, PMS2, PTEN, RAD51C, RAD51D, STK11, TP53, and XRCC2.    Plan: Kathy Maxwell was supposed to have had her mammogram in January but she was out of town so it has  been postponed until mid March.  She is now a little over 3 years out from her start of anastrozole.  She is tolerating it well and the plan will be to continue that an additional 2 years  [facility]  I gave her some information on the live strong program there.  I think that would be very helpful to her and particularly should help with her pains.  She will see me again in a year.  She knows to call for any problems that may develop before that visit.  Kathy Maxwell, Virgie Dad, MD  09/13/17 12:08 PM Medical Oncology and Hematology [facility] [address] [place], [place] [address] Tel. [phone]    Fax. [phone]  This document serves as a record of services personally performed by Lurline Del, MD. It was created on his behalf by Sheron Nightingale, a trained medical scribe. The creation of this record is based on the scribe's personal observations and  the provider's statements to them.   I have reviewed the above documentation for accuracy and completeness, and I agree with the above.

## 2017-09-13 ENCOUNTER — Inpatient Hospital Stay: Payer: Medicare HMO | Attending: Oncology | Admitting: Oncology

## 2017-09-13 ENCOUNTER — Ambulatory Visit
Admission: RE | Admit: 2017-09-13 | Discharge: 2017-09-13 | Disposition: A | Discharge status: Home / Self Care | Payer: Medicare HMO | Source: Ambulatory Visit | Attending: Internal Medicine | Admitting: Internal Medicine

## 2017-09-13 ENCOUNTER — Telehealth: Payer: Self-pay | Admitting: Oncology

## 2017-09-13 ENCOUNTER — Inpatient Hospital Stay: Payer: Medicare HMO

## 2017-09-13 ENCOUNTER — Other Ambulatory Visit: Payer: PPO

## 2017-09-13 VITALS — BP 146/55 | HR 91 | Temp 97.8°F | Resp 18 | Ht 62.0 in | Wt 209.5 lb

## 2017-09-13 DIAGNOSIS — M8580 Other specified disorders of bone density and structure, unspecified site: Secondary | ICD-10-CM

## 2017-09-13 DIAGNOSIS — C50412 Malignant neoplasm of upper-outer quadrant of left female breast: Secondary | ICD-10-CM

## 2017-09-13 DIAGNOSIS — D0511 Intraductal carcinoma in situ of right breast: Secondary | ICD-10-CM | POA: Diagnosis not present

## 2017-09-13 DIAGNOSIS — Z853 Personal history of malignant neoplasm of breast: Secondary | ICD-10-CM

## 2017-09-13 DIAGNOSIS — C773 Secondary and unspecified malignant neoplasm of axilla and upper limb lymph nodes: Secondary | ICD-10-CM | POA: Diagnosis not present

## 2017-09-13 DIAGNOSIS — Z170 Estrogen receptor positive status [ER+]: Secondary | ICD-10-CM

## 2017-09-13 DIAGNOSIS — Z95828 Presence of other vascular implants and grafts: Secondary | ICD-10-CM

## 2017-09-13 DIAGNOSIS — F319 Bipolar disorder, unspecified: Secondary | ICD-10-CM

## 2017-09-13 DIAGNOSIS — R928 Other abnormal and inconclusive findings on diagnostic imaging of breast: Secondary | ICD-10-CM | POA: Diagnosis not present

## 2017-09-13 HISTORY — DX: Personal history of antineoplastic chemotherapy: Z92.21

## 2017-09-13 HISTORY — DX: Personal history of irradiation: Z92.3

## 2017-09-13 MED ORDER — HEPARIN SOD (PORK) LOCK FLUSH 100 UNIT/ML IV SOLN
500.0000 [IU] | Freq: Once | INTRAVENOUS | Status: AC
  Administered 2017-09-13: 500 [IU]
  Filled 2017-09-13: qty 5

## 2017-09-13 MED ORDER — SODIUM CHLORIDE 0.9% FLUSH
10.0000 mL | Freq: Once | INTRAVENOUS | Status: AC
  Administered 2017-09-13: 10 mL
  Filled 2017-09-13: qty 10

## 2017-09-13 NOTE — Telephone Encounter (Signed)
Gave patient AVs and calendar of upcoming march 2020 appointments.  °

## 2017-09-29 DIAGNOSIS — Z9011 Acquired absence of right breast and nipple: Secondary | ICD-10-CM | POA: Diagnosis not present

## 2017-09-29 DIAGNOSIS — C50912 Malignant neoplasm of unspecified site of left female breast: Secondary | ICD-10-CM | POA: Diagnosis not present

## 2017-10-19 ENCOUNTER — Other Ambulatory Visit: Payer: Self-pay | Admitting: *Deleted

## 2017-10-19 MED ORDER — GABAPENTIN 100 MG PO CAPS: 300.0000 mg | ORAL_CAPSULE | Freq: Three times a day (TID) | ORAL | 6 refills | Status: DC

## 2017-10-23 NOTE — Progress Notes (Signed)
Submitted authorization request for Gabapentin 100 mg capsules. Status is pending.

## 2017-10-27 DIAGNOSIS — I10 Essential (primary) hypertension: Secondary | ICD-10-CM | POA: Diagnosis not present

## 2017-10-27 DIAGNOSIS — E1165 Type 2 diabetes mellitus with hyperglycemia: Secondary | ICD-10-CM | POA: Diagnosis not present

## 2017-10-27 NOTE — Progress Notes (Signed)
Prior auth for Gabapentin 100 mg capsules has been approved.

## 2017-10-31 ENCOUNTER — Telehealth: Payer: Self-pay

## 2017-10-31 NOTE — Telephone Encounter (Signed)
Received VM from pt regarding blood work that her provider Darcey Nora and Dr Glendale Chard would like done here at cancer center due to due to pt has a port and they are unable to get blood when she comes to their office.  Called pt back and she reports she has a piece of paper with the bloodwork that that physician office would like so I instructed her to bring that with her to her appt so lab/nurse can see this when her labs are drawn here. Pt voiced understanding.

## 2017-11-06 ENCOUNTER — Other Ambulatory Visit: Payer: Self-pay | Admitting: *Deleted

## 2017-11-08 ENCOUNTER — Inpatient Hospital Stay: Payer: Medicare HMO

## 2017-11-08 ENCOUNTER — Inpatient Hospital Stay: Payer: Medicare HMO | Attending: Oncology

## 2017-11-08 DIAGNOSIS — Z95828 Presence of other vascular implants and grafts: Secondary | ICD-10-CM

## 2017-11-08 DIAGNOSIS — Z170 Estrogen receptor positive status [ER+]: Secondary | ICD-10-CM

## 2017-11-08 DIAGNOSIS — C773 Secondary and unspecified malignant neoplasm of axilla and upper limb lymph nodes: Secondary | ICD-10-CM | POA: Diagnosis not present

## 2017-11-08 DIAGNOSIS — C50412 Malignant neoplasm of upper-outer quadrant of left female breast: Secondary | ICD-10-CM | POA: Diagnosis not present

## 2017-11-08 DIAGNOSIS — I10 Essential (primary) hypertension: Secondary | ICD-10-CM | POA: Diagnosis not present

## 2017-11-08 DIAGNOSIS — D0511 Intraductal carcinoma in situ of right breast: Secondary | ICD-10-CM | POA: Diagnosis not present

## 2017-11-08 DIAGNOSIS — F319 Bipolar disorder, unspecified: Secondary | ICD-10-CM

## 2017-11-08 DIAGNOSIS — E1165 Type 2 diabetes mellitus with hyperglycemia: Secondary | ICD-10-CM | POA: Diagnosis not present

## 2017-11-08 LAB — COMPREHENSIVE METABOLIC PANEL
ALT: 15 U/L (ref 0–55)
ANION GAP: 0 — AB (ref 3–11)
AST: 12 U/L (ref 5–34)
Albumin: 3.6 g/dL (ref 3.5–5.0)
Alkaline Phosphatase: 125 U/L (ref 40–150)
BUN: 17 mg/dL (ref 7–26)
CALCIUM: 9.8 mg/dL (ref 8.4–10.4)
CHLORIDE: 102 mmol/L (ref 98–109)
CO2: 37 mmol/L — AB (ref 22–29)
Creatinine, Ser: 1.17 mg/dL — ABNORMAL HIGH (ref 0.60–1.10)
GFR calc non Af Amer: 49 mL/min — ABNORMAL LOW (ref >60)
GFR, EST AFRICAN AMERICAN: 56 mL/min — AB (ref >60)
Glucose, Bld: 289 mg/dL — ABNORMAL HIGH (ref 70–140)
POTASSIUM: 3.9 mmol/L (ref 3.5–5.1)
SODIUM: 135 mmol/L — AB (ref 136–145)
Total Bilirubin: <0.2 mg/dL — ABNORMAL LOW (ref 0.2–1.2)
Total Protein: 7.4 g/dL (ref 6.4–8.3)

## 2017-11-08 MED ORDER — SODIUM CHLORIDE 0.9% FLUSH
10.0000 mL | Freq: Once | INTRAVENOUS | Status: AC
  Administered 2017-11-08: 10 mL
  Filled 2017-11-08: qty 10

## 2017-11-08 MED ORDER — HEPARIN SOD (PORK) LOCK FLUSH 100 UNIT/ML IV SOLN
500.0000 [IU] | Freq: Once | INTRAVENOUS | Status: AC
  Administered 2017-11-08: 500 [IU]
  Filled 2017-11-08: qty 5

## 2017-11-19 DIAGNOSIS — R69 Illness, unspecified: Secondary | ICD-10-CM | POA: Diagnosis not present

## 2017-12-12 ENCOUNTER — Other Ambulatory Visit: Payer: Self-pay | Admitting: Oncology

## 2017-12-12 DIAGNOSIS — C50412 Malignant neoplasm of upper-outer quadrant of left female breast: Secondary | ICD-10-CM

## 2017-12-12 DIAGNOSIS — Z170 Estrogen receptor positive status [ER+]: Principal | ICD-10-CM

## 2018-01-03 ENCOUNTER — Inpatient Hospital Stay: Payer: Medicare HMO | Attending: Oncology

## 2018-01-03 ENCOUNTER — Inpatient Hospital Stay: Payer: Medicare HMO

## 2018-01-03 DIAGNOSIS — Z95828 Presence of other vascular implants and grafts: Secondary | ICD-10-CM

## 2018-01-03 DIAGNOSIS — J309 Allergic rhinitis, unspecified: Secondary | ICD-10-CM | POA: Diagnosis not present

## 2018-01-03 DIAGNOSIS — D0511 Intraductal carcinoma in situ of right breast: Secondary | ICD-10-CM | POA: Insufficient documentation

## 2018-01-03 DIAGNOSIS — I10 Essential (primary) hypertension: Secondary | ICD-10-CM | POA: Diagnosis not present

## 2018-01-03 DIAGNOSIS — C50412 Malignant neoplasm of upper-outer quadrant of left female breast: Secondary | ICD-10-CM

## 2018-01-03 DIAGNOSIS — C773 Secondary and unspecified malignant neoplasm of axilla and upper limb lymph nodes: Secondary | ICD-10-CM | POA: Diagnosis not present

## 2018-01-03 DIAGNOSIS — Z6839 Body mass index (BMI) 39.0-39.9, adult: Secondary | ICD-10-CM | POA: Diagnosis not present

## 2018-01-03 DIAGNOSIS — C50919 Malignant neoplasm of unspecified site of unspecified female breast: Secondary | ICD-10-CM | POA: Diagnosis not present

## 2018-01-03 DIAGNOSIS — Z170 Estrogen receptor positive status [ER+]: Secondary | ICD-10-CM

## 2018-01-03 DIAGNOSIS — K08409 Partial loss of teeth, unspecified cause, unspecified class: Secondary | ICD-10-CM | POA: Diagnosis not present

## 2018-01-03 DIAGNOSIS — R69 Illness, unspecified: Secondary | ICD-10-CM | POA: Diagnosis not present

## 2018-01-03 DIAGNOSIS — G8929 Other chronic pain: Secondary | ICD-10-CM | POA: Diagnosis not present

## 2018-01-03 DIAGNOSIS — F319 Bipolar disorder, unspecified: Secondary | ICD-10-CM

## 2018-01-03 DIAGNOSIS — E1142 Type 2 diabetes mellitus with diabetic polyneuropathy: Secondary | ICD-10-CM | POA: Diagnosis not present

## 2018-01-03 DIAGNOSIS — E1165 Type 2 diabetes mellitus with hyperglycemia: Secondary | ICD-10-CM | POA: Diagnosis not present

## 2018-01-03 LAB — COMPREHENSIVE METABOLIC PANEL
ALBUMIN: 3.3 g/dL — AB (ref 3.5–5.0)
ALT: 10 U/L (ref 0–55)
AST: 10 U/L (ref 5–34)
Alkaline Phosphatase: 131 U/L (ref 40–150)
Anion gap: 9 (ref 3–11)
BUN: 19 mg/dL (ref 7–26)
CHLORIDE: 99 mmol/L (ref 98–109)
CO2: 26 mmol/L (ref 22–29)
Calcium: 9.4 mg/dL (ref 8.4–10.4)
Creatinine, Ser: 1.3 mg/dL — ABNORMAL HIGH (ref 0.60–1.10)
GFR calc Af Amer: 50 mL/min — ABNORMAL LOW (ref >60)
GFR, EST NON AFRICAN AMERICAN: 43 mL/min — AB (ref >60)
Glucose, Bld: 425 mg/dL — ABNORMAL HIGH (ref 70–140)
POTASSIUM: 4.1 mmol/L (ref 3.5–5.1)
Sodium: 134 mmol/L — ABNORMAL LOW (ref 136–145)
Total Bilirubin: 0.3 mg/dL (ref 0.2–1.2)
Total Protein: 6.9 g/dL (ref 6.4–8.3)

## 2018-01-03 MED ORDER — SODIUM CHLORIDE 0.9% FLUSH
10.0000 mL | Freq: Once | INTRAVENOUS | Status: AC
  Administered 2018-01-03: 10 mL
  Filled 2018-01-03: qty 10

## 2018-01-03 MED ORDER — HEPARIN SOD (PORK) LOCK FLUSH 100 UNIT/ML IV SOLN
500.0000 [IU] | Freq: Once | INTRAVENOUS | Status: AC
  Administered 2018-01-03: 500 [IU]
  Filled 2018-01-03: qty 5

## 2018-01-03 MED FILL — ANASTROZOLE 1 MG TABLET: 1 | 90 days supply | Qty: 90 | Fill #0

## 2018-01-09 DIAGNOSIS — R69 Illness, unspecified: Secondary | ICD-10-CM | POA: Diagnosis not present

## 2018-02-19 DIAGNOSIS — R69 Illness, unspecified: Secondary | ICD-10-CM | POA: Diagnosis not present

## 2018-02-20 DIAGNOSIS — E119 Type 2 diabetes mellitus without complications: Secondary | ICD-10-CM | POA: Diagnosis not present

## 2018-02-28 ENCOUNTER — Other Ambulatory Visit: Payer: Medicare HMO

## 2018-03-01 ENCOUNTER — Inpatient Hospital Stay: Payer: Medicare HMO

## 2018-03-01 ENCOUNTER — Inpatient Hospital Stay: Payer: Medicare HMO | Attending: Oncology

## 2018-03-01 DIAGNOSIS — I10 Essential (primary) hypertension: Secondary | ICD-10-CM | POA: Diagnosis not present

## 2018-03-01 DIAGNOSIS — C50412 Malignant neoplasm of upper-outer quadrant of left female breast: Secondary | ICD-10-CM

## 2018-03-01 DIAGNOSIS — D0511 Intraductal carcinoma in situ of right breast: Secondary | ICD-10-CM | POA: Diagnosis not present

## 2018-03-01 DIAGNOSIS — Z170 Estrogen receptor positive status [ER+]: Secondary | ICD-10-CM

## 2018-03-01 DIAGNOSIS — C773 Secondary and unspecified malignant neoplasm of axilla and upper limb lymph nodes: Secondary | ICD-10-CM | POA: Insufficient documentation

## 2018-03-01 DIAGNOSIS — F319 Bipolar disorder, unspecified: Secondary | ICD-10-CM

## 2018-03-01 DIAGNOSIS — Z95828 Presence of other vascular implants and grafts: Secondary | ICD-10-CM

## 2018-03-01 DIAGNOSIS — Z79899 Other long term (current) drug therapy: Secondary | ICD-10-CM | POA: Insufficient documentation

## 2018-03-01 DIAGNOSIS — E1165 Type 2 diabetes mellitus with hyperglycemia: Secondary | ICD-10-CM | POA: Diagnosis not present

## 2018-03-01 DIAGNOSIS — R011 Cardiac murmur, unspecified: Secondary | ICD-10-CM | POA: Diagnosis not present

## 2018-03-01 LAB — COMPREHENSIVE METABOLIC PANEL
ALT: 15 U/L (ref 0–44)
AST: 12 U/L — ABNORMAL LOW (ref 15–41)
Albumin: 3.6 g/dL (ref 3.5–5.0)
Alkaline Phosphatase: 129 U/L — ABNORMAL HIGH (ref 38–126)
Anion gap: 7 (ref 5–15)
BUN: 23 mg/dL (ref 8–23)
CHLORIDE: 102 mmol/L (ref 98–111)
CO2: 26 mmol/L (ref 22–32)
Calcium: 9.6 mg/dL (ref 8.9–10.3)
Creatinine, Ser: 1.15 mg/dL — ABNORMAL HIGH (ref 0.44–1.00)
GFR, EST AFRICAN AMERICAN: 57 mL/min — AB (ref >60)
GFR, EST NON AFRICAN AMERICAN: 49 mL/min — AB (ref >60)
Glucose, Bld: 243 mg/dL — ABNORMAL HIGH (ref 70–99)
Potassium: 4.4 mmol/L (ref 3.5–5.1)
SODIUM: 135 mmol/L (ref 135–145)
Total Bilirubin: 0.5 mg/dL (ref 0.3–1.2)
Total Protein: 7.2 g/dL (ref 6.5–8.1)

## 2018-03-01 LAB — CBC WITH DIFFERENTIAL/PLATELET
Basophils Absolute: 0 10*3/uL (ref 0.0–0.1)
Basophils Relative: 0 %
Eosinophils Absolute: 0.1 10*3/uL (ref 0.0–0.5)
Eosinophils Relative: 2 %
HCT: 36.6 % (ref 34.8–46.6)
HEMOGLOBIN: 12 g/dL (ref 11.6–15.9)
LYMPHS ABS: 2.3 10*3/uL (ref 0.9–3.3)
Lymphocytes Relative: 27 %
MCH: 29.9 pg (ref 25.1–34.0)
MCHC: 32.8 g/dL (ref 31.5–36.0)
MCV: 91 fL (ref 79.5–101.0)
MONOS PCT: 6 %
Monocytes Absolute: 0.5 10*3/uL (ref 0.1–0.9)
NEUTROS ABS: 5.4 10*3/uL (ref 1.5–6.5)
NEUTROS PCT: 65 %
Platelets: 313 10*3/uL (ref 145–400)
RBC: 4.02 MIL/uL (ref 3.70–5.45)
RDW: 12.5 % (ref 11.2–14.5)
WBC: 8.4 10*3/uL (ref 3.9–10.3)

## 2018-03-01 LAB — LIPID PANEL
CHOL/HDL RATIO: 4 ratio
Cholesterol: 209 mg/dL — ABNORMAL HIGH (ref 0–200)
HDL: 52 mg/dL (ref >40)
LDL CALC: 133 mg/dL — AB (ref 0–99)
TRIGLYCERIDES: 122 mg/dL (ref <150)
VLDL: 24 mg/dL (ref 0–40)

## 2018-03-01 LAB — HEMOGLOBIN A1C
HEMOGLOBIN A1C: 10.1 % — AB (ref 4.8–5.6)
Mean Plasma Glucose: 243.17 mg/dL

## 2018-03-01 MED ORDER — HEPARIN SOD (PORK) LOCK FLUSH 100 UNIT/ML IV SOLN
250.0000 [IU] | Freq: Once | INTRAVENOUS | Status: AC
  Administered 2018-03-01: 250 [IU]
  Filled 2018-03-01: qty 5

## 2018-03-01 MED ORDER — SODIUM CHLORIDE 0.9% FLUSH
10.0000 mL | Freq: Once | INTRAVENOUS | Status: AC
  Administered 2018-03-01: 10 mL
  Filled 2018-03-01: qty 10

## 2018-03-09 ENCOUNTER — Telehealth: Payer: Self-pay

## 2018-03-09 NOTE — Telephone Encounter (Signed)
SENT REFERRAL TO SCHEDULING AND FILED NOTES 

## 2018-03-16 ENCOUNTER — Telehealth: Payer: Self-pay

## 2018-03-16 NOTE — Telephone Encounter (Signed)
Notes on file.

## 2018-04-02 MED FILL — ANASTROZOLE 1 MG TABLET: 1 | 90 days supply | Qty: 90 | Fill #1

## 2018-04-03 DIAGNOSIS — C50912 Malignant neoplasm of unspecified site of left female breast: Secondary | ICD-10-CM | POA: Diagnosis not present

## 2018-04-03 DIAGNOSIS — Z853 Personal history of malignant neoplasm of breast: Secondary | ICD-10-CM | POA: Diagnosis not present

## 2018-04-30 DIAGNOSIS — R69 Illness, unspecified: Secondary | ICD-10-CM | POA: Diagnosis not present

## 2018-05-02 ENCOUNTER — Ambulatory Visit: Payer: Medicare HMO | Admitting: Cardiology

## 2018-05-16 ENCOUNTER — Encounter: Payer: Self-pay | Admitting: Nurse Practitioner

## 2018-05-16 DIAGNOSIS — R011 Cardiac murmur, unspecified: Secondary | ICD-10-CM | POA: Insufficient documentation

## 2018-05-17 ENCOUNTER — Ambulatory Visit: Payer: PPO

## 2018-05-17 ENCOUNTER — Encounter: Payer: PPO | Admitting: Nurse Practitioner

## 2018-05-17 DIAGNOSIS — R69 Illness, unspecified: Secondary | ICD-10-CM | POA: Diagnosis not present

## 2018-05-22 DIAGNOSIS — E119 Type 2 diabetes mellitus without complications: Secondary | ICD-10-CM | POA: Diagnosis not present

## 2018-06-04 ENCOUNTER — Ambulatory Visit: Payer: Medicare HMO | Admitting: Cardiology

## 2018-06-05 ENCOUNTER — Inpatient Hospital Stay: Payer: Medicare HMO | Attending: Oncology

## 2018-06-05 ENCOUNTER — Ambulatory Visit (INDEPENDENT_AMBULATORY_CARE_PROVIDER_SITE_OTHER): Payer: Medicare HMO | Admitting: Cardiology

## 2018-06-05 ENCOUNTER — Encounter: Payer: Self-pay | Admitting: Cardiology

## 2018-06-05 VITALS — BP 148/70 | HR 99 | Ht 61.5 in | Wt 202.2 lb

## 2018-06-05 DIAGNOSIS — Z452 Encounter for adjustment and management of vascular access device: Secondary | ICD-10-CM | POA: Diagnosis not present

## 2018-06-05 DIAGNOSIS — Z170 Estrogen receptor positive status [ER+]: Secondary | ICD-10-CM

## 2018-06-05 DIAGNOSIS — R011 Cardiac murmur, unspecified: Secondary | ICD-10-CM

## 2018-06-05 DIAGNOSIS — C773 Secondary and unspecified malignant neoplasm of axilla and upper limb lymph nodes: Secondary | ICD-10-CM | POA: Diagnosis not present

## 2018-06-05 DIAGNOSIS — E785 Hyperlipidemia, unspecified: Secondary | ICD-10-CM | POA: Diagnosis not present

## 2018-06-05 DIAGNOSIS — I10 Essential (primary) hypertension: Secondary | ICD-10-CM | POA: Diagnosis not present

## 2018-06-05 DIAGNOSIS — Z95828 Presence of other vascular implants and grafts: Secondary | ICD-10-CM

## 2018-06-05 DIAGNOSIS — I350 Nonrheumatic aortic (valve) stenosis: Secondary | ICD-10-CM | POA: Diagnosis not present

## 2018-06-05 DIAGNOSIS — Z79899 Other long term (current) drug therapy: Secondary | ICD-10-CM

## 2018-06-05 DIAGNOSIS — C50412 Malignant neoplasm of upper-outer quadrant of left female breast: Secondary | ICD-10-CM | POA: Diagnosis present

## 2018-06-05 MED ORDER — HEPARIN SOD (PORK) LOCK FLUSH 100 UNIT/ML IV SOLN
500.0000 [IU] | Freq: Once | INTRAVENOUS | Status: AC
  Administered 2018-06-05: 500 [IU]
  Filled 2018-06-05: qty 5

## 2018-06-05 MED ORDER — ROSUVASTATIN CALCIUM 20 MG PO TABS: 20.0000 mg | ORAL_TABLET | Freq: Every day | ORAL | 3 refills | Status: DC

## 2018-06-05 MED ORDER — SODIUM CHLORIDE 0.9% FLUSH
10.0000 mL | Freq: Once | INTRAVENOUS | Status: AC
  Administered 2018-06-05: 10 mL
  Filled 2018-06-05: qty 10

## 2018-06-05 NOTE — Patient Instructions (Signed)
Medication Instructions:  Please start Crestor 20 mg once a day. Continue all other medications as listed.  If you need a refill on your cardiac medications before your next appointment, please call your pharmacy.   Lab work: Please have fasting blood work in 2 months to check your cholesterol (Lipid, ALT)  This can be drawn through your port at the [facility]. If you have labs (blood work) drawn today and your tests are completely normal, you will receive your results only by: [name] MyChart Message (if you have MyChart) OR . A paper copy in the mail If you have any lab test that is abnormal or we need to change your treatment, we will call you to review the results.  Testing/Procedures: Your physician has requested that you have an echocardiogram. Echocardiography is a painless test that uses sound waves to create images of your heart. It provides your doctor with information about the size and shape of your heart and how well your heart's chambers and valves are working. This procedure takes approximately one hour. There are no restrictions for this procedure.  Follow-Up: At [facility], you and your health needs are our priority.  As part of our continuing mission to provide you with exceptional heart care, we have created designated Provider Care Teams.  These Care Teams include your primary Cardiologist (physician) and Advanced Practice Providers (APPs -  Physician Assistants and Nurse Practitioners) who all work together to provide you with the care you need, when you need it. You will need a follow up appointment in 12 months.  Please call our office 2 months in advance to schedule this appointment.  You may see Dr Marlou Porch. or one of the following Advanced Practice Providers on your designated Care Team:   Truitt Merle, NP Cecilie Kicks, NP . Kathyrn Drown, NP  Thank you for choosing [facility]!!

## 2018-06-05 NOTE — Progress Notes (Signed)
Cardiology Office Note:    Date:  06/05/2018   ID:  Kathy Maxwell, DOB 10/29/1955, MRN [id number]  PCP:  Kathy Chard, MD  Cardiologist:  No primary care provider on file.  Electrophysiologist:  None   Referring MD: Kathy Chard, MD     History of Present Illness:    Kathy Maxwell is a 62 y.o. female here for the evaluation of murmur and coronary artery disease at the request of Dr. Baird Cancer.  She has diabetes with hypertension and hyperlipidemia.  She is also had a PCI placed for coronary artery disease but has not seen a cardiologist in several years.  Heart murmur is also more noticeable to Dr. Baird Cancer.  Sister MI at 39.  Mother CHF 67 Non-smoker  Overall she has been feeling fairly well.  She has been treated for breast cancer.  Still has a port in place because of difficult lab draws.  She denies any fevers chills nausea vomiting syncope bleeding chest pain shortness of breath.  Today she does not have any significant palpitations and her complaints.     Past Medical History:  Diagnosis Date  . Allergy    latex  . Arthritis   . Bipolar 1 disorder (Kathy Maxwell)   . Bipolar 1 disorder ([place]) 04/04/2013  . Blood transfusion without reported diagnosis    2014 during chemotherapy  . Breast cancer (Kathy Maxwell) 03/10/13   left breast invasive ca  . Cancer (Kathy Maxwell)   . Coronary artery disease   . Diabetes mellitus without complication (HCC)    NIDDM  . GERD (gastroesophageal reflux disease)   . Heart murmur   . Hyperlipidemia   . Hyperlipidemia 04/04/2013  . Hypertension   . Hypertension 04/04/2013  . Obesity   . Personal history of chemotherapy   . Personal history of radiation therapy   . Radiation 01/30/14-03/19/14  . Sleep apnea    no cpap due to insurance    Past Surgical History:  Procedure Laterality Date  . ABDOMINAL HYSTERECTOMY    . BREAST BIOPSY Left 05/08/09   fibroadenoma with microcalcifications,no malignancy id  . BREAST BIOPSY Left 03/07/13   invasive ca,1 lymph  node metastatic  . 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 BREAST LUMPECTOMY Right 2014  . BREAST LUMPECTOMY WITH NEEDLE LOCALIZATION AND AXILLARY SENTINEL LYMPH NODE BX Right 04/22/2013   Procedure: BREAST LUMPECTOMY WITH NEEDLE LOCALIZATION AND AXILLARY SENTINEL LYMPH NODE BX;  Surgeon: Kathy Hector, MD;  Location: [place];  Service: General;  Laterality: Right;  . CARDIAC CATHETERIZATION    . CHOLECYSTECTOMY    . CORONARY ANGIOPLASTY     LAD stent 2003  . CORONARY STENT PLACEMENT  2001  . ESOPHAGOGASTRODUODENOSCOPY N/A 08/07/2013   Procedure: ESOPHAGOGASTRODUODENOSCOPY (EGD);  Surgeon: Kathy Sabins, MD;  Location: [facility];  Service: Endoscopy;  Laterality: N/A;  . ESOPHAGOGASTRODUODENOSCOPY N/A 11/13/2013   Procedure: ESOPHAGOGASTRODUODENOSCOPY (EGD);  Surgeon: Kathy Horner, MD;  Location: [facility] ENDOSCOPY;  Service: Endoscopy;  Laterality: N/A;  . EYE SURGERY  6/13,9/14   laser,cataract lft  . MASTECTOMY Left 2014  . MASTECTOMY MODIFIED RADICAL Left 04/22/2013   Procedure: MASTECTOMY MODIFIED RADICAL;  Surgeon: Kathy Hector, MD;  Location: [place];  Service: General;  Laterality: Left;  . PORTACATH PLACEMENT Right 04/22/2013   Procedure: INSERTION PORT-A-CATH;  Surgeon: Kathy Hector, MD;  Location: [place];  Service: General;  Laterality: Right;    Current Medications: Current Meds  Medication Sig  .  amLODipine (NORVASC) 10 MG tablet Take 10 mg by mouth daily.  [name] anastrozole (ARIMIDEX) 1 MG tablet TAKE 1 TABLET (1 MG TOTAL) BY MOUTH DAILY.  [name] b complex vitamins tablet Take 1 tablet by mouth daily.  [name] buPROPion (WELLBUTRIN SR) 150 MG 12 hr tablet Take 1 tablet (150 mg total) by mouth daily.  . cholecalciferol (VITAMIN D) 1000 units tablet Take 1 tablet (1,000 Units total) by mouth daily.  [name] gabapentin (NEURONTIN) 100 MG capsule Take 3 capsules (300 mg total) by mouth 3 (three) times daily.  . hydrALAZINE (APRESOLINE) 25 MG  tablet Take 25 mg by mouth 3 (three) times daily.   [name] lidocaine-prilocaine (EMLA) cream Apply topically as needed. Apply to port-a-cath one - two hours before accessing.  . metoprolol succinate (TOPROL-XL) 25 MG 24 hr tablet Take 25 mg by mouth daily.   . naproxen (NAPROSYN) 500 MG tablet Take 1 tablet (500 mg total) by mouth 2 (two) times daily with a meal.  . pantoprazole (PROTONIX) 40 MG tablet Take 1 tablet (40 mg total) by mouth daily.  . pioglitazone-metformin (ACTOPLUS MET) 15-850 MG per tablet Take 1 tablet by mouth every morning.   . sitaGLIPtin (JANUVIA) 100 MG tablet Take 100 mg by mouth daily.     Allergies:   Cephalexin; Adhesive [tape]; and Latex   Social History   Socioeconomic History  . Marital status: Single    Spouse name: Not on file  . Number of children: 0  . Years of education: Not on file  . Highest education level: Not on file  Occupational History  . Occupation: DISABLED    Employer: UNEMPLOYED  Social Needs  . Financial resource strain: Not on file  . Food insecurity:    Worry: Not on file    Inability: Not on file  . Transportation needs:    Medical: Not on file    Non-medical: Not on file  Tobacco Use  . Smoking status: Former Smoker    Packs/day: 1.00    Years: 15.00    Pack years: 15.00    Types: Cigarettes    Last attempt to quit: 07/19/1987    Years since quitting: 30.9  . Smokeless tobacco: Never Used  Substance and Sexual Activity  . Alcohol use: No    Alcohol/week: 0.0 standard drinks  . Drug use: No  . Sexual activity: Never    Birth control/protection: Surgical  Lifestyle  . Physical activity:    Days per week: Not on file    Minutes per session: Not on file  . Stress: Not on file  Relationships  . Social connections:    Talks on phone: Not on file    Gets together: Not on file    Attends religious service: Not on file    Active member of club or organization: Not on file    Attends meetings of clubs or organizations: Not on  file    Relationship status: Not on file  Other Topics Concern  . Not on file  Social History Narrative  . Not on file     Family History: The patient's family history includes Brain cancer (age of onset: 69) in her cousin; Breast cancer in her cousin and maternal aunt; Cancer in her maternal aunt and paternal aunt; Heart attack (age of onset: 58) in her sister; Heart disease in her mother; Heart disease (age of onset: 70) in her cousin; Lung cancer (age of onset: 57) in her cousin; Lung cancer (age of onset: 42)  in her cousin. There is no history of Colon cancer.  ROS:   Please see the history of present illness.    Denies any fevers chills nausea vomiting syncope bleeding all other systems reviewed and are negative.  EKGs/Labs/Other Studies Reviewed:    The following studies were reviewed today: Our office notes, lab work, EKG  Nuclear stress test 2015-low risk, no ischemia.  Echocardiogram 04/18/2013:  - Left ventricle: Global longitudinal strain is normal 18%. The cavity size was normal. Wall thickness was increased in a pattern of moderate LVH. Systolic function was normal. The estimated ejection fraction was in the range of 55% to 60%. Wall motion was normal; there were no regional wall motion abnormalities. Doppler parameters are consistent with abnormal left ventricular relaxation (grade 1 diastolic dysfunction). - Aortic valve: There was mild stenosis. - Mitral valve: Calcified annulus. - Atrial septum: No defect or patent foramen ovale was identified.  EKG:  EKG is  ordered today.  The ekg ordered today demonstrates 06/05/2018-sinus rhythm 99 with possible left atrial enlargement, LVH noted.  Recent Labs: 03/01/2018: ALT 15; BUN 23; Creatinine, Ser 1.15; Hemoglobin 12.0; Platelets 313; Potassium 4.4; Sodium 135  Recent Lipid Panel    Component Value Date/Time   CHOL 209 (H) 03/01/2018 1141   TRIG 122 03/01/2018 1141   HDL 52 03/01/2018 1141    CHOLHDL 4.0 03/01/2018 1141   VLDL 24 03/01/2018 1141   LDLCALC 133 (H) 03/01/2018 1141    Physical Exam:    VS:  BP (!) 148/70   Pulse 99   Ht 5' 1.5" (1.562 m)   Wt 202 lb 3.2 oz (91.7 kg)   BMI 37.59 kg/m     Wt Readings from Last 3 Encounters:  06/05/18 202 lb 3.2 oz (91.7 kg)  03/01/18 208 lb 9.6 oz (94.6 kg)  09/13/17 209 lb 8 oz (95 kg)     GEN:  Well nourished, well developed in no acute distress HEENT: Normal, without hair NECK: No JVD; No carotid bruits LYMPHATICS: No lymphadenopathy, port in place. CARDIAC: RRR, 2/6 systolic murmur RUSB, rubs, gallops RESPIRATORY:  Clear to auscultation without rales, wheezing or rhonchi  ABDOMEN: Soft, non-tender, non-distended MUSCULOSKELETAL:  No edema; No deformity  SKIN: Warm and dry NEUROLOGIC:  Alert and oriented x 3 PSYCHIATRIC:  Normal affect   ASSESSMENT:    1. Cardiac murmur   2. Hyperlipidemia, unspecified hyperlipidemia type   3. Essential hypertension   4. Aortic valve stenosis, etiology of cardiac valve disease unspecified   5. Long-term use of high-risk medication    PLAN:    In order of problems listed above:  Heart murmur/aortic stenosis -We will check an echocardiogram for further evaluation. -Likely represents aortic stenosis, previously mild in 2014.  There may be advancement.  Coronary artery disease - Prior stent placement in the past.  It is been several years since last evaluation.  She is not having any anginal symptoms.  Continue with aggressive secondary prevention.  Currently on beta-blocker, diabetes care.  I do not currently see a statin on her medication list.  Certainly would recommend this for further secondary prevention especially given her CAD and diabetes.  Prior nuclear stress test low risk with no ischemia in 2015.  Hyperlipidemia -LDL 133.  Goal less than 70. - We will restart her Crestor at 20 mg once a day.  In 2 months please check a lipid panel and ALT.  These labs can be  drawn via her port at the cancer center.  Morbid obesity -BMI greater than 35 with 2 or more comorbidities.  Continue to encourage weight loss.  Breast cancer - Per oncology.  Diabetes with hypertension - Medications reviewed.  Continue  Medication Adjustments/Labs and Tests Ordered: Current medicines are reviewed at length with the patient today.  Concerns regarding medicines are outlined above.  Orders Placed This Encounter  Procedures  . ALT  . Lipid panel  . EKG 12-Lead  . ECHOCARDIOGRAM COMPLETE   Meds ordered this encounter  Medications  . rosuvastatin (CRESTOR) 20 MG tablet    Sig: Take 1 tablet (20 mg total) by mouth daily.    Dispense:  90 tablet    Refill:  3    Patient Instructions  Medication Instructions:  Please start Crestor 20 mg once a day. Continue all other medications as listed.  If you need a refill on your cardiac medications before your next appointment, please call your pharmacy.   Lab work: Please have fasting blood work in 2 months to check your cholesterol (Lipid, ALT)  This can be drawn through your port at the [facility]. If you have labs (blood work) drawn today and your tests are completely normal, you will receive your results only by: [name] MyChart Message (if you have MyChart) OR . A paper copy in the mail If you have any lab test that is abnormal or we need to change your treatment, we will call you to review the results.  Testing/Procedures: Your physician has requested that you have an echocardiogram. Echocardiography is a painless test that uses sound waves to create images of your heart. It provides your doctor with information about the size and shape of your heart and how well your heart's chambers and valves are working. This procedure takes approximately one hour. There are no restrictions for this procedure.  Follow-Up: At [facility], you and your health needs are our priority.  As part of our continuing mission to provide  you with exceptional heart care, we have created designated Provider Care Teams.  These Care Teams include your primary Cardiologist (physician) and Advanced Practice Providers (APPs -  Physician Assistants and Nurse Practitioners) who all work together to provide you with the care you need, when you need it. You will need a follow up appointment in 12 months.  Please call our office 2 months in advance to schedule this appointment.  You may see Dr Marlou Porch. or one of the following Advanced Practice Providers on your designated Care Team:   Truitt Merle, NP Cecilie Kicks, NP . Kathyrn Drown, NP  Thank you for choosing [facility]!!         Signed, Candee Furbish, MD  06/05/2018 11:04 AM    [place]

## 2018-06-06 ENCOUNTER — Encounter: Payer: Self-pay | Admitting: Internal Medicine

## 2018-06-06 ENCOUNTER — Ambulatory Visit (INDEPENDENT_AMBULATORY_CARE_PROVIDER_SITE_OTHER): Payer: Medicare HMO | Admitting: Internal Medicine

## 2018-06-06 ENCOUNTER — Ambulatory Visit: Payer: PPO

## 2018-06-06 VITALS — BP 140/70 | HR 68 | Temp 98.2°F | Ht 61.5 in | Wt 201.6 lb

## 2018-06-06 DIAGNOSIS — Z1211 Encounter for screening for malignant neoplasm of colon: Secondary | ICD-10-CM

## 2018-06-06 DIAGNOSIS — N3941 Urge incontinence: Secondary | ICD-10-CM | POA: Diagnosis not present

## 2018-06-06 DIAGNOSIS — Z1159 Encounter for screening for other viral diseases: Secondary | ICD-10-CM | POA: Diagnosis not present

## 2018-06-06 DIAGNOSIS — N3000 Acute cystitis without hematuria: Secondary | ICD-10-CM | POA: Diagnosis not present

## 2018-06-06 DIAGNOSIS — R42 Dizziness and giddiness: Secondary | ICD-10-CM

## 2018-06-06 DIAGNOSIS — R69 Illness, unspecified: Secondary | ICD-10-CM | POA: Diagnosis not present

## 2018-06-06 DIAGNOSIS — E0841 Diabetes mellitus due to underlying condition with diabetic mononeuropathy: Secondary | ICD-10-CM | POA: Diagnosis not present

## 2018-06-06 DIAGNOSIS — Z23 Encounter for immunization: Secondary | ICD-10-CM | POA: Diagnosis not present

## 2018-06-06 DIAGNOSIS — I10 Essential (primary) hypertension: Secondary | ICD-10-CM

## 2018-06-06 DIAGNOSIS — Z114 Encounter for screening for human immunodeficiency virus [HIV]: Secondary | ICD-10-CM

## 2018-06-06 DIAGNOSIS — Z0001 Encounter for general adult medical examination with abnormal findings: Secondary | ICD-10-CM | POA: Diagnosis not present

## 2018-06-06 DIAGNOSIS — N760 Acute vaginitis: Secondary | ICD-10-CM

## 2018-06-06 LAB — POCT URINALYSIS DIPSTICK
Bilirubin, UA: NEGATIVE
Glucose, UA: POSITIVE — AB
Ketones, UA: NEGATIVE
NITRITE UA: POSITIVE
PH UA: 5 (ref 5.0–8.0)
PROTEIN UA: POSITIVE — AB
Spec Grav, UA: 1.025 (ref 1.010–1.025)
Urobilinogen, UA: 0.2 E.U./dL

## 2018-06-06 LAB — POCT UA - MICROALBUMIN
Creatinine, POC: 300 mg/dL
MICROALBUMIN (UR) POC: 150 mg/L

## 2018-06-06 MED ORDER — TETANUS-DIPHTH-ACELL PERTUSSIS 5-2-15.5 LF-MCG/0.5 IM SUSP: 0.5000 mL | Freq: Once | INTRAMUSCULAR | 0 refills | Status: AC

## 2018-06-06 MED ORDER — CIPROFLOXACIN HCL 250 MG PO TABS: 250.0000 mg | ORAL_TABLET | Freq: Two times a day (BID) | ORAL | 0 refills | Status: DC

## 2018-06-06 NOTE — Progress Notes (Addendum)
hemoglo Subjective:     Patient ID: Kathy Maxwell , female    DOB: 04/05/1956 , 62 y.o.   MRN: [id number]   Chief Complaint  Patient presents with  . Medicare Wellness    HPI Pt is here for Medicare wellness visit and annual physical.  Eye xm due now and plans to make apt.  Dexa- 2018 Colonoscopy- 2018, normal Has been having dysuria x 3 days. No fever, chills or sweats. Also has been having vaginal itching x 1 week. Her glucose have not been in good control.  Saw her cardiologist yesterday and had normal EKG, she is stable. He did order an echo.  Past Medical History:  Diagnosis Date  . Allergy    latex  . Arthritis   . Bipolar 1 disorder ([place])   . Bipolar 1 disorder ([place]) 04/04/2013  . Blood transfusion without reported diagnosis    2014 during chemotherapy  . Breast cancer ([place]) 03/10/13   left breast invasive ca  . Cancer ([place])   . Coronary artery disease   . Diabetes mellitus without complication (HCC)    NIDDM  . GERD (gastroesophageal reflux disease)   . Heart murmur   . Hyperlipidemia   . Hyperlipidemia 04/04/2013  . Hypertension   . Hypertension 04/04/2013  . Obesity   . Personal history of chemotherapy   . Personal history of radiation therapy   . Radiation 01/30/14-03/19/14  . Sleep apnea    no cpap due to insurance     Family History  Problem Relation Age of Onset  . Heart disease Mother   . Breast cancer Maternal Aunt        dx in her 8s  . Heart disease Cousin 38       maternal cousin  . Heart attack Sister 85  . Cancer Paternal Aunt        NOS  . Cancer Maternal Aunt        NOS  . Lung cancer Cousin 83       maternal cousin - non smoker  . Brain cancer Cousin 13       maternal cousin's son  . Lung cancer Cousin 17       maternal cousin's son  . Breast cancer Cousin        paternal cousin  . Colon cancer Neg Hx      Current Outpatient Medications:  .  amLODipine (NORVASC) 10 MG tablet, Take 10 mg by mouth daily., Disp: , Rfl:  .   anastrozole (ARIMIDEX) 1 MG tablet, TAKE 1 TABLET (1 MG TOTAL) BY MOUTH DAILY., Disp: 90 tablet, Rfl: 4 .  b complex vitamins tablet, Take 1 tablet by mouth daily., Disp: , Rfl:  .  buPROPion (WELLBUTRIN SR) 150 MG 12 hr tablet, Take 1 tablet (150 mg total) by mouth daily., Disp: 30 tablet, Rfl: 6 .  cholecalciferol (VITAMIN D) 1000 units tablet, Take 1 tablet (1,000 Units total) by mouth daily., Disp: 100 tablet, Rfl: 4 .  gabapentin (NEURONTIN) 100 MG capsule, Take 3 capsules (300 mg total) by mouth 3 (three) times daily., Disp: 270 capsule, Rfl: 6 .  hydrALAZINE (APRESOLINE) 25 MG tablet, Take 25 mg by mouth 3 (three) times daily. , Disp: , Rfl:  .  lidocaine-prilocaine (EMLA) cream, Apply topically as needed. Apply to port-a-cath one - two hours before accessing., Disp: 30 g, Rfl: 3 .  metoprolol succinate (TOPROL-XL) 25 MG 24 hr tablet, Take 25 mg by mouth daily. , Disp: ,  Rfl:  .  naproxen (NAPROSYN) 500 MG tablet, Take 1 tablet (500 mg total) by mouth 2 (two) times daily with a meal., Disp: 60 tablet, Rfl: 0 .  pantoprazole (PROTONIX) 40 MG tablet, Take 1 tablet (40 mg total) by mouth daily., Disp: 30 tablet, Rfl: 3 .  pioglitazone-metformin (ACTOPLUS MET) 15-850 MG per tablet, Take 1 tablet by mouth every morning. , Disp: , Rfl:  .  rosuvastatin (CRESTOR) 20 MG tablet, Take 1 tablet (20 mg total) by mouth daily., Disp: 90 tablet, Rfl: 3 .  sitaGLIPtin (JANUVIA) 100 MG tablet, Take 100 mg by mouth daily., Disp: , Rfl:  No current facility-administered medications for this visit.   Facility-Administered Medications Ordered in Other Visits:  .  sodium chloride 0.9 % injection 10 mL, 10 mL, Intravenous, PRN, Marcy Panning, MD, 10 mL at 05/24/13 1522   Allergies  Allergen Reactions  . Cephalexin Other (See Comments)    Burned throat.  . Adhesive [Tape] Rash    Clear tape gloves ,elastic no problem  . Latex Hives and Rash     Review of Systems  Constitutional: Negative.   HENT:  Negative.   Eyes: Negative.   Respiratory: Negative for cough, shortness of breath and wheezing.   Cardiovascular: Negative for chest pain, palpitations and leg swelling.  Gastrointestinal: Negative.   Endocrine: Positive for cold intolerance, polyphagia and polyuria. Negative for heat intolerance and polydipsia.  Genitourinary: Positive for dysuria, frequency and urgency. Negative for difficulty urinating, vaginal bleeding, vaginal discharge and vaginal pain.  Musculoskeletal: Positive for arthralgias. Negative for back pain, gait problem, joint swelling, myalgias and neck stiffness.       R hip  Skin: Negative.   Allergic/Immunologic: Negative for environmental allergies and food allergies.  Neurological: Positive for dizziness and numbness. Negative for tremors, syncope, weakness and headaches.       Has chronic vertigo when stressed , x 3 last month. None this month. Relaxation helps.  Numbness on feet only  Hematological: Negative for adenopathy. Does not bruise/bleed easily.  Psychiatric/Behavioral: Positive for agitation and sleep disturbance. Negative for confusion, decreased concentration, hallucinations and suicidal ideas. The patient is not nervous/anxious.      Today's Vitals   06/06/18 1010 06/06/18 1016  BP: 140/70   Pulse: 68   Temp: 98.2 F (36.8 C)   TempSrc: Oral   SpO2: 95%   Weight: 201 lb 9.6 oz (91.4 kg)   Height: 5' 1.5" (1.562 m)   PainSc:  9    Body mass index is 37.48 kg/m.   Objective:  Physical Exam  BP 140/70 (BP Location: Left Arm, Patient Position: Sitting, Cuff Size: Normal)   Pulse 68   Temp 98.2 F (36.8 C) (Oral)   Ht 5' 1.5" (1.562 m)   Wt 201 lb 9.6 oz (91.4 kg)   SpO2 95%   BMI 37.48 kg/m   General Appearance:    Alert, cooperative, no distress, appears stated age  Head:    Normocephalic, without obvious abnormality, atraumatic  Eyes:    PERRL, conjunctiva/corneas clear, EOM's intact, fundi    benign, both eyes  Ears:    Normal  TM's and external ear canals, both ears  Nose:   Nares normal, septum midline, mucosa normal, no drainage    or sinus tenderness  Throat:   Lips, mucosa, and tongue normal; teeth and gums normal  Neck:   Supple, symmetrical, trachea midline, no adenopathy;    thyroid:  no enlargement/tenderness/nodules; difficult to assess for carotid  bruit since her heart murmur radiates to her neck.   Back:     Symmetric, no curvature, ROM normal, no CVA tenderness  Lungs:     Clear to auscultation bilaterally, respirations unlabored  Chest Wall:    No tenderness or deformity   Heart:    Regular rate and rhythm, S1 and S2 normal, with 3/6 murmur.  Breast Exam:    No tenderness, masses, or nipple abnormality on R. L breast is absence No palpable L chest wall masses  Abdomen:     Soft, non-tender, bowel sounds active all four quadrants,    no masses, no organomegaly  Genitalia:    External genitalia with white chunky discharge, Sample obtained to send out for vaginitis swab  Rectal:    Normal tone, no masses or tenderness;   guaiac negative stool  Extremities:   Extremities normal, atraumatic, no cyanosis or edema for exception of moderate tenderness of R hip with internal rotation. Leg length is equal.   Pulses:   2+ and symmetric all extremities  Skin:   Skin color, texture, turgor normal, no rashes or lesions  Lymph nodes:   Cervical, supraclavicular, and axillary nodes normal  Neurologic:   CNII-XII intact, normal strength, and reflexes    Throughout. Has decreased sensation. See DM foot exam.                                              She developed vertigo when she laid down  On her back, and any movement would provoke it, so she could only take a few steps to do the [place],but could not finish.   Assessment And Plan:   1. Essential hypertension- borderline systolic. May continue same meds.  - POCT Urinalysis Dipstick (81002) - POCT UA - Microalbumin - CBC no Diff - CMP14 + Anion Gap  2.  Diabetic mononeuropathy associated with diabetes mellitus due to underlying condition [facility])- chronic  - POCT Urinalysis Dipstick (81002) - POCT UA - Microalbumin - Hemoglobin A1c  3. Acute vaginitis- acute. Was placed on Diflucan. We will call her when results come.  - NuSwab Vaginitis (VG)  4. Immunization due- preventive - Pneumococcal polysaccharide vaccine 23-valent greater than or equal to 2yo subcutaneous/IM - Tdap (ADACEL) 11-16-13.5 LF-MCG/0.5 injection; Inject 0.5 mLs into the muscle once for 1 dose.  Dispense: 0.5 mL; Refill: 0 I sent TDAp to her pharmacy.  5. Need for influenza vaccination - Flu Vaccine QUAD 36+ mos IM  6. Encounter for hepatitis C screening test for low risk patient- screen - Hepatitis C antibody  7. Screening for HIV without presence of risk factors- screen - HIV antibody (with reflex)  8. Acute cystitis without hematuria-acute. Was placed on - ciprofloxacin (CIPRO) 250 MG tablet; Take 1 tablet (250 mg total) by mouth 2 (two) times daily.  Dispense: 10 tablet; Refill: 0 - Culture, Urine was ordered  9. Urge incontinence- chronic. She declined pelvic PT.  10. Encounter for general adult medical examination with abnormal findings- routine. FU 1 y.  11- Vertigo- chronic. No action. Pt is unable to take Meclizine and ENT told her to just be carefull. She is not driving, has a relative picking her up.  She will continue current meds. We may have to do some changes with DM meds if A1C is abnormal.   I reviewed her EKG from yesterday and shows  L ventricular hypertrophy.   Damin Salido RODRIGUEZ[place]SOUTH[place]TH, PA-C

## 2018-06-06 NOTE — Patient Instructions (Addendum)
Preventive Care 40-64 Years, Female Preventive care refers to lifestyle choices and visits with your health care provider that can promote health and wellness. What does preventive care include?  A yearly physical exam. This is also called an annual well check.  Dental exams once or twice a year.  Routine eye exams. Ask your health care provider how often you should have your eyes checked.  Personal lifestyle choices, including: ? Daily care of your teeth and gums. ? Regular physical activity. ? Eating a healthy diet. ? Avoiding tobacco and drug use. ? Limiting alcohol use. ? Practicing safe sex. ? Taking low-dose aspirin daily starting at age 58. ? Taking vitamin and mineral supplements as recommended by your health care provider. What happens during an annual well check? The services and screenings done by your health care provider during your annual well check will depend on your age, overall health, lifestyle risk factors, and family history of disease. Counseling Your health care provider may ask you questions about your:  Alcohol use.  Tobacco use.  Drug use.  Emotional well-being.  Home and relationship well-being.  Sexual activity.  Eating habits.  Work and work Statistician.  Method of birth control.  Menstrual cycle.  Pregnancy history.  Screening You may have the following tests or measurements:  Height, weight, and BMI.  Blood pressure.  Lipid and cholesterol levels. These may be checked every 5 years, or more frequently if you are over 81 years old.  Skin check.  Lung cancer screening. You may have this screening every year starting at age 78 if you have a 30-pack-year history of smoking and currently smoke or have quit within the past 15 years.  Fecal occult blood test (FOBT) of the stool. You may have this test every year starting at age 65.  Flexible sigmoidoscopy or colonoscopy. You may have a sigmoidoscopy every 5 years or a colonoscopy  every 10 years starting at age 30.  Hepatitis C blood test.  Hepatitis B blood test.  Sexually transmitted disease (STD) testing.  Diabetes screening. This is done by checking your blood sugar (glucose) after you have not eaten for a while (fasting). You may have this done every 1-3 years.  Mammogram. This may be done every 1-2 years. Talk to your health care provider about when you should start having regular mammograms. This may depend on whether you have a family history of breast cancer.  BRCA-related cancer screening. This may be done if you have a family history of breast, ovarian, tubal, or peritoneal cancers.  Pelvic exam and Pap test. This may be done every 3 years starting at age 80. Starting at age 36, this may be done every 5 years if you have a Pap test in combination with an HPV test.  Bone density scan. This is done to screen for osteoporosis. You may have this scan if you are at high risk for osteoporosis.  Discuss your test results, treatment options, and if necessary, the need for more tests with your health care provider. Vaccines Your health care provider may recommend certain vaccines, such as:  Influenza vaccine. This is recommended every year.  Tetanus, diphtheria, and acellular pertussis (Tdap, Td) vaccine. You may need a Td booster every 10 years.  Varicella vaccine. You may need this if you have not been vaccinated.  Zoster vaccine. You may need this after age 5.  Measles, mumps, and rubella (MMR) vaccine. You may need at least one dose of MMR if you were born in  1957 or later. You may also need a second dose.  Pneumococcal 13-valent conjugate (PCV13) vaccine. You may need this if you have certain conditions and were not previously vaccinated.  Pneumococcal polysaccharide (PPSV23) vaccine. You may need one or two doses if you smoke cigarettes or if you have certain conditions.  Meningococcal vaccine. You may need this if you have certain  conditions.  Hepatitis A vaccine. You may need this if you have certain conditions or if you travel or work in places where you may be exposed to hepatitis A.  Hepatitis B vaccine. You may need this if you have certain conditions or if you travel or work in places where you may be exposed to hepatitis B.  Haemophilus influenzae type b (Hib) vaccine. You may need this if you have certain conditions.  Talk to your health care provider about which screenings and vaccines you need and how often you need them. This information is not intended to replace advice given to you by your health care provider. Make sure you discuss any questions you have with your health care provider. Document Released: 07/31/2015 Document Revised: 03/23/2016 Document Reviewed: 05/05/2015 Elsevier Interactive Patient Education  2018 [name]  Kathy Maxwell , Thank you for taking time to come for your Medicare Wellness Visit. I appreciate your ongoing commitment to your health goals. Please review the following plan we discussed and let me know if I can assist you in the future.   These are the goals we discussed: Goals    . Weight (lb) < 200 lb (90.7 kg)     Loose 50 lb       This is a list of the screening recommended for you and due dates:  Health Maintenance  Topic Date Due  .  Hepatitis C: One time screening is recommended by Center for Disease Control  (CDC) for  adults born from [birth year] through 1967.   1955-11-11  . Complete foot exam   02/21/1964  . Eye exam for diabetics  02/21/1964  . HIV Screening  02/20/1969  . Tetanus Vaccine  02/20/1973  . Pap Smear  02/21/1975  . Mammogram  06/02/2016  . Flu Shot  02/15/2018  . Hemoglobin A1C  09/01/2018  . Colon Cancer Screening  02/08/2026

## 2018-06-07 ENCOUNTER — Other Ambulatory Visit: Payer: Self-pay | Admitting: Internal Medicine

## 2018-06-07 ENCOUNTER — Other Ambulatory Visit: Payer: Self-pay

## 2018-06-07 DIAGNOSIS — N760 Acute vaginitis: Secondary | ICD-10-CM | POA: Diagnosis not present

## 2018-06-07 MED ORDER — ROSUVASTATIN CALCIUM 20 MG PO TABS: 20.0000 mg | ORAL_TABLET | Freq: Every day | ORAL | 12 refills | Status: DC

## 2018-06-08 ENCOUNTER — Ambulatory Visit: Payer: Medicare HMO | Admitting: Cardiology

## 2018-06-09 LAB — URINE CULTURE

## 2018-06-09 LAB — NUSWAB VAGINITIS (VG)
CANDIDA GLABRATA, NAA: NEGATIVE
Candida albicans, NAA: NEGATIVE
TRICH VAG BY NAA: NEGATIVE

## 2018-06-18 ENCOUNTER — Other Ambulatory Visit: Payer: Self-pay

## 2018-06-18 ENCOUNTER — Other Ambulatory Visit: Payer: Self-pay | Admitting: Nurse Practitioner

## 2018-06-18 DIAGNOSIS — M25551 Pain in right hip: Secondary | ICD-10-CM

## 2018-06-18 MED ORDER — NITROFURANTOIN MONOHYD MACRO 100 MG PO CAPS: 100.0000 mg | ORAL_CAPSULE | Freq: Two times a day (BID) | ORAL | 0 refills | Status: AC

## 2018-06-27 ENCOUNTER — Other Ambulatory Visit: Payer: Self-pay | Admitting: Nurse Practitioner

## 2018-06-27 DIAGNOSIS — M25551 Pain in right hip: Secondary | ICD-10-CM

## 2018-06-27 MED ORDER — NAPROXEN 500 MG PO TABS: 500.0000 mg | ORAL_TABLET | Freq: Two times a day (BID) | ORAL | 0 refills | Status: DC

## 2018-06-28 ENCOUNTER — Other Ambulatory Visit (HOSPITAL_COMMUNITY): Payer: Medicare HMO

## 2018-07-03 ENCOUNTER — Telehealth: Payer: Self-pay

## 2018-07-03 NOTE — Telephone Encounter (Signed)
Per 12/17 return call to schedule a lab with patient.

## 2018-07-06 ENCOUNTER — Other Ambulatory Visit (HOSPITAL_COMMUNITY): Payer: Medicare HMO

## 2018-07-12 ENCOUNTER — Telehealth: Payer: Self-pay | Admitting: Oncology

## 2018-07-12 NOTE — Telephone Encounter (Signed)
Called patient in regards to 12/9 voicemail log about needing a lab appointment. Patient voicemail box is full and was able to leave a message.

## 2018-07-13 MED FILL — ROSUVASTATIN CALCIUM 20 MG: 20 | 30 days supply | Qty: 30 | Fill #0

## 2018-07-13 MED FILL — ANASTROZOLE 1 MG TABLET: 1 | 90 days supply | Qty: 90 | Fill #2

## 2018-07-24 ENCOUNTER — Other Ambulatory Visit: Payer: Self-pay | Admitting: Nurse Practitioner

## 2018-07-24 DIAGNOSIS — M25551 Pain in right hip: Secondary | ICD-10-CM

## 2018-07-25 MED FILL — ROSUVASTATIN CALCIUM 20 MG: 20 | 30 days supply | Qty: 30 | Fill #0

## 2018-07-25 MED FILL — ANASTROZOLE 1 MG TABLET: 1 | 90 days supply | Qty: 90 | Fill #2

## 2018-07-30 ENCOUNTER — Other Ambulatory Visit: Payer: Self-pay | Admitting: Nurse Practitioner

## 2018-08-06 ENCOUNTER — Other Ambulatory Visit (HOSPITAL_COMMUNITY): Payer: Medicare HMO

## 2018-08-11 ENCOUNTER — Other Ambulatory Visit: Payer: Self-pay | Admitting: Nurse Practitioner

## 2018-08-11 DIAGNOSIS — I160 Hypertensive urgency: Secondary | ICD-10-CM | POA: Diagnosis not present

## 2018-08-11 DIAGNOSIS — E669 Obesity, unspecified: Secondary | ICD-10-CM | POA: Diagnosis not present

## 2018-08-11 DIAGNOSIS — F319 Bipolar disorder, unspecified: Secondary | ICD-10-CM | POA: Diagnosis not present

## 2018-08-11 DIAGNOSIS — I10 Essential (primary) hypertension: Secondary | ICD-10-CM | POA: Diagnosis not present

## 2018-08-11 DIAGNOSIS — R079 Chest pain, unspecified: Secondary | ICD-10-CM | POA: Diagnosis not present

## 2018-08-11 DIAGNOSIS — R0789 Other chest pain: Secondary | ICD-10-CM | POA: Diagnosis not present

## 2018-08-11 DIAGNOSIS — Z853 Personal history of malignant neoplasm of breast: Secondary | ICD-10-CM | POA: Diagnosis not present

## 2018-08-11 DIAGNOSIS — I517 Cardiomegaly: Secondary | ICD-10-CM | POA: Diagnosis not present

## 2018-08-11 DIAGNOSIS — I700 Atherosclerosis of aorta: Secondary | ICD-10-CM | POA: Diagnosis not present

## 2018-08-11 DIAGNOSIS — Z79811 Long term (current) use of aromatase inhibitors: Secondary | ICD-10-CM | POA: Diagnosis not present

## 2018-08-11 DIAGNOSIS — I2510 Atherosclerotic heart disease of native coronary artery without angina pectoris: Secondary | ICD-10-CM | POA: Diagnosis not present

## 2018-08-11 DIAGNOSIS — E119 Type 2 diabetes mellitus without complications: Secondary | ICD-10-CM | POA: Diagnosis not present

## 2018-08-11 DIAGNOSIS — R69 Illness, unspecified: Secondary | ICD-10-CM | POA: Diagnosis not present

## 2018-08-11 DIAGNOSIS — R51 Headache: Secondary | ICD-10-CM | POA: Diagnosis not present

## 2018-08-11 DIAGNOSIS — I313 Pericardial effusion (noninflammatory): Secondary | ICD-10-CM | POA: Diagnosis not present

## 2018-08-11 DIAGNOSIS — I25119 Atherosclerotic heart disease of native coronary artery with unspecified angina pectoris: Secondary | ICD-10-CM | POA: Diagnosis not present

## 2018-08-12 DIAGNOSIS — E119 Type 2 diabetes mellitus without complications: Secondary | ICD-10-CM | POA: Diagnosis not present

## 2018-08-12 DIAGNOSIS — I10 Essential (primary) hypertension: Secondary | ICD-10-CM | POA: Diagnosis not present

## 2018-08-12 DIAGNOSIS — R079 Chest pain, unspecified: Secondary | ICD-10-CM | POA: Diagnosis not present

## 2018-08-12 DIAGNOSIS — Z853 Personal history of malignant neoplasm of breast: Secondary | ICD-10-CM | POA: Diagnosis not present

## 2018-08-13 ENCOUNTER — Encounter: Payer: Self-pay | Admitting: Cardiology

## 2018-08-13 DIAGNOSIS — Z853 Personal history of malignant neoplasm of breast: Secondary | ICD-10-CM | POA: Diagnosis not present

## 2018-08-13 DIAGNOSIS — I313 Pericardial effusion (noninflammatory): Secondary | ICD-10-CM | POA: Diagnosis not present

## 2018-08-13 DIAGNOSIS — E119 Type 2 diabetes mellitus without complications: Secondary | ICD-10-CM | POA: Diagnosis not present

## 2018-08-13 DIAGNOSIS — M1611 Unilateral primary osteoarthritis, right hip: Secondary | ICD-10-CM | POA: Diagnosis not present

## 2018-08-13 DIAGNOSIS — I10 Essential (primary) hypertension: Secondary | ICD-10-CM | POA: Diagnosis not present

## 2018-08-13 DIAGNOSIS — R079 Chest pain, unspecified: Secondary | ICD-10-CM | POA: Diagnosis not present

## 2018-08-14 DIAGNOSIS — I10 Essential (primary) hypertension: Secondary | ICD-10-CM | POA: Diagnosis not present

## 2018-08-14 DIAGNOSIS — R69 Illness, unspecified: Secondary | ICD-10-CM | POA: Diagnosis not present

## 2018-08-14 DIAGNOSIS — E119 Type 2 diabetes mellitus without complications: Secondary | ICD-10-CM | POA: Diagnosis not present

## 2018-08-14 DIAGNOSIS — R079 Chest pain, unspecified: Secondary | ICD-10-CM | POA: Diagnosis not present

## 2018-08-14 DIAGNOSIS — Z853 Personal history of malignant neoplasm of breast: Secondary | ICD-10-CM | POA: Diagnosis not present

## 2018-08-29 ENCOUNTER — Telehealth: Payer: Self-pay

## 2018-08-29 NOTE — Telephone Encounter (Signed)
This echo ws ordered in November 2019 when pt saw Dr Marlou Porch.  She scheduled the appt and then cancelled or No Showed for it multiple times. No indication of where or when the pt was in the hospital and had echo completed.  Will await results to be faxed.

## 2018-08-29 NOTE — Telephone Encounter (Signed)
New message    Call the patient to set up an echocardiogram patient informed the echo was done while in the hospital and will have the hospital to fax over the results to Dr. Marlou Porch.

## 2018-08-30 DIAGNOSIS — E119 Type 2 diabetes mellitus without complications: Secondary | ICD-10-CM | POA: Diagnosis not present

## 2018-09-04 ENCOUNTER — Telehealth: Payer: Self-pay

## 2018-09-04 NOTE — Telephone Encounter (Signed)
Called pt to schedule her a hospital f/u and she requested a date in March. I advised her that we really should not schedule her that far out but she she is unable to come in before then. YRL,RMA

## 2018-09-06 ENCOUNTER — Ambulatory Visit: Payer: Medicare HMO | Admitting: Nurse Practitioner

## 2018-09-11 ENCOUNTER — Telehealth: Payer: Self-pay | Admitting: Oncology

## 2018-09-11 NOTE — Telephone Encounter (Signed)
Called patient per VM scheduling log.  Patient decided to keep her appt date and time the same.

## 2018-09-20 ENCOUNTER — Other Ambulatory Visit: Payer: Self-pay | Admitting: Cardiology

## 2018-09-28 ENCOUNTER — Other Ambulatory Visit: Payer: Self-pay

## 2018-09-28 ENCOUNTER — Ambulatory Visit (INDEPENDENT_AMBULATORY_CARE_PROVIDER_SITE_OTHER): Payer: Medicare HMO | Admitting: Nurse Practitioner

## 2018-09-28 ENCOUNTER — Encounter: Payer: Self-pay | Admitting: Nurse Practitioner

## 2018-09-28 VITALS — BP 134/64 | HR 73 | Temp 98.1°F | Ht 62.0 in | Wt 205.6 lb

## 2018-09-28 DIAGNOSIS — G8929 Other chronic pain: Secondary | ICD-10-CM | POA: Diagnosis not present

## 2018-09-28 DIAGNOSIS — I2510 Atherosclerotic heart disease of native coronary artery without angina pectoris: Secondary | ICD-10-CM | POA: Diagnosis not present

## 2018-09-28 DIAGNOSIS — E785 Hyperlipidemia, unspecified: Secondary | ICD-10-CM

## 2018-09-28 DIAGNOSIS — E1142 Type 2 diabetes mellitus with diabetic polyneuropathy: Secondary | ICD-10-CM | POA: Diagnosis not present

## 2018-09-28 DIAGNOSIS — I10 Essential (primary) hypertension: Secondary | ICD-10-CM | POA: Diagnosis not present

## 2018-09-28 DIAGNOSIS — K08409 Partial loss of teeth, unspecified cause, unspecified class: Secondary | ICD-10-CM | POA: Diagnosis not present

## 2018-09-28 DIAGNOSIS — C50919 Malignant neoplasm of unspecified site of unspecified female breast: Secondary | ICD-10-CM | POA: Diagnosis not present

## 2018-09-28 DIAGNOSIS — E1165 Type 2 diabetes mellitus with hyperglycemia: Secondary | ICD-10-CM | POA: Diagnosis not present

## 2018-09-28 DIAGNOSIS — E0841 Diabetes mellitus due to underlying condition with diabetic mononeuropathy: Secondary | ICD-10-CM | POA: Diagnosis not present

## 2018-09-28 DIAGNOSIS — R69 Illness, unspecified: Secondary | ICD-10-CM | POA: Diagnosis not present

## 2018-09-28 MED ORDER — SEMAGLUTIDE(0.25 OR 0.5MG/DOS) 2 MG/1.5ML ~~LOC~~ SOPN: 0.5000 mg | PEN_INJECTOR | SUBCUTANEOUS | 4 refills | Status: DC

## 2018-09-28 NOTE — Progress Notes (Signed)
Subjective:     Patient ID: Kathy Maxwell , female    DOB: 01/16/1956 , 63 y.o.   MRN: [id number]   Chief Complaint  Patient presents with  . Hospitalization Follow-up    cheast pain ,htn , she is feeling better today     HPI  She is here today since being in the hospital in January 2020, after having chest pain during a funeral.  Her blood pressure was up to 220/78.  She had MRI, CT scan, EKG and stress test all was normal.  Was discharged to home.  Dr felt may be related to stress - she had lost her cousin and best friend and then her son passed.  (2 were sudden).  Since being home she has been feeling fine.  Was admitted for 3 days.    She has been still with her Nephew in [facility] caring for him.  She is planning to go to [place] the first week in April.  She has an appt with Cardiology.   Diabetes  She presents for her follow-up diabetic visit. She has type 2 diabetes mellitus. Her disease course has been stable. Pertinent negatives for hypoglycemia include no confusion. Pertinent negatives for diabetes include no blurred vision, no chest pain and no polydipsia. Symptoms are stable. There are no known risk factors for coronary artery disease.     Past Medical History:  Diagnosis Date  . Allergy    latex  . Arthritis   . Bipolar 1 disorder ([place])   . Bipolar 1 disorder ([place]) 04/04/2013  . Blood transfusion without reported diagnosis    2014 during chemotherapy  . Breast cancer ([place]) 03/10/13   left breast invasive ca  . Cancer ([place])   . Coronary artery disease   . Diabetes mellitus without complication ([facility])    NIDDM  . GERD (gastroesophageal reflux disease)   . Heart murmur   . Hyperlipidemia   . Hyperlipidemia 04/04/2013  . Hypertension   . Hypertension 04/04/2013  . Obesity   . Personal history of chemotherapy   . Personal history of radiation therapy   . Radiation 01/30/14-03/19/14  . Sleep apnea    no cpap due to insurance     Family History  Problem Relation Age  of Onset  . Heart disease Mother   . Breast cancer Maternal Aunt        dx in her 42s  . Heart disease Cousin 58       maternal cousin  . Heart attack Sister 55  . Cancer Paternal Aunt        NOS  . Cancer Maternal Aunt        NOS  . Lung cancer Cousin 43       maternal cousin - non smoker  . Brain cancer Cousin 13       maternal cousin's son  . Lung cancer Cousin 12       maternal cousin's son  . Breast cancer Cousin        paternal cousin  . Colon cancer Neg Hx      Current Outpatient Medications:  .  amLODipine (NORVASC) 10 MG tablet, Take 10 mg by mouth daily., Disp: , Rfl:  .  buPROPion (WELLBUTRIN SR) 150 MG 12 hr tablet, Take 1 tablet (150 mg total) by mouth daily., Disp: 30 tablet, Rfl: 6 .  cholecalciferol (VITAMIN D) 1000 units tablet, Take 1 tablet (1,000 Units total) by mouth daily., Disp: 100 tablet, Rfl: 4 .  gabapentin (NEURONTIN) 100 MG capsule, Take 3 capsules (300 mg total) by mouth 3 (three) times daily., Disp: 270 capsule, Rfl: 6 .  hydrALAZINE (APRESOLINE) 25 MG tablet, Take 25 mg by mouth 3 (three) times daily. , Disp: , Rfl:  .  lidocaine-prilocaine (EMLA) cream, Apply topically as needed. Apply to port-a-cath one - two hours before accessing., Disp: 30 g, Rfl: 3 .  losartan (COZAAR) 100 MG tablet, TAKE 1 TABLET BY MOUTH EVERY DAY, Disp: 90 tablet, Rfl: 1 .  metoprolol succinate (TOPROL-XL) 25 MG 24 hr tablet, TAKE 1 TABLET BY MOUTH EVERY DAY, Disp: 90 tablet, Rfl: 1 .  naproxen (NAPROSYN) 500 MG tablet, TAKE 1 TABLET (500 MG TOTAL) BY MOUTH 2 (TWO) TIMES DAILY WITH A MEAL., Disp: 60 tablet, Rfl: 0 .  pantoprazole (PROTONIX) 40 MG tablet, Take 1 tablet (40 mg total) by mouth daily., Disp: 30 tablet, Rfl: 3 .  pioglitazone-metformin (ACTOPLUS MET) 15-850 MG per tablet, Take 1 tablet by mouth every morning. , Disp: , Rfl:  .  rosuvastatin (CRESTOR) 20 MG tablet, Take 1 tablet (20 mg total) by mouth daily., Disp: 30 tablet, Rfl: 12 .  sitaGLIPtin (JANUVIA) 100  MG tablet, Take 100 mg by mouth daily., Disp: , Rfl:  .  anastrozole (ARIMIDEX) 1 MG tablet, TAKE 1 TABLET (1 MG TOTAL) BY MOUTH DAILY., Disp: 90 tablet, Rfl: 4 No current facility-administered medications for this visit.   Facility-Administered Medications Ordered in Other Visits:  .  sodium chloride 0.9 % injection 10 mL, 10 mL, Intravenous, PRN, Marcy Panning, MD, 10 mL at 05/24/13 1522   Allergies  Allergen Reactions  . Cephalexin Other (See Comments)    Burned throat.  . Adhesive [Tape] Rash    Clear tape gloves ,elastic no problem  . Latex Hives and Rash     Review of Systems  Eyes: Negative for blurred vision.  Cardiovascular: Negative for chest pain.  Endocrine: Negative for polydipsia.  Psychiatric/Behavioral: Negative for confusion.     Today's Vitals   09/28/18 1458  BP: 134/64  Pulse: 73  Temp: 98.1 F (36.7 C)  TempSrc: Oral  SpO2: 94%  Weight: 205 lb 9.6 oz (93.3 kg)  Height: '5\' 2"'$  (1.575 m)   Body mass index is 37.6 kg/m.   Objective:  Physical Exam Constitutional:      Appearance: Normal appearance.  Eyes:     Pupils: Pupils are equal, round, and reactive to light.  Pulmonary:     Effort: Pulmonary effort is normal. No respiratory distress.     Breath sounds: Normal breath sounds. No wheezing.  Skin:    General: Skin is warm and dry.  Neurological:     General: No focal deficit present.     Mental Status: She is alert and oriented to person, place, and time.  Psychiatric:        Mood and Affect: Mood normal.        Behavior: Behavior normal.        Thought Content: Thought content normal.        Judgment: Judgment normal.         Assessment And Plan:    1. Diabetic mononeuropathy associated with diabetes mellitus due to underlying condition ([place])  Chronic, fair control  Continue with current medications  Encouraged to limit intake of sugary foods and drinks  Encouraged to increase physical activity to 150 minutes per week as  tolerated - Referral to Chronic Care Management Services - Semaglutide,0.25 or 0.'5MG'$ /DOS, (OZEMPIC, 0.25  OR 0.5 MG/DOSE,) 2 MG/1.5ML SOPN; Inject 0.5 mg into the skin once a week.  Dispense: 1 pen; Refill: 4 - Hemoglobin A1c - Lipid panel  2. Hyperlipidemia, unspecified hyperlipidemia type  Chronic, controlled  Continue with current medications - Lipid panel  3. Essential hypertension . B/P is fairly controlled.  . CMP ordered to check renal function she will have done at the oncologist from her portacath . The importance of regular exercise and dietary modification was stressed to the patient.  . Stressed importance of losing ten percent of her body weight to help with B/P control.  . The weight loss would help with decreasing cardiac and cancer risk as well.  - CMP14 + Anion Gap     [name], FNP

## 2018-10-03 ENCOUNTER — Other Ambulatory Visit: Payer: Self-pay | Admitting: Internal Medicine

## 2018-10-03 DIAGNOSIS — M25551 Pain in right hip: Secondary | ICD-10-CM

## 2018-10-03 NOTE — Telephone Encounter (Signed)
Naproxen refill

## 2018-10-05 ENCOUNTER — Ambulatory Visit: Payer: Self-pay

## 2018-10-05 DIAGNOSIS — E0841 Diabetes mellitus due to underlying condition with diabetic mononeuropathy: Secondary | ICD-10-CM

## 2018-10-05 DIAGNOSIS — I10 Essential (primary) hypertension: Secondary | ICD-10-CM

## 2018-10-05 NOTE — Chronic Care Management (AMB) (Signed)
  Chronic Care Management   Social Work Note  10/05/2018 Name: MAUDELL STANBROUGH MRN: [id number] DOB: 1956-01-07  [name] is a 63 y.o. year old female who sees [name], FNP for primary care. The CCM team was consulted for assistance with chronic disease management and care coordination.  CCM team collaboration to outreach the patient and enroll into the CCM program. The patient requests call be completed at a later date.  Follow Up Plan: CCM team will follow up with the patient within the next week as requested.  Daneen Schick, BSW, CDP [facility] Care Management Social Worker [phone]  Total time spent performing care coordination and/or care management activities with the patient by phone or face to face = 5 minutes.

## 2018-10-05 NOTE — Chronic Care Management (AMB) (Signed)
  Care Management   Note  10/05/2018 Name: Kathy Maxwell MRN: [id number] DOB: 01/15/1956   Chronic Care Management   Outreach Note  10/05/2018 Name: Kathy Maxwell MRN: [id number] DOB: [birth year]  Referred by: [name], FNP Reason for referral : Care Coordination ([place])   An unsuccessful telephone outreach was attempted today. The patient was referred to the case management team by [name] FNP for assistance with Diabetic mononeuropathy associated with diabetes mellitus due to underlying condition.   Follow Up Plan: The CM team will reach out to the patient again over the next 5-7 days.    Barb Merino, RN,CCM Care Management Coordinator [facility]  Direct Phone: [phone]

## 2018-10-05 NOTE — Progress Notes (Signed)
Kathy Maxwell  [phone] [phone]    ID: Kathy Maxwell DOB: 11-14-1955  MR#: [id number]  TGG#:[id number]  Patient Care Team: Glendale Chard, MD as PCP - General (Internal Medicine) Fanny Skates, MD as Consulting Physician (General Surgery) Daneen Schick as Social Worker [place], Claudette Stapler, RN as Case Manager Jerline Pain, MD as Consulting Physician (Cardiology)   CHIEF COMPLAINT: Locally advanced estrogen receptor positive breast cancer  CURRENT TREATMENT: anastrozole   BREAST CANCER HISTORY: From Dr. Dana Allan original intake note, 04/04/2013:  "Kathy Maxwell is a 63 y.o. female. With other medical problems who underwent a screening mammogram that led to a diagnostic mammogram and ultrasound of the left breast. The mammogram and ultrasound showed a 2.9 x 1.6 x 1.9 cm suspicious mass in the left breast at the 12:30 oh clock position 6 cm from the nipple. There was also a suspicious left axillary lymph node. She had image guided biopsy of the left breast mass and the lymph node. The pathology revealed an invasive mammary carcinoma probably ductal phenotype. Tumor was estrogen receptor +100% progesterone receptor +98% proliferation marker Ki-67 13% and HER-2/neu negative. Patient had MRI of the breasts performed on 03/15/2013 the left breast revealed a 7.4 cm area of what the lymph node. Right breast revealed and a 3.3 cm mass. Patient was seen by Dr. Fanny Skates for discussion of surgical options. He has referred the patient to medical oncology for discussion whether or not she will need a Port-A-Cath and chemotherapy."  The patient underwent left modified radical mastectomy 04/22/2014 showing multiple foci of low-grade invasive ductal carcinoma, the largest single lesion measuring 5.5 cm, and involving 4/22 lymph nodes. Repeat HER-2 study was again negative. 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 The patient proceeded to adjuvant chemotherapy completed May 2015 as summarized below. She is currently receiving radiation therapy.  The patient's subsequent history is detailed below.   INTERVAL HISTORY: Kathy Maxwell returns today for follow-up and treatment of her estrogen receptor positive breast cancer.   She continues on anastrozole. She tolerates this well and without any noticeable side effects. Her insurance covers the cost of this medication completely.   Kathy Maxwell's last bone density screening on 04/03/2014, showed a T-score of -1.4, which is considered osteopenic.    Since her last visit here, she underwent a digital diagnostic unilateral right mammogram with tomography on 09/13/2017 showing: Breast Density Category B. There is no mammographic evidence of malignancy.  Kathy Maxwell was visiting family in [place] for a funeral and then a second family member died.  She developed chest pain and went to the ED where she was found to have very high blood pressure on 08/11/2018.   On 08/11/2018, she underwent a chest xray showing Tubes and lines: Right-sided Mediport tip overlies the right atrium. There are decreased lung volumes with bronchovascular crowding. There is no airspace disease, pneumothorax or effusion. The cardiomediastinal silhouette is enlarged, unchanged. The pulmonary vessels are normal size. The bony thorax demonstrates stable degenerative changes.  She also underwent a chest MRA  Without contrast on the same day showing: The thoracic aorta demonstrates normal caliber without aneurysm dilatation, transection or dissection. There is admixture flow artifact in the ascending aortic arch. A right-sided  Mediport with the tip in the inferior SVC is noted. There is cardiomegaly  with a small to moderate sized pericardial effusion.  In addition, she underwent a head CT without contrast on the same day showing: The bilateral cerebral and cerebellar hemispheres demonstrate mild cortical neuronal volume loss with mild white matter small vessel angiopathy. Right frontoparietal subcortical white matter asymmetric hypodensity with preservation of the cortical medullary differentiation. There is preservation of the gray-white matter interface without an acute territorial infarct, mass lesion or acute intracranial hemorrhage. The ventricles and extra-axial CSF spaces are normally sized and symmetric without midline shift. The paranasal sinuses are normally developed and pneumatized without acute sinonasal disease. The orbits are symmetric. Left parietal calvarium 0.9 cm well-defined lytic lesion. There is no calvarial fracture.  She also underwent an ECG on the same day showing regular rhythm and left ventricular hypertrophy with repolarization abnormality.   Then, on 08/13/2018, she underwent an echocardiogram showing an ejection fraction in the 55% - 60% range.   Then on 08/14/2018 she underwent a myocardial perfusion showing: negative for left ventricular myocardial ischemia or scar/prior infarct. Intact left ventricular systolic function.  Finally, she underwent a stress test on 08/17/2018 with results not available but benign per patient report.   REVIEW OF SYSTEMS: The patient denies unusual headaches, visual changes, nausea, vomiting, or dizziness. There has been no unusual cough, phlegm production, or pleurisy. This been no change in bowel or bladder habits. The patient denies unexplained fatigue or unexplained weight loss, bleeding, rash, or fever. A detailed review of systems was otherwise noncontributory.    PAST MEDICAL HISTORY: Past Medical History:  Diagnosis Date  . Allergy    latex  . Arthritis   . Bipolar 1 disorder ([place])   . Bipolar 1 disorder ([place])  04/04/2013  . Blood transfusion without reported diagnosis    2014 during chemotherapy  . Breast cancer ([place]) 03/10/13   left breast invasive ca  . Cancer ([place])   . Coronary artery disease   . Diabetes mellitus without complication (HCC)    NIDDM  . GERD (gastroesophageal reflux disease)   . Heart murmur   . Hyperlipidemia   . Hyperlipidemia 04/04/2013  . Hypertension   . Hypertension 04/04/2013  . Obesity   . Personal history of chemotherapy   . Personal history of radiation therapy   . Radiation 01/30/14-03/19/14  . Sleep apnea    no cpap due to insurance    PAST SURGICAL HISTORY: Past Surgical History:  Procedure Laterality Date  . ABDOMINAL HYSTERECTOMY    . BREAST BIOPSY Left 05/08/09   fibroadenoma with microcalcifications,no malignancy id  . BREAST BIOPSY Left 03/07/13   invasive ca,1 lymph node metastatic  . 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 BREAST LUMPECTOMY Right 2014  . BREAST LUMPECTOMY WITH NEEDLE LOCALIZATION AND AXILLARY SENTINEL LYMPH NODE BX Right 04/22/2013   Procedure: BREAST LUMPECTOMY WITH NEEDLE LOCALIZATION AND AXILLARY SENTINEL LYMPH NODE BX;  Surgeon: Adin Hector, MD;  Location: [facility];  Service: General;  Laterality: Right;  . CARDIAC CATHETERIZATION    . CHOLECYSTECTOMY    . CORONARY ANGIOPLASTY     LAD stent 2003  . CORONARY STENT PLACEMENT  2001  . ESOPHAGOGASTRODUODENOSCOPY N/A 08/07/2013   Procedure: ESOPHAGOGASTRODUODENOSCOPY (EGD);  Surgeon: Missy Sabins, MD;  Location: [facility];  Service: Endoscopy;  Laterality: N/A;  . ESOPHAGOGASTRODUODENOSCOPY N/A 11/13/2013   Procedure: ESOPHAGOGASTRODUODENOSCOPY (EGD);  Surgeon: Wonda Horner, MD;  Location: [facility]  ENDOSCOPY;  Service: Endoscopy;  Laterality: N/A;  . EYE SURGERY  6/13,9/14   laser,cataract lft  . MASTECTOMY Left 2014  . MASTECTOMY MODIFIED RADICAL Left 04/22/2013   Procedure: MASTECTOMY MODIFIED RADICAL;  Surgeon: Adin Hector,  MD;  Location: [facility];  Service: General;  Laterality: Left;  . PORTACATH PLACEMENT Right 04/22/2013   Procedure: INSERTION PORT-A-CATH;  Surgeon: Adin Hector, MD;  Location: [facility];  Service: General;  Laterality: Right;    FAMILY HISTORY Family History  Problem Relation Age of Onset  . Heart disease Mother   . Breast cancer Maternal Aunt        dx in her 75s  . Heart disease Cousin 28       maternal cousin  . Heart attack Sister 65  . Cancer Paternal Aunt        NOS  . Cancer Maternal Aunt        NOS  . Lung cancer Cousin 63       maternal cousin - non smoker  . Brain cancer Cousin 13       maternal cousin's son  . Lung cancer Cousin 67       maternal cousin's son  . Breast cancer Cousin        paternal cousin  . Colon cancer Neg Hx    The patient's father died from alcoholic cirrhosis at the age of 64. The patient's mother died from congestive heart failure the age of 48. The patient had no brothers, 4 sisters. One sister died from a myocardial infarction at the age of 3. The only other breast cancers in the family are a maternal aunt(Otto 1) with breast cancer diagnosed after the age of 26 and a second cousin on the mother's side, diagnosed with breast cancer approximately age 59. There is no history of ovarian cancer in the family. The patient has undergone genetic testing and his BRCA negative   GYNECOLOGIC HISTORY:  No LMP recorded. Patient is postmenopausal. Menarche age 60. The patient is GX P0. She underwent hysterectomy with bilateral salpingo-oophorectomy in 1997. She took hormone replacement briefly.   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 She is single. Currently she shares a home with her  nephew, who is disabled on hemodialysis; with the patient's goddaughter who is a CNA studying to be a Marine scientist, and with the patient is good friend who is her goddaughter's mother. The patient attends a local church of [place]: Not in place. At her 02/12/2014 visit the patient was given the appropriate documents to complete and notarize at her discretion. She tells me she intends to name her goddaughter, Kathy Maxwell, as her healthcare power of attorney. [name] can be reached at [place]: Social History   Tobacco Use  . Smoking status: Former Smoker    Packs/day: 1.00    Years: 31.00    Pack years: 31.00    Types: Cigarettes    Last attempt to quit: 07/19/1987    Years since quitting: 31.2  . Smokeless tobacco: Never Used  Substance Use Topics  . Alcohol use: No    Alcohol/week: 0.0 standard drinks  . Drug use: No    Colonoscopy: Never  PAP:  Bone density: Never  Lipid panel:  Allergies  Allergen Reactions  . Cephalexin Other (See Comments)    Burned throat.  . Adhesive [Tape] Rash    Clear tape  gloves ,elastic no problem  . Latex Hives and Rash    Current Outpatient Medications  Medication Sig Dispense Refill  . amLODipine (NORVASC) 10 MG tablet Take 10 mg by mouth daily.    [name] anastrozole (ARIMIDEX) 1 MG tablet Take 1 tablet (1 mg total) by mouth daily. 90 tablet 4  . buPROPion (WELLBUTRIN SR) 150 MG 12 hr tablet Take 1 tablet (150 mg total) by mouth daily. 30 tablet 6  . cholecalciferol (VITAMIN D3) 25 MCG (1000 UT) tablet Take 1 tablet (1,000 Units total) by mouth daily. 100 tablet 4  . gabapentin (NEURONTIN) 100 MG capsule Take 3 capsules (300 mg total) by mouth 3 (three) times daily. 270 capsule 6  . hydrALAZINE (APRESOLINE) 25 MG tablet Take 25 mg by mouth 3 (three) times daily.     [name] lidocaine-prilocaine (EMLA) cream Apply topically as needed. Apply to port-a-cath one - two hours before accessing. 30 g 3  . losartan (COZAAR) 100 MG tablet TAKE 1 TABLET BY MOUTH EVERY DAY 90 tablet 1  . metoprolol succinate (TOPROL-XL) 25 MG 24 hr tablet TAKE 1 TABLET BY MOUTH EVERY DAY 90 tablet 1  . naproxen (NAPROSYN) 500 MG tablet TAKE 1 TABLET (500 MG  TOTAL) BY MOUTH 2 (TWO) TIMES DAILY WITH A MEAL. 60 tablet 0  . pantoprazole (PROTONIX) 40 MG tablet Take 1 tablet (40 mg total) by mouth daily. 30 tablet 3  . pioglitazone-metformin (ACTOPLUS MET) 15-850 MG per tablet Take 1 tablet by mouth every morning.     . rosuvastatin (CRESTOR) 20 MG tablet Take 1 tablet (20 mg total) by mouth daily. 30 tablet 12  . Semaglutide,0.25 or 0.5MG/DOS, (OZEMPIC, 0.25 OR 0.5 MG/DOSE,) 2 MG/1.5ML SOPN Inject 0.5 mg into the skin once a week. 1 pen 4  . sitaGLIPtin (JANUVIA) 100 MG tablet Take 100 mg by mouth daily.     No current facility-administered medications for this visit.    Facility-Administered Medications Ordered in Other Visits  Medication Dose Route Frequency Provider Last Rate Last Dose  . sodium chloride 0.9 % injection 10 mL  10 mL Intravenous PRN Marcy Panning, MD   10 mL at 05/24/13 1522    OBJECTIVE: Middle-aged African-American woman who appears stated age  23:   10/08/18 1209  BP: (!) 177/73  Pulse: 78  Resp: 18  Temp: 98.3 F (36.8 C)  SpO2: 100%     Body mass index is 38.12 kg/m.    ECOG FS:1 - Symptomatic but completely ambulatory Filed Weights   10/08/18 1209  Weight: 208 lb 6.4 oz (94.5 kg)   Sclerae unicteric, pupils round and equal Oropharynx clear and moist No cervical or supraclavicular adenopathy Lungs no rales or rhonchi Heart regular rate and rhythm, harsh systolic murmur noted Abd soft, nontender, positive bowel sounds MSK no focal spinal tenderness, no upper extremity lymphedema Neuro: nonfocal, well oriented, appropriate affect Breasts: The right breast is status post lumpectomy.  There is no evidence of disease recurrence.  The left breast is status post mastectomy.  There is no evidence of chest wall recurrence.  Both axillae are benign.   LAB RESULTS:  CMP     Component Value Date/Time   NA 135 03/01/2018 1140   NA 136 09/06/2016 1114   K 4.4 03/01/2018 1140   K 4.1 09/06/2016 1114   CL 102  03/01/2018 1140   CO2 26 03/01/2018 1140   CO2 24 09/06/2016 1114   GLUCOSE 243 (H) 03/01/2018 1140   GLUCOSE 286 (  H) 09/06/2016 1114   BUN 23 03/01/2018 1140   BUN 15.7 09/06/2016 1114   CREATININE 1.15 (H) 03/01/2018 1140   CREATININE 1.44 (H) 07/28/2017 1017   CREATININE 0.9 09/06/2016 1114   CALCIUM 9.6 03/01/2018 1140   CALCIUM 9.7 09/06/2016 1114   PROT 7.2 03/01/2018 1140   PROT 7.1 09/06/2016 1114   ALBUMIN 3.6 03/01/2018 1140   ALBUMIN 3.5 09/06/2016 1114   AST 12 (L) 03/01/2018 1140   AST 12 07/28/2017 1017   AST 12 09/06/2016 1114   ALT 15 03/01/2018 1140   ALT 15 07/28/2017 1017   ALT 16 09/06/2016 1114   ALKPHOS 129 (H) 03/01/2018 1140   ALKPHOS 146 09/06/2016 1114   BILITOT 0.5 03/01/2018 1140   BILITOT 0.5 07/28/2017 1017   BILITOT 0.58 09/06/2016 1114   GFRNONAA 49 (L) 03/01/2018 1140   GFRNONAA 38 (L) 07/28/2017 1017   GFRAA 57 (L) 03/01/2018 1140   GFRAA 44 (L) 07/28/2017 1017   No results found for: SPEP  Lab Results  Component Value Date   WBC 8.4 03/01/2018   NEUTROABS 5.4 03/01/2018   HGB 12.0 03/01/2018   HCT 36.6 03/01/2018   MCV 91.0 03/01/2018   PLT 313 03/01/2018      Chemistry      Component Value Date/Time   NA 135 03/01/2018 1140   NA 136 09/06/2016 1114   K 4.4 03/01/2018 1140   K 4.1 09/06/2016 1114   CL 102 03/01/2018 1140   CO2 26 03/01/2018 1140   CO2 24 09/06/2016 1114   BUN 23 03/01/2018 1140   BUN 15.7 09/06/2016 1114   CREATININE 1.15 (H) 03/01/2018 1140   CREATININE 1.44 (H) 07/28/2017 1017   CREATININE 0.9 09/06/2016 1114      Component Value Date/Time   CALCIUM 9.6 03/01/2018 1140   CALCIUM 9.7 09/06/2016 1114   ALKPHOS 129 (H) 03/01/2018 1140   ALKPHOS 146 09/06/2016 1114   AST 12 (L) 03/01/2018 1140   AST 12 07/28/2017 1017   AST 12 09/06/2016 1114   ALT 15 03/01/2018 1140   ALT 15 07/28/2017 1017   ALT 16 09/06/2016 1114   BILITOT 0.5 03/01/2018 1140   BILITOT 0.5 07/28/2017 1017   BILITOT 0.58  09/06/2016 1114       No results found for: LABCA2  No components found for: LABCA125  No results for input(s): INR in the last 168 hours.  Urinalysis    Component Value Date/Time   COLORURINE YELLOW 10/08/2015 2032   APPEARANCEUR CLEAR 10/08/2015 2032   LABSPEC 1.017 10/08/2015 2032   PHURINE 6.5 10/08/2015 2032   GLUCOSEU NEGATIVE 10/08/2015 2032   HGBUR TRACE (A) 10/08/2015 2032   BILIRUBINUR negative 06/06/2018 1232   KETONESUR NEGATIVE 10/08/2015 2032   PROTEINUR Positive (A) 06/06/2018 1232   PROTEINUR 100 (A) 10/08/2015 2032   UROBILINOGEN 0.2 06/06/2018 1232   UROBILINOGEN 1.0 11/11/2013 1054   NITRITE positive 06/06/2018 1232   NITRITE POSITIVE (A) 10/08/2015 2032   LEUKOCYTESUR Trace (A) 06/06/2018 1232    STUDIES: No results found.   ASSESSMENT: 63 y.o. BRCA negative [place] woman  LEFT BREAST (1) status post left breast upper outer quadrant and left axillary lymph node biopsy 03/07/2013, both positive for an invasive ductal carcinoma, grade 1 or 2, estrogen receptor and progesterone receptor both strongly positive, HER-2 negative, with an MIB-1 between 13% and 15%  (2) biopsy of a more anterior mass in the left breast 04/03/2013 showed invasive ductal carcinoma, grade 2,  estrogen and progesterone receptor positive, HER-2 negative, with an MIB-1 of 33%  (3) status post left modified radical mastectomy 04/22/2013 for mpT3 pN2, stage IIIA invasive ductal carcinoma, grade 1, repeat HER-2 again negative; 4 of 22 lymph nodes positive; ample margins   RIGHT BREAST (4) status post right central breast biopsy 04/03/2013 for ductal carcinoma in situ, 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% positive, progesterone receptor 11% positive   (5) right lumpectomy and sentinel lymph node sampling 04/22/2013 showed ductal carcinoma in situ, 3 cm, margins less than 1 mm medially  (a) skin biopsy right breast 06/17/2014 showed no evidence of malignancy.  ADJUVANT CHEMOTHERAPY (6)  cyclophosphamide, epirubicin, and fluorouracil given every 14 days x6 cycles completed 08/02/2013  (7) weekly paclitaxel x2, discontinued 08/23/2013, with poor tolerance   (8) Abraxane given day 1 day 8 of each 21 day cycle, with some delays, for a total 8 doses, completed 11/23/2023  ADJUVANT RADIATION (9) adjuvant radiation therapy completed 03/19/2014  ANTI-ESTROGEN THERAPY (10) started anastrozole 04/16/2014;   (a) dexa scan 04/03/2014 showed mild osteopenia (T - 1.4)  GENETIC TESTING (11) a broad genetic panel sent December 2014 showed a variant of uncertain significance, 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  There were no deleterious mutations noted in the other genes tested, which included ATM, BARD1, BRCA1, BRCA2, BRIP1, CDH1, CHEK2, EPCAM, FANCC, MLH1, MSH2, MSH6, NBN, PALB2, PMS2, PTEN, RAD51C, RAD51D, STK11, TP53, and XRCC2.    PLAN: Kathy Maxwell is now 5-1/2 years out from her left modified radical mastectomy, with no evidence of disease recurrence.  This is very favorable.  She continues on anastrozole, the goal being for a total of 7 years on that medication.  She just had a variety of studies to evaluate chest pain and the did not show any evidence of metastatic disease.  She does have a tight sounding systolic murmur and is already scheduled to meet with cardiology.  I strongly urged her to keep those appointments  Otherwise she is behind on her mammography and I we have put in the order for her to have a right mammogram within the next week.  She will see [name] again in approximately 1 year  She knows to call for any other issues that may develop before that visit.   Kathy Maxwell, Virgie Dad, MD  10/08/18 12:35 PM Medical Oncology and Hematology [facility] [address] [place], [place] [address] Tel. [phone]    Fax. [phone]  I, Jacqualyn Posey am acting as a Education administrator for Chauncey Cruel, MD.   I, Lurline Del MD, have reviewed the above documentation for accuracy and  completeness, and I agree with the above.

## 2018-10-08 ENCOUNTER — Inpatient Hospital Stay: Payer: Medicare HMO

## 2018-10-08 ENCOUNTER — Telehealth: Payer: Self-pay | Admitting: Oncology

## 2018-10-08 ENCOUNTER — Inpatient Hospital Stay: Payer: Medicare HMO | Attending: Oncology

## 2018-10-08 ENCOUNTER — Other Ambulatory Visit: Payer: Self-pay

## 2018-10-08 ENCOUNTER — Inpatient Hospital Stay (HOSPITAL_BASED_OUTPATIENT_CLINIC_OR_DEPARTMENT_OTHER): Payer: Medicare HMO | Admitting: Oncology

## 2018-10-08 VITALS — BP 177/73 | HR 78 | Temp 98.3°F | Resp 18 | Ht 62.0 in | Wt 208.4 lb

## 2018-10-08 DIAGNOSIS — I10 Essential (primary) hypertension: Secondary | ICD-10-CM | POA: Diagnosis not present

## 2018-10-08 DIAGNOSIS — M8580 Other specified disorders of bone density and structure, unspecified site: Secondary | ICD-10-CM | POA: Insufficient documentation

## 2018-10-08 DIAGNOSIS — Z87891 Personal history of nicotine dependence: Secondary | ICD-10-CM | POA: Insufficient documentation

## 2018-10-08 DIAGNOSIS — C773 Secondary and unspecified malignant neoplasm of axilla and upper limb lymph nodes: Secondary | ICD-10-CM

## 2018-10-08 DIAGNOSIS — E0841 Diabetes mellitus due to underlying condition with diabetic mononeuropathy: Secondary | ICD-10-CM | POA: Diagnosis not present

## 2018-10-08 DIAGNOSIS — R011 Cardiac murmur, unspecified: Secondary | ICD-10-CM

## 2018-10-08 DIAGNOSIS — C50412 Malignant neoplasm of upper-outer quadrant of left female breast: Secondary | ICD-10-CM

## 2018-10-08 DIAGNOSIS — D0511 Intraductal carcinoma in situ of right breast: Secondary | ICD-10-CM | POA: Insufficient documentation

## 2018-10-08 DIAGNOSIS — Z170 Estrogen receptor positive status [ER+]: Secondary | ICD-10-CM

## 2018-10-08 DIAGNOSIS — F319 Bipolar disorder, unspecified: Secondary | ICD-10-CM

## 2018-10-08 DIAGNOSIS — Z95828 Presence of other vascular implants and grafts: Secondary | ICD-10-CM

## 2018-10-08 DIAGNOSIS — D0501 Lobular carcinoma in situ of right breast: Secondary | ICD-10-CM

## 2018-10-08 DIAGNOSIS — E785 Hyperlipidemia, unspecified: Secondary | ICD-10-CM | POA: Diagnosis not present

## 2018-10-08 LAB — COMPREHENSIVE METABOLIC PANEL
ALT: 12 U/L (ref 0–44)
AST: 13 U/L — ABNORMAL LOW (ref 15–41)
Albumin: 3.5 g/dL (ref 3.5–5.0)
Alkaline Phosphatase: 115 U/L (ref 38–126)
Anion gap: 9 (ref 5–15)
BILIRUBIN TOTAL: 0.4 mg/dL (ref 0.3–1.2)
BUN: 16 mg/dL (ref 8–23)
CO2: 24 mmol/L (ref 22–32)
CREATININE: 0.99 mg/dL (ref 0.44–1.00)
Calcium: 9.1 mg/dL (ref 8.9–10.3)
Chloride: 103 mmol/L (ref 98–111)
GFR calc Af Amer: >60 mL/min (ref >60)
GFR calc non Af Amer: >60 mL/min (ref >60)
Glucose, Bld: 119 mg/dL — ABNORMAL HIGH (ref 70–99)
Potassium: 4.1 mmol/L (ref 3.5–5.1)
Sodium: 136 mmol/L (ref 135–145)
Total Protein: 7.1 g/dL (ref 6.5–8.1)

## 2018-10-08 MED ORDER — VITAMIN D 25 MCG (1000 UNIT) PO TABS: 1000.0000 [IU] | ORAL_TABLET | Freq: Every day | ORAL | 4 refills | Status: AC

## 2018-10-08 MED ORDER — SODIUM CHLORIDE 0.9% FLUSH
10.0000 mL | Freq: Once | INTRAVENOUS | Status: AC
  Administered 2018-10-08: 10 mL
  Filled 2018-10-08: qty 10

## 2018-10-08 MED ORDER — HEPARIN SOD (PORK) LOCK FLUSH 100 UNIT/ML IV SOLN
250.0000 [IU] | Freq: Once | INTRAVENOUS | Status: AC
  Administered 2018-10-08: 250 [IU]
  Filled 2018-10-08: qty 5

## 2018-10-08 MED ORDER — LIDOCAINE-PRILOCAINE 2.5-2.5 % EX CREA: TOPICAL_CREAM | CUTANEOUS | 3 refills | Status: DC | PRN

## 2018-10-08 MED ORDER — ANASTROZOLE 1 MG PO TABS: 1.0000 mg | ORAL_TABLET | Freq: Every day | ORAL | 4 refills | Status: DC

## 2018-10-08 NOTE — Telephone Encounter (Signed)
Gave avs and calendar ° °

## 2018-10-09 LAB — LIPID PANEL
Chol/HDL Ratio: 2.1 ratio (ref 0.0–4.4)
Cholesterol, Total: 117 mg/dL (ref 100–199)
HDL: 57 mg/dL (ref >39)
LDL CALC: 50 mg/dL (ref 0–99)
Triglycerides: 48 mg/dL (ref 0–149)
VLDL Cholesterol Cal: 10 mg/dL (ref 5–40)

## 2018-10-09 LAB — CMP14 + ANION GAP
ALT: 12 IU/L (ref 0–32)
AST: 16 IU/L (ref 0–40)
Albumin/Globulin Ratio: 1.5 (ref 1.2–2.2)
Albumin: 4 g/dL (ref 3.8–4.8)
Alkaline Phosphatase: 120 IU/L — ABNORMAL HIGH (ref 39–117)
Anion Gap: 18 mmol/L (ref 10.0–18.0)
BUN/Creatinine Ratio: 16 (ref 12–28)
BUN: 17 mg/dL (ref 8–27)
Bilirubin Total: 0.3 mg/dL (ref 0.0–1.2)
CO2: 21 mmol/L (ref 20–29)
CREATININE: 1.05 mg/dL — AB (ref 0.57–1.00)
Calcium: 9.1 mg/dL (ref 8.7–10.3)
Chloride: 99 mmol/L (ref 96–106)
GFR calc Af Amer: 65 mL/min/{1.73_m2} (ref >59)
GFR calc non Af Amer: 56 mL/min/{1.73_m2} — ABNORMAL LOW (ref >59)
Globulin, Total: 2.7 g/dL (ref 1.5–4.5)
Glucose: 118 mg/dL — ABNORMAL HIGH (ref 65–99)
Potassium: 4.2 mmol/L (ref 3.5–5.2)
Sodium: 138 mmol/L (ref 134–144)
Total Protein: 6.7 g/dL (ref 6.0–8.5)

## 2018-10-09 LAB — HEMOGLOBIN A1C
Est. average glucose Bld gHb Est-mCnc: 140 mg/dL
Hgb A1c MFr Bld: 6.5 % — ABNORMAL HIGH (ref 4.8–5.6)

## 2018-10-12 ENCOUNTER — Ambulatory Visit: Payer: Self-pay

## 2018-10-12 DIAGNOSIS — E1140 Type 2 diabetes mellitus with diabetic neuropathy, unspecified: Secondary | ICD-10-CM

## 2018-10-12 DIAGNOSIS — I10 Essential (primary) hypertension: Secondary | ICD-10-CM

## 2018-10-12 NOTE — Patient Instructions (Signed)
Social Worker Visit Information    Materials provided: Verbal education about CCM program provided by phone  Ms. Dowe was given information about Chronic Care Management services today including:  1. CCM service includes personalized support from designated clinical staff supervised by her physician, including individualized plan of care and coordination with other care providers 2. 24/7 contact phone numbers for assistance for urgent and routine care needs. 3. Service will only be billed when office clinical staff spend 20 minutes or more in a month to coordinate care. 4. Only one practitioner may furnish and bill the service in a calendar month. 5. The patient may stop CCM services at any time (effective at the end of the month) by phone call to the office staff. 6. The patient will be responsible for cost sharing (co-pay) of up to 20% of the service fee (after annual deductible is met).  Patient agreed to services and verbal consent obtained.   The patient verbalized understanding of instructions provided today and declined a print copy of patient instruction materials.   Follow up plan: CCM team to follow up with the patient within the next 7 - 10 days.  Daneen Schick, BSW, CDP [facility] / [facility] Care Management Social Worker [phone]

## 2018-10-12 NOTE — Chronic Care Management (AMB) (Signed)
  Chronic Care Management    Clinical Social Work General Note  10/12/2018 Name: Kathy Maxwell MRN: [id number] DOB: 1956-02-22  Kathy Maxwell is a 63 y.o. year old female who is a primary care patient of [name], [place]. The CCM team was consulted to assist the patient with chronic disease management.   Kathy Maxwell was given information about Chronic Care Management services today including:  1. CCM service includes personalized support from designated clinical staff supervised by her physician, including individualized plan of care and coordination with other care providers 2. 24/7 contact phone numbers for assistance for urgent and routine care needs. 3. Service will only be billed when office clinical staff spend 20 minutes or more in a month to coordinate care. 4. Only one practitioner may furnish and bill the service in a calendar month. 5. The patient may stop CCM services at any time (effective at the end of the month) by phone call to the office staff. 6. The patient will be responsible for cost sharing (co-pay) of up to 20% of the service fee (after annual deductible is met).  Patient agreed to services and verbal consent obtained.   Review of patient status, including review of consultants reports, relevant laboratory and other test results, and collaboration with appropriate care team members and the patient's provider was performed as part of comprehensive patient evaluation and provision of chronic care management services.    SDOH (Social Determinants of Health) screening performed today. The patient reports challenges with: Financial Strain . The patient reports she has a hard time affording some of her medications. The patient is interested in receiving services from [facility] pharmacist Lottie Dawson for medication assistance and [place] for assistance with disease management.   The patient reports she does not have a current advance directive and would like to  complete. SW attempted to offer assistance with this goal. The patient states she has the papers and her god-daughter whom is a CNA has explained the forms. The patient declines SW involvement at this time but understands she may contact this SW directly if needed.      Follow Up Plan: The patient will be engaged by [facility] team members within the next 7-10 days to establish plan of care.       Daneen Schick, BSW, CDP [facility] / [facility] Care Management Social Worker [phone]  Total time spent performing care coordination and/or care management activities with the patient by phone or face to face = 23 minutes.

## 2018-10-18 ENCOUNTER — Other Ambulatory Visit: Payer: Self-pay | Admitting: Nurse Practitioner

## 2018-10-19 ENCOUNTER — Encounter: Payer: Self-pay | Admitting: Nurse Practitioner

## 2018-10-29 ENCOUNTER — Other Ambulatory Visit: Payer: Self-pay | Admitting: Internal Medicine

## 2018-10-29 DIAGNOSIS — M25551 Pain in right hip: Secondary | ICD-10-CM

## 2018-10-30 ENCOUNTER — Other Ambulatory Visit: Payer: Self-pay | Admitting: Oncology

## 2018-11-08 ENCOUNTER — Telehealth: Payer: Self-pay | Admitting: Oncology

## 2018-11-08 DIAGNOSIS — L97829 Non-pressure chronic ulcer of other part of left lower leg with unspecified severity: Secondary | ICD-10-CM | POA: Diagnosis not present

## 2018-11-08 DIAGNOSIS — R609 Edema, unspecified: Secondary | ICD-10-CM | POA: Diagnosis not present

## 2018-11-08 DIAGNOSIS — I872 Venous insufficiency (chronic) (peripheral): Secondary | ICD-10-CM | POA: Diagnosis not present

## 2018-11-08 DIAGNOSIS — L97819 Non-pressure chronic ulcer of other part of right lower leg with unspecified severity: Secondary | ICD-10-CM | POA: Diagnosis not present

## 2018-11-08 NOTE — Telephone Encounter (Signed)
Faxed medical records to [facility]. Release PI#[id number]

## 2018-11-09 ENCOUNTER — Telehealth: Payer: Self-pay

## 2018-11-09 NOTE — Telephone Encounter (Signed)
I called pt she stated she is still taking the bupropion but it cost a little over $30 and she stated her ozempic is a little over $100. She wants to know if there is meds you can switch her to? She said she spoke with the social worker here and she told her she would have someone here at the office contact her but she never received a call. YRL,RMA

## 2018-11-12 NOTE — Telephone Encounter (Signed)
Make her aware I am working with the pharmacist on her medications

## 2018-11-13 ENCOUNTER — Ambulatory Visit (INDEPENDENT_AMBULATORY_CARE_PROVIDER_SITE_OTHER): Payer: Medicare HMO | Admitting: Pharmacist

## 2018-11-13 DIAGNOSIS — E785 Hyperlipidemia, unspecified: Secondary | ICD-10-CM | POA: Diagnosis not present

## 2018-11-13 DIAGNOSIS — I10 Essential (primary) hypertension: Secondary | ICD-10-CM | POA: Diagnosis not present

## 2018-11-13 DIAGNOSIS — E1140 Type 2 diabetes mellitus with diabetic neuropathy, unspecified: Secondary | ICD-10-CM

## 2018-11-13 NOTE — Telephone Encounter (Signed)
Attempted to call pt twice the v/m says the phone is not accepting calls at the time. YRL,RMA

## 2018-11-14 ENCOUNTER — Other Ambulatory Visit: Payer: Self-pay | Admitting: Pharmacy Technician

## 2018-11-14 NOTE — Patient Outreach (Signed)
[facility]) Care Management  11/14/2018  Kathy Maxwell [birth year] [id number]                          Medication Assistance Referral  Referral From: [place] (Clinic RPh)  Medication/Company: [name] / [employer] Patient application portion:  Education officer, museum portion: Faxed  to J. Moore, FNP-BC   Follow up:  Will collaborate with Almyra Free to follow up with patient in 7-10 business days to confirm application(s) have been received.  Maud Deed Chana Bode [place] Certified Pharmacy Technician [place] Management Direct [phone]

## 2018-11-18 ENCOUNTER — Other Ambulatory Visit: Payer: Self-pay | Admitting: Nurse Practitioner

## 2018-11-19 ENCOUNTER — Other Ambulatory Visit: Payer: Self-pay | Admitting: Nurse Practitioner

## 2018-11-19 DIAGNOSIS — R69 Illness, unspecified: Secondary | ICD-10-CM | POA: Diagnosis not present

## 2018-11-20 NOTE — Patient Instructions (Signed)
Visit Information  Goals Addressed            This Visit's Progress     Patient Stated   . I would like financial assistance with some of my medications (pt-stated)       Current Barriers:  . Non Adherence to prescribed medication regimen . Financial Barriers  Pharmacist Clinical Goal(s):  [name] Over the next 21 days, patient will work with CCM pharmacist to address needs related to applying for financial assistance for Ozempic.  We will explore cost of Wellbutrin as well.  Interventions: . Advised patient to continue taking medications as prescribed by provider. Patient takes Ozempic on Fridays . Applications for Ozempic patient assistance mailed to patient & sent to provider. [facility] CPhT, Etter Sjogren assisting with PAP process.  . Discussed plans with patient for ongoing care management follow up and provided patient with direct contact information for care management team . Collaboration with provider re: medication management  Patient Self Care Activities:  . Self administers medications as prescribed . Attends all scheduled provider appointments . Calls pharmacy for medication refills  Initial goal documentation     . I would like to improve my medication adherence (pt-stated)       Current Barriers:  . Non Adherence to prescribed medication regimen--dispense report does not match with prescribed regimen.  Will continue to assess compliance  Pharmacist Clinical Goal(s):  [name] Over the next 30 days, patient will demonstrate Improved medication adherence as evidenced by on time refills  Interventions: . Comprehensive medication review performed.  . Will continue to assess compliance.  Medication list updated. . Last A1c was 6.5%. Encouraged patient to continue the great work!  Patient Self Care Activities:  . Self administers medications as prescribed . Attends all scheduled provider appointments . Calls pharmacy for medication refills  Initial goal documentation         The patient verbalized understanding of instructions provided today and declined a print copy of patient instruction materials.   The CM team will reach out to the patient again over the next 14 days.   Regina Eck, PharmD, [place]  [phone]

## 2018-11-20 NOTE — Progress Notes (Signed)
  Chronic Care Management   Initial Visit Note  11/14/2018 Name: Kathy Maxwell MRN: [id number] DOB: 01/08/1956  Referred by: [name], FNP Reason for referral : Chronic Care Management-->medication management/assistance   Kathy Maxwell is a 63 y.o. year old female who is a primary care patient of [name], [place]. The CCM team was consulted for assistance with chronic disease management and care coordination needs.   Review of patient status, including review of consultants reports, relevant laboratory and other test results, and collaboration with appropriate care team members and the patient's provider was performed as part of comprehensive patient evaluation and provision of chronic care management services.    I spoke with Kathy Maxwell by telephone today.  Objective:   Goals Addressed            This Visit's Progress     Patient Stated   . I would like financial assistance with some of my medications (pt-stated)       Current Barriers:  . Non Adherence to prescribed medication regimen . Financial Barriers  Pharmacist Clinical Goal(s):  [name] Over the next 21 days, patient will work with CCM pharmacist to address needs related to applying for financial assistance for Ozempic.  We will explore cost of Wellbutrin as well.  Interventions: . Advised patient to continue taking medications as prescribed by provider. Patient takes Ozempic on Fridays . Applications for Ozempic patient assistance mailed to patient & sent to provider. [employer] CPhT, Etter Sjogren assisting with PAP process.  . Discussed plans with patient for ongoing care management follow up and provided patient with direct contact information for care management team . Collaboration with provider re: medication management  Patient Self Care Activities:  . Self administers medications as prescribed . Attends all scheduled provider appointments . Calls pharmacy for medication refills  Initial goal documentation     . I would like to improve my medication adherence (pt-stated)       Current Barriers:  . Non Adherence to prescribed medication regimen--dispense report does not match with prescribed regimen.  Will continue to assess compliance  Pharmacist Clinical Goal(s):  [name] Over the next 30 days, patient will demonstrate Improved medication adherence as evidenced by on time refills  Interventions: . Comprehensive medication review performed.  . Will continue to assess compliance.  Medication list updated. . Last A1c was 6.5%. Encouraged patient to continue the great work!  Patient Self Care Activities:  . Self administers medications as prescribed . Attends all scheduled provider appointments . Calls pharmacy for medication refills  Initial goal documentation      PLAN: -The CM team will reach out to the patient again over the next 14 days.   -Will continue to follow patient assistance process   Regina Eck, PharmD, BCPS Clinical Pharmacist, [facility] [place]: [phone]

## 2018-11-21 ENCOUNTER — Telehealth: Payer: Self-pay | Admitting: *Deleted

## 2018-11-21 NOTE — Telephone Encounter (Signed)
Medical records faxed to [facility]; [place] [phone]

## 2018-11-22 ENCOUNTER — Ambulatory Visit (INDEPENDENT_AMBULATORY_CARE_PROVIDER_SITE_OTHER): Payer: Medicare HMO | Admitting: Nurse Practitioner

## 2018-11-22 ENCOUNTER — Ambulatory Visit
Admission: RE | Admit: 2018-11-22 | Discharge: 2018-11-22 | Disposition: A | Discharge status: Home / Self Care | Payer: Medicare HMO | Source: Ambulatory Visit | Attending: Oncology | Admitting: Oncology

## 2018-11-22 ENCOUNTER — Encounter: Payer: Self-pay | Admitting: Nurse Practitioner

## 2018-11-22 ENCOUNTER — Other Ambulatory Visit: Payer: Self-pay

## 2018-11-22 VITALS — BP 162/72 | HR 75 | Temp 97.6°F | Ht 62.0 in | Wt 203.2 lb

## 2018-11-22 DIAGNOSIS — Z170 Estrogen receptor positive status [ER+]: Principal | ICD-10-CM

## 2018-11-22 DIAGNOSIS — C50412 Malignant neoplasm of upper-outer quadrant of left female breast: Secondary | ICD-10-CM | POA: Diagnosis not present

## 2018-11-22 DIAGNOSIS — E785 Hyperlipidemia, unspecified: Secondary | ICD-10-CM

## 2018-11-22 DIAGNOSIS — I10 Essential (primary) hypertension: Secondary | ICD-10-CM | POA: Diagnosis not present

## 2018-11-22 DIAGNOSIS — F319 Bipolar disorder, unspecified: Secondary | ICD-10-CM | POA: Diagnosis not present

## 2018-11-22 DIAGNOSIS — E0841 Diabetes mellitus due to underlying condition with diabetic mononeuropathy: Secondary | ICD-10-CM

## 2018-11-22 DIAGNOSIS — R69 Illness, unspecified: Secondary | ICD-10-CM | POA: Diagnosis not present

## 2018-11-22 MED ORDER — BUPROPION HCL ER (SR) 150 MG PO TB12: 150.0000 mg | ORAL_TABLET | Freq: Every day | ORAL | 1 refills | Status: DC

## 2018-11-22 NOTE — Progress Notes (Signed)
Subjective:     Patient ID: Kathy Maxwell , female    DOB: 1955/08/27 , 63 y.o.   MRN: [id number]   Chief Complaint  Patient presents with  . Diabetes  . Hypertension    HPI  Diabetes  She presents for her follow-up diabetic visit. She has type 2 diabetes mellitus. Her disease course has been stable. There are no hypoglycemic associated symptoms. Pertinent negatives for hypoglycemia include no dizziness or headaches. There are no diabetic associated symptoms. Pertinent negatives for diabetes include no blurred vision, no chest pain and no fatigue. There are no hypoglycemic complications. Symptoms are stable. There are no diabetic complications. There are no known risk factors for coronary artery disease. She is compliant with treatment all of the time. (170 blood sugars)  Hypertension  Pertinent negatives include no blurred vision, chest pain, headaches or palpitations.     Past Medical History:  Diagnosis Date  . Allergy    latex  . Arthritis   . Bipolar 1 disorder ([place])   . Bipolar 1 disorder ([place]) 04/04/2013  . Blood transfusion without reported diagnosis    2014 during chemotherapy  . Breast cancer ([place]) 03/10/13   left breast invasive ca  . Cancer ([place])   . Coronary artery disease   . Diabetes mellitus without complication (HCC)    NIDDM  . GERD (gastroesophageal reflux disease)   . Heart murmur   . Hyperlipidemia   . Hyperlipidemia 04/04/2013  . Hypertension   . Hypertension 04/04/2013  . Obesity   . Personal history of chemotherapy   . Personal history of radiation therapy   . Radiation 01/30/14-03/19/14  . Sleep apnea    no cpap due to insurance     Family History  Problem Relation Age of Onset  . Heart disease Mother   . Breast cancer Maternal Aunt        dx in her 36s  . Heart disease Cousin 63       maternal cousin  . Heart attack Sister 44  . Cancer Paternal Aunt        NOS  . Cancer Maternal Aunt        NOS  . Lung cancer Cousin 30       maternal  cousin - non smoker  . Brain cancer Cousin 13       maternal cousin's son  . Lung cancer Cousin 11       maternal cousin's son  . Breast cancer Cousin        paternal cousin  . Colon cancer Neg Hx      Current Outpatient Medications:  .  amLODipine (NORVASC) 10 MG tablet, Take 10 mg by mouth daily., Disp: , Rfl:  .  anastrozole (ARIMIDEX) 1 MG tablet, Take 1 tablet (1 mg total) by mouth daily., Disp: 90 tablet, Rfl: 4 .  buPROPion (WELLBUTRIN SR) 150 MG 12 hr tablet, Take 1 tablet (150 mg total) by mouth daily., Disp: 30 tablet, Rfl: 6 .  cholecalciferol (VITAMIN D3) 25 MCG (1000 UT) tablet, Take 1 tablet (1,000 Units total) by mouth daily., Disp: 100 tablet, Rfl: 4 .  gabapentin (NEURONTIN) 100 MG capsule, TAKE 1 CAPSULE BY MOUTH 3 TIMES A DAY, Disp: 90 capsule, Rfl: 20 .  hydrALAZINE (APRESOLINE) 25 MG tablet, Take 25 mg by mouth 3 (three) times daily. , Disp: , Rfl:  .  lidocaine-prilocaine (EMLA) cream, Apply topically as needed. Apply to port-a-cath one - two hours before accessing., Disp: 30 g,  Rfl: 3 .  losartan (COZAAR) 100 MG tablet, TAKE 1 TABLET BY MOUTH EVERY DAY, Disp: 90 tablet, Rfl: 1 .  metoprolol succinate (TOPROL-XL) 25 MG 24 hr tablet, TAKE 1 TABLET BY MOUTH EVERY DAY, Disp: 90 tablet, Rfl: 1 .  naproxen (NAPROSYN) 500 MG tablet, TAKE 1 TABLET BY MOUTH TWICE A DAY WITH A MEAL, Disp: 60 tablet, Rfl: 0 .  ONETOUCH VERIO test strip, CHECK BLOOD SUGAR TWICE A DAY BEFORE BREAKFAST AND DINNER, Disp: 100 each, Rfl: 7 .  pantoprazole (PROTONIX) 40 MG tablet, Take 1 tablet (40 mg total) by mouth daily., Disp: 30 tablet, Rfl: 3 .  pioglitazone-metformin (ACTOPLUS MET) 15-850 MG tablet, TAKE 1 TABLET BY MOUTH EVERY DAY, Disp: 90 tablet, Rfl: 1 .  rosuvastatin (CRESTOR) 20 MG tablet, Take 1 tablet (20 mg total) by mouth daily., Disp: 30 tablet, Rfl: 12 .  Semaglutide,0.25 or 0.'5MG'$ /DOS, (OZEMPIC, 0.25 OR 0.5 MG/DOSE,) 2 MG/1.5ML SOPN, Inject 0.5 mg into the skin once a week., Disp: 1  pen, Rfl: 4 .  buPROPion (ZYBAN) 150 MG 12 hr tablet, TAKE 1 TABLET BY MOUTH EVERY DAY (Patient not taking: Reported on 11/22/2018), Disp: 90 tablet, Rfl: 1 No current facility-administered medications for this visit.   Facility-Administered Medications Ordered in Other Visits:  .  sodium chloride 0.9 % injection 10 mL, 10 mL, Intravenous, PRN, Marcy Panning, MD, 10 mL at 05/24/13 1522   Allergies  Allergen Reactions  . Cephalexin Other (See Comments)    Burned throat.  . Adhesive [Tape] Rash    Clear tape gloves ,elastic no problem  . Latex Hives and Rash     Review of Systems  Constitutional: Negative for fatigue.  Eyes: Negative for blurred vision.  Respiratory: Negative.  Negative for cough.   Cardiovascular: Negative.  Negative for chest pain, palpitations and leg swelling.  Neurological: Negative for dizziness and headaches.     Today's Vitals   11/22/18 1146  BP: (!) 162/72  Pulse: 75  Temp: 97.6 F (36.4 C)  TempSrc: Oral  Weight: 203 lb 3.2 oz (92.2 kg)  Height: '5\' 2"'$  (1.575 m)  PainSc: 0-No pain   Body mass index is 37.17 kg/m.   Objective:  Physical Exam Constitutional:      Appearance: Normal appearance.  Cardiovascular:     Rate and Rhythm: Normal rate and regular rhythm.     Pulses: Normal pulses.     Heart sounds: Normal heart sounds.  Pulmonary:     Effort: Pulmonary effort is normal. No respiratory distress.     Breath sounds: Normal breath sounds.  Chest:       Comments: Port Skin:    General: Skin is warm and dry.     Capillary Refill: Capillary refill takes less than 2 seconds.  Neurological:     General: No focal deficit present.     Mental Status: She is alert and oriented to person, place, and time.  Psychiatric:        Mood and Affect: Mood normal.        Behavior: Behavior normal.        Thought Content: Thought content normal.        Judgment: Judgment normal.         Assessment And Plan:     1. Essential hypertension   Chronic, poorly controlled today  She is encouraged to make sure she is taking her medication every day  2. Hyperlipidemia, unspecified hyperlipidemia type  Chronic, controlled  Continue with current  medications  3. 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   Continue with current medications  Encouraged to limit intake of sugary foods and drinks  Encouraged to increase physical activity to 150 minutes per week as tolerated  4. Bipolar 1 disorder (HCC)  Stable  Continue current medications - buPROPion (WELLBUTRIN SR) 150 MG 12 hr tablet; Take 1 tablet (150 mg total) by mouth daily.  Dispense: 90 tablet; Refill: 1  5. Breast cancer of upper-outer quadrant of left female breast ([place])  Continue follow up with oncology  Labs to be drawn when she has  Her port flushed - buPROPion (WELLBUTRIN SR) 150 MG 12 hr tablet; Take 1 tablet (150 mg total) by mouth daily.  Dispense: 90 tablet; Refill: 1       [name], FNP    THE PATIENT IS ENCOURAGED TO PRACTICE SOCIAL DISTANCING DUE TO THE COVID-19 PANDEMIC.

## 2018-11-23 ENCOUNTER — Ambulatory Visit: Payer: Medicare HMO | Admitting: Nurse Practitioner

## 2018-11-30 ENCOUNTER — Other Ambulatory Visit: Payer: Self-pay | Admitting: Internal Medicine

## 2018-11-30 DIAGNOSIS — M25551 Pain in right hip: Secondary | ICD-10-CM

## 2018-12-11 ENCOUNTER — Other Ambulatory Visit: Payer: Self-pay | Admitting: Pharmacy Technician

## 2018-12-11 NOTE — Patient Outreach (Signed)
[facility]) Care Management  12/11/2018  SHENEQUA HOWSE 03-21-1956 [id number]    Unsuccessful call #1 placed to patient regarding patient assistance application(s) for Ozempic , HIPAA compliant voicemail left.   Follow up:  Will make 2nd call attempt in 2-3 business days if call has not been returned.  Maud Deed Chana Bode [place] Certified Pharmacy Technician [place] Management Direct [phone]

## 2018-12-12 ENCOUNTER — Telehealth: Payer: Self-pay | Admitting: Oncology

## 2018-12-12 NOTE — Telephone Encounter (Signed)
Returned call to patient re scheduling appointment. Patient requesting port. Last port 3/23. Confirmed 5/29 port with patient and scheduled q6w thru to next f/u in April. Patient will get schedule 5/29.

## 2018-12-14 ENCOUNTER — Other Ambulatory Visit: Payer: Self-pay

## 2018-12-14 ENCOUNTER — Inpatient Hospital Stay: Payer: Medicare HMO | Attending: Oncology

## 2018-12-14 DIAGNOSIS — Z452 Encounter for adjustment and management of vascular access device: Secondary | ICD-10-CM | POA: Insufficient documentation

## 2018-12-14 DIAGNOSIS — C50412 Malignant neoplasm of upper-outer quadrant of left female breast: Secondary | ICD-10-CM | POA: Diagnosis present

## 2018-12-14 DIAGNOSIS — C773 Secondary and unspecified malignant neoplasm of axilla and upper limb lymph nodes: Secondary | ICD-10-CM | POA: Diagnosis not present

## 2018-12-14 DIAGNOSIS — D0511 Intraductal carcinoma in situ of right breast: Secondary | ICD-10-CM | POA: Diagnosis not present

## 2018-12-14 DIAGNOSIS — Z95828 Presence of other vascular implants and grafts: Secondary | ICD-10-CM

## 2018-12-14 MED ORDER — SODIUM CHLORIDE 0.9% FLUSH
10.0000 mL | Freq: Once | INTRAVENOUS | Status: AC
  Administered 2018-12-14: 10 mL
  Filled 2018-12-14: qty 10

## 2018-12-14 MED ORDER — HEPARIN SOD (PORK) LOCK FLUSH 100 UNIT/ML IV SOLN
500.0000 [IU] | Freq: Once | INTRAVENOUS | Status: AC
  Administered 2018-12-14: 500 [IU]
  Filled 2018-12-14: qty 5

## 2018-12-18 ENCOUNTER — Other Ambulatory Visit: Payer: Self-pay | Admitting: Pharmacy Technician

## 2018-12-18 NOTE — Patient Outreach (Signed)
[facility]) Care Management  12/18/2018  Kathy Maxwell 27-Nov-1955 [id number]    Successful call placed to patient regarding patient assistance application(s) for Ozempic , HIPAA identifiers verified. [name] states that she received patient assistance application and faxed application in. She is not able to confirm if she faxed to myself or [employer]. Provided patient with my fax number and she states she will fax items into me.  Follow up:  Will submit to [employer] once documents have been received.  Maud Deed Chana Bode [place] Certified Pharmacy Technician [place] Management Direct Dial:[phone]

## 2018-12-19 ENCOUNTER — Ambulatory Visit: Payer: Self-pay | Admitting: Pharmacist

## 2018-12-19 ENCOUNTER — Other Ambulatory Visit: Payer: Self-pay | Admitting: Pharmacy Technician

## 2018-12-19 NOTE — Patient Outreach (Signed)
[facility]) Care Management  12/19/2018  ANGELI DEMILIO Jul 21, 1955 [id number]   Received patient portion(s) of patient assistance application for Ozempic. Faxed completed application and required documents into [employer].  Will follow up with company in 3-5 business days to check status of application.  Maud Deed Chana Bode [place] Certified Pharmacy Technician [place] Management Direct [phone]

## 2018-12-19 NOTE — Progress Notes (Addendum)
  Chronic Care Management   Outreach Note  12/19/2018 Name: Kathy Maxwell MRN: [id number] DOB: [birth year]  Referred by: [name], FNP Reason for referral : Chronic Care Management   An unsuccessful telephone outreach was attempted today. The patient was referred to the case management team by for assistance with chronic care management and care coordination.   Patient assistance application for Ozempic has been mailed to company for approval.  Will continue to follow.   Follow Up Plan: The care management team will reach out to the patient again in the next 2 weeks.    Regina Eck, PharmD, BCPS Clinical Pharmacist, [facility] [place]: [phone]

## 2018-12-21 ENCOUNTER — Telehealth: Payer: Self-pay

## 2018-12-24 ENCOUNTER — Other Ambulatory Visit: Payer: Self-pay | Admitting: Pharmacy Technician

## 2018-12-24 NOTE — Patient Outreach (Signed)
[facility]) Care Management  12/24/2018  SHERRICA NIEHAUS 1956-02-05 [id number]    Follow up call placed to [place] regarding patient assistance application(s) for Ozempic , Ronny Bacon confirms patient has been approved as of 6/8 until 06/17/19. Medication to arrive at providers office in 10-14 business days.  Follow up:  Will route note to [place] to inform.  Maud Deed Chana Bode [place] Certified Pharmacy Technician [place] Management Direct [phone]

## 2018-12-27 ENCOUNTER — Telehealth: Payer: Self-pay

## 2018-12-28 DIAGNOSIS — E119 Type 2 diabetes mellitus without complications: Secondary | ICD-10-CM | POA: Diagnosis not present

## 2019-01-05 ENCOUNTER — Other Ambulatory Visit: Payer: Self-pay | Admitting: Internal Medicine

## 2019-01-05 DIAGNOSIS — M25551 Pain in right hip: Secondary | ICD-10-CM

## 2019-01-08 ENCOUNTER — Other Ambulatory Visit: Payer: Self-pay

## 2019-01-08 ENCOUNTER — Telehealth: Payer: Self-pay

## 2019-01-08 DIAGNOSIS — M25551 Pain in right hip: Secondary | ICD-10-CM

## 2019-01-08 MED ORDER — NAPROXEN 500 MG PO TABS: 500.0000 mg | ORAL_TABLET | Freq: Two times a day (BID) | ORAL | 0 refills | Status: DC | PRN

## 2019-01-08 NOTE — Telephone Encounter (Signed)
Patient called wanting to know when her next appointment is and also requested a refill on naproxen.  RETURNED PT CALL NOTIFIED HER OF HER APPOINTMENT AND OF THE RX BEING SENT. Kathy Maxwell

## 2019-01-09 DIAGNOSIS — R69 Illness, unspecified: Secondary | ICD-10-CM | POA: Diagnosis not present

## 2019-01-14 ENCOUNTER — Ambulatory Visit (INDEPENDENT_AMBULATORY_CARE_PROVIDER_SITE_OTHER): Payer: Medicare HMO | Admitting: Pharmacist

## 2019-01-14 DIAGNOSIS — I10 Essential (primary) hypertension: Secondary | ICD-10-CM

## 2019-01-14 DIAGNOSIS — E1140 Type 2 diabetes mellitus with diabetic neuropathy, unspecified: Secondary | ICD-10-CM | POA: Diagnosis not present

## 2019-01-14 DIAGNOSIS — E785 Hyperlipidemia, unspecified: Secondary | ICD-10-CM

## 2019-01-15 ENCOUNTER — Ambulatory Visit: Payer: Self-pay

## 2019-01-15 DIAGNOSIS — I10 Essential (primary) hypertension: Secondary | ICD-10-CM

## 2019-01-15 DIAGNOSIS — F319 Bipolar disorder, unspecified: Secondary | ICD-10-CM

## 2019-01-15 DIAGNOSIS — E0841 Diabetes mellitus due to underlying condition with diabetic mononeuropathy: Secondary | ICD-10-CM

## 2019-01-15 DIAGNOSIS — E785 Hyperlipidemia, unspecified: Secondary | ICD-10-CM

## 2019-01-15 DIAGNOSIS — E1140 Type 2 diabetes mellitus with diabetic neuropathy, unspecified: Secondary | ICD-10-CM

## 2019-01-15 NOTE — Progress Notes (Signed)
  Chronic Care Management Visit Note  01/14/2019 Name: Kathy Maxwell MRN: [id number] DOB: Mar 25, 1956  Referred by: [name], FNP Reason for referral : Chronic Care Management   Kathy Maxwell is a 63 y.o. year old female who is a primary care patient of [name], [place]. The CCM team was consulted for assistance with chronic disease management and care coordination needs.   Review of patient status, including review of consultants reports, relevant laboratory and other test results, and collaboration with appropriate care team members and the patient's provider was performed as part of comprehensive patient evaluation and provision of chronic care management services.    Objective:   Goals Addressed            This Visit's Progress     Patient Stated   . I would like financial assistance with some of my medications (pt-stated)       Current Barriers:  . Non Adherence to prescribed medication regimen . Financial Barriers  Pharmacist Clinical Goal(s):  [name] Over the next 21 days, patient will work with CCM pharmacist to address needs related to applying for financial assistance for Ozempic.  We will explore cost of Wellbutrin as well.  Interventions: . Advised patient to continue taking medications as prescribed by provider. Patient takes Ozempic on Fridays . Applications for Ozempic patient assistance mailed to patient & sent to provider. [employer] CPhT, Etter Sjogren assisting with PAP process.  . Patient has ben approved for Ozempic until 07/18/2019.  Fax sent to [employer] to assist with re-shipment as original box did not contain shipping label.  Will follow up in 7-10 business days. . Discussed plans with patient for ongoing care management follow up and provided patient with direct contact information for care management team . Collaboration with provider re: medication management  Patient Self Care Activities:  . Self administers medications as prescribed . Attends all  scheduled provider appointments . Calls pharmacy for medication refills  Please see past updates related to this goal by clicking on the "Past Updates" button in the selected goal          Plan:   The care management team will reach out to the patient again over the next 7 days.   Regina Eck, PharmD, BCPS Clinical Pharmacist, [facility] [place]: [phone]

## 2019-01-15 NOTE — Patient Instructions (Signed)
Visit Information  Goals Addressed            This Visit's Progress     Patient Stated   . I would like financial assistance with some of my medications (pt-stated)       Current Barriers:  . Non Adherence to prescribed medication regimen . Financial Barriers  Pharmacist Clinical Goal(s):  [name] Over the next 21 days, patient will work with CCM pharmacist to address needs related to applying for financial assistance for Ozempic.  We will explore cost of Wellbutrin as well.  Interventions: . Advised patient to continue taking medications as prescribed by provider. Patient takes Ozempic on Fridays . Applications for Ozempic patient assistance mailed to patient & sent to provider. [employer] CPhT, Etter Sjogren assisting with PAP process.  . Patient has ben approved for Ozempic until 07/18/2019.  Fax sent to [employer] to assist with re-shipment as original box did not contain shipping label.  Will follow up in 7-10 business days. . Discussed plans with patient for ongoing care management follow up and provided patient with direct contact information for care management team . Collaboration with provider re: medication management  Patient Self Care Activities:  . Self administers medications as prescribed . Attends all scheduled provider appointments . Calls pharmacy for medication refills  Please see past updates related to this goal by clicking on the "Past Updates" button in the selected goal         The patient verbalized understanding of instructions provided today and declined a print copy of patient instruction materials.   The care management team will reach out to the patient again over the next 7 days.   Regina Eck, PharmD, BCPS Clinical Pharmacist, [facility] [place]: [phone]

## 2019-01-16 NOTE — Chronic Care Management (AMB) (Signed)
  Chronic Care Management   Outreach Note  01/16/2019 Name: GLENDER AUGUSTA MRN: [id number] DOB: Aug 03, 1955  Referred by: [name], FNP Reason for referral : Chronic Care Management (CCM RNCM Telephone Follow up)   A second unsuccessful telephone outreach was attempted today. The patient was referred to the case management team for assistance with chronic care management and care coordination.   Follow Up Plan: The care management team will reach out to the patient again over the next 7-14 days.    Barb Merino, RN, BSN, CCM Care Management Coordinator [place] Management/[facility]  Direct Phone: [phone]

## 2019-01-21 ENCOUNTER — Ambulatory Visit: Payer: Self-pay

## 2019-01-21 NOTE — Progress Notes (Signed)
  Chronic Care Management   Outreach Note  01/21/2019 Name: Kathy Maxwell MRN: [id number] DOB: Nov 18, 1955  Referred by: [name], FNP Reason for referral : Chronic Care Management   An unsuccessful telephone outreach was attempted today. The patient was referred to the case management team by for assistance with chronic care management and care coordination.   Follow Up Plan: The care management team will reach out to the patient again over the next 5-7 business days.   Regina Eck, PharmD, BCPS Clinical Pharmacist, [facility] [place]: [phone]

## 2019-01-24 ENCOUNTER — Ambulatory Visit: Payer: Self-pay | Admitting: Pharmacist

## 2019-01-24 DIAGNOSIS — I10 Essential (primary) hypertension: Secondary | ICD-10-CM

## 2019-01-24 DIAGNOSIS — E1140 Type 2 diabetes mellitus with diabetic neuropathy, unspecified: Secondary | ICD-10-CM

## 2019-01-24 DIAGNOSIS — E785 Hyperlipidemia, unspecified: Secondary | ICD-10-CM

## 2019-01-25 ENCOUNTER — Inpatient Hospital Stay: Payer: Medicare HMO | Attending: Oncology

## 2019-01-25 ENCOUNTER — Other Ambulatory Visit: Payer: Self-pay

## 2019-01-25 DIAGNOSIS — D0511 Intraductal carcinoma in situ of right breast: Secondary | ICD-10-CM | POA: Diagnosis not present

## 2019-01-25 DIAGNOSIS — C773 Secondary and unspecified malignant neoplasm of axilla and upper limb lymph nodes: Secondary | ICD-10-CM | POA: Diagnosis not present

## 2019-01-25 DIAGNOSIS — C50412 Malignant neoplasm of upper-outer quadrant of left female breast: Secondary | ICD-10-CM | POA: Diagnosis present

## 2019-01-25 DIAGNOSIS — Z452 Encounter for adjustment and management of vascular access device: Secondary | ICD-10-CM | POA: Insufficient documentation

## 2019-01-25 DIAGNOSIS — Z170 Estrogen receptor positive status [ER+]: Secondary | ICD-10-CM

## 2019-01-25 DIAGNOSIS — Z95828 Presence of other vascular implants and grafts: Secondary | ICD-10-CM

## 2019-01-25 MED ORDER — HEPARIN SOD (PORK) LOCK FLUSH 100 UNIT/ML IV SOLN
250.0000 [IU] | Freq: Once | INTRAVENOUS | Status: AC
  Administered 2019-01-25: 500 [IU]
  Filled 2019-01-25: qty 5

## 2019-01-25 MED ORDER — SODIUM CHLORIDE 0.9% FLUSH
10.0000 mL | Freq: Once | INTRAVENOUS | Status: AC
  Administered 2019-01-25: 10 mL
  Filled 2019-01-25: qty 10

## 2019-01-25 NOTE — Patient Instructions (Signed)

## 2019-01-28 ENCOUNTER — Telehealth: Payer: Self-pay

## 2019-01-30 NOTE — Progress Notes (Signed)
Chronic Care Management   Visit Note  01/24/2019 Name: Kathy Maxwell MRN: [id number] DOB: 1956/03/25  Referred by: [name], FNP Reason for referral : Chronic care managment  Kathy Maxwell is a 63 y.o. year old female who is a primary care patient of [name], [place]. The CCM team was consulted for assistance with chronic disease management and care coordination needs.   Review of patient status, including review of consultants reports, relevant laboratory and other test results, and collaboration with appropriate care team members and the patient's provider was performed as part of comprehensive patient evaluation and provision of chronic care management services.    Objective:   Goals Addressed            This Visit's Progress     Patient Stated   . I would like financial assistance with some of my medications (pt-stated)       Current Barriers:  . Non Adherence to prescribed medication regimen . Financial Barriers  Pharmacist Clinical Goal(s):  [name] Over the next 30 days, patient will work with CCM pharmacist to address needs related to applying for financial assistance for Ozempic.  We will explore cost of Wellbutrin as well.  Interventions: . Advised patient to continue taking medications as prescribed by provider. Patient takes Ozempic on Fridays . Applications for Ozempic patient assistance mailed to patient & sent to provider. [facility] CPhT, Etter Sjogren assisting with PAP process.  . Patient has ben approved for Ozempic until 07/18/2019.  Fax sent to [employer] to assist with re-shipment as original box did not contain shipping label.  Reshipment schedule to come in 7-10 business days. . Discussed plans with patient for ongoing care management follow up and provided patient with direct contact information for care management team . Collaboration with provider re: medication management  Patient Self Care Activities:  . Self administers medications as prescribed .  Attends all scheduled provider appointments . Calls pharmacy for medication refills  Please see past updates related to this goal by clicking on the "Past Updates" button in the selected goal      . I would like to improve my medication adherence (pt-stated)       Current Barriers:  . Non Adherence to prescribed medication regimen--dispense report does not match with prescribed regimen.  Will continue to assess compliance.  Pharmacist Clinical Goal(s):  [name] Over the next 90 days, patient will demonstrate Improved medication adherence as evidenced by on time refills . Over the next 90 days, patient will work with PharmD and PCP to optimize medication management of chronic conditions.  Interventions: . Comprehensive medication review performed.  Reviewed medication fill history via insurance claims data confirming patient appears compliant with having his medications filled on time as prescribed by provider. . Reviewed & discussed the following diabetes-related information with patient: o Continue checking blood sugars as directed o Follow ADA recommended "diabetes-friendly" diet  (reviewed healthy snack/food options) o Confirmed current DM regimen: Ozempic 0.5mg  weekly, Actos Plus.  Will continue to optimize DM regimen.  Patient wishes to continue on current regimen.  We are hoping to optimize diet & exercise and potentially taper off of actos-->continue Ozempic and Metformin. o Discussed GLP-1 injection technique; Patient uses AccuCheck glucometer o Reviewed medication purpose/side effects-->patient denies adverse events, denies hypoglycemia, reports FBG<130, most recently in the upper 90s. o Most recent A1c is 6.5% o Continue taking all medications as prescribed by provider . Patient continues on statin, ASA, ARB as appropriate.  Statin last filled  for 90-day supply on 12/04/18.  Will follow up to address refills.  Patient Self Care Activities:  . Self administers medications as prescribed  . Attends all scheduled provider appointments . Calls pharmacy for medication refills  Please see past updates related to this goal by clicking on the "Past Updates" button in the selected goal          Plan:   The care management team will reach out to the patient again over the next 14 days.   Regina Eck, PharmD, BCPS Clinical Pharmacist, [facility] [place]: [phone]

## 2019-01-30 NOTE — Patient Instructions (Signed)
Visit Information  Goals Addressed            This Visit's Progress     Patient Stated   . I would like to improve my medication adherence (pt-stated)       Current Barriers:  . Non Adherence to prescribed medication regimen--dispense report does not match with prescribed regimen.  Will continue to assess compliance.  Pharmacist Clinical Goal(s):  [name] Over the next 90 days, patient will demonstrate Improved medication adherence as evidenced by on time refills . Over the next 90 days, patient will work with PharmD and PCP to optimize medication management of chronic conditions.  Interventions: . Comprehensive medication review performed.  Reviewed medication fill history via insurance claims data confirming patient appears compliant with having his medications filled on time as prescribed by provider. . Reviewed & discussed the following diabetes-related information with patient: o Continue checking blood sugars as directed o Follow ADA recommended "diabetes-friendly" diet  (reviewed healthy snack/food options) o Confirmed current DM regimen: Ozempic 0.5mg  weekly, Actos Plus.  Will continue to optimize DM regimen.  Patient wishes to continue on current regimen.  We are hoping to optimize diet & exercise and potentially taper off of actos-->continue Ozempic and Metformin. o Discussed GLP-1 injection technique; Patient uses AccuCheck glucometer o Reviewed medication purpose/side effects-->patient denies adverse events, denies hypoglycemia, reports FBG<130, most recently in the upper 90s. o Most recent A1c is 6.5% o Continue taking all medications as prescribed by provider . Patient continues on statin, ASA, ARB as appropriate.  Statin last filled for 90-day supply on 12/04/18.  Will follow up to address refills.  Patient Self Care Activities:  . Self administers medications as prescribed . Attends all scheduled provider appointments . Calls pharmacy for medication refills  Please see  past updates related to this goal by clicking on the "Past Updates" button in the selected goal         The patient verbalized understanding of instructions provided today and declined a print copy of patient instruction materials.   The care management team will reach out to the patient again over the next 14 days.   Regina Eck, PharmD, BCPS Clinical Pharmacist, [facility] [place]: [phone]

## 2019-02-06 ENCOUNTER — Ambulatory Visit: Payer: Self-pay | Admitting: Pharmacist

## 2019-02-06 DIAGNOSIS — I10 Essential (primary) hypertension: Secondary | ICD-10-CM

## 2019-02-06 DIAGNOSIS — E1140 Type 2 diabetes mellitus with diabetic neuropathy, unspecified: Secondary | ICD-10-CM

## 2019-02-06 DIAGNOSIS — E785 Hyperlipidemia, unspecified: Secondary | ICD-10-CM

## 2019-02-06 NOTE — Progress Notes (Signed)
  Chronic Care Management   Outreach Note  02/06/2019 Name: Kathy Maxwell MRN: [id number] DOB: 02/08/1956  Referred by: [name], FNP Reason for referral : Chronic Care Management   An unsuccessful telephone outreach was attempted today. The patient was referred to the case management team by for assistance with chronic care management and care coordination. Patient's Ozempic has arrived via [employer].  Patient has been notified.  Will follow up.  Follow Up Plan: The care management team will reach out to the patient again over the next 3-5 business days.   Regina Eck, PharmD, BCPS Clinical Pharmacist, [facility] [place]: [phone]

## 2019-02-07 ENCOUNTER — Other Ambulatory Visit: Payer: Self-pay | Admitting: Nurse Practitioner

## 2019-02-07 DIAGNOSIS — M25551 Pain in right hip: Secondary | ICD-10-CM

## 2019-02-11 ENCOUNTER — Ambulatory Visit: Payer: Self-pay | Admitting: Pharmacist

## 2019-02-11 NOTE — Progress Notes (Signed)
  Chronic Care Management   Outreach Note  02/11/2019 Name: Kathy Maxwell MRN: [id number] DOB: [birth year]  Referred by: [name], FNP Reason for referral : Chronic Care Management   A second unsuccessful telephone outreach was attempted today. The patient was referred to the case management team for assistance with chronic care management and care coordination.   Follow Up Plan: The care management team will reach out to the patient again over the next 7-10 business  days.   Regina Eck, PharmD, BCPS Clinical Pharmacist, [facility] [place]: [phone]

## 2019-02-15 ENCOUNTER — Other Ambulatory Visit: Payer: Self-pay | Admitting: Nurse Practitioner

## 2019-02-19 ENCOUNTER — Telehealth: Payer: Self-pay

## 2019-02-21 ENCOUNTER — Telehealth: Payer: Self-pay

## 2019-02-26 ENCOUNTER — Telehealth: Payer: Self-pay

## 2019-02-27 ENCOUNTER — Other Ambulatory Visit: Payer: Self-pay

## 2019-02-27 ENCOUNTER — Ambulatory Visit: Payer: Medicare HMO | Admitting: Nurse Practitioner

## 2019-02-27 ENCOUNTER — Ambulatory Visit: Payer: Self-pay | Admitting: Pharmacist

## 2019-02-27 ENCOUNTER — Telehealth: Payer: Self-pay | Admitting: Oncology

## 2019-02-27 ENCOUNTER — Telehealth: Payer: Self-pay

## 2019-02-27 DIAGNOSIS — I10 Essential (primary) hypertension: Secondary | ICD-10-CM

## 2019-02-27 DIAGNOSIS — E785 Hyperlipidemia, unspecified: Secondary | ICD-10-CM

## 2019-02-27 DIAGNOSIS — C50412 Malignant neoplasm of upper-outer quadrant of left female breast: Secondary | ICD-10-CM

## 2019-02-27 DIAGNOSIS — F319 Bipolar disorder, unspecified: Secondary | ICD-10-CM

## 2019-02-27 DIAGNOSIS — E1140 Type 2 diabetes mellitus with diabetic neuropathy, unspecified: Secondary | ICD-10-CM

## 2019-02-27 MED ORDER — BUPROPION HCL ER (SR) 150 MG PO TB12: 150.0000 mg | ORAL_TABLET | Freq: Every day | ORAL | 1 refills | Status: DC

## 2019-02-27 NOTE — Telephone Encounter (Signed)
Called to inform pt that thn pharm was trying to reach her

## 2019-02-27 NOTE — Telephone Encounter (Signed)
Called pt per 8/11 sch message - nop answer - left message for patient to call

## 2019-02-28 ENCOUNTER — Ambulatory Visit: Payer: Medicare HMO | Admitting: Nurse Practitioner

## 2019-03-01 ENCOUNTER — Telehealth: Payer: Self-pay | Admitting: *Deleted

## 2019-03-01 NOTE — Telephone Encounter (Signed)
Pt called asking to move Port flush appt to 8/20 b/c she has an appt with her PCP that afternoon.  She needs labs for her PCP.  Instructed to have her PCP send order to lab with diagnosis code & where to send results & this nurse will send schedule message to move flush appt & add lab.

## 2019-03-04 ENCOUNTER — Telehealth: Payer: Self-pay | Admitting: Hematology

## 2019-03-04 ENCOUNTER — Other Ambulatory Visit: Payer: Self-pay | Admitting: Nurse Practitioner

## 2019-03-04 ENCOUNTER — Telehealth: Payer: Self-pay

## 2019-03-04 DIAGNOSIS — M25551 Pain in right hip: Secondary | ICD-10-CM

## 2019-03-04 NOTE — Progress Notes (Signed)
Chronic Care Management   Visit Note  02/27/2019 Name: Kathy Maxwell MRN: [id number] DOB: 07/14/1956  Referred by: [name], FNP Reason for referral : Chronic Care Management   Kathy Maxwell is a 63 y.o. year old female who is a primary care patient of [name], [place]. The CCM team was consulted for assistance with chronic disease management and care coordination needs.   Review of patient status, including review of consultants reports, relevant laboratory and other test results, and collaboration with appropriate care team members and the patient's provider was performed as part of comprehensive patient evaluation and provision of chronic care management services.    I spoke with [name] by telephone today.  Medications: Outpatient Encounter Medications as of 02/27/2019  Medication Sig Note  . amLODipine (NORVASC) 10 MG tablet TAKE 1 TABLET BY MOUTH EVERY DAY   . anastrozole (ARIMIDEX) 1 MG tablet Take 1 tablet (1 mg total) by mouth daily.   . cholecalciferol (VITAMIN D3) 25 MCG (1000 UT) tablet Take 1 tablet (1,000 Units total) by mouth daily.   [name] gabapentin (NEURONTIN) 100 MG capsule TAKE 1 CAPSULE BY MOUTH 3 TIMES A DAY   . hydrALAZINE (APRESOLINE) 25 MG tablet Take 25 mg by mouth 3 (three) times daily.    [name] lidocaine-prilocaine (EMLA) cream Apply topically as needed. Apply to port-a-cath one - two hours before accessing.   [name] losartan (COZAAR) 100 MG tablet TAKE 1 TABLET BY MOUTH EVERY DAY   . metoprolol succinate (TOPROL-XL) 25 MG 24 hr tablet TAKE 1 TABLET BY MOUTH EVERY DAY   . ONETOUCH VERIO test strip CHECK BLOOD SUGAR TWICE A DAY BEFORE BREAKFAST AND DINNER   . pantoprazole (PROTONIX) 40 MG tablet Take 1 tablet (40 mg total) by mouth daily.   . pioglitazone-metformin (ACTOPLUS MET) 15-850 MG tablet TAKE 1 TABLET BY MOUTH EVERY DAY   . rosuvastatin (CRESTOR) 20 MG tablet Take 1 tablet (20 mg total) by mouth daily.   . Semaglutide,0.25 or 0.5MG/DOS, (OZEMPIC, 0.25  OR 0.5 MG/DOSE,) 2 MG/1.5ML SOPN Inject 0.5 mg into the skin once a week. 11/14/2018: Injects on Fridays  . [DISCONTINUED] buPROPion (WELLBUTRIN SR) 150 MG 12 hr tablet Take 1 tablet (150 mg total) by mouth daily.   . [DISCONTINUED] naproxen (NAPROSYN) 500 MG tablet TAKE 1 TABLET BY MOUTH TWICE A DAY AS NEEDED    Facility-Administered Encounter Medications as of 02/27/2019  Medication  . sodium chloride 0.9 % injection 10 mL     Objective:   Goals Addressed            This Visit's Progress     Patient Stated   . COMPLETED: I would like financial assistance with some of my medications (pt-stated)       Current Barriers:  . Non Adherence to prescribed medication regimen . Financial Barriers  Pharmacist Clinical Goal(s):  [name] Over the next 30 days, patient will work with CCM pharmacist to address needs related to applying for financial assistance for Ozempic.  We will explore cost of Wellbutrin as well.  Goal complete  Interventions: . Advised patient to continue taking medications as prescribed by provider. Patient takes Ozempic on Fridays . Applications for Ozempic patient assistance mailed to patient & sent to provider. [facility] CPhT, Etter Sjogren assisting with PAP process.  . Patient has ben approved for Ozempic until 07/18/2019.  Medication has been delivered to PCP's office.  Patient verbalizes understanding. . Discussed plans with patient for ongoing care management follow up  and provided patient with direct contact information for care management team . Collaboration with provider re: medication management  Patient Self Care Activities:  . Self administers medications as prescribed . Attends all scheduled provider appointments . Calls pharmacy for medication refills  Please see past updates related to this goal by clicking on the "Past Updates" button in the selected goal      . I would like to improve my medication adherence (pt-stated)       Current Barriers:  . Non  Adherence to prescribed medication regimen--dispense report does not match with prescribed regimen.  Will continue to assess compliance.  Pharmacist Clinical Goal(s):  [name] Over the next 90 days, patient will demonstrate Improved medication adherence as evidenced by on time refills . Over the next 90 days, patient will work with PharmD and PCP to optimize medication management of chronic conditions.  Interventions: . Comprehensive medication review performed.   . Reviewed & discussed the following diabetes-related information with patient: o Continue checking blood sugars as directed o Follow ADA recommended "diabetes-friendly" diet  (reviewed healthy snack/food options) o Confirmed current DM regimen: Ozempic 0.67m weekly, Actos Plus.  Will continue to optimize DM regimen.  Patient wishes to continue on current regimen.  We are hoping to optimize diet & exercise and potentially taper off of actos-->continue Ozempic and Metformin. o Discussed GLP-1 injection technique; Patient uses AccuCheck glucometer o Reviewed medication purpose/side effects-->patient denies adverse events, denies hypoglycemia, reports FBG<130, most recently in the upper 90s. o Most recent A1c is 6.5% o Continue taking all medications as prescribed by provider . Patient continues on statin, ASA, ARB as appropriate.  Statin last filled for 90-day supply(patient verbalizes 100% compliance/denies side effects.  Will follow up to address refills.  Patient Self Care Activities:  . Self administers medications as prescribed . Attends all scheduled provider appointments . Calls pharmacy for medication refills  Please see past updates related to this goal by clicking on the "Past Updates" button in the selected goal         Plan:   The care management team will reach out to the patient again over the next 3- 4 weeks.  JRegina Eck PharmD, BCPS Clinical Pharmacist, [facility] [place] [phone]

## 2019-03-04 NOTE — Telephone Encounter (Signed)
Patient called stating she is going to see Dr..Magrinat and wants to know if we need any blood work.  I HAVE FAXED OVER THE BLOODWORK TO DR.MAGRINAT'S OFFICE AT [phone] AND PT IS AWARE. Lonia Mad

## 2019-03-04 NOTE — Telephone Encounter (Signed)
R/s appt per 8/14 sch message- pt aware of new appt date and time

## 2019-03-04 NOTE — Patient Instructions (Signed)
Visit Information  Goals Addressed            This Visit's Progress     Patient Stated   . COMPLETED: I would like financial assistance with some of my medications (pt-stated)       Current Barriers:  . Non Adherence to prescribed medication regimen . Financial Barriers  Pharmacist Clinical Goal(s):  [name] Over the next 30 days, patient will work with CCM pharmacist to address needs related to applying for financial assistance for Ozempic.  We will explore cost of Wellbutrin as well.  Goal complete  Interventions: . Advised patient to continue taking medications as prescribed by provider. Patient takes Ozempic on Fridays . Applications for Ozempic patient assistance mailed to patient & sent to provider. [facility] CPhT, Etter Sjogren assisting with PAP process.  . Patient has ben approved for Ozempic until 07/18/2019.  Medication has been delivered to PCP's office.  Patient verbalizes understanding. . Discussed plans with patient for ongoing care management follow up and provided patient with direct contact information for care management team . Collaboration with provider re: medication management  Patient Self Care Activities:  . Self administers medications as prescribed . Attends all scheduled provider appointments . Calls pharmacy for medication refills  Please see past updates related to this goal by clicking on the "Past Updates" button in the selected goal      . I would like to improve my medication adherence (pt-stated)       Current Barriers:  . Non Adherence to prescribed medication regimen--dispense report does not match with prescribed regimen.  Will continue to assess compliance.  Pharmacist Clinical Goal(s):  [name] Over the next 90 days, patient will demonstrate Improved medication adherence as evidenced by on time refills . Over the next 90 days, patient will work with PharmD and PCP to optimize medication management of chronic conditions.  Interventions: . Comprehensive  medication review performed.   . Reviewed & discussed the following diabetes-related information with patient: o Continue checking blood sugars as directed o Follow ADA recommended "diabetes-friendly" diet  (reviewed healthy snack/food options) o Confirmed current DM regimen: Ozempic 0.5mg  weekly, Actos Plus.  Will continue to optimize DM regimen.  Patient wishes to continue on current regimen.  We are hoping to optimize diet & exercise and potentially taper off of actos-->continue Ozempic and Metformin. o Discussed GLP-1 injection technique; Patient uses AccuCheck glucometer o Reviewed medication purpose/side effects-->patient denies adverse events, denies hypoglycemia, reports FBG<130, most recently in the upper 90s. o Most recent A1c is 6.5% o Continue taking all medications as prescribed by provider . Patient continues on statin, ASA, ARB as appropriate.  Statin last filled for 90-day supply(patient verbalizes 100% compliance/denies side effects.  Will follow up to address refills.  Patient Self Care Activities:  . Self administers medications as prescribed . Attends all scheduled provider appointments . Calls pharmacy for medication refills  Please see past updates related to this goal by clicking on the "Past Updates" button in the selected goal         The patient verbalized understanding of instructions provided today and declined a print copy of patient instruction materials.   The care management team will reach out to the patient again over the next 4 weeks.  Next PCP appt is on 03/07/19  Regina Eck, PharmD, [place] Clinical Pharmacist, [facility] [place]: [phone]

## 2019-03-07 ENCOUNTER — Ambulatory Visit
Admission: RE | Admit: 2019-03-07 | Discharge: 2019-03-07 | Disposition: A | Discharge status: Home / Self Care | Payer: Medicare Other | Source: Ambulatory Visit | Attending: Nurse Practitioner | Admitting: Nurse Practitioner

## 2019-03-07 ENCOUNTER — Other Ambulatory Visit: Payer: Self-pay | Admitting: Nurse Practitioner

## 2019-03-07 ENCOUNTER — Inpatient Hospital Stay: Payer: Medicare Other | Attending: Oncology

## 2019-03-07 ENCOUNTER — Encounter: Payer: Self-pay | Admitting: Nurse Practitioner

## 2019-03-07 ENCOUNTER — Ambulatory Visit (INDEPENDENT_AMBULATORY_CARE_PROVIDER_SITE_OTHER): Payer: Medicare Other | Admitting: Nurse Practitioner

## 2019-03-07 ENCOUNTER — Inpatient Hospital Stay: Payer: Medicare Other

## 2019-03-07 ENCOUNTER — Other Ambulatory Visit: Payer: Self-pay

## 2019-03-07 VITALS — BP 158/62 | HR 71 | Temp 98.5°F | Ht 62.0 in | Wt 214.6 lb

## 2019-03-07 DIAGNOSIS — C50412 Malignant neoplasm of upper-outer quadrant of left female breast: Secondary | ICD-10-CM | POA: Diagnosis not present

## 2019-03-07 DIAGNOSIS — C773 Secondary and unspecified malignant neoplasm of axilla and upper limb lymph nodes: Secondary | ICD-10-CM | POA: Diagnosis not present

## 2019-03-07 DIAGNOSIS — R0683 Snoring: Secondary | ICD-10-CM

## 2019-03-07 DIAGNOSIS — I5032 Chronic diastolic (congestive) heart failure: Secondary | ICD-10-CM

## 2019-03-07 DIAGNOSIS — I10 Essential (primary) hypertension: Secondary | ICD-10-CM | POA: Diagnosis not present

## 2019-03-07 DIAGNOSIS — I110 Hypertensive heart disease with heart failure: Secondary | ICD-10-CM

## 2019-03-07 DIAGNOSIS — Z95828 Presence of other vascular implants and grafts: Secondary | ICD-10-CM

## 2019-03-07 DIAGNOSIS — Z170 Estrogen receptor positive status [ER+]: Secondary | ICD-10-CM

## 2019-03-07 DIAGNOSIS — E1165 Type 2 diabetes mellitus with hyperglycemia: Secondary | ICD-10-CM | POA: Diagnosis not present

## 2019-03-07 DIAGNOSIS — Z794 Long term (current) use of insulin: Secondary | ICD-10-CM

## 2019-03-07 DIAGNOSIS — E785 Hyperlipidemia, unspecified: Secondary | ICD-10-CM | POA: Diagnosis not present

## 2019-03-07 DIAGNOSIS — D0511 Intraductal carcinoma in situ of right breast: Secondary | ICD-10-CM | POA: Insufficient documentation

## 2019-03-07 DIAGNOSIS — F319 Bipolar disorder, unspecified: Secondary | ICD-10-CM

## 2019-03-07 DIAGNOSIS — M1611 Unilateral primary osteoarthritis, right hip: Secondary | ICD-10-CM | POA: Diagnosis not present

## 2019-03-07 DIAGNOSIS — Z23 Encounter for immunization: Secondary | ICD-10-CM

## 2019-03-07 DIAGNOSIS — C778 Secondary and unspecified malignant neoplasm of lymph nodes of multiple regions: Secondary | ICD-10-CM

## 2019-03-07 DIAGNOSIS — M25551 Pain in right hip: Secondary | ICD-10-CM

## 2019-03-07 DIAGNOSIS — Z6839 Body mass index (BMI) 39.0-39.9, adult: Secondary | ICD-10-CM

## 2019-03-07 DIAGNOSIS — E1169 Type 2 diabetes mellitus with other specified complication: Secondary | ICD-10-CM

## 2019-03-07 LAB — COMPREHENSIVE METABOLIC PANEL
ALT: 11 U/L (ref 0–44)
AST: 11 U/L — ABNORMAL LOW (ref 15–41)
Albumin: 3.5 g/dL (ref 3.5–5.0)
Alkaline Phosphatase: 122 U/L (ref 38–126)
Anion gap: 9 (ref 5–15)
BUN: 25 mg/dL — ABNORMAL HIGH (ref 8–23)
CO2: 23 mmol/L (ref 22–32)
Calcium: 9.4 mg/dL (ref 8.9–10.3)
Chloride: 104 mmol/L (ref 98–111)
Creatinine, Ser: 1.03 mg/dL — ABNORMAL HIGH (ref 0.44–1.00)
GFR calc Af Amer: >60 mL/min (ref >60)
GFR calc non Af Amer: 57 mL/min — ABNORMAL LOW (ref >60)
Glucose, Bld: 263 mg/dL — ABNORMAL HIGH (ref 70–99)
Potassium: 4.1 mmol/L (ref 3.5–5.1)
Sodium: 136 mmol/L (ref 135–145)
Total Bilirubin: 0.3 mg/dL (ref 0.3–1.2)
Total Protein: 7 g/dL (ref 6.5–8.1)

## 2019-03-07 MED ORDER — SODIUM CHLORIDE 0.9% FLUSH
10.0000 mL | Freq: Once | INTRAVENOUS | Status: AC
  Administered 2019-03-07: 10 mL
  Filled 2019-03-07: qty 10

## 2019-03-07 MED ORDER — HEPARIN SOD (PORK) LOCK FLUSH 100 UNIT/ML IV SOLN
500.0000 [IU] | Freq: Once | INTRAVENOUS | Status: AC
  Administered 2019-03-07: 500 [IU]
  Filled 2019-03-07: qty 5

## 2019-03-07 MED ORDER — KETOROLAC TROMETHAMINE 60 MG/2ML IM SOLN
60.0000 mg | Freq: Once | INTRAMUSCULAR | Status: AC
  Administered 2019-03-07: 16:00:00 60 mg via INTRAMUSCULAR

## 2019-03-07 MED ORDER — TETANUS-DIPHTHERIA TOXOIDS TD 2-2 LF/0.5ML IM SUSP: 0.5000 mL | Freq: Once | INTRAMUSCULAR | 0 refills | Status: AC

## 2019-03-07 NOTE — Progress Notes (Addendum)
Subjective:     Patient ID: Kathy Maxwell , female    DOB: 03/21/1956 , 63 y.o.   MRN: [id number]   Chief Complaint  Patient presents with  . Diabetes    HPI  Diabetes She presents for her follow-up diabetic visit. She has type 2 diabetes mellitus. Her disease course has been stable. There are no hypoglycemic associated symptoms. Pertinent negatives for hypoglycemia include no dizziness or headaches. There are no diabetic associated symptoms. Pertinent negatives for diabetes include no blurred vision, no chest pain and no fatigue. There are no hypoglycemic complications. Symptoms are stable. There are no diabetic complications. Risk factors for coronary artery disease include obesity, sedentary lifestyle, dyslipidemia, diabetes mellitus and hypertension. Current diabetic treatment includes oral agent (dual therapy) and oral agent (monotherapy). She is compliant with treatment all of the time. She is following a generally unhealthy diet. She rarely participates in exercise. Her overall blood glucose range is >200 mg/dl. (170 blood sugars) An ACE inhibitor/angiotensin II receptor blocker is being taken. Eye exam is not current.  Hypertension Pertinent negatives include no blurred vision, chest pain, headaches or palpitations.  Hip Pain  The incident occurred more than 1 week ago. Injury mechanism: sitting on bench and it collapsed 2 months ago. Pain location: right hip and thigh. The pain is at a severity of 5/10. The pain is moderate. The pain has been constant since onset. Pertinent negatives include no inability to bear weight. She reports no foreign bodies present. Nothing aggravates the symptoms. She has tried acetaminophen and heat for the symptoms. The treatment provided mild relief.     Past Medical History:  Diagnosis Date  . Allergy    latex  . Arthritis   . Bipolar 1 disorder ([place])   . Bipolar 1 disorder ([place]) 04/04/2013  . Blood transfusion without reported diagnosis    2014  during chemotherapy  . Breast cancer ([place]) 03/10/13   left breast invasive ca  . Cancer ([place])   . Coronary artery disease   . Diabetes mellitus without complication (HCC)    NIDDM  . GERD (gastroesophageal reflux disease)   . Heart murmur   . Hyperlipidemia   . Hyperlipidemia 04/04/2013  . Hypertension   . Hypertension 04/04/2013  . Obesity   . Personal history of chemotherapy   . Personal history of radiation therapy   . Radiation 01/30/14-03/19/14  . Sleep apnea    no cpap due to insurance     Family History  Problem Relation Age of Onset  . Heart disease Mother   . Breast cancer Maternal Aunt        dx in her 45s  . Heart disease Cousin 22       maternal cousin  . Heart attack Sister 34  . Cancer Paternal Aunt        NOS  . Cancer Maternal Aunt        NOS  . Lung cancer Cousin 86       maternal cousin - non smoker  . Brain cancer Cousin 13       maternal cousin's son  . Lung cancer Cousin 40       maternal cousin's son  . Breast cancer Cousin        paternal cousin  . Colon cancer Neg Hx      Current Outpatient Medications:  .  amLODipine (NORVASC) 10 MG tablet, TAKE 1 TABLET BY MOUTH EVERY DAY, Disp: 90 tablet, Rfl: 1 .  anastrozole (ARIMIDEX)  1 MG tablet, Take 1 tablet (1 mg total) by mouth daily., Disp: 90 tablet, Rfl: 4 .  buPROPion (WELLBUTRIN SR) 150 MG 12 hr tablet, Take 1 tablet (150 mg total) by mouth daily., Disp: 90 tablet, Rfl: 1 .  cholecalciferol (VITAMIN D3) 25 MCG (1000 UT) tablet, Take 1 tablet (1,000 Units total) by mouth daily., Disp: 100 tablet, Rfl: 4 .  gabapentin (NEURONTIN) 100 MG capsule, TAKE 1 CAPSULE BY MOUTH 3 TIMES A DAY, Disp: 90 capsule, Rfl: 20 .  hydrALAZINE (APRESOLINE) 25 MG tablet, Take 25 mg by mouth 3 (three) times daily. , Disp: , Rfl:  .  losartan (COZAAR) 100 MG tablet, TAKE 1 TABLET BY MOUTH EVERY DAY, Disp: 90 tablet, Rfl: 1 .  metoprolol succinate (TOPROL-XL) 25 MG 24 hr tablet, TAKE 1 TABLET BY MOUTH EVERY DAY, Disp: 90  tablet, Rfl: 1 .  naproxen (NAPROSYN) 500 MG tablet, TAKE 1 TABLET BY MOUTH TWICE A DAY AS NEEDED, Disp: 60 tablet, Rfl: 0 .  ONETOUCH VERIO test strip, CHECK BLOOD SUGAR TWICE A DAY BEFORE BREAKFAST AND DINNER, Disp: 100 each, Rfl: 7 .  pantoprazole (PROTONIX) 40 MG tablet, Take 1 tablet (40 mg total) by mouth daily., Disp: 30 tablet, Rfl: 3 .  pioglitazone-metformin (ACTOPLUS MET) 15-850 MG tablet, TAKE 1 TABLET BY MOUTH EVERY DAY, Disp: 90 tablet, Rfl: 1 .  rosuvastatin (CRESTOR) 20 MG tablet, Take 1 tablet (20 mg total) by mouth daily., Disp: 30 tablet, Rfl: 12 .  Semaglutide,0.25 or 0.'5MG'$ /DOS, (OZEMPIC, 0.25 OR 0.5 MG/DOSE,) 2 MG/1.5ML SOPN, Inject 0.5 mg into the skin once a week., Disp: 1 pen, Rfl: 4 .  lidocaine-prilocaine (EMLA) cream, Apply topically as needed. Apply to port-a-cath one - two hours before accessing. (Patient not taking: Reported on 03/07/2019), Disp: 30 g, Rfl: 3 No current facility-administered medications for this visit.   Facility-Administered Medications Ordered in Other Visits:  .  sodium chloride 0.9 % injection 10 mL, 10 mL, Intravenous, PRN, Marcy Panning, MD, 10 mL at 05/24/13 1522   Allergies  Allergen Reactions  . Cephalexin Other (See Comments)    Burned throat.  . Adhesive [Tape] Rash    Clear tape gloves ,elastic no problem  . Latex Hives and Rash     Review of Systems  Constitutional: Negative for fatigue.  Eyes: Negative for blurred vision.  Respiratory: Negative.  Negative for cough.   Cardiovascular: Negative.  Negative for chest pain, palpitations and leg swelling.  Neurological: Negative for dizziness and headaches.     Today's Vitals   03/07/19 1539  BP: (!) 158/62  Pulse: 71  Temp: 98.5 F (36.9 C)  TempSrc: Oral  Weight: 214 lb 9.6 oz (97.3 kg)  Height: '5\' 2"'$  (1.575 m)  PainSc: 5   PainLoc: Leg   Body mass index is 39.25 kg/m.   Objective:  Physical Exam Constitutional:      General: She is not in acute distress.     Appearance: Normal appearance. She is obese.  Cardiovascular:     Rate and Rhythm: Normal rate and regular rhythm.     Pulses: Normal pulses.     Heart sounds: Normal heart sounds.  Pulmonary:     Effort: Pulmonary effort is normal. No respiratory distress.     Breath sounds: Normal breath sounds.  Chest:       Comments: Port Musculoskeletal:        General: Tenderness (right lateral hip with range of motion) present. No swelling, deformity or signs of  injury.     Right lower leg: No edema.     Left lower leg: No edema.  Skin:    General: Skin is warm and dry.     Capillary Refill: Capillary refill takes less than 2 seconds.  Neurological:     General: No focal deficit present.     Mental Status: She is alert and oriented to person, place, and time.  Psychiatric:        Mood and Affect: Mood normal.        Behavior: Behavior normal.        Thought Content: Thought content normal.        Judgment: Judgment normal.         Assessment And Plan:     1. Essential hypertension  Chronic, slightly elevated today however I will not make any changes to her medications  Increase water intake and educated on low salt diet  She is encouraged to make sure she is taking her medication every day  2. Hyperlipidemia, unspecified hyperlipidemia type  Chronic, controlled  Continue with current medications  3. 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  Dispense: 90 tablet; Refill: 1  4. Chronic diastolic heart failure (HCC)  Stable  5. Class 2 severe obesity due to excess calories with serious comorbidity and body mass index (BMI) of 39.0 to 39.9 in adult [facility])  Chronic  Discussed healthy diet and regular exercise options   Encouraged to exercise at least 150 minutes per  week with 2 days of strength training as tolerated  6. Right hip pain  She has been having right hip pain limiting her from walking long periods  Will check hip xray  Administered 60 mg toradol IM also encouraged to use over the counter pain cream - ketorolac (TORADOL) injection 60 mg - DG HIP UNILAT WITH PELVIS 2-3 VIEWS RIGHT; Future - DG Hip Unilat W OR W/O Pelvis 1V Right; Future  7. Snoring  She has not had a sleep study done in several years  Will refer for sleep study this may help with regulating her blood sugar and weight - Ambulatory referral to Sleep Studies  8. Type 2 diabetes mellitus with other specified complication, with long term current use of insulin  (HCC)  Chronic, stable.   Continue with current medications  Encouraged to limit intake of sugary foods and drinks  Encouraged to increase physical activity to 150 minutes per week as tolerated  She is to have her labs done at oncology when they draw from her port  9. Encounter for immunization  Tetanus vaccine Rx sent to pharmacy  - diptheria-tetanus toxoids [facility]) 2-2 LF/0.5ML injection; Inject 0.5 mLs into the muscle once for 1 dose.  Dispense: 0.5 mL; Refill: 0    [name], FNP    THE PATIENT IS ENCOURAGED TO PRACTICE SOCIAL DISTANCING DUE TO THE COVID-19 PANDEMIC.

## 2019-03-08 ENCOUNTER — Inpatient Hospital Stay: Payer: Medicare Other

## 2019-03-08 LAB — COMPREHENSIVE METABOLIC PANEL
ALT: 10 IU/L (ref 0–32)
AST: 10 IU/L (ref 0–40)
Albumin/Globulin Ratio: 1.7 (ref 1.2–2.2)
Albumin: 4 g/dL (ref 3.8–4.8)
Alkaline Phosphatase: 123 IU/L — ABNORMAL HIGH (ref 39–117)
BUN/Creatinine Ratio: 26 (ref 12–28)
BUN: 23 mg/dL (ref 8–27)
Bilirubin Total: 0.2 mg/dL (ref 0.0–1.2)
CO2: 22 mmol/L (ref 20–29)
Calcium: 9.5 mg/dL (ref 8.7–10.3)
Chloride: 101 mmol/L (ref 96–106)
Creatinine, Ser: 0.89 mg/dL (ref 0.57–1.00)
GFR calc Af Amer: 79 mL/min/{1.73_m2} (ref >59)
GFR calc non Af Amer: 68 mL/min/{1.73_m2} (ref >59)
Globulin, Total: 2.4 g/dL (ref 1.5–4.5)
Glucose: 266 mg/dL — ABNORMAL HIGH (ref 65–99)
Potassium: 4.2 mmol/L (ref 3.5–5.2)
Sodium: 137 mmol/L (ref 134–144)
Total Protein: 6.4 g/dL (ref 6.0–8.5)

## 2019-03-08 LAB — LIPID PANEL W/O CHOL/HDL RATIO
Cholesterol, Total: 135 mg/dL (ref 100–199)
HDL: 64 mg/dL (ref >39)
LDL Calculated: 56 mg/dL (ref 0–99)
Triglycerides: 75 mg/dL (ref 0–149)
VLDL Cholesterol Cal: 15 mg/dL (ref 5–40)

## 2019-03-08 LAB — HGB A1C W/O EAG: Hgb A1c MFr Bld: 7.6 % — ABNORMAL HIGH (ref 4.8–5.6)

## 2019-03-11 ENCOUNTER — Encounter: Payer: Self-pay | Admitting: Nurse Practitioner

## 2019-03-11 ENCOUNTER — Ambulatory Visit (INDEPENDENT_AMBULATORY_CARE_PROVIDER_SITE_OTHER): Payer: Medicare Other | Admitting: Pharmacist

## 2019-03-11 DIAGNOSIS — E1140 Type 2 diabetes mellitus with diabetic neuropathy, unspecified: Secondary | ICD-10-CM

## 2019-03-11 DIAGNOSIS — I10 Essential (primary) hypertension: Secondary | ICD-10-CM | POA: Diagnosis not present

## 2019-03-11 DIAGNOSIS — E785 Hyperlipidemia, unspecified: Secondary | ICD-10-CM | POA: Diagnosis not present

## 2019-03-18 NOTE — Progress Notes (Signed)
Chronic Care Management   Visit Note  03/11/2019 Name: Kathy Maxwell MRN: [id number] DOB: 04-Nov-1955  Referred by: [name], FNP Reason for referral : Chronic Care Management   Kathy Maxwell is a 63 y.o. year old female who is a primary care patient of [name], [place]. The CCM team was consulted for assistance with chronic disease management and care coordination needs.   Review of patient status, including review of consultants reports, relevant laboratory and other test results, and collaboration with appropriate care team members and the patient's provider was performed as part of comprehensive patient evaluation and provision of chronic care management services.    I spoke with [name] by telephone today.  Medications: Outpatient Encounter Medications as of 03/11/2019  Medication Sig Note  . amLODipine (NORVASC) 10 MG tablet TAKE 1 TABLET BY MOUTH EVERY DAY   . anastrozole (ARIMIDEX) 1 MG tablet Take 1 tablet (1 mg total) by mouth daily.   [name] buPROPion (WELLBUTRIN SR) 150 MG 12 hr tablet Take 1 tablet (150 mg total) by mouth daily.   . cholecalciferol (VITAMIN D3) 25 MCG (1000 UT) tablet Take 1 tablet (1,000 Units total) by mouth daily.   [name] gabapentin (NEURONTIN) 100 MG capsule TAKE 1 CAPSULE BY MOUTH 3 TIMES A DAY   . hydrALAZINE (APRESOLINE) 25 MG tablet Take 25 mg by mouth 3 (three) times daily.    [name] lidocaine-prilocaine (EMLA) cream Apply topically as needed. Apply to port-a-cath one - two hours before accessing. (Patient not taking: Reported on 03/07/2019)   . losartan (COZAAR) 100 MG tablet TAKE 1 TABLET BY MOUTH EVERY DAY   . metoprolol succinate (TOPROL-XL) 25 MG 24 hr tablet TAKE 1 TABLET BY MOUTH EVERY DAY   . naproxen (NAPROSYN) 500 MG tablet TAKE 1 TABLET BY MOUTH TWICE A DAY AS NEEDED   . ONETOUCH VERIO test strip CHECK BLOOD SUGAR TWICE A DAY BEFORE BREAKFAST AND DINNER   . pantoprazole (PROTONIX) 40 MG tablet Take 1 tablet (40 mg total) by mouth daily.   .  pioglitazone-metformin (ACTOPLUS MET) 15-850 MG tablet TAKE 1 TABLET BY MOUTH EVERY DAY   . rosuvastatin (CRESTOR) 20 MG tablet Take 1 tablet (20 mg total) by mouth daily.   . Semaglutide,0.25 or 0.'5MG'$ /DOS, (OZEMPIC, 0.25 OR 0.5 MG/DOSE,) 2 MG/1.5ML SOPN Inject 0.5 mg into the skin once a week. 11/14/2018: Injects on Fridays   Facility-Administered Encounter Medications as of 03/11/2019  Medication  . sodium chloride 0.9 % injection 10 mL     Objective:   Goals Addressed            This Visit's Progress     Patient Stated   . I would like to improve my medication adherence (pt-stated)       Current Barriers:  . Non Adherence to prescribed medication regimen--dispense report does not match with prescribed regimen.  Will continue to assess compliance.  Pharmacist Clinical Goal(s):  [name] Over the next 90 days, patient will demonstrate Improved medication adherence as evidenced by on time refills . Over the next 90 days, patient will work with PharmD and PCP to optimize medication management of chronic conditions.  Interventions: Call completed with patient on 03/11/19 . Comprehensive medication review performed.   . Reviewed & discussed the following diabetes-related information with patient: o Continue checking blood sugars as directed o Follow ADA recommended "diabetes-friendly" diet  (reviewed healthy snack/food options) o Confirmed current DM regimen: Ozempic 0.'5mg'$  weekly, Actos Plus.  Will continue to optimize  DM regimen.  Patient wishes to continue on current regimen.  We are hoping to optimize diet & exercise and potentially taper off of actos-->continue Ozempic and Metformin. o Discussed GLP-1 injection technique; Patient uses AccuCheck glucometer o Reviewed medication purpose/side effects-->patient denies adverse events, denies hypoglycemia, reports FBG<150, most recently in the upper 110s. o Most recent A1c increased from 6.5% -->7.6%.  Patient states diet was likely a  contributor.  She was also unable to afford Ozempic and had to go a few weeks without it.  She has now been supplied with Ozempic until the end of the year. o Continue taking all medications as prescribed by provider . Patient continues on statin, ASA, ARB as appropriate.  Statin last filled for 90-day supply(patient verbalizes 100% compliance/denies side effects.  Will follow up to address refills.  Patient Self Care Activities:  . Self administers medications as prescribed . Attends all scheduled provider appointments . Calls pharmacy for medication refills  Please see past updates related to this goal by clicking on the "Past Updates" button in the selected goal          Plan:   The care management team will reach out to the patient again over the next 3 weeks.  Regina Eck, PharmD, BCPS Clinical Pharmacist, [facility] [place]: [phone]

## 2019-03-18 NOTE — Patient Instructions (Signed)
Visit Information  Goals Addressed            This Visit's Progress     Patient Stated   . I would like to improve my medication adherence (pt-stated)       Current Barriers:  . Non Adherence to prescribed medication regimen--dispense report does not match with prescribed regimen.  Will continue to assess compliance.  Pharmacist Clinical Goal(s):  [name] Over the next 90 days, patient will demonstrate Improved medication adherence as evidenced by on time refills . Over the next 90 days, patient will work with PharmD and PCP to optimize medication management of chronic conditions.  Interventions: Call completed with patient on 03/11/19 . Comprehensive medication review performed.   . Reviewed & discussed the following diabetes-related information with patient: o Continue checking blood sugars as directed o Follow ADA recommended "diabetes-friendly" diet  (reviewed healthy snack/food options) o Confirmed current DM regimen: Ozempic 0.5mg  weekly, Actos Plus.  Will continue to optimize DM regimen.  Patient wishes to continue on current regimen.  We are hoping to optimize diet & exercise and potentially taper off of actos-->continue Ozempic and Metformin. o Discussed GLP-1 injection technique; Patient uses AccuCheck glucometer o Reviewed medication purpose/side effects-->patient denies adverse events, denies hypoglycemia, reports FBG<150, most recently in the upper 110s. o Most recent A1c increased from 6.5% -->7.6%.  Patient states diet was likely a contributor.  She was also unable to afford Ozempic and had to go a few weeks without it.  She has now been supplied with Ozempic until the end of the year. o Continue taking all medications as prescribed by provider . Patient continues on statin, ASA, ARB as appropriate.  Statin last filled for 90-day supply(patient verbalizes 100% compliance/denies side effects.  Will follow up to address refills.  Patient Self Care Activities:  . Self administers  medications as prescribed . Attends all scheduled provider appointments . Calls pharmacy for medication refills  Please see past updates related to this goal by clicking on the "Past Updates" button in the selected goal         The patient verbalized understanding of instructions provided today and declined a print copy of patient instruction materials.   The care management team will reach out to the patient again over the next 3 weeks.  Regina Eck, PharmD, BCPS Clinical Pharmacist, [facility] [place]: [phone]

## 2019-03-19 ENCOUNTER — Telehealth: Payer: Self-pay

## 2019-03-19 ENCOUNTER — Other Ambulatory Visit: Payer: Self-pay

## 2019-03-19 ENCOUNTER — Ambulatory Visit: Payer: Self-pay

## 2019-03-19 DIAGNOSIS — E1140 Type 2 diabetes mellitus with diabetic neuropathy, unspecified: Secondary | ICD-10-CM

## 2019-03-19 DIAGNOSIS — I10 Essential (primary) hypertension: Secondary | ICD-10-CM

## 2019-03-20 NOTE — Chronic Care Management (AMB) (Signed)
Chronic Care Management   Initial Visit Note  03/19/2019 Name: Kathy Maxwell MRN: [id number] DOB: 03-16-1956  Referred by: Kathy Brine, FNP Reason for referral : Chronic Care Management (#3 [facility] Telephone Outreach )   Kathy Maxwell is a 63 y.o. year old female who is a primary care patient of Kathy Maxwell, [place]. The CCM team was consulted for assistance with chronic disease management and care coordination needs.   Review of patient status, including review of consultants reports, relevant laboratory and other test results, and collaboration with appropriate care team members and the patient's provider was performed as part of comprehensive patient evaluation and provision of chronic care management services.    SDOH (Social Determinants of Health) screening performed today: None. See Care Plan for related entries.   I spoke with [name] by telephone today to establish her CCM RN CM plan of care.   Medications: Outpatient Encounter Medications as of 03/19/2019  Medication Sig Note  . amLODipine (NORVASC) 10 MG tablet TAKE 1 TABLET BY MOUTH EVERY DAY   . anastrozole (ARIMIDEX) 1 MG tablet Take 1 tablet (1 mg total) by mouth daily.   [name] buPROPion (WELLBUTRIN SR) 150 MG 12 hr tablet Take 1 tablet (150 mg total) by mouth daily.   . cholecalciferol (VITAMIN D3) 25 MCG (1000 UT) tablet Take 1 tablet (1,000 Units total) by mouth daily.   [name] gabapentin (NEURONTIN) 100 MG capsule TAKE 1 CAPSULE BY MOUTH 3 TIMES A DAY   . hydrALAZINE (APRESOLINE) 25 MG tablet Take 25 mg by mouth 3 (three) times daily.    [name] lidocaine-prilocaine (EMLA) cream Apply topically as needed. Apply to port-a-cath one - two hours before accessing. (Patient not taking: Reported on 03/07/2019)   . losartan (COZAAR) 100 MG tablet TAKE 1 TABLET BY MOUTH EVERY DAY   . metoprolol succinate (TOPROL-XL) 25 MG 24 hr tablet TAKE 1 TABLET BY MOUTH EVERY DAY   . naproxen (NAPROSYN) 500 MG tablet TAKE 1 TABLET BY MOUTH  TWICE A DAY AS NEEDED   . ONETOUCH VERIO test strip CHECK BLOOD SUGAR TWICE A DAY BEFORE BREAKFAST AND DINNER   . pantoprazole (PROTONIX) 40 MG tablet Take 1 tablet (40 mg total) by mouth daily.   . pioglitazone-metformin (ACTOPLUS MET) 15-850 MG tablet TAKE 1 TABLET BY MOUTH EVERY DAY   . rosuvastatin (CRESTOR) 20 MG tablet Take 1 tablet (20 mg total) by mouth daily.   . Semaglutide,0.25 or 0.'5MG'$ /DOS, (OZEMPIC, 0.25 OR 0.5 MG/DOSE,) 2 MG/1.5ML SOPN Inject 0.5 mg into the skin once a week. 11/14/2018: Injects on Fridays   Facility-Administered Encounter Medications as of 03/19/2019  Medication  . sodium chloride 0.9 % injection 10 mL     Objective:  Lab Results  Component Value Date   HGBA1C 7.6 (H) 03/07/2019   HGBA1C 6.5 (H) 10/08/2018   HGBA1C 10.1 (H) 03/01/2018   Lab Results  Component Value Date   MICROALBUR 150 06/06/2018   LDLCALC 56 03/07/2019   CREATININE 1.03 (H) 03/07/2019   BP Readings from Last 3 Encounters:  03/07/19 (!) 158/62  11/22/18 (!) 162/72  10/08/18 (!) 177/73    Goals Addressed      Patient Stated   . 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 Impaired Physical Mobility . Poor gait/balance . Inability to exercise secondary to Right  hip pain  Nurse Case Manager Clinical Goal(s):  [name] Over the next 60 days, patient will work with the Orthopedic MD to address needs related to treatment management for right hip pain  CCM RN CM Interventions:  03/19/19 call completed with patient  . Evaluation of current treatment plan related to diagnosis and treatment of right hip and patient's adherence to plan as established by provider. . Provided education to patient re: right hip imaging results noted to impression by PCP provider Kathy Brine, FNP; discussed Kathy Maxwell recommends an Orthopedic consultation to evaluate and treatment of  severe arthritis to right hip; discussed patient sometimes has difficulty with balance and will benefit from using a cane (she currently has a 4 legged cane but wishes to switch over to a straight cane) patient encouraged to use cane at all times when ambulating and to avoid walking on uneven payment/ground; patient denies having home safety concerns at this time; patient denies having falls but reports having some near falls due to severe hip pain and impaired mobility as a result; discussed patient may benefit from receiving PT/OT to teach safe and effective exercises that will help with balance, gait and overall strengthening and stamina; discussed patient would like to f/u with an Orthopedic MD and will consider PT in the future . Collaborated with Kathy Brine, FNP regarding patient would like to proceed with f/u with an Orthopedic MD for treatment and evaluation of pain to right hip . Discussed plans with patient for ongoing care management follow up and provided patient with direct contact information for care management team  Patient Self Care Activities:  . Self administers medications as prescribed . Attends all scheduled provider appointments . Calls pharmacy for medication refills . Attends church or other social activities . Performs ADL's independently . Performs IADL's independently . Calls provider office for new concerns or questions  Initial goal documentation     . "I would like to learn how to eat better to help my diabetes" (pt-stated)       Current Barriers:  [name] Knowledge Deficits related to diabetes disease process and Self Health Management  . Film/video editor.   Nurse Case Manager Clinical Goal(s):  [name] Over the next 60 days, patient will work with the CCM team to address needs related to improved adherence and understanding of diabetes disease process and Self Health management as evidence by patient will verbalize 100% adherence to taking her medications as  prescribed and implementing portion control while following a diabetic friendly diet.  . Over the next 90 days, patient will lower her A1C below 7.6%  CCM RN CM Interventions:  03/19/19 call completed with patient  . Evaluation of current treatment plan related to diabetes disease management and patient's adherence to plan as established by provider. . Provided education to patient re: how to achieve a lower A1C; discussed recent increase to A1C from 6.5 to 7.6; discussed target A1C is <7.0; patient admitted she was not taking her diabetic medications due to cost issues; she was not adhering to her diabetic diet; discussed patient is interested in learning how to eat better using portion control . Reviewed medications with patient and discussed importance of medication adherence along with following a ADA diet, and implementing exercise in her daily routine as tolerated; reviewed current regimen includes, Ozempic 0.'5mg'$  weekly injection, ActoPlus-Met 15-850 mg 1 tablet daily  . Discussed plans with patient for ongoing care management follow up and provided patientCC with direct contact information for care management team .  Provided patient with printed educational materials related to Diabetic Meal Planning Using the Plate Method and portion control, s/s Hypo/Hyperglycemia, Diabetes Zone Management tool  Patient Self Care Activities:  . Self administers medications as prescribed . Attends all scheduled provider appointments . Calls pharmacy for medication refills . Attends church or other social activities . Performs ADL's independently . Performs IADL's independently . Calls provider office for new concerns or questions  Initial goal documentation         Plan:   Telephone follow up appointment with care management team member scheduled for: 03/29/19  Barb Merino, RN, BSN, CCM Care Management Coordinator [place] [facility]  Direct Phone:  [phone]

## 2019-03-20 NOTE — Patient Instructions (Signed)
Visit Information  Goals Addressed      Patient Stated   . 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 Impaired Physical Mobility . Poor gait/balance . Inability to exercise secondary to Right hip pain  Nurse Case Manager Clinical Goal(s):  [name] Over the next 60 days, patient will work with the Orthopedic MD to address needs related to treatment management for right hip pain  CCM RN CM Interventions:  03/19/19 call completed with patient   . Evaluation of current treatment plan related to diagnosis and treatment of right hip and patient's adherence to plan as established by provider. . Provided education to patient re: right hip imaging results noted to impression by PCP provider [name], FNP; discussed Doreene Burke recommends an Orthopedic consultation to evaluate and treatment of severe arthritis to right hip; discussed patient sometimes has difficulty with balance and will benefit from using a cane (she currently has a 4 legged cane but wishes to switch over to a straight cane) patient encouraged to use cane at all times when ambulating and to avoid walking on uneven payment/ground; patient denies having home safety concerns at this time; patient denies having falls but reports having some near falls due to severe hip pain and impaired mobility as a result; discussed patient may benefit from receiving PT/OT to teach safe and effective exercises that will help with balance, gait and overall strengthening and stamina; discussed patient would like to f/u with an Orthopedic MD and will consider PT in the future . Collaborated with [name], FNP regarding patient would like to proceed with f/u with an Orthopedic MD for treatment and evaluation of pain to right hip . Discussed plans with patient for ongoing care management follow up and provided patient with direct  contact information for care management team  Patient Self Care Activities:  . Self administers medications as prescribed . Attends all scheduled provider appointments . Calls pharmacy for medication refills . Attends church or other social activities . Performs ADL's independently . Performs IADL's independently . Calls provider office for new concerns or questions  Initial goal documentation     . "I would like to learn how to eat better to help my diabetes" (pt-stated)       Current Barriers:  [name] Knowledge Deficits related to diabetes disease process and Self Health Management  . Film/video editor.   Nurse Case Manager Clinical Goal(s):  [name] Over the next 60 days, patient will work with the CCM team to address needs related to improved adherence and understanding of diabetes disease process and Self Health management as evidence by patient will verbalize 100% adherence to taking her medications as prescribed and implementing portion control while following a diabetic friendly diet.  . Over the next 90 days, patient will lower her A1C below 7.6%  CCM RN CM Interventions:  03/19/19 call completed with patient  . Evaluation of current treatment plan related to diabetes disease management and patient's adherence to plan as established by provider. . Provided education to patient re: how to achieve a lower A1C; discussed recent increase to A1C from 6.5 to 7.6; discussed target A1C is <7.0; patient admitted she was not taking her diabetic medications due to cost issues; she was not adhering to her diabetic diet; discussed patient is interested in learning how to eat better using portion control . Reviewed  medications with patient and discussed importance of medication adherence along with following a ADA diet, and implementing exercise in her daily routine as tolerated; reviewed current regimen includes, Ozempic 0.'5mg'$  weekly injection, ActoPlus-Met 15-850 mg 1 tablet daily  . Discussed  plans with patient for ongoing care management follow up and provided patient with direct contact information for care management team . Provided patient with printed educational materials related to Diabetic Meal Planning Using the Plate Method and portion control, s/s Hypo/Hyperglycemia, Diabetes Zone Management tool  Patient Self Care Activities:  . Self administers medications as prescribed . Attends all scheduled provider appointments . Calls pharmacy for medication refills . Attends church or other social activities . Performs ADL's independently . Performs IADL's independently . Calls provider office for new concerns or questions  Initial goal documentation        The patient verbalized understanding of instructions provided today and declined a print copy of patient instruction materials.   Telephone follow up appointment with care management team member scheduled for: 03/29/19  Barb Merino, RN, BSN, CCM Care Management Coordinator [place] [facility]  Direct Phone: [phone]

## 2019-03-27 ENCOUNTER — Other Ambulatory Visit: Payer: Self-pay | Admitting: Nurse Practitioner

## 2019-03-27 DIAGNOSIS — M25551 Pain in right hip: Secondary | ICD-10-CM

## 2019-03-28 ENCOUNTER — Ambulatory Visit (INDEPENDENT_AMBULATORY_CARE_PROVIDER_SITE_OTHER): Payer: Medicare Other

## 2019-03-28 DIAGNOSIS — E1140 Type 2 diabetes mellitus with diabetic neuropathy, unspecified: Secondary | ICD-10-CM | POA: Diagnosis not present

## 2019-03-28 DIAGNOSIS — I10 Essential (primary) hypertension: Secondary | ICD-10-CM | POA: Diagnosis not present

## 2019-03-28 NOTE — Patient Instructions (Signed)
Visit Information  Goals Addressed      Patient Stated   . 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 Impaired Physical Mobility . Poor gait/balance . Inability to exercise secondary to Right hip pain  Nurse Case Manager Clinical Goal(s):  [name] Over the next 60 days, patient will work with the Orthopedic MD to address needs related to treatment management for right hip pain  CCM RN CM Interventions:  03/19/19 call completed with patient   . Evaluation of current treatment plan related to diagnosis and treatment of right hip and patient's adherence to plan as established by provider. . Provided education to patient re: right hip imaging results noted to impression by PCP provider [name], FNP; discussed Doreene Burke recommends an Orthopedic consultation to evaluate and treatment of severe arthritis to right hip; discussed patient sometimes has difficulty with balance and will benefit from using a cane (she currently has a 4 legged cane but wishes to switch over to a straight cane) patient encouraged to use cane at all times when ambulating and to avoid walking on uneven payment/ground; patient denies having home safety concerns at this time; patient denies having falls but reports having some near falls due to severe hip pain and impaired mobility as a result; discussed patient may benefit from receiving PT/OT to teach safe and effective exercises that will help with balance, gait and overall strengthening and stamina; discussed patient would like to f/u with an Orthopedic MD and will consider PT in the future . Collaborated with [name], FNP regarding patient would like to proceed with f/u with an Orthopedic MD for treatment and evaluation of pain to right hip . Discussed plans with patient for ongoing care management follow up and provided patient with direct  contact information for care management team  03/28/19 Provider collaboration   . Received in basket message from provider [name], FNP advising an Orthopedic referral has been placed for [name] also advised a Rx for a cane was faxed to [place] . Placed outbound call to [facility] [phone], spoke with [place] who confirmed the DME order has not been received; discussed [place] has a new fax # [phone] . Sent in basket message to provider [name], FNP and CMA Michelle Nasuti and Octavio Manns with notification to refax the DME order to the new fax # stated above for [facility] . Placed outbound call to patient to advise the Orthopedic referral was placed and is pending; advised the Orthopedic office will contact her once the referral is assigned to a provider  . Advised a Rx for a cane has been faxed to [place]  . Discussed plans with patient for ongoing care management follow up and provided patient with direct contact information for care management team  Patient Self Care Activities:  . Self administers medications as prescribed . Attends all scheduled provider appointments . Calls pharmacy for medication refills . Attends church or other social activities . Performs ADL's independently . Performs IADL's independently . Calls provider office for new concerns or questions  Please see past updates related to this goal by clicking on the "Past Updates" button in the selected goal         The patient verbalized understanding of instructions provided today and declined a print copy of patient instruction materials.   Telephone  follow up appointment with care management team member scheduled for: 04/04/19  Barb Merino, RN, BSN, CCM Care Management Coordinator [place] [facility]  Direct Phone: [phone]

## 2019-03-28 NOTE — Chronic Care Management (AMB) (Signed)
Chronic Care Management   Follow Up Note   03/28/2019 Name: Kathy Maxwell MRN: [id number] DOB: 1956-03-18  Referred by: [name], FNP Reason for referral : Chronic Care Management (CCM RNCM Provider Collaboration )   Kathy Maxwell is a 63 y.o. year old female who is a primary care patient of [name], [place]. The CCM team was consulted for assistance with chronic disease management and care coordination needs.    Review of patient status, including review of consultants reports, relevant laboratory and other test results, and collaboration with appropriate care team members and the patient's provider was performed as part of comprehensive patient evaluation and provision of chronic care management services.    SDOH (Social Determinants of Health) screening performed today: None. See Care Plan for related entries.   I spoke with [name] by telephone today to update her on the Orthopedic referral and Rx for a cane.   Outpatient Encounter Medications as of 03/28/2019  Medication Sig Note  . amLODipine (NORVASC) 10 MG tablet TAKE 1 TABLET BY MOUTH EVERY DAY   . anastrozole (ARIMIDEX) 1 MG tablet Take 1 tablet (1 mg total) by mouth daily.   [name] buPROPion (WELLBUTRIN SR) 150 MG 12 hr tablet Take 1 tablet (150 mg total) by mouth daily.   . cholecalciferol (VITAMIN D3) 25 MCG (1000 UT) tablet Take 1 tablet (1,000 Units total) by mouth daily.   [name] gabapentin (NEURONTIN) 100 MG capsule TAKE 1 CAPSULE BY MOUTH 3 TIMES A DAY   . hydrALAZINE (APRESOLINE) 25 MG tablet Take 25 mg by mouth 3 (three) times daily.    [name] lidocaine-prilocaine (EMLA) cream Apply topically as needed. Apply to port-a-cath one - two hours before accessing. (Patient not taking: Reported on 03/07/2019)   . losartan (COZAAR) 100 MG tablet TAKE 1 TABLET BY MOUTH EVERY DAY   . metoprolol succinate (TOPROL-XL) 25 MG 24 hr tablet TAKE 1 TABLET BY MOUTH EVERY DAY   . naproxen (NAPROSYN) 500 MG tablet TAKE 1 TABLET BY MOUTH TWICE  A DAY AS NEEDED   . ONETOUCH VERIO test strip CHECK BLOOD SUGAR TWICE A DAY BEFORE BREAKFAST AND DINNER   . pantoprazole (PROTONIX) 40 MG tablet Take 1 tablet (40 mg total) by mouth daily.   . pioglitazone-metformin (ACTOPLUS MET) 15-850 MG tablet TAKE 1 TABLET BY MOUTH EVERY DAY   . rosuvastatin (CRESTOR) 20 MG tablet Take 1 tablet (20 mg total) by mouth daily.   . Semaglutide,0.25 or 0.'5MG'$ /DOS, (OZEMPIC, 0.25 OR 0.5 MG/DOSE,) 2 MG/1.5ML SOPN Inject 0.5 mg into the skin once a week. 11/14/2018: Injects on Fridays   Facility-Administered Encounter Medications as of 03/28/2019  Medication  . sodium chloride 0.9 % injection 10 mL     Goals Addressed      Patient Stated   . 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 Impaired Physical Mobility . Poor gait/balance . Inability to exercise secondary to Right hip pain  Nurse Case Manager Clinical Goal(s):  [name] Over the next 60 days, patient will work with the Orthopedic MD to address needs related to treatment management for right hip pain  CCM RN CM Interventions:  03/19/19 call completed with patient   . Evaluation of current treatment plan related to diagnosis and treatment of right hip and patient's adherence to plan as established by provider. . Provided  education to patient re: right hip imaging results noted to impression by PCP provider [name], FNP; discussed Doreene Burke recommends an Orthopedic consultation to evaluate and treatment of severe arthritis to right hip; discussed patient sometimes has difficulty with balance and will benefit from using a cane (she currently has a 4 legged cane but wishes to switch over to a straight cane) patient encouraged to use cane at all times when ambulating and to avoid walking on uneven payment/ground; patient denies having home safety concerns at this time; patient denies  having falls but reports having some near falls due to severe hip pain and impaired mobility as a result; discussed patient may benefit from receiving PT/OT to teach safe and effective exercises that will help with balance, gait and overall strengthening and stamina; discussed patient would like to f/u with an Orthopedic MD and will consider PT in the future . Collaborated with [name], FNP regarding patient would like to proceed with f/u with an Orthopedic MD for treatment and evaluation of pain to right hip . Discussed plans with patient for ongoing care management follow up and provided patient with direct contact information for care management team  03/28/19 Provider collaboration   . Received in basket message from provider [name], FNP advising an Orthopedic referral has been placed for [name] also advised a Rx for a cane was faxed to [place] . Placed outbound call to [facility] [phone], spoke with [place] who confirmed the DME order has not been received; discussed [place] has a new fax # [phone] . Sent in basket message to provider [name], FNP and CMA Michelle Nasuti and Octavio Manns with notification to refax the DME order to the new fax # stated above for [facility] . Placed outbound call to patient to advise the Orthopedic referral was placed and is pending; advised the Orthopedic office will contact her once the referral is assigned to a provider  . Advised a Rx for a cane has been faxed to [place]  . Discussed plans with patient for ongoing care management follow up and provided patient with direct contact information for care management team  Patient Self Care Activities:  . Self administers medications as prescribed . Attends all scheduled provider appointments . Calls pharmacy for medication refills . Attends church or other social activities . Performs ADL's independently . Performs IADL's independently . Calls provider office  for new concerns or questions  Please see past updates related to this goal by clicking on the "Past Updates" button in the selected goal         Telephone follow up appointment with care management team member scheduled for: 04/04/19   Barb Merino, RN, BSN, CCM Care Management Coordinator [place] [facility]  Direct Phone: [phone]

## 2019-03-29 ENCOUNTER — Telehealth: Payer: Self-pay

## 2019-04-01 ENCOUNTER — Telehealth: Payer: Self-pay

## 2019-04-01 DIAGNOSIS — B351 Tinea unguium: Secondary | ICD-10-CM | POA: Diagnosis not present

## 2019-04-01 DIAGNOSIS — M79674 Pain in right toe(s): Secondary | ICD-10-CM | POA: Diagnosis not present

## 2019-04-01 DIAGNOSIS — M2041 Other hammer toe(s) (acquired), right foot: Secondary | ICD-10-CM | POA: Diagnosis not present

## 2019-04-04 ENCOUNTER — Other Ambulatory Visit: Payer: Self-pay

## 2019-04-04 ENCOUNTER — Telehealth: Payer: Self-pay

## 2019-04-04 ENCOUNTER — Ambulatory Visit: Payer: Self-pay

## 2019-04-04 ENCOUNTER — Other Ambulatory Visit: Payer: Self-pay | Admitting: Nurse Practitioner

## 2019-04-04 DIAGNOSIS — E119 Type 2 diabetes mellitus without complications: Secondary | ICD-10-CM | POA: Diagnosis not present

## 2019-04-04 DIAGNOSIS — E1140 Type 2 diabetes mellitus with diabetic neuropathy, unspecified: Secondary | ICD-10-CM

## 2019-04-04 DIAGNOSIS — I10 Essential (primary) hypertension: Secondary | ICD-10-CM

## 2019-04-05 NOTE — Chronic Care Management (AMB) (Addendum)
Chronic Care Management   Follow Up Note   04/04/2019 Name: Kathy Maxwell MRN: [id number] DOB: 04/20/1956  Referred by: Moore, Janece, FNP Reason for referral : Chronic Care Management (CCM RNCM Telephone Outreach)   Kathy Maxwell is a 63 y.o. year old female who is a primary care patient of Moore, Janece, FNP. The CCM team was consulted for assistance with chronic disease management and care coordination needs.    Review of patient status, including review of consultants reports, relevant laboratory and other test results, and collaboration with appropriate care team members and the patient's provider was performed as part of comprehensive patient evaluation and provision of chronic care management services.    SDOH (Social Determinants of Health) screening performed today: None. See Care Plan for related entries.   I received a call from Ms. Dunlop today to request an Rx for a rollator walker.   Outpatient Encounter Medications as of 04/04/2019  Medication Sig Note  . amLODipine (NORVASC) 10 MG tablet TAKE 1 TABLET BY MOUTH EVERY DAY   . anastrozole (ARIMIDEX) 1 MG tablet Take 1 tablet (1 mg total) by mouth daily.   . buPROPion (WELLBUTRIN SR) 150 MG 12 hr tablet Take 1 tablet (150 mg total) by mouth daily.   . cholecalciferol (VITAMIN D3) 25 MCG (1000 UT) tablet Take 1 tablet (1,000 Units total) by mouth daily.   . gabapentin (NEURONTIN) 100 MG capsule TAKE 1 CAPSULE BY MOUTH 3 TIMES A DAY   . hydrALAZINE (APRESOLINE) 25 MG tablet Take 25 mg by mouth 3 (three) times daily.    . lidocaine-prilocaine (EMLA) cream Apply topically as needed. Apply to port-a-cath one - two hours before accessing. (Patient not taking: Reported on 03/07/2019)   . losartan (COZAAR) 100 MG tablet TAKE 1 TABLET BY MOUTH EVERY DAY   . metoprolol succinate (TOPROL-XL) 25 MG 24 hr tablet TAKE 1 TABLET BY MOUTH EVERY DAY   . naproxen (NAPROSYN) 500 MG tablet TAKE 1 TABLET BY MOUTH TWICE A DAY AS NEEDED   .  ONETOUCH VERIO test strip CHECK BLOOD SUGAR TWICE A DAY BEFORE BREAKFAST AND DINNER   . pantoprazole (PROTONIX) 40 MG tablet Take 1 tablet (40 mg total) by mouth daily.   . pioglitazone-metformin (ACTOPLUS MET) 15-850 MG tablet TAKE 1 TABLET BY MOUTH EVERY DAY   . rosuvastatin (CRESTOR) 20 MG tablet Take 1 tablet (20 mg total) by mouth daily.   . Semaglutide,0.25 or 0.5MG/DOS, (OZEMPIC, 0.25 OR 0.5 MG/DOSE,) 2 MG/1.5ML SOPN Inject 0.5 mg into the skin once a week. 11/14/2018: Injects on Fridays   Facility-Administered Encounter Medications as of 04/04/2019  Medication  . sodium chloride 0.9 % injection 10 mL     Goals Addressed      Patient Stated   . "I am still having pain in my right hip" (pt-stated)       Current Barriers:  . 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 Impaired Physical Mobility . Poor gait/balance . Inability to exercise secondary to Right hip pain  Nurse Case Manager Clinical Goal(s):  . Over the next 60 days, patient will work with the Orthopedic MD to address needs related to treatment management for right hip pain  CCM RN CM Interventions:  04/04/19 call completed with patient   . Evaluation of current treatment plan related to diagnosis and treatment of right hip and patient's adherence to plan as established by provider . Discussed patient has changed her mind   about purchasing a cane at this time but would like to purchase a rollator walker; discussed having a new Rx sent to [place] for the rollator . Discussed patient will continue to use the cane she has on hand; patient encouraged to wear good supportive shoes and to avoid walking on uneven ground . Discussed the referral for Orthopedics has been sent to [facility] and she should receive a call from their office . Collaborated with [name], FNP regarding patients request for Rx for a rollator walker instead of the cane; requested Rx for a  rollator be faxed to [place] and should include the patient's ht/wt . Discussed plans with patient for ongoing care management follow up and provided patient with direct contact information for care management team  Patient Self Care Activities:  . Self administers medications as prescribed . Attends all scheduled provider appointments . Calls pharmacy for medication refills . Attends church or other social activities . Performs ADL's independently . Performs IADL's independently . Calls provider office for new concerns or questions  Please see past updates related to this goal by clicking on the "Past Updates" button in the selected goal         Telephone follow up appointment with care management team member scheduled for: 04/08/19   Barb Merino, RN, BSN, CCM Care Management Coordinator [facility]  Direct Phone: [phone]

## 2019-04-05 NOTE — Patient Instructions (Addendum)
Visit Information  Goals Addressed      Patient Stated   . 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 Impaired Physical Mobility . Poor gait/balance . Inability to exercise secondary to Right hip pain  Nurse Case Manager Clinical Goal(s):  [name] Over the next 60 days, patient will work with the Orthopedic MD to address needs related to treatment management for right hip pain  CCM RN CM Interventions:  04/04/19 call completed with patient   . Evaluation of current treatment plan related to diagnosis and treatment of right hip and patient's adherence to plan as established by provider . Discussed patient has changed her mind about purchasing a cane at this time but would like to purchase a rollator walker; discussed having a new Rx sent to [place] for the rollator . Discussed patient will continue to use the cane she has on hand; patient encouraged to wear good supportive shoes and to avoid walking on uneven ground . Discussed the referral for Orthopedics has been sent to [facility] and she should receive a call from their office . Collaborated with [name], FNP regarding patients request for Rx for a rollator walker instead of the cane; requested Rx for a rollator be faxed to [place] and should include the patient's ht/wt . Discussed plans with patient for ongoing care management follow up and provided patient with direct contact information for care management team   Patient Self Care Activities:  . Self administers medications as prescribed . Attends all scheduled provider appointments . Calls pharmacy for medication refills . Attends church or other social activities . Performs ADL's independently . Performs IADL's independently . Calls provider office for new concerns or questions  Please see past updates related to this goal by  clicking on the "Past Updates" button in the selected goal         The patient verbalized understanding of instructions provided today and declined a print copy of patient instruction materials.   Telephone follow up appointment with care management team member scheduled for: 04/08/19  Barb Merino, RN, BSN, CCM Care Management Coordinator [place] [facility]  Direct Phone: [phone]

## 2019-04-08 ENCOUNTER — Telehealth: Payer: Self-pay

## 2019-04-08 DIAGNOSIS — M1611 Unilateral primary osteoarthritis, right hip: Secondary | ICD-10-CM | POA: Diagnosis not present

## 2019-04-17 ENCOUNTER — Telehealth: Payer: Self-pay | Admitting: Oncology

## 2019-04-17 NOTE — Telephone Encounter (Signed)
Returned patient's phone call regarding rescheduling 10/01 appointment time, per patient's request appointment time has been rescheduled.

## 2019-04-18 ENCOUNTER — Other Ambulatory Visit: Payer: Self-pay | Admitting: Nurse Practitioner

## 2019-04-18 ENCOUNTER — Ambulatory Visit: Payer: Self-pay | Admitting: Pharmacist

## 2019-04-18 ENCOUNTER — Ambulatory Visit: Payer: Self-pay

## 2019-04-18 DIAGNOSIS — E1140 Type 2 diabetes mellitus with diabetic neuropathy, unspecified: Secondary | ICD-10-CM

## 2019-04-18 DIAGNOSIS — I10 Essential (primary) hypertension: Secondary | ICD-10-CM

## 2019-04-18 MED ORDER — LOSARTAN POTASSIUM 100 MG PO TABS: 100.0000 mg | ORAL_TABLET | Freq: Every day | ORAL | 1 refills | Status: AC

## 2019-04-18 NOTE — Progress Notes (Signed)
  Chronic Care Management   Outreach Note  04/18/2019 Name: Kathy Maxwell MRN: [id number] DOB: [birth year]  Referred by: [name], FNP Reason for referral : Chronic care management   An unsuccessful telephone outreach was attempted today. The patient was referred to the case management team by for assistance with care management and care coordination.   Follow Up Plan: A HIPPA compliant phone message was left for the patient providing contact information and requesting a return call.  The care management team will reach out to the patient again over the next 3-5 business days.   Regina Eck, PharmD, BCPS Clinical Pharmacist, [facility] [place]: [phone]

## 2019-04-18 NOTE — Patient Instructions (Signed)
Social Worker Visit Information  Goals we discussed today:  Goals Addressed            This Visit's Progress     Patient Stated   . 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 Impaired Physical Mobility . Poor gait/balance . Inability to exercise secondary to Right hip pain  Nurse Case Manager Clinical Goal(s):  [name] Over the next 60 days, patient will work with the Orthopedic MD to address needs related to treatment management for right hip pain  CCM SW Interventions Completed 04/18/2019 . Outbound call placed to the patient to assist with chronic care management and care coordination . Confirmed the patients rollator was received from DME company. Unfortunately, the patient is currently in [facility] and the rollator was delivered to her [place] address "I go between [place] and [place] because my nephew is also sick" . Assessed for patients ability to obtain equipment . Determined the patient has a scheduled provider appointment with Oncology on 04/19/2019 in [place] and plans to obtain equipment at that time . Collaboration with RN Case Manager regarding patient progression of current goal  CCM RN CM Interventions:  04/04/19 call completed with patient   . Evaluation of current treatment plan related to diagnosis and treatment of right hip and patient's adherence to plan as established by provider . Discussed patient has changed her mind about purchasing a cane at this time but would like to purchase a rollator walker; discussed having a new Rx sent to [place] for the rollator . Discussed patient will continue to use the cane she has on hand; patient encouraged to wear good supportive shoes and to avoid walking on uneven ground . Discussed the referral for Orthopedics has been sent to [facility] and she should receive a  call from their office . Collaborated with [name], FNP regarding patients request for Rx for a rollator walker instead of the cane; requested Rx for a rollator be faxed to [place] and should include the patient's ht/wt . Discussed plans with patient for ongoing care management follow up and provided patient with direct contact information for care management team   Patient Self Care Activities:  . Self administers medications as prescribed . Attends all scheduled provider appointments . Calls pharmacy for medication refills . Attends church or other social activities . Performs ADL's independently . Performs IADL's independently . Calls provider office for new concerns or questions  Please see past updates related to this goal by clicking on the "Past Updates" button in the selected goal      . I would like to improve my medication adherence (pt-stated)       Current Barriers:  . Non Adherence to prescribed medication regimen--dispense report does not match with prescribed regimen.  Will continue to assess compliance.  Pharmacist Clinical Goal(s):  [name] Over the next 90 days, patient will demonstrate Improved medication adherence as evidenced by on time refills . Over the next 90 days, patient will work with PharmD and PCP to optimize medication management of chronic conditions.  CCM SW Interventions Completed 04/18/2019 . Outbound call placed to the patient to assist with care coordination of chronic care management . Reviewed patients current medication regimen . Determined the patient is consistent with medications stating "I take my medicine every day and my needle once  a week" . Performed chart review to determined the patients moss recent A1C (03/07/2019) was 7.6 . Discussed DM diet compliance and patients current CBG readings . Determined the patient feels her DM has improved as she used to have fasting sugar readings close to 200 and the average is now at 130 each  morning . Encouraged the patient to continue following care team recommendations and participating in a diabetes friendly diet . Informed by the patient she has taken her last dose of Losartan and is having difficulty obtaining from the pharmacy . Collaboration with the patients primary provider requesting a new [facility] be [facility] . Collaboration with embedded PharmD regarding patient progression of medication adherence goal  Interventions: Call completed with patient on 03/11/19 . Comprehensive medication review performed.   . Reviewed & discussed the following diabetes-related information with patient: o Continue checking blood sugars as directed o Follow ADA recommended "diabetes-friendly" diet  (reviewed healthy snack/food options) o Confirmed current DM regimen: Ozempic 0.5mg  weekly, Actos Plus.  Will continue to optimize DM regimen.  Patient wishes to continue on current regimen.  We are hoping to optimize diet & exercise and potentially taper off of actos-->continue Ozempic and Metformin. o Discussed GLP-1 injection technique; Patient uses AccuCheck glucometer o Reviewed medication purpose/side effects-->patient denies adverse events, denies hypoglycemia, reports FBG<150, most recently in the upper 110s. o Most recent A1c increased from 6.5% -->7.6%.  Patient states diet was likely a contributor.  She was also unable to afford Ozempic and had to go a few weeks without it.  She has now been supplied with Ozempic until the end of the year. o Continue taking all medications as prescribed by provider . Patient continues on statin, ASA, ARB as appropriate.  Statin last filled for 90-day supply(patient verbalizes 100% compliance/denies side effects.  Will follow up to address refills.  Patient Self Care Activities:  . Self administers medications as prescribed . Attends all scheduled provider appointments . Calls pharmacy for medication  refills  Please see past updates related to this goal by clicking on the "Past Updates" button in the selected goal          Follow Up Plan: The care management team will continue to follow   Daneen Schick, BSW, CDP Social Worker, Certified Dementia Practitioner [place] [facility] [place] [facility] [phone]

## 2019-04-18 NOTE — Chronic Care Management (AMB) (Signed)
Chronic Care Management   Social Work Follow Up Note  04/18/2019 Name: Kathy Maxwell MRN: [id number] DOB: October 19, 1955  Kathy Maxwell is a 63 y.o. year old female who is a primary care patient of [name], [place]. The CCM team was consulted for assistance with chronic care management and care coordination.   Review of patient status, including review of consultants reports, other relevant assessments, and collaboration with appropriate care team members and the patient's provider was performed as part of comprehensive patient evaluation and provision of chronic care management services.    I placed a follow up call to the patient to assess for progression of patient stated goals.    SDOH (Social Determinants of Health) screening performed today: None. See Care Plan for related entries.   Outpatient Encounter Medications as of 04/18/2019  Medication Sig Note  . amLODipine (NORVASC) 10 MG tablet TAKE 1 TABLET BY MOUTH EVERY DAY   . anastrozole (ARIMIDEX) 1 MG tablet Take 1 tablet (1 mg total) by mouth daily.   [name] buPROPion (WELLBUTRIN SR) 150 MG 12 hr tablet Take 1 tablet (150 mg total) by mouth daily.   . cholecalciferol (VITAMIN D3) 25 MCG (1000 UT) tablet Take 1 tablet (1,000 Units total) by mouth daily.   [name] gabapentin (NEURONTIN) 100 MG capsule TAKE 1 CAPSULE BY MOUTH 3 TIMES A DAY   . hydrALAZINE (APRESOLINE) 25 MG tablet Take 25 mg by mouth 3 (three) times daily.    [name] lidocaine-prilocaine (EMLA) cream Apply topically as needed. Apply to port-a-cath one - two hours before accessing. (Patient not taking: Reported on 03/07/2019)   . losartan (COZAAR) 100 MG tablet TAKE 1 TABLET BY MOUTH EVERY DAY   . metoprolol succinate (TOPROL-XL) 25 MG 24 hr tablet TAKE 1 TABLET BY MOUTH EVERY DAY   . naproxen (NAPROSYN) 500 MG tablet TAKE 1 TABLET BY MOUTH TWICE A DAY AS NEEDED   . ONETOUCH VERIO test strip CHECK BLOOD SUGAR TWICE A DAY BEFORE BREAKFAST AND DINNER   . pantoprazole (PROTONIX) 40 MG  tablet Take 1 tablet (40 mg total) by mouth daily.   . pioglitazone-metformin (ACTOPLUS MET) 15-850 MG tablet TAKE 1 TABLET BY MOUTH EVERY DAY   . rosuvastatin (CRESTOR) 20 MG tablet Take 1 tablet (20 mg total) by mouth daily.   . Semaglutide,0.25 or 0.5MG/DOS, (OZEMPIC, 0.25 OR 0.5 MG/DOSE,) 2 MG/1.5ML SOPN Inject 0.5 mg into the skin once a week. 11/14/2018: Injects on Fridays   Facility-Administered Encounter Medications as of 04/18/2019  Medication  . sodium chloride 0.9 % injection 10 mL     Goals Addressed            This Visit's Progress     Patient Stated   . 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 Impaired Physical Mobility . Poor gait/balance . Inability to exercise secondary to Right hip pain  Nurse Case Manager Clinical Goal(s):  [name] Over the next 60 days, patient will work with the Orthopedic MD to address needs related to treatment management for right hip pain  CCM SW Interventions Completed 04/18/2019 . Outbound call placed to the patient to assist with chronic care management and care coordination . Confirmed the patients rollator was received from DME company. Unfortunately, the patient is currently in [facility] and the rollator was delivered to her [place] address "  I go between [place] because my nephew is also sick" . Assessed for patients ability to obtain equipment . Determined the patient has a scheduled provider appointment with Oncology on 04/19/2019 in [place] and plans to obtain equipment at that time . Collaboration with RN Case Manager regarding patient progression of current goal   Patient Self Care Activities:  . Self administers medications as prescribed . Attends all scheduled provider appointments . Calls pharmacy for medication refills . Attends church or other social activities .  Performs ADL's independently . Performs IADL's independently . Calls provider office for new concerns or questions  Please see past updates related to this goal by clicking on the "Past Updates" button in the selected goal      . I would like to improve my medication adherence (pt-stated)       Current Barriers:  . Non Adherence to prescribed medication regimen--dispense report does not match with prescribed regimen.  Will continue to assess compliance.  Pharmacist Clinical Goal(s):  [name] Over the next 90 days, patient will demonstrate Improved medication adherence as evidenced by on time refills . Over the next 90 days, patient will work with PharmD and PCP to optimize medication management of chronic conditions.  CCM SW Interventions Completed 04/18/2019 . Outbound call placed to the patient to assist with care coordination of chronic care management . Reviewed patients current medication regimen . Determined the patient is consistent with medications stating "I take my medicine every day and my needle once a week" . Performed chart review to determined the patients moss recent A1C (03/07/2019) was 7.6 . Discussed DM diet compliance and patients current CBG readings . Determined the patient feels her DM has improved as she used to have fasting sugar readings close to 200 and the average is now at 130 each morning . Encouraged the patient to continue following care team recommendations and participating in a diabetes friendly diet . Informed by the patient she has taken her last dose of Losartan and is having difficulty obtaining from the pharmacy . Collaboration with the patients primary provider requesting a new [facility] . Collaboration with embedded PharmD regarding patient progression of medication adherence goal  Patient Self Care Activities:  . Self administers medications as prescribed . Attends all scheduled provider  appointments . Calls pharmacy for medication refills  Please see past updates related to this goal by clicking on the "Past Updates" button in the selected goal          Follow Up Plan: The care management team will continue to follow the patient to assist with management of chronic conditions.   Daneen Schick, BSW, CDP Social Worker, Certified Dementia Practitioner [place] [facility] [facility] [phone]  Total time spent performing care coordination and/or care management activities with the patient by phone or face to face = 15 minutes.

## 2019-04-19 ENCOUNTER — Other Ambulatory Visit: Payer: Self-pay

## 2019-04-19 ENCOUNTER — Inpatient Hospital Stay: Payer: Medicare Other | Attending: Oncology

## 2019-04-19 ENCOUNTER — Inpatient Hospital Stay: Payer: Medicare Other

## 2019-04-19 DIAGNOSIS — C773 Secondary and unspecified malignant neoplasm of axilla and upper limb lymph nodes: Secondary | ICD-10-CM | POA: Insufficient documentation

## 2019-04-19 DIAGNOSIS — Z95828 Presence of other vascular implants and grafts: Secondary | ICD-10-CM

## 2019-04-19 DIAGNOSIS — Z452 Encounter for adjustment and management of vascular access device: Secondary | ICD-10-CM | POA: Diagnosis not present

## 2019-04-19 DIAGNOSIS — C50412 Malignant neoplasm of upper-outer quadrant of left female breast: Secondary | ICD-10-CM

## 2019-04-19 DIAGNOSIS — D0512 Intraductal carcinoma in situ of left breast: Secondary | ICD-10-CM | POA: Insufficient documentation

## 2019-04-19 MED ORDER — SODIUM CHLORIDE 0.9% FLUSH
10.0000 mL | Freq: Once | INTRAVENOUS | Status: AC
  Administered 2019-04-19: 10 mL
  Filled 2019-04-19: qty 10

## 2019-04-19 MED ORDER — HEPARIN SOD (PORK) LOCK FLUSH 100 UNIT/ML IV SOLN
500.0000 [IU] | Freq: Once | INTRAVENOUS | Status: AC
  Administered 2019-04-19: 500 [IU]
  Filled 2019-04-19: qty 5

## 2019-04-22 ENCOUNTER — Ambulatory Visit: Payer: Self-pay

## 2019-04-22 DIAGNOSIS — E1140 Type 2 diabetes mellitus with diabetic neuropathy, unspecified: Secondary | ICD-10-CM

## 2019-04-22 NOTE — Patient Instructions (Signed)
Social Worker Visit Information  Goals we discussed today:  Goals Addressed            This Visit's Progress     Patient Stated   . "I don't know which Medicare plan to choose" (pt-stated)       Current Barriers:  . Limited knowledge of specific Medicare plans  . Lacks understanding of how switching a plan may/may not effect patient assistance program in relation to Ozempic costs  Clinical Social Work Clinical Goal(s):  [name] Over the next 10 days the patient will follow up with local [facility] Counselor as directed by SW to obtain more understanding of specific payor plans . Over the next 20 days the patient will work with CM team to gain more knowledge of how patient assistance programs work in relation to [employer] . Over the next 45 days the patient will have a better understanding of suggested plans as evidenced by knowledge surrounding monthly premiums and co-pay costs  CCM SW Interventions: Completed 04/22/2019 . Inbound call from the patient to request assistance in understanding Medicare advantage plans . Determined the patient has been contacted by "[employer]" whom provided information to the patient regarding the difference in Medicare Advantage Plans versus Supplement Plans "they told me the best plan for me is [facility] plan but I don't know if I should switch" . Advised the patient that due to open enrollment she may receive several calls and mailings regarding payor plan selection . Educated the patient on local [employer] [place] Counseling services which provide unbiased Medicare plan information at no cost . Provided the patient with the contact number to [employer] . Encouraged the patient to contact a local [place] counselor to discuss patient health needs and to determine which plan is best suited for her . Advised the patient to request information regarding monthly premiums, co-pay amounts, and  in-network vs. Out-of-network costs.  . Informed by the patient she would like to switch health plans if there is another plan to meet her needs at a lower cost. However, the patient is concerned switching plans may effect patient assistance for Ozempic . Advised the patient this SW was not aware whether patient assistance programs are determined by payor plan. SW to collaborate with embedded PharmD regarding patient question . Collaboration with PharmD Lottie Dawson to request clarification regarding Ozempic patient assistance in relation to payor plan . Scheduled follow up call to the patient over the next week to review goal progression  Patient Self Care Activities:  . Calls pharmacy for medication refills . Performs ADL's independently . Calls provider office for new concerns or questions  Initial goal documentation         Materials Provided: Verbal education about [facility] Counseling provided by phone  Follow Up Plan: SW will follow up with patient by phone over the next week.  Daneen Schick, BSW, CDP Social Worker, Certified Dementia Practitioner [place] [facility] [place] [facility] [phone]

## 2019-04-22 NOTE — Chronic Care Management (AMB) (Signed)
Chronic Care Management   Social Work Follow Up Note  04/22/2019 Name: Kathy Maxwell MRN: [id number] DOB: 04-29-1956  Kathy Maxwell is a 63 y.o. year old female who is a primary care patient of [name], [place]. The CCM team was consulted for assistance with [employer] .   Review of patient status, including review of consultants reports, other relevant assessments, and collaboration with appropriate care team members and the patient's provider was performed as part of comprehensive patient evaluation and provision of chronic care management services.    Inbound call received from the patient to request SW help with Medicare open enrollment. See care plan below for interventions during today's call.  Outpatient Encounter Medications as of 04/22/2019  Medication Sig Note  . amLODipine (NORVASC) 10 MG tablet TAKE 1 TABLET BY MOUTH EVERY DAY   . anastrozole (ARIMIDEX) 1 MG tablet Take 1 tablet (1 mg total) by mouth daily.   [name] buPROPion (WELLBUTRIN SR) 150 MG 12 hr tablet Take 1 tablet (150 mg total) by mouth daily.   . cholecalciferol (VITAMIN D3) 25 MCG (1000 UT) tablet Take 1 tablet (1,000 Units total) by mouth daily.   [name] gabapentin (NEURONTIN) 100 MG capsule TAKE 1 CAPSULE BY MOUTH 3 TIMES A DAY   . hydrALAZINE (APRESOLINE) 25 MG tablet Take 25 mg by mouth 3 (three) times daily.    [name] lidocaine-prilocaine (EMLA) cream Apply topically as needed. Apply to port-a-cath one - two hours before accessing. (Patient not taking: Reported on 03/07/2019)   . losartan (COZAAR) 100 MG tablet Take 1 tablet (100 mg total) by mouth daily.   . metoprolol succinate (TOPROL-XL) 25 MG 24 hr tablet TAKE 1 TABLET BY MOUTH EVERY DAY   . naproxen (NAPROSYN) 500 MG tablet TAKE 1 TABLET BY MOUTH TWICE A DAY AS NEEDED   . ONETOUCH VERIO test strip CHECK BLOOD SUGAR TWICE A DAY BEFORE BREAKFAST AND DINNER   . pantoprazole (PROTONIX) 40 MG tablet Take 1 tablet (40 mg total) by mouth daily.   .  pioglitazone-metformin (ACTOPLUS MET) 15-850 MG tablet TAKE 1 TABLET BY MOUTH EVERY DAY   . rosuvastatin (CRESTOR) 20 MG tablet Take 1 tablet (20 mg total) by mouth daily.   . Semaglutide,0.25 or 0.5MG/DOS, (OZEMPIC, 0.25 OR 0.5 MG/DOSE,) 2 MG/1.5ML SOPN Inject 0.5 mg into the skin once a week. 11/14/2018: Injects on Fridays   Facility-Administered Encounter Medications as of 04/22/2019  Medication  . sodium chloride 0.9 % injection 10 mL     Goals Addressed            This Visit's Progress     Patient Stated   . "I don't know which Medicare plan to choose" (pt-stated)       Current Barriers:  . Limited knowledge of specific Medicare plans  . Lacks understanding of how switching a plan may/may not effect patient assistance program in relation to Ozempic costs  Clinical Social Work Clinical Goal(s):  [name] Over the next 10 days the patient will follow up with local [facility] Counselor as directed by SW to obtain more understanding of specific payor plans . Over the next 20 days the patient will work with CM team to gain more knowledge of how patient assistance programs work in relation to [employer] . Over the next 45 days the patient will have a better understanding of suggested plans as evidenced by knowledge surrounding monthly premiums and co-pay costs  CCM SW Interventions: Completed 04/22/2019 . Inbound call from the  patient to request assistance in understanding Medicare advantage plans . Determined the patient has been contacted by "[employer]" whom provided information to the patient regarding the difference in Medicare Advantage Plans versus Supplement Plans "they told me the best plan for me is [facility] G plan but I don't know if I should switch" . Advised the patient that due to open enrollment she may receive several calls and mailings regarding payor plan selection . Educated the patient on local [employer] Counseling services which  provide unbiased Medicare plan information at no cost . Provided the patient with the contact number to [employer] . Encouraged the patient to contact a local [place] counselor to discuss patient health needs and to determine which plan is best suited for her . Advised the patient to request information regarding monthly premiums, co-pay amounts, and in-network vs. Out-of-network costs.  . Informed by the patient she would like to switch health plans if there is another plan to meet her needs at a lower cost. However, the patient is concerned switching plans may effect patient assistance for Ozempic . Advised the patient this SW was not aware whether patient assistance programs are determined by payor plan. SW to collaborate with embedded PharmD regarding patient question . Collaboration with PharmD Lottie Dawson to request clarification regarding Ozempic patient assistance in relation to payor plan . Scheduled follow up call to the patient over the next week to review goal progression  Patient Self Care Activities:  . Calls pharmacy for medication refills . Performs ADL's independently . Calls provider office for new concerns or questions  Initial goal documentation         Follow Up Plan: SW will follow up with patient by phone over the next week.   Daneen Schick, BSW, CDP Social Worker, Certified Dementia Practitioner [place] [facility] [place] [facility] [phone]  Total time spent performing care coordination and/or care management activities with the patient by phone or face to face = 15 minutes.

## 2019-04-23 ENCOUNTER — Telehealth: Payer: Self-pay

## 2019-04-25 ENCOUNTER — Ambulatory Visit (INDEPENDENT_AMBULATORY_CARE_PROVIDER_SITE_OTHER): Payer: Medicare Other | Admitting: Pharmacist

## 2019-04-25 ENCOUNTER — Other Ambulatory Visit: Payer: Self-pay

## 2019-04-25 DIAGNOSIS — E1140 Type 2 diabetes mellitus with diabetic neuropathy, unspecified: Secondary | ICD-10-CM

## 2019-04-25 DIAGNOSIS — I10 Essential (primary) hypertension: Secondary | ICD-10-CM | POA: Diagnosis not present

## 2019-04-25 MED ORDER — HYDRALAZINE HCL 25 MG PO TABS: 25.0000 mg | ORAL_TABLET | Freq: Three times a day (TID) | ORAL | 3 refills | Status: DC

## 2019-04-25 NOTE — Progress Notes (Signed)
Chronic Care Management   Visit Note  04/25/2019 Name: Kathy Maxwell MRN: [id number] DOB: Aug 12, 1955  Referred by: Kathy Brine, FNP Reason for referral : Chronic Care Management   Kathy Maxwell is a 63 y.o. year old female who is a primary care patient of Kathy Maxwell, [place]. The CCM team was consulted for assistance with chronic disease management and care coordination needs related to HTN DMII  Review of patient status, including review of consultants reports, relevant laboratory and other test results, and collaboration with appropriate care team members and the patient's provider was performed as part of comprehensive patient evaluation and provision of chronic care management services.    I spoke with [name] by telephone today.  Advanced Directives Status: N See Care Plan and Vynca application for related entries.   Medications: Outpatient Encounter Medications as of 04/25/2019  Medication Sig Note  . amLODipine (NORVASC) 10 MG tablet TAKE 1 TABLET BY MOUTH EVERY DAY   . anastrozole (ARIMIDEX) 1 MG tablet Take 1 tablet (1 mg total) by mouth daily.   [name] buPROPion (WELLBUTRIN SR) 150 MG 12 hr tablet Take 1 tablet (150 mg total) by mouth daily.   . cholecalciferol (VITAMIN D3) 25 MCG (1000 UT) tablet Take 1 tablet (1,000 Units total) by mouth daily.   [name] gabapentin (NEURONTIN) 100 MG capsule TAKE 1 CAPSULE BY MOUTH 3 TIMES A DAY   . lidocaine-prilocaine (EMLA) cream Apply topically as needed. Apply to port-a-cath one - two hours before accessing. (Patient not taking: Reported on 03/07/2019)   . losartan (COZAAR) 100 MG tablet Take 1 tablet (100 mg total) by mouth daily.   . metoprolol succinate (TOPROL-XL) 25 MG 24 hr tablet TAKE 1 TABLET BY MOUTH EVERY DAY   . naproxen (NAPROSYN) 500 MG tablet TAKE 1 TABLET BY MOUTH TWICE A DAY AS NEEDED   . ONETOUCH VERIO test strip CHECK BLOOD SUGAR TWICE A DAY BEFORE BREAKFAST AND DINNER   . pantoprazole (PROTONIX) 40 MG tablet Take 1 tablet  (40 mg total) by mouth daily.   . pioglitazone-metformin (ACTOPLUS MET) 15-850 MG tablet TAKE 1 TABLET BY MOUTH EVERY DAY   . rosuvastatin (CRESTOR) 20 MG tablet Take 1 tablet (20 mg total) by mouth daily.   . Semaglutide,0.25 or 0.5MG/DOS, (OZEMPIC, 0.25 OR 0.5 MG/DOSE,) 2 MG/1.5ML SOPN Inject 0.5 mg into the skin once a week. 11/14/2018: Injects on Fridays  . [DISCONTINUED] hydrALAZINE (APRESOLINE) 25 MG tablet Take 25 mg by mouth 3 (three) times daily.     Facility-Administered Encounter Medications as of 04/25/2019  Medication  . sodium chloride 0.9 % injection 10 mL     Objective:   Goals Addressed            This Visit's Progress     Patient Stated   . I would like to improve my medication adherence (pt-stated)       Current Barriers:  . Non Adherence to prescribed medication regimen--dispense report does not match with prescribed regimen.  Will continue to assess compliance.  Pharmacist Clinical Goal(s):  [name] Over the next 90 days, patient will demonstrate Improved medication adherence as evidenced by on time refills . Over the next 90 days, patient will work with PharmD and PCP to optimize medication management of chronic conditions.  CCM SW Interventions Completed 04/18/2019 . Outbound call placed to the patient to assist with care coordination of chronic care management . Reviewed patients current medication regimen . Determined the patient is consistent with medications stating "  I take my medicine every day and my needle once a week" . Performed chart review to determined the patients moss recent A1C (03/07/2019) was 7.6 . Discussed DM diet compliance and patients current CBG readings . Determined the patient feels her DM has improved as she used to have fasting sugar readings close to 200 and the average is now at 130 each morning . Encouraged the patient to continue following care team recommendations and participating in a diabetes friendly diet . Informed by the patient  she has taken her last dose of Losartan and is having difficulty obtaining from the pharmacy . Collaboration with the patients primary provider requesting a new Rx be [facility] . Collaboration with embedded PharmD regarding patient progression of medication adherence goal  Interventions: Call completed with patient on 04/25/19 . Comprehensive medication review performed.   . Reviewed & discussed the following diabetes-related information with patient: o Continue checking blood sugars as directed o Follow ADA recommended "diabetes-friendly" diet  (reviewed healthy snack/food options) o Confirmed current DM regimen: Ozempic 0.51m weekly, Actos Plus.  Will continue to optimize DM regimen.  Patient wishes to continue on current regimen.  We are hoping to optimize diet & exercise and potentially taper off of actos-->continue Ozempic and Metformin. o Discussed GLP-1 injection technique; Patient uses AccuCheck glucometer o Reviewed medication purpose/side effects-->patient denies adverse events, denies hypoglycemia, reports FBG<150, most recently in the upper 110s. o Most recent A1c increased from 6.5% -->7.6%.  Patient states diet was likely a contributor.  She was also unable to afford Ozempic and had to go a few weeks without it.  She has now been supplied with Ozempic until the end of the year. Recheck A1c next month. o Continue taking all medications as prescribed by provider . Patient continues on statin, ASA, ARB as appropriate.  Statin last filled for 90-day supply(patient verbalizes 100% compliance/denies side effects.  Will follow up to address refills. . All blood pressure medications called in (amlodipine, hydralazine, metoprolol, losartan).  Encouraged patient to monitor BP since restarting new medications.  Goal BP <130/80 . Bupropion refill also called in-->$8  Patient Self Care Activities:  . Self administers medications as prescribed .  Attends all scheduled provider appointments . Calls pharmacy for medication refills  Please see past updates related to this goal by clicking on the "Past Updates" button in the selected goal           Plan:   The care management team will reach out to the patient again over the next 2-4 weeks.  JRegina Eck PharmD, BCPS Clinical Pharmacist, [facility] [phone]

## 2019-04-25 NOTE — Patient Instructions (Signed)
Visit Information  Goals Addressed            This Visit's Progress     Patient Stated   . I would like to improve my medication adherence (pt-stated)       Current Barriers:  . Non Adherence to prescribed medication regimen--dispense report does not match with prescribed regimen.  Will continue to assess compliance.  Pharmacist Clinical Goal(s):  [name] Over the next 90 days, patient will demonstrate Improved medication adherence as evidenced by on time refills . Over the next 90 days, patient will work with PharmD and PCP to optimize medication management of chronic conditions.  CCM SW Interventions Completed 04/18/2019 . Outbound call placed to the patient to assist with care coordination of chronic care management . Reviewed patients current medication regimen . Determined the patient is consistent with medications stating "I take my medicine every day and my needle once a week" . Performed chart review to determined the patients moss recent A1C (03/07/2019) was 7.6 . Discussed DM diet compliance and patients current CBG readings . Determined the patient feels her DM has improved as she used to have fasting sugar readings close to 200 and the average is now at 130 each morning . Encouraged the patient to continue following care team recommendations and participating in a diabetes friendly diet . Informed by the patient she has taken her last dose of Losartan and is having difficulty obtaining from the pharmacy . Collaboration with the patients primary provider requesting a new [facility] . Collaboration with embedded PharmD regarding patient progression of medication adherence goal  Interventions: Call completed with patient on 04/25/19 . Comprehensive medication review performed.   . Reviewed & discussed the following diabetes-related information with patient: o Continue checking blood sugars as directed o Follow ADA recommended  "diabetes-friendly" diet  (reviewed healthy snack/food options) o Confirmed current DM regimen: Ozempic 0.5mg  weekly, Actos Plus.  Will continue to optimize DM regimen.  Patient wishes to continue on current regimen.  We are hoping to optimize diet & exercise and potentially taper off of actos-->continue Ozempic and Metformin. o Discussed GLP-1 injection technique; Patient uses AccuCheck glucometer o Reviewed medication purpose/side effects-->patient denies adverse events, denies hypoglycemia, reports FBG<150, most recently in the upper 110s. o Most recent A1c increased from 6.5% -->7.6%.  Patient states diet was likely a contributor.  She was also unable to afford Ozempic and had to go a few weeks without it.  She has now been supplied with Ozempic until the end of the year. Recheck A1c next month. o Continue taking all medications as prescribed by provider . Patient continues on statin, ASA, ARB as appropriate.  Statin last filled for 90-day supply(patient verbalizes 100% compliance/denies side effects.  Will follow up to address refills. . All blood pressure medications called in (amlodipine, hydralazine, metoprolol, losartan).  Encouraged patient to monitor BP since restarting new medications.  Goal BP <130/80 . Bupropion refill also called in-->$8  Patient Self Care Activities:  . Self administers medications as prescribed . Attends all scheduled provider appointments . Calls pharmacy for medication refills  Please see past updates related to this goal by clicking on the "Past Updates" button in the selected goal         The patient verbalized understanding of instructions provided today and declined a print copy of patient instruction materials.   The care management team will reach out to the patient again over the  next 4-6 weeks.  Regina Eck, PharmD, BCPS Clinical Pharmacist, [facility] [place]:  [phone]

## 2019-04-26 ENCOUNTER — Other Ambulatory Visit: Payer: Self-pay | Admitting: Nurse Practitioner

## 2019-04-26 ENCOUNTER — Ambulatory Visit: Payer: Self-pay

## 2019-04-26 DIAGNOSIS — I10 Essential (primary) hypertension: Secondary | ICD-10-CM | POA: Diagnosis not present

## 2019-04-26 DIAGNOSIS — F319 Bipolar disorder, unspecified: Secondary | ICD-10-CM

## 2019-04-26 DIAGNOSIS — E1140 Type 2 diabetes mellitus with diabetic neuropathy, unspecified: Secondary | ICD-10-CM | POA: Diagnosis not present

## 2019-04-26 DIAGNOSIS — C50412 Malignant neoplasm of upper-outer quadrant of left female breast: Secondary | ICD-10-CM

## 2019-04-26 NOTE — Patient Instructions (Signed)
Visit Information  Goals Addressed      Patient Stated   . "I don't know which Medicare plan to choose" (pt-stated)       Current Barriers:  . Limited knowledge of specific Medicare plans  . Lacks understanding of how switching a plan may/may not effect patient assistance program in relation to Ozempic costs  Clinical Social Work Clinical Goal(s):  [name] Over the next 10 days the patient will follow up with local [facility] Counselor as directed by SW to obtain more understanding of specific payor plans . Over the next 20 days the patient will work with CM team to gain more knowledge of how patient assistance programs work in relation to [employer] . Over the next 45 days the patient will have a better understanding of suggested plans as evidenced by knowledge surrounding monthly premiums and co-pay costs  CCM SW Interventions: Completed 04/22/2019 . Inbound call from the patient to request assistance in understanding Medicare advantage plans . Determined the patient has been contacted by "[employer]" whom provided information to the patient regarding the difference in Medicare Advantage Plans versus Supplement Plans "they told me the best plan for me is [facility] G plan but I don't know if I should switch" . Advised the patient that due to open enrollment she may receive several calls and mailings regarding payor plan selection . Educated the patient on local [employer] ([place] Counseling services which provide unbiased Medicare plan information at no cost . Provided the patient with the contact number to [employer] . Encouraged the patient to contact a local [place] counselor to discuss patient health needs and to determine which plan is best suited for her . Advised the patient to request information regarding monthly premiums, co-pay amounts, and in-network vs. Out-of-network costs.  . Informed by the patient she would like to  switch health plans if there is another plan to meet her needs at a lower cost. However, the patient is concerned switching plans may effect patient assistance for Ozempic . Advised the patient this SW was not aware whether patient assistance programs are determined by payor plan. SW to collaborate with embedded PharmD regarding patient question . Collaboration with PharmD Lottie Dawson to request clarification regarding Ozempic patient assistance in relation to payor plan . Scheduled follow up call to the patient over the next week to review goal progression  CCM RN CM Interventions: 04/26/19: call completed with patient  . Received a voice message from patient requesting a call back  . Placed an outbound call to patient; discussed she is still uncertain about which managed Medicare plan is most appropriate for her needs . Answered patients questions accordingly; discussed BSW Daneen Schick will be able to offer additional information and will f/u with her by telephone in about a week - patient verbalizes understanding and is appreciative of the call . Sent in basket message to [facility] [place] requesting she follow up with patient when possible to answer additional questions related to open enrollment and managed Medicare plans to choose from  . Discussed plans with patient for ongoing care management follow up and provided patient with direct contact information for care management team  Patient Self Care Activities:  . Calls pharmacy for medication refills . Performs ADL's independently . Calls provider office for new concerns or questions  Please see past updates related to this goal by clicking on the "Past Updates" button in the selected goal  The patient verbalized understanding of instructions provided today and declined a print copy of patient instruction materials.   Telephone follow up appointment with care management team member scheduled for: 06/10/19  Barb Merino, RN,  BSN, CCM Care Management Coordinator [place] [facility]  Direct Phone: [phone]

## 2019-04-26 NOTE — Chronic Care Management (AMB) (Signed)
Chronic Care Management   Follow Up Note   04/26/2019 Name: Kathy Maxwell MRN: [id number] DOB: 1956-06-21  Referred by: [name], FNP Reason for referral : Chronic Care Management (CCM RNCM Telephone Follow up )   Kathy Maxwell is a 63 y.o. year old female who is a primary care patient of [name], [place]. The CCM team was consulted for assistance with chronic disease management and care coordination needs.    Review of patient status, including review of consultants reports, relevant laboratory and other test results, and collaboration with appropriate care team members and the patient's provider was performed as part of comprehensive patient evaluation and provision of chronic care management services.    SDOH (Social Determinants of Health) screening performed today: None. See Care Plan for related entries.   Advanced Directives Status: N See Care Plan and Vynca application for related entries.  I spoke with Kathy Maxwell by telephone today in response to a voice message she left requesting a call back.   Outpatient Encounter Medications as of 04/26/2019  Medication Sig Note  . amLODipine (NORVASC) 10 MG tablet TAKE 1 TABLET BY MOUTH EVERY DAY   . anastrozole (ARIMIDEX) 1 MG tablet Take 1 tablet (1 mg total) by mouth daily.   [name] buPROPion (WELLBUTRIN SR) 150 MG 12 hr tablet Take 1 tablet (150 mg total) by mouth daily.   . cholecalciferol (VITAMIN D3) 25 MCG (1000 UT) tablet Take 1 tablet (1,000 Units total) by mouth daily.   [name] gabapentin (NEURONTIN) 100 MG capsule TAKE 1 CAPSULE BY MOUTH 3 TIMES A DAY   . hydrALAZINE (APRESOLINE) 25 MG tablet Take 1 tablet (25 mg total) by mouth 3 (three) times daily.   [name] lidocaine-prilocaine (EMLA) cream Apply topically as needed. Apply to port-a-cath one - two hours before accessing. (Patient not taking: Reported on 03/07/2019)   . losartan (COZAAR) 100 MG tablet Take 1 tablet (100 mg total) by mouth daily.   . metoprolol succinate (TOPROL-XL)  25 MG 24 hr tablet TAKE 1 TABLET BY MOUTH EVERY DAY   . naproxen (NAPROSYN) 500 MG tablet TAKE 1 TABLET BY MOUTH TWICE A DAY AS NEEDED   . ONETOUCH VERIO test strip CHECK BLOOD SUGAR TWICE A DAY BEFORE BREAKFAST AND DINNER   . pantoprazole (PROTONIX) 40 MG tablet Take 1 tablet (40 mg total) by mouth daily.   . pioglitazone-metformin (ACTOPLUS MET) 15-850 MG tablet TAKE 1 TABLET BY MOUTH EVERY DAY   . rosuvastatin (CRESTOR) 20 MG tablet Take 1 tablet (20 mg total) by mouth daily.   . Semaglutide,0.25 or 0.'5MG'$ /DOS, (OZEMPIC, 0.25 OR 0.5 MG/DOSE,) 2 MG/1.5ML SOPN Inject 0.5 mg into the skin once a week. 11/14/2018: Injects on Fridays   Facility-Administered Encounter Medications as of 04/26/2019  Medication  . sodium chloride 0.9 % injection 10 mL     Goals Addressed      Patient Stated   . "I don't know which Medicare plan to choose" (pt-stated)       Current Barriers:  . Limited knowledge of specific Medicare plans  . Lacks understanding of how switching a plan may/may not effect patient assistance program in relation to Ozempic costs  Clinical Social Work Clinical Goal(s):  [name] Over the next 10 days the patient will follow up with local [facility] Counselor as directed by SW to obtain more understanding of specific payor plans . Over the next 20 days the patient will work with CM team to gain more knowledge of how patient  assistance programs work in relation to [employer] . Over the next 45 days the patient will have a better understanding of suggested plans as evidenced by knowledge surrounding monthly premiums and co-pay costs  CCM SW Interventions: Completed 04/22/2019 . Inbound call from the patient to request assistance in understanding Medicare advantage plans . Determined the patient has been contacted by "[employer]" whom provided information to the patient regarding the difference in Medicare Advantage Plans versus Supplement Plans "they told me the best plan for me is [facility] [facility] G plan  but I don't know if I should switch" . Advised the patient that due to open enrollment she may receive several calls and mailings regarding payor plan selection . Educated the patient on local [employer] [employer] Counseling services which provide unbiased Medicare plan information at no cost . Provided the patient with the contact number to [employer] . Encouraged the patient to contact a local [place] counselor to discuss patient health needs and to determine which plan is best suited for her . Advised the patient to request information regarding monthly premiums, co-pay amounts, and in-network vs. Out-of-network costs.  . Informed by the patient she would like to switch health plans if there is another plan to meet her needs at a lower cost. However, the patient is concerned switching plans may effect patient assistance for Ozempic . Advised the patient this SW was not aware whether patient assistance programs are determined by payor plan. SW to collaborate with embedded PharmD regarding patient question . Collaboration with PharmD Lottie Dawson to request clarification regarding Ozempic patient assistance in relation to payor plan . Scheduled follow up call to the patient over the next week to review goal progression  CCM RN CM Interventions: 04/26/19: call completed with patient  . Received a voice message from patient requesting a call back  . Placed an outbound call to patient; discussed she is still uncertain about which managed Medicare plan is most appropriate for her needs . Answered patients questions accordingly; discussed BSW Daneen Schick will be able to offer additional information and will f/u with her by telephone in about a week - patient verbalizes understanding and is appreciative of the call . Sent in basket message to [facility] requesting she follow up with patient when possible to answer additional questions  related to open enrollment and managed Medicare plans to choose from  . Discussed plans with patient for ongoing care management follow up and provided patient with direct contact information for care management team  Patient Self Care Activities:  . Calls pharmacy for medication refills . Performs ADL's independently . Calls provider office for new concerns or questions  Please see past updates related to this goal by clicking on the "Past Updates" button in the selected goal          Telephone follow up appointment with care management team member scheduled for: 06/10/19   Barb Merino, RN, BSN, CCM Care Management Coordinator [place] [facility]  Direct Phone: [phone]

## 2019-04-30 ENCOUNTER — Ambulatory Visit: Payer: Self-pay

## 2019-04-30 DIAGNOSIS — E1140 Type 2 diabetes mellitus with diabetic neuropathy, unspecified: Secondary | ICD-10-CM

## 2019-04-30 NOTE — Chronic Care Management (AMB) (Signed)
Chronic Care Management   Social Work Follow Up Note  04/30/2019 Name: Kathy Maxwell MRN: [id number] DOB: November 14, 1955  Kathy Maxwell is a 63 y.o. year old female who is a primary care patient of [name], [place]. The CCM team was consulted for assistance with care coordination and disease management.  Review of patient status, including review of consultants reports, other relevant assessments, and collaboration with appropriate care team members and the patient's provider was performed as part of comprehensive patient evaluation and provision of chronic care management services.    SW placed a successful outbound call to the patient to assess progression of patient stated goal related to open enrollment. See care plan below.  Outpatient Encounter Medications as of 04/30/2019  Medication Sig Note  . amLODipine (NORVASC) 10 MG tablet TAKE 1 TABLET BY MOUTH EVERY DAY   . anastrozole (ARIMIDEX) 1 MG tablet Take 1 tablet (1 mg total) by mouth daily.   [name] buPROPion (WELLBUTRIN SR) 150 MG 12 hr tablet TAKE 1 TABLET BY MOUTH EVERY DAY   . cholecalciferol (VITAMIN D3) 25 MCG (1000 UT) tablet Take 1 tablet (1,000 Units total) by mouth daily.   [name] gabapentin (NEURONTIN) 100 MG capsule TAKE 1 CAPSULE BY MOUTH 3 TIMES A DAY   . hydrALAZINE (APRESOLINE) 25 MG tablet Take 1 tablet (25 mg total) by mouth 3 (three) times daily.   [name] lidocaine-prilocaine (EMLA) cream Apply topically as needed. Apply to port-a-cath one - two hours before accessing. (Patient not taking: Reported on 03/07/2019)   . losartan (COZAAR) 100 MG tablet Take 1 tablet (100 mg total) by mouth daily.   . metoprolol succinate (TOPROL-XL) 25 MG 24 hr tablet TAKE 1 TABLET BY MOUTH EVERY DAY   . naproxen (NAPROSYN) 500 MG tablet TAKE 1 TABLET BY MOUTH TWICE A DAY AS NEEDED   . ONETOUCH VERIO test strip CHECK BLOOD SUGAR TWICE A DAY BEFORE BREAKFAST AND DINNER   . pantoprazole (PROTONIX) 40 MG tablet Take 1 tablet (40 mg total) by mouth daily.    . pioglitazone-metformin (ACTOPLUS MET) 15-850 MG tablet TAKE 1 TABLET BY MOUTH EVERY DAY   . rosuvastatin (CRESTOR) 20 MG tablet Take 1 tablet (20 mg total) by mouth daily.   . Semaglutide,0.25 or 0.5MG/DOS, (OZEMPIC, 0.25 OR 0.5 MG/DOSE,) 2 MG/1.5ML SOPN Inject 0.5 mg into the skin once a week. 11/14/2018: Injects on Fridays   Facility-Administered Encounter Medications as of 04/30/2019  Medication  . sodium chloride 0.9 % injection 10 mL     Goals Addressed            This Visit's Progress     Patient Stated   . COMPLETED: "I don't know which Medicare plan to choose" (pt-stated)       Current Barriers:  . Limited knowledge of specific Medicare plans  . Lacks understanding of how switching a plan may/may not effect patient assistance program in relation to Ozempic costs  Clinical Social Work Clinical Goal(s):  [name] Over the next 10 days the patient will follow up with local [facility] Counselor as directed by SW to obtain more understanding of specific payor plans Completed . Over the next 20 days the patient will work with CM team to gain more knowledge of how patient assistance programs work in relation to [employer] Completed . Over the next 45 days the patient will have a better understanding of suggested plans as evidenced by knowledge surrounding monthly premiums and co-pay costs  CCM SW Interventions: Completed 04/30/2019 .  Outbound call to the patient to assess progression of patient stated goal . Determined the patient has been in contacted with embedded PharmD surrounding questions of Ozempic patient assistance programs pending changing payor plans. The patient reports per PharmD her ability to qualify for this program is not effected by health plan coverage . Informed by the patient she has been in contact with [employer] counselor . Assessed for patient knowledge and plan regarding open enrollment. The patient reports "I think I will just stay where  I am at because I like this plan" . Encouraged the patient to contact CM team with future concerns to allow the team to assist with care coordination needs  CCM RN CM Interventions: 04/26/19: call completed with patient  . Received a voice message from patient requesting a call back  . Placed an outbound call to patient; discussed she is still uncertain about which managed Medicare plan is most appropriate for her needs . Answered patients questions accordingly; discussed BSW Daneen Schick will be able to offer additional information and will f/u with her by telephone in about a week - patient verbalizes understanding and is appreciative of the call . Sent in basket message to [facility] requesting she follow up with patient when possible to answer additional questions related to open enrollment and managed Medicare plans to choose from  . Discussed plans with patient for ongoing care management follow up and provided patient with direct contact information for care management team  Patient Self Care Activities:  . Calls pharmacy for medication refills . Performs ADL's independently . Calls provider office for new concerns or questions  Please see past updates related to this goal by clicking on the "Past Updates" button in the selected goal          Follow Up Plan: No further SW follow up planned at this time. The patient will remain engaged by RN Case Freight forwarder and PharmD.   Daneen Schick, BSW, CDP Social Worker, Certified Dementia Practitioner [facility] [place] [facility] [phone]  Total time spent performing care coordination and/or care management activities with the patient by phone or face to face = 13 minutes.

## 2019-04-30 NOTE — Patient Instructions (Signed)
Social Worker Visit Information  Goals we discussed today:  Goals Addressed            This Visit's Progress     Patient Stated   . COMPLETED: "I don't know which Medicare plan to choose" (pt-stated)       Current Barriers:  . Limited knowledge of specific Medicare plans  . Lacks understanding of how switching a plan may/may not effect patient assistance program in relation to Ozempic costs  Clinical Social Work Clinical Goal(s):  [name] Over the next 10 days the patient will follow up with local [facility] Counselor as directed by SW to obtain more understanding of specific payor plans Completed . Over the next 20 days the patient will work with CM team to gain more knowledge of how patient assistance programs work in relation to [employer] Completed . Over the next 45 days the patient will have a better understanding of suggested plans as evidenced by knowledge surrounding monthly premiums and co-pay costs  CCM SW Interventions: Completed 04/30/2019 . Outbound call to the patient to assess progression of patient stated goal . Determined the patient has been in contacted with embedded PharmD surrounding questions of Ozempic patient assistance programs pending changing payor plans. The patient reports per PharmD her ability to qualify for this program is not effected by health plan coverage . Informed by the patient she has been in contact with [employer] counselor . Assessed for patient knowledge and plan regarding open enrollment. The patient reports "I think I will just stay where I am at because I like this plan" . Encouraged the patient to contact CM team with future concerns to allow the team to assist with care coordination needs  CCM RN CM Interventions: 04/26/19: call completed with patient  . Received a voice message from patient requesting a call back  . Placed an outbound call to patient; discussed she is still uncertain about which managed Medicare plan  is most appropriate for her needs . Answered patients questions accordingly; discussed BSW Daneen Schick will be able to offer additional information and will f/u with her by telephone in about a week - patient verbalizes understanding and is appreciative of the call . Sent in basket message to [facility] requesting she follow up with patient when possible to answer additional questions related to open enrollment and managed Medicare plans to choose from  . Discussed plans with patient for ongoing care management follow up and provided patient with direct contact information for care management team  Patient Self Care Activities:  . Calls pharmacy for medication refills . Performs ADL's independently . Calls provider office for new concerns or questions  Please see past updates related to this goal by clicking on the "Past Updates" button in the selected goal         Follow Up Plan: No planned SW follow up at this time. The patient is encouraged to contact SW with future resource needs.   Daneen Schick, BSW, CDP Social Worker, Certified Dementia Practitioner [place] [facility] [place] [facility] [phone]

## 2019-05-06 ENCOUNTER — Ambulatory Visit: Payer: Medicare Other | Admitting: Neurology

## 2019-05-06 ENCOUNTER — Other Ambulatory Visit: Payer: Self-pay

## 2019-05-06 ENCOUNTER — Encounter: Payer: Self-pay | Admitting: Neurology

## 2019-05-06 ENCOUNTER — Ambulatory Visit: Payer: Medicare Other | Admitting: Nurse Practitioner

## 2019-05-06 VITALS — BP 171/81 | HR 73 | Temp 97.8°F | Ht 62.0 in | Wt 207.0 lb

## 2019-05-06 DIAGNOSIS — E1142 Type 2 diabetes mellitus with diabetic polyneuropathy: Secondary | ICD-10-CM | POA: Diagnosis not present

## 2019-05-06 DIAGNOSIS — I5032 Chronic diastolic (congestive) heart failure: Secondary | ICD-10-CM | POA: Insufficient documentation

## 2019-05-06 DIAGNOSIS — E1140 Type 2 diabetes mellitus with diabetic neuropathy, unspecified: Secondary | ICD-10-CM | POA: Diagnosis not present

## 2019-05-06 DIAGNOSIS — R0683 Snoring: Secondary | ICD-10-CM | POA: Insufficient documentation

## 2019-05-06 DIAGNOSIS — G620 Drug-induced polyneuropathy: Secondary | ICD-10-CM

## 2019-05-06 DIAGNOSIS — R351 Nocturia: Secondary | ICD-10-CM | POA: Insufficient documentation

## 2019-05-06 DIAGNOSIS — G4719 Other hypersomnia: Secondary | ICD-10-CM | POA: Diagnosis not present

## 2019-05-06 DIAGNOSIS — T451X5A Adverse effect of antineoplastic and immunosuppressive drugs, initial encounter: Secondary | ICD-10-CM | POA: Insufficient documentation

## 2019-05-06 NOTE — Progress Notes (Signed)
SLEEP MEDICINE CLINIC    Provider:  Larey Seat, MD  Primary Care Physician:  [name], [place] [address] [id number]     Referring Provider: [name], [address] [place],  [place] [address]          Chief Complaint according to patient   Patient presents with:    . New Patient (Initial Visit)     This patient has had a PSG completed several years ago, but never started CPAP due to being unable to afford at that time.       HISTORY OF PRESENT ILLNESS:  Kathy Maxwell is a 63 y.o. year old African American female patient seen  on 05/06/2019 .  I have the pleasure of seeing Kathy Maxwell today, a right -handed African American female with a possible sleep disorder.  She  has a past medical history of Allergy, Arthritis, Bipolar 1 disorder ([place]),  (04/04/2013), Blood transfusion without reported diagnosis, chronic diastolic heart failure. Carpal tunnel, surgery. Breast cancer ([place]) (03/10/13), Personal history of chemotherapy, Personal history of radiation therapy, Radiation (01/30/14-03/19/14), and Sleep apnea.Coronary artery disease, Diabetes mellitus without complication ([place]), GERD (gastroesophageal reflux disease), Heart murmur,  Hyperlipidemia (04/04/2013),  Hypertension (04/04/2013), Morbid Obesity.   The patient had the first sleep study " somewhere on [place]" before her cancer diagnosis. She was diagnosed with sleep apnea.    Sleep relevant medical history: Nocturia- up 3-4 times, Sleep walking; none.  Tonsillectomy: not sure , no cervical spine or TBI, deviated septum repair.   Family medical /sleep history:  Her sister is the other family member on CPAP with OSA. Older sister.    Social history: Patient is retired from [employer], worked day and night shifts and lives in a household with alone.  Family status is single , without children. Pets are not  present. Tobacco use: over 30 tears ago .   ETOH use:  socially. Caffeine intake in form of Coffee(1 cup in AM and after lunch) Soda( all day) Tea ( dinner , some evenings) no energy drinks. Regular exercise- none  Hobbies :none    Sleep habits are as follows: The patient's dinner time is between 7 PM. She watches TV in her den.  The patient goes to bed at 9 PM and struggles to sleep, she reads instead. Continues to sleep for hours, wakes for many bathroom breaks, the first time at 11 PM and midnight AM.   The preferred sleep position is lateral , with the support of one pillow.  Dreams are reportedly rare but vivid. The patient wakes up spontaneously at 8-10 AM without an alarm.   She reports not feeling refreshed or restored in AM, with symptoms such as dry mouth, morning headaches(!), and residual fatigue.  Naps are taken infrequently, depending on how " drained she feels" and she goes to sleep promptly - lasting from 1-2 hours and are more refreshing than nocturnal sleep.    Review of Systems: Out of a complete 14 system review, the patient complains of only the following symptoms, and all other reviewed systems are negative:   Hip arthritis, walking with a cane.  Obesity.  all day sleepy, up all night.  Fatigue, sleepiness , snoring, fragmented sleep, Insomnia - delayed onset and fragmented sleep due to nocturia.    How likely are you to doze in the following situations: 0 = not likely, 1 = slight chance, 2 = moderate chance,  3 = high chance   Sitting and Reading? 3 Watching Television?3 Sitting inactive in a public place (theater or meeting)?2 As a passenger in a car for an hour without a break?3 Lying down in the afternoon when circumstances permit?2 Sitting and talking to someone?2 Sitting quietly after lunch without alcohol?1 In a car, while stopped for a few minutes in traffic?1   Total = 17/ 24 points   FSS endorsed at 45/ 63 points.   Social History   Socioeconomic History  . Marital status: Single    Spouse name: Not  on file  . Number of children: 0  . Years of education: Not on file  . Highest education level: Not on file  Occupational History  . Occupation: DISABLED    Employer: UNEMPLOYED  Social Needs  . Financial resource strain: Hard  . Food insecurity    Worry: Never true    Inability: Never true  . Transportation needs    Medical: No    Non-medical: No  Tobacco Use  . Smoking status: Former Smoker    Packs/day: 1.00    Years: 31.00    Pack years: 31.00    Types: Cigarettes    Quit date: 07/19/1987    Years since quitting: 31.8  . Smokeless tobacco: Never Used  Substance and Sexual Activity  . Alcohol use: No    Alcohol/week: 0.0 standard drinks  . Drug use: No  . Sexual activity: Never    Birth control/protection: Surgical  Lifestyle  . Physical activity    Days per week: 0 days    Minutes per session: 0 min  . Stress: Only a little  Relationships  . Social connections    Talks on phone: More than three times a week    Gets together: More than three times a week    Attends religious service: More than 4 times per year    Active member of club or organization: Yes    Attends meetings of clubs or organizations: More than 4 times per year    Relationship status: Never married  Other Topics Concern  . Not on file  Social History Narrative  . Not on file    Family History  Problem Relation Age of Onset  . Heart disease Mother   . Breast cancer Maternal Aunt        dx in her 39s  . Heart disease Cousin 64       maternal cousin  . Heart attack Sister 85  . Cancer Paternal Aunt        NOS  . Cancer Maternal Aunt        NOS  . Lung cancer Cousin 23       maternal cousin - non smoker  . Brain cancer Cousin 13       maternal cousin's son  . Lung cancer Cousin 50       maternal cousin's son  . Breast cancer Cousin        paternal cousin  . Colon cancer Neg Hx     Past Medical History:  Diagnosis Date  . Allergy    latex  . Arthritis   . Bipolar 1 disorder  ([place])   . Bipolar 1 disorder ([facility]) 04/04/2013  . Blood transfusion without reported diagnosis    2014 during chemotherapy  . Breast cancer ([facility]) 03/10/13   left breast invasive ca  . Cancer ([facility])   . Coronary artery disease   . Diabetes mellitus without complication ([facility])  NIDDM  . GERD (gastroesophageal reflux disease)   . Heart murmur   . Hyperlipidemia   . Hyperlipidemia 04/04/2013  . Hypertension   . Hypertension 04/04/2013  . Obesity   . Personal history of chemotherapy   . Personal history of radiation therapy   . Radiation 01/30/14-03/19/14  . Sleep apnea    no cpap due to insurance    Past Surgical History:  Procedure Laterality Date  . ABDOMINAL HYSTERECTOMY    . BREAST BIOPSY Left 05/08/09   fibroadenoma with microcalcifications,no malignancy id  . BREAST BIOPSY Left 03/07/13   invasive ca,1 lymph node metastatic  . 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 BREAST LUMPECTOMY Right 2014  . BREAST LUMPECTOMY WITH NEEDLE LOCALIZATION AND AXILLARY SENTINEL LYMPH NODE BX Right 04/22/2013   Procedure: BREAST LUMPECTOMY WITH NEEDLE LOCALIZATION AND AXILLARY SENTINEL LYMPH NODE BX;  Surgeon: Adin Hector, MD;  Location: [facility];  Service: General;  Laterality: Right;  . CARDIAC CATHETERIZATION    . CHOLECYSTECTOMY    . CORONARY ANGIOPLASTY     LAD stent 2003  . CORONARY STENT PLACEMENT  2001  . ESOPHAGOGASTRODUODENOSCOPY N/A 08/07/2013   Procedure: ESOPHAGOGASTRODUODENOSCOPY (EGD);  Surgeon: Missy Sabins, MD;  Location: [facility];  Service: Endoscopy;  Laterality: N/A;  . ESOPHAGOGASTRODUODENOSCOPY N/A 11/13/2013   Procedure: ESOPHAGOGASTRODUODENOSCOPY (EGD);  Surgeon: Wonda Horner, MD;  [facility] END[facility]COPY;  Service: Endoscopy;  Laterality: N/A;  . EYE SURGERY  6/13,9/14   laser,cataract lft  . MASTECTOMY Left 2014  . MASTECTOMY MODIFIED RADICAL Left 04/22/2013   Procedure: MASTECTOMY MODIFIED RADICAL;  Surgeon:  Adin Hector, MD;  Location: [facility];  Service: General;  Laterality: Left;  . PORTACATH PLACEMENT Right 04/22/2013   Procedure: INSERTION PORT-A-CATH;  Surgeon: Adin Hector, MD;  Location: [facility];  Service: General;  Laterality: Right;     Current Outpatient Medications on File Prior to Visit  Medication Sig Dispense Refill  . amLODipine (NORVASC) 10 MG tablet TAKE 1 TABLET BY MOUTH EVERY DAY 90 tablet 1  . anastrozole (ARIMIDEX) 1 MG tablet Take 1 tablet (1 mg total) by mouth daily. 90 tablet 4  . buPROPion (WELLBUTRIN SR) 150 MG 12 hr tablet TAKE 1 TABLET BY MOUTH EVERY DAY 90 tablet 2  . cholecalciferol (VITAMIN D3) 25 MCG (1000 UT) tablet Take 1 tablet (1,000 Units total) by mouth daily. 100 tablet 4  . gabapentin (NEURONTIN) 100 MG capsule TAKE 1 CAPSULE BY MOUTH 3 TIMES A DAY 90 capsule 20  . hydrALAZINE (APRESOLINE) 25 MG tablet Take 1 tablet (25 mg total) by mouth 3 (three) times daily. 90 tablet 3  . lidocaine-prilocaine (EMLA) cream Apply topically as needed. Apply to port-a-cath one - two hours before accessing. 30 g 3  . losartan (COZAAR) 100 MG tablet Take 1 tablet (100 mg total) by mouth daily. 90 tablet 1  . metoprolol succinate (TOPROL-XL) 25 MG 24 hr tablet TAKE 1 TABLET BY MOUTH EVERY DAY 90 tablet 1  . naproxen (NAPROSYN) 500 MG tablet TAKE 1 TABLET BY MOUTH TWICE A DAY AS NEEDED 60 tablet 0  . ONETOUCH VERIO test strip CHECK BLOOD SUGAR TWICE A DAY BEFORE BREAKFAST AND DINNER 100 each 7  . pantoprazole (PROTONIX) 40 MG tablet Take 1 tablet (40 mg total) by mouth daily. 30 tablet 3  . pioglitazone-metformin (ACTOPLUS MET) 15-850 MG tablet TAKE 1 TABLET BY MOUTH EVERY DAY 90 tablet 1  . rosuvastatin (CRESTOR) 20 MG  tablet Take 1 tablet (20 mg total) by mouth daily. 30 tablet 12  . Semaglutide,0.25 or 0.5MG/DOS, (OZEMPIC, 0.25 OR 0.5 MG/DOSE,) 2 MG/1.5ML SOPN Inject 0.5 mg into the skin once a week. 1 pen 4   Current Facility-Administered Medications on File Prior to  Visit  Medication Dose Route Frequency Provider Last Rate Last Dose  . sodium chloride 0.9 % injection 10 mL  10 mL Intravenous PRN Marcy Panning, MD   10 mL at 05/24/13 1522    Allergies  Allergen Reactions  . Cephalexin Other (See Comments)    Burned throat.  . Adhesive [Tape] Rash    Clear tape gloves ,elastic no problem  . Latex Hives and Rash    Physical exam:  Today's Vitals   05/06/19 0957  BP: (!) 171/81  Pulse: 73  Temp: 97.8 F (36.6 C)  Weight: 207 lb (93.9 kg)  Height: _0  (1.575 m)   Body mass index is 37.86 kg/m.   Wt Readings from Last 3 Encounters:  05/06/19 207 lb (93.9 kg)  03/07/19 214 lb 9.6 oz (97.3 kg)  11/22/18 203 lb 3.2 oz (92.2 kg)     Ht Readings from Last 3 Encounters:  05/06/19 _1  (1.575 m)  03/07/19 _2  (1.575 m)  11/22/18 _3  (1.575 m)      General: The patient is awake, alert and appears not in acute distress. The patient is groomed. She answers all my questions.  Head: Normocephalic, atraumatic. Neck is supple. Mallampati: 3,  neck circumference:15. 25  inches . Nasal airflow patent today, but she reports frequent rhinitis. Hinton Dyer is seen.  Cardiovascular:  Regular rate and cardiac rhythm by pulse,  without distended neck veins. Respiratory: Lungs are clear to auscultation.  Skin:  Without evidence of ankle edema, or rash. Trunk: The patient's posture is erect.   Neurologic exam : The patient is awake and alert, oriented to place and time.   Memory subjective described as intact.  Attention span & concentration ability appears normal.  Speech is fluent,  without  dysarthria, dysphonia or aphasia.  Mood and affect are difficult to appreciate.    Cranial nerves: no loss of smell or taste reported  Pupils are equal - almost pin point- and reactive to light. Funduscopic exam deferred.   Extraocular movements in vertical and horizontal planes were intact and without nystagmus. Bilateral ptosis.  No Diplopia.  Visual fields by finger perimetry are intact. Hearing was intact to soft voice and finger rubbing.   Facial sensation intact to fine touch. Facial motor strength is symmetric and tongue and uvula move midline.  Neck ROM : rotation, tilt and flexion extension were normal for age and shoulder shrug was symmetrical.    Motor exam:  Symmetric bulk, tone and ROM.   Normal tone without cog wheeling, symmetric loss of  grip strength.  Thenar eminence atrophy- carpal tunnel signs and symptoms.  Sensory:  Fine touch, pinprick and vibration were tested  and  She has numbness in all five fingers of both hands. Proprioception tested in the upper extremities was normal. Toes are numb.  Coordination: Rapid alternating movements in the fingers/hands were of normal speed.  The Finger-to-nose maneuver was intact without evidence of ataxia, dysmetria or tremor.  Gait and station: Patient could rise unassisted from a seated position, walked with a cane as  assistive device.       After spending a total time of  35  minutes face to face and  additional time for physical and neurologic examination, review of laboratory studies,  personal review of imaging studies, reports and results of other testing and review of referral information / records as far as provided in visit, I have established the following assessments:  1) Sleep  hygiene and routines could be improved- there is daytime sleepiness and nighttime insomnia.   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    My Plan is to proceed with:  1) Attended Sleep study to evaluate RLS/ PLMs, reasons for Nocturia in OSA and also has a history of CHF,diastolic.  This warrants an evaluation by attended sleep study.    I would like to thank Dr Baird [facility] and [name], [place] [place]  [place],  [place] [address] for allowing me to meet with and to take care of this pleasant patient.   In short, Kathy Maxwell is presenting with insomnia and hypersomnia. Severe daytime sleepiness. , I plan to follow up either personally or through our NP within 2-3 month.   CC: I will share my notes with PCP.  Electronically signed by: Larey Seat, MD 05/06/2019 10:02 AM  [facility] certified by The [employer] of Sleep Medicine and Diplomate of the [employer] of Sleep Medicine. Board certified In Neurology through the [place], Fellow of the [employer] of Neurology. Medical Director of [employer].

## 2019-05-06 NOTE — Patient Instructions (Signed)

## 2019-05-09 ENCOUNTER — Telehealth: Payer: Self-pay

## 2019-05-09 LAB — HM DIABETES EYE EXAM

## 2019-05-14 ENCOUNTER — Telehealth: Payer: Self-pay

## 2019-05-14 NOTE — Telephone Encounter (Signed)
I called patient to notify her we have received her form for diabetic shoes but she has not had a diabetic foot exam so she is aware that we will do one when she comes in the office in nov. Pt was ok with this. YRL,RMA

## 2019-05-17 ENCOUNTER — Telehealth: Payer: Self-pay

## 2019-05-20 ENCOUNTER — Ambulatory Visit: Payer: Self-pay | Admitting: Pharmacist

## 2019-05-20 DIAGNOSIS — E1140 Type 2 diabetes mellitus with diabetic neuropathy, unspecified: Secondary | ICD-10-CM

## 2019-05-20 DIAGNOSIS — I10 Essential (primary) hypertension: Secondary | ICD-10-CM

## 2019-05-21 ENCOUNTER — Telehealth: Payer: Self-pay

## 2019-05-21 NOTE — Progress Notes (Signed)
Chronic Care Management   Visit Note  05/20/2019 Name: Kathy Maxwell MRN: [id number] DOB: June 14, 1956  Referred by: [name], FNP Reason for referral : Chronic Care Management   Kathy Maxwell is a 63 y.o. year old female who is a primary care patient of [name], [place]. The CCM team was consulted for assistance with chronic disease management and care coordination needs related to HTN and DMII  Review of patient status, including review of consultants reports, relevant laboratory and other test results, and collaboration with appropriate care team members and the patient's provider was performed as part of comprehensive patient evaluation and provision of chronic care management services.    I spoke with Kathy Maxwell by telephone today  Advanced Directives Status: [place] and [employer] application for related entries.   Medications: Outpatient Encounter Medications as of 05/20/2019  Medication Sig Note  . amLODipine (NORVASC) 10 MG tablet TAKE 1 TABLET BY MOUTH EVERY DAY   . anastrozole (ARIMIDEX) 1 MG tablet Take 1 tablet (1 mg total) by mouth daily.   [name] buPROPion (WELLBUTRIN SR) 150 MG 12 hr tablet TAKE 1 TABLET BY MOUTH EVERY DAY   . cholecalciferol (VITAMIN D3) 25 MCG (1000 UT) tablet Take 1 tablet (1,000 Units total) by mouth daily.   [name] gabapentin (NEURONTIN) 100 MG capsule TAKE 1 CAPSULE BY MOUTH 3 TIMES A DAY   . hydrALAZINE (APRESOLINE) 25 MG tablet Take 1 tablet (25 mg total) by mouth 3 (three) times daily.   [name] lidocaine-prilocaine (EMLA) cream Apply topically as needed. Apply to port-a-cath one - two hours before accessing.   [name] losartan (COZAAR) 100 MG tablet Take 1 tablet (100 mg total) by mouth daily.   . metoprolol succinate (TOPROL-XL) 25 MG 24 hr tablet TAKE 1 TABLET BY MOUTH EVERY DAY   . naproxen (NAPROSYN) 500 MG tablet TAKE 1 TABLET BY MOUTH TWICE A DAY AS NEEDED   . ONETOUCH VERIO test strip CHECK BLOOD SUGAR TWICE A DAY BEFORE BREAKFAST AND DINNER   .  pantoprazole (PROTONIX) 40 MG tablet Take 1 tablet (40 mg total) by mouth daily.   . pioglitazone-metformin (ACTOPLUS MET) 15-850 MG tablet TAKE 1 TABLET BY MOUTH EVERY DAY   . rosuvastatin (CRESTOR) 20 MG tablet Take 1 tablet (20 mg total) by mouth daily.   . Semaglutide,0.25 or 0.'5MG'$ /DOS, (OZEMPIC, 0.25 OR 0.5 MG/DOSE,) 2 MG/1.5ML SOPN Inject 0.5 mg into the skin once a week. 11/14/2018: Injects on Fridays   Facility-Administered Encounter Medications as of 05/20/2019  Medication  . sodium chloride 0.9 % injection 10 mL     Objective:   Goals Addressed            This Visit's Progress     Patient Stated   . I would like to improve my medication adherence (pt-stated)       Current Barriers:  . Non Adherence to prescribed medication regimen--dispense report does not match with prescribed regimen.  Will continue to assess compliance.  Pharmacist Clinical Goal(s):  [name] Over the next 90 days, patient will demonstrate Improved medication adherence as evidenced by on time refills . Over the next 90 days, patient will work with PharmD and PCP to optimize medication management of chronic conditions.  CCM SW Interventions Completed 04/18/2019 . Outbound call placed to the patient to assist with care coordination of chronic care management . Reviewed patients current medication regimen . Determined the patient is consistent with medications stating "I take my medicine every day  and my needle once a week" . Performed chart review to determined the patients moss recent A1C (03/07/2019) was 7.6 . Discussed DM diet compliance and patients current CBG readings . Determined the patient feels her DM has improved as she used to have fasting sugar readings close to 200 and the average is now at 130 each morning . Encouraged the patient to continue following care team recommendations and participating in a diabetes friendly diet . Informed by the patient she has taken her last dose of Losartan and is  having difficulty obtaining from the pharmacy . Collaboration with the patients primary provider requesting a new Rx be sent to the [facility] . Collaboration with embedded PharmD regarding patient progression of medication adherence goal  Interventions: Call completed with patient on 05/20/19 . Comprehensive medication review performed.   . Reviewed & discussed the following diabetes-related information with patient: o Continue checking blood sugars as directed o Follow ADA recommended "diabetes-friendly" diet  (reviewed healthy snack/food options) o Confirmed current DM regimen: Ozempic 0.'5mg'$  weekly, Actos Plus.  Will continue to optimize DM regimen.  Patient wishes to continue on current regimen.  We are hoping to optimize diet & exercise and potentially taper off of actos-->continue Ozempic and Metformin. o Discussed GLP-1 injection technique; Patient uses AccuCheck glucometer o Reviewed medication purpose/side effects-->patient denies adverse events, denies hypoglycemia, reports FBG<150, most recently in the upper 110s. o Most recent A1c increased from 6.5% -->7.6%.  Patient states diet was likely a contributor.  She was also unable to afford Ozempic and had to go a few weeks without it.  She has now been supplied with Ozempic until the end of the year. Recheck A1c this month on 06/11/19 o Continue taking all medications as prescribed by provider . Patient continues on statin, ASA, ARB as appropriate.  Statin last filled for 90-day supply (patient verbalizes 100% compliance/denies side effects.  Will follow up to address refills. . All blood pressure medications called in (amlodipine, hydralazine, metoprolol, losartan).  Encouraged patient to monitor BP since restarting new medications.  Patient states her BP has been doing okay.  She reports BP being around 120-140/80-90. Goal BP <130/80 . Bupropion refill also called in-->$8  Patient Self Care Activities:   . Self administers medications as prescribed . Attends all scheduled provider appointments . Calls pharmacy for medication refills  Please see past updates related to this goal by clicking on the "Past Updates" button in the selected goal          Plan:   The care management team will reach out to the patient again over the next 4-6 weeks.  Provider Signature Regina Eck, PharmD, BCPS Clinical Pharmacist, [facility] [place]: [phone]

## 2019-05-21 NOTE — Patient Instructions (Signed)
Visit Information  Goals Addressed            This Visit's Progress     Patient Stated   . I would like to improve my medication adherence (pt-stated)       Current Barriers:  . Non Adherence to prescribed medication regimen--dispense report does not match with prescribed regimen.  Will continue to assess compliance.  Pharmacist Clinical Goal(s):  [name] Over the next 90 days, patient will demonstrate Improved medication adherence as evidenced by on time refills . Over the next 90 days, patient will work with PharmD and PCP to optimize medication management of chronic conditions.  CCM SW Interventions Completed 04/18/2019 . Outbound call placed to the patient to assist with care coordination of chronic care management . Reviewed patients current medication regimen . Determined the patient is consistent with medications stating "I take my medicine every day and my needle once a week" . Performed chart review to determined the patients moss recent A1C (03/07/2019) was 7.6 . Discussed DM diet compliance and patients current CBG readings . Determined the patient feels her DM has improved as she used to have fasting sugar readings close to 200 and the average is now at 130 each morning . Encouraged the patient to continue following care team recommendations and participating in a diabetes friendly diet . Informed by the patient she has taken her last dose of Losartan and is having difficulty obtaining from the pharmacy . Collaboration with the patients primary provider requesting a new Rx be sent to the patients preferred [facility] . Collaboration with embedded PharmD regarding patient progression of medication adherence goal  Interventions: Call completed with patient on 05/20/19 . Comprehensive medication review performed.   . Reviewed & discussed the following diabetes-related information with patient: o Continue checking blood sugars as directed o Follow ADA recommended  "diabetes-friendly" diet  (reviewed healthy snack/food options) o Confirmed current DM regimen: Ozempic 0.5mg  weekly, Actos Plus.  Will continue to optimize DM regimen.  Patient wishes to continue on current regimen.  We are hoping to optimize diet & exercise and potentially taper off of actos-->continue Ozempic and Metformin. o Discussed GLP-1 injection technique; Patient uses AccuCheck glucometer o Reviewed medication purpose/side effects-->patient denies adverse events, denies hypoglycemia, reports FBG<150, most recently in the upper 110s. o Most recent A1c increased from 6.5% -->7.6%.  Patient states diet was likely a contributor.  She was also unable to afford Ozempic and had to go a few weeks without it.  She has now been supplied with Ozempic until the end of the year. Recheck A1c this month on 06/11/19 o Continue taking all medications as prescribed by provider . Patient continues on statin, ASA, ARB as appropriate.  Statin last filled for 90-day supply (patient verbalizes 100% compliance/denies side effects.  Will follow up to address refills. . All blood pressure medications called in (amlodipine, hydralazine, metoprolol, losartan).  Encouraged patient to monitor BP since restarting new medications.  Patient states her BP has been doing okay.  She reports BP being around 120-140/80-90. Goal BP <130/80 . Bupropion refill also called in-->$8  Patient Self Care Activities:  . Self administers medications as prescribed . Attends all scheduled provider appointments . Calls pharmacy for medication refills  Please see past updates related to this goal by clicking on the "Past Updates" button in the selected goal         The patient verbalized understanding of instructions provided today and declined a print copy of  patient Paediatric nurse.   The care management team will reach out to the patient again over the next 4-6 weeks.  SIGNATURE Regina Eck, PharmD, BCPS Clinical  Pharmacist, [facility] [place]: [phone]

## 2019-05-29 ENCOUNTER — Telehealth: Payer: Self-pay | Admitting: Neurology

## 2019-05-29 NOTE — Telephone Encounter (Signed)
We have attempted to call the patient 2 times to schedule sleep study. Patient has been unavailable at the phone numbers we have on file and has not returned our calls. If patient calls back we will schedule them for their sleep study. ° °

## 2019-05-31 ENCOUNTER — Inpatient Hospital Stay: Payer: Medicare Other | Attending: Oncology

## 2019-05-31 ENCOUNTER — Other Ambulatory Visit: Payer: Self-pay

## 2019-05-31 DIAGNOSIS — Z170 Estrogen receptor positive status [ER+]: Secondary | ICD-10-CM | POA: Diagnosis not present

## 2019-05-31 DIAGNOSIS — Z452 Encounter for adjustment and management of vascular access device: Secondary | ICD-10-CM | POA: Insufficient documentation

## 2019-05-31 DIAGNOSIS — D0511 Intraductal carcinoma in situ of right breast: Secondary | ICD-10-CM | POA: Insufficient documentation

## 2019-05-31 DIAGNOSIS — Z95828 Presence of other vascular implants and grafts: Secondary | ICD-10-CM

## 2019-05-31 DIAGNOSIS — C50412 Malignant neoplasm of upper-outer quadrant of left female breast: Secondary | ICD-10-CM | POA: Insufficient documentation

## 2019-05-31 DIAGNOSIS — C773 Secondary and unspecified malignant neoplasm of axilla and upper limb lymph nodes: Secondary | ICD-10-CM | POA: Diagnosis not present

## 2019-05-31 MED ORDER — SODIUM CHLORIDE 0.9% FLUSH
10.0000 mL | Freq: Once | INTRAVENOUS | Status: AC
  Administered 2019-05-31: 10 mL
  Filled 2019-05-31: qty 10

## 2019-05-31 MED ORDER — HEPARIN SOD (PORK) LOCK FLUSH 100 UNIT/ML IV SOLN
500.0000 [IU] | Freq: Once | INTRAVENOUS | Status: AC
  Administered 2019-05-31: 500 [IU]
  Filled 2019-05-31: qty 5

## 2019-05-31 NOTE — Patient Instructions (Signed)

## 2019-06-03 ENCOUNTER — Telehealth: Payer: Self-pay

## 2019-06-03 NOTE — Telephone Encounter (Signed)
Patient called stating she had a missed call from [name].  I RETURNED PT CALL AND LEFT HER AV/M TO CALL THE OFFICE. Lonia Mad

## 2019-06-10 ENCOUNTER — Telehealth: Payer: Self-pay

## 2019-06-10 ENCOUNTER — Ambulatory Visit: Payer: Self-pay

## 2019-06-10 DIAGNOSIS — I10 Essential (primary) hypertension: Secondary | ICD-10-CM

## 2019-06-10 DIAGNOSIS — E1140 Type 2 diabetes mellitus with diabetic neuropathy, unspecified: Secondary | ICD-10-CM

## 2019-06-10 NOTE — Chronic Care Management (AMB) (Signed)
  Chronic Care Management   Outreach Note  06/10/2019 Name: Kathy Maxwell MRN: [id number] DOB: February 03, 1956  Referred by: [name], FNP Reason for referral : Chronic Care Management (CCM RNCM Telephone Outreach )   An unsuccessful telephone outreach was attempted today. The patient was referred to the case management team by [name] FNP for assistance with care management and care coordination.   Follow Up Plan: A HIPPA compliant phone message was left for the patient providing contact information and requesting a return call.  Telephone follow up appointment with care management team member scheduled for: 07/04/19  Barb Merino, RN, BSN, CCM Care Management Coordinator [facility]  Direct Phone: [phone]

## 2019-06-12 ENCOUNTER — Encounter: Payer: Self-pay | Admitting: Nurse Practitioner

## 2019-06-12 ENCOUNTER — Ambulatory Visit (INDEPENDENT_AMBULATORY_CARE_PROVIDER_SITE_OTHER): Payer: Medicare Other | Admitting: Nurse Practitioner

## 2019-06-12 ENCOUNTER — Other Ambulatory Visit: Payer: Self-pay

## 2019-06-12 ENCOUNTER — Telehealth: Payer: Self-pay

## 2019-06-12 ENCOUNTER — Ambulatory Visit (INDEPENDENT_AMBULATORY_CARE_PROVIDER_SITE_OTHER): Payer: Medicare Other

## 2019-06-12 ENCOUNTER — Ambulatory Visit: Payer: Medicare HMO | Admitting: Nurse Practitioner

## 2019-06-12 ENCOUNTER — Ambulatory Visit: Payer: Medicare HMO

## 2019-06-12 VITALS — BP 142/74 | HR 78 | Temp 98.1°F | Ht 62.2 in | Wt 204.0 lb

## 2019-06-12 VITALS — BP 142/74 | HR 78 | Temp 98.1°F | Ht 62.2 in | Wt 204.8 lb

## 2019-06-12 DIAGNOSIS — Z23 Encounter for immunization: Secondary | ICD-10-CM | POA: Diagnosis not present

## 2019-06-12 DIAGNOSIS — E0841 Diabetes mellitus due to underlying condition with diabetic mononeuropathy: Secondary | ICD-10-CM

## 2019-06-12 DIAGNOSIS — Z794 Long term (current) use of insulin: Secondary | ICD-10-CM

## 2019-06-12 DIAGNOSIS — Z0001 Encounter for general adult medical examination with abnormal findings: Secondary | ICD-10-CM | POA: Diagnosis not present

## 2019-06-12 DIAGNOSIS — I10 Essential (primary) hypertension: Secondary | ICD-10-CM

## 2019-06-12 DIAGNOSIS — C778 Secondary and unspecified malignant neoplasm of lymph nodes of multiple regions: Secondary | ICD-10-CM

## 2019-06-12 DIAGNOSIS — Z139 Encounter for screening, unspecified: Secondary | ICD-10-CM | POA: Diagnosis not present

## 2019-06-12 DIAGNOSIS — N390 Urinary tract infection, site not specified: Secondary | ICD-10-CM

## 2019-06-12 DIAGNOSIS — Z0000 Encounter for general adult medical examination without abnormal findings: Secondary | ICD-10-CM

## 2019-06-12 DIAGNOSIS — E785 Hyperlipidemia, unspecified: Secondary | ICD-10-CM

## 2019-06-12 DIAGNOSIS — E1169 Type 2 diabetes mellitus with other specified complication: Secondary | ICD-10-CM

## 2019-06-12 DIAGNOSIS — Z1159 Encounter for screening for other viral diseases: Secondary | ICD-10-CM

## 2019-06-12 DIAGNOSIS — I110 Hypertensive heart disease with heart failure: Secondary | ICD-10-CM | POA: Diagnosis not present

## 2019-06-12 DIAGNOSIS — I5032 Chronic diastolic (congestive) heart failure: Secondary | ICD-10-CM | POA: Diagnosis not present

## 2019-06-12 LAB — POCT URINALYSIS DIPSTICK
Glucose, UA: NEGATIVE
Ketones, UA: NEGATIVE
Nitrite, UA: POSITIVE
Protein, UA: POSITIVE — AB
Spec Grav, UA: >=1.03 — AB (ref 1.010–1.025)
Urobilinogen, UA: 1 E.U./dL
pH, UA: 5 (ref 5.0–8.0)

## 2019-06-12 LAB — POCT UA - MICROALBUMIN
Creatinine, POC: 300 mg/dL
Microalbumin Ur, POC: 150 mg/L

## 2019-06-12 MED ORDER — OZEMPIC (0.25 OR 0.5 MG/DOSE) 2 MG/1.5ML ~~LOC~~ SOPN: 0.5000 mg | PEN_INJECTOR | SUBCUTANEOUS | 1 refills | Status: AC

## 2019-06-12 MED ORDER — PREVNAR 13 IM SUSP: 0.5000 mL | INTRAMUSCULAR | 0 refills | Status: AC

## 2019-06-12 MED ORDER — BOOSTRIX 5-2.5-18.5 LF-MCG/0.5 IM SUSP: 0.5000 mL | Freq: Once | INTRAMUSCULAR | 0 refills | Status: AC

## 2019-06-12 MED ORDER — NITROFURANTOIN MONOHYD MACRO 100 MG PO CAPS: 100.0000 mg | ORAL_CAPSULE | Freq: Two times a day (BID) | ORAL | 0 refills | Status: DC

## 2019-06-12 NOTE — Progress Notes (Signed)
Subjective:   Kathy Maxwell is a 63 y.o. female who presents for Medicare Annual (Subsequent) preventive examination.  This visit occurred during the SARS-CoV-2 public health emergency.  Safety protocols were in place, including screening questions prior to the visit, additional usage of staff PPE, and extensive cleaning of exam room while observing appropriate contact time as indicated for disinfecting solutions.   Review of Systems:  n/a Cardiac Risk Factors include: advanced age (>67mn, >>66women);dyslipidemia;diabetes mellitus;hypertension;obesity (BMI >30kg/m2);sedentary lifestyle     Objective:     Vitals: BP (!) 142/74 (BP Location: Right Arm, Patient Position: Sitting, Cuff Size: Normal)   Pulse 78   Temp 98.1 F (36.7 C) (Oral)   Ht 5' 2.2" (1.58 m)   Wt 204 lb (92.5 kg)   BMI 37.07 kg/m   Body mass index is 37.07 kg/m.  Advanced Directives 06/12/2019 10/12/2018 10/12/2018 06/06/2018 06/06/2018 02/01/2016 12/15/2015  Does Patient Have a Medical Advance Directive? _0  No No  Would patient like information on creating a medical advance directive? Yes (MAU/Ambulatory/Procedural Areas - Information given) No - Patient declined Yes (MAU/Ambulatory/Procedural Areas - Information given) - No - Patient declined - -  Pre-existing out of facility DNR order (yellow form or pink MOST form) - - - - - - -    Tobacco Social History   Tobacco Use  Smoking Status Former Smoker  . Packs/day: 1.00  . Years: 31.00  . Pack years: 31.00  . Types: Cigarettes  . Quit date: 07/19/1987  . Years since quitting: 31.9  Smokeless Tobacco Never Used     Counseling given: Not Answered   Clinical Intake:  Pre-visit preparation completed: Yes  Pain : No/denies pain     Nutritional Status: BMI > 30  Obese Nutritional Risks: None Diabetes: Yes CBG done?: No Did pt. bring in CBG monitor from home?: No  How often do you need to have someone help you when you read  instructions, pamphlets, or other written materials from your doctor or pharmacy?: 1 - Never What is the last grade level you completed in school?: 2 years college  Interpreter Needed?: No  Information entered by :: NAllen LPN  Past Medical History:  Diagnosis Date  . Allergy    latex  . Arthritis   . Bipolar 1 disorder ([facility]   . Bipolar 1 disorder ([facility] 04/04/2013  . Blood transfusion without reported diagnosis    2014 during chemotherapy  . Breast cancer ([facility] 03/10/13   left breast invasive ca  . Cancer ([facility]   . Coronary artery disease   . Diabetes mellitus without complication (HCC)    NIDDM  . GERD (gastroesophageal reflux disease)   . Heart murmur   . Hyperlipidemia   . Hyperlipidemia 04/04/2013  . Hypertension   . Hypertension 04/04/2013  . Obesity   . Personal history of chemotherapy   . Personal history of radiation therapy   . Radiation 01/30/14-03/19/14  . Sleep apnea    no cpap due to insurance   Past Surgical History:  Procedure Laterality Date  . ABDOMINAL HYSTERECTOMY    . BREAST BIOPSY Left 05/08/09   fibroadenoma with microcalcifications,no malignancy id  . BREAST BIOPSY Left 03/07/13   invasive ca,1 lymph node metastatic  . 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 BREAST LUMPECTOMY Right 2014  . BREAST LUMPECTOMY WITH NEEDLE LOCALIZATION AND AXILLARY SENTINEL LYMPH NODE BX Right 04/22/2013   Procedure: BREAST LUMPECTOMY WITH NEEDLE LOCALIZATION  AND AXILLARY SENTINEL LYMPH NODE BX;  Surgeon: Adin Hector, MD;  Location: [place];  Service: General;  Laterality: Right;  . CARDIAC CATHETERIZATION    . CHOLECYSTECTOMY    . CORONARY ANGIOPLASTY     LAD stent 2003  . CORONARY STENT PLACEMENT  2001  . ESOPHAGOGASTRODUODENOSCOPY N/A 08/07/2013   Procedure: ESOPHAGOGASTRODUODENOSCOPY (EGD);  Surgeon: Missy Sabins, MD;  Location: [facility];  Service: Endoscopy;  Laterality: N/A;  . ESOPHAGOGASTRODUODENOSCOPY  N/A 11/13/2013   Procedure: ESOPHAGOGASTRODUODENOSCOPY (EGD);  Surgeon: Wonda Horner, MD;  Location[facility];  Service: Endoscopy;  Laterality: N/A;  . EYE SURGERY  6/13,9/14   laser,cataract lft  . MASTECTOMY Left 2014  . MASTECTOMY MODIFIED RADICAL Left 04/22/2013   Procedure: MASTECTOMY MODIFIED RADICAL;  Surgeon: Adin Hector, MD;  Location: [place];  Service: General;  Laterality: Left;  . PORTACATH PLACEMENT Right 04/22/2013   Procedure: INSERTION PORT-A-CATH;  Surgeon: Adin Hector, MD;  Location: [place];  Service: General;  Laterality: Right;   Family History  Problem Relation Age of Onset  . Heart disease Mother   . Breast cancer Maternal Aunt        dx in her 47s  . Heart disease Cousin 46       maternal cousin  . Heart attack Sister 97  . Cancer Paternal Aunt        NOS  . Cancer Maternal Aunt        NOS  . Lung cancer Cousin 10       maternal cousin - non smoker  . Brain cancer Cousin 13       maternal cousin's son  . Lung cancer Cousin 24       maternal cousin's son  . Breast cancer Cousin        paternal cousin  . Colon cancer Neg Hx    Social History   Socioeconomic History  . Marital status: Single    Spouse name: Not on file  . Number of children: 0  . Years of education: Not on file  . Highest education level: Not on file  Occupational History  . Occupation: DISABLED    Employer: UNEMPLOYED  Social Needs  . Financial resource strain: Not very hard  . Food insecurity    Worry: Never true    Inability: Never true  . Transportation needs    Medical: No    Non-medical: No  Tobacco Use  . Smoking status: Former Smoker    Packs/day: 1.00    Years: 31.00    Pack years: 31.00    Types: Cigarettes    Quit date: 07/19/1987    Years since quitting: 31.9  . Smokeless tobacco: Never Used  Substance and Sexual Activity  . Alcohol use: No    Alcohol/week: 0.0 standard drinks  . Drug use: No  . Sexual activity: Not Currently    Birth  control/protection: Surgical  Lifestyle  . Physical activity    Days per week: 3 days    Minutes per session: 10 min  . Stress: Only a little  Relationships  . Social connections    Talks on phone: More than three times a week    Gets together: More than three times a week    Attends religious service: More than 4 times per year    Active member of club or organization: Yes    Attends meetings of clubs or organizations: More than 4 times per year    Relationship  status: Never married  Other Topics Concern  . Not on file  Social History Narrative  . Not on file    Outpatient Encounter Medications as of 06/12/2019  Medication Sig  . amLODipine (NORVASC) 10 MG tablet TAKE 1 TABLET BY MOUTH EVERY DAY  . anastrozole (ARIMIDEX) 1 MG tablet Take 1 tablet (1 mg total) by mouth daily.  [name] buPROPion (WELLBUTRIN SR) 150 MG 12 hr tablet TAKE 1 TABLET BY MOUTH EVERY DAY  . cholecalciferol (VITAMIN D3) 25 MCG (1000 UT) tablet Take 1 tablet (1,000 Units total) by mouth daily.  [name] gabapentin (NEURONTIN) 100 MG capsule TAKE 1 CAPSULE BY MOUTH 3 TIMES A DAY  . hydrALAZINE (APRESOLINE) 25 MG tablet Take 1 tablet (25 mg total) by mouth 3 (three) times daily.  [name] lidocaine-prilocaine (EMLA) cream Apply topically as needed. Apply to port-a-cath one - two hours before accessing.  [name] losartan (COZAAR) 100 MG tablet Take 1 tablet (100 mg total) by mouth daily.  . metoprolol succinate (TOPROL-XL) 25 MG 24 hr tablet TAKE 1 TABLET BY MOUTH EVERY DAY  . naproxen (NAPROSYN) 500 MG tablet TAKE 1 TABLET BY MOUTH TWICE A DAY AS NEEDED  . ONETOUCH VERIO test strip CHECK BLOOD SUGAR TWICE A DAY BEFORE BREAKFAST AND DINNER  . pantoprazole (PROTONIX) 40 MG tablet Take 1 tablet (40 mg total) by mouth daily.  . pioglitazone-metformin (ACTOPLUS MET) 15-850 MG tablet TAKE 1 TABLET BY MOUTH EVERY DAY  . pneumococcal 13-valent conjugate vaccine (PREVNAR 13) SUSP injection Inject 0.5 mLs into the muscle tomorrow at 10 am for 1  dose.  . rosuvastatin (CRESTOR) 20 MG tablet Take 1 tablet (20 mg total) by mouth daily.  . Tdap (BOOSTRIX) 5-2.5-18.5 LF-MCG/0.5 injection Inject 0.5 mLs into the muscle once for 1 dose.   Facility-Administered Encounter Medications as of 06/12/2019  Medication  . sodium chloride 0.9 % injection 10 mL    Activities of Daily Living In your present state of health, do you have any difficulty performing the following activities: 06/12/2019 06/12/2019  Hearing? N N  Vision? N N  Difficulty concentrating or making decisions? N N  Walking or climbing stairs? Y N  Comment due to hip pain -  Dressing or bathing? N N  Doing errands, shopping? N N  Preparing Food and eating ? N -  Using the Toilet? N -  In the past six months, have you accidently leaked urine? Y -  Comment occassional -  Do you have problems with loss of bowel control? N -  Managing your Medications? N -  Managing your Finances? N -  Housekeeping or managing your Housekeeping? N -  Some recent data might be hidden    Patient Care Team: [name], FNP as PCP - General (General Practice) Fanny Skates, MD as Consulting Physician (General Surgery) Rex Kras, Claudette Stapler, RN as Case Manager Jerline Pain, MD as Consulting Physician (Cardiology) Lavera Guise, [facility] (Pharmacist) Daneen Schick as Social Worker    Assessment:   This is a routine wellness examination for [facility].  Exercise Activities and Dietary recommendations Current Exercise Habits: Home exercise routine, Time (Minutes): 10, Frequency (Times/Week): 3, Weekly Exercise (Minutes/Week): 30  Goals    . 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 Impaired Physical Mobility . Poor gait/balance . Inability to exercise secondary to Right hip pain  Nurse  Case Manager Clinical Goal(s):  [name] Over the next 60 days, patient will work with  the Orthopedic MD to address needs related to treatment management for right hip pain  CCM SW Interventions Completed 04/18/2019 . Outbound call placed to the patient to assist with chronic care management and care coordination . Confirmed the patients rollator was received from DME company. Unfortunately, the patient is currently in [facility] and the rollator was delivered to her [place] address "I go between [place] and [place] because my nephew is also sick" . Assessed for patients ability to obtain equipment . Determined the patient has a scheduled provider appointment with Oncology on 04/19/2019 in [place] and plans to obtain equipment at that time . Collaboration with RN Case Manager regarding patient progression of current goal  CCM RN CM Interventions:  04/04/19 call completed with patient   . Evaluation of current treatment plan related to diagnosis and treatment of right hip and patient's adherence to plan as established by provider . Discussed patient has changed her mind about purchasing a cane at this time but would like to purchase a rollator walker; discussed having a new Rx sent to [place] for the rollator . Discussed patient will continue to use the cane she has on hand; patient encouraged to wear good supportive shoes and to avoid walking on uneven ground . Discussed the referral for Orthopedics has been sent to [facility] and she should receive a call from their office . Collaborated with [name], FNP regarding patients request for Rx for a rollator walker instead of the cane; requested Rx for a rollator be faxed to [place] and should include the patient's ht/wt . Discussed plans with patient for ongoing care management follow up and provided patient with direct contact information for care management team   Patient Self Care Activities:  . Self administers medications as prescribed . Attends all scheduled provider appointments . Calls  pharmacy for medication refills . Attends church or other social activities . Performs ADL's independently . Performs IADL's independently . Calls provider office for new concerns or questions  Please see past updates related to this goal by clicking on the "Past Updates" button in the selected goal      . "I would like to learn how to eat better to help my diabetes" (pt-stated)     Current Barriers:  [name] Knowledge Deficits related to diabetes disease process and Self Health Management  . Film/video editor.   Nurse Case Manager Clinical Goal(s):  [name] Over the next 60 days, patient will work with the CCM team to address needs related to improved adherence and understanding of diabetes disease process and Self Health management as evidence by patient will verbalize 100% adherence to taking her medications as prescribed and implementing portion control while following a diabetic friendly diet.  . Over the next 90 days, patient will lower her A1C below 7.6%  CCM RN CM Interventions:  03/19/19 call completed with patient  . Evaluation of current treatment plan related to diabetes disease management and patient's adherence to plan as established by provider. . Provided education to patient re: how to achieve a lower A1C; discussed recent increase to A1C from 6.5 to 7.6; discussed target A1C is <7.0; patient admitted she was not taking her diabetic medications due to cost issues; she was not adhering to her diabetic diet; discussed patient is interested in learning how to eat better using portion control . Reviewed medications with patient and discussed importance of  medication adherence along with following a ADA diet, and implementing exercise in her daily routine as tolerated; reviewed current regimen includes, Ozempic 0.31m weekly injection, ActoPlus-Met 15-850 mg 1 tablet daily  . Discussed plans with patient for ongoing care management follow up and provided patient with direct contact  information for care management team . Provided patient with printed educational materials related to Diabetic Meal Planning Using the Plate Method and portion control, s/s Hypo/Hyperglycemia, Diabetes Zone Management tool  Patient Self Care Activities:  . Self administers medications as prescribed . Attends all scheduled provider appointments . Calls pharmacy for medication refills . Attends church or other social activities . Performs ADL's independently . Performs IADL's independently . Calls provider office for new concerns or questions  Initial goal documentation     . I would like to improve my medication adherence (pt-stated)     Current Barriers:  . Non Adherence to prescribed medication regimen--dispense report does not match with prescribed regimen.  Will continue to assess compliance.  Pharmacist Clinical Goal(s):  .Marland KitchenOver the next 90 days, patient will demonstrate Improved medication adherence as evidenced by on time refills . Over the next 90 days, patient will work with PharmD and PCP to optimize medication management of chronic conditions.  CCM SW Interventions Completed 04/18/2019 . Outbound call placed to the patient to assist with care coordination of chronic care management . Reviewed patients current medication regimen . Determined the patient is consistent with medications stating "I take my medicine every day and my needle once a week" . Performed chart review to determined the patients moss recent A1C (03/07/2019) was 7.6 . Discussed DM diet compliance and patients current CBG readings . Determined the patient feels her DM has improved as she used to have fasting sugar readings close to 200 and the average is now at 130 each morning . Encouraged the patient to continue following care team recommendations and participating in a diabetes friendly diet . Informed by the patient she has taken her last dose of Losartan and is having difficulty obtaining from the  pharmacy . Collaboration with the patients primary provider requesting a [facility]. Collaboration with embedded PharmD regarding patient progression of medication adherence goal  Interventions: Call completed with patient on 05/20/19 . Comprehensive medication review performed.   . Reviewed & discussed the following diabetes-related information with patient: o Continue checking blood sugars as directed o Follow ADA recommended "diabetes-friendly" diet  (reviewed healthy snack/food options) o Confirmed current DM regimen: Ozempic 0.562mweekly, Actos Plus.  Will continue to optimize DM regimen.  Patient wishes to continue on current regimen.  We are hoping to optimize diet & exercise and potentially taper off of actos-->continue Ozempic and Metformin. o Discussed GLP-1 injection technique; Patient uses AccuCheck glucometer o Reviewed medication purpose/side effects-->patient denies adverse events, denies hypoglycemia, reports FBG<150, most recently in the upper 110s. o Most recent A1c increased from 6.5% -->7.6%.  Patient states diet was likely a contributor.  She was also unable to afford Ozempic and had to go a few weeks without it.  She has now been supplied with Ozempic until the end of the year. Recheck A1c this month on 06/11/19 o Continue taking all medications as prescribed by provider . Patient continues on statin, ASA, ARB as appropriate.  Statin last filled for 90-day supply (patient verbalizes 100% compliance/denies side effects.  Will follow up to address refills. . All blood pressure medications  called in (amlodipine, hydralazine, metoprolol, losartan).  Encouraged patient to monitor BP since restarting new medications.  Patient states her BP has been doing okay.  She reports BP being around 120-140/80-90. Goal BP <130/80 . Bupropion refill also called in-->$8  Patient Self Care Activities:  . Self administers medications as  prescribed . Attends all scheduled provider appointments . Calls pharmacy for medication refills  Please see past updates related to this goal by clicking on the "Past Updates" button in the selected goal      . Patient Stated     11/25/202, wants to get diabetes under control and lose 10 pounds    . Weight (lb) < 200 lb (90.7 kg)     Loose 50 lb       Fall Risk Fall Risk  06/12/2019 06/12/2019 03/07/2019 11/22/2018 06/06/2018  Falls in the past year? 0 0 1 0 0  Number falls in past yr: - - 0 - -  Injury with Fall? - - 0 - -  Risk for fall due to : Medication side effect - - - -  Follow up Falls evaluation completed;Education provided;Falls prevention discussed - - - -   Is the patient's home free of loose throw rugs in walkways, pet beds, electrical cords, etc?   yes      Grab bars in the bathroom? no      Handrails on the stairs?   yes      Adequate lighting?   yes  Timed Get Up and Go performed: n/a  Depression Screen PHQ 2/9 Scores 06/12/2019 03/07/2019 11/22/2018 10/12/2018  PHQ - 2 Score 0 0 0 0  PHQ- 9 Score 5 - - -     Cognitive Function     6CIT Screen 06/12/2019 06/06/2018  What Year? 0 points 0 points  What month? 0 points 0 points  What time? 0 points 0 points  Count back from 20 0 points 0 points  Months in reverse 0 points 0 points  Repeat phrase 0 points 0 points  Total Score 0 0    Immunization History  Administered Date(s) Administered  . Influenza,inj,Quad PF,6+ Mos 04/24/2013, 05/01/2015, 04/30/2018, 06/06/2018  . Influenza-Unspecified 04/03/2014, 03/08/2019  . Pneumococcal Polysaccharide-23 06/06/2018    Qualifies for Shingles Vaccine? yes  Screening Tests Health Maintenance  Topic Date Due  . OPHTHALMOLOGY EXAM  02/21/1964  . HIV Screening  02/20/1969  . TETANUS/TDAP  02/20/1973  . PNA vac Low Risk Adult (1 of 2 - PCV13) 06/07/2019  . PAP SMEAR-Modifier  06/12/2019 (Originally 02/21/1975)  . HEMOGLOBIN A1C  09/07/2019  . MAMMOGRAM   09/14/2019  . FOOT EXAM  03/06/2020  . COLONOSCOPY  02/08/2026  . INFLUENZA VACCINE  Completed  . DEXA SCAN  Completed  . Hepatitis C Screening  Completed    Cancer Screenings: Lung: Low Dose CT Chest recommended if Age 60-80 years, 30 pack-year currently smoking OR have quit w/in 15years. Patient does not qualify. Breast:  Up to date on Mammogram? Yes   Up to date of Bone Density/Dexa? Yes Colorectal: up to date  Additional Screenings: : Hepatitis C Screening: today     Plan:    Patient wants to get diabetes under control and lose 10 pounds.   I have personally reviewed and noted the following in the patient's chart:   . Medical and social history . Use of alcohol, tobacco or illicit drugs  . Current medications and supplements . Functional ability and status . Nutritional status . Physical  activity . Advanced directives . List of other physicians . Hospitalizations, surgeries, and ER visits in previous 12 months . Vitals . Screenings to include cognitive, depression, and falls . Referrals and appointments  In addition, I have reviewed and discussed with patient certain preventive protocols, quality metrics, and best practice recommendations. A written personalized care plan for preventive services as well as general preventive health recommendations were provided to patient.     Kellie Simmering, LPN  [id number]

## 2019-06-12 NOTE — Patient Instructions (Signed)
[name] , Thank you for taking time to come for your Medicare Wellness Visit. I appreciate your ongoing commitment to your health goals. Please review the following plan we discussed and let me know if I can assist you in the future.   Screening recommendations/referrals: Colonoscopy: 01/2016 Mammogram: 11/2018 Bone Density: 03/2014 Recommended yearly ophthalmology/optometry visit for glaucoma screening and checkup Recommended yearly dental visit for hygiene and checkup  Vaccinations: Influenza vaccine: 02/2019 Pneumococcal vaccine: sent to pharmacy Tdap vaccine: sent to pharmacy Shingles vaccine: discussed    Advanced directives: Advance directive discussed with you today. I have provided a copy for you to complete at home and have notarized. Once this is complete please bring a copy in to our office so we can scan it into your chart.   Conditions/risks identified: obesity  Next appointment: 06/18/2020 at 8:45   Preventive Care 65 Years and Older, Female Preventive care refers to lifestyle choices and visits with your health care provider that can promote health and wellness. What does preventive care include?  A yearly physical exam. This is also called an annual well check.  Dental exams once or twice a year.  Routine eye exams. Ask your health care provider how often you should have your eyes checked.  Personal lifestyle choices, including:  Daily care of your teeth and gums.  Regular physical activity.  Eating a healthy diet.  Avoiding tobacco and drug use.  Limiting alcohol use.  Practicing safe sex.  Taking low-dose aspirin every day.  Taking vitamin and mineral supplements as recommended by your health care provider. What happens during an annual well check? The services and screenings done by your health care provider during your annual well check will depend on your age, overall health, lifestyle risk factors, and family history of disease. Counseling   Your health care provider may ask you questions about your:  Alcohol use.  Tobacco use.  Drug use.  Emotional well-being.  Home and relationship well-being.  Sexual activity.  Eating habits.  History of falls.  Memory and ability to understand (cognition).  Work and work Statistician.  Reproductive health. Screening  You may have the following tests or measurements:  Height, weight, and BMI.  Blood pressure.  Lipid and cholesterol levels. These may be checked every 5 years, or more frequently if you are over 80 years old.  Skin check.  Lung cancer screening. You may have this screening every year starting at age 3 if you have a 30-pack-year history of smoking and currently smoke or have quit within the past 15 years.  Fecal occult blood test (FOBT) of the stool. You may have this test every year starting at age 32.  Flexible sigmoidoscopy or colonoscopy. You may have a sigmoidoscopy every 5 years or a colonoscopy every 10 years starting at age 29.  Hepatitis C blood test.  Hepatitis B blood test.  Sexually transmitted disease (STD) testing.  Diabetes screening. This is done by checking your blood sugar (glucose) after you have not eaten for a while (fasting). You may have this done every 1-3 years.  Bone density scan. This is done to screen for osteoporosis. You may have this done starting at age 79.  Mammogram. This may be done every 1-2 years. Talk to your health care provider about how often you should have regular mammograms. Talk with your health care provider about your test results, treatment options, and if necessary, the need for more tests. Vaccines  Your health care provider may recommend certain  vaccines, such as:  Influenza vaccine. This is recommended every year.  Tetanus, diphtheria, and acellular pertussis (Tdap, Td) vaccine. You may need a Td booster every 10 years.  Zoster vaccine. You may need this after age 65.  Pneumococcal 13-valent  conjugate (PCV13) vaccine. One dose is recommended after age 65.  Pneumococcal polysaccharide (PPSV23) vaccine. One dose is recommended after age 65. Talk to your health care provider about which screenings and vaccines you need and how often you need them. This information is not intended to replace advice given to you by your health care provider. Make sure you discuss any questions you have with your health care provider. Document Released: 07/31/2015 Document Revised: 03/23/2016 Document Reviewed: 05/05/2015 Elsevier Interactive Patient Education  2017 [place] Prevention in the Home Falls can cause injuries. They can happen to people of all ages. There are many things you can do to make your home safe and to help prevent falls. What can I do on the outside of my home?  Regularly fix the edges of walkways and driveways and fix any cracks.  Remove anything that might make you trip as you walk through a door, such as a raised step or threshold.  Trim any bushes or trees on the path to your home.  Use bright outdoor lighting.  Clear any walking paths of anything that might make someone trip, such as rocks or tools.  Regularly check to see if handrails are loose or broken. Make sure that both sides of any steps have handrails.  Any raised decks and porches should have guardrails on the edges.  Have any leaves, snow, or ice cleared regularly.  Use sand or salt on walking paths during winter.  Clean up any spills in your garage right away. This includes oil or grease spills. What can I do in the bathroom?  Use night lights.  Install grab bars by the toilet and in the tub and shower. Do not use towel bars as grab bars.  Use non-skid mats or decals in the tub or shower.  If you need to sit down in the shower, use a plastic, non-slip stool.  Keep the floor dry. Clean up any water that spills on the floor as soon as it happens.  Remove soap buildup in the tub or  shower regularly.  Attach bath mats securely with double-sided non-slip rug tape.  Do not have throw rugs and other things on the floor that can make you trip. What can I do in the bedroom?  Use night lights.  Make sure that you have a light by your bed that is easy to reach.  Do not use any sheets or blankets that are too big for your bed. They should not hang down onto the floor.  Have a firm chair that has side arms. You can use this for support while you get dressed.  Do not have throw rugs and other things on the floor that can make you trip. What can I do in the kitchen?  Clean up any spills right away.  Avoid walking on wet floors.  Keep items that you use a lot in easy-to-reach places.  If you need to reach something above you, use a strong step stool that has a grab bar.  Keep electrical cords out of the way.  Do not use floor polish or wax that makes floors slippery. If you must use wax, use non-skid floor wax.  Do not have throw rugs and other things  on the floor that can make you trip. What can I do with my stairs?  Do not leave any items on the stairs.  Make sure that there are handrails on both sides of the stairs and use them. Fix handrails that are broken or loose. Make sure that handrails are as long as the stairways.  Check any carpeting to make sure that it is firmly attached to the stairs. Fix any carpet that is loose or worn.  Avoid having throw rugs at the top or bottom of the stairs. If you do have throw rugs, attach them to the floor with carpet tape.  Make sure that you have a light switch at the top of the stairs and the bottom of the stairs. If you do not have them, ask someone to add them for you. What else can I do to help prevent falls?  Wear shoes that:  Do not have high heels.  Have rubber bottoms.  Are comfortable and fit you well.  Are closed at the toe. Do not wear sandals.  If you use a stepladder:  Make sure that it is fully  opened. Do not climb a closed stepladder.  Make sure that both sides of the stepladder are locked into place.  Ask someone to hold it for you, if possible.  Clearly mark and make sure that you can see:  Any grab bars or handrails.  First and last steps.  Where the edge of each step is.  Use tools that help you move around (mobility aids) if they are needed. These include:  Canes.  Walkers.  Scooters.  Crutches.  Turn on the lights when you go into a dark area. Replace any light bulbs as soon as they burn out.  Set up your furniture so you have a clear path. Avoid moving your furniture around.  If any of your floors are uneven, fix them.  If there are any pets around you, be aware of where they are.  Review your medicines with your doctor. Some medicines can make you feel dizzy. This can increase your chance of falling. Ask your doctor what other things that you can do to help prevent falls. This information is not intended to replace advice given to you by your health care provider. Make sure you discuss any questions you have with your health care provider. Document Released: 04/30/2009 Document Revised: 12/10/2015 Document Reviewed: 08/08/2014 Elsevier Interactive Patient Education  2017 [employer].

## 2019-06-12 NOTE — Progress Notes (Signed)
Subjective:     Patient ID: Kathy Maxwell , female    DOB: Oct 31, 1955 , 63 y.o.   MRN: [id number]   Chief Complaint  Patient presents with  . Annual Exam    HPI  Here for Health Maintenance Diabetes She presents for her follow-up diabetic visit. She has type 2 diabetes mellitus. Her disease course has been fluctuating. Pertinent negatives for hypoglycemia include no dizziness or headaches. There are no diabetic associated symptoms. Pertinent negatives for diabetes include no chest pain. There are no hypoglycemic complications. There are no diabetic complications. Risk factors for coronary artery disease include diabetes mellitus, obesity, sedentary lifestyle and hypertension.    The patient states she is status post hysterectomy for birth control.  Mammogram last done 11/22/2018 - mastectomy Negative for: breast discharge, breast lump(s), breast pain and breast self exam.  Pertinent negatives include abnormal bleeding (hematology), anxiety, decreased libido, depression, difficulty falling sleep, dyspareunia, history of infertility, nocturia, sexual dysfunction, sleep disturbances, urinary incontinence, urinary urgency, vaginal discharge and vaginal itching. Diet regular. The patient states her exercise level is  minimal.      The patient's tobacco use is:  Social History   Tobacco Use  Smoking Status Former Smoker  . Packs/day: 1.00  . Years: 31.00  . Pack years: 31.00  . Types: Cigarettes  . Quit date: 07/19/1987  . Years since quitting: 31.9  Smokeless Tobacco Never Used   She has been exposed to passive smoke. The patient's alcohol use is:  Social History   Substance and Sexual Activity  Alcohol Use No  . Alcohol/week: 0.0 standard drinks   Past Medical History:  Diagnosis Date  . Allergy    latex  . Arthritis   . Bipolar 1 disorder ([place])   . Bipolar 1 disorder ([place]) 04/04/2013  . Blood transfusion without reported diagnosis    2014 during chemotherapy  . Breast cancer  ([place]) 03/10/13   left breast invasive ca  . Cancer ([place])   . Coronary artery disease   . Diabetes mellitus without complication (HCC)    NIDDM  . GERD (gastroesophageal reflux disease)   . Heart murmur   . Hyperlipidemia   . Hyperlipidemia 04/04/2013  . Hypertension   . Hypertension 04/04/2013  . Obesity   . Personal history of chemotherapy   . Personal history of radiation therapy   . Radiation 01/30/14-03/19/14  . Sleep apnea    no cpap due to insurance     Family History  Problem Relation Age of Onset  . Heart disease Mother   . Breast cancer Maternal Aunt        dx in her 70s  . Heart disease Cousin 58       maternal cousin  . Heart attack Sister 73  . Cancer Paternal Aunt        NOS  . Cancer Maternal Aunt        NOS  . Lung cancer Cousin 51       maternal cousin - non smoker  . Brain cancer Cousin 13       maternal cousin's son  . Lung cancer Cousin 54       maternal cousin's son  . Breast cancer Cousin        paternal cousin  . Colon cancer Neg Hx      Current Outpatient Medications:  .  amLODipine (NORVASC) 10 MG tablet, TAKE 1 TABLET BY MOUTH EVERY DAY, Disp: 90 tablet, Rfl: 1 .  anastrozole (ARIMIDEX)  1 MG tablet, Take 1 tablet (1 mg total) by mouth daily., Disp: 90 tablet, Rfl: 4 .  buPROPion (WELLBUTRIN SR) 150 MG 12 hr tablet, TAKE 1 TABLET BY MOUTH EVERY DAY, Disp: 90 tablet, Rfl: 2 .  cholecalciferol (VITAMIN D3) 25 MCG (1000 UT) tablet, Take 1 tablet (1,000 Units total) by mouth daily., Disp: 100 tablet, Rfl: 4 .  gabapentin (NEURONTIN) 100 MG capsule, TAKE 1 CAPSULE BY MOUTH 3 TIMES A DAY, Disp: 90 capsule, Rfl: 20 .  hydrALAZINE (APRESOLINE) 25 MG tablet, Take 1 tablet (25 mg total) by mouth 3 (three) times daily., Disp: 90 tablet, Rfl: 3 .  lidocaine-prilocaine (EMLA) cream, Apply topically as needed. Apply to port-a-cath one - two hours before accessing., Disp: 30 g, Rfl: 3 .  losartan (COZAAR) 100 MG tablet, Take 1 tablet (100 mg total) by mouth  daily., Disp: 90 tablet, Rfl: 1 .  metoprolol succinate (TOPROL-XL) 25 MG 24 hr tablet, TAKE 1 TABLET BY MOUTH EVERY DAY, Disp: 90 tablet, Rfl: 1 .  naproxen (NAPROSYN) 500 MG tablet, TAKE 1 TABLET BY MOUTH TWICE A DAY AS NEEDED, Disp: 60 tablet, Rfl: 0 .  ONETOUCH VERIO test strip, CHECK BLOOD SUGAR TWICE A DAY BEFORE BREAKFAST AND DINNER, Disp: 100 each, Rfl: 7 .  pantoprazole (PROTONIX) 40 MG tablet, Take 1 tablet (40 mg total) by mouth daily., Disp: 30 tablet, Rfl: 3 .  pioglitazone-metformin (ACTOPLUS MET) 15-850 MG tablet, TAKE 1 TABLET BY MOUTH EVERY DAY, Disp: 90 tablet, Rfl: 1 .  rosuvastatin (CRESTOR) 20 MG tablet, Take 1 tablet (20 mg total) by mouth daily., Disp: 30 tablet, Rfl: 12 .  Semaglutide,0.25 or 0.'5MG'$ /DOS, (OZEMPIC, 0.25 OR 0.5 MG/DOSE,) 2 MG/1.5ML SOPN, Inject 0.5 mg into the skin once a week., Disp: 1 pen, Rfl: 4 No current facility-administered medications for this visit.   Facility-Administered Medications Ordered in Other Visits:  .  sodium chloride 0.9 % injection 10 mL, 10 mL, Intravenous, PRN, Marcy Panning, MD, 10 mL at 05/24/13 1522   Allergies  Allergen Reactions  . Cephalexin Other (See Comments)    Burned throat.  . Adhesive [Tape] Rash    Clear tape gloves ,elastic no problem  . Latex Hives and Rash     Review of Systems  Constitutional: Negative.   HENT: Negative.   Eyes: Negative.   Respiratory: Negative.   Cardiovascular: Negative.  Negative for chest pain, palpitations and leg swelling.  Gastrointestinal: Negative.   Endocrine: Negative.   Genitourinary: Negative.   Musculoskeletal: Negative.   Skin: Negative.   Allergic/Immunologic: Negative.   Neurological: Negative.  Negative for dizziness and headaches.  Hematological: Negative.   Psychiatric/Behavioral: Negative.      Today's Vitals   06/12/19 1007  BP: (!) 142/74  Pulse: 78  Temp: 98.1 F (36.7 C)  TempSrc: Oral  Weight: 204 lb 12.8 oz (92.9 kg)  Height: 5' 2.2" (1.58 m)    Body mass index is 37.22 kg/m.   Objective:  Physical Exam Constitutional:      General: She is not in acute distress.    Appearance: Normal appearance. She is well-developed. She is obese.  HENT:     Head: Normocephalic and atraumatic.     Right Ear: Hearing, tympanic membrane and external ear normal.     Left Ear: Hearing, tympanic membrane and external ear normal.  Eyes:     General: Lids are normal.     Extraocular Movements: Extraocular movements intact.     Conjunctiva/sclera: Conjunctivae normal.  Pupils: Pupils are equal, round, and reactive to light.     Funduscopic exam:    Right eye: No papilledema.        Left eye: No papilledema.  Neck:     Musculoskeletal: Full passive range of motion without pain, normal range of motion and neck supple.     Thyroid: No thyroid mass.     Vascular: No carotid bruit.  Cardiovascular:     Rate and Rhythm: Normal rate and regular rhythm.     Pulses: Normal pulses.     Heart sounds: Murmur present.  Pulmonary:     Effort: Pulmonary effort is normal. No respiratory distress.     Breath sounds: Normal breath sounds.  Abdominal:     General: Abdomen is flat. Bowel sounds are normal. There is no distension.     Palpations: Abdomen is soft.     Tenderness: There is no abdominal tenderness.     Comments: rounded  Musculoskeletal: Normal range of motion.        General: No swelling.     Right lower leg: No edema.     Left lower leg: No edema.  Skin:    General: Skin is warm and dry.     Capillary Refill: Capillary refill takes less than 2 seconds.  Neurological:     General: No focal deficit present.     Mental Status: She is alert and oriented to person, place, and time.     Cranial Nerves: No cranial nerve deficit.     Sensory: No sensory deficit.  Psychiatric:        Mood and Affect: Mood normal.        Behavior: Behavior normal.        Thought Content: Thought content normal.        Judgment: Judgment normal.          Assessment And Plan:     1. Encounter for general adult medical examination with abnormal findings . Behavior modifications discussed and diet history reviewed.   . Pt will continue to exercise regularly and modify diet with low GI, plant based foods and decrease intake of processed foods.  . Recommend intake of daily multivitamin, Vitamin D, and calcium.  . Recommend mammogram and colonoscopy for preventive screenings, as well as recommend immunizations that include influenza, TDAP, and Shingles  2. Encounter for hepatitis C screening test for low risk patient  Will check for Hepatitis C screening due to being born between the years 46-1967 - Hepatitis C antibody  3. Encounter for screening  - HIV antibody (with reflex)  4. Essential hypertension . B/P is controlled.  . CMP ordered to check renal function.  . The importance of regular exercise and dietary modification was stressed to the patient.  . Stressed importance of losing ten percent of her body weight to help with B/P control.   - POCT Urinalysis Dipstick (81002) - POCT UA - Microalbumin - EKG 12-Lead - CMP14+EGFR  5. Type 2 diabetes mellitus with other specified complication, with long-term current use of insulin (HCC)  Chronic, fair control  She is doing well with ozempic  Continue with current medications  Will check HgbA1c - CMP14+EGFR - Hemoglobin A1c  6. Chronic diastolic heart failure (HCC)  Stable, continue follow up with cardiology  7. Hyperlipidemia, unspecified hyperlipidemia type Chronic, controlled Continue with current medications - Lipid Profile  8. Diabetic mononeuropathy associated with diabetes mellitus due to underlying condition ([place])  Stable, continue with medications - Semaglutide,0.25  or 0.'5MG'$ /DOS, (OZEMPIC, 0.25 OR 0.5 MG/DOSE,) 2 MG/1.5ML SOPN; Inject 0.5 mg into the skin once a week.  Dispense: 9 mL; Refill: 1  9. Urinary tract infection without hematuria, site  unspecified  Nitrates in urine will treat with nitrofurantoin and send urine for culture - nitrofurantoin, macrocrystal-monohydrate, (MACROBID) 100 MG capsule; Take 1 capsule (100 mg total) by mouth 2 (two) times daily.  Dispense: 10 capsule; Refill: 0 - Culture, Urine  10. 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 Alain Marion, FNP    THE PATIENT IS ENCOURAGED TO PRACTICE SOCIAL DISTANCING DUE TO THE COVID-19 PANDEMIC.

## 2019-06-12 NOTE — Telephone Encounter (Signed)
Per Doreene Burke sent in a prescription for a UTI I LVM telling pt she has a prescription to pick up at the pharmacy

## 2019-06-13 LAB — CMP14+EGFR
ALT: 15 IU/L (ref 0–32)
AST: 18 IU/L (ref 0–40)
Albumin/Globulin Ratio: 1.6 (ref 1.2–2.2)
Albumin: 4.7 g/dL (ref 3.8–4.8)
Alkaline Phosphatase: 178 IU/L — ABNORMAL HIGH (ref 39–117)
BUN/Creatinine Ratio: 15 (ref 12–28)
BUN: 17 mg/dL (ref 8–27)
Bilirubin Total: 0.3 mg/dL (ref 0.0–1.2)
CO2: 23 mmol/L (ref 20–29)
Calcium: 10.1 mg/dL (ref 8.7–10.3)
Chloride: 102 mmol/L (ref 96–106)
Creatinine, Ser: 1.15 mg/dL — ABNORMAL HIGH (ref 0.57–1.00)
GFR calc Af Amer: 58 mL/min/{1.73_m2} — ABNORMAL LOW (ref >59)
GFR calc non Af Amer: 50 mL/min/{1.73_m2} — ABNORMAL LOW (ref >59)
Globulin, Total: 2.9 g/dL (ref 1.5–4.5)
Glucose: 124 mg/dL — ABNORMAL HIGH (ref 65–99)
Potassium: 4.1 mmol/L (ref 3.5–5.2)
Sodium: 140 mmol/L (ref 134–144)
Total Protein: 7.6 g/dL (ref 6.0–8.5)

## 2019-06-13 LAB — HEPATITIS C ANTIBODY: Hep C Virus Ab: <0.1 s/co ratio (ref 0.0–0.9)

## 2019-06-13 LAB — HIV ANTIBODY (ROUTINE TESTING W REFLEX): HIV Screen 4th Generation wRfx: NONREACTIVE

## 2019-06-13 LAB — LIPID PANEL
Chol/HDL Ratio: 1.8 ratio (ref 0.0–4.4)
Cholesterol, Total: 118 mg/dL (ref 100–199)
HDL: 64 mg/dL (ref >39)
LDL Chol Calc (NIH): 40 mg/dL (ref 0–99)
Triglycerides: 68 mg/dL (ref 0–149)
VLDL Cholesterol Cal: 14 mg/dL (ref 5–40)

## 2019-06-13 LAB — HEMOGLOBIN A1C
Est. average glucose Bld gHb Est-mCnc: 128 mg/dL
Hgb A1c MFr Bld: 6.1 % — ABNORMAL HIGH (ref 4.8–5.6)

## 2019-06-16 LAB — URINE CULTURE

## 2019-06-17 ENCOUNTER — Ambulatory Visit (INDEPENDENT_AMBULATORY_CARE_PROVIDER_SITE_OTHER): Payer: Medicare Other | Admitting: Pharmacist

## 2019-06-17 DIAGNOSIS — N390 Urinary tract infection, site not specified: Secondary | ICD-10-CM

## 2019-06-17 DIAGNOSIS — Z794 Long term (current) use of insulin: Secondary | ICD-10-CM | POA: Diagnosis not present

## 2019-06-17 DIAGNOSIS — E1169 Type 2 diabetes mellitus with other specified complication: Secondary | ICD-10-CM | POA: Diagnosis not present

## 2019-06-18 ENCOUNTER — Other Ambulatory Visit: Payer: Self-pay | Admitting: Nurse Practitioner

## 2019-06-18 DIAGNOSIS — Z23 Encounter for immunization: Secondary | ICD-10-CM

## 2019-06-18 MED ORDER — TETANUS-DIPHTH-ACELL PERTUSSIS 5-2.5-18.5 LF-MCG/0.5 IM SUSP: 0.5000 mL | Freq: Once | INTRAMUSCULAR | 0 refills | Status: AC

## 2019-06-19 NOTE — Progress Notes (Signed)
Chronic Care Management    Visit Note  06/17/2019 Name: Kathy Maxwell MRN: [id number] DOB: 1956/01/08  Referred by: Kathy Brine, FNP Reason for referral : Chronic Care Management   Kathy Maxwell is a 63 y.o. year old female who is a primary care patient of Kathy Maxwell, [place]. The CCM team was consulted for assistance with chronic disease management and care coordination needs related to DMII  Review of patient status, including review of consultants reports, relevant laboratory and other test results, and collaboration with appropriate care team members and the patient's provider was performed as part of comprehensive patient evaluation and provision of chronic care management services.    I spoke with [name] by telephone today  Medications: Outpatient Encounter Medications as of 06/17/2019  Medication Sig  . amLODipine (NORVASC) 10 MG tablet TAKE 1 TABLET BY MOUTH EVERY DAY  . anastrozole (ARIMIDEX) 1 MG tablet Take 1 tablet (1 mg total) by mouth daily.  [name] buPROPion (WELLBUTRIN SR) 150 MG 12 hr tablet TAKE 1 TABLET BY MOUTH EVERY DAY  . cholecalciferol (VITAMIN D3) 25 MCG (1000 UT) tablet Take 1 tablet (1,000 Units total) by mouth daily.  [name] gabapentin (NEURONTIN) 100 MG capsule TAKE 1 CAPSULE BY MOUTH 3 TIMES A DAY  . hydrALAZINE (APRESOLINE) 25 MG tablet Take 1 tablet (25 mg total) by mouth 3 (three) times daily.  [name] lidocaine-prilocaine (EMLA) cream Apply topically as needed. Apply to port-a-cath one - two hours before accessing.  [name] losartan (COZAAR) 100 MG tablet Take 1 tablet (100 mg total) by mouth daily.  . metoprolol succinate (TOPROL-XL) 25 MG 24 hr tablet TAKE 1 TABLET BY MOUTH EVERY DAY  . naproxen (NAPROSYN) 500 MG tablet TAKE 1 TABLET BY MOUTH TWICE A DAY AS NEEDED  . nitrofurantoin, macrocrystal-monohydrate, (MACROBID) 100 MG capsule Take 1 capsule (100 mg total) by mouth 2 (two) times daily.  Kathy Maxwell VERIO test strip CHECK BLOOD SUGAR TWICE A DAY BEFORE  BREAKFAST AND DINNER  . pantoprazole (PROTONIX) 40 MG tablet Take 1 tablet (40 mg total) by mouth daily.  . pioglitazone-metformin (ACTOPLUS MET) 15-850 MG tablet TAKE 1 TABLET BY MOUTH EVERY DAY  . rosuvastatin (CRESTOR) 20 MG tablet Take 1 tablet (20 mg total) by mouth daily.  . Semaglutide,0.25 or 0.'5MG'$ /DOS, (OZEMPIC, 0.25 OR 0.5 MG/DOSE,) 2 MG/1.5ML SOPN Inject 0.5 mg into the skin once a week.   Facility-Administered Encounter Medications as of 06/17/2019  Medication  . sodium chloride 0.9 % injection 10 mL     Objective:   Goals Addressed            This Visit's Progress     Patient Stated   . Completed antibiotic for UTI (pt-stated)       Current Barriers:  [name] Knowledge Deficits related to UTI and antibiotic  Pharmacist Clinical Goal(s):  [name] Over the next 10 days, patient will demonstrate improved understanding of prescribed medications and rationale for usage as evidenced by completion of antibiotic for UTI  Interventions: . Patient called to ask about antibiotic called in to pharmacy.  She was prescribed Macrobid BID for 5 days for E.coli in the urine culture.  Encouraged patient to pick up and start this medication and finish to completion.  Explained that urine may turn neon yellow and not to be alarmed.  She denies fever, chills and back pain.  Patient verbalizes understanding.  Patient Self Care Activities:  . Patient verbalizes understanding of plan to complete therapy for UTI . Self  administers medications as prescribed  Initial goal documentation     . I would like financial assistance with some of my medications (pt-stated)       Current Barriers:  . Non Adherence to prescribed medication regimen . Financial Barriers  Pharmacist Clinical Goal(s):  [name] Over the next 60 days, patient will work with CCM pharmacist to address needs related to applying for financial assistance for Ozempic.      Interventions: . Advised patient to continue taking medications as  prescribed by provider. Patient takes Ozempic on Fridays . Application for Ozempic patient assistance to be mailed to patient & sent to provider. [facility] C[facility]T, Etter Sjogren assisting with PAP process for 2021  . Patient has ben approved for Ozempic until 07/18/2019.  Will submit application for Ozempic 2021 approval . Discussed plans with patient for ongoing care management follow up and provided patient with direct contact information for care management team . Collaboration with provider re: medication management  Patient Self Care Activities:  . Self administers medications as prescribed . Attends all scheduled provider appointments . Calls pharmacy for medication refills  Please see past updates related to this goal by clicking on the "Past Updates" button in the selected goal         Plan:   The care management team will reach out to the patient again over the next 30 days.   Provider Signature Regina Eck, PharmD, BCPS Clinical Pharmacist, [facility] [place]: [phone]

## 2019-06-19 NOTE — Patient Instructions (Signed)
Visit Information  Goals Addressed            This Visit's Progress     Patient Stated   . Completed antibiotic for UTI (pt-stated)       Current Barriers:  [name] Knowledge Deficits related to UTI and antibiotic  Pharmacist Clinical Goal(s):  [name] Over the next 10 days, patient will demonstrate improved understanding of prescribed medications and rationale for usage as evidenced by completion of antibiotic for UTI  Interventions: . Patient called to ask about antibiotic called in to pharmacy.  She was prescribed Macrobid BID for 5 days for E.coli in the urine culture.  Encouraged patient to pick up and start this medication and finish to completion.  Explained that urine may turn neon yellow and not to be alarmed.  She denies fever, chills and back pain.  Patient verbalizes understanding.  Patient Self Care Activities:  . Patient verbalizes understanding of plan to complete therapy for UTI . Self administers medications as prescribed  Initial goal documentation     . I would like financial assistance with some of my medications (pt-stated)       Current Barriers:  . Non Adherence to prescribed medication regimen . Financial Barriers  Pharmacist Clinical Goal(s):  [name] Over the next 60 days, patient will work with CCM pharmacist to address needs related to applying for financial assistance for Ozempic.      Interventions: . Advised patient to continue taking medications as prescribed by provider. Patient takes Ozempic on Fridays . Application for Ozempic patient assistance to be mailed to patient & sent to provider. [facility] C[facility], Etter Sjogren assisting with PAP process for 2021  . Patient has ben approved for Ozempic until 07/18/2019.  Will submit application for Ozempic 2021 approval . Discussed plans with patient for ongoing care management follow up and provided patient with direct contact information for care management team . Collaboration with provider re: medication  management  Patient Self Care Activities:  . Self administers medications as prescribed . Attends all scheduled provider appointments . Calls pharmacy for medication refills  Please see past updates related to this goal by clicking on the "Past Updates" button in the selected goal         The patient verbalized understanding of instructions provided today and declined a print copy of patient instruction materials.   The care management team will reach out to the patient again over the next 30 days.   SIGNATURE Regina Eck, PharmD, BCPS Clinical Pharmacist, [facility] [place]: [phone]

## 2019-06-20 ENCOUNTER — Other Ambulatory Visit: Payer: Self-pay | Admitting: Pharmacy Technician

## 2019-06-20 NOTE — Progress Notes (Signed)
See if we can get a copy of her visit

## 2019-06-20 NOTE — Patient Outreach (Signed)
[facility]) Care Management  06/20/2019  Kathy Maxwell May 05, 1956 [id number]                                       Medication Assistance Referral  Referral From: [facility] Embedded RPh Jenne Pane.   Medication/Company: [name] / [employer] Patient application portion:  Education officer, museum portion: Faxed  to J. Laurance Flatten, [place] Provider address/fax verified via: Office website   Follow up:  Will follow up with patient in 10-14 business days to confirm application(s) have been received.  Maud Deed Chana Bode [place] Certified Pharmacy Technician [place] Management Direct [phone]

## 2019-06-21 ENCOUNTER — Encounter: Payer: Self-pay | Admitting: Nurse Practitioner

## 2019-06-24 ENCOUNTER — Other Ambulatory Visit: Payer: Self-pay

## 2019-06-24 ENCOUNTER — Ambulatory Visit: Payer: Self-pay | Admitting: Pharmacist

## 2019-06-24 DIAGNOSIS — I10 Essential (primary) hypertension: Secondary | ICD-10-CM

## 2019-06-24 DIAGNOSIS — E1169 Type 2 diabetes mellitus with other specified complication: Secondary | ICD-10-CM

## 2019-06-24 DIAGNOSIS — Z794 Long term (current) use of insulin: Secondary | ICD-10-CM

## 2019-06-24 MED ORDER — HYDRALAZINE HCL 25 MG PO TABS: 25.0000 mg | ORAL_TABLET | Freq: Three times a day (TID) | ORAL | 1 refills | Status: DC

## 2019-06-24 NOTE — Patient Outreach (Signed)
[facility]) Care Management  06/24/2019  Kathy Maxwell 10/07/1955 [id number]   Medication Adherence call to Kathy Maxwell HIPPA Compliant Voice message left with a call back number. Kathy Maxwell is showing past due on Rosuvastatin 20 mg under [place].   [place] Management Direct Dial [phone]  Fax [phone] [email or web address]

## 2019-06-26 ENCOUNTER — Other Ambulatory Visit: Payer: Self-pay | Admitting: Cardiology

## 2019-06-26 ENCOUNTER — Other Ambulatory Visit: Payer: Self-pay | Admitting: Nurse Practitioner

## 2019-06-26 NOTE — Progress Notes (Addendum)
Chronic Care Management   Visit Note  06/24/2019 Name: Kathy Maxwell MRN: [id number] DOB: 05-Dec-1955  Referred by: [name], FNP Reason for referral : Chronic Care Management   Kathy Maxwell is a 63 y.o. year old female who is a primary care patient of [name], [place]. The CCM team was consulted for assistance with chronic disease management and care coordination needs related to HTN and DMII  Review of patient status, including review of consultants reports, relevant laboratory and other test results, and collaboration with appropriate care team members and the patient's provider was performed as part of comprehensive patient evaluation and provision of chronic care management services.    I spoke with [name] by telephone today.  Medications: Outpatient Encounter Medications as of 06/24/2019  Medication Sig  . amLODipine (NORVASC) 10 MG tablet TAKE 1 TABLET BY MOUTH EVERY DAY  . anastrozole (ARIMIDEX) 1 MG tablet Take 1 tablet (1 mg total) by mouth daily.  [name] buPROPion (WELLBUTRIN SR) 150 MG 12 hr tablet TAKE 1 TABLET BY MOUTH EVERY DAY  . cholecalciferol (VITAMIN D3) 25 MCG (1000 UT) tablet Take 1 tablet (1,000 Units total) by mouth daily.  [name] gabapentin (NEURONTIN) 100 MG capsule TAKE 1 CAPSULE BY MOUTH 3 TIMES A DAY  . lidocaine-prilocaine (EMLA) cream Apply topically as needed. Apply to port-a-cath one - two hours before accessing.  [name] losartan (COZAAR) 100 MG tablet Take 1 tablet (100 mg total) by mouth daily.  . naproxen (NAPROSYN) 500 MG tablet TAKE 1 TABLET BY MOUTH TWICE A DAY AS NEEDED  . nitrofurantoin, macrocrystal-monohydrate, (MACROBID) 100 MG capsule Take 1 capsule (100 mg total) by mouth 2 (two) times daily.  Glory Rosebush VERIO test strip CHECK BLOOD SUGAR TWICE A DAY BEFORE BREAKFAST AND DINNER  . pantoprazole (PROTONIX) 40 MG tablet Take 1 tablet (40 mg total) by mouth daily.  . pioglitazone-metformin (ACTOPLUS MET) 15-850 MG tablet TAKE 1 TABLET BY MOUTH  EVERY DAY  . rosuvastatin (CRESTOR) 20 MG tablet Take 1 tablet (20 mg total) by mouth daily.  . Semaglutide,0.25 or 0.'5MG'$ /DOS, (OZEMPIC, 0.25 OR 0.5 MG/DOSE,) 2 MG/1.5ML SOPN Inject 0.5 mg into the skin once a week.  . [DISCONTINUED] hydrALAZINE (APRESOLINE) 25 MG tablet Take 1 tablet (25 mg total) by mouth 3 (three) times daily.  . [DISCONTINUED] metoprolol succinate (TOPROL-XL) 25 MG 24 hr tablet TAKE 1 TABLET BY MOUTH EVERY DAY   Facility-Administered Encounter Medications as of 06/24/2019  Medication  . sodium chloride 0.9 % injection 10 mL     Objective:   Goals Addressed            This Visit's Progress     Patient Stated   . Complete antibiotic for UTI (pt-stated)       Current Barriers:  [name] Knowledge Deficits related to UTI and antibiotic  Pharmacist Clinical Goal(s):  [name] Over the next 10 days, patient will demonstrate improved understanding of prescribed medications and rationale for usage as evidenced by completion of antibiotic for UTI  Interventions: . Patient called to ask about antibiotic called in to pharmacy.  She was prescribed Macrobid BID for 5 days for E.coli in the urine culture.  Encouraged patient to pick up and start this medication and finish to completion.  Explained that urine may turn neon yellow and not to be alarmed.  She denies fever, chills and back pain.  Patient verbalizes understanding. . Patient states she will start this Wednesday 06/26/19.   Patient Self Care Activities:  .  Patient verbalizes understanding of plan to complete therapy for UTI . Self administers medications as prescribed  Please see past updates related to this goal by clicking on the "Past Updates" button in the selected goal      . I would like to improve my medication adherence (pt-stated)       Current Barriers:  . Non Adherence to prescribed medication regimen--dispense report does not match with prescribed regimen.  Will continue to assess compliance.  Pharmacist  Clinical Goal(s):  [name] Over the next 90 days, patient will demonstrate Improved medication adherence as evidenced by on time refills . Over the next 90 days, patient will work with PharmD and PCP to optimize medication management of chronic conditions.  Interventions: Call completed with patient on 06/24/19 . Comprehensive medication review performed.   . Reviewed & discussed the following diabetes-related information with patient: o Continue checking blood sugars as directed o Follow ADA recommended "diabetes-friendly" diet  (reviewed healthy snack/food options) o Confirmed current DM regimen: Ozempic 0.'5mg'$  weekly, Actos Plus.  Will continue to optimize DM regimen.  Patient wishes to continue on current regimen.   - We are hoping to optimize diet & exercise and potentially taper off of actos-->continue Ozempic and Metformin. - Patient d/c actoplus o Discussed GLP-1 injection technique; Patient uses AccuCheck glucometer o Reviewed medication purpose/side effects-->patient denies adverse events, denies hypoglycemia, reports FBG<150, most recently in the upper 110s. o Most recent A1c decreased from 7.6%-->6.1%.   o Continue taking all medications as prescribed by provider . Patient continues on statin, ASA, ARB as appropriate.  Statin last filled for 90-day supply (patient verbalizes 100% compliance/denies side effects.  Will follow up to address refills. o Refill called in for rosuvastatin (pt last filled in 02/2019) . All blood pressure medications were reviewed (amlodipine, hydralazine, metoprolol, losartan).  Patient needed refill of hydralazine.  Message sent to [place] (confirmed refill sent).  Encouraged patient to monitor BP since restarting new medications.  Patient states her BP has been doing okay.  She reports BP being around 120-140/80-90. Goal BP <130/80  Patient Self Care Activities:  . Self administers medications as prescribed . Attends all scheduled provider appointments . Calls  pharmacy for medication refills  Please see past updates related to this goal by clicking on the "Past Updates" button in the selected goal         Plan:   The care management team will reach out to the patient again over the next 30 days.   Provider Signature Regina Eck, PharmD, BCPS Clinical Pharmacist, [facility] [place]: [phone]

## 2019-06-26 NOTE — Patient Instructions (Signed)
Visit Information  Goals Addressed            This Visit's Progress     Patient Stated   . Complete antibiotic for UTI (pt-stated)       Current Barriers:  [name] Knowledge Deficits related to UTI and antibiotic  Pharmacist Clinical Goal(s):  [name] Over the next 10 days, patient will demonstrate improved understanding of prescribed medications and rationale for usage as evidenced by completion of antibiotic for UTI  Interventions: . Patient called to ask about antibiotic called in to pharmacy.  She was prescribed Macrobid BID for 5 days for E.coli in the urine culture.  Encouraged patient to pick up and start this medication and finish to completion.  Explained that urine may turn neon yellow and not to be alarmed.  She denies fever, chills and back pain.  Patient verbalizes understanding. . Patient states she will start this Wednesday 06/26/19.   Patient Self Care Activities:  . Patient verbalizes understanding of plan to complete therapy for UTI . Self administers medications as prescribed  Please see past updates related to this goal by clicking on the "Past Updates" button in the selected goal      . I would like to improve my medication adherence (pt-stated)       Current Barriers:  . Non Adherence to prescribed medication regimen--dispense report does not match with prescribed regimen.  Will continue to assess compliance.  Pharmacist Clinical Goal(s):  [name] Over the next 90 days, patient will demonstrate Improved medication adherence as evidenced by on time refills . Over the next 90 days, patient will work with PharmD and PCP to optimize medication management of chronic conditions.  Interventions: Call completed with patient on 06/24/19 . Comprehensive medication review performed.   . Reviewed & discussed the following diabetes-related information with patient: o Continue checking blood sugars as directed o Follow ADA recommended "diabetes-friendly" diet  (reviewed healthy  snack/food options) o Confirmed current DM regimen: Ozempic 0.5mg  weekly, Actos Plus.  Will continue to optimize DM regimen.  Patient wishes to continue on current regimen.   - We are hoping to optimize diet & exercise and potentially taper off of actos-->continue Ozempic and Metformin. - Patient d/c actoplus o Discussed GLP-1 injection technique; Patient uses AccuCheck glucometer o Reviewed medication purpose/side effects-->patient denies adverse events, denies hypoglycemia, reports FBG<150, most recently in the upper 110s. o Most recent A1c decreased from 7.6%-->6.1%.   o Continue taking all medications as prescribed by provider . Patient continues on statin, ASA, ARB as appropriate.  Statin last filled for 90-day supply (patient verbalizes 100% compliance/denies side effects.  Will follow up to address refills. . All blood pressure medications were reviewed (amlodipine, hydralazine, metoprolol, losartan).  Patient needed refill of hydralazine.  Message sent to [place] (confirmed refill sent).  Encouraged patient to monitor BP since restarting new medications.  Patient states her BP has been doing okay.  She reports BP being around 120-140/80-90. Goal BP <130/80  Patient Self Care Activities:  . Self administers medications as prescribed . Attends all scheduled provider appointments . Calls pharmacy for medication refills  Please see past updates related to this goal by clicking on the "Past Updates" button in the selected goal         The patient verbalized understanding of instructions provided today and declined a print copy of patient instruction materials.   The care management team will reach out to the patient again over the next 30 days.   SIGNATURE Almyra Free  Kathy Maxwell, PharmD, BCPS Clinical Pharmacist, [facility] [place]: [phone]

## 2019-07-02 ENCOUNTER — Encounter: Payer: Self-pay | Admitting: Nurse Practitioner

## 2019-07-02 DIAGNOSIS — B351 Tinea unguium: Secondary | ICD-10-CM | POA: Diagnosis not present

## 2019-07-04 ENCOUNTER — Telehealth: Payer: Self-pay

## 2019-07-04 ENCOUNTER — Ambulatory Visit (INDEPENDENT_AMBULATORY_CARE_PROVIDER_SITE_OTHER): Payer: Medicare Other

## 2019-07-04 ENCOUNTER — Other Ambulatory Visit: Payer: Self-pay

## 2019-07-04 DIAGNOSIS — E119 Type 2 diabetes mellitus without complications: Secondary | ICD-10-CM | POA: Diagnosis not present

## 2019-07-04 DIAGNOSIS — Z794 Long term (current) use of insulin: Secondary | ICD-10-CM | POA: Diagnosis not present

## 2019-07-04 DIAGNOSIS — E1169 Type 2 diabetes mellitus with other specified complication: Secondary | ICD-10-CM

## 2019-07-04 DIAGNOSIS — I10 Essential (primary) hypertension: Secondary | ICD-10-CM

## 2019-07-08 NOTE — Chronic Care Management (AMB) (Signed)
Chronic Care Management   Follow Up Note   07/04/2019 Name: Kathy Maxwell MRN: [id number] DOB: 09-Oct-1955  Referred by: [name], FNP Reason for referral : Chronic Care Management (CCM RNCM Telephone Follow up)   Kathy Maxwell is a 63 y.o. year old female who is a primary care patient of [name], [place]. The CCM team was consulted for assistance with chronic disease management and care coordination needs.    Review of patient status, including review of consultants reports, relevant laboratory and other test results, and collaboration with appropriate care team members and the patient's provider was performed as part of comprehensive patient evaluation and provision of chronic care management services.    SDOH (Social Determinants of Health) screening performed today: None. See Care Plan for related entries.   Placed outbound call to [name] for a CCM RN CM telephone follow up and her care plan was updated.   Outpatient Encounter Medications as of 07/04/2019  Medication Sig  . amLODipine (NORVASC) 10 MG tablet TAKE 1 TABLET BY MOUTH EVERY DAY  . anastrozole (ARIMIDEX) 1 MG tablet Take 1 tablet (1 mg total) by mouth daily.  [name] buPROPion (WELLBUTRIN SR) 150 MG 12 hr tablet TAKE 1 TABLET BY MOUTH EVERY DAY  . cholecalciferol (VITAMIN D3) 25 MCG (1000 UT) tablet Take 1 tablet (1,000 Units total) by mouth daily.  [name] gabapentin (NEURONTIN) 100 MG capsule TAKE 1 CAPSULE BY MOUTH 3 TIMES A DAY  . hydrALAZINE (APRESOLINE) 25 MG tablet Take 1 tablet (25 mg total) by mouth 3 (three) times daily.  [name] lidocaine-prilocaine (EMLA) cream Apply topically as needed. Apply to port-a-cath one - two hours before accessing.  [name] losartan (COZAAR) 100 MG tablet Take 1 tablet (100 mg total) by mouth daily.  . metoprolol succinate (TOPROL-XL) 25 MG 24 hr tablet TAKE 1 TABLET BY MOUTH EVERY DAY  . naproxen (NAPROSYN) 500 MG tablet TAKE 1 TABLET BY MOUTH TWICE A DAY AS NEEDED  . nitrofurantoin,  macrocrystal-monohydrate, (MACROBID) 100 MG capsule Take 1 capsule (100 mg total) by mouth 2 (two) times daily.  Glory Rosebush VERIO test strip CHECK BLOOD SUGAR TWICE A DAY BEFORE BREAKFAST AND DINNER  . pantoprazole (PROTONIX) 40 MG tablet Take 1 tablet (40 mg total) by mouth daily.  . pioglitazone-metformin (ACTOPLUS MET) 15-850 MG tablet TAKE 1 TABLET BY MOUTH EVERY DAY  . rosuvastatin (CRESTOR) 20 MG tablet TAKE 1 TABLET BY MOUTH EVERY DAY  . Semaglutide,0.25 or 0.'5MG'$ /DOS, (OZEMPIC, 0.25 OR 0.5 MG/DOSE,) 2 MG/1.5ML SOPN Inject 0.5 mg into the skin once a week.   Facility-Administered Encounter Medications as of 07/04/2019  Medication  . sodium chloride 0.9 % injection 10 mL     Goals Addressed      Patient Stated   . "I would like to learn how to eat better to help my diabetes" (pt-stated)       Current Barriers:  [name] Knowledge Deficits related to diabetes disease process and Self Health Management  . Film/video editor.   Nurse Case Manager Clinical Goal(s):  [name] Over the next 60 days, patient will work with the CCM team to address needs related to improved adherence and understanding of diabetes disease process and Self Health management as evidence by patient will verbalize 100% adherence to taking her medications as prescribed and implementing portion control while following a diabetic friendly diet.  . Over the next 90 days, patient will lower her A1C below 7.6%  CCM RN CM Interventions:  07/04/19 completed  with patient  . Evaluation of current treatment plan related to diabetes disease management and patient's adherence to plan as established by provider . Positive reinforcement given to patient for lowering her A1C down to 6.1 obtained on 11/25; discussed patient is adhering to Meal planning and portion control  . Determined patient is adhering to taking her medications as prescribed; Ozempic 0.'5mg'$  weekly injection, ActoPlus-Met 15-850 mg 1 tablet daily  . Patient encouraged to  continue Self monitoring her CBG's daily before meals and report abnormal readings to the CCM team and or PCP promptly . Discussed plans with patient for ongoing care management follow up and provided patient with direct contact information for care management team  Patient Self Care Activities:  . Self administers medications as prescribed . Attends all scheduled provider appointments . Calls pharmacy for medication refills . Attends church or other social activities . Performs ADL's independently . Performs IADL's independently . Calls provider office for new concerns or questions  Please see past updates related to this goal by clicking on the "Past Updates" button in the selected goal      . "to keep my BP under control" (pt-stated)       Current Barriers:  [name] Knowledge Deficits related to Hypertension  . Chronic Disease Management support and education needs related to Hypertension   Nurse Case Manager Clinical Goal(s):  [name] Over the next 90 days, patient will work with CCM team  to address needs related to support and education related to Hypertension   CCM RN CM Interventions:  07/04/19 call completed with patient  . Evaluation of current treatment plan related to Hypertension and patient's adherence to plan as established by provider. . Advised patient to check with her health plan OTC benefit guide to inquire about purchasing a BP cuff for home use . Reviewed medications with patient and discussed patient is adhering to taking her BP meds exactly as prescribed . Discussed plans with patient for ongoing care management follow up and provided patient with direct contact information for care management team . Advised patient, providing education and rationale, to monitor blood pressure daily and record, calling the CCM team and or PCP for findings outside established parameters.  . Provided patient with printed educational materials related to What is High Blood Pressure, Why Should  I restrict Sodium?; African-Americans and High Blood Pressure: Life's Simple 7  Patient Self Care Activities:  . Self administers medications as prescribed . Attends all scheduled provider appointments . Calls pharmacy for medication refills . Performs ADL's independently . Performs IADL's independently . Calls provider office for new concerns or questions  Initial goal documentation     . COMPLETED: Complete antibiotic for UTI (pt-stated)       Current Barriers:  [name] Knowledge Deficits related to UTI and antibiotic  Pharmacist Clinical Goal(s):  [name] Over the next 10 days, patient will demonstrate improved understanding of prescribed medications and rationale for usage as evidenced by completion of antibiotic for UTI  CCM RN CM Interventions: 07/05/19 call completed  . Confirmed patient started Macrobid Wednesday 06/26/19 and completed the full course of treatment for UTI . Instructed patient to report s/s of recurrent UTI as soon as detected for early treatment and prevention of severe UTI and or hospitalization . Patient verbalizes understanding and denies having current symptoms since completion of her antibiotic  Patient Self Care Activities:  . Patient verbalizes understanding of plan to complete therapy for UTI . Self administers medications as prescribed  Please see  past updates related to this goal by clicking on the "Past Updates" button in the selected goal         Telephone follow up appointment with care management team member scheduled for: 08/22/19  Barb Merino, RN, BSN, CCM Care Management Coordinator [place] [facility]  Direct Phone: [phone]

## 2019-07-08 NOTE — Patient Instructions (Signed)
Visit Information  Goals Addressed      Patient Stated   . "I would like to learn how to eat better to help my diabetes" (pt-stated)       Current Barriers:  [name] Knowledge Deficits related to diabetes disease process and Self Health Management  . Film/video editor.   Nurse Case Manager Clinical Goal(s):  [name] Over the next 60 days, patient will work with the CCM team to address needs related to improved adherence and understanding of diabetes disease process and Self Health management as evidence by patient will verbalize 100% adherence to taking her medications as prescribed and implementing portion control while following a diabetic friendly diet.  . Over the next 90 days, patient will lower her A1C below 7.6%  CCM RN CM Interventions:  07/04/19 completed with patient  . Evaluation of current treatment plan related to diabetes disease management and patient's adherence to plan as established by provider . Positive reinforcement given to patient for lowering her A1C down to 6.1 obtained on 11/25; discussed patient is adhering to Meal planning and portion control  . Determined patient is adhering to taking her medications as prescribed; Ozempic 0.8m weekly injection, ActoPlus-Met 15-850 mg 1 tablet daily  . Patient encouraged to continue Self monitoring her CBG's daily before meals and report abnormal readings to the CCM team and or PCP promptly . Discussed plans with patient for ongoing care management follow up and provided patient with direct contact information for care management team  Patient Self Care Activities:  . Self administers medications as prescribed . Attends all scheduled provider appointments . Calls pharmacy for medication refills . Attends church or other social activities . Performs ADL's independently . Performs IADL's independently . Calls provider office for new concerns or questions  Please see past updates related to this goal by clicking on the "Past  Updates" button in the selected goal      . "to keep my BP under control" (pt-stated)       Current Barriers:  .Marland KitchenKnowledge Deficits related to Hypertension  . Chronic Disease Management support and education needs related to Hypertension   Nurse Case Manager Clinical Goal(s):  .Marland KitchenOver the next 90 days, patient will work with CCM team  to address needs related to support and education related to Hypertension   CCM RN CM Interventions:  07/04/19 call completed with patient  . Evaluation of current treatment plan related to Hypertension and patient's adherence to plan as established by provider. . Advised patient to check with her health plan OTC benefit guide to inquire about purchasing a BP cuff for home use . Reviewed medications with patient and discussed patient is adhering to taking her BP meds exactly as prescribed . Discussed plans with patient for ongoing care management follow up and provided patient with direct contact information for care management team . Advised patient, providing education and rationale, to monitor blood pressure daily and record, calling the CCM team and or PCP for findings outside established parameters.  . Provided patient with printed educational materials related to What is High Blood Pressure, Why Should I restrict Sodium?; African-Americans and High Blood Pressure: Life's Simple 7  Patient Self Care Activities:  . Self administers medications as prescribed . Attends all scheduled provider appointments . Calls pharmacy for medication refills . Performs ADL's independently . Performs IADL's independently . Calls provider office for new concerns or questions  Initial goal documentation     . COMPLETED: Complete antibiotic for UTI (pt-stated)  Current Barriers:  [name] Knowledge Deficits related to UTI and antibiotic  Pharmacist Clinical Goal(s):  [name] Over the next 10 days, patient will demonstrate improved understanding of prescribed medications and  rationale for usage as evidenced by completion of antibiotic for UTI  CCM RN CM Interventions: 07/05/19 call completed  . Confirmed patient started Macrobid Wednesday 06/26/19 and completed the full course of treatment for UTI . Instructed patient to report s/s of recurrent UTI as soon as detected for early treatment and prevention of severe UTI and or hospitalization . Patient verbalizes understanding and denies having current symptoms since completion of her antibiotic  Patient Self Care Activities:  . Patient verbalizes understanding of plan to complete therapy for UTI . Self administers medications as prescribed  Please see past updates related to this goal by clicking on the "Past Updates" button in the selected goal         The patient verbalized understanding of instructions provided today and declined a print copy of patient instruction materials.   Telephone follow up appointment with care management team member scheduled for: 08/22/19  Barb Merino, RN, BSN, CCM Care Management Coordinator [place] [facility]  Direct Phone: [phone]

## 2019-07-11 ENCOUNTER — Inpatient Hospital Stay: Payer: Medicare Other

## 2019-07-15 ENCOUNTER — Inpatient Hospital Stay: Payer: Medicare Other | Attending: Oncology

## 2019-07-15 ENCOUNTER — Other Ambulatory Visit: Payer: Self-pay

## 2019-07-15 DIAGNOSIS — C50412 Malignant neoplasm of upper-outer quadrant of left female breast: Secondary | ICD-10-CM | POA: Diagnosis not present

## 2019-07-15 DIAGNOSIS — Z95828 Presence of other vascular implants and grafts: Secondary | ICD-10-CM

## 2019-07-15 DIAGNOSIS — D0511 Intraductal carcinoma in situ of right breast: Secondary | ICD-10-CM | POA: Insufficient documentation

## 2019-07-15 DIAGNOSIS — Z452 Encounter for adjustment and management of vascular access device: Secondary | ICD-10-CM | POA: Diagnosis not present

## 2019-07-15 DIAGNOSIS — C773 Secondary and unspecified malignant neoplasm of axilla and upper limb lymph nodes: Secondary | ICD-10-CM | POA: Diagnosis not present

## 2019-07-15 MED ORDER — SODIUM CHLORIDE 0.9% FLUSH
10.0000 mL | Freq: Once | INTRAVENOUS | Status: AC
  Administered 2019-07-15: 10 mL
  Filled 2019-07-15: qty 10

## 2019-07-15 MED ORDER — HEPARIN SOD (PORK) LOCK FLUSH 100 UNIT/ML IV SOLN
250.0000 [IU] | Freq: Once | INTRAVENOUS | Status: AC
  Administered 2019-07-15: 500 [IU]
  Filled 2019-07-15: qty 5

## 2019-07-15 NOTE — Patient Instructions (Signed)

## 2019-07-16 ENCOUNTER — Other Ambulatory Visit: Payer: Self-pay

## 2019-07-16 ENCOUNTER — Ambulatory Visit: Payer: Self-pay | Admitting: Pharmacist

## 2019-07-16 DIAGNOSIS — E1169 Type 2 diabetes mellitus with other specified complication: Secondary | ICD-10-CM

## 2019-07-16 DIAGNOSIS — I10 Essential (primary) hypertension: Secondary | ICD-10-CM

## 2019-07-16 DIAGNOSIS — F319 Bipolar disorder, unspecified: Secondary | ICD-10-CM

## 2019-07-16 DIAGNOSIS — C50412 Malignant neoplasm of upper-outer quadrant of left female breast: Secondary | ICD-10-CM

## 2019-07-16 DIAGNOSIS — Z794 Long term (current) use of insulin: Secondary | ICD-10-CM

## 2019-07-16 MED ORDER — LIDOCAINE-PRILOCAINE 2.5-2.5 % EX CREA: TOPICAL_CREAM | CUTANEOUS | 1 refills | Status: AC | PRN

## 2019-07-16 NOTE — Patient Instructions (Signed)
Visit Information  Goals Addressed            This Visit's Progress     Patient Stated   . I would like financial assistance with some of my medications (pt-stated)       Current Barriers:  . Non Adherence to prescribed medication regimen . Financial Barriers  Pharmacist Clinical Goal(s):  [name] Over the next 60 days, patient will work with CCM pharmacist to address needs related to applying for financial assistance for Ozempic.      Interventions: . Advised patient to continue taking medications as prescribed by provider. Patient takes Ozempic on Fridays . Application for Ozempic patient assistance to be mailed to patient & sent to provider. [employer] CPhT, Etter Sjogren assisting with PAP process for 2021  . Patient has ben approved for Ozempic until 07/18/2019.  Will submit application for Ozempic 2021 approval . Discussed plans with patient for ongoing care management follow up and provided patient with direct contact information for care management team . Collaboration with provider re: medication management . 07/16/19-patient mailing back forms, assisted patient with completion  Patient Self Care Activities:  . Self administers medications as prescribed . Attends all scheduled provider appointments . Calls pharmacy for medication refills  Please see past updates related to this goal by clicking on the "Past Updates" button in the selected goal      . I would like to improve my diabetes management (pt-stated)       Current Barriers:  . Non Adherence to prescribed medication regimen--dispense report does not match with prescribed regimen.  Will continue to assess compliance.  Pharmacist Clinical Goal(s):  [name] Over the next 90 days, patient will demonstrate Improved medication adherence as evidenced by on time refills . Over the next 90 days, patient will work with PharmD and PCP to optimize medication management of chronic conditions.  Interventions: Call completed with patient on  07/16/19 . Comprehensive medication review performed.   . Reviewed & discussed the following diabetes-related information with patient: o Continue checking blood sugars as directed o Follow ADA recommended "diabetes-friendly" diet  (reviewed healthy snack/food options) o Confirmed current DM regimen: Ozempic 0.5mg  weekly  Will continue to optimize DM regimen.  Patient wishes to continue on current regimen.   - Actoplus has been discontinued-->continue Ozempic (called pharmacy to d/c RX) o Discussed GLP-1 injection technique; Patient uses AccuCheck glucometer o Reviewed medication purpose/side effects-->patient denies adverse events, denies hypoglycemia, reports FBG<135, most recently in the upper 110s.  Patient is motivated to continue the great progress. o Most recent A1c decreased from 7.6%-->6.1%.   o Continue taking all medications as prescribed by provider . Patient continues on statin, ASA, ARB as appropriate.  Statin last filled for 90-day supply (patient verbalizes 100% compliance/denies side effects.  Will follow up to address refills. o Refill called in for rosuvastatin (pt last filled in 02/2019) . All blood pressure medications were reviewed (amlodipine, hydralazine, metoprolol, losartan).  Patient needed refill of hydralazine.  Message sent to [place] (confirmed refill sent).  Encouraged patient to monitor BP since restarting new medications.  Patient states her BP has been doing okay.  She reports BP being around 120-140/80-90. Goal BP <130/80  Patient Self Care Activities:  . Self administers medications as prescribed . Attends all scheduled provider appointments . Calls pharmacy for medication refills  Please see past updates related to this goal by clicking on the "Past Updates" button in the selected goal  The patient verbalized understanding of instructions provided today and declined a print copy of patient instruction materials.   The care management team will reach  out to the patient again over the next 30 days.   SIGNATURE Regina Eck, PharmD, BCPS Clinical Pharmacist, [facility] [place]: [phone]

## 2019-07-16 NOTE — Progress Notes (Signed)
Chronic Care Management    Visit Note  07/16/2019 Name: Kathy Maxwell MRN: [id number] DOB: March 16, 1956  Referred by: [name], FNP Reason for referral : Chronic Care Management   Kathy Maxwell is a 63 y.o. year old female who is a primary care patient of [name], [place]. The CCM team was consulted for assistance with chronic disease management and care coordination needs related to HTN and DMII  Review of patient status, including review of consultants reports, relevant laboratory and other test results, and collaboration with appropriate care team members and the patient's provider was performed as part of comprehensive patient evaluation and provision of chronic care management services.    I spoke with [name] by telephone today.  Medications: Outpatient Encounter Medications as of 07/16/2019  Medication Sig  . amLODipine (NORVASC) 10 MG tablet TAKE 1 TABLET BY MOUTH EVERY DAY  . anastrozole (ARIMIDEX) 1 MG tablet Take 1 tablet (1 mg total) by mouth daily.  [name] buPROPion (WELLBUTRIN SR) 150 MG 12 hr tablet TAKE 1 TABLET BY MOUTH EVERY DAY  . cholecalciferol (VITAMIN D3) 25 MCG (1000 UT) tablet Take 1 tablet (1,000 Units total) by mouth daily.  [name] gabapentin (NEURONTIN) 100 MG capsule TAKE 1 CAPSULE BY MOUTH 3 TIMES A DAY  . hydrALAZINE (APRESOLINE) 25 MG tablet Take 1 tablet (25 mg total) by mouth 3 (three) times daily.  [name] lidocaine-prilocaine (EMLA) cream Apply topically as needed. Apply to port-a-cath one - two hours before accessing.  [name] losartan (COZAAR) 100 MG tablet Take 1 tablet (100 mg total) by mouth daily.  . metoprolol succinate (TOPROL-XL) 25 MG 24 hr tablet TAKE 1 TABLET BY MOUTH EVERY DAY  . naproxen (NAPROSYN) 500 MG tablet TAKE 1 TABLET BY MOUTH TWICE A DAY AS NEEDED  . ONETOUCH VERIO test strip CHECK BLOOD SUGAR TWICE A DAY BEFORE BREAKFAST AND DINNER  . pantoprazole (PROTONIX) 40 MG tablet Take 1 tablet (40 mg total) by mouth daily.  . rosuvastatin  (CRESTOR) 20 MG tablet TAKE 1 TABLET BY MOUTH EVERY DAY  . Semaglutide,0.25 or 0.'5MG'$ /DOS, (OZEMPIC, 0.25 OR 0.5 MG/DOSE,) 2 MG/1.5ML SOPN Inject 0.5 mg into the skin once a week.  . [DISCONTINUED] nitrofurantoin, macrocrystal-monohydrate, (MACROBID) 100 MG capsule Take 1 capsule (100 mg total) by mouth 2 (two) times daily.  . [DISCONTINUED] pioglitazone-metformin (ACTOPLUS MET) 15-850 MG tablet TAKE 1 TABLET BY MOUTH EVERY DAY   Facility-Administered Encounter Medications as of 07/16/2019  Medication  . sodium chloride 0.9 % injection 10 mL     Objective:   Goals Addressed            This Visit's Progress     Patient Stated   . I would like financial assistance with some of my medications (pt-stated)       Current Barriers:  . Non Adherence to prescribed medication regimen . Financial Barriers  Pharmacist Clinical Goal(s):  [name] Over the next 60 days, patient will work with CCM pharmacist to address needs related to applying for financial assistance for Ozempic.      Interventions: . Advised patient to continue taking medications as prescribed by provider. Patient takes Ozempic on Fridays . Application for Ozempic patient assistance to be mailed to patient & sent to provider. [employer] CPhT, Etter Sjogren assisting with PAP process for 2021  . Patient has ben approved for Ozempic until 07/18/2019.  Will submit application for Ozempic 2021 approval . Discussed plans with patient for ongoing care management follow up and provided patient  with direct contact information for care management team . Collaboration with provider re: medication management . 07/16/19-patient mailing back forms, assisted patient with completion  Patient Self Care Activities:  . Self administers medications as prescribed . Attends all scheduled provider appointments . Calls pharmacy for medication refills  Please see past updates related to this goal by clicking on the "Past Updates" button in the selected goal        . I would like to improve my diabetes management (pt-stated)       Current Barriers:  . Non Adherence to prescribed medication regimen--dispense report does not match with prescribed regimen.  Will continue to assess compliance.  Pharmacist Clinical Goal(s):  [name] Over the next 90 days, patient will demonstrate Improved medication adherence as evidenced by on time refills . Over the next 90 days, patient will work with PharmD and PCP to optimize medication management of chronic conditions.  Interventions: Call completed with patient on 07/16/19 . Comprehensive medication review performed.   . Reviewed & discussed the following diabetes-related information with patient: o Continue checking blood sugars as directed o Follow ADA recommended "diabetes-friendly" diet  (reviewed healthy snack/food options) o Confirmed current DM regimen: Ozempic 0.'5mg'$  weekly  Will continue to optimize DM regimen.  Patient wishes to continue on current regimen.   - Actoplus has been discontinued-->continue Ozempic (called pharmacy to d/c RX) o Discussed GLP-1 injection technique; Patient uses AccuCheck glucometer o Reviewed medication purpose/side effects-->patient denies adverse events, denies hypoglycemia, reports FBG<135, most recently in the upper 110s.  Patient is motivated to continue the great progress. o Most recent A1c decreased from 7.6%-->6.1%.   o Continue taking all medications as prescribed by provider . Patient continues on statin, ASA, ARB as appropriate.  Statin last filled for 90-day supply (patient verbalizes 100% compliance/denies side effects.  Will follow up to address refills. o Refill called in for rosuvastatin (pt last filled in 02/2019) . All blood pressure medications were reviewed (amlodipine, hydralazine, metoprolol, losartan).  Patient needed refill of hydralazine.  Message sent to [place] (confirmed refill sent).  Encouraged patient to monitor BP since restarting new medications.   Patient states her BP has been doing okay.  She reports BP being around 120-140/80-90. Goal BP <130/80  Patient Self Care Activities:  . Self administers medications as prescribed . Attends all scheduled provider appointments . Calls pharmacy for medication refills  Please see past updates related to this goal by clicking on the "Past Updates" button in the selected goal          Plan:   The care management team will reach out to the patient again over the next 30 days.   Provider Signature Regina Eck, PharmD, BCPS Clinical Pharmacist, [facility] [place]: [phone]

## 2019-07-18 ENCOUNTER — Other Ambulatory Visit: Payer: Self-pay | Admitting: Nurse Practitioner

## 2019-07-23 ENCOUNTER — Telehealth: Payer: Self-pay

## 2019-07-23 ENCOUNTER — Other Ambulatory Visit: Payer: Self-pay | Admitting: Pharmacy Technician

## 2019-07-23 NOTE — Patient Outreach (Signed)
[facility]) Care Management  07/23/2019  Kathy Maxwell 1955/12/02 [id number]   Received patient portion(s) of patient assistance application(s) for Ozempic. Faxed completed application and required documents into [employer].  Will follow up with company(ies) in 10-14 business days to check status of application(s).  Maud Deed Chana Bode [place] Certified Pharmacy Technician [facility] Direct Dial:[phone]

## 2019-07-24 ENCOUNTER — Telehealth: Payer: Self-pay

## 2019-07-30 ENCOUNTER — Ambulatory Visit (INDEPENDENT_AMBULATORY_CARE_PROVIDER_SITE_OTHER): Payer: Medicare Other | Admitting: Neurology

## 2019-07-30 DIAGNOSIS — G620 Drug-induced polyneuropathy: Secondary | ICD-10-CM

## 2019-07-30 DIAGNOSIS — I5032 Chronic diastolic (congestive) heart failure: Secondary | ICD-10-CM

## 2019-07-30 DIAGNOSIS — E1142 Type 2 diabetes mellitus with diabetic polyneuropathy: Secondary | ICD-10-CM

## 2019-07-30 DIAGNOSIS — T451X5A Adverse effect of antineoplastic and immunosuppressive drugs, initial encounter: Secondary | ICD-10-CM

## 2019-07-30 DIAGNOSIS — R351 Nocturia: Secondary | ICD-10-CM

## 2019-07-30 DIAGNOSIS — R0683 Snoring: Secondary | ICD-10-CM

## 2019-07-30 DIAGNOSIS — G4719 Other hypersomnia: Secondary | ICD-10-CM

## 2019-07-30 DIAGNOSIS — G4710 Hypersomnia, unspecified: Secondary | ICD-10-CM | POA: Diagnosis not present

## 2019-07-30 DIAGNOSIS — E1140 Type 2 diabetes mellitus with diabetic neuropathy, unspecified: Secondary | ICD-10-CM

## 2019-07-31 ENCOUNTER — Telehealth: Payer: Self-pay

## 2019-07-31 ENCOUNTER — Ambulatory Visit: Payer: Self-pay

## 2019-07-31 DIAGNOSIS — Z794 Long term (current) use of insulin: Secondary | ICD-10-CM

## 2019-07-31 DIAGNOSIS — I10 Essential (primary) hypertension: Secondary | ICD-10-CM

## 2019-07-31 DIAGNOSIS — E1169 Type 2 diabetes mellitus with other specified complication: Secondary | ICD-10-CM

## 2019-08-01 ENCOUNTER — Ambulatory Visit (INDEPENDENT_AMBULATORY_CARE_PROVIDER_SITE_OTHER): Payer: Medicare Other

## 2019-08-01 ENCOUNTER — Telehealth: Payer: Self-pay

## 2019-08-01 DIAGNOSIS — Z794 Long term (current) use of insulin: Secondary | ICD-10-CM

## 2019-08-01 DIAGNOSIS — I10 Essential (primary) hypertension: Secondary | ICD-10-CM | POA: Diagnosis not present

## 2019-08-01 DIAGNOSIS — E1169 Type 2 diabetes mellitus with other specified complication: Secondary | ICD-10-CM | POA: Diagnosis not present

## 2019-08-01 NOTE — Chronic Care Management (AMB) (Signed)
Chronic Care Management   Follow Up Note   07/31/2019 Name: Kathy Maxwell MRN: [id number] DOB: March 31, 1956  Referred by: Kathy Brine, FNP Reason for referral : Chronic Care Management (CCM RN CM Telephone Follow up)   Kathy Maxwell is a 64 y.o. year old female who is a primary care patient of Kathy Maxwell, [place]. The CCM team was consulted for assistance with chronic disease management and care coordination needs.    Review of patient status, including review of consultants reports, relevant laboratory and other test results, and collaboration with appropriate care team members and the patient's provider was performed as part of comprehensive patient evaluation and provision of chronic care management services.    SDOH (Social Determinants of Health) screening performed today: None. See Care Plan for related entries.   Inbound call received from patient requesting assistance with helping her learn what her OTC benefits are through [facility].   Outpatient Encounter Medications as of 07/31/2019  Medication Sig  . amLODipine (NORVASC) 10 MG tablet TAKE 1 TABLET BY MOUTH EVERY DAY  . anastrozole (ARIMIDEX) 1 MG tablet Take 1 tablet (1 mg total) by mouth daily.  [name] buPROPion (WELLBUTRIN SR) 150 MG 12 hr tablet TAKE 1 TABLET BY MOUTH EVERY DAY  . cholecalciferol (VITAMIN D3) 25 MCG (1000 UT) tablet Take 1 tablet (1,000 Units total) by mouth daily.  [name] gabapentin (NEURONTIN) 100 MG capsule TAKE 1 CAPSULE BY MOUTH 3 TIMES A DAY  . hydrALAZINE (APRESOLINE) 25 MG tablet TAKE 1 TABLET BY MOUTH THREE TIMES A DAY  . lidocaine-prilocaine (EMLA) cream Apply topically as needed. Apply to port-a-cath one - two hours before accessing.  [name] losartan (COZAAR) 100 MG tablet Take 1 tablet (100 mg total) by mouth daily.  . metoprolol succinate (TOPROL-XL) 25 MG 24 hr tablet TAKE 1 TABLET BY MOUTH EVERY DAY  . naproxen (NAPROSYN) 500 MG tablet TAKE 1 TABLET BY MOUTH TWICE A DAY AS NEEDED  . ONETOUCH VERIO test strip  CHECK BLOOD SUGAR TWICE A DAY BEFORE BREAKFAST AND DINNER  . pantoprazole (PROTONIX) 40 MG tablet Take 1 tablet (40 mg total) by mouth daily.  . rosuvastatin (CRESTOR) 20 MG tablet TAKE 1 TABLET BY MOUTH EVERY DAY  . Semaglutide,0.25 or 0.5MG /DOS, (OZEMPIC, 0.25 OR 0.5 MG/DOSE,) 2 MG/1.5ML SOPN Inject 0.5 mg into the skin once a week.   Facility-Administered Encounter Medications as of 07/31/2019  Medication  . sodium chloride 0.9 % injection 10 mL     Goals Addressed      Patient Stated   . "to learn about my OTC benefit" (pt-stated)       Current Barriers:  [name] Knowledge Deficits related to [facility] OTC benefit . Chronic Disease Management support and education needs related to DMII, HTN  Nurse Case Manager Clinical Goal(s):  [name] Over the next 30 days, patient will work with the CCM team to address needs related to [facility] OTC benefits  CCM RN CM Interventions:  07/31/19 Inbound call from patient with questions about her OTC benefits . Determined [name] was told by a CSR with [employer] that her OTC benefit expired but she is unsure if this is the correct  . Discussed having the embedded BSW Kathy Maxwell give her a call to assist further as she is more familiar . Collaborated with [place] regarding patient needing assisting with navigating her health plan benefit coverage, specifically for for OTC . Discussed plans with patient for ongoing care management follow up and provided patient  with direct contact information for care management team  Patient Self Care Activities:  . Patient verbalizes understanding of plan to speak with the embedded BSW Kathy Maxwell to assist with knowledge deficit related to her OTC health benefit through [facility]  . Self administers medications as prescribed . Attends all scheduled provider appointments . Calls pharmacy for medication refills . Performs ADL's independently . Performs IADL's independently . Calls provider office for new concerns or  questions  Initial goal documentation         Telephone follow up appointment with care management team member scheduled for: 08/22/19  Kathy Merino, RN, BSN, CCM Care Management Coordinator [place] [facility]  Direct Phone: [phone]

## 2019-08-01 NOTE — Telephone Encounter (Signed)
Pt notified that medication from PAP was in and ready for pickup

## 2019-08-01 NOTE — Patient Instructions (Signed)
Social Worker Visit Information  Goals we discussed today:  Goals Addressed            This Visit's Progress     Patient Stated   . COMPLETED: "to learn about my OTC benefit" (pt-stated)       Current Barriers:  [name] Knowledge Deficits related to [facility] OTC benefit . Chronic Disease Management support and education needs related to DMII, HTN  Nurse Case Manager Clinical Goal(s):  [name] Over the next 30 days, patient will work with the CCM team to address needs related to [facility] OTC benefits  CCM SW Interventions Completed 08/01/19 . Outbound call placed to the patient to assist with OTC benefit information . Assisted in contacting the patients health plan to review benefits . Determined the patient does not have OTC benefits covered under her selected health plan . Assessed for acute care coordination needs to assist with management of DMII and HTN - no acute needs identified at this time . Collaboration with care team to communicate above intervention . Goal met  CCM RN CM Interventions:  07/31/19 Inbound call from patient with questions about her OTC benefits . Determined Ms. Fellman was told by a CSR with [employer] that her OTC benefit expired but she is unsure if this is the correct  . Discussed having the embedded BSW Daneen Schick give her a call to assist further as she is more familiar . Collaborated with [place] regarding patient needing assisting with navigating her health plan benefit coverage, specifically for for OTC . Discussed plans with patient for ongoing care management follow up and provided patient with direct contact information for care management team  Patient Self Care Activities:  . Patient verbalizes understanding of plan to speak with the embedded BSW Daneen Schick to assist with knowledge deficit related to her OTC health benefit through [facility]  . Self administers medications as prescribed . Attends all scheduled provider appointments . Calls pharmacy for  medication refills . Performs ADL's independently . Performs IADL's independently . Calls provider office for new concerns or questions  Please see past updates related to this goal by clicking on the "Past Updates" button in the selected goal           Follow Up Plan: No further SW follow up planned at this time. Please contact me with future resource needs!   Daneen Schick, BSW, CDP Social Worker, Certified Dementia Practitioner [place] [facility] [place] [facility] [phone]

## 2019-08-01 NOTE — Patient Instructions (Signed)
Visit Information  Goals Addressed      Patient Stated   . "to learn about my OTC benefit" (pt-stated)       Current Barriers:  [name] Knowledge Deficits related to [facility] OTC benefit . Chronic Disease Management support and education needs related to DMII, HTN  Nurse Case Manager Clinical Goal(s):  [name] Over the next 30 days, patient will work with the CCM team to address needs related to [facility] OTC benefits  CCM RN CM Interventions:  07/31/19 Inbound call from patient with questions about her OTC benefits . Determined Ms. Mapa was told by a CSR with [employer] that her OTC benefit expired but she is unsure if this is the correct  . Discussed having the embedded BSW Daneen Schick give her a call to assist further as she is more familiar . Collaborated with [place] regarding patient needing assisting with navigating her health plan benefit coverage, specifically for for OTC . Discussed plans with patient for ongoing care management follow up and provided patient with direct contact information for care management team  Patient Self Care Activities:  . Patient verbalizes understanding of plan to speak with the embedded BSW Daneen Schick to assist with knowledge deficit related to her OTC health benefit through [facility]  . Self administers medications as prescribed . Attends all scheduled provider appointments . Calls pharmacy for medication refills . Performs ADL's independently . Performs IADL's independently . Calls provider office for new concerns or questions  Initial goal documentation        The patient verbalized understanding of instructions provided today and declined a print copy of patient instruction materials.   Telephone follow up appointment with care management team member scheduled for:  08/22/19  Barb Merino, RN, BSN, CCM Care Management Coordinator [place] [facility]  Direct Phone: [phone]

## 2019-08-01 NOTE — Chronic Care Management (AMB) (Signed)
Chronic Care Management    Social Work Follow Up Note  08/01/2019 Name: Kathy Maxwell MRN: [id number] DOB: 1955/12/30  Kathy Maxwell is a 64 y.o. year old female who is a primary care patient of [name], [place]. The CCM team was consulted for assistance with care coordination.   Review of patient status, including review of consultants reports, other relevant assessments, and collaboration with appropriate care team members and the patient's provider was performed as part of comprehensive patient evaluation and provision of chronic care management services.    SW placed a successful outbound call to the patient to assist with care coordination.  Outpatient Encounter Medications as of 08/01/2019  Medication Sig  . amLODipine (NORVASC) 10 MG tablet TAKE 1 TABLET BY MOUTH EVERY DAY  . anastrozole (ARIMIDEX) 1 MG tablet Take 1 tablet (1 mg total) by mouth daily.  [name] buPROPion (WELLBUTRIN SR) 150 MG 12 hr tablet TAKE 1 TABLET BY MOUTH EVERY DAY  . cholecalciferol (VITAMIN D3) 25 MCG (1000 UT) tablet Take 1 tablet (1,000 Units total) by mouth daily.  [name] gabapentin (NEURONTIN) 100 MG capsule TAKE 1 CAPSULE BY MOUTH 3 TIMES A DAY  . hydrALAZINE (APRESOLINE) 25 MG tablet TAKE 1 TABLET BY MOUTH THREE TIMES A DAY  . lidocaine-prilocaine (EMLA) cream Apply topically as needed. Apply to port-a-cath one - two hours before accessing.  [name] losartan (COZAAR) 100 MG tablet Take 1 tablet (100 mg total) by mouth daily.  . metoprolol succinate (TOPROL-XL) 25 MG 24 hr tablet TAKE 1 TABLET BY MOUTH EVERY DAY  . naproxen (NAPROSYN) 500 MG tablet TAKE 1 TABLET BY MOUTH TWICE A DAY AS NEEDED  . ONETOUCH VERIO test strip CHECK BLOOD SUGAR TWICE A DAY BEFORE BREAKFAST AND DINNER  . pantoprazole (PROTONIX) 40 MG tablet Take 1 tablet (40 mg total) by mouth daily.  . rosuvastatin (CRESTOR) 20 MG tablet TAKE 1 TABLET BY MOUTH EVERY DAY  . Semaglutide,0.25 or 0.'5MG'$ /DOS, (OZEMPIC, 0.25 OR 0.5 MG/DOSE,) 2 MG/1.5ML SOPN  Inject 0.5 mg into the skin once a week.   Facility-Administered Encounter Medications as of 08/01/2019  Medication  . sodium chloride 0.9 % injection 10 mL     Goals Addressed            This Visit's Progress     Patient Stated   . COMPLETED: "to learn about my OTC benefit" (pt-stated)       Current Barriers:  [name] Knowledge Deficits related to [facility] OTC benefit . Chronic Disease Management support and education needs related to DMII, HTN  Nurse Case Manager Clinical Goal(s):  [name] Over the next 30 days, patient will work with the CCM team to address needs related to [facility] OTC benefits  CCM SW Interventions Completed 08/01/19 . Outbound call placed to the patient to assist with OTC benefit information . Assisted in contacting the patients health plan to review benefits . Determined the patient does not have OTC benefits covered under her selected health plan . Assessed for acute care coordination needs to assist with management of DMII and HTN - no acute needs identified at this time . Collaboration with care team to communicate above intervention . Goal met  CCM RN CM Interventions:  07/31/19 Inbound call from patient with questions about her OTC benefits . Determined Ms. Hussey was told by a CSR with [employer] that her OTC benefit expired but she is unsure if this is the correct  . Discussed having the embedded BSW Daneen Schick give her  a call to assist further as she is more familiar . Collaborated with [place] regarding patient needing assisting with navigating her health plan benefit coverage, specifically for for OTC . Discussed plans with patient for ongoing care management follow up and provided patient with direct contact information for care management team  Patient Self Care Activities:  . Patient verbalizes understanding of plan to speak with the embedded BSW Daneen Schick to assist with knowledge deficit related to her OTC health benefit through [facility]  . Self  administers medications as prescribed . Attends all scheduled provider appointments . Calls pharmacy for medication refills . Performs ADL's independently . Performs IADL's independently . Calls provider office for new concerns or questions  Please see past updates related to this goal by clicking on the "Past Updates" button in the selected goal          Follow Up Plan: No further SW follow up planned at this time. The patient will remain active with RN Case Manager and embedded PharmD.   Daneen Schick, BSW, CDP Social Worker, Certified Dementia Practitioner [place] [facility] [place] [facility] [phone]  Total time spent performing care coordination and/or care management activities with the patient by phone or face to face = 25 minutes.

## 2019-08-09 ENCOUNTER — Other Ambulatory Visit: Payer: Self-pay | Admitting: Pharmacy Technician

## 2019-08-09 NOTE — Patient Outreach (Signed)
[place] [facility]) Care Management  08/09/2019  Kathy Maxwell March 15, 1956 [id number]    Follow up call placed to [employer] regarding patient assistance application(s) for Ozempic , Olivia Mackie confirms patient has been approved as of 1/10 until 07/17/2020. Medication delivered to [facility] on 07/31/2019  Follow up:  Will remove myself from care team and  inform [place] Chana Bode [place] Certified Pharmacy Technician [place] [facility] Direct Dial:[phone]

## 2019-08-10 ENCOUNTER — Other Ambulatory Visit: Payer: Self-pay | Admitting: Nurse Practitioner

## 2019-08-13 NOTE — Procedures (Signed)
PATIENT'S NAME:  Kathy Maxwell, Kathy Maxwell DOB:      October 06, 1955      MR#:    [id number]     DATE OF RECORDING: 07/30/2019 REFERRING M.D.:  [name] FNP Study Performed:   Baseline Polysomnogram HISTORY:  64 y.o. year old African American female patient, seen on 05/06/2019, for hypersomnia.  I have the pleasure of seeing Kathy Maxwell today, a right -handed African American female with a medical history of Allergy, Arthritis, Bipolar 1 disorder ([place]),  (04/04/2013), Blood transfusion without reported diagnosis, chronic diastolic heart failure. Carpal tunnel, surgery. Breast cancer ([place]) (03/10/13), Personal history of chemotherapy, Personal history of radiation therapy, Radiation (01/30/14-03/19/14), and Sleep apnea, Coronary artery disease, Diabetes mellitus without complication ([place]), GERD (gastroesophageal reflux disease), Heart murmur,  Hyperlipidemia (04/04/2013),  Hypertension (04/04/2013), Morbid Obesity.   The patient had the first sleep study " somewhere on [place]" before her cancer diagnosis. She was diagnosed with sleep apnea.  Sleep relevant medical history: Nocturia- up 3-4 times, Family sleep history:  Her sister has OSA.   Social history: Patient is retired from [employer], worked day and night shifts. Family status is single, without children.   The patient endorsed the Epworth Sleepiness Scale at 17/ 24 points.   The patient's weight 207 pounds with a height of 62 (inches), resulting in a BMI of 38.1 kg/m2. The patient's neck circumference measured 15.25 inches.  CURRENT MEDICATIONS: Norvasc, Arimidex, Wellbutrin, Neurontin, Apresoline, Cozaar, Toprol, Naproxen, Protonix, Crestor, Ozempic   PROCEDURE:  This is a multichannel digital polysomnogram utilizing the Somnostar 11.2 system.  Electrodes and sensors were applied and monitored per AASM Specifications.   EEG, EOG, Chin and Limb EMG, were sampled at 200 Hz.  ECG, Snore and Nasal Pressure, Thermal Airflow, Respiratory Effort, CPAP Flow  and Pressure, Oximetry was sampled at 50 Hz. Digital video and audio were recorded.      BASELINE STUDY: Lights Out was at 21:25 and Lights On at 05:01.  Total recording time (TRT) was 457 minutes, with a total sleep time (TST) of 426 minutes.   The patient's sleep latency was 14 minutes.  REM latency was 12.5 minutes.  The sleep efficiency was 93.2 %.     SLEEP ARCHITECTURE: WASO (Wake after sleep onset) was 25 minutes.  There were 24 minutes in Stage N1, 160.5 minutes Stage N2, 98.5 minutes Stage N3 and 143 minutes in Stage REM.  The percentage of Stage N1 was 5.6%, Stage N2 was 37.7%, Stage N3 was 23.1% and Stage R (REM sleep) was 33.6%.  The arousals were noted as: 39 were spontaneous, 1 were associated with PLMs, 3 were associated with respiratory events.    RESPIRATORY ANALYSIS:  There were a total of 56 respiratory events:  5 obstructive apneas, 0 central apneas and 0 mixed apneas with a total of 5 apneas and an apnea index (AI) of .7 /hour. There were 51 hypopneas with a hypopnea index of 7.2 /hour. The patient also had 0 respiratory event related arousals (RERAs).      The total APNEA/HYPOPNEA INDEX (AHI) was 7.9/hour and the total RESPIRATORY DISTURBANCE INDEX was  7.9 /hour.  51 events occurred in REM sleep and 10 events in NREM. The REM AHI was  21.4 /hour, versus a non-REM AHI of 1.1. The patient spent 399.5 minutes of total sleep time in the supine position and 27 minutes in non-supine.. The supine AHI was 7.4 versus a non-supine AHI of 15.8.  OXYGEN SATURATION & C02:  The [facility]  baseline 02 saturation was 94%, with the lowest being 74%. Time spent below 89% saturation equaled 66 minutes.   The arousals were noted as: 39 were spontaneous, 1 were associated with PLMs, 3 were associated with respiratory events. The patient had a total of 6 Periodic Limb Movements.  The Periodic Limb Movement (PLM) index was .8 and the PLM Arousal index was .1/hour. Audio and video analysis did not show any  abnormal or unusual movements, behaviors, phonations or vocalizations.   Snoring was noted. EKG was in keeping with normal sinus rhythm (NSR).  IMPRESSION:  1. Mild Obstructive Sleep Apnea (OSA) at AHI 7.9/h but associated with repeated desaturation periods. 2. Prolonged hypoxemia- 66 minutes.  3. Very early REM onset.  4. Frequent spontaneous arousals, these may reflect pain, anxiety ,etc. .   RECOMMENDATIONS:  1. Advise full-night,attended, CPAP titration study to optimize therapy. I need to look at REM onset again, may need to correlate to HLA test in order to decide if a narcolepsy work up is required.   I certify that I have reviewed the entire raw data recording prior to the issuance of this report in accordance with the Standards of Accreditation of the American Academy of Sleep Medicine (AASM)   Larey Seat, MD Diplomat, American Board of Psychiatry and Neurology  Diplomat, American Board of Sleep Medicine Market researcher, [employer] [facility] at [employer], [employer] of Neurology and Sleep Medicine (Neurology and Sleep Medicine)

## 2019-08-13 NOTE — Addendum Note (Signed)
Addended by: Larey Seat on: 08/13/2019 09:12 AM   Modules accepted: Orders

## 2019-08-13 NOTE — Progress Notes (Signed)
IMPRESSION:   1. Mild Obstructive Sleep Apnea (OSA) at AHI 7.9/h but associated  with repeated desaturation periods.  2. Prolonged hypoxemia- 66 minutes.  3. Very early REM onset.  4. Frequent spontaneous arousals, these may reflect pain, anxiety  ,etc. .   RECOMMENDATIONS:   1. Advise full-night,attended, CPAP titration study to optimize  therapy. I need to look at REM onset again, may need to correlate  to HLA test in order to decide if a narcolepsy work up is  required.

## 2019-08-14 ENCOUNTER — Telehealth: Payer: Self-pay | Admitting: Neurology

## 2019-08-14 NOTE — Telephone Encounter (Signed)
Called patient to discuss sleep study results. No answer at this time. LVM for the patient to call back.   

## 2019-08-14 NOTE — Telephone Encounter (Signed)
-----  Message from Larey Seat, MD sent at 08/13/2019  9:12 AM EST ----- IMPRESSION:   1. Mild Obstructive Sleep Apnea (OSA) at AHI 7.9/h but associated  with repeated desaturation periods.  2. Prolonged hypoxemia- 66 minutes.  3. Very early REM onset.  4. Frequent spontaneous arousals, these may reflect pain, anxiety  ,etc. .   RECOMMENDATIONS:   1. Advise full-night,attended, CPAP titration study to optimize  therapy. I need to look at REM onset again, may need to correlate  to HLA test in order to decide if a narcolepsy work up is  required.

## 2019-08-22 ENCOUNTER — Telehealth: Payer: Self-pay

## 2019-08-22 NOTE — Telephone Encounter (Signed)
Made a 2nd attempt to reach out to the patient. There was no answer. LVM advising the pt to call back

## 2019-08-23 ENCOUNTER — Inpatient Hospital Stay: Payer: Medicare Other | Attending: Oncology

## 2019-08-23 ENCOUNTER — Telehealth: Payer: Self-pay

## 2019-08-23 ENCOUNTER — Other Ambulatory Visit: Payer: Self-pay

## 2019-08-23 VITALS — BP 158/60 | HR 80 | Temp 98.5°F | Resp 18

## 2019-08-23 DIAGNOSIS — R599 Enlarged lymph nodes, unspecified: Secondary | ICD-10-CM | POA: Diagnosis not present

## 2019-08-23 DIAGNOSIS — C773 Secondary and unspecified malignant neoplasm of axilla and upper limb lymph nodes: Secondary | ICD-10-CM | POA: Insufficient documentation

## 2019-08-23 DIAGNOSIS — D0511 Intraductal carcinoma in situ of right breast: Secondary | ICD-10-CM | POA: Diagnosis not present

## 2019-08-23 DIAGNOSIS — Z452 Encounter for adjustment and management of vascular access device: Secondary | ICD-10-CM | POA: Diagnosis not present

## 2019-08-23 DIAGNOSIS — M25559 Pain in unspecified hip: Secondary | ICD-10-CM | POA: Insufficient documentation

## 2019-08-23 DIAGNOSIS — R160 Hepatomegaly, not elsewhere classified: Secondary | ICD-10-CM | POA: Diagnosis not present

## 2019-08-23 DIAGNOSIS — K5900 Constipation, unspecified: Secondary | ICD-10-CM | POA: Insufficient documentation

## 2019-08-23 DIAGNOSIS — C50412 Malignant neoplasm of upper-outer quadrant of left female breast: Secondary | ICD-10-CM

## 2019-08-23 DIAGNOSIS — R1013 Epigastric pain: Secondary | ICD-10-CM | POA: Diagnosis not present

## 2019-08-23 DIAGNOSIS — Z95828 Presence of other vascular implants and grafts: Secondary | ICD-10-CM

## 2019-08-23 DIAGNOSIS — Z170 Estrogen receptor positive status [ER+]: Secondary | ICD-10-CM

## 2019-08-23 MED ORDER — ONDANSETRON HCL 8 MG PO TABS: 8.0000 mg | ORAL_TABLET | Freq: Three times a day (TID) | ORAL | 0 refills | Status: AC | PRN

## 2019-08-23 MED ORDER — SODIUM CHLORIDE 0.9% FLUSH
10.0000 mL | Freq: Once | INTRAVENOUS | Status: AC
  Administered 2019-08-23: 14:00:00 10 mL
  Filled 2019-08-23: qty 10

## 2019-08-23 MED ORDER — HEPARIN SOD (PORK) LOCK FLUSH 100 UNIT/ML IV SOLN
500.0000 [IU] | Freq: Once | INTRAVENOUS | Status: AC
  Administered 2019-08-23: 500 [IU]
  Filled 2019-08-23: qty 5

## 2019-08-23 NOTE — Telephone Encounter (Signed)
Injection nurse notified this RN that patient was c/o nausea and would like medication if MD is in agreement.   RN spoke with patient, patient tolerating fluids and food.  Mild nausea, no emesis.    Verbal orders given from Zofran 8mg  PRN Q 8 hours from MD.  Medication sent to pharmacy, pt aware.

## 2019-08-23 NOTE — Patient Instructions (Signed)

## 2019-08-26 ENCOUNTER — Telehealth: Payer: Self-pay | Admitting: Pharmacist

## 2019-08-27 NOTE — Telephone Encounter (Signed)
Made a 3rd attempt to call the patient. No answer. LVM once more and will send a letter to the patient.

## 2019-08-29 ENCOUNTER — Telehealth: Payer: Self-pay | Admitting: *Deleted

## 2019-08-29 ENCOUNTER — Other Ambulatory Visit: Payer: Self-pay | Admitting: *Deleted

## 2019-08-29 DIAGNOSIS — C50412 Malignant neoplasm of upper-outer quadrant of left female breast: Secondary | ICD-10-CM

## 2019-08-29 DIAGNOSIS — Z7984 Long term (current) use of oral hypoglycemic drugs: Secondary | ICD-10-CM | POA: Diagnosis not present

## 2019-08-29 DIAGNOSIS — E119 Type 2 diabetes mellitus without complications: Secondary | ICD-10-CM | POA: Diagnosis not present

## 2019-08-29 DIAGNOSIS — R918 Other nonspecific abnormal finding of lung field: Secondary | ICD-10-CM | POA: Diagnosis not present

## 2019-08-29 DIAGNOSIS — I10 Essential (primary) hypertension: Secondary | ICD-10-CM | POA: Diagnosis not present

## 2019-08-29 DIAGNOSIS — Z170 Estrogen receptor positive status [ER+]: Secondary | ICD-10-CM

## 2019-08-29 DIAGNOSIS — Z853 Personal history of malignant neoplasm of breast: Secondary | ICD-10-CM | POA: Diagnosis not present

## 2019-08-29 DIAGNOSIS — R9431 Abnormal electrocardiogram [ECG] [EKG]: Secondary | ICD-10-CM | POA: Diagnosis not present

## 2019-08-29 DIAGNOSIS — R590 Localized enlarged lymph nodes: Secondary | ICD-10-CM | POA: Diagnosis not present

## 2019-08-29 DIAGNOSIS — C787 Secondary malignant neoplasm of liver and intrahepatic bile duct: Secondary | ICD-10-CM | POA: Diagnosis not present

## 2019-08-29 DIAGNOSIS — R079 Chest pain, unspecified: Secondary | ICD-10-CM | POA: Diagnosis not present

## 2019-08-29 DIAGNOSIS — R160 Hepatomegaly, not elsewhere classified: Secondary | ICD-10-CM | POA: Diagnosis not present

## 2019-08-29 DIAGNOSIS — C778 Secondary and unspecified malignant neoplasm of lymph nodes of multiple regions: Secondary | ICD-10-CM

## 2019-08-29 DIAGNOSIS — C50919 Malignant neoplasm of unspecified site of unspecified female breast: Secondary | ICD-10-CM | POA: Diagnosis not present

## 2019-08-29 DIAGNOSIS — I517 Cardiomegaly: Secondary | ICD-10-CM | POA: Diagnosis not present

## 2019-08-29 NOTE — Telephone Encounter (Signed)
This RN spoke with pt per her call stating she went to the ER in [facility] and had scans done - " and they said I needed to see Dr Jana Hakim asap "  This RN obtained records per Care Everywhere of ER visit with CT showing area of concern in chest as well as abnormal labs.  Per review by MD- this RN informed pt request for visit tomorrow- with pt in agreement- this RN scheduled pt for lab and MD.  No further questions or needs at this time.

## 2019-08-29 NOTE — Progress Notes (Signed)
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  [name] presented to the ED in [facility] yesterday, 08/29/2019, for ongoing chest and epigastric discomfort and was found to have concerning findings on her scans.  She underwent chest x-ray, which was suspicious for hilar lymphadenopathy.   She then underwent abdomen/pelvis CT, which revealed: hepatomegaly and lesions consistent with extensive hepatic metastatic disease and trace perihepatic ascites.  A chest CT obtained during that visit showed: large right hilar lymph node concerning for lymphatic spread.  [name] has been on anastrozole for a little over 5 years.  She has tolerated this well, with no unusual side effects and no significant hot flashes or vaginal dryness problems.  Her insurance covers the cost of this medication completely.   [name]'s had her most recent bone density screening on 04/03/2014, which showed a T-score of -1.4, mildly osteopenic.    Since her last visit, she underwent bilateral diagnostic mammography with tomography at [place] on 11/22/2018 showing: breast density category C; no evidence of malignancy in either breast.    REVIEW OF SYSTEMS: [name]'s pain was in the epigastric area.  She tells me she is taking Protonix and that it is not  helping.  She has significant hip pain which is chronic and she uses a cane to walk.  There have not been any recent falls, and she denies headaches, visual changes, nausea, vomiting, or gait imbalance.  She denies a cough or phlegm production.  There has been no fever bleeding or rash.  For pain she has been taking hydrocodone/Tylenol 5/325, with mild constipation.  A detailed review of systems today was otherwise stable.  BREAST CANCER HISTORY: From Dr. Bernell List Khan's original intake note, 04/04/2013:  "Kathy Maxwell is a 64 y.o. female. With other medical problems who underwent a screening mammogram that led to a diagnostic mammogram and ultrasound of the left breast. The mammogram and ultrasound showed a 2.9 x 1.6 x 1.9 cm suspicious mass in the left breast at the 12:30 oh clock position 6 cm from the nipple. There was also a suspicious left axillary lymph node. She had image guided biopsy of the left breast mass and the lymph node. The pathology revealed an invasive mammary carcinoma probably ductal phenotype. Tumor was estrogen receptor +100% progesterone receptor +98% proliferation marker Ki-67 13% and HER-2/neu negative. Patient had MRI of the breasts performed on 03/15/2013 the left breast revealed a 7.4 cm area of what the lymph node. Right breast revealed and a 3.3 cm mass. Patient was seen by Dr. Fanny Skates for discussion of surgical options. He has referred the patient to medical oncology for discussion whether or not she will need a Port-A-Cath and chemotherapy."  The patient underwent left modified radical mastectomy 04/22/2014 showing multiple foci of low-grade invasive ductal carcinoma, the largest single lesion measuring 5.5 cm, and involving 4/22 lymph nodes. Repeat HER-2 study was again  negative. 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 The patient proceeded to adjuvant chemotherapy completed May 2015 as summarized below. She is currently receiving radiation therapy.  The patient's subsequent history is detailed below.   PAST MEDICAL HISTORY: Past Medical History:  Diagnosis Date  . Allergy    latex  . Arthritis   . Bipolar 1 disorder ([place])   . Bipolar 1 disorder ([place]) 04/04/2013  . Blood transfusion without reported diagnosis    2014 during chemotherapy  . Breast cancer ([place]) 03/10/13   left breast invasive ca  . Cancer ([place])   . Coronary artery disease   . Diabetes mellitus without complication (HCC)    NIDDM  . GERD (gastroesophageal reflux disease)   . Heart murmur   . Hyperlipidemia   . Hyperlipidemia 04/04/2013  . Hypertension   . Hypertension 04/04/2013  . Obesity   . Personal history of chemotherapy   . Personal history of radiation therapy   . Radiation 01/30/14-03/19/14  . Sleep apnea    no cpap due to insurance    PAST SURGICAL HISTORY: Past Surgical History:  Procedure Laterality Date  . ABDOMINAL HYSTERECTOMY    . BREAST BIOPSY Left 05/08/09   fibroadenoma with microcalcifications,no malignancy id  . BREAST BIOPSY Left 03/07/13   invasive ca,1 lymph node metastatic  . 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 BREAST LUMPECTOMY Right 2014  . BREAST LUMPECTOMY WITH NEEDLE LOCALIZATION AND AXILLARY SENTINEL LYMPH NODE BX Right 04/22/2013   Procedure: BREAST LUMPECTOMY WITH NEEDLE LOCALIZATION AND AXILLARY SENTINEL LYMPH NODE BX;  Surgeon: Adin Hector, MD;  Location: [place];  Service: General;  Laterality: Right;  . CARDIAC CATHETERIZATION    . CHOLECYSTECTOMY    . CORONARY ANGIOPLASTY     LAD stent 2003  . CORONARY STENT PLACEMENT  2001  . ESOPHAGOGASTRODUODENOSCOPY N/A 08/07/2013   Procedure: ESOPHAGOGASTRODUODENOSCOPY (EGD);   Surgeon: Missy Sabins, MD;  Location: [facility];  Service: Endoscopy;  Laterality: N/A;  . ESOPHAGOGASTRODUODENOSCOPY N/A 11/13/2013   Procedure: ESOPHAGOGASTRODUODENOSCOPY (EGD);  Surgeon: Wonda Horner, MD;  Location: [facility] END[facility]COPY;  Service: Endoscopy;  Laterality: N/A;  . EYE SURGERY  6/13,9/14   laser,cataract lft  . MASTECTOMY Left 2014  . MASTECTOMY MODIFIED RADICAL Left 04/22/2013   Procedure: MASTECTOMY MODIFIED RADICAL;  Surgeon: Adin Hector, MD;  Location: [place];  Service: General;  Laterality: Left;  . PORTACATH PLACEMENT Right 04/22/2013   Procedure: INSERTION PORT-A-CATH;  Surgeon: Adin Hector, MD;  Location: [place];  Service: General;  Laterality: Right;    FAMILY HISTORY Family History  Problem Relation Age of Onset  . Heart disease Mother   . Breast cancer Maternal Aunt        dx in her 9s  . Heart disease Cousin 8       maternal cousin  . Heart attack Sister 40  . Cancer Paternal Aunt        NOS  . Cancer Maternal Aunt        NOS  . Lung cancer Cousin 90       maternal cousin - non smoker  . Brain cancer Cousin 13       maternal cousin's son  .  Lung cancer Cousin 19       maternal cousin's son  . Breast cancer Cousin        paternal cousin  . Colon cancer Neg Hx    The patient's father died from alcoholic cirrhosis at the age of 54. The patient's mother died from congestive heart failure the age of 7. The patient had no brothers, 4 sisters. One sister died from a myocardial infarction at the age of 25. The only other breast cancers in the family are a maternal aunt(Otto 70) with breast cancer diagnosed after the age of 48 and a second cousin on the mother's side, diagnosed with breast cancer approximately age 88. There is no history of ovarian cancer in the family. The patient has undergone genetic testing and his BRCA negative   GYNECOLOGIC HISTORY:  No LMP recorded. Patient is postmenopausal. Menarche age 76. The patient is GX P0. She underwent  hysterectomy with bilateral salpingo-oophorectomy in 1997. She took hormone replacement briefly.   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 She is single. Currently she lives mostly in [place] at the home of her  nephew, who is disabled on hemodialysis; with the patient's goddaughter who is a Restaurant manager, fast food to be a Marine scientist, and with the patient Linus Salmons.  The patient also lives part of the time in [place] with a good friend, Maylene Roes, who is however in very poor health The patient attends a local [employer].   ADVANCED DIRECTIVES: Not in place. At her 02/12/2014 visit the patient was given the appropriate documents to complete and notarize at her discretion. She tells me she intends to name her goddaughter, Gerlene Burdock, as her healthcare power of attorney. Ms. Ky Barban can be reached at [phone].  The patient was given the information and documents again to complete and notarized at her discretion at the 08/30/2019 visit.   HEALTH MAINTENANCE: Social History   Tobacco Use  . Smoking status: Former Smoker    Packs/day: 1.00    Years: 31.00    Pack years: 31.00    Types: Cigarettes    Quit date: 07/19/1987    Years since quitting: 32.1  . Smokeless tobacco: Never Used  Substance Use Topics  . Alcohol use: No    Alcohol/week: 0.0 standard drinks  . Drug use: No    Colonoscopy: Never  PAP:  Bone density: Never  Lipid panel:  Allergies  Allergen Reactions  . Cephalexin Other (See Comments)    Burned throat.  . Adhesive [Tape] Rash    Clear tape gloves ,elastic no problem  . Latex Hives and Rash    Current Outpatient Medications  Medication Sig Dispense Refill  . amLODipine (NORVASC) 10 MG tablet TAKE 1 TABLET BY MOUTH EVERY DAY 90 tablet 1  . buPROPion (WELLBUTRIN SR) 150 MG 12 hr tablet TAKE 1 TABLET BY MOUTH EVERY DAY 90 tablet 2  . cholecalciferol (VITAMIN D3) 25 MCG (1000 UT) tablet Take 1 tablet (1,000  Units total) by mouth daily. 100 tablet 4  . gabapentin (NEURONTIN) 100 MG capsule TAKE 1 CAPSULE BY MOUTH 3 TIMES A DAY 90 capsule 20  . hydrALAZINE (APRESOLINE) 25 MG tablet TAKE 1 TABLET BY MOUTH THREE TIMES A DAY 90 tablet 1  . lidocaine-prilocaine (EMLA) cream Apply topically as needed. Apply to port-a-cath one - two hours before accessing. 30 g 1  . losartan (COZAAR) 100 MG tablet Take 1 tablet (100 mg total)  by mouth daily. 90 tablet 1  . metoprolol succinate (TOPROL-XL) 25 MG 24 hr tablet TAKE 1 TABLET BY MOUTH EVERY DAY 90 tablet 1  . naproxen (NAPROSYN) 500 MG tablet TAKE 1 TABLET BY MOUTH TWICE A DAY AS NEEDED 60 tablet 0  . ondansetron (ZOFRAN) 8 MG tablet Take 1 tablet (8 mg total) by mouth every 8 (eight) hours as needed for nausea or vomiting. 20 tablet 0  . ONETOUCH VERIO test strip CHECK BLOOD SUGAR TWICE A DAY BEFORE BREAKFAST AND DINNER 100 each 7  . palbociclib (IBRANCE) 125 MG capsule Take 1 capsule (125 mg total) by mouth daily with breakfast. Take whole with food. Take for 21 days on, 7 days off, repeat every 28 days. 21 capsule 6  . pantoprazole (PROTONIX) 40 MG tablet Take 1 tablet (40 mg total) by mouth daily. 30 tablet 3  . rosuvastatin (CRESTOR) 20 MG tablet TAKE 1 TABLET BY MOUTH EVERY DAY 90 tablet 0  . Semaglutide,0.25 or 0.5MG/DOS, (OZEMPIC, 0.25 OR 0.5 MG/DOSE,) 2 MG/1.5ML SOPN Inject 0.5 mg into the skin once a week. 9 mL 1   No current facility-administered medications for this visit.   Facility-Administered Medications Ordered in Other Visits  Medication Dose Route Frequency Provider Last Rate Last Admin  . sodium chloride 0.9 % injection 10 mL  10 mL Intravenous PRN Marcy Panning, MD   10 mL at 05/24/13 1522    OBJECTIVE: Middle-aged African-American woman using a cane  Vitals:   08/30/19 0915  BP: (!) 172/79  Pulse: 97  Resp: 18  Temp: 97.8 F (36.6 C)  SpO2: 100%     Body mass index is 36.42 kg/m.    ECOG FS:1 - Symptomatic but completely  ambulatory Filed Weights   08/30/19 0915  Weight: 200 lb 6.4 oz (90.9 kg)    Sclerae mildly icteric, EOMs intact Wearing a mask No cervical or supraclavicular adenopathy Lungs no rales or rhonchi Heart regular rate and rhythm Abd soft, nontender, positive bowel sounds MSK no focal spinal tenderness, port in place Neuro: nonfocal, well oriented, appropriate affect Breasts: The right breast is status post remote lumpectomy, with no evidence of local disease.  The left breast is status post mastectomy with no evidence of chest wall recurrence.  Both axillae are benign.   LAB RESULTS:  CMP     Component Value Date/Time   NA 139 08/30/2019 0848   NA 140 06/12/2019 1145   NA 136 09/06/2016 1114   K 3.3 (L) 08/30/2019 0848   K 4.1 09/06/2016 1114   CL 99 08/30/2019 0848   CO2 28 08/30/2019 0848   CO2 24 09/06/2016 1114   GLUCOSE 202 (H) 08/30/2019 0848   GLUCOSE 286 (H) 09/06/2016 1114   BUN 12 08/30/2019 0848   BUN 17 06/12/2019 1145   BUN 15.7 09/06/2016 1114   CREATININE 1.43 (H) 08/30/2019 0848   CREATININE 0.9 09/06/2016 1114   CALCIUM 9.0 08/30/2019 0848   CALCIUM 9.7 09/06/2016 1114   PROT 7.2 08/30/2019 0848   PROT 7.6 06/12/2019 1145   PROT 7.1 09/06/2016 1114   ALBUMIN 3.0 (L) 08/30/2019 0848   ALBUMIN 4.7 06/12/2019 1145   ALBUMIN 3.5 09/06/2016 1114   AST 491 (HH) 08/30/2019 0848   AST 12 09/06/2016 1114   ALT 139 (H) 08/30/2019 0848   ALT 16 09/06/2016 1114   ALKPHOS 591 (H) 08/30/2019 0848   ALKPHOS 146 09/06/2016 1114   BILITOT 4.8 (HH) 08/30/2019 0848   BILITOT 0.58 09/06/2016  [place] (L) 08/30/2019 0848   GFRAA 44 (L) 08/30/2019 0848   No results found for: SPEP  Lab Results  Component Value Date   WBC 14.0 (H) 08/30/2019   NEUTROABS 11.5 (H) 08/30/2019   HGB 12.1 08/30/2019   HCT 37.5 08/30/2019   MCV 94.9 08/30/2019   PLT 286 08/30/2019      Chemistry      Component Value Date/Time   NA 139 08/30/2019 0848   NA 140  06/12/2019 1145   NA 136 09/06/2016 1114   K 3.3 (L) 08/30/2019 0848   K 4.1 09/06/2016 1114   CL 99 08/30/2019 0848   CO2 28 08/30/2019 0848   CO2 24 09/06/2016 1114   BUN 12 08/30/2019 0848   BUN 17 06/12/2019 1145   BUN 15.7 09/06/2016 1114   CREATININE 1.43 (H) 08/30/2019 0848   CREATININE 0.9 09/06/2016 1114      Component Value Date/Time   CALCIUM 9.0 08/30/2019 0848   CALCIUM 9.7 09/06/2016 1114   ALKPHOS 591 (H) 08/30/2019 0848   ALKPHOS 146 09/06/2016 1114   AST 491 (HH) 08/30/2019 0848   AST 12 09/06/2016 1114   ALT 139 (H) 08/30/2019 0848   ALT 16 09/06/2016 1114   BILITOT 4.8 (HH) 08/30/2019 0848   BILITOT 0.58 09/06/2016 1114       No results found for: LABCA2  No components found for: OINOM767  No results for input(s): INR in the last 168 hours.  Urinalysis    Component Value Date/Time   COLORURINE YELLOW 10/08/2015 2032   APPEARANCEUR CLEAR 10/08/2015 2032   LABSPEC 1.017 10/08/2015 2032   PHURINE 6.5 10/08/2015 2032   GLUCOSEU NEGATIVE 10/08/2015 2032   HGBUR TRACE (A) 10/08/2015 2032   BILIRUBINUR small 06/12/2019 1124   KETONESUR NEGATIVE 10/08/2015 2032   PROTEINUR Positive (A) 06/12/2019 1124   PROTEINUR 100 (A) 10/08/2015 2032   UROBILINOGEN 1.0 06/12/2019 1124   UROBILINOGEN 1.0 11/11/2013 1054   NITRITE positive 06/12/2019 1124   NITRITE POSITIVE (A) 10/08/2015 2032   LEUKOCYTESUR Small (1+) (A) 06/12/2019 1124    STUDIES:   1. Large right hilar lymph node. The setting of suspected hepatic metastases and prior breast cancer, the appearances concerning for lymphatic spread  Signed (Electronic Signature): 08/29/2019 9:21 AM  Signed By: Elliot Dally Powers  Result Narrative  PROCEDURE: CT CHEST W CONTRAST  INDICATION: Abnormal noncontrasted scan, cancer patient, Epigastric pain  COMPARISON: CT abdomen pelvis 08/29/2019 at 5:26 AM  TECHNIQUE: Axial CT images were obtained from the lung apices through the diaphragmatic hiatus.  Intravenous contrast was administered for the purposes of this exam.  FINDINGS: Neck/Thyroid: The thyroid gland is normal. The visible cervical soft tissues are normal.  Chest wall/Axilla: No chest wall masses or axillary lymphadenopathy. Surgical clips in the left axilla suggest prior lymph node dissection. Patient is status post left mastectomy. Implanted right chest port catheter. Surrounding soft tissue gas suggests recent catheter implantation.  Mediastinum/Hila: Right hilar lymphadenopathy with a prominent node measuring 14 mm in short axis (series 2:26). The pulmonary trunk and the thoracic aorta appear normal in caliber.  Cardiac: The heart is large. Small pericardial effusion. There is atherosclerotic calcification of the coronary arteries.  Large airways: The large airways are normal in caliber.  Lungs/Pleura: The lungs are adequately expanded. No dense consolidation or pulmonary nodule. No pneumothorax or pleural effusion is present.  Upper Abdomen: See dedicated CT abdomen pelvis for discussion of findings below the diaphragm.  Musculoskeletal: No lytic or sclerotic lesions suspicious for osseous metastases are identified.  Other Result Information  Interface, Rad Results In - 08/29/2019  9:23 AM EST PROCEDURE: CT CHEST W CONTRAST  INDICATION: Abnormal noncontrasted scan, cancer patient, Epigastric pain   Result Impression  1. Hepatomegaly with extensive hepatic metastatic disease and trace perihepatic ascites. 2. Partially and incidentally imaged lung base findings warranting clinical correlation for ongoing CHF, fluid overload, etc. 3. Multiple additional more nonacute findings as detailed above.   NOTE: Preliminary report of results relayed on 08/29/2019 at 0608.   Signed (Electronic Signature): 08/29/2019 9:15 AM  Signed By: Percell Boston  Result Narrative   PROCEDURE: CT ABDOMEN PELVIS W WO CONTRAST  HISTORY: epigastric pain - R10.13 - Epigastric  pain. History of breast cancer.  COMPARISON: None.  TECHNIQUE: Axial CT scanning performed through abdomen and pelvis both before and after IV contrast administration, including 15 minute delayed postcontrast scanning. Multiplanar reconstruction images generated also.  IV CONTRAST: 100 cc Omnipaque 350.  ORAL CONTRAST: None. Exam sensitivity decreased in some respects by absence of GI tract contrast.  FINDINGS:  Lung bases: No visible pleural effusion or substantial pulmonary consolidation. Bibasilar subsegmental atelectatic changes and/or more chronic pulmonary scarring. Bibasilar pulmonary groundglass opacities and questioned septal thickening, possibly reflecting pulmonary edema. Cardiomegaly. Mitral annular and aortic valvular calcifications. At least small pericardial effusion.  Abdominal aorta: Normal in diameter with extensive aortoiliac atherosclerotic calcifications.  Liver: Enlarged. Heterogeneous attenuation with numerous extensive hypoattenuating masses involving each hepatic lobe segment. Largest mass estimated 5 cm in size at left hepatic lobe adjacent to falciform ligament. Trace perihepatic ascites.  Biliary system: Status post cholecystectomy. No significant intrahepatic biliary ductal dilatation seen. Relative common duct prominence anticipated more likely related to patient's age and postcholecystectomy state.  Pancreas: No abnormal mass or fluid collection seen. No pathologic calcifications or significant peripancreatic inflammatory changes.  Spleen: Not abnormally enlarged. No focal intrasplenic mass.  Adrenal glands: No nodule or mass seen on either side.  Kidneys and ureters: Symmetric bilateral renal contrast enhancement and excretion. No renal mass or significant perinephric stranding on either side. No hydroureteronephrosis on either side.  Urinary bladder: No abnormal concentric wall thickening. No intraluminal calculus or obvious  mass.  Reproductive organs: Status post hysterectomy. No obvious bulky adnexal mass pathology.  GI tract/peritoneum/retroperitoneum: No significantly dilated bowel loops. Bilateral colonic stool noted, warranting clinical correlation for constipation etc. No frank pneumoperitoneum or other significant free fluid seen.   Lymph nodes: No obvious CT-significant adenopathy seen.   Osseous structures: Multilevel lower thoracic and lumbar spondylosis changes. Hip degenerative arthritic changes notably asymmetrically more severe and advanced on right than on left. Subchondral cystic and sclerotic changes at right femoral head possibly all degenerative arthropathic in nature but cannot exclude potential evolving osteonecrosis/AVN here. No lytic or sclerotic osseous lesions overtly suspicious for occult skeletal metastatic disease.  Other Result Information  Interface, Rad Results In - 08/29/2019  9:17 AM EST     ASSESSMENT: 64 y.o. BRCA negative [place] woman  LEFT BREAST (1) status post left breast upper outer quadrant and left axillary lymph node biopsy 03/07/2013, both positive for an invasive ductal carcinoma, grade 1 or 2, estrogen receptor and progesterone receptor both strongly positive, HER-2 negative, with an MIB-1 between 13% and 15%  (2) biopsy of a more anterior mass in the left breast 04/03/2013 showed invasive ductal carcinoma, grade 2, estrogen and progesterone receptor positive, HER-2 negative, with an MIB-1 of 33%  (3) status post  left modified radical mastectomy 04/22/2013 for an mpT3 pN2, stage IIIA invasive ductal carcinoma, grade 1, repeat HER-2 again negative; 4 of 22 lymph nodes positive; ample margins   RIGHT BREAST (4) status post right central breast biopsy 04/03/2013 for ductal carcinoma in situ, 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  ADJUVANT CHEMOTHERAPY (6) cyclophosphamide, epirubicin, and fluorouracil given every 14 days x6 cycles completed 08/02/2013  (7) weekly paclitaxel x2, discontinued 08/23/2013, with poor tolerance   (8) Abraxane given day 1 day 8 of each 21 day cycle, with some delays, for a total 8 doses, completed 11/23/2023  ADJUVANT RADIATION Pablo Ledger) (9) adjuvant radiation 01/30/2014-03/19/2014  Site/dose:     Right breast / 45 Gray @ 1.8 [place] Cables per fraction x 25 fractions Right breast boost / 16 Gray at [employer] per fraction x 8 fractions  Left chest wall / 50.4 Gray @ 1.8 [place] Cables per fraction x 28 fractions Left Supraclavicular fossa / 45 Gray _0 .8 Gray per fraction x 25 fractions Left scar / 10 Gray at [employer] per fraction x 5 fractions  Beams/energy:  Opposed Tangents with electronic compensation and every other day bolus / 6 and 10MV photons En face / 15 MeV electrons  Opposed Tangents / 6 MV photons Right anterior oblique / 6 MV photons En face with 0.5 cm bolus daily to the 95% IDL/ 9 MeV electrons  ANTI-ESTROGEN THERAPY (10) started anastrozole 04/16/2014; discontinued February 2021 with progression  (a) dexa scan 04/03/2014 showed mild osteopenia (T - 1.4)  GENETIC TESTING (11) a broad genetic panel sent December 2014 showed a variant of uncertain significance, 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  There were no deleterious mutations noted in the other genes tested, which included ATM, BARD1, BRCA1, BRCA2, BRIP1, CDH1, CHEK2, EPCAM, FANCC, MLH1, MSH2, MSH6, NBN, PALB2, PMS2, PTEN, RAD51C, RAD51D, STK11, TP53, and XRCC2.   METASTATIC DISEASE: FEB 2021 (12) CT scans of the chest abdomen and pelvis 08/29/2019 showed multiple liver masses, right hilar adenopathy, no definitive lung or bone involvement  (a) liver biopsy pending   PLAN: Elvenia is now a little over 6 years out from definitive surgery  for her breast cancer.  She understands that at this point it does look like she has metastatic disease chiefly involving the liver.  If this is her breast cancer then that cancer was already present in the liver more than 6 years ago, but only in a microscopic way so it could not be found.  That would suggest that the disease is slow-growing although breast cancer certainly can transform into a more aggressive form.  Also she has been on antiestrogens specifically anastrozole for the past 5 years and if this is breast cancer the disease has managed to grow through that.  Again if this is breast cancer then it is stage IV. She understands that stage IV breast cancer is not curable with our current knowledge base. The goal of treatment is control. The strategy of treatment is to do only the minimum necessary to control the growth of the tumor so that the patient can have as normal a life as possible. There is no survival advantage in treating aggressively if treating less aggressively results in tumor control. With this strategy stage IV breast cancer in many cases can function as a "chronic  illness": something that cannot be quite gotten rid of but can be controlled for an indefinite period of time  She had never completed healthcare power of attorney.  I gave her the papers again at this visit and urged her to get that done and notarized.  The first thing we need is a biopsy, which should be obtained from the liver.  This may not be breast cancer but a different cancer.  If so it would be treated completely different.  If it is breast cancer we would want to know if it is still estrogen dependent and if so we would likely treat her with fulvestrant and palbociclib or a similar combination.  I have gone ahead and placed the order for a liver biopsy sometime next week.  However Ellianah tells me she really lives primarily in [place].  It is a 2-hour drive for her to get here.  Her sister says she really  cannot drive her here.  The patient would prefer to be treated locally.  We will see if we can find a local oncologist that would be willing to follow her closer to home.  Until that is established the plan is for biopsy and then return to see me in about 10 to 12 days to initiate therapy as discussed.  Total encounter time 40 minutes.*     Lashandra Arauz, Virgie Dad, MD  08/30/19 1:29 PM Medical Oncology and Hematology [facility] [place], [place] [address] Tel. [phone]    Fax. [phone]   I, Wilburn Mylar, am acting as scribe for Dr. Virgie Dad. Sury Wentworth.  I, Lurline Del MD, have reviewed the above documentation for accuracy and completeness, and I agree with the above.    *Total Encounter Time as defined by the Centers for Medicare and Medicaid Services includes, in addition to the face-to-face time of a patient visit (documented in the note above) non-face-to-face time: obtaining and reviewing outside history, ordering and reviewing medications, tests or procedures, care coordination (communications with other health care professionals or caregivers) and documentation in the medical record.

## 2019-08-30 ENCOUNTER — Other Ambulatory Visit: Payer: Self-pay | Admitting: *Deleted

## 2019-08-30 ENCOUNTER — Other Ambulatory Visit: Payer: Self-pay

## 2019-08-30 ENCOUNTER — Telehealth: Payer: Self-pay | Admitting: Oncology

## 2019-08-30 ENCOUNTER — Telehealth: Payer: Self-pay

## 2019-08-30 ENCOUNTER — Telehealth: Payer: Self-pay | Admitting: *Deleted

## 2019-08-30 ENCOUNTER — Ambulatory Visit: Payer: Self-pay

## 2019-08-30 ENCOUNTER — Inpatient Hospital Stay: Payer: Medicare Other

## 2019-08-30 ENCOUNTER — Inpatient Hospital Stay (HOSPITAL_BASED_OUTPATIENT_CLINIC_OR_DEPARTMENT_OTHER): Payer: Medicare Other | Admitting: Oncology

## 2019-08-30 VITALS — BP 172/79 | HR 97 | Temp 97.8°F | Resp 18 | Ht 62.2 in | Wt 200.4 lb

## 2019-08-30 DIAGNOSIS — C787 Secondary malignant neoplasm of liver and intrahepatic bile duct: Secondary | ICD-10-CM | POA: Diagnosis not present

## 2019-08-30 DIAGNOSIS — C50412 Malignant neoplasm of upper-outer quadrant of left female breast: Secondary | ICD-10-CM | POA: Diagnosis not present

## 2019-08-30 DIAGNOSIS — C778 Secondary and unspecified malignant neoplasm of lymph nodes of multiple regions: Secondary | ICD-10-CM

## 2019-08-30 DIAGNOSIS — D0511 Intraductal carcinoma in situ of right breast: Secondary | ICD-10-CM | POA: Diagnosis not present

## 2019-08-30 DIAGNOSIS — Z452 Encounter for adjustment and management of vascular access device: Secondary | ICD-10-CM | POA: Diagnosis not present

## 2019-08-30 DIAGNOSIS — C773 Secondary and unspecified malignant neoplasm of axilla and upper limb lymph nodes: Secondary | ICD-10-CM | POA: Diagnosis not present

## 2019-08-30 DIAGNOSIS — R160 Hepatomegaly, not elsewhere classified: Secondary | ICD-10-CM | POA: Diagnosis not present

## 2019-08-30 DIAGNOSIS — M25559 Pain in unspecified hip: Secondary | ICD-10-CM | POA: Diagnosis not present

## 2019-08-30 DIAGNOSIS — Z170 Estrogen receptor positive status [ER+]: Secondary | ICD-10-CM

## 2019-08-30 DIAGNOSIS — R1013 Epigastric pain: Secondary | ICD-10-CM | POA: Diagnosis not present

## 2019-08-30 DIAGNOSIS — K5900 Constipation, unspecified: Secondary | ICD-10-CM | POA: Diagnosis not present

## 2019-08-30 DIAGNOSIS — R599 Enlarged lymph nodes, unspecified: Secondary | ICD-10-CM | POA: Diagnosis not present

## 2019-08-30 LAB — CMP (CANCER CENTER ONLY)
ALT: 139 U/L — ABNORMAL HIGH (ref 0–44)
AST: 491 U/L (ref 15–41)
Albumin: 3 g/dL — ABNORMAL LOW (ref 3.5–5.0)
Alkaline Phosphatase: 591 U/L — ABNORMAL HIGH (ref 38–126)
Anion gap: 12 (ref 5–15)
BUN: 12 mg/dL (ref 8–23)
CO2: 28 mmol/L (ref 22–32)
Calcium: 9 mg/dL (ref 8.9–10.3)
Chloride: 99 mmol/L (ref 98–111)
Creatinine: 1.43 mg/dL — ABNORMAL HIGH (ref 0.44–1.00)
GFR, Est AFR Am: 44 mL/min — ABNORMAL LOW (ref >60)
GFR, Estimated: 38 mL/min — ABNORMAL LOW (ref >60)
Glucose, Bld: 202 mg/dL — ABNORMAL HIGH (ref 70–99)
Potassium: 3.3 mmol/L — ABNORMAL LOW (ref 3.5–5.1)
Sodium: 139 mmol/L (ref 135–145)
Total Bilirubin: 4.8 mg/dL (ref 0.3–1.2)
Total Protein: 7.2 g/dL (ref 6.5–8.1)

## 2019-08-30 LAB — CBC WITH DIFFERENTIAL (CANCER CENTER ONLY)
Abs Immature Granulocytes: 0.1 10*3/uL — ABNORMAL HIGH (ref 0.00–0.07)
Basophils Absolute: 0.1 10*3/uL (ref 0.0–0.1)
Basophils Relative: 0 %
Eosinophils Absolute: 0.1 10*3/uL (ref 0.0–0.5)
Eosinophils Relative: 0 %
HCT: 37.5 % (ref 36.0–46.0)
Hemoglobin: 12.1 g/dL (ref 12.0–15.0)
Immature Granulocytes: 1 %
Lymphocytes Relative: 6 %
Lymphs Abs: 0.8 10*3/uL (ref 0.7–4.0)
MCH: 30.6 pg (ref 26.0–34.0)
MCHC: 32.3 g/dL (ref 30.0–36.0)
MCV: 94.9 fL (ref 80.0–100.0)
Monocytes Absolute: 1.5 10*3/uL — ABNORMAL HIGH (ref 0.1–1.0)
Monocytes Relative: 10 %
Neutro Abs: 11.5 10*3/uL — ABNORMAL HIGH (ref 1.7–7.7)
Neutrophils Relative %: 83 %
Platelet Count: 286 10*3/uL (ref 150–400)
RBC: 3.95 MIL/uL (ref 3.87–5.11)
RDW: 17.7 % — ABNORMAL HIGH (ref 11.5–15.5)
WBC Count: 14 10*3/uL — ABNORMAL HIGH (ref 4.0–10.5)
nRBC: 0 % (ref 0.0–0.2)

## 2019-08-30 MED ORDER — PALBOCICLIB 125 MG PO CAPS: 125.0000 mg | ORAL_CAPSULE | Freq: Every day | ORAL | 6 refills | Status: DC

## 2019-08-30 NOTE — Telephone Encounter (Signed)
Oral Oncology Patient Advocate Encounter  After completing a benefits investigation, prior authorization for Leslee Home is not required at this time through [place].  Patient's copay is [phone].    Kathy Maxwell Phone [phone] Fax [phone] 08/30/2019 3:08 PM

## 2019-08-30 NOTE — Telephone Encounter (Signed)
This RN faxed records and to [facility] in [facility] for establishing care for pt who lives in area.  Records faxed to [phone] attention [place]

## 2019-08-30 NOTE — Chronic Care Management (AMB) (Addendum)
  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   SW noted patient appointment to see oncologist this morning to review CT scans performed during ED visit on 08/29/19. SW collaborated with embedded care management team to communicate patients recent ED visit and plan for SW to contact the patient in the next 3 days to follow up on outcome of oncology appointment and plan.   Plan: Appointment scheduled for SW to contact the patient over the next three days.  Daneen Schick, BSW, CDP Social Worker, Certified Dementia Practitioner [place] [facility] [place] [facility] [phone]

## 2019-08-30 NOTE — Telephone Encounter (Signed)
I left a message regarding schedule  

## 2019-08-31 LAB — CANCER ANTIGEN 27.29: CA 27.29: 143.9 U/mL — ABNORMAL HIGH (ref 0.0–38.6)

## 2019-09-02 ENCOUNTER — Ambulatory Visit: Payer: Self-pay

## 2019-09-02 ENCOUNTER — Telehealth: Payer: Self-pay | Admitting: Pharmacist

## 2019-09-02 ENCOUNTER — Telehealth: Payer: Self-pay

## 2019-09-02 DIAGNOSIS — Z170 Estrogen receptor positive status [ER+]: Secondary | ICD-10-CM

## 2019-09-02 DIAGNOSIS — C778 Secondary and unspecified malignant neoplasm of lymph nodes of multiple regions: Secondary | ICD-10-CM

## 2019-09-02 DIAGNOSIS — C50412 Malignant neoplasm of upper-outer quadrant of left female breast: Secondary | ICD-10-CM

## 2019-09-02 MED ORDER — PALBOCICLIB 125 MG PO TABS: 125.0000 mg | ORAL_TABLET | Freq: Every day | ORAL | 1 refills | Status: AC

## 2019-09-02 NOTE — Telephone Encounter (Signed)
Oral Oncology Pharmacist Encounter  Received new prescription for Ibrance (palbociclib) for the treatment of metastatic breast cancer in conjunction with fulvestrant, planned duration until disease progression or unacceptable drug toxicity. Plan to start at next office visit 09/11/19.  CMP from 09/02/19 assessed, AST/ALT/Tbili elevated. Significant increase from the previous lab work obtained on 06/12/19. This may be from metastatic diease in the liver. Liver biopsy planned. If this is hepatic impairment and not metastatic disease, patient will need to have her palbociclib dose reduced.   Current medication list in Epic reviewed, no relevant DDIs with palbociclib identified.  Prescription has been e-scribed to the [facility] for benefits analysis and approval.  Oral Oncology Clinic will continue to follow for insurance authorization, copayment issues, initial counseling and start date.  Darl Pikes, PharmD, BCPS, [facility] Hematology/Oncology Clinical Pharmacist [facility] Oral [place] [facility] [phone]  09/02/2019 9:57 AM

## 2019-09-02 NOTE — Telephone Encounter (Signed)
Oral Oncology Patient Advocate Encounter   Was successful in securing patient an $26 grant from Patient [facility]) to provide copayment coverage for Ibrance.  This will keep the out of pocket expense at $0.     I have spoken with the patient.    The billing information is as follows and has been shared with [place].   Member ID: [id number] Group ID: [id number] RxBin: [id number] Dates of Eligibility: 06/04/19 through 06/30/21  Fund:  [place] Patient [place] Phone [phone] Fax [phone] 09/02/2019 11:52 AM

## 2019-09-03 NOTE — Chronic Care Management (AMB) (Signed)
  Care Management    Consult Note  09/02/2019 Name: Kathy Maxwell MRN: [id number] DOB: [birth year]  Care management team received notification of patient's recent emergency department visit related to malignant neoplasm .Based on review of health record, Kathy Maxwell is currently active in the embedded care coordination program.. Outbound call placed to the patient to assist with care coordination needs.   Review of patient status, including review of consultants reports, relevant laboratory and other test results, and collaboration with appropriate care team members and the patient's provider was performed as part of comprehensive patient evaluation and provision of chronic care management services.    The patient reports she has plans to undergo a liver biopsy to determine type of malignancy. She is awaiting approval for a grant to receive medications.   Plan: Scheduled follow up call over the next month. Encouraged the patient to contact SW directly if assistance is needed prior to next scheduled call.  Daneen Schick, BSW, CDP Social Worker, Certified Dementia Practitioner [place] [facility] [place] [facility] [phone]

## 2019-09-04 MED FILL — IBRANCE 125 MG TABS: 125 | 28 days supply | Qty: 21 | Fill #0

## 2019-09-04 NOTE — Telephone Encounter (Signed)
Oral Chemotherapy Pharmacist Encounter  Mellette will mail out Ibrance for delivery on 09/05/19. Instructed patient to hold on beginning her Leslee Home and bring it with her to her next office visit on 09/11/19.  Patient Education I spoke with patient for overview of new oral chemotherapy medication: Ibrance (palbociclib) for the treatment of metastatic breast cancer in conjunction with fulvestrant, planned duration until disease progression or unacceptable drug toxicity. Plan to start at next office visit 09/11/19. Pt is doing well. Counseled patient on administration, dosing, side effects, monitoring, drug-food interactions, safe handling, storage, and disposal. Patient will take 1 tablet (125 mg total) by mouth daily. Take for 21 days on, 7 days off, repeat every 28 days.  Side effects include but not limited to: decreased wbc, N/V, fatigue, hair loss.    Reviewed with patient importance of keeping a medication schedule and plan for any missed doses.  Kathy Maxwell voiced understanding and appreciation. All questions answered. Medication handout placed in the mail.  Provided patient with Oral [facility] phone number. Patient knows to call the office with questions or concerns. Oral Chemotherapy Navigation Clinic will continue to follow.  Darl Pikes, PharmD, BCPS, [facility] Hematology/Oncology Clinical Pharmacist [facility] Oral [place] [facility] [phone]  09/04/2019 10:17 AM

## 2019-09-06 ENCOUNTER — Ambulatory Visit
Admission: RE | Admit: 2019-09-06 | Discharge: 2019-09-06 | Disposition: A | Discharge status: Home / Self Care | Payer: Self-pay | Source: Ambulatory Visit | Attending: Oncology | Admitting: Oncology

## 2019-09-06 ENCOUNTER — Other Ambulatory Visit: Payer: Self-pay | Admitting: *Deleted

## 2019-09-06 DIAGNOSIS — Z170 Estrogen receptor positive status [ER+]: Secondary | ICD-10-CM

## 2019-09-06 DIAGNOSIS — C778 Secondary and unspecified malignant neoplasm of lymph nodes of multiple regions: Secondary | ICD-10-CM

## 2019-09-06 DIAGNOSIS — C787 Secondary malignant neoplasm of liver and intrahepatic bile duct: Secondary | ICD-10-CM

## 2019-09-06 DIAGNOSIS — C50412 Malignant neoplasm of upper-outer quadrant of left female breast: Secondary | ICD-10-CM

## 2019-09-09 ENCOUNTER — Encounter (HOSPITAL_COMMUNITY): Payer: Self-pay | Admitting: Radiology

## 2019-09-09 ENCOUNTER — Telehealth: Payer: Self-pay

## 2019-09-09 NOTE — Telephone Encounter (Signed)
We have attempted to call the patient two times to schedule sleep study.  Patient has been unavailable at the phone numbers we have on file and has not returned our calls. If patient calls back we will schedule them for their sleep study.  

## 2019-09-09 NOTE — Progress Notes (Signed)
Kathy Maxwell Female, 64 y.o., [birth year] MRN:  [id number] Phone:  [phone] [name]) PCP:  [name], FNP Coverage:  [place] Medicare Next Appt With Oncology 09/11/2019 at 10:30 AM  RE: CT Biopsy Received: 3 days ago Message Contents  Arne Cleveland, MD  [name]  Ok   [name] core liver met    DDH       Previous Messages   ----- Message -----  From: Garth Bigness D  Sent: 09/06/2019  1:02 PM EST  To: Ir Procedure Requests  Subject: CT Biopsy                     Procedure:  CT Biopsy   Reason: Malignant neoplasm of upper-outer quadrant of left breast in female, estrogen receptor positive, Ductal carcinoma in situ (DCIS) of right breast, 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

## 2019-09-10 ENCOUNTER — Telehealth: Payer: Self-pay | Admitting: *Deleted

## 2019-09-10 NOTE — Telephone Encounter (Signed)
This RN spoke with pt today per referral to [facility] with appointment for tomorrow as well as appointment with Dr Jana Hakim.  Pt's biopsy is scheduled for 09/16/2019 at [facility].  Per discussion pt will go to [facility] [place] [facility] to establish local care.   " it will be a lot easier on my family to take me there ".  Pt was reluctant last week to change providers but after thinking about it and discussing with family she will establish herself with local oncology care.  Of note pt has the Ibrance on hand - she has not started due to waiting on biopsy.  Plan at present- is Kathy Maxwell will proceed with appointment at [place] center tomorrow. She will let them know she has the [place] on hand. She is scheduled for biopsy 09/16/2019.  Appointment at this office for 2/24 will be canceled.  No further questions or needs at this time.

## 2019-09-11 ENCOUNTER — Encounter: Payer: Self-pay | Admitting: Oncology

## 2019-09-11 ENCOUNTER — Inpatient Hospital Stay: Payer: Medicare Other

## 2019-09-11 ENCOUNTER — Inpatient Hospital Stay (HOSPITAL_BASED_OUTPATIENT_CLINIC_OR_DEPARTMENT_OTHER): Payer: Medicare Other | Admitting: Oncology

## 2019-09-11 DIAGNOSIS — Z170 Estrogen receptor positive status [ER+]: Secondary | ICD-10-CM | POA: Diagnosis not present

## 2019-09-11 DIAGNOSIS — Z923 Personal history of irradiation: Secondary | ICD-10-CM | POA: Diagnosis not present

## 2019-09-11 DIAGNOSIS — C778 Secondary and unspecified malignant neoplasm of lymph nodes of multiple regions: Secondary | ICD-10-CM

## 2019-09-11 DIAGNOSIS — R590 Localized enlarged lymph nodes: Secondary | ICD-10-CM | POA: Diagnosis not present

## 2019-09-11 DIAGNOSIS — Z79899 Other long term (current) drug therapy: Secondary | ICD-10-CM | POA: Diagnosis not present

## 2019-09-11 DIAGNOSIS — R160 Hepatomegaly, not elsewhere classified: Secondary | ICD-10-CM | POA: Diagnosis not present

## 2019-09-11 DIAGNOSIS — I10 Essential (primary) hypertension: Secondary | ICD-10-CM | POA: Diagnosis not present

## 2019-09-11 DIAGNOSIS — C787 Secondary malignant neoplasm of liver and intrahepatic bile duct: Secondary | ICD-10-CM

## 2019-09-11 DIAGNOSIS — E119 Type 2 diabetes mellitus without complications: Secondary | ICD-10-CM | POA: Diagnosis not present

## 2019-09-11 DIAGNOSIS — Z7984 Long term (current) use of oral hypoglycemic drugs: Secondary | ICD-10-CM | POA: Diagnosis not present

## 2019-09-11 DIAGNOSIS — Z9049 Acquired absence of other specified parts of digestive tract: Secondary | ICD-10-CM | POA: Diagnosis not present

## 2019-09-11 DIAGNOSIS — C50412 Malignant neoplasm of upper-outer quadrant of left female breast: Secondary | ICD-10-CM

## 2019-09-11 DIAGNOSIS — E7800 Pure hypercholesterolemia, unspecified: Secondary | ICD-10-CM | POA: Diagnosis not present

## 2019-09-11 DIAGNOSIS — Z9012 Acquired absence of left breast and nipple: Secondary | ICD-10-CM | POA: Diagnosis not present

## 2019-09-11 DIAGNOSIS — C50912 Malignant neoplasm of unspecified site of left female breast: Secondary | ICD-10-CM | POA: Diagnosis not present

## 2019-09-11 NOTE — Progress Notes (Signed)
Kathy Maxwell called [name] yesterday to let us know that she will be establishing care at the [place] center today.  She canceled the appointment here today.  At this point we are canceling all scheduled appointments.  We will be glad to see her at any point in the future if we can be of help to her.

## 2019-09-12 ENCOUNTER — Other Ambulatory Visit: Payer: Self-pay | Admitting: Nurse Practitioner

## 2019-09-12 ENCOUNTER — Other Ambulatory Visit: Payer: Self-pay | Admitting: Radiology

## 2019-09-12 ENCOUNTER — Ambulatory Visit: Payer: Medicare Other | Admitting: Nurse Practitioner

## 2019-09-12 ENCOUNTER — Telehealth: Payer: Self-pay

## 2019-09-12 NOTE — Telephone Encounter (Signed)
Medication refill

## 2019-09-13 ENCOUNTER — Other Ambulatory Visit: Payer: Self-pay | Admitting: Radiology

## 2019-09-16 ENCOUNTER — Ambulatory Visit (HOSPITAL_COMMUNITY): Admission: RE | Admit: 2019-09-16 | Payer: Medicare Other | Source: Ambulatory Visit

## 2019-09-17 ENCOUNTER — Other Ambulatory Visit: Payer: Self-pay | Admitting: Cardiology

## 2019-09-17 ENCOUNTER — Other Ambulatory Visit: Payer: Self-pay

## 2019-09-17 ENCOUNTER — Encounter: Payer: Self-pay | Admitting: Nurse Practitioner

## 2019-09-17 ENCOUNTER — Ambulatory Visit (INDEPENDENT_AMBULATORY_CARE_PROVIDER_SITE_OTHER): Payer: Medicare Other | Admitting: Nurse Practitioner

## 2019-09-17 VITALS — BP 164/98 | HR 78 | Temp 97.7°F | Ht 62.2 in | Wt 203.0 lb

## 2019-09-17 DIAGNOSIS — K219 Gastro-esophageal reflux disease without esophagitis: Secondary | ICD-10-CM

## 2019-09-17 DIAGNOSIS — R609 Edema, unspecified: Secondary | ICD-10-CM

## 2019-09-17 DIAGNOSIS — E1169 Type 2 diabetes mellitus with other specified complication: Secondary | ICD-10-CM

## 2019-09-17 DIAGNOSIS — I10 Essential (primary) hypertension: Secondary | ICD-10-CM | POA: Diagnosis not present

## 2019-09-17 DIAGNOSIS — Z794 Long term (current) use of insulin: Secondary | ICD-10-CM

## 2019-09-17 DIAGNOSIS — F319 Bipolar disorder, unspecified: Secondary | ICD-10-CM

## 2019-09-17 MED ORDER — FUROSEMIDE 20 MG PO TABS: 20.0000 mg | ORAL_TABLET | Freq: Every day | ORAL | 1 refills | Status: AC | PRN

## 2019-09-17 MED ORDER — PANTOPRAZOLE SODIUM 40 MG PO TBEC: 40.0000 mg | DELAYED_RELEASE_TABLET | Freq: Every day | ORAL | 3 refills | Status: DC

## 2019-09-17 MED ORDER — METFORMIN HCL 850 MG PO TABS: 850.0000 mg | ORAL_TABLET | Freq: Every day | ORAL | 1 refills | Status: AC

## 2019-09-17 MED ORDER — FUROSEMIDE 20 MG PO TABS: 20.0000 mg | ORAL_TABLET | Freq: Every day | ORAL | 1 refills | Status: DC

## 2019-09-17 MED ORDER — PANTOPRAZOLE SODIUM 40 MG PO TBEC: 40.0000 mg | DELAYED_RELEASE_TABLET | Freq: Every day | ORAL | 1 refills | Status: AC

## 2019-09-17 NOTE — Progress Notes (Signed)
Subjective:     Patient ID: Kathy Maxwell , female    DOB: 1956/03/28 , 64 y.o.   MRN: [id number]   Chief Complaint  Patient presents with  . Diabetes    HPI  Her cancer has come back, planning to do biopsy and imaging checking to see same strain. She was having chest pain was having test, seen spots liver 2 weeks ago.  She will be back to do biopsy. She has been more tired. She has been in [facility] and having test done in both places.  She is seeing an oncologist at [facility] at [facility]. Her sister Kathy Maxwell is here with her today.  She is not driving now.  She has a decreased appetite.  Wt Readings from Last 3 Encounters: 09/17/19 : 203 lb (92.1 kg) 08/30/19 : 200 lb 6.4 oz (90.9 kg) 06/12/19 : 204 lb (92.5 kg)   Diabetes She presents for her follow-up diabetic visit. She has type 2 diabetes mellitus. Her disease course has been stable. There are no hypoglycemic associated symptoms. Pertinent negatives for hypoglycemia include no dizziness or headaches. There are no diabetic associated symptoms. Pertinent negatives for diabetes include no blurred vision, no chest pain and no fatigue. There are no hypoglycemic complications. Symptoms are stable. There are no diabetic complications. Risk factors for coronary artery disease include obesity, sedentary lifestyle, dyslipidemia, diabetes mellitus and hypertension. Current diabetic treatment includes oral agent (dual therapy) and oral agent (monotherapy). She is compliant with treatment all of the time. She is following a generally unhealthy diet. She rarely participates in exercise. Her overall blood glucose range is >200 mg/dl. (Blood sugar 130-140.  ) An ACE inhibitor/angiotensin II receptor blocker is being taken. Eye exam is not current.  Hypertension This is a chronic problem. The current episode started more than 1 year ago. The problem has been gradually worsening since onset. The problem is uncontrolled. Pertinent negatives include no anxiety,  blurred vision, chest pain, headaches or palpitations. There are no associated agents to hypertension. Risk factors for coronary artery disease include sedentary lifestyle and obesity. There are no compliance problems.  There is no history of angina or kidney disease. There is no history of chronic renal disease.  Hip Pain  The incident occurred more than 1 week ago. Injury mechanism: sitting on bench and it collapsed 2 months ago. Pain location: right hip and thigh. The pain is at a severity of 5/10. The pain is moderate. The pain has been constant since onset. Pertinent negatives include no inability to bear weight. She reports no foreign bodies present. Nothing aggravates the symptoms. She has tried acetaminophen and heat for the symptoms. The treatment provided mild relief.     Past Medical History:  Diagnosis Date  . Allergy    latex  . Arthritis   . Bipolar 1 disorder ([place])   . Bipolar 1 disorder ([place]) 04/04/2013  . Blood transfusion without reported diagnosis    2014 during chemotherapy  . Breast cancer ([place]) 03/10/13   left breast invasive ca  . Cancer ([place])   . Coronary artery disease   . Diabetes mellitus without complication (HCC)    NIDDM  . GERD (gastroesophageal reflux disease)   . Heart murmur   . Hyperlipidemia   . Hyperlipidemia 04/04/2013  . Hypertension   . Hypertension 04/04/2013  . Obesity   . Personal history of chemotherapy   . Personal history of radiation therapy   . Radiation 01/30/14-03/19/14  . Sleep apnea  no cpap due to insurance     Family History  Problem Relation Age of Onset  . Heart disease Mother   . Breast cancer Maternal Aunt        dx in her 66s  . Heart disease Cousin 42       maternal cousin  . Heart attack Sister 11  . Cancer Paternal Aunt        NOS  . Cancer Maternal Aunt        NOS  . Lung cancer Cousin 43       maternal cousin - non smoker  . Brain cancer Cousin 13       maternal cousin's son  . Lung cancer Cousin 38        maternal cousin's son  . Breast cancer Cousin        paternal cousin  . Colon cancer Neg Hx      Current Outpatient Medications:  .  acetaminophen (TYLENOL) 500 MG tablet, Take 1,000 mg by mouth every 6 (six) hours as needed for moderate pain., Disp: , Rfl:  .  amLODipine (NORVASC) 10 MG tablet, TAKE 1 TABLET BY MOUTH EVERY DAY (Patient taking differently: Take 10 mg by mouth daily. ), Disp: 90 tablet, Rfl: 1 .  aspirin EC 81 MG tablet, Take 81 mg by mouth daily., Disp: , Rfl:  .  buPROPion (WELLBUTRIN SR) 150 MG 12 hr tablet, TAKE 1 TABLET BY MOUTH EVERY DAY (Patient taking differently: Take 150 mg by mouth daily. ), Disp: 90 tablet, Rfl: 2 .  cholecalciferol (VITAMIN D3) 25 MCG (1000 UT) tablet, Take 1 tablet (1,000 Units total) by mouth daily., Disp: 100 tablet, Rfl: 4 .  docusate sodium (COLACE) 100 MG capsule, Take 100 mg by mouth daily as needed for mild constipation., Disp: , Rfl:  .  fluticasone (FLONASE) 50 MCG/ACT nasal spray, Place 1 spray into both nostrils daily as needed for allergies or rhinitis., Disp: , Rfl:  .  furosemide (LASIX) 20 MG tablet, Take 20 mg by mouth daily., Disp: , Rfl:  .  gabapentin (NEURONTIN) 100 MG capsule, TAKE 1 CAPSULE BY MOUTH 3 TIMES A DAY (Patient taking differently: Take 100 mg by mouth 3 (three) times daily. ), Disp: 90 capsule, Rfl: 20 .  hydrALAZINE (APRESOLINE) 50 MG tablet, Take 50 mg by mouth in the morning and at bedtime., Disp: , Rfl:  .  HYDROcodone-acetaminophen (NORCO/VICODIN) 5-325 MG tablet, Take 1 tablet by mouth every 4 (four) hours as needed for moderate pain., Disp: , Rfl:  .  lidocaine-prilocaine (EMLA) cream, Apply topically as needed. Apply to port-a-cath one - two hours before accessing. (Patient taking differently: Apply 1 application topically as needed (Apply to port-a-cath one - two hours before accessing.). ), Disp: 30 g, Rfl: 1 .  losartan (COZAAR) 100 MG tablet, Take 1 tablet (100 mg total) by mouth daily., Disp: 90 tablet,  Rfl: 1 .  metoprolol succinate (TOPROL-XL) 25 MG 24 hr tablet, TAKE 1 TABLET BY MOUTH EVERY DAY (Patient taking differently: Take 25 mg by mouth daily. ), Disp: 90 tablet, Rfl: 1 .  naproxen (NAPROSYN) 500 MG tablet, TAKE 1 TABLET BY MOUTH TWICE A DAY AS NEEDED (Patient taking differently: Take 500 mg by mouth 2 (two) times daily as needed for moderate pain. ), Disp: 60 tablet, Rfl: 0 .  ondansetron (ZOFRAN) 8 MG tablet, Take 1 tablet (8 mg total) by mouth every 8 (eight) hours as needed for nausea or vomiting., Disp: 20 tablet,  Rfl: 0 .  ONETOUCH VERIO test strip, CHECK BLOOD SUGAR TWICE A DAY BEFORE BREAKFAST AND DINNER, Disp: 100 each, Rfl: 7 .  palbociclib (IBRANCE) 125 MG tablet, Take 1 tablet (125 mg total) by mouth daily. Take for 21 days on, 7 days off, repeat every 28 days., Disp: 21 tablet, Rfl: 1 .  pantoprazole (PROTONIX) 40 MG tablet, Take 1 tablet (40 mg total) by mouth daily. (Patient not taking: Reported on 09/12/2019), Disp: 30 tablet, Rfl: 3 .  pioglitazone-metformin (ACTOPLUS MET) 15-850 MG tablet, Take 1 tablet by mouth daily., Disp: , Rfl:  .  polyethylene glycol (MIRALAX / GLYCOLAX) 17 g packet, Take 17 g by mouth daily., Disp: , Rfl:  .  potassium chloride (KLOR-CON) 10 MEQ tablet, Take 10 mEq by mouth daily., Disp: , Rfl:  .  rosuvastatin (CRESTOR) 20 MG tablet, TAKE 1 TABLET BY MOUTH EVERY DAY, Disp: 30 tablet, Rfl: 0 .  Semaglutide,0.25 or 0.'5MG'$ /DOS, (OZEMPIC, 0.25 OR 0.5 MG/DOSE,) 2 MG/1.5ML SOPN, Inject 0.5 mg into the skin once a week. (Patient taking differently: Inject 0.5 mg into the skin every Friday. ), Disp: 9 mL, Rfl: 1 No current facility-administered medications for this visit.  Facility-Administered Medications Ordered in Other Visits:  .  sodium chloride 0.9 % injection 10 mL, 10 mL, Intravenous, PRN, Marcy Panning, MD, 10 mL at 05/24/13 1522   Allergies  Allergen Reactions  . Cephalexin Other (See Comments)    Burned throat.  . Adhesive [Tape] Rash     Clear tape gloves ,elastic no problem  . Latex Hives and Rash     Review of Systems  Constitutional: Negative for fatigue.  Eyes: Negative for blurred vision.  Respiratory: Negative.  Negative for cough.   Cardiovascular: Negative.  Negative for chest pain, palpitations and leg swelling.  Neurological: Negative for dizziness and headaches.     There were no vitals filed for this visit. There is no height or weight on file to calculate BMI.   Objective:  Physical Exam Constitutional:      General: She is not in acute distress.    Appearance: Normal appearance. She is obese.  Cardiovascular:     Rate and Rhythm: Normal rate and regular rhythm.     Pulses: Normal pulses.     Heart sounds: Normal heart sounds.  Pulmonary:     Effort: Pulmonary effort is normal. No respiratory distress.     Breath sounds: Normal breath sounds.  Chest:       Comments: [place] Musculoskeletal:        General: Tenderness (right lateral hip with range of motion) present. No swelling, deformity or signs of injury.     Right lower leg: No edema.     Left lower leg: No edema.  Skin:    General: Skin is warm and dry.     Capillary Refill: Capillary refill takes less than 2 seconds.  Neurological:     General: No focal deficit present.     Mental Status: She is alert and oriented to person, place, and time.  Psychiatric:        Mood and Affect: Mood normal.        Behavior: Behavior normal.        Thought Content: Thought content normal.        Judgment: Judgment normal.         Assessment And Plan:     1. Gastroesophageal reflux disease without esophagitis  Chronic, stable - pantoprazole (PROTONIX) 40 MG tablet;  Take 1 tablet (40 mg total) by mouth daily.  Dispense: 90 tablet; Refill: 1  2. Bipolar 1 disorder (HCC)  Chronic, stable  3. Type 2 diabetes mellitus with other specified complication, with long-term current use of insulin (HCC)  Chronic, controlled  Continue with current  medications  Encouraged to limit intake of sugary foods and drinks  Encouraged to increase physical activity to 150 minutes per week as tolerated - metFORMIN (GLUCOPHAGE) 850 MG tablet; Take 1 tablet (850 mg total) by mouth daily with breakfast.  Dispense: 90 tablet; Refill: 1 - Hemoglobin A1c - Lipid panel - BMP8+eGFR  4. Essential hypertension  Chronic, elevated today  Discussed importance of low salt diet   She is currently under increased stress due to her cancer possibly being back  5. Edema, unspecified type  Has 1+ lower extremity edema, no shortness of breath  She is to start back on her furosemide as needed - furosemide (LASIX) 20 MG tablet; Take 1 tablet (20 mg total) by mouth daily as needed (for feet swelling).  Dispense: 30 tablet; Refill: 1   [name], FNP    THE PATIENT IS ENCOURAGED TO PRACTICE SOCIAL DISTANCING DUE TO THE COVID-19 PANDEMIC.

## 2019-09-18 DIAGNOSIS — E1142 Type 2 diabetes mellitus with diabetic polyneuropathy: Secondary | ICD-10-CM | POA: Diagnosis not present

## 2019-09-18 DIAGNOSIS — M2042 Other hammer toe(s) (acquired), left foot: Secondary | ICD-10-CM | POA: Diagnosis not present

## 2019-09-18 DIAGNOSIS — M2041 Other hammer toe(s) (acquired), right foot: Secondary | ICD-10-CM | POA: Diagnosis not present

## 2019-09-18 LAB — BMP8+EGFR
BUN/Creatinine Ratio: 11 — ABNORMAL LOW (ref 12–28)
BUN: 14 mg/dL (ref 8–27)
CO2: 27 mmol/L (ref 20–29)
Calcium: 9.9 mg/dL (ref 8.7–10.3)
Chloride: 93 mmol/L — ABNORMAL LOW (ref 96–106)
Creatinine, Ser: 1.27 mg/dL — ABNORMAL HIGH (ref 0.57–1.00)
GFR calc Af Amer: 51 mL/min/{1.73_m2} — ABNORMAL LOW (ref >59)
GFR calc non Af Amer: 44 mL/min/{1.73_m2} — ABNORMAL LOW (ref >59)
Glucose: 195 mg/dL — ABNORMAL HIGH (ref 65–99)
Potassium: 3 mmol/L — ABNORMAL LOW (ref 3.5–5.2)
Sodium: 139 mmol/L (ref 134–144)

## 2019-09-18 LAB — LIPID PANEL
Chol/HDL Ratio: 19.8 ratio — ABNORMAL HIGH (ref 0.0–4.4)
Cholesterol, Total: 238 mg/dL — ABNORMAL HIGH (ref 100–199)
HDL: 12 mg/dL — ABNORMAL LOW (ref >39)
LDL Chol Calc (NIH): 197 mg/dL — ABNORMAL HIGH (ref 0–99)
Triglycerides: 153 mg/dL — ABNORMAL HIGH (ref 0–149)
VLDL Cholesterol Cal: 29 mg/dL (ref 5–40)

## 2019-09-18 LAB — HEMOGLOBIN A1C
Est. average glucose Bld gHb Est-mCnc: 108 mg/dL
Hgb A1c MFr Bld: 5.4 % (ref 4.8–5.6)

## 2019-09-19 ENCOUNTER — Ambulatory Visit: Payer: Self-pay

## 2019-09-19 DIAGNOSIS — C778 Secondary and unspecified malignant neoplasm of lymph nodes of multiple regions: Secondary | ICD-10-CM

## 2019-09-19 NOTE — Progress Notes (Signed)
Okay see if she can come back in 1 week for recheck BMP

## 2019-09-19 NOTE — Progress Notes (Signed)
Noted  

## 2019-09-19 NOTE — Chronic Care Management (AMB) (Signed)
  Chronic Care Management   Outreach Note  09/19/2019 Name: Kathy Maxwell MRN: [id number] DOB: Dec 05, 1955  Referred by: [name], FNP Reason for referral : Care Coordination   SW placed a successful outreach to the patient to assist with care coordination needs. The patient reports she is doing well with no acute resource needs at this time. The patient reports upcomming biopsy scheduled, the patients sister will assist with transportation.  Follow Up Plan: SW will follow up with the patient over the next 4-6 weeks. SW has encouraged the patient to contact SW if needed prior to next scheduled outreach.  Daneen Schick, BSW, CDP Social Worker, Certified Dementia Practitioner [place] / [place] [facility] [phone]

## 2019-09-23 ENCOUNTER — Ambulatory Visit (INDEPENDENT_AMBULATORY_CARE_PROVIDER_SITE_OTHER): Payer: Medicare Other

## 2019-09-23 ENCOUNTER — Telehealth: Payer: Self-pay

## 2019-09-23 ENCOUNTER — Encounter (HOSPITAL_COMMUNITY): Payer: Self-pay

## 2019-09-23 ENCOUNTER — Other Ambulatory Visit: Payer: Self-pay

## 2019-09-23 ENCOUNTER — Ambulatory Visit (HOSPITAL_COMMUNITY)
Admission: RE | Admit: 2019-09-23 | Discharge: 2019-09-23 | Disposition: A | Discharge status: Home / Self Care | Payer: Medicare Other | Source: Ambulatory Visit | Attending: Oncology | Admitting: Oncology

## 2019-09-23 DIAGNOSIS — Z7984 Long term (current) use of oral hypoglycemic drugs: Secondary | ICD-10-CM | POA: Diagnosis not present

## 2019-09-23 DIAGNOSIS — I10 Essential (primary) hypertension: Secondary | ICD-10-CM

## 2019-09-23 DIAGNOSIS — F319 Bipolar disorder, unspecified: Secondary | ICD-10-CM | POA: Diagnosis not present

## 2019-09-23 DIAGNOSIS — I2510 Atherosclerotic heart disease of native coronary artery without angina pectoris: Secondary | ICD-10-CM | POA: Insufficient documentation

## 2019-09-23 DIAGNOSIS — Z9221 Personal history of antineoplastic chemotherapy: Secondary | ICD-10-CM | POA: Insufficient documentation

## 2019-09-23 DIAGNOSIS — Z7982 Long term (current) use of aspirin: Secondary | ICD-10-CM | POA: Diagnosis not present

## 2019-09-23 DIAGNOSIS — E1169 Type 2 diabetes mellitus with other specified complication: Secondary | ICD-10-CM

## 2019-09-23 DIAGNOSIS — Z79899 Other long term (current) drug therapy: Secondary | ICD-10-CM | POA: Diagnosis not present

## 2019-09-23 DIAGNOSIS — E119 Type 2 diabetes mellitus without complications: Secondary | ICD-10-CM | POA: Insufficient documentation

## 2019-09-23 DIAGNOSIS — Z170 Estrogen receptor positive status [ER+]: Secondary | ICD-10-CM

## 2019-09-23 DIAGNOSIS — Z7901 Long term (current) use of anticoagulants: Secondary | ICD-10-CM | POA: Insufficient documentation

## 2019-09-23 DIAGNOSIS — D0511 Intraductal carcinoma in situ of right breast: Secondary | ICD-10-CM

## 2019-09-23 DIAGNOSIS — E785 Hyperlipidemia, unspecified: Secondary | ICD-10-CM | POA: Insufficient documentation

## 2019-09-23 DIAGNOSIS — Z87891 Personal history of nicotine dependence: Secondary | ICD-10-CM | POA: Insufficient documentation

## 2019-09-23 DIAGNOSIS — C787 Secondary malignant neoplasm of liver and intrahepatic bile duct: Secondary | ICD-10-CM | POA: Diagnosis not present

## 2019-09-23 DIAGNOSIS — C229 Malignant neoplasm of liver, not specified as primary or secondary: Secondary | ICD-10-CM | POA: Diagnosis not present

## 2019-09-23 DIAGNOSIS — Z853 Personal history of malignant neoplasm of breast: Secondary | ICD-10-CM | POA: Diagnosis not present

## 2019-09-23 DIAGNOSIS — C778 Secondary and unspecified malignant neoplasm of lymph nodes of multiple regions: Secondary | ICD-10-CM

## 2019-09-23 DIAGNOSIS — Z923 Personal history of irradiation: Secondary | ICD-10-CM | POA: Insufficient documentation

## 2019-09-23 DIAGNOSIS — R109 Unspecified abdominal pain: Secondary | ICD-10-CM | POA: Diagnosis not present

## 2019-09-23 DIAGNOSIS — K219 Gastro-esophageal reflux disease without esophagitis: Secondary | ICD-10-CM | POA: Insufficient documentation

## 2019-09-23 DIAGNOSIS — C801 Malignant (primary) neoplasm, unspecified: Secondary | ICD-10-CM | POA: Diagnosis not present

## 2019-09-23 DIAGNOSIS — C50919 Malignant neoplasm of unspecified site of unspecified female breast: Secondary | ICD-10-CM | POA: Diagnosis not present

## 2019-09-23 DIAGNOSIS — C50412 Malignant neoplasm of upper-outer quadrant of left female breast: Secondary | ICD-10-CM

## 2019-09-23 LAB — GLUCOSE, CAPILLARY: Glucose-Capillary: 222 mg/dL — ABNORMAL HIGH (ref 70–99)

## 2019-09-23 LAB — CBC
HCT: 33.5 % — ABNORMAL LOW (ref 36.0–46.0)
Hemoglobin: 10.9 g/dL — ABNORMAL LOW (ref 12.0–15.0)
MCH: 30.8 pg (ref 26.0–34.0)
MCHC: 32.5 g/dL (ref 30.0–36.0)
MCV: 94.6 fL (ref 80.0–100.0)
Platelets: 232 10*3/uL (ref 150–400)
RBC: 3.54 MIL/uL — ABNORMAL LOW (ref 3.87–5.11)
RDW: 26.5 % — ABNORMAL HIGH (ref 11.5–15.5)
WBC: 11.8 10*3/uL — ABNORMAL HIGH (ref 4.0–10.5)
nRBC: 0.2 % (ref 0.0–0.2)

## 2019-09-23 LAB — PROTIME-INR
INR: 1.6 — ABNORMAL HIGH (ref 0.8–1.2)
Prothrombin Time: 18.5 seconds — ABNORMAL HIGH (ref 11.4–15.2)

## 2019-09-23 MED ORDER — MIDAZOLAM HCL 2 MG/2ML IJ SOLN
INTRAMUSCULAR | Status: AC
  Filled 2019-09-23: qty 2

## 2019-09-23 MED ORDER — SODIUM CHLORIDE 0.9% FLUSH: 10.0000 mL | Freq: Two times a day (BID) | INTRAVENOUS | Status: DC

## 2019-09-23 MED ORDER — FENTANYL CITRATE (PF) 100 MCG/2ML IJ SOLN
INTRAMUSCULAR | Status: AC
  Filled 2019-09-23: qty 2

## 2019-09-23 MED ORDER — CHLORHEXIDINE GLUCONATE CLOTH 2 % EX PADS: 6.0000 | MEDICATED_PAD | Freq: Every day | CUTANEOUS | Status: DC

## 2019-09-23 MED ORDER — HEPARIN SOD (PORK) LOCK FLUSH 100 UNIT/ML IV SOLN
500.0000 [IU] | INTRAVENOUS | Status: AC | PRN
  Administered 2019-09-23: 15:00:00 500 [IU]
  Filled 2019-09-23: qty 5

## 2019-09-23 MED ORDER — SODIUM CHLORIDE 0.9 % IV SOLN: INTRAVENOUS | Status: DC

## 2019-09-23 MED ORDER — SODIUM CHLORIDE 0.9% FLUSH: 10.0000 mL | INTRAVENOUS | Status: DC | PRN

## 2019-09-23 MED ORDER — LIDOCAINE HCL (PF) 1 % IJ SOLN
INTRAMUSCULAR | Status: AC
  Filled 2019-09-23: qty 30

## 2019-09-23 MED ORDER — GELATIN ABSORBABLE 12-7 MM EX MISC
CUTANEOUS | Status: AC
  Filled 2019-09-23: qty 1

## 2019-09-23 MED ORDER — MIDAZOLAM HCL 2 MG/2ML IJ SOLN
INTRAMUSCULAR | Status: AC | PRN
  Administered 2019-09-23: 0.5 mg via INTRAVENOUS

## 2019-09-23 MED ORDER — FENTANYL CITRATE (PF) 100 MCG/2ML IJ SOLN
INTRAMUSCULAR | Status: AC | PRN
  Administered 2019-09-23: 25 ug via INTRAVENOUS

## 2019-09-23 NOTE — Progress Notes (Signed)
Discharge instructions given to patient and sister [place].

## 2019-09-23 NOTE — H&P (Signed)
Chief Complaint: Patient was seen in consultation today for liver lesion biopsy at the request of Maxwell,Kathy C  Referring Physician(s): Chauncey Cruel  Supervising Physician: Corrie Mckusick  Patient Status: [facility] - Out-pt  History of Present Illness: Kathy Maxwell is a 64 y.o. 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  She then underwent abdomen/pelvis CT, which revealed: hepatomegaly and lesions consistent with extensive hepatic metastatic disease and trace perihepatic ascites. A chest CT obtained during that visit showed: large right hilar lymph node concerning for lymphatic spread. The first thing we need is a biopsy, which should be obtained from the liver.  This may not be breast cancer but a different cancer.  If so it would be treated completely different.  If it is breast cancer we would want to know if it is still estrogen dependent and if so we would likely treat her with fulvestrant and palbociclib or a similar combination.  Outside films reviewed by Dr Valentina Shaggy liver lesion biopsy Scheduled now for same  Pt with some abd pain No N/V No fever    Past Medical History:  Diagnosis Date  . Allergy    latex  . Arthritis   . Bipolar 1 disorder ([place])   . Bipolar 1 disorder ([place]) 04/04/2013  . Blood transfusion without reported diagnosis    2014 during chemotherapy  . Breast cancer ([place]) 03/10/13   left breast invasive ca  . Cancer ([place])   . Coronary artery disease   . Diabetes mellitus without complication (HCC)    NIDDM  . GERD (gastroesophageal reflux disease)   . Heart murmur   .  Hyperlipidemia   . Hyperlipidemia 04/04/2013  . Hypertension   . Hypertension 04/04/2013  . Obesity   . Personal history of chemotherapy   . Personal history of radiation therapy   . Radiation 01/30/14-03/19/14  . Sleep apnea    no cpap due to insurance    Past Surgical History:  Procedure Laterality Date  . ABDOMINAL HYSTERECTOMY    . BREAST BIOPSY Left 05/08/09   fibroadenoma with microcalcifications,no malignancy id  . BREAST BIOPSY Left 03/07/13   invasive ca,1 lymph node metastatic  . 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 BREAST LUMPECTOMY Right 2014  . BREAST LUMPECTOMY WITH NEEDLE LOCALIZATION AND AXILLARY SENTINEL LYMPH NODE BX Right 04/22/2013   Procedure: BREAST LUMPECTOMY WITH NEEDLE LOCALIZATION AND AXILLARY SENTINEL LYMPH NODE BX;  Surgeon: Adin Hector, MD;  Location: [place];  Service: General;  Laterality: Right;  . CARDIAC CATHETERIZATION    . CHOLECYSTECTOMY    . CORONARY ANGIOPLASTY     LAD stent 2003  . CORONARY STENT PLACEMENT  2001  . ESOPHAGOGASTRODUODENOSCOPY N/A 08/07/2013   Procedure: ESOPHAGOGASTRODUODENOSCOPY (EGD);  Surgeon: Missy Sabins, MD;  Location: [facility];  Service: Endoscopy;  Laterality: N/A;  . ESOPHAGOGASTRODUODENOSCOPY N/A 11/13/2013   Procedure: ESOPHAGOGASTRODUODENOSCOPY (EGD);  Surgeon: Wonda Horner, MD;  Location: [facility] ENDOSCOPY;  Service: Endoscopy;  Laterality: N/A;  . EYE SURGERY  6/13,9/14   laser,cataract lft  . MASTECTOMY Left 2014  . MASTECTOMY MODIFIED RADICAL Left 04/22/2013   Procedure: MASTECTOMY MODIFIED  RADICAL;  Surgeon: Adin Hector, MD;  Location: [place];  Service: General;  Laterality: Left;  . PORTACATH PLACEMENT Right 04/22/2013   Procedure: INSERTION PORT-A-CATH;  Surgeon: Adin Hector, MD;  Location: [place];  Service: General;  Laterality: Right;    Allergies: Cephalexin, Adhesive [tape], and Latex  Medications: Prior to Admission medications     Medication Sig Start Date End Date Taking? Authorizing Provider  acetaminophen (TYLENOL) 500 MG tablet Take 1,000 mg by mouth every 6 (six) hours as needed for moderate pain.   Yes [provider]  amLODipine (NORVASC) 10 MG tablet TAKE 1 TABLET BY MOUTH EVERY DAY Patient taking differently: Take 10 mg by mouth daily.  02/15/19  Yes [name], FNP  aspirin EC 81 MG tablet Take 81 mg by mouth daily.   Yes [provider]  buPROPion (WELLBUTRIN SR) 150 MG 12 hr tablet TAKE 1 TABLET BY MOUTH EVERY DAY Patient taking differently: Take 150 mg by mouth daily.  04/27/19  Yes [name], FNP  cholecalciferol (VITAMIN D3) 25 MCG (1000 UT) tablet Take 1 tablet (1,000 Units total) by mouth daily. 10/08/18  Yes Maxwell, Virgie Dad, MD  docusate sodium (COLACE) 100 MG capsule Take 100 mg by mouth daily as needed for mild constipation.   Yes [provider]  fluticasone (FLONASE) 50 MCG/ACT nasal spray Place 1 spray into both nostrils daily as needed for allergies or rhinitis.   Yes [provider]  furosemide (LASIX) 20 MG tablet Take 1 tablet (20 mg total) by mouth daily as needed (for feet swelling). 09/17/19  Yes [name], FNP  gabapentin (NEURONTIN) 100 MG capsule TAKE 1 CAPSULE BY MOUTH 3 TIMES A DAY Patient taking differently: Take 100 mg by mouth 3 (three) times daily.  10/30/18  Yes Maxwell, Virgie Dad, MD  hydrALAZINE (APRESOLINE) 50 MG tablet Take 50 mg by mouth in the morning and at bedtime.   Yes [provider]  HYDROcodone-acetaminophen (NORCO/VICODIN) 5-325 MG tablet Take 1 tablet by mouth every 4 (four) hours as needed for moderate pain.   Yes [provider]  losartan (COZAAR) 100 MG tablet Take 1 tablet (100 mg total) by mouth daily. 04/18/19  Yes [name], FNP  metFORMIN (GLUCOPHAGE) 850 MG tablet Take 1 tablet (850 mg total) by mouth daily with breakfast. 09/17/19  Yes [name], FNP  metoprolol succinate (TOPROL-XL) 25 MG 24  hr tablet TAKE 1 TABLET BY MOUTH EVERY DAY Patient taking differently: Take 25 mg by mouth daily.  06/26/19  Yes [name], FNP  naproxen (NAPROSYN) 500 MG tablet TAKE 1 TABLET BY MOUTH TWICE A DAY AS NEEDED Patient taking differently: Take 500 mg by mouth 2 (two) times daily as needed for moderate pain.  03/04/19  Yes [name], FNP  ondansetron (ZOFRAN) 8 MG tablet Take 1 tablet (8 mg total) by mouth every 8 (eight) hours as needed for nausea or vomiting. 08/23/19  Yes Maxwell, Virgie Dad, MD  pantoprazole (PROTONIX) 40 MG tablet Take 1 tablet (40 mg total) by mouth daily. 09/17/19  Yes [name], FNP  polyethylene glycol (MIRALAX / GLYCOLAX) 17 g packet Take 17 g by mouth daily.   Yes [provider]  potassium chloride (KLOR-CON) 10 MEQ tablet Take 10 mEq by mouth daily.   Yes [provider]  rosuvastatin (CRESTOR) 20 MG tablet TAKE 1 TABLET BY MOUTH EVERY DAY 09/17/19  Yes Jerline Pain, MD  Semaglutide,0.25 or 0.5MG/DOS, (OZEMPIC, 0.25 OR 0.5 MG/DOSE,)  2 MG/1.5ML SOPN Inject 0.5 mg into the skin once a week. Patient taking differently: Inject 0.5 mg into the skin every Friday.  06/12/19  Yes [name], FNP  lidocaine-prilocaine (EMLA) cream Apply topically as needed. Apply to port-a-cath one - two hours before accessing. Patient taking differently: Apply 1 application topically as needed (Apply to port-a-cath one - two hours before accessing.).  07/16/19   [name], FNP  ONETOUCH VERIO test strip CHECK BLOOD SUGAR TWICE A DAY BEFORE BREAKFAST AND DINNER 11/19/18   [name], FNP  palbociclib [facility]) 125 MG tablet Take 1 tablet (125 mg total) by mouth daily. Take for 21 days on, 7 days off, repeat every 28 days. 09/02/19   Maxwell, Virgie Dad, MD     Family History  Problem Relation Age of Onset  . Heart disease Mother   . Breast cancer Maternal Aunt        dx in her 69s  . Heart disease Cousin 75       maternal cousin  . Heart attack Sister 59  .  Cancer Paternal Aunt        NOS  . Cancer Maternal Aunt        NOS  . Lung cancer Cousin 61       maternal cousin - non smoker  . Brain cancer Cousin 13       maternal cousin's son  . Lung cancer Cousin 58       maternal cousin's son  . Breast cancer Cousin        paternal cousin  . Colon cancer Neg Hx     Social History   Socioeconomic History  . Marital status: Single    Spouse name: Not on file  . Number of children: 0  . Years of education: Not on file  . Highest education level: Not on file  Occupational History  . Occupation: DISABLED    Employer: UNEMPLOYED  Tobacco Use  . Smoking status: Former Smoker    Packs/day: 1.00    Years: 31.00    Pack years: 31.00    Types: Cigarettes    Quit date: 07/19/1987    Years since quitting: 32.2  . Smokeless tobacco: Never Used  Substance and Sexual Activity  . Alcohol use: No    Alcohol/week: 0.0 standard drinks  . Drug use: No  . Sexual activity: Not Currently    Birth control/protection: Surgical  Other Topics Concern  . Not on file  Social History Narrative  . Not on file   Social Determinants of Health   Financial Resource Strain: Low Risk   . Difficulty of Paying Living Expenses: Not very hard  Food Insecurity: No Food Insecurity  . Worried About Charity fundraiser in the Last Year: Never true  . Ran Out of Food in the Last Year: Never true  Transportation Needs:   . Lack of Transportation (Medical): Not on file  . Lack of Transportation (Non-Medical): Not on file  Physical Activity: Insufficiently Active  . Days of Exercise per Week: 3 days  . Minutes of Exercise per Session: 10 min  Stress:   . Feeling of Stress : Not on file  Social Connections:   . Frequency of Communication with Friends and Family: Not on file  . Frequency of Social Gatherings with Friends and Family: Not on file  . Attends Religious Services: Not on file  . Active Member of Clubs or Organizations: Not on file  . Attends Club or  Organization Meetings: Not on file  . Marital Status: Not on file    Review of Systems: A 12 point ROS discussed and pertinent positives are indicated in the HPI above.  All other systems are negative.  Review of Systems  Constitutional: Positive for activity change, appetite change and fatigue. Negative for fever.  Respiratory: Negative for cough and shortness of breath.   Cardiovascular: Negative for chest pain.  Gastrointestinal: Positive for abdominal pain.  Neurological: Positive for weakness.  Psychiatric/Behavioral: Negative for behavioral problems and confusion.    Vital Signs: BP (!) 177/79   Pulse (!) 103   Temp 97.9 F (36.6 C) (Skin)   Resp 16   Ht 5' 2" (1.575 m)   Wt 194 lb (88 kg)   SpO2 96%   BMI 35.48 kg/m   Physical Exam Vitals reviewed.  Cardiovascular:     Rate and Rhythm: Normal rate and regular rhythm.     Heart sounds: Murmur present.  Pulmonary:     Breath sounds: Normal breath sounds.  Abdominal:     Palpations: Abdomen is soft.     Tenderness: There is abdominal tenderness.  Musculoskeletal:        General: Normal range of motion.  Skin:    General: Skin is warm and dry.  Neurological:     Mental Status: She is alert and oriented to person, place, and time.  Psychiatric:        Behavior: Behavior normal.        Thought Content: Thought content normal.        Judgment: Judgment normal.     Imaging: No results found.  Labs:  CBC: Recent Labs    08/30/19 0848  WBC 14.0*  HGB 12.1  HCT 37.5  PLT 286    COAGS: No results for input(s): INR, APTT in the last 8760 hours.  BMP: Recent Labs    03/07/19 1416 06/12/19 1145 08/30/19 0848 09/17/19 1209  NA 136 140 139 139  K 4.1 4.1 3.3* 3.0*  CL 104 102 99 93*  CO2 _0 GLUCOSE 263* 124* 202* 195*  BUN 25* _1 CALCIUM 9.4 10.1 9.0 9.9  CREATININE 1.03* 1.15* 1.43* 1.27*  GFRNONAA 57* 50* 38* 44*  GFRAA >60 58* 44* 51*    LIVER FUNCTION TESTS: Recent  Labs    03/07/19 1404 03/07/19 1416 06/12/19 1145 08/30/19 0848  BILITOT 0.2 0.3 0.3 4.8*  AST 10 11* 18 491*  ALT _2 139*  ALKPHOS 123* 122 178* 591*  PROT 6.4 7.0 7.6 7.2  ALBUMIN 4.0 3.5 4.7 3.0*    TUMOR MARKERS: No results for input(s): AFPTM, CEA, CA199, CHROMGRNA in the last 8760 hours.  Assessment and Plan:  Hx Breast Ca 2105 New abd pain  Imaging revealing probable metastasis Scheduled now for liver lesion biopsy Risks and benefits of liver lesion biopsy was discussed with the patient and/or patient's family including, but not limited to bleeding, infection, damage to adjacent structures or low yield requiring additional tests.  All of the questions were answered and there is agreement to proceed. Consent signed and in chart.  Thank you for this interesting consult.  I greatly enjoyed meeting SHAKURA COWING and look forward to participating in their care.  A copy of this report was sent to the requesting provider on this date.  Electronically Signed: Lavonia Drafts, PA-C 09/23/2019, 12:05 PM   I spent a total of  30 Minutes  in face to face in clinical consultation, greater than 50% of which was counseling/coordinating care for liver lesion bx

## 2019-09-23 NOTE — Discharge Instructions (Signed)

## 2019-09-23 NOTE — Procedures (Signed)
Interventional Radiology Procedure Note  Procedure: US guided liver mass biopsy. Mx 18g core  Findings:  Diffuse abnormality of liver, with no discrete lesion identified on [name].  Targeted the most abnormal area of right liver.  .  Complications: None  Recommendations:  - 2 hrs dc home when goals met - advance diet - Do not submerge for 7 days - Routine care   Signed,  Dulcy Fanny. Earleen Newport, DO

## 2019-09-24 ENCOUNTER — Other Ambulatory Visit: Payer: Medicare Other

## 2019-09-24 DIAGNOSIS — E872 Acidosis: Secondary | ICD-10-CM | POA: Diagnosis not present

## 2019-09-24 DIAGNOSIS — Z923 Personal history of irradiation: Secondary | ICD-10-CM | POA: Diagnosis not present

## 2019-09-24 DIAGNOSIS — Z7984 Long term (current) use of oral hypoglycemic drugs: Secondary | ICD-10-CM | POA: Diagnosis not present

## 2019-09-24 DIAGNOSIS — R109 Unspecified abdominal pain: Secondary | ICD-10-CM | POA: Diagnosis not present

## 2019-09-24 DIAGNOSIS — E875 Hyperkalemia: Secondary | ICD-10-CM | POA: Diagnosis not present

## 2019-09-24 DIAGNOSIS — R0781 Pleurodynia: Secondary | ICD-10-CM | POA: Diagnosis not present

## 2019-09-24 DIAGNOSIS — R9431 Abnormal electrocardiogram [ECG] [EKG]: Secondary | ICD-10-CM | POA: Diagnosis not present

## 2019-09-24 DIAGNOSIS — E8770 Fluid overload, unspecified: Secondary | ICD-10-CM | POA: Diagnosis not present

## 2019-09-24 DIAGNOSIS — I509 Heart failure, unspecified: Secondary | ICD-10-CM | POA: Diagnosis not present

## 2019-09-24 DIAGNOSIS — R079 Chest pain, unspecified: Secondary | ICD-10-CM | POA: Diagnosis not present

## 2019-09-24 DIAGNOSIS — N289 Disorder of kidney and ureter, unspecified: Secondary | ICD-10-CM | POA: Diagnosis not present

## 2019-09-24 DIAGNOSIS — Z9221 Personal history of antineoplastic chemotherapy: Secondary | ICD-10-CM | POA: Diagnosis not present

## 2019-09-24 DIAGNOSIS — C50912 Malignant neoplasm of unspecified site of left female breast: Secondary | ICD-10-CM | POA: Diagnosis not present

## 2019-09-24 DIAGNOSIS — C787 Secondary malignant neoplasm of liver and intrahepatic bile duct: Secondary | ICD-10-CM | POA: Diagnosis not present

## 2019-09-24 DIAGNOSIS — G893 Neoplasm related pain (acute) (chronic): Secondary | ICD-10-CM | POA: Diagnosis not present

## 2019-09-24 DIAGNOSIS — R54 Age-related physical debility: Secondary | ICD-10-CM | POA: Diagnosis not present

## 2019-09-24 DIAGNOSIS — C50919 Malignant neoplasm of unspecified site of unspecified female breast: Secondary | ICD-10-CM | POA: Diagnosis not present

## 2019-09-24 DIAGNOSIS — I080 Rheumatic disorders of both mitral and aortic valves: Secondary | ICD-10-CM | POA: Diagnosis not present

## 2019-09-24 DIAGNOSIS — Z7982 Long term (current) use of aspirin: Secondary | ICD-10-CM | POA: Diagnosis not present

## 2019-09-24 DIAGNOSIS — I5031 Acute diastolic (congestive) heart failure: Secondary | ICD-10-CM | POA: Diagnosis not present

## 2019-09-24 DIAGNOSIS — R17 Unspecified jaundice: Secondary | ICD-10-CM | POA: Diagnosis not present

## 2019-09-24 DIAGNOSIS — E7800 Pure hypercholesterolemia, unspecified: Secondary | ICD-10-CM | POA: Diagnosis not present

## 2019-09-24 DIAGNOSIS — Z20822 Contact with and (suspected) exposure to covid-19: Secondary | ICD-10-CM | POA: Diagnosis not present

## 2019-09-24 DIAGNOSIS — E785 Hyperlipidemia, unspecified: Secondary | ICD-10-CM | POA: Diagnosis not present

## 2019-09-24 DIAGNOSIS — R7401 Elevation of levels of liver transaminase levels: Secondary | ICD-10-CM | POA: Diagnosis not present

## 2019-09-24 DIAGNOSIS — I110 Hypertensive heart disease with heart failure: Secondary | ICD-10-CM | POA: Diagnosis not present

## 2019-09-24 DIAGNOSIS — R0602 Shortness of breath: Secondary | ICD-10-CM | POA: Diagnosis not present

## 2019-09-24 DIAGNOSIS — Z9012 Acquired absence of left breast and nipple: Secondary | ICD-10-CM | POA: Diagnosis not present

## 2019-09-24 DIAGNOSIS — R7402 Elevation of levels of lactic acid dehydrogenase (LDH): Secondary | ICD-10-CM | POA: Diagnosis not present

## 2019-09-24 DIAGNOSIS — E1122 Type 2 diabetes mellitus with diabetic chronic kidney disease: Secondary | ICD-10-CM | POA: Diagnosis not present

## 2019-09-24 DIAGNOSIS — C7981 Secondary malignant neoplasm of breast: Secondary | ICD-10-CM | POA: Diagnosis not present

## 2019-09-24 DIAGNOSIS — E119 Type 2 diabetes mellitus without complications: Secondary | ICD-10-CM | POA: Diagnosis not present

## 2019-09-24 DIAGNOSIS — R590 Localized enlarged lymph nodes: Secondary | ICD-10-CM | POA: Diagnosis not present

## 2019-09-24 DIAGNOSIS — J9811 Atelectasis: Secondary | ICD-10-CM | POA: Diagnosis not present

## 2019-09-24 NOTE — Chronic Care Management (AMB) (Addendum)
Chronic Care Management   Follow Up Note   09/24/2019 Name: ARIENNA BENEGAS MRN: [id number] DOB: 1955-12-27  Referred by: [name], FNP Reason for referral : Chronic Care Management (FU Call - DM, HTN, R hip pain )   DESARE DUDDY is a 64 y.o. year old female who is a primary care patient of [name], [place]. The CCM team was consulted for assistance with chronic disease management and care coordination needs.    Review of patient status, including review of consultants reports, relevant laboratory and other test results, and collaboration with appropriate care team members and the patient's provider was performed as part of comprehensive patient evaluation and provision of chronic care management services.    SDOH (Social Determinants of Health) assessments performed: Yes;  No SDOH needs identified today.    Placed outbound call to patient for a CCM RN CM follow up.     Outpatient Encounter Medications as of 09/23/2019  Medication Sig Note  . acetaminophen (TYLENOL) 500 MG tablet Take 1,000 mg by mouth every 6 (six) hours as needed for moderate pain.   [name] amLODipine (NORVASC) 10 MG tablet TAKE 1 TABLET BY MOUTH EVERY DAY (Patient taking differently: Take 10 mg by mouth daily. )   . aspirin EC 81 MG tablet Take 81 mg by mouth daily.   [name] buPROPion (WELLBUTRIN SR) 150 MG 12 hr tablet TAKE 1 TABLET BY MOUTH EVERY DAY (Patient taking differently: Take 150 mg by mouth daily. )   . cholecalciferol (VITAMIN D3) 25 MCG (1000 UT) tablet Take 1 tablet (1,000 Units total) by mouth daily.   [name] docusate sodium (COLACE) 100 MG capsule Take 100 mg by mouth daily as needed for mild constipation.   . fluticasone (FLONASE) 50 MCG/ACT nasal spray Place 1 spray into both nostrils daily as needed for allergies or rhinitis.   . furosemide (LASIX) 20 MG tablet Take 1 tablet (20 mg total) by mouth daily as needed (for feet swelling).   . gabapentin (NEURONTIN) 100 MG capsule TAKE 1 CAPSULE BY MOUTH 3 TIMES A  DAY (Patient taking differently: Take 100 mg by mouth 3 (three) times daily. )   . hydrALAZINE (APRESOLINE) 50 MG tablet Take 50 mg by mouth in the morning and at bedtime.   [name] HYDROcodone-acetaminophen (NORCO/VICODIN) 5-325 MG tablet Take 1 tablet by mouth every 4 (four) hours as needed for moderate pain.   [name] lidocaine-prilocaine (EMLA) cream Apply topically as needed. Apply to port-a-cath one - two hours before accessing. (Patient taking differently: Apply 1 application topically as needed (Apply to port-a-cath one - two hours before accessing.). )   . losartan (COZAAR) 100 MG tablet Take 1 tablet (100 mg total) by mouth daily.   . metFORMIN (GLUCOPHAGE) 850 MG tablet Take 1 tablet (850 mg total) by mouth daily with breakfast.   . metoprolol succinate (TOPROL-XL) 25 MG 24 hr tablet TAKE 1 TABLET BY MOUTH EVERY DAY (Patient taking differently: Take 25 mg by mouth daily. )   . naproxen (NAPROSYN) 500 MG tablet TAKE 1 TABLET BY MOUTH TWICE A DAY AS NEEDED (Patient taking differently: Take 500 mg by mouth 2 (two) times daily as needed for moderate pain. )   . ondansetron (ZOFRAN) 8 MG tablet Take 1 tablet (8 mg total) by mouth every 8 (eight) hours as needed for nausea or vomiting.   Glory Rosebush VERIO test strip CHECK BLOOD SUGAR TWICE A DAY BEFORE BREAKFAST AND DINNER   . palbociclib (IBRANCE) 125 MG  tablet Take 1 tablet (125 mg total) by mouth daily. Take for 21 days on, 7 days off, repeat every 28 days. 09/12/2019: Hasnt started  . pantoprazole (PROTONIX) 40 MG tablet Take 1 tablet (40 mg total) by mouth daily.   . polyethylene glycol (MIRALAX / GLYCOLAX) 17 g packet Take 17 g by mouth daily.   . potassium chloride (KLOR-CON) 10 MEQ tablet Take 10 mEq by mouth daily.   . rosuvastatin (CRESTOR) 20 MG tablet TAKE 1 TABLET BY MOUTH EVERY DAY   . Semaglutide,0.25 or 0.'5MG'$ /DOS, (OZEMPIC, 0.25 OR 0.5 MG/DOSE,) 2 MG/1.5ML SOPN Inject 0.5 mg into the skin once a week. (Patient taking differently: Inject 0.5 mg  into the skin every Friday. )    Facility-Administered Encounter Medications as of 09/23/2019  Medication  . sodium chloride 0.9 % injection 10 mL  . [DISCONTINUED] 0.9 %  sodium chloride infusion  . [DISCONTINUED] Chlorhexidine Gluconate Cloth 2 % PADS 6 each  . [DISCONTINUED] fentaNYL (SUBLIMAZE) 100 MCG/2ML injection  . [DISCONTINUED] gelatin adsorbable (GELFOAM/SURGIFOAM) 12-7 MM sponge 12-7 mm  . [DISCONTINUED] lidocaine (PF) (XYLOCAINE) 1 % injection  . [DISCONTINUED] midazolam (VERSED) 2 MG/2ML injection  . [DISCONTINUED] sodium chloride flush (NS) 0.9 % injection 10-40 mL  . [DISCONTINUED] sodium chloride flush (NS) 0.9 % injection 10-40 mL     Objective:  Lab Results  Component Value Date   HGBA1C 5.4 09/17/2019   HGBA1C 6.1 (H) 06/12/2019   HGBA1C 7.6 (H) 03/07/2019   Lab Results  Component Value Date   MICROALBUR 150 06/12/2019   LDLCALC 197 (H) 09/17/2019   CREATININE 1.27 (H) 09/17/2019   BP Readings from Last 3 Encounters:  09/23/19 (!) 171/66  09/17/19 (!) 164/98  08/30/19 (!) 172/79    Goals Addressed      Patient Stated   . 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 Impaired Physical Mobility . Poor gait/balance . Inability to exercise secondary to Right hip pain  Nurse Case Manager Clinical Goal(s):  [name] Over the next 60 days, patient will work with the Orthopedic MD to address needs related to treatment management for right hip pain  Goal Not Met . New 09/24/19 Over the next 30 days, patient will follow PCP recommendations to complete imaging of her R hip for further evaluation of R hip pain . New 09/24/19 Over the next 60 days, patient will verbalize having decreased pain and improved ROM to her R hip .  CCM RN CM Interventions:  09/24/19 call completed with patient  . Evaluation of current treatment plan related to  diagnosis and treatment of right hip and patient's adherence to plan as established by provider . Determined patient continues to have pain in her R hip; patient did not f/u with Orthopedics as previously referred . Reviewed PCP interventions during 09/17/19 OV; She has been having right hip pain limiting her from walking long periods . Will check hip xray . Administered 60 mg toradol IM also encouraged to use over the counter pain cream . - ketorolac (TORADOL) injection 60 mg . - DG HIP UNILAT WITH PELVIS 2-3 VIEWS RIGHT; Future . - DG Hip Unilat W OR W/O Pelvis 1V Right; Future . Encouraged patient to follow PCP recommendations and to keep the CCM RN CM and or PCP informed of new or persistent hip pain/Gait issues . Discussed plans  with patient for ongoing care management follow up and provided patient with direct contact information for care management team  Patient Self Care Activities:  . Self administers medications as prescribed . Attends all scheduled provider appointments . Calls pharmacy for medication refills . Attends church or other social activities . Performs ADL's independently . Performs IADL's independently . Calls provider office for new concerns or questions  Please see past updates related to this goal by clicking on the "Past Updates" button in the selected goal      . COMPLETED: "I would like to learn how to eat better to help my diabetes" (pt-stated)       Current Barriers:  [name] Knowledge Deficits related to diabetes disease process and Self Health Management  . Film/video editor.   Nurse Case Manager Clinical Goal(s):  [name] Over the next 60 days, patient will work with the CCM team to address needs related to improved adherence and understanding of diabetes disease process and Self Health management as evidence by patient will verbalize 100% adherence to taking her medications as prescribed and implementing portion control while following a diabetic friendly diet.   Goal Met . Over the next 90 days, patient will lower her A1C below 7.6% Goal Met  CCM RN CM Interventions:  09/24/19 completed with patient  . Evaluation of current treatment plan related to diabetes disease management and patient's adherence to plan as established by provider . Positive reinforcement given to patient for lowering her A1C down to 6.1; discussed patient is adhering to Meal planning and portion control  . Determined patient is adhering to taking her medications as prescribed; Ozempic 0.'5mg'$  weekly injection; newly added Metformin 850 mg daily with breakfast  . Patient encouraged to continue Self monitoring her CBG's daily before meals and report abnormal readings to the CCM team and or PCP promptly . Discussed plans with patient for ongoing care management follow up and provided patient with direct contact information for care management team  Patient Self Care Activities:  . Self administers medications as prescribed . Attends all scheduled provider appointments . Calls pharmacy for medication refills . Attends church or other social activities . Performs ADL's independently . Performs IADL's independently . Calls provider office for new concerns or questions  Please see past updates related to this goal by clicking on the "Past Updates" button in the selected goal      . "to keep my BP under control" (pt-stated)       Current Barriers:  [name] Knowledge Deficits related to Hypertension  . Chronic Disease Management support and education needs related to Hypertension   Nurse Case Manager Clinical Goal(s):  [name] Over the next 90 days, patient will work with CCM team  to address needs related to support and education related to Hypertension   CCM RN CM Interventions:  09/24/19 call completed with patient  . Evaluation of current treatment plan related to Hypertension and patient's adherence to plan as established by provider . Reviewed and discussed elevated BP noted at last  OV with PCP; Reiterated importance of restricting Sodium . Reviewed importance of taking prescribed antihypertensives exactly as prescribed without missed doses for best effectiveness  . Advised patient, providing education and rationale, to monitor blood pressure daily and record, calling the CCM team and or PCP for findings outside established parameters. Reiterated target BP <130/80 . Discussed plans with patient for ongoing care management follow up and provided patient with direct contact information for care management team  Patient Self  Care Activities:  . Self administers medications as prescribed . Attends all scheduled provider appointments . Calls pharmacy for medication refills . Performs ADL's independently . Performs IADL's independently . Calls provider office for new concerns or questions  Please see past updates related to this goal by clicking on the "Past Updates" button in the selected goal      . "to receive my liver biopsy results soon" (pt-stated)       CARE PLAN ENTRY (see longtitudinal plan of care for additional care plan information)  Current Barriers:  [name] Knowledge Deficits related to evaluation and treatment of suspected Liver mass . 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 Determined patient underwent a Liver biopsy on 09/23/19 and is awaiting the results . Discussed patient continues to have a functioning implanted port for IV access and blood draws . Encouraged patient to keep the  CCM team and PCP informed of Oncology recommendations as appropriate . Active listening provided to patient and reiterated the role of the CCM team for support, education, care coordination and resources . Discussed plans with patient for ongoing care management follow up and provided patient with direct contact information for care management team  Patient Self Care Activities:  . Self administers medications as prescribed . Attends all scheduled provider appointments . Calls pharmacy for medication refills . Performs ADL's independently . Performs IADL's independently . Calls provider office for new concerns or questions  Initial goal documentation       Plan:   Telephone follow up appointment with care management team member scheduled for: 11/05/19   Barb Merino, RN, BSN, CCM Care Management Coordinator [place] [facility]  Direct Phone: [phone]

## 2019-09-25 ENCOUNTER — Telehealth: Payer: Self-pay

## 2019-09-25 ENCOUNTER — Other Ambulatory Visit: Payer: Self-pay

## 2019-09-25 LAB — BMP8+EGFR
BUN/Creatinine Ratio: 16 (ref 12–28)
BUN: 19 mg/dL (ref 8–27)
CO2: 29 mmol/L (ref 20–29)
Calcium: 9.1 mg/dL (ref 8.7–10.3)
Chloride: 93 mmol/L — ABNORMAL LOW (ref 96–106)
Creatinine, Ser: 1.17 mg/dL — ABNORMAL HIGH (ref 0.57–1.00)
GFR calc Af Amer: 57 mL/min/{1.73_m2} — ABNORMAL LOW (ref >59)
GFR calc non Af Amer: 49 mL/min/{1.73_m2} — ABNORMAL LOW (ref >59)
Glucose: 233 mg/dL — ABNORMAL HIGH (ref 65–99)
Potassium: 3.7 mmol/L (ref 3.5–5.2)
Sodium: 139 mmol/L (ref 134–144)

## 2019-09-25 MED ORDER — GABAPENTIN 100 MG PO CAPS
100.00 | ORAL_CAPSULE | ORAL | Status: DC
Start: 2019-09-27 — End: 2019-09-25

## 2019-09-25 MED ORDER — ASPIRIN 81 MG PO TBEC
81.00 | DELAYED_RELEASE_TABLET | ORAL | Status: DC
Start: 2019-09-28 — End: 2019-09-25

## 2019-09-25 MED ORDER — POLYETHYLENE GLYCOL 3350 17 GM/SCOOP PO POWD
17.00 | ORAL | Status: DC
Start: 2019-09-28 — End: 2019-09-25

## 2019-09-25 MED ORDER — POTASSIUM CHLORIDE CRYS ER 10 MEQ PO TBCR
10.00 | EXTENDED_RELEASE_TABLET | ORAL | Status: DC
Start: 2019-09-28 — End: 2019-09-25

## 2019-09-25 MED ORDER — ONDANSETRON HCL 4 MG/2ML IJ SOLN
4.00 | INTRAMUSCULAR | Status: DC
Start: ? — End: 2019-09-25

## 2019-09-25 MED ORDER — ALBUMIN HUMAN 25 % IV SOLN
25.00 | INTRAVENOUS | Status: DC
Start: 2019-09-26 — End: 2019-09-25

## 2019-09-25 MED ORDER — DEXTROSE 50 % IV SOLN
25.00 | INTRAVENOUS | Status: DC
Start: ? — End: 2019-09-25

## 2019-09-25 MED ORDER — GLUCAGON (RDNA) 1 MG IJ KIT
1.00 | PACK | INTRAMUSCULAR | Status: DC
Start: ? — End: 2019-09-25

## 2019-09-25 MED ORDER — FUROSEMIDE 10 MG/ML IJ SOLN
40.00 | INTRAMUSCULAR | Status: DC
Start: 2019-09-26 — End: 2019-09-25

## 2019-09-25 MED ORDER — DSS 100 MG PO CAPS
100.00 | ORAL_CAPSULE | ORAL | Status: DC
Start: ? — End: 2019-09-25

## 2019-09-25 MED ORDER — METOPROLOL SUCCINATE ER 25 MG PO TB24
25.00 | ORAL_TABLET | ORAL | Status: DC
Start: 2019-09-28 — End: 2019-09-25

## 2019-09-25 MED ORDER — INSULIN GLARGINE 100 UNIT/ML SOLOSTAR PEN
10.00 | PEN_INJECTOR | SUBCUTANEOUS | Status: DC
Start: 2019-09-27 — End: 2019-09-25

## 2019-09-25 MED ORDER — AMLODIPINE BESYLATE 10 MG PO TABS
10.00 | ORAL_TABLET | ORAL | Status: DC
Start: 2019-09-28 — End: 2019-09-25

## 2019-09-25 MED ORDER — GLUCOSE 40 % PO GEL
ORAL | Status: DC
Start: ? — End: 2019-09-25

## 2019-09-25 MED ORDER — BUPROPION HCL ER (SR) 150 MG PO TB12
150.00 | ORAL_TABLET | ORAL | Status: DC
Start: 2019-09-28 — End: 2019-09-25

## 2019-09-25 MED ORDER — HYDRALAZINE HCL 25 MG PO TABS
25.00 | ORAL_TABLET | ORAL | Status: DC
Start: 2019-09-27 — End: 2019-09-25

## 2019-09-25 MED ORDER — INSULIN LISPRO (1 UNIT DIAL) 100 UNIT/ML (KWIKPEN)
0.00 | PEN_INJECTOR | SUBCUTANEOUS | Status: DC
Start: 2019-09-27 — End: 2019-09-25

## 2019-09-25 MED ORDER — FLUTICASONE PROPIONATE 50 MCG/ACT NA SUSP
1.00 | NASAL | Status: DC
Start: ? — End: 2019-09-25

## 2019-09-25 MED ORDER — ENOXAPARIN SODIUM 40 MG/0.4ML ~~LOC~~ SOLN
40.00 | SUBCUTANEOUS | Status: DC
Start: 2019-09-26 — End: 2019-09-25

## 2019-09-25 MED ORDER — MORPHINE SULFATE 2 MG/ML IJ SOLN
1.00 | INTRAMUSCULAR | Status: DC
Start: ? — End: 2019-09-25

## 2019-09-25 MED ORDER — LOSARTAN POTASSIUM 50 MG PO TABS
100.00 | ORAL_TABLET | ORAL | Status: DC
Start: 2019-09-26 — End: 2019-09-25

## 2019-09-25 MED ORDER — PANTOPRAZOLE SODIUM 40 MG PO TBEC
40.00 | DELAYED_RELEASE_TABLET | ORAL | Status: DC
Start: 2019-09-28 — End: 2019-09-25

## 2019-09-25 NOTE — Patient Instructions (Signed)
Visit Information  Goals Addressed      Patient Stated   . 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 Impaired Physical Mobility . Poor gait/balance . Inability to exercise secondary to Right hip pain  Nurse Case Manager Clinical Goal(s):  [name] Over the next 60 days, patient will work with the Orthopedic MD to address needs related to treatment management for right hip pain  Goal Not Met . New 09/24/19 Over the next 30 days, patient will follow PCP recommendations to complete imaging of her R hip for further evaluation of R hip pain . New 09/24/19 Over the next 60 days, patient will verbalize having decreased pain and improved ROM to her R hip .  CCM RN CM Interventions:  09/24/19 call completed with patient  . Evaluation of current treatment plan related to diagnosis and treatment of right hip and patient's adherence to plan as established by provider . Determined patient continues to have pain in her R hip; patient did not f/u with Orthopedics as previously referred . Reviewed PCP interventions during 09/17/19 OV; She has been having right hip pain limiting her from walking long periods . Will check hip xray . Administered 60 mg toradol IM also encouraged to use over the counter pain cream . - ketorolac (TORADOL) injection 60 mg . - DG HIP UNILAT WITH PELVIS 2-3 VIEWS RIGHT; Future . - DG Hip Unilat W OR W/O Pelvis 1V Right; Future . Encouraged patient to follow PCP recommendations and to keep the CCM RN CM and or PCP informed of new or persistent hip pain/Gait issues . Discussed plans with patient for ongoing care management follow up and provided patient with direct contact information for care management team  Patient Self Care Activities:  . Self administers medications as prescribed . Attends all scheduled provider appointments . Calls pharmacy for  medication refills . Attends church or other social activities . Performs ADL's independently . Performs IADL's independently . Calls provider office for new concerns or questions  Please see past updates related to this goal by clicking on the "Past Updates" button in the selected goal      . COMPLETED: "I would like to learn how to eat better to help my diabetes" (pt-stated)       Current Barriers:  [name] Knowledge Deficits related to diabetes disease process and Self Health Management  . Film/video editor.   Nurse Case Manager Clinical Goal(s):  [name] Over the next 60 days, patient will work with the CCM team to address needs related to improved adherence and understanding of diabetes disease process and Self Health management as evidence by patient will verbalize 100% adherence to taking her medications as prescribed and implementing portion control while following a diabetic friendly diet.  Goal Met . Over the next 90 days, patient will lower her A1C below 7.6% Goal Met  CCM RN CM Interventions:  09/24/19 completed with patient  . Evaluation of current treatment plan related to diabetes disease management and patient's adherence to plan as established by provider . Positive reinforcement given to patient for lowering her A1C down to 6.1; discussed patient is adhering to Meal planning and portion control  . Determined patient is adhering to taking her medications as prescribed; Ozempic 0.45m weekly injection; newly added Metformin 850 mg daily with breakfast  . Patient encouraged to continue Self  monitoring her CBG's daily before meals and report abnormal readings to the CCM team and or PCP promptly . Discussed plans with patient for ongoing care management follow up and provided patient with direct contact information for care management team  Patient Self Care Activities:  . Self administers medications as prescribed . Attends all scheduled provider appointments . Calls pharmacy for  medication refills . Attends church or other social activities . Performs ADL's independently . Performs IADL's independently . Calls provider office for new concerns or questions  Please see past updates related to this goal by clicking on the "Past Updates" button in the selected goal      . "to keep my BP under control" (pt-stated)       Current Barriers:  [name] Knowledge Deficits related to Hypertension  . Chronic Disease Management support and education needs related to Hypertension   Nurse Case Manager Clinical Goal(s):  [name] Over the next 90 days, patient will work with CCM team  to address needs related to support and education related to Hypertension   CCM RN CM Interventions:  09/24/19 call completed with patient  . Evaluation of current treatment plan related to Hypertension and patient's adherence to plan as established by provider . Reviewed and discussed elevated BP noted at last OV with PCP; Reiterated importance of restricting Sodium . Reviewed importance of taking prescribed antihypertensives exactly as prescribed without missed doses for best effectiveness  . Advised patient, providing education and rationale, to monitor blood pressure daily and record, calling the CCM team and or PCP for findings outside established parameters. Reiterated target BP <130/80 . Discussed plans with patient for ongoing care management follow up and provided patient with direct contact information for care management team  Patient Self Care Activities:  . Self administers medications as prescribed . Attends all scheduled provider appointments . Calls pharmacy for medication refills . Performs ADL's independently . Performs IADL's independently . Calls provider office for new concerns or questions  Please see past updates related to this goal by clicking on the "Past Updates" button in the selected goal      . "to receive my liver biopsy results soon" (pt-stated)       CARE PLAN ENTRY (see  longtitudinal plan of care for additional care plan information)  Current Barriers:  [name] Knowledge Deficits related to evaluation and treatment of suspected Liver mass . 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 Determined patient underwent a Liver biopsy on 09/23/19 and is awaiting the results . Discussed patient continues to have a functioning implanted port for IV access and blood draws . Encouraged patient to keep the CCM team and PCP informed of Oncology recommendations as appropriate . Active listening provided to patient and reiterated the role of the CCM team for support, education, care coordination and resources . Discussed plans with patient for ongoing care management follow up and provided patient with direct contact information for care management team  Patient Self Care Activities:  . Self administers medications as prescribed . Attends all scheduled provider appointments .  Calls pharmacy for medication refills . Performs ADL's independently . Performs IADL's independently . Calls provider office for new concerns or questions  Initial goal documentation        Patient verbalizes understanding of instructions provided today.   Telephone follow up appointment with care management team member scheduled for:  11/05/19  Barb Merino, RN, BSN, CCM Care Management Coordinator [place] [facility]  Direct Phone: [phone]

## 2019-09-27 LAB — SURGICAL PATHOLOGY

## 2019-09-28 DIAGNOSIS — I10 Essential (primary) hypertension: Secondary | ICD-10-CM | POA: Diagnosis not present

## 2019-09-28 DIAGNOSIS — Z6837 Body mass index (BMI) 37.0-37.9, adult: Secondary | ICD-10-CM

## 2019-09-28 DIAGNOSIS — I110 Hypertensive heart disease with heart failure: Secondary | ICD-10-CM | POA: Diagnosis not present

## 2019-09-28 DIAGNOSIS — Z9012 Acquired absence of left breast and nipple: Secondary | ICD-10-CM | POA: Diagnosis not present

## 2019-09-28 DIAGNOSIS — Z9181 History of falling: Secondary | ICD-10-CM | POA: Diagnosis not present

## 2019-09-28 DIAGNOSIS — I0981 Rheumatic heart failure: Secondary | ICD-10-CM | POA: Diagnosis not present

## 2019-09-28 DIAGNOSIS — E119 Type 2 diabetes mellitus without complications: Secondary | ICD-10-CM | POA: Diagnosis not present

## 2019-09-28 DIAGNOSIS — I5031 Acute diastolic (congestive) heart failure: Secondary | ICD-10-CM | POA: Diagnosis not present

## 2019-09-28 DIAGNOSIS — M160 Bilateral primary osteoarthritis of hip: Secondary | ICD-10-CM | POA: Diagnosis not present

## 2019-09-28 DIAGNOSIS — Z7984 Long term (current) use of oral hypoglycemic drugs: Secondary | ICD-10-CM | POA: Diagnosis not present

## 2019-09-28 DIAGNOSIS — F319 Bipolar disorder, unspecified: Secondary | ICD-10-CM

## 2019-09-28 DIAGNOSIS — C787 Secondary malignant neoplasm of liver and intrahepatic bile duct: Secondary | ICD-10-CM | POA: Diagnosis not present

## 2019-09-28 DIAGNOSIS — I083 Combined rheumatic disorders of mitral, aortic and tricuspid valves: Secondary | ICD-10-CM | POA: Diagnosis not present

## 2019-09-28 DIAGNOSIS — Z853 Personal history of malignant neoplasm of breast: Secondary | ICD-10-CM | POA: Diagnosis not present

## 2019-09-28 DIAGNOSIS — E669 Obesity, unspecified: Secondary | ICD-10-CM

## 2019-09-28 DIAGNOSIS — E785 Hyperlipidemia, unspecified: Secondary | ICD-10-CM | POA: Diagnosis not present

## 2019-09-29 MED ORDER — ACETAMINOPHEN 325 MG PO TABS
650.00 | ORAL_TABLET | ORAL | Status: DC
Start: ? — End: 2019-09-29

## 2019-09-30 ENCOUNTER — Telehealth: Payer: Self-pay

## 2019-09-30 ENCOUNTER — Ambulatory Visit: Payer: Self-pay

## 2019-09-30 ENCOUNTER — Telehealth: Payer: Self-pay | Admitting: Nurse Practitioner

## 2019-09-30 ENCOUNTER — Other Ambulatory Visit: Payer: Self-pay

## 2019-09-30 ENCOUNTER — Telehealth: Payer: Self-pay | Admitting: Oncology

## 2019-09-30 DIAGNOSIS — Z170 Estrogen receptor positive status [ER+]: Secondary | ICD-10-CM | POA: Diagnosis not present

## 2019-09-30 DIAGNOSIS — R079 Chest pain, unspecified: Secondary | ICD-10-CM | POA: Diagnosis not present

## 2019-09-30 DIAGNOSIS — Z794 Long term (current) use of insulin: Secondary | ICD-10-CM

## 2019-09-30 DIAGNOSIS — E1169 Type 2 diabetes mellitus with other specified complication: Secondary | ICD-10-CM | POA: Diagnosis not present

## 2019-09-30 DIAGNOSIS — I10 Essential (primary) hypertension: Secondary | ICD-10-CM

## 2019-09-30 DIAGNOSIS — C7981 Secondary malignant neoplasm of breast: Secondary | ICD-10-CM | POA: Diagnosis not present

## 2019-09-30 DIAGNOSIS — I6782 Cerebral ischemia: Secondary | ICD-10-CM | POA: Diagnosis not present

## 2019-09-30 DIAGNOSIS — C787 Secondary malignant neoplasm of liver and intrahepatic bile duct: Secondary | ICD-10-CM | POA: Diagnosis not present

## 2019-09-30 DIAGNOSIS — C50912 Malignant neoplasm of unspecified site of left female breast: Secondary | ICD-10-CM | POA: Diagnosis not present

## 2019-09-30 NOTE — Chronic Care Management (AMB) (Signed)
  Chronic Care Management   Follow Up Note   09/30/2019 Name: Kathy Maxwell MRN: [id number] DOB: February 27, 1956  Referred by: [name], FNP Reason for referral : Chronic Care Management (Chart Review and Addendum completed)   Kathy Maxwell is a 64 y.o. year old female who is a primary care patient of [name], [place]. The CCM team was consulted for assistance with chronic disease management and care coordination needs.    Review of patient status, including review of consultants reports, relevant laboratory and other test results, and collaboration with appropriate care team members and the patient's provider was performed as part of comprehensive patient evaluation and provision of chronic care management services.    Chart Addendum to 09/24/19 CCM RN CM patient follow up call.   Barb Merino, RN, BSN, CCM Care Management Coordinator [place] [facility]  Direct Phone: [phone]

## 2019-09-30 NOTE — Telephone Encounter (Signed)
Called patient to schedule hosp f/u no

## 2019-09-30 NOTE — Telephone Encounter (Signed)
Per sch msg, patient wants to cancel 3/19 appt. Cancelled appt. Called and left msg confirming cancellation

## 2019-10-01 DIAGNOSIS — Z9012 Acquired absence of left breast and nipple: Secondary | ICD-10-CM | POA: Diagnosis not present

## 2019-10-01 DIAGNOSIS — Z853 Personal history of malignant neoplasm of breast: Secondary | ICD-10-CM | POA: Diagnosis not present

## 2019-10-01 DIAGNOSIS — I0981 Rheumatic heart failure: Secondary | ICD-10-CM | POA: Diagnosis not present

## 2019-10-01 DIAGNOSIS — Z9181 History of falling: Secondary | ICD-10-CM | POA: Diagnosis not present

## 2019-10-01 DIAGNOSIS — I110 Hypertensive heart disease with heart failure: Secondary | ICD-10-CM | POA: Diagnosis not present

## 2019-10-01 DIAGNOSIS — E785 Hyperlipidemia, unspecified: Secondary | ICD-10-CM | POA: Diagnosis not present

## 2019-10-01 DIAGNOSIS — E119 Type 2 diabetes mellitus without complications: Secondary | ICD-10-CM | POA: Diagnosis not present

## 2019-10-01 DIAGNOSIS — Z7984 Long term (current) use of oral hypoglycemic drugs: Secondary | ICD-10-CM | POA: Diagnosis not present

## 2019-10-01 DIAGNOSIS — I5031 Acute diastolic (congestive) heart failure: Secondary | ICD-10-CM | POA: Diagnosis not present

## 2019-10-01 DIAGNOSIS — C787 Secondary malignant neoplasm of liver and intrahepatic bile duct: Secondary | ICD-10-CM | POA: Diagnosis not present

## 2019-10-01 DIAGNOSIS — I083 Combined rheumatic disorders of mitral, aortic and tricuspid valves: Secondary | ICD-10-CM | POA: Diagnosis not present

## 2019-10-01 DIAGNOSIS — M160 Bilateral primary osteoarthritis of hip: Secondary | ICD-10-CM | POA: Diagnosis not present

## 2019-10-02 ENCOUNTER — Telehealth: Payer: Self-pay

## 2019-10-02 DIAGNOSIS — E785 Hyperlipidemia, unspecified: Secondary | ICD-10-CM | POA: Diagnosis not present

## 2019-10-02 DIAGNOSIS — C787 Secondary malignant neoplasm of liver and intrahepatic bile duct: Secondary | ICD-10-CM | POA: Diagnosis not present

## 2019-10-02 DIAGNOSIS — I5031 Acute diastolic (congestive) heart failure: Secondary | ICD-10-CM | POA: Diagnosis not present

## 2019-10-02 DIAGNOSIS — Z7984 Long term (current) use of oral hypoglycemic drugs: Secondary | ICD-10-CM | POA: Diagnosis not present

## 2019-10-02 DIAGNOSIS — C7981 Secondary malignant neoplasm of breast: Secondary | ICD-10-CM | POA: Diagnosis not present

## 2019-10-02 DIAGNOSIS — E119 Type 2 diabetes mellitus without complications: Secondary | ICD-10-CM | POA: Diagnosis not present

## 2019-10-02 DIAGNOSIS — Z9012 Acquired absence of left breast and nipple: Secondary | ICD-10-CM | POA: Diagnosis not present

## 2019-10-02 DIAGNOSIS — Z9181 History of falling: Secondary | ICD-10-CM | POA: Diagnosis not present

## 2019-10-02 DIAGNOSIS — M160 Bilateral primary osteoarthritis of hip: Secondary | ICD-10-CM | POA: Diagnosis not present

## 2019-10-02 DIAGNOSIS — Z853 Personal history of malignant neoplasm of breast: Secondary | ICD-10-CM | POA: Diagnosis not present

## 2019-10-02 DIAGNOSIS — I083 Combined rheumatic disorders of mitral, aortic and tricuspid valves: Secondary | ICD-10-CM | POA: Diagnosis not present

## 2019-10-02 DIAGNOSIS — I110 Hypertensive heart disease with heart failure: Secondary | ICD-10-CM | POA: Diagnosis not present

## 2019-10-02 DIAGNOSIS — I0981 Rheumatic heart failure: Secondary | ICD-10-CM | POA: Diagnosis not present

## 2019-10-02 NOTE — Telephone Encounter (Signed)
Attempted to do TCM call pt stated she just had her first cancer treatment and is very tired and winded at the moment. She asked if we can call her back tomorrow after 1:00 she will try to complete the call then

## 2019-10-03 ENCOUNTER — Telehealth: Payer: Self-pay

## 2019-10-03 DIAGNOSIS — J811 Chronic pulmonary edema: Secondary | ICD-10-CM | POA: Diagnosis not present

## 2019-10-03 DIAGNOSIS — Z20822 Contact with and (suspected) exposure to covid-19: Secondary | ICD-10-CM | POA: Diagnosis not present

## 2019-10-03 DIAGNOSIS — K767 Hepatorenal syndrome: Secondary | ICD-10-CM | POA: Diagnosis not present

## 2019-10-03 DIAGNOSIS — E8770 Fluid overload, unspecified: Secondary | ICD-10-CM | POA: Diagnosis not present

## 2019-10-03 DIAGNOSIS — E7800 Pure hypercholesterolemia, unspecified: Secondary | ICD-10-CM | POA: Diagnosis not present

## 2019-10-03 DIAGNOSIS — N179 Acute kidney failure, unspecified: Secondary | ICD-10-CM | POA: Diagnosis not present

## 2019-10-03 DIAGNOSIS — R9431 Abnormal electrocardiogram [ECG] [EKG]: Secondary | ICD-10-CM | POA: Diagnosis not present

## 2019-10-03 DIAGNOSIS — Z515 Encounter for palliative care: Secondary | ICD-10-CM | POA: Diagnosis not present

## 2019-10-03 DIAGNOSIS — R0602 Shortness of breath: Secondary | ICD-10-CM | POA: Diagnosis not present

## 2019-10-03 DIAGNOSIS — E1142 Type 2 diabetes mellitus with diabetic polyneuropathy: Secondary | ICD-10-CM | POA: Diagnosis not present

## 2019-10-03 DIAGNOSIS — Z9181 History of falling: Secondary | ICD-10-CM | POA: Diagnosis not present

## 2019-10-03 DIAGNOSIS — I083 Combined rheumatic disorders of mitral, aortic and tricuspid valves: Secondary | ICD-10-CM | POA: Diagnosis not present

## 2019-10-03 DIAGNOSIS — I5031 Acute diastolic (congestive) heart failure: Secondary | ICD-10-CM | POA: Diagnosis not present

## 2019-10-03 DIAGNOSIS — D696 Thrombocytopenia, unspecified: Secondary | ICD-10-CM | POA: Diagnosis not present

## 2019-10-03 DIAGNOSIS — C50919 Malignant neoplasm of unspecified site of unspecified female breast: Secondary | ICD-10-CM | POA: Diagnosis not present

## 2019-10-03 DIAGNOSIS — E785 Hyperlipidemia, unspecified: Secondary | ICD-10-CM | POA: Diagnosis not present

## 2019-10-03 DIAGNOSIS — E1122 Type 2 diabetes mellitus with diabetic chronic kidney disease: Secondary | ICD-10-CM | POA: Diagnosis not present

## 2019-10-03 DIAGNOSIS — I130 Hypertensive heart and chronic kidney disease with heart failure and stage 1 through stage 4 chronic kidney disease, or unspecified chronic kidney disease: Secondary | ICD-10-CM | POA: Diagnosis not present

## 2019-10-03 DIAGNOSIS — N189 Chronic kidney disease, unspecified: Secondary | ICD-10-CM | POA: Diagnosis not present

## 2019-10-03 DIAGNOSIS — C7981 Secondary malignant neoplasm of breast: Secondary | ICD-10-CM | POA: Diagnosis not present

## 2019-10-03 DIAGNOSIS — I509 Heart failure, unspecified: Secondary | ICD-10-CM | POA: Diagnosis not present

## 2019-10-03 DIAGNOSIS — R7401 Elevation of levels of liver transaminase levels: Secondary | ICD-10-CM | POA: Diagnosis not present

## 2019-10-03 DIAGNOSIS — R188 Other ascites: Secondary | ICD-10-CM | POA: Diagnosis not present

## 2019-10-03 DIAGNOSIS — I0981 Rheumatic heart failure: Secondary | ICD-10-CM | POA: Diagnosis not present

## 2019-10-03 DIAGNOSIS — Z9012 Acquired absence of left breast and nipple: Secondary | ICD-10-CM | POA: Diagnosis not present

## 2019-10-03 DIAGNOSIS — R0902 Hypoxemia: Secondary | ICD-10-CM | POA: Diagnosis not present

## 2019-10-03 DIAGNOSIS — E119 Type 2 diabetes mellitus without complications: Secondary | ICD-10-CM | POA: Diagnosis not present

## 2019-10-03 DIAGNOSIS — C649 Malignant neoplasm of unspecified kidney, except renal pelvis: Secondary | ICD-10-CM | POA: Diagnosis not present

## 2019-10-03 DIAGNOSIS — Z853 Personal history of malignant neoplasm of breast: Secondary | ICD-10-CM | POA: Diagnosis not present

## 2019-10-03 DIAGNOSIS — Z66 Do not resuscitate: Secondary | ICD-10-CM | POA: Diagnosis not present

## 2019-10-03 DIAGNOSIS — C50912 Malignant neoplasm of unspecified site of left female breast: Secondary | ICD-10-CM | POA: Diagnosis not present

## 2019-10-03 DIAGNOSIS — C787 Secondary malignant neoplasm of liver and intrahepatic bile duct: Secondary | ICD-10-CM | POA: Diagnosis not present

## 2019-10-03 DIAGNOSIS — D649 Anemia, unspecified: Secondary | ICD-10-CM | POA: Diagnosis not present

## 2019-10-03 DIAGNOSIS — M160 Bilateral primary osteoarthritis of hip: Secondary | ICD-10-CM | POA: Diagnosis not present

## 2019-10-03 DIAGNOSIS — R109 Unspecified abdominal pain: Secondary | ICD-10-CM | POA: Diagnosis not present

## 2019-10-03 DIAGNOSIS — J90 Pleural effusion, not elsewhere classified: Secondary | ICD-10-CM | POA: Diagnosis not present

## 2019-10-03 DIAGNOSIS — Z7982 Long term (current) use of aspirin: Secondary | ICD-10-CM | POA: Diagnosis not present

## 2019-10-03 DIAGNOSIS — Z7984 Long term (current) use of oral hypoglycemic drugs: Secondary | ICD-10-CM | POA: Diagnosis not present

## 2019-10-03 DIAGNOSIS — D631 Anemia in chronic kidney disease: Secondary | ICD-10-CM | POA: Diagnosis not present

## 2019-10-03 DIAGNOSIS — I110 Hypertensive heart disease with heart failure: Secondary | ICD-10-CM | POA: Diagnosis not present

## 2019-10-03 DIAGNOSIS — R011 Cardiac murmur, unspecified: Secondary | ICD-10-CM | POA: Diagnosis not present

## 2019-10-03 DIAGNOSIS — J810 Acute pulmonary edema: Secondary | ICD-10-CM | POA: Diagnosis not present

## 2019-10-03 DIAGNOSIS — R8289 Other abnormal findings on cytological and histological examination of urine: Secondary | ICD-10-CM | POA: Diagnosis not present

## 2019-10-03 DIAGNOSIS — I5033 Acute on chronic diastolic (congestive) heart failure: Secondary | ICD-10-CM | POA: Diagnosis not present
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
## 2019-10-04 ENCOUNTER — Inpatient Hospital Stay: Payer: Medicare Other

## 2019-10-07 DIAGNOSIS — I0981 Rheumatic heart failure: Secondary | ICD-10-CM | POA: Diagnosis not present

## 2019-10-07 DIAGNOSIS — Z7984 Long term (current) use of oral hypoglycemic drugs: Secondary | ICD-10-CM | POA: Diagnosis not present

## 2019-10-07 DIAGNOSIS — I5031 Acute diastolic (congestive) heart failure: Secondary | ICD-10-CM | POA: Diagnosis not present

## 2019-10-07 DIAGNOSIS — M160 Bilateral primary osteoarthritis of hip: Secondary | ICD-10-CM | POA: Diagnosis not present

## 2019-10-07 DIAGNOSIS — I110 Hypertensive heart disease with heart failure: Secondary | ICD-10-CM | POA: Diagnosis not present

## 2019-10-07 DIAGNOSIS — Z9181 History of falling: Secondary | ICD-10-CM | POA: Diagnosis not present

## 2019-10-07 DIAGNOSIS — E785 Hyperlipidemia, unspecified: Secondary | ICD-10-CM | POA: Diagnosis not present

## 2019-10-07 DIAGNOSIS — I083 Combined rheumatic disorders of mitral, aortic and tricuspid valves: Secondary | ICD-10-CM | POA: Diagnosis not present

## 2019-10-07 DIAGNOSIS — Z9012 Acquired absence of left breast and nipple: Secondary | ICD-10-CM | POA: Diagnosis not present

## 2019-10-07 DIAGNOSIS — Z853 Personal history of malignant neoplasm of breast: Secondary | ICD-10-CM | POA: Diagnosis not present

## 2019-10-07 DIAGNOSIS — C787 Secondary malignant neoplasm of liver and intrahepatic bile duct: Secondary | ICD-10-CM | POA: Diagnosis not present

## 2019-10-07 DIAGNOSIS — E119 Type 2 diabetes mellitus without complications: Secondary | ICD-10-CM | POA: Diagnosis not present

## 2019-10-08 MED ORDER — GUAIFENESIN 100 MG/5ML PO SYRP
200.00 | ORAL_SOLUTION | ORAL | Status: DC
Start: ? — End: 2019-10-08

## 2019-10-08 MED ORDER — DEXTROSE 50 % IV SOLN
25.00 | INTRAVENOUS | Status: DC
Start: ? — End: 2019-10-08

## 2019-10-08 MED ORDER — PROCHLORPERAZINE MALEATE 5 MG PO TABS
10.00 | ORAL_TABLET | ORAL | Status: DC
Start: ? — End: 2019-10-08

## 2019-10-08 MED ORDER — POLYETHYLENE GLYCOL 3350 17 GM/SCOOP PO POWD
17.00 | ORAL | Status: DC
Start: 2019-10-11 — End: 2019-10-08

## 2019-10-08 MED ORDER — GABAPENTIN 100 MG PO CAPS
100.00 | ORAL_CAPSULE | ORAL | Status: DC
Start: 2019-10-10 — End: 2019-10-08

## 2019-10-08 MED ORDER — FUROSEMIDE 10 MG/ML IJ SOLN
80.00 | INTRAMUSCULAR | Status: DC
Start: 2019-10-08 — End: 2019-10-08

## 2019-10-08 MED ORDER — INSULIN LISPRO (1 UNIT DIAL) 100 UNIT/ML (KWIKPEN)
0.00 | PEN_INJECTOR | SUBCUTANEOUS | Status: DC
Start: 2019-10-10 — End: 2019-10-08

## 2019-10-08 MED ORDER — CALCIUM CARBONATE 1250 (500 CA) MG PO CHEW
CHEWABLE_TABLET | ORAL | Status: DC
Start: ? — End: 2019-10-08

## 2019-10-08 MED ORDER — HYDRALAZINE HCL 25 MG PO TABS
25.00 | ORAL_TABLET | ORAL | Status: DC
Start: 2019-10-10 — End: 2019-10-08

## 2019-10-08 MED ORDER — ATORVASTATIN CALCIUM 20 MG PO TABS
20.00 | ORAL_TABLET | ORAL | Status: DC
Start: 2019-10-10 — End: 2019-10-08

## 2019-10-08 MED ORDER — ACETAMINOPHEN 325 MG PO TABS
650.00 | ORAL_TABLET | ORAL | Status: DC
Start: ? — End: 2019-10-08

## 2019-10-08 MED ORDER — GLUCOSE 40 % PO GEL
ORAL | Status: DC
Start: ? — End: 2019-10-08

## 2019-10-08 MED ORDER — METOPROLOL SUCCINATE ER 25 MG PO TB24
25.00 | ORAL_TABLET | ORAL | Status: DC
Start: 2019-10-11 — End: 2019-10-08

## 2019-10-08 MED ORDER — ENOXAPARIN SODIUM 30 MG/0.3ML ~~LOC~~ SOLN
30.00 | SUBCUTANEOUS | Status: DC
Start: 2019-10-10 — End: 2019-10-08

## 2019-10-08 MED ORDER — ALBUTEROL SULFATE (2.5 MG/3ML) 0.083% IN NEBU
2.50 | INHALATION_SOLUTION | RESPIRATORY_TRACT | Status: DC
Start: ? — End: 2019-10-08

## 2019-10-08 MED ORDER — ASPIRIN 81 MG PO TBEC
81.00 | DELAYED_RELEASE_TABLET | ORAL | Status: DC
Start: 2019-10-11 — End: 2019-10-08

## 2019-10-08 MED ORDER — IPRATROPIUM-ALBUTEROL 0.5-2.5 (3) MG/3ML IN SOLN
3.00 | RESPIRATORY_TRACT | Status: DC
Start: ? — End: 2019-10-08

## 2019-10-08 MED ORDER — PANTOPRAZOLE SODIUM 40 MG PO TBEC
40.00 | DELAYED_RELEASE_TABLET | ORAL | Status: DC
Start: 2019-10-11 — End: 2019-10-08

## 2019-10-08 MED ORDER — HYDROCODONE-ACETAMINOPHEN 5-325 MG PO TABS
1.00 | ORAL_TABLET | ORAL | Status: DC
Start: ? — End: 2019-10-08

## 2019-10-08 MED ORDER — NITROGLYCERIN 0.4 MG SL SUBL
0.40 | SUBLINGUAL_TABLET | SUBLINGUAL | Status: DC
Start: ? — End: 2019-10-08

## 2019-10-08 MED ORDER — MELATONIN 3 MG PO TABS
3.00 | ORAL_TABLET | ORAL | Status: DC
Start: ? — End: 2019-10-08

## 2019-10-08 MED ORDER — SEMAGLUTIDE(0.25 OR 0.5MG/DOS) 2 MG/1.5ML ~~LOC~~ SOPN
0.25 | PEN_INJECTOR | SUBCUTANEOUS | Status: DC
Start: 2019-10-11 — End: 2019-10-08

## 2019-10-08 MED ORDER — GLUCAGON (RDNA) 1 MG IJ KIT
1.00 | PACK | INTRAMUSCULAR | Status: DC
Start: ? — End: 2019-10-08

## 2019-10-08 MED ORDER — INSULIN GLARGINE 100 UNIT/ML SOLOSTAR PEN
10.00 | PEN_INJECTOR | SUBCUTANEOUS | Status: DC
Start: 2019-10-10 — End: 2019-10-08

## 2019-10-08 MED ORDER — GENERIC EXTERNAL MEDICATION
Status: DC
Start: ? — End: 2019-10-08

## 2019-10-10 ENCOUNTER — Ambulatory Visit: Payer: Self-pay

## 2019-10-10 ENCOUNTER — Other Ambulatory Visit: Payer: Self-pay

## 2019-10-10 ENCOUNTER — Telehealth: Payer: Self-pay

## 2019-10-10 ENCOUNTER — Other Ambulatory Visit: Payer: Self-pay | Admitting: Cardiology

## 2019-10-10 DIAGNOSIS — E1169 Type 2 diabetes mellitus with other specified complication: Secondary | ICD-10-CM

## 2019-10-10 DIAGNOSIS — I10 Essential (primary) hypertension: Secondary | ICD-10-CM

## 2019-10-10 DIAGNOSIS — C778 Secondary and unspecified malignant neoplasm of lymph nodes of multiple regions: Secondary | ICD-10-CM

## 2019-10-10 DIAGNOSIS — Z794 Long term (current) use of insulin: Secondary | ICD-10-CM

## 2019-10-10 MED ORDER — RIFAXIMIN 550 MG PO TABS
550.00 | ORAL_TABLET | ORAL | Status: DC
Start: 2019-10-10 — End: 2019-10-10

## 2019-10-10 MED ORDER — LACTULOSE 10 GM/15ML PO SOLN
30.00 | ORAL | Status: DC
Start: 2019-10-10 — End: 2019-10-10

## 2019-10-10 MED ORDER — PNEUMOCOCCAL VAC POLYVALENT 25 MCG/0.5ML IJ INJ
0.50 | INJECTION | INTRAMUSCULAR | Status: DC
Start: ? — End: 2019-10-10

## 2019-10-10 MED ORDER — GENERIC EXTERNAL MEDICATION
Status: DC
Start: ? — End: 2019-10-10

## 2019-10-11 NOTE — Chronic Care Management (AMB) (Signed)
Chronic Care Management   Follow Up Note   10/10/2019 Name: Kathy Maxwell MRN: [id number] DOB: [birth year]  Referred by: [name], FNP Reason for referral : Chronic Care Management (Chart Review-IP status - [place])   Kathy Maxwell is a 64 y.o. year old female who is a primary care patient of [name], [place]. The CCM team was consulted for assistance with chronic disease management and care coordination needs.    Review of patient status, including review of consultants reports, relevant laboratory and other test results, and collaboration with appropriate care team members and the patient's provider was performed as part of comprehensive patient evaluation and provision of chronic care management services.    Per review of Chart, patient continues to be Inpatient at [facility] with the following update noted: Per Nephrology Consult for AKI: -elevated LFTs noted, lasix likely contributing, monitoring -creatinine previous admission 1.02 [3/10]-->1.46 [3/12] at discharge, noted contrasted studies that admission -creatinine on admission 1.74 [3/18] -->2.15-->2.32->2.44 [3/21]-->2.76->3.14-->3.33 -avoid nephrotoxins and renally dose medications -on lasix as below -monitor UOP->not measured -currently not on IVFs -UPCR 1.6 -FEUrea 54.8% -CT abdomen [3/18] no hydronephrosis noted, but with small renal vascular calcifications b/l -renal function worsening, but considering poor prognosis in setting of metastatic cancer, would not recommend RRT  Shortness of Breath -ECHO EF 55-60% -on Lasix 80mg  IVP BID->discussed with cardiology and RHC planned to gauge volume status  -small right pleural effusion seen on imaging  Metastatic Breast Cancer Stage IV,with mets to liver -undergoing palliative chemotherapy, last dose [3/17] -followed by Carter Kitten  Hypomagnesemia -mag 1.7-->2.0 -repleted -monitor   Per Cardiology  On exam she is not really volume overloaded, this is  consistent with right heart catheterization hemodynamics. I suggest to hold diuretics for now and consider gentle hydration. Her prognosis is very poor she may benefit from palliative care consultation     Outpatient Encounter Medications as of 10/10/2019  Medication Sig Note  . acetaminophen (TYLENOL) 500 MG tablet Take 1,000 mg by mouth every 6 (six) hours as needed for moderate pain.   [name] amLODipine (NORVASC) 10 MG tablet TAKE 1 TABLET BY MOUTH EVERY DAY (Patient taking differently: Take 10 mg by mouth daily. )   . aspirin EC 81 MG tablet Take 81 mg by mouth daily.   [name] buPROPion (WELLBUTRIN SR) 150 MG 12 hr tablet TAKE 1 TABLET BY MOUTH EVERY DAY (Patient taking differently: Take 150 mg by mouth daily. )   . cholecalciferol (VITAMIN D3) 25 MCG (1000 UT) tablet Take 1 tablet (1,000 Units total) by mouth daily.   [name] docusate sodium (COLACE) 100 MG capsule Take 100 mg by mouth daily as needed for mild constipation.   . fluticasone (FLONASE) 50 MCG/ACT nasal spray Place 1 spray into both nostrils daily as needed for allergies or rhinitis.   . furosemide (LASIX) 20 MG tablet Take 1 tablet (20 mg total) by mouth daily as needed (for feet swelling).   . gabapentin (NEURONTIN) 100 MG capsule TAKE 1 CAPSULE BY MOUTH 3 TIMES A DAY (Patient taking differently: Take 100 mg by mouth 3 (three) times daily. )   . hydrALAZINE (APRESOLINE) 50 MG tablet Take 50 mg by mouth in the morning and at bedtime.   [name] HYDROcodone-acetaminophen (NORCO/VICODIN) 5-325 MG tablet Take 1 tablet by mouth every 4 (four) hours as needed for moderate pain.   [name] lidocaine-prilocaine (EMLA) cream Apply topically as needed. Apply to port-a-cath one - two hours before accessing. (Patient taking  differently: Apply 1 application topically as needed (Apply to port-a-cath one - two hours before accessing.). )   . losartan (COZAAR) 100 MG tablet Take 1 tablet (100 mg total) by mouth daily.   . metFORMIN (GLUCOPHAGE) 850 MG tablet Take 1 tablet  (850 mg total) by mouth daily with breakfast.   . metoprolol succinate (TOPROL-XL) 25 MG 24 hr tablet TAKE 1 TABLET BY MOUTH EVERY DAY (Patient taking differently: Take 25 mg by mouth daily. )   . naproxen (NAPROSYN) 500 MG tablet TAKE 1 TABLET BY MOUTH TWICE A DAY AS NEEDED (Patient taking differently: Take 500 mg by mouth 2 (two) times daily as needed for moderate pain. )   . ondansetron (ZOFRAN) 8 MG tablet Take 1 tablet (8 mg total) by mouth every 8 (eight) hours as needed for nausea or vomiting.   Glory Rosebush VERIO test strip CHECK BLOOD SUGAR TWICE A DAY BEFORE BREAKFAST AND DINNER   . palbociclib (IBRANCE) 125 MG tablet Take 1 tablet (125 mg total) by mouth daily. Take for 21 days on, 7 days off, repeat every 28 days. 09/12/2019: Hasnt started  . pantoprazole (PROTONIX) 40 MG tablet Take 1 tablet (40 mg total) by mouth daily.   . polyethylene glycol (MIRALAX / GLYCOLAX) 17 g packet Take 17 g by mouth daily.   . potassium chloride (KLOR-CON) 10 MEQ tablet Take 10 mEq by mouth daily.   . rosuvastatin (CRESTOR) 20 MG tablet Take 1 tablet (20 mg total) by mouth daily. Please schedule overdue follow up visit for further refills. [phone]   . Semaglutide,0.25 or 0.5MG /DOS, (OZEMPIC, 0.25 OR 0.5 MG/DOSE,) 2 MG/1.5ML SOPN Inject 0.5 mg into the skin once a week. (Patient taking differently: Inject 0.5 mg into the skin every Friday. )    Facility-Administered Encounter Medications as of 10/10/2019  Medication  . sodium chloride 0.9 % injection 10 mL    Plan:   The care management team will continue to monitor for IP status/ discharge home and will follow up with the patient by telephone to assess for CCM needs.   Barb Merino, RN, BSN, CCM Care Management Coordinator [place] [facility]  Direct Phone: [phone]

## 2019-10-15 ENCOUNTER — Telehealth: Payer: Self-pay | Admitting: Nurse Practitioner

## 2019-10-15 ENCOUNTER — Ambulatory Visit: Payer: Self-pay

## 2019-10-15 DIAGNOSIS — Z794 Long term (current) use of insulin: Secondary | ICD-10-CM

## 2019-10-15 DIAGNOSIS — E1169 Type 2 diabetes mellitus with other specified complication: Secondary | ICD-10-CM

## 2019-10-15 DIAGNOSIS — C778 Secondary and unspecified malignant neoplasm of lymph nodes of multiple regions: Secondary | ICD-10-CM

## 2019-10-15 DIAGNOSIS — I10 Essential (primary) hypertension: Secondary | ICD-10-CM

## 2019-10-15 MED ORDER — HYDROMORPHONE HCL 2 MG/ML IJ SOLN
0.50 | INTRAMUSCULAR | Status: DC
Start: ? — End: 2019-10-15

## 2019-10-15 MED ORDER — FUROSEMIDE 10 MG/ML IJ SOLN
80.00 | INTRAMUSCULAR | Status: DC
Start: 2019-10-14 — End: 2019-10-15

## 2019-10-15 MED ORDER — GENERIC EXTERNAL MEDICATION
Status: DC
Start: ? — End: 2019-10-15

## 2019-10-15 MED ORDER — ACETAMINOPHEN 650 MG RE SUPP
650.00 | RECTAL | Status: DC
Start: ? — End: 2019-10-15

## 2019-10-15 MED ORDER — PANTOPRAZOLE SODIUM 40 MG IV SOLR
40.00 | INTRAVENOUS | Status: DC
Start: 2019-10-14 — End: 2019-10-15

## 2019-10-15 NOTE — Chronic Care Management (AMB) (Signed)
  Chronic Care Management   Outreach Note  10/15/2019 Name: Kathy Maxwell MRN: [id number] DOB: [birth year]  Referred by: [name], FNP Reason for referral : No chief complaint on file.   Received notification from primary care provider [name], FNP, patient expired on 10/05/2019. No further outreach on the part of the embedded care management team will be made.   Follow Up Plan: No further follow up required  Barb Merino, RN, BSN, CCM Care Management Coordinator [facility]  Direct Phone: [phone]

## 2019-10-15 NOTE — Telephone Encounter (Signed)
Called her sister Jon Billings expressing our condolences on the passing of [name].  Appreciative for the phone call.

## 2019-10-16 ENCOUNTER — Telehealth: Payer: Self-pay

## 2019-10-17 DEATH — deceased

## 2019-10-22 ENCOUNTER — Telehealth: Payer: Self-pay

## 2019-10-24 ENCOUNTER — Other Ambulatory Visit: Payer: Self-pay | Admitting: Cardiology

## 2019-10-28 ENCOUNTER — Other Ambulatory Visit: Payer: Self-pay | Admitting: Oncology

## 2019-10-29 ENCOUNTER — Other Ambulatory Visit: Payer: Self-pay | Admitting: Oncology

## 2019-10-31 ENCOUNTER — Other Ambulatory Visit: Payer: Medicare HMO

## 2019-10-31 ENCOUNTER — Ambulatory Visit: Payer: Medicare HMO | Admitting: Oncology

## 2019-10-31 ENCOUNTER — Inpatient Hospital Stay: Payer: Medicare Other

## 2019-11-05 ENCOUNTER — Telehealth: Payer: Self-pay

## 2019-12-18 ENCOUNTER — Ambulatory Visit: Payer: Medicare Other | Admitting: Nurse Practitioner

## 2020-06-18 ENCOUNTER — Encounter: Payer: Medicare Other | Admitting: Nurse Practitioner
